# Patient Record
Sex: Male | Born: 1955 | Race: Black or African American | Hispanic: No | Smoking: Former smoker
Health system: Southern US, Community
[De-identification: ages and names within clinical notes are randomized; demographics above are authoritative.]

## PROBLEM LIST (undated history)

## (undated) DIAGNOSIS — E785 Hyperlipidemia, unspecified: Secondary | ICD-10-CM

## (undated) DIAGNOSIS — K649 Unspecified hemorrhoids: Secondary | ICD-10-CM

## (undated) DIAGNOSIS — Z87891 Personal history of nicotine dependence: Secondary | ICD-10-CM

## (undated) DIAGNOSIS — I2129 ST elevation (STEMI) myocardial infarction involving other sites: Secondary | ICD-10-CM

## (undated) DIAGNOSIS — I059 Rheumatic mitral valve disease, unspecified: Secondary | ICD-10-CM

## (undated) DIAGNOSIS — I5020 Unspecified systolic (congestive) heart failure: Secondary | ICD-10-CM

## (undated) DIAGNOSIS — T148XXA Other injury of unspecified body region, initial encounter: Secondary | ICD-10-CM

## (undated) DIAGNOSIS — I351 Nonrheumatic aortic (valve) insufficiency: Secondary | ICD-10-CM

## (undated) DIAGNOSIS — I255 Ischemic cardiomyopathy: Secondary | ICD-10-CM

## (undated) DIAGNOSIS — I739 Peripheral vascular disease, unspecified: Secondary | ICD-10-CM

## (undated) DIAGNOSIS — I2510 Atherosclerotic heart disease of native coronary artery without angina pectoris: Secondary | ICD-10-CM

## (undated) DIAGNOSIS — F329 Major depressive disorder, single episode, unspecified: Secondary | ICD-10-CM

## (undated) DIAGNOSIS — Z86718 Personal history of other venous thrombosis and embolism: Secondary | ICD-10-CM

## (undated) DIAGNOSIS — M109 Gout, unspecified: Secondary | ICD-10-CM

## (undated) DIAGNOSIS — R0600 Dyspnea, unspecified: Secondary | ICD-10-CM

## (undated) DIAGNOSIS — N183 Chronic kidney disease, stage 3 unspecified: Secondary | ICD-10-CM

## (undated) DIAGNOSIS — F209 Schizophrenia, unspecified: Secondary | ICD-10-CM

## (undated) DIAGNOSIS — R011 Cardiac murmur, unspecified: Secondary | ICD-10-CM

## (undated) DIAGNOSIS — I959 Hypotension, unspecified: Secondary | ICD-10-CM

## (undated) DIAGNOSIS — I10 Essential (primary) hypertension: Secondary | ICD-10-CM

## (undated) DIAGNOSIS — F3289 Other specified depressive episodes: Secondary | ICD-10-CM

## (undated) DIAGNOSIS — K219 Gastro-esophageal reflux disease without esophagitis: Secondary | ICD-10-CM

## (undated) HISTORY — DX: Major depressive disorder, single episode, unspecified: F32.9

## (undated) HISTORY — DX: Hypotension, unspecified: I95.9

## (undated) HISTORY — DX: Peripheral vascular disease, unspecified: I73.9

## (undated) HISTORY — DX: Gastro-esophageal reflux disease without esophagitis: K21.9

## (undated) HISTORY — DX: Essential (primary) hypertension: I10

## (undated) HISTORY — DX: Chronic kidney disease, stage 3 unspecified: N18.30

## (undated) HISTORY — DX: Unspecified systolic (congestive) heart failure: I50.20

## (undated) HISTORY — DX: Personal history of other venous thrombosis and embolism: Z86.718

## (undated) HISTORY — DX: Chronic kidney disease, stage 3 (moderate): N18.3

## (undated) HISTORY — DX: Gout, unspecified: M10.9

## (undated) HISTORY — PX: EYE SURGERY: SHX253

## (undated) HISTORY — DX: Hyperlipidemia, unspecified: E78.5

## (undated) HISTORY — DX: Atherosclerotic heart disease of native coronary artery without angina pectoris: I25.10

## (undated) HISTORY — PX: OTHER SURGICAL HISTORY: SHX169

## (undated) HISTORY — DX: Nonrheumatic aortic (valve) insufficiency: I35.1

## (undated) HISTORY — DX: Unspecified hemorrhoids: K64.9

## (undated) HISTORY — DX: Schizophrenia, unspecified: F20.9

## (undated) HISTORY — DX: Personal history of nicotine dependence: Z87.891

## (undated) HISTORY — DX: Other specified depressive episodes: F32.89

## (undated) HISTORY — DX: Other injury of unspecified body region, initial encounter: T14.8XXA

## (undated) HISTORY — DX: ST elevation (STEMI) myocardial infarction involving other sites: I21.29

## (undated) HISTORY — DX: Rheumatic mitral valve disease, unspecified: I05.9

## (undated) HISTORY — PX: COLONOSCOPY: SHX174

---

## 2011-02-20 DIAGNOSIS — I219 Acute myocardial infarction, unspecified: Secondary | ICD-10-CM

## 2011-02-25 DIAGNOSIS — I2129 ST elevation (STEMI) myocardial infarction involving other sites: Secondary | ICD-10-CM

## 2011-02-25 HISTORY — DX: ST elevation (STEMI) myocardial infarction involving other sites: I21.29

## 2011-03-07 ENCOUNTER — Encounter: Payer: Self-pay | Admitting: Cardiovascular Disease

## 2011-03-10 ENCOUNTER — Ambulatory Visit (INDEPENDENT_AMBULATORY_CARE_PROVIDER_SITE_OTHER): Payer: Medicare Other | Admitting: Cardiovascular Disease

## 2011-03-10 ENCOUNTER — Encounter: Payer: Self-pay | Admitting: Cardiovascular Disease

## 2011-03-10 DIAGNOSIS — I2510 Atherosclerotic heart disease of native coronary artery without angina pectoris: Secondary | ICD-10-CM

## 2011-03-10 DIAGNOSIS — E785 Hyperlipidemia, unspecified: Secondary | ICD-10-CM

## 2011-03-10 DIAGNOSIS — I10 Essential (primary) hypertension: Secondary | ICD-10-CM

## 2011-03-10 MED ORDER — CLONIDINE HCL 0.1 MG/24HR TD PTWK: 1.0000 | MEDICATED_PATCH | TRANSDERMAL | Status: DC

## 2011-03-10 NOTE — Patient Instructions (Signed)
Follow up as scheduled. Decrease Clonidine patch to 0.1 mg--change patch once per week. Stop Zocor (simvastatin) Stop Levitra. Cardiac Rehab referral

## 2011-03-11 ENCOUNTER — Encounter: Payer: Self-pay | Admitting: Cardiovascular Disease

## 2011-03-11 DIAGNOSIS — I2510 Atherosclerotic heart disease of native coronary artery without angina pectoris: Secondary | ICD-10-CM | POA: Insufficient documentation

## 2011-03-11 DIAGNOSIS — I10 Essential (primary) hypertension: Secondary | ICD-10-CM | POA: Insufficient documentation

## 2011-03-11 DIAGNOSIS — I059 Rheumatic mitral valve disease, unspecified: Secondary | ICD-10-CM | POA: Insufficient documentation

## 2011-03-11 DIAGNOSIS — E785 Hyperlipidemia, unspecified: Secondary | ICD-10-CM | POA: Insufficient documentation

## 2011-03-11 NOTE — Assessment & Plan Note (Signed)
The patient had a recent myocardial infarction with late is an inpatient. I elected to treat him medically without proceeding with cardiac catheterization due to chronic kidney disease and late presentation. His ejection fraction was 40-45% with moderate mitral regurgitation. The plan is to try to maximize his heart failure medications. I would like to get him off clonidine if possible in order to allow up titration of his other medications. His clonidine patch will be decreased to 0.1 mg once a week. He would come back for followup in a month and we'll try to up titrate the dose of hydralazine and Imdur were. Continue metoprolol at the current dose. I instructed him not to use Levitra or any other similar medications as he is on long-acting nitroglycerin. His renal function actually improved before hospital discharge to a creatinine less than 2. Thus, I will likely evaluate him with a nuclear stress test in the near future to see if he has a high ischemic burden to justify proceeding with cardiac catheterization. In the meantime, I asked him to start cardiac rehabilitation.

## 2011-03-11 NOTE — Progress Notes (Signed)
HPI  This is a 56 year old male who is here today for a followup visit. He presented recently to [facility] with weakness and dyspnea. He had chest pain 3-4 days before presentation. He was found to have non-ST elevation myocardial infarction with a troponin around 10 with normal CK-MB indicating subacute myocardial injury. His creatinine was 2.5 which was slightly above his baseline. He is known to have chronic kidney disease. He had an echocardiogram done which showed an ejection fraction of 40-45% with inferior and inferolateral wall hypokinesis with moderate mitral regurgitation. Due to his late presentation and chronic kidney disease, I recommended medical therapy. He was on high-dose Aldactone on presentation which was decreased to 25 mg once daily due to worsening renal function. Overall, he has been doing reasonably well. He had few short episodes of chest tightness . He continues to complain of exertional dyspnea and fatigue.  Allergies  Allergen Reactions  . Amlodipine   . Benazepril   . Haloperidol And Related   . Omeprazole      Current Outpatient Prescriptions on File Prior to Visit  Medication Sig Dispense Refill  . ARIPiprazole (ABILIFY) 20 MG tablet Take 20 mg by mouth 2 (two) times daily.      [name] aspirin EC 81 MG tablet Take 81 mg by mouth 2 (two) times daily.       [name] buPROPion (WELLBUTRIN) 100 MG tablet Take 100 mg by mouth 2 (two) times daily.      [name] LORazepam (ATIVAN) 1 MG tablet Take 1 mg by mouth every morning. & 2 by mouth at night      . metoprolol (LOPRESSOR) 100 MG tablet Take 100 mg by mouth 2 (two) times daily.      [name] OLANZapine (ZYPREXA) 10 MG tablet Take 50 mg by mouth at bedtime.       . pantoprazole (PROTONIX) 40 MG tablet Take 40 mg by mouth 2 (two) times daily.      . pravastatin (PRAVACHOL) 40 MG tablet Take 40 mg by mouth at bedtime.      [name] spironolactone (ALDACTONE) 25 MG tablet Take 25 mg by mouth daily.         Past Medical History  Diagnosis  Date  . Acute myocardial infarction, subendocardial infarction, initial episode of care   . Other primary cardiomyopathies   . Acute posthemorrhagic anemia   . Esophageal reflux   . Depressive disorder, not elsewhere classified   . Gout, unspecified   . Unspecified schizophrenia, unspecified condition   . Encounter for long-term (current) use of other medications   . Personal history of tobacco use, presenting hazards to health   . Hypotension, unspecified   . Mitral valve disorders     moderate MR  . Diseases of tricuspid valve   . CKD (chronic kidney disease) stage 3, GFR 30-59 ml/min   . Coronary atherosclerosis of native coronary artery   . Hypertension   . Other and unspecified hyperlipidemia      History reviewed. No pertinent past surgical history.   History reviewed. No pertinent family history.   History   Social History  . Marital Status: Legally Separated    Spouse Name: N/A    Number of Children: N/A  . Years of Education: N/A   Occupational History  . Not on file.   Social History Main Topics  . Smoking status: Former Smoker -- 1.0 packs/day for 23 years    Types: Cigarettes    Quit date: 02/25/1996  .  Smokeless tobacco: Never Used   Comment: + 15 years of smoking  . Alcohol Use: No  . Drug Use: No  . Sexually Active: Not on file   Other Topics Concern  . Not on file   Social History Narrative  . No narrative on file     PHYSICAL EXAM   BP 112/72  Pulse 82  Ht 5\' 8"  (1.727 m)  Wt 191 lb (86.637 kg)  BMI 29.04 kg/m2  Constitutional: He is oriented to person, place, and time. He appears well-developed and well-nourished. No distress.  HENT: No nasal discharge.  Head: Normocephalic and atraumatic.  Eyes: Pupils are equal, round, and reactive to light. Right eye exhibits no discharge. Left eye exhibits no discharge.  Neck: Normal range of motion. Neck supple. No JVD present. No thyromegaly present.  Cardiovascular: Normal rate, regular  rhythm, normal heart sounds and intact distal pulses. Exam reveals no gallop and no friction rub.  No murmur heard.  Pulmonary/Chest: Effort normal and breath sounds normal. No stridor. No respiratory distress. He has no wheezes. He has no rales. He exhibits no tenderness.  Abdominal: Soft. Bowel sounds are normal. He exhibits no distension. There is no tenderness. There is no rebound and no guarding.  Musculoskeletal: Normal range of motion. He exhibits no edema and no tenderness.  Neurological: He is alert and oriented to person, place, and time. Coordination normal.  Skin: Skin is warm and dry. No rash noted. He is not diaphoretic. No erythema. No pallor.  Psychiatric: He has a normal mood and affect. His behavior is normal. Judgment and thought content normal.      ASSESSMENT AND PLAN

## 2011-03-11 NOTE — Assessment & Plan Note (Signed)
His blood pressure is somewhat on the low side. The dose of clonidine will be decreased as discussed above.

## 2011-03-11 NOTE — Assessment & Plan Note (Signed)
The patient is taking both simvastatin and pravastatin for unclear reasons. Today I will stop simvastatin and leave him on pravastatin. He will need a followup lipid profile to determine if more aggressive statin treatment is needed.

## 2011-03-13 ENCOUNTER — Encounter (HOSPITAL_COMMUNITY): Payer: Self-pay | Admitting: *Deleted

## 2011-03-13 ENCOUNTER — Other Ambulatory Visit: Payer: Self-pay

## 2011-03-13 ENCOUNTER — Emergency Department (HOSPITAL_COMMUNITY): Payer: Medicare Other

## 2011-03-13 ENCOUNTER — Emergency Department (HOSPITAL_COMMUNITY)
Admission: EM | Admit: 2011-03-13 | Discharge: 2011-03-13 | Disposition: A | Discharge status: Home / Self Care | Payer: Medicare Other | Attending: Emergency Medicine | Admitting: Emergency Medicine

## 2011-03-13 DIAGNOSIS — R5381 Other malaise: Secondary | ICD-10-CM | POA: Insufficient documentation

## 2011-03-13 DIAGNOSIS — N183 Chronic kidney disease, stage 3 unspecified: Secondary | ICD-10-CM | POA: Insufficient documentation

## 2011-03-13 DIAGNOSIS — K219 Gastro-esophageal reflux disease without esophagitis: Secondary | ICD-10-CM | POA: Insufficient documentation

## 2011-03-13 DIAGNOSIS — I129 Hypertensive chronic kidney disease with stage 1 through stage 4 chronic kidney disease, or unspecified chronic kidney disease: Secondary | ICD-10-CM | POA: Insufficient documentation

## 2011-03-13 DIAGNOSIS — I252 Old myocardial infarction: Secondary | ICD-10-CM | POA: Insufficient documentation

## 2011-03-13 DIAGNOSIS — R5383 Other fatigue: Secondary | ICD-10-CM | POA: Insufficient documentation

## 2011-03-13 DIAGNOSIS — I2510 Atherosclerotic heart disease of native coronary artery without angina pectoris: Secondary | ICD-10-CM | POA: Insufficient documentation

## 2011-03-13 DIAGNOSIS — I059 Rheumatic mitral valve disease, unspecified: Secondary | ICD-10-CM | POA: Insufficient documentation

## 2011-03-13 DIAGNOSIS — R079 Chest pain, unspecified: Secondary | ICD-10-CM | POA: Insufficient documentation

## 2011-03-13 LAB — CBC
HCT: 37.6 % — ABNORMAL LOW (ref 39.0–52.0)
MCHC: 32.2 g/dL (ref 30.0–36.0)
Platelets: 163 10*3/uL (ref 150–400)
RDW: 15.1 % (ref 11.5–15.5)
WBC: 9 10*3/uL (ref 4.0–10.5)

## 2011-03-13 LAB — POCT I-STAT TROPONIN I: Troponin i, poc: 0.06 ng/mL (ref 0.00–0.08)

## 2011-03-13 LAB — BASIC METABOLIC PANEL
Chloride: 103 mEq/L (ref 96–112)
GFR calc Af Amer: 43 mL/min — ABNORMAL LOW (ref >90)
GFR calc non Af Amer: 37 mL/min — ABNORMAL LOW (ref >90)
Potassium: 4 mEq/L (ref 3.5–5.1)
Sodium: 140 mEq/L (ref 135–145)

## 2011-03-13 NOTE — ED Provider Notes (Signed)
History     CSN: [id number]  Arrival date & time 03/13/11  1339   First MD Initiated Contact with Patient 03/13/11 1430      Chief Complaint  Patient presents with  . Chest Pain    (Consider location/radiation/quality/duration/timing/severity/associated sxs/prior treatment) Patient is a 56 y.o. male presenting with chest pain.  Chest Pain The chest pain began 5 - 7 days ago. Chest pain occurs intermittently. The chest pain is unchanged. At its most intense, the pain is at 2/10. The pain is currently at 0/10. The severity of the pain is moderate. The quality of the pain is described as dull. The pain does not radiate. Primary symptoms include fatigue. Pertinent negatives for primary symptoms include no syncope, no shortness of breath, no cough, no palpitations, no abdominal pain, no nausea, no vomiting and no dizziness.  Pertinent negatives for associated symptoms include no diaphoresis and no near-syncope. He tried nothing for the symptoms.    patient has had pain like this now for several days was actually admitted the end of the December with a non-STEMI MI at [facility] in [facility]. Patient was actually seen by his cardiologist on the 14th for similar complaint and was okayed to be dismissed home. Today's pain is any different patient is concerned because his intermittent chest pain continues to occur its substernal and nonradiating.  Past Medical History  Diagnosis Date  . Acute myocardial infarction, subendocardial infarction, initial episode of care   . Other primary cardiomyopathies   . Esophageal reflux   . Depressive disorder, not elsewhere classified   . Gout, unspecified   . Unspecified schizophrenia, unspecified condition   . Encounter for long-term (current) use of other medications   . Personal history of tobacco use, presenting hazards to health   . Hypotension, unspecified   . Mitral valve disorders     moderate MR  . CKD (chronic kidney disease)  stage 3, GFR 30-59 ml/min   . Coronary atherosclerosis of native coronary artery   . Hypertension   . Other and unspecified hyperlipidemia     History reviewed. No pertinent past surgical history.  No family history on file.  History  Substance Use Topics  . Smoking status: Former Smoker -- 1.0 packs/day for 23 years    Types: Cigarettes    Quit date: 02/25/1996  . Smokeless tobacco: Never Used   Comment: + 15 years of smoking  . Alcohol Use: No      Review of Systems  Constitutional: Positive for fatigue. Negative for diaphoresis.  HENT: Negative for congestion, neck pain and neck stiffness.   Respiratory: Negative for cough and shortness of breath.   Cardiovascular: Positive for chest pain. Negative for palpitations, syncope and near-syncope.  Gastrointestinal: Negative for nausea, vomiting and abdominal pain.  Genitourinary: Negative for dysuria.  Musculoskeletal: Negative for back pain.  Neurological: Negative for dizziness, syncope and headaches.  Hematological: Does not bruise/bleed easily.    Allergies  Amlodipine; Benazepril; Haloperidol and related; and Omeprazole  Home Medications   Current Outpatient Rx  Name Route Sig Dispense Refill  . AMLODIPINE BESYLATE 10 MG PO TABS Oral Take 10 mg by mouth daily.    . ARIPIPRAZOLE 20 MG PO TABS Oral Take 20 mg by mouth 2 (two) times daily.    . ASPIRIN EC 81 MG PO TBEC Oral Take 81 mg by mouth 2 (two) times daily.     . BUPROPION HCL 100 MG PO TABS Oral Take 100 mg by mouth  2 (two) times daily.    [name] CLONIDINE HCL 0.1 MG/24HR TD PTWK Transdermal Place 1 patch onto the skin every 7 (seven) days. Patient changes patch on Sunday    . HYDRALAZINE HCL 25 MG PO TABS Oral Take 25 mg by mouth 2 (two) times daily.    . ISOSORBIDE MONONITRATE ER 30 MG PO TB24 Oral Take 30 mg by mouth daily.    [name] LORAZEPAM 1 MG PO TABS Oral Take 1-2 mg by mouth 2 (two) times daily. Patient takes 1 tablet in the morning & 2 tablets  at night    .  METOPROLOL TARTRATE 100 MG PO TABS Oral Take 100 mg by mouth 2 (two) times daily.    [name] OLANZAPINE 10 MG PO TABS Oral Take 50 mg by mouth at bedtime.     [name] PANTOPRAZOLE SODIUM 40 MG PO TBEC Oral Take 40 mg by mouth 2 (two) times daily.    [name] PRAVASTATIN SODIUM 40 MG PO TABS Oral Take 40 mg by mouth at bedtime.    . SPIRONOLACTONE 25 MG PO TABS Oral Take 25 mg by mouth daily.      BP 122/85  Pulse 86  Temp(Src) 97.7 F (36.5 C) (Oral)  Resp 16  SpO2 100%  Physical Exam  Nursing note and vitals reviewed. Constitutional: He is oriented to person, place, and time. He appears well-developed and well-nourished. No distress.  HENT:  Head: Normocephalic and atraumatic.  Mouth/Throat: Oropharynx is clear and moist.  Eyes: Conjunctivae and EOM are normal. Pupils are equal, round, and reactive to light.  Neck: Normal range of motion. Neck supple.  Cardiovascular: Normal rate and regular rhythm.   Pulmonary/Chest: Effort normal and breath sounds normal. No respiratory distress.  Abdominal: Soft. Bowel sounds are normal. There is no tenderness.  Musculoskeletal: Normal range of motion. He exhibits no edema.  Neurological: He is alert and oriented to person, place, and time. No cranial nerve deficit. Coordination normal.  Skin: Skin is warm. No rash noted.    ED Course  Procedures (including critical care time)  Labs Reviewed  CBC - Abnormal; Notable for the following:    RBC 4.04 (*)    Hemoglobin 12.1 (*)    HCT 37.6 (*)    All other components within normal limits  BASIC METABOLIC PANEL - Abnormal; Notable for the following:    Glucose, Bld 121 (*)    Creatinine, Ser 1.95 (*)    GFR calc non Af Amer 37 (*)    GFR calc Af Amer 43 (*)    All other components within normal limits  POCT I-STAT TROPONIN I  I-STAT TROPONIN I   Dg Chest 2 View  03/13/2011  *RADIOLOGY REPORT*  Clinical Data: High blood pressure, heart attack, chest pain  CHEST - 2 VIEW  Comparison: None.  Findings: There  is mild cardiomegaly.  The mediastinum and pulmonary vasculature are within normal limits.  Both lungs are clear.  IMPRESSION: Mild cardiomegaly.  Original Report Authenticated By: Duayne Cal, M.D.    Date: 03/13/2011  Rate: 94  Rhythm: normal sinus rhythm  QRS Axis: normal  Intervals: normal  ST/T Wave abnormalities: nonspecific T wave changes  Conduction Disutrbances:none  Narrative Interpretation:   Old EKG Reviewed: none available    1. Chest pain       MDM   Patient discussed with [facility] cardiology. Patient was admitted the end of December with a non-STEMI MI at [facility]. He was just seen by his  cardiologist in this group on the 14th of this month. He was having chest pain mild like today at that time. Today's workup including chest x-ray cardiac marker an EKG without any acute changes. Today's chest pain is been less than 15 minutes intermittent and recurrent but no worse has been for the past few days. Cardiology will contact him for earlier followup than the currently scheduled they will see him in the next 2 days.        Mervin Kung, MD 03/13/11 [phone]

## 2011-03-13 NOTE — ED Notes (Signed)
Pt had light heart attack 4-5 weeks ago--no cath.  Pt sts that he has not been sleeping and not had some of his meds for 3 days.  Pt is here with left chest pain that is worse today.  No sob, nausea, and diaphoresis.

## 2011-03-13 NOTE — ED Notes (Signed)
EKG completed in triage.

## 2011-03-13 NOTE — ED Notes (Signed)
Patient states onset 2 days ago intermittent left sided chest pain continued today 2/10 achy pressure. Airway intact bilateral equal chest rise and fall. Resting comfortably on stretcher.  Patient states suppose to take medication for blood pressure however does not.  Ax4 Family members at bedside. Cardiac monitor applied upon arrival to Bed assignment,

## 2011-03-14 ENCOUNTER — Other Ambulatory Visit: Payer: Self-pay | Admitting: Cardiovascular Disease

## 2011-03-14 ENCOUNTER — Telehealth: Payer: Self-pay | Admitting: *Deleted

## 2011-03-14 DIAGNOSIS — R072 Precordial pain: Secondary | ICD-10-CM

## 2011-03-14 NOTE — Telephone Encounter (Signed)
[place] set for 03-17-2011 @ [facility] Checking percert

## 2011-03-14 NOTE — Telephone Encounter (Signed)
Pt has Medicare and T[employer]are, no precert required.

## 2011-03-17 DIAGNOSIS — R079 Chest pain, unspecified: Secondary | ICD-10-CM

## 2011-03-18 ENCOUNTER — Ambulatory Visit: Payer: Medicare Other | Admitting: Cardiovascular Disease

## 2011-03-21 ENCOUNTER — Encounter: Payer: Self-pay | Admitting: Cardiovascular Disease

## 2011-03-21 ENCOUNTER — Ambulatory Visit (INDEPENDENT_AMBULATORY_CARE_PROVIDER_SITE_OTHER): Payer: Medicare Other | Admitting: Cardiovascular Disease

## 2011-03-21 DIAGNOSIS — I10 Essential (primary) hypertension: Secondary | ICD-10-CM

## 2011-03-21 DIAGNOSIS — E785 Hyperlipidemia, unspecified: Secondary | ICD-10-CM

## 2011-03-21 DIAGNOSIS — I059 Rheumatic mitral valve disease, unspecified: Secondary | ICD-10-CM

## 2011-03-21 DIAGNOSIS — I2510 Atherosclerotic heart disease of native coronary artery without angina pectoris: Secondary | ICD-10-CM

## 2011-03-21 MED ORDER — NITROGLYCERIN 0.4 MG SL SUBL: 0.4000 mg | SUBLINGUAL_TABLET | SUBLINGUAL | Status: DC | PRN

## 2011-03-21 MED ORDER — ISOSORBIDE MONONITRATE ER 60 MG PO TB24: 60.0000 mg | ORAL_TABLET | ORAL | Status: DC

## 2011-03-21 NOTE — Assessment & Plan Note (Signed)
The patient had a recent myocardial infarction with late presentation. I elected to treat him medically without proceeding with cardiac catheterization due to chronic kidney disease and late presentation. His ejection fraction was 40-45% with moderate mitral regurgitation. The plan is to try to maximize his heart failure medications. The patient had recent emergency room visit for brief chest pain. I scheduled him for a pharmacologic nuclear stress test which was done and showed evidence of a large infarct in the left circumflex distribution with only minimal peri-infarct ischemia. There was also a small area of reversibility in the anterior wall. His ejection fraction was 45%. I think the risk of proceeding with cardiac catheterization outweighs the benefit at this time. I recommend continuing medical therapy. Today, I will stop clonidine and increase the dose of Imdur to 60 mg once daily. I would also provide him with sublingual nitroglycerin to use as needed. I again encouraged him to start cardiac rehabilitation.

## 2011-03-21 NOTE — Assessment & Plan Note (Signed)
Continue Pravastatin. He will need lipid and liver profile during next visit.

## 2011-03-21 NOTE — Patient Instructions (Signed)
Follow up as scheduled. Stop Clonidine. Increase Imdur (isosorbide) to 60 mg daily. You can take 2 of your 30 mg tablets until gone and then get new prescription filled for 60 mg tablets and take 1 tablet daily. Nitroglycerin 0.4 mg. Place one tablet under tongue every 5 minutes up to 3 doses as needed for chest pain. No more than 3 doses over a 15 minute period.

## 2011-03-21 NOTE — Assessment & Plan Note (Signed)
His blood pressure is well-controlled 

## 2011-03-21 NOTE — Progress Notes (Addendum)
HPI  This is a 56 year old man who is here today for a followup visit. The patient had a recent large myocardial infarction. Unfortunately, he was a late Programmer, applications. Due to that, I did not pursue cardiac catheterization especially in the setting of chronic kidney disease. He was treated medically. During his last visit, I decreased the dose of clonidine patch with the plan to slowly get him off this medication. He had one episode of fluttering in his heart with mild tightness. He was very anxious and went to the emergency room at [facility]. His cardiac enzymes were negative. He has not had any further chest pain. Overall he feels reasonably well. He seems to be suffering from depression. He has significant psychiatric history with schizophrenia and multiple previous admissions for this. I decided to pursue a nuclear stress testing to see if there is significant amount of ischemia to warrant proceeding with cardiac catheterization.  Allergies  Allergen Reactions  . Amlodipine   . Benazepril   . Haloperidol And Related   . Omeprazole      Current Outpatient Prescriptions on File Prior to Visit  Medication Sig Dispense Refill  . amLODipine (NORVASC) 10 MG tablet Take 10 mg by mouth daily.      . ARIPiprazole (ABILIFY) 20 MG tablet Take 20 mg by mouth 2 (two) times daily.      [name] aspirin EC 81 MG tablet Take 81 mg by mouth 2 (two) times daily.       [name] buPROPion (WELLBUTRIN) 100 MG tablet Take 200 mg by mouth 2 (two) times daily.       [name] LORazepam (ATIVAN) 1 MG tablet Patient takes 1 tablet in the morning & 2 tablets  at night      . metoprolol (LOPRESSOR) 100 MG tablet Take 100 mg by mouth 2 (two) times daily.      [name] OLANZapine (ZYPREXA) 10 MG tablet Take 10 mg by mouth every morning. And 50 mg at bedtime      . pantoprazole (PROTONIX) 40 MG tablet Take 40 mg by mouth 2 (two) times daily.      [name] spironolactone (ALDACTONE) 25 MG tablet Take 25 mg by mouth daily.      . pravastatin  (PRAVACHOL) 40 MG tablet Take 40 mg by mouth at bedtime.         Past Medical History  Diagnosis Date  . Acute myocardial infarction, subendocardial infarction, initial episode of care   . Other primary cardiomyopathies   . Esophageal reflux   . Depressive disorder, not elsewhere classified   . Gout, unspecified   . Unspecified schizophrenia, unspecified condition   . Encounter for long-term (current) use of other medications   . Personal history of tobacco use, presenting hazards to health   . Hypotension, unspecified   . Mitral valve disorders     moderate MR  . CKD (chronic kidney disease) stage 3, GFR 30-59 ml/min   . Coronary atherosclerosis of native coronary artery   . Hypertension   . Other and unspecified hyperlipidemia      History reviewed. No pertinent past surgical history.   History reviewed. No pertinent family history.   History   Social History  . Marital Status: Legally Separated    Spouse Name: N/A    Number of Children: N/A  . Years of Education: N/A   Occupational History  . Not on file.   Social History Main Topics  . Smoking status: Former Smoker -- 1.0 packs/day for  23 years    Types: Cigarettes    Quit date: 02/25/1996  . Smokeless tobacco: Never Used   Comment: + 15 years of smoking  . Alcohol Use: No  . Drug Use: No  . Sexually Active: Not on file   Other Topics Concern  . Not on file   Social History Narrative  . No narrative on file     PHYSICAL EXAM   BP 117/70  Pulse 87  Ht 5\' 8"  (1.727 m)  Wt 191 lb (86.637 kg)  BMI 29.04 kg/m2  Constitutional: He is oriented to person, place, and time. He appears well-developed and well-nourished. No distress.  HENT: No nasal discharge.  Head: Normocephalic and atraumatic.  Eyes: Pupils are equal, round, and reactive to light. Right eye exhibits no discharge. Left eye exhibits no discharge.  Neck: Normal range of motion. Neck supple. No JVD present. No thyromegaly present.    Cardiovascular: Normal rate, regular rhythm, normal heart sounds and intact distal pulses. Exam reveals no gallop and no friction rub.  No murmur heard.  Pulmonary/Chest: Effort normal and breath sounds normal. No stridor. No respiratory distress. He has no wheezes. He has no rales. He exhibits no tenderness.  Abdominal: Soft. Bowel sounds are normal. He exhibits no distension. There is no tenderness. There is no rebound and no guarding.  Musculoskeletal: Normal range of motion. He exhibits no edema and no tenderness.  Neurological: He is alert and oriented to person, place, and time. Coordination normal.  Skin: Skin is warm and dry. No rash noted. He is not diaphoretic. No erythema. No pallor.  Psychiatric: His behavior is normal. Judgment and thought content normal.        ASSESSMENT AND PLAN

## 2011-03-21 NOTE — Assessment & Plan Note (Signed)
Moderate ischemic MR. Continue medical therapy.

## 2011-04-10 ENCOUNTER — Ambulatory Visit: Payer: Medicare Other | Admitting: Cardiovascular Disease

## 2011-04-22 ENCOUNTER — Ambulatory Visit (INDEPENDENT_AMBULATORY_CARE_PROVIDER_SITE_OTHER): Payer: Medicare Other | Admitting: Cardiovascular Disease

## 2011-04-22 ENCOUNTER — Encounter: Payer: Self-pay | Admitting: Cardiovascular Disease

## 2011-04-22 DIAGNOSIS — I2510 Atherosclerotic heart disease of native coronary artery without angina pectoris: Secondary | ICD-10-CM

## 2011-04-22 DIAGNOSIS — I059 Rheumatic mitral valve disease, unspecified: Secondary | ICD-10-CM

## 2011-04-22 DIAGNOSIS — I10 Essential (primary) hypertension: Secondary | ICD-10-CM

## 2011-04-22 DIAGNOSIS — E785 Hyperlipidemia, unspecified: Secondary | ICD-10-CM

## 2011-04-22 NOTE — Assessment & Plan Note (Signed)
Continue Pravastatin. He gets his labs done at the [place]. recommend a target LDL of less than 70. I provided him with a copy of his most recent office note to take with him to his primary care physician at the [place].

## 2011-04-22 NOTE — Progress Notes (Signed)
HPI  This is a 56 year old man who is here today for followup visit. He has known history of coronary artery disease status post myocardial infarction. He is being treated medically and did not have cardiac catheterization done due to chronic kidney disease (creatinine around 2.5). He had a nuclear stress test done which showed mostly a fixed defect consistent with an infarct without major ischemia. He is now attending cardiac rehabilitation 3 times a week and reports improved symptoms of dyspnea. His chest pain also is rare now. During his last visit, I increased the dose of Imdur to 60 mg once daily.  Allergies  Allergen Reactions  . Amlodipine   . Benazepril   . Haloperidol And Related   . Omeprazole      Current Outpatient Prescriptions on File Prior to Visit  Medication Sig Dispense Refill  . amLODipine (NORVASC) 10 MG tablet Take 10 mg by mouth daily.      . ARIPiprazole (ABILIFY) 20 MG tablet Take 20 mg by mouth 2 (two) times daily.      [name] aspirin EC 81 MG tablet Take 81 mg by mouth 2 (two) times daily.       [name] buPROPion (WELLBUTRIN) 100 MG tablet Take 200 mg by mouth 2 (two) times daily.       . hydrALAZINE (APRESOLINE) 100 MG tablet Take 100 mg by mouth 2 (two) times daily.      [name] HYDROcodone-acetaminophen (VICODIN) 5-500 MG per tablet Take 1 tablet by mouth 2 (two) times daily as needed.      . isosorbide mononitrate (IMDUR) 60 MG 24 hr tablet Take 1 tablet (60 mg total) by mouth every morning.  30 tablet  11  . LORazepam (ATIVAN) 1 MG tablet Patient takes 1 tablet in the morning & 2 tablets  at night      . metoprolol (LOPRESSOR) 100 MG tablet Take 100 mg by mouth 2 (two) times daily.      . nitroGLYCERIN (NITROSTAT) 0.4 MG SL tablet Place 1 tablet (0.4 mg total) under the tongue every 5 (five) minutes as needed for chest pain.  25 tablet  3  . OLANZapine (ZYPREXA) 10 MG tablet Take 10 mg by mouth every morning. And 50 mg at bedtime      . pantoprazole (PROTONIX) 40 MG tablet Take  40 mg by mouth 2 (two) times daily.      . ranitidine (ZANTAC) 150 MG tablet Take 150 mg by mouth 2 (two) times daily as needed.      [name] spironolactone (ALDACTONE) 25 MG tablet Take 25 mg by mouth daily.      . pravastatin (PRAVACHOL) 40 MG tablet Take 40 mg by mouth at bedtime.         Past Medical History  Diagnosis Date  . Acute myocardial infarction, subendocardial infarction, initial episode of care   . Other primary cardiomyopathies   . Esophageal reflux   . Depressive disorder, not elsewhere classified   . Gout, unspecified   . Unspecified schizophrenia, unspecified condition   . Encounter for long-term (current) use of other medications   . Personal history of tobacco use, presenting hazards to health   . Hypotension, unspecified   . Mitral valve disorders     moderate MR  . CKD (chronic kidney disease) stage 3, GFR 30-59 ml/min   . Coronary atherosclerosis of native coronary artery   . Hypertension   . Other and unspecified hyperlipidemia      History reviewed. No pertinent past  surgical history.   History reviewed. No pertinent family history.   History   Social History  . Marital Status: Legally Separated    Spouse Name: N/A    Number of Children: N/A  . Years of Education: N/A   Occupational History  . Not on file.   Social History Main Topics  . Smoking status: Former Smoker -- 1.0 packs/day for 23 years    Types: Cigarettes    Quit date: 02/25/1996  . Smokeless tobacco: Never Used   Comment: + 15 years of smoking  . Alcohol Use: No  . Drug Use: No  . Sexually Active: Not on file   Other Topics Concern  . Not on file   Social History Narrative  . No narrative on file     PHYSICAL EXAM   BP 138/82  Pulse 72  Resp 18  Ht 5\' 8"  (1.727 m)  Wt 200 lb 6.4 oz (90.901 kg)  BMI 30.47 kg/m2  Constitutional: He is oriented to person, place, and time. He appears well-developed and well-nourished. No distress.  HENT: No nasal discharge.  Head:  Normocephalic and atraumatic.  Eyes: Pupils are equal and round. Right eye exhibits no discharge. Left eye exhibits no discharge.  Neck: Normal range of motion. Neck supple. No JVD present. No thyromegaly present.  Cardiovascular: Normal rate, regular rhythm, normal heart sounds and. Exam reveals no gallop and no friction rub. No murmur heard.  Pulmonary/Chest: Effort normal and breath sounds normal. No stridor. No respiratory distress. He has no wheezes. He has no rales. He exhibits no tenderness.  Abdominal: Soft. Bowel sounds are normal. He exhibits no distension. There is no tenderness. There is no rebound and no guarding.  Musculoskeletal: Normal range of motion. He exhibits no edema and no tenderness.  Neurological: He is alert and oriented to person, place, and time. Coordination normal.  Skin: Skin is warm and dry. No rash noted. He is not diaphoretic. No erythema. No pallor.  Psychiatric: He has a normal mood and affect. His behavior is normal. Judgment and thought content normal.        ASSESSMENT AND PLAN

## 2011-04-22 NOTE — Assessment & Plan Note (Signed)
The patient had a myocardial infarction with late presentation. I elected to treat him medically without proceeding with cardiac catheterization due to chronic kidney disease and late presentation. His ejection fraction was 40-45% with moderate mitral regurgitation. The plan is to try to maximize his medications. Continue current medical therapy. Continue cardiac rehabilitation. Coronary angiography will be kept as a last resort for refractory angina or recurrent ischemic events.

## 2011-04-22 NOTE — Assessment & Plan Note (Signed)
His blood pressure is now well controlled. Continue current medications.

## 2011-04-22 NOTE — Assessment & Plan Note (Signed)
Moderate ischemic MR. Continue medical therapy.

## 2011-04-22 NOTE — Patient Instructions (Signed)
Your physician recommends that you schedule a follow-up appointment in: 6 months. You will receive a reminder letter in the mail about 1-2  Months in advance reminding you to call and schedule your appointment. If you don't receive this letter, please contact our office.  Your physician recommends that you continue on your current medications as directed. Please refer to the Current Medication list given to you today.

## 2011-09-30 ENCOUNTER — Ambulatory Visit (INDEPENDENT_AMBULATORY_CARE_PROVIDER_SITE_OTHER): Payer: Medicare Other | Admitting: Cardiology

## 2011-09-30 ENCOUNTER — Encounter: Payer: Self-pay | Admitting: Cardiology

## 2011-09-30 VITALS — BP 114/70 | HR 72 | Ht 68.0 in | Wt 208.4 lb

## 2011-09-30 DIAGNOSIS — I2510 Atherosclerotic heart disease of native coronary artery without angina pectoris: Secondary | ICD-10-CM

## 2011-09-30 DIAGNOSIS — I10 Essential (primary) hypertension: Secondary | ICD-10-CM

## 2011-09-30 NOTE — Progress Notes (Signed)
HPI The patient presents for followup of her cardiomyopathy with presumed coronary artery disease. He had a late presentation for myocardial infarction in the past. However, he did not have a cardiac catheterization as he had renal insufficiency. He does have a mildly reduced ejection fraction of 45% which may be related to his long-standing hypertension. His last creatinine was 1.9 and he is followed by a nephrologist at the [facility]. He was being followed by Dr. Fletcher Anon.  This is his first visit with me.  Since his last visit he has done well. He denies any chest pressure, neck or arm discomfort. He has had no shortness of breath, PND or orthopnea. He has no weight gain or edema. He's not doing as much exercise as he should but he does push a lawnmower. With this he might get some calf discomfort but he denies any other acute cardiovascular symptoms.   Allergies  Allergen Reactions  . Amlodipine   . Benazepril   . Haloperidol And Related   . Omeprazole     Current Outpatient Prescriptions  Medication Sig Dispense Refill  . amLODipine (NORVASC) 10 MG tablet Take 10 mg by mouth daily.      . ARIPiprazole (ABILIFY) 20 MG tablet Take 20 mg by mouth 2 (two) times daily.      [name] aspirin EC 81 MG tablet Take 81 mg by mouth 2 (two) times daily.       [name] buPROPion (WELLBUTRIN) 100 MG tablet Take 200 mg by mouth 2 (two) times daily.       . hydrALAZINE (APRESOLINE) 100 MG tablet Take 100 mg by mouth 2 (two) times daily.      [name] HYDROcodone-acetaminophen (VICODIN) 5-500 MG per tablet Take 1 tablet by mouth 2 (two) times daily as needed.      . isosorbide mononitrate (IMDUR) 60 MG 24 hr tablet Take 1 tablet (60 mg total) by mouth every morning.  30 tablet  11  . LORazepam (ATIVAN) 1 MG tablet Patient takes 1 tablet in the morning & 2 tablets  at night      . metoprolol (LOPRESSOR) 100 MG tablet Take 100 mg by mouth 2 (two) times daily.      . nitroGLYCERIN (NITROSTAT) 0.4 MG SL tablet Place 1 tablet (0.4 mg  total) under the tongue every 5 (five) minutes as needed for chest pain.  25 tablet  3  . OLANZapine (ZYPREXA) 10 MG tablet Take 50 mg by mouth at bedtime. And 50 mg at bedtime      . pantoprazole (PROTONIX) 40 MG tablet Take 40 mg by mouth 2 (two) times daily.      . pravastatin (PRAVACHOL) 40 MG tablet Take 40 mg by mouth at bedtime.      . ranitidine (ZANTAC) 150 MG tablet Take 150 mg by mouth 2 (two) times daily as needed.      . sertraline (ZOLOFT) 100 MG tablet Take 100 mg by mouth daily.      . simvastatin (ZOCOR) 80 MG tablet Take 80 mg by mouth at bedtime.      [name] spironolactone (ALDACTONE) 25 MG tablet Take 25 mg by mouth daily.      . vardenafil (LEVITRA) 20 MG tablet Take 20 mg by mouth daily as needed.        Past Medical History  Diagnosis Date  . Acute myocardial infarction, subendocardial infarction, initial episode of care   . Other primary cardiomyopathies   . Esophageal reflux   . Depressive  disorder, not elsewhere classified   . Gout, unspecified   . Unspecified schizophrenia, unspecified condition   . Encounter for long-term (current) use of other medications   . Personal history of tobacco use, presenting hazards to health   . Hypotension, unspecified   . Mitral valve disorders     moderate MR  . CKD (chronic kidney disease) stage 3, GFR 30-59 ml/min   . Coronary atherosclerosis of native coronary artery   . Hypertension   . Other and unspecified hyperlipidemia     Past Surgical History  Procedure Date  . None     ROS:  As stated in the HPI and negative for all other systems.  PHYSICAL EXAM BP 114/70  Pulse 72  Ht 5\' 8"  (1.727 m)  Wt 208 lb 6.4 oz (94.53 kg)  BMI 31.69 kg/m2 GENERAL:  Well appearing HEENT:  Pupils equal round and reactive, fundi not visualized, oral mucosa unremarkable NECK:  No jugular venous distention, waveform within normal limits, carotid upstroke brisk and symmetric, no bruits, no thyromegaly LUNGS:  Clear to auscultation  bilaterally BACK:  No CVA tenderness CHEST:  Unremarkable HEART:  PMI not displaced or sustained,S1 and S2 within normal limits, no S3, no S4, no clicks, no rubs, no murmurs ABD:  Flat, positive bowel sounds normal in frequency in pitch, no bruits, no rebound, no guarding, no midline pulsatile mass, no hepatomegaly, no splenomegaly EXT:  2 plus pulses throughout, no edema, no cyanosis no clubbing   ASSESSMENT AND PLAN  Coronary atherosclerosis of native coronary artery -  The patient had a myocardial infarction with late presentation. He was managed medically without proceeding with cardiac catheterization due to chronic kidney disease and late presentation. His ejection fraction was 40-45% with moderate mitral regurgitation. At this point no change in therapy is indicated and no further imaging is indicated.  I reviewed using a heart model his ejection fraction and mitral regurgitation.   Hypertension -  His blood pressure is now well controlled. Continue current medications.  Mitral valve disorders -  Moderate ischemic MR. Continue medical therapy. I will image this probably again in one year.   Other and unspecified hyperlipidemia -  Continue Pravastatin. this is followed at the [place] with a goal LDL of 70.

## 2011-09-30 NOTE — Patient Instructions (Signed)
   Lab:  BMET today  Office will contact with results Your physician wants you to follow up in: 6 months.  You will receive a reminder letter in the mail one-two months in advance.  If you don't receive a letter, please call our office to schedule the follow up appointment

## 2011-10-07 ENCOUNTER — Telehealth: Payer: Self-pay | Admitting: *Deleted

## 2011-10-07 DIAGNOSIS — I10 Essential (primary) hypertension: Secondary | ICD-10-CM

## 2011-10-07 NOTE — Telephone Encounter (Signed)
Message copied by Merlene Laughter on Tue Oct 07, 2011 10:43 AM ------      Message from: Laurine Blazer      Created: Fri Oct 03, 2011  9:38 AM                   ----- Message -----         From: Ellwood Dense, RN         Sent: 10/02/2011   4:39 PM           To: Laurine Blazer, LPN                        ----- Message -----         From: Minus Breeding, MD         Sent: 10/02/2011  11:39 AM           To: Ellwood Dense, RN            Stop spironolactone.  Repeat BMET next week.

## 2011-10-07 NOTE — Telephone Encounter (Signed)
Patient informed and lab order faxed to [facility] lab.

## 2011-10-22 ENCOUNTER — Telehealth: Payer: Self-pay | Admitting: *Deleted

## 2011-10-22 DIAGNOSIS — I10 Essential (primary) hypertension: Secondary | ICD-10-CM

## 2011-10-22 NOTE — Telephone Encounter (Signed)
Patient informed. Lab order faxed to [facility].

## 2011-10-22 NOTE — Telephone Encounter (Signed)
Message copied by Merlene Laughter on Wed Oct 22, 2011  1:38 PM ------      Message from: Minus Breeding      Created: Mon Oct 20, 2011  1:31 PM       Repeat in one week.

## 2011-10-22 NOTE — Addendum Note (Signed)
Addended by: Merlene Laughter on: 10/22/2011 01:40 PM   Modules accepted: Orders

## 2011-10-30 ENCOUNTER — Telehealth: Payer: Self-pay | Admitting: *Deleted

## 2011-10-30 DIAGNOSIS — I10 Essential (primary) hypertension: Secondary | ICD-10-CM

## 2011-10-30 NOTE — Telephone Encounter (Signed)
Message copied by Merlene Laughter on Thu Oct 30, 2011  1:18 PM ------      Message from: Minus Breeding      Created: Tue Oct 28, 2011 11:34 AM       Creat is better than previous since spironolactone has been stopped.   Repeat in 6 weeks.  Call Mr. Laduca with the results and send results to VYAS,DHRUV B., MD

## 2011-10-30 NOTE — Telephone Encounter (Signed)
Patient informed. Copy sent to Dr. Woody Seller office. Orderd faxed to [facility] lab.

## 2011-11-13 ENCOUNTER — Telehealth: Payer: Self-pay | Admitting: Cardiovascular Disease

## 2011-12-30 ENCOUNTER — Telehealth: Payer: Self-pay | Admitting: *Deleted

## 2011-12-30 NOTE — Telephone Encounter (Signed)
Message copied by Merlene Laughter on Tue Dec 30, 2011 11:15 AM ------      Message from: Minus Breeding      Created: Mon Dec 29, 2011 11:35 AM       Creat is slightly better than previous.  Call Mr. Sieloff with the results and send results to VYAS,DHRUV B., MD

## 2011-12-30 NOTE — Telephone Encounter (Signed)
Patient informed. 

## 2012-04-13 ENCOUNTER — Other Ambulatory Visit: Payer: Self-pay | Admitting: *Deleted

## 2012-04-13 MED ORDER — ISOSORBIDE MONONITRATE ER 60 MG PO TB24: 60.0000 mg | ORAL_TABLET | ORAL | Status: DC

## 2012-04-23 ENCOUNTER — Ambulatory Visit (INDEPENDENT_AMBULATORY_CARE_PROVIDER_SITE_OTHER): Payer: Medicare Other | Admitting: Cardiology

## 2012-04-23 ENCOUNTER — Encounter: Payer: Self-pay | Admitting: Cardiology

## 2012-04-23 VITALS — BP 129/68 | HR 87 | Ht 68.0 in | Wt 220.0 lb

## 2012-04-23 DIAGNOSIS — I2510 Atherosclerotic heart disease of native coronary artery without angina pectoris: Secondary | ICD-10-CM

## 2012-04-23 DIAGNOSIS — E785 Hyperlipidemia, unspecified: Secondary | ICD-10-CM

## 2012-04-23 DIAGNOSIS — I25810 Atherosclerosis of coronary artery bypass graft(s) without angina pectoris: Secondary | ICD-10-CM

## 2012-04-23 DIAGNOSIS — I059 Rheumatic mitral valve disease, unspecified: Secondary | ICD-10-CM

## 2012-04-23 DIAGNOSIS — I10 Essential (primary) hypertension: Secondary | ICD-10-CM

## 2012-04-23 NOTE — Progress Notes (Signed)
HPI The patient presents for followup of her cardiomyopathy with presumed coronary artery disease. He had a late presentation for myocardial infarction in the past. However, he did not have a cardiac catheterization as he had renal insufficiency. He does have a mildly reduced ejection fraction of 45% which may be related to his long-standing hypertension.   Since his last visit he has done well. He denies any new chest pressure, neck or arm discomfort. He has had no shortness of breath, PND or orthopnea. He has no edema. He's not doing as much exercise. He has gained weight which he reports is related to his medications.  He is eating more as he is alone and separated from his wife.  He has had no new symptoms since his Myoview last year.   Allergies  Allergen Reactions  . Amlodipine   . Benazepril   . Haloperidol And Related   . Omeprazole     Current Outpatient Prescriptions  Medication Sig Dispense Refill  . amLODipine (NORVASC) 10 MG tablet Take 10 mg by mouth daily.      . ARIPiprazole (ABILIFY) 20 MG tablet Take 20 mg by mouth 2 (two) times daily.      [name] aspirin EC 81 MG tablet Take 81 mg by mouth 2 (two) times daily.       [name] buPROPion (WELLBUTRIN) 100 MG tablet Take 200 mg by mouth 2 (two) times daily.       . hydrALAZINE (APRESOLINE) 100 MG tablet Take 100 mg by mouth 2 (two) times daily.      [name] HYDROcodone-acetaminophen (VICODIN) 5-500 MG per tablet Take 1 tablet by mouth 2 (two) times daily as needed.      . isosorbide mononitrate (IMDUR) 60 MG 24 hr tablet Take 1 tablet (60 mg total) by mouth every morning.  30 tablet  6  . LORazepam (ATIVAN) 1 MG tablet Patient takes 1 tablet in the morning & 2 tablets  at night      . metoprolol (LOPRESSOR) 100 MG tablet Take 100 mg by mouth 2 (two) times daily.      . nitroGLYCERIN (NITROSTAT) 0.4 MG SL tablet Place 1 tablet (0.4 mg total) under the tongue every 5 (five) minutes as needed for chest pain.  25 tablet  3  . OLANZapine  (ZYPREXA) 10 MG tablet Take 50 mg by mouth at bedtime. And 50 mg at bedtime      . pantoprazole (PROTONIX) 40 MG tablet Take 40 mg by mouth 2 (two) times daily.      . pravastatin (PRAVACHOL) 40 MG tablet Take 20 mg by mouth at bedtime.       . ranitidine (ZANTAC) 150 MG tablet Take 150 mg by mouth 2 (two) times daily as needed.      . sertraline (ZOLOFT) 100 MG tablet Take 100 mg by mouth daily.      . simvastatin (ZOCOR) 80 MG tablet Take 80 mg by mouth at bedtime.      . vardenafil (LEVITRA) 20 MG tablet Take 20 mg by mouth daily as needed.       No current facility-administered medications for this visit.    Past Medical History  Diagnosis Date  . Acute myocardial infarction, subendocardial infarction, initial episode of care   . Other primary cardiomyopathies   . Esophageal reflux   . Depressive disorder, not elsewhere classified   . Gout, unspecified   . Unspecified schizophrenia, unspecified condition   . Encounter for long-term (current) use of  other medications   . Personal history of tobacco use, presenting hazards to health   . Hypotension, unspecified   . Mitral valve disorders     moderate MR  . CKD (chronic kidney disease) stage 3, GFR 30-59 ml/min   . Coronary atherosclerosis of native coronary artery   . Hypertension   . Other and unspecified hyperlipidemia     Past Surgical History  Procedure Laterality Date  . None      ROS:  As stated in the HPI and negative for all other systems.  PHYSICAL EXAM BP 129/68  Pulse 87  Ht 5\' 8"  (1.727 m)  Wt 220 lb (99.791 kg)  BMI 33.46 kg/m2 GENERAL:  Well appearing HEENT:  Pupils equal round and reactive, fundi not visualized, oral mucosa unremarkable NECK:  No jugular venous distention, waveform within normal limits, carotid upstroke brisk and symmetric, no bruits, no thyromegaly LUNGS:  Clear to auscultation bilaterally BACK:  No CVA tenderness CHEST:  Unremarkable HEART:  PMI not displaced or sustained,S1 and S2  within normal limits, no S3, no S4, no clicks, no rubs, no murmurs ABD:  Flat, positive bowel sounds normal in frequency in pitch, no bruits, no rebound, no guarding, no midline pulsatile mass, no hepatomegaly, no splenomegaly EXT:  2 plus pulses throughout, no edema, no cyanosis no clubbing  EKG:  NSR, rate 87 axis WNL, early transition, no acute ST T wave changes. 04/23/2012  ASSESSMENT AND PLAN  Coronary atherosclerosis of native coronary artery -  The patient had a myocardial infarction with late presentation. He was managed medically without proceeding with cardiac catheterization due to chronic kidney disease and late presentation. His ejection fraction was 40-45% with moderate mitral regurgitation by echo but higher by nuclear. He has had no new chest pain.  At this point no change in therapy is indicated and no further imaging is indicated.  I reviewed again using a heart model his ejection fraction and mitral regurgitation. He does not recall the previous conversation.  Hypertension -  His blood pressure is now well controlled. Continue current medications.  Mitral valve disorders -  Moderate ischemic MR. I will schedule a follow up echocardiogram.  Other and unspecified hyperlipidemia -  Continue Pravastatin. this is followed at the [place].  Obesity - He has gain 29 lbs in one year.  The patient understands the need to lose weight with diet and exercise. We have discussed specific strategies for this.

## 2012-04-23 NOTE — Patient Instructions (Addendum)

## 2012-05-13 ENCOUNTER — Other Ambulatory Visit (INDEPENDENT_AMBULATORY_CARE_PROVIDER_SITE_OTHER): Payer: Medicare Other

## 2012-05-13 ENCOUNTER — Other Ambulatory Visit: Payer: Self-pay

## 2012-05-13 DIAGNOSIS — I059 Rheumatic mitral valve disease, unspecified: Secondary | ICD-10-CM

## 2012-05-13 DIAGNOSIS — I2510 Atherosclerotic heart disease of native coronary artery without angina pectoris: Secondary | ICD-10-CM

## 2012-05-28 ENCOUNTER — Telehealth: Payer: Self-pay | Admitting: *Deleted

## 2012-05-28 NOTE — Telephone Encounter (Signed)
Patient informed. 

## 2012-05-28 NOTE — Telephone Encounter (Signed)
Message copied by Merlene Laughter on Fri May 28, 2012  4:14 PM ------      Message from: FLEMING, PAMELA J      Created: Wed May 26, 2012  3:51 PM                   ----- Message -----         From: Minus Breeding, MD         Sent: 05/23/2012   4:08 PM           To: Ellwood Dense, RN            EF is mildly reduced consistent with previous studies and previous history.  No decrease in function compared with previous.  MR is only mild.  No further work up at this point. Continue with secondary risk reduction. ------

## 2012-11-09 ENCOUNTER — Ambulatory Visit (INDEPENDENT_AMBULATORY_CARE_PROVIDER_SITE_OTHER): Payer: Medicare Other | Admitting: Cardiovascular Disease

## 2012-11-09 ENCOUNTER — Encounter: Payer: Self-pay | Admitting: Cardiovascular Disease

## 2012-11-09 VITALS — BP 127/77 | HR 78 | Ht 68.0 in | Wt 217.0 lb

## 2012-11-09 DIAGNOSIS — I10 Essential (primary) hypertension: Secondary | ICD-10-CM

## 2012-11-09 DIAGNOSIS — E785 Hyperlipidemia, unspecified: Secondary | ICD-10-CM

## 2012-11-09 DIAGNOSIS — I2510 Atherosclerotic heart disease of native coronary artery without angina pectoris: Secondary | ICD-10-CM

## 2012-11-09 DIAGNOSIS — I059 Rheumatic mitral valve disease, unspecified: Secondary | ICD-10-CM

## 2012-11-09 MED ORDER — ISOSORBIDE MONONITRATE ER 60 MG PO TB24: 60.0000 mg | ORAL_TABLET | ORAL | Status: DC

## 2012-11-09 NOTE — Assessment & Plan Note (Signed)
The patient had previous myocardial infarction with late presentation likely in the left circumflex distribution. He has been treated medically since then. Continue current medications. I explained to him that interaction between nitroglycerin and phosphodiesterase inhibitors. He had significant improvement in his symptoms after the addition of isosorbide and thus I'm hesitant to stop this medication.

## 2012-11-09 NOTE — Assessment & Plan Note (Signed)
I reviewed his most recent lipid profile which showed a total cholesterol of 213, triglyceride of 220 , HDL of 43 and an LDL of 128. I discussed with him the importance of improving his diet and exercise. Consider switching from simvastatin to atorvastatin to obtain an LDL level of less than 100.

## 2012-11-09 NOTE — Patient Instructions (Addendum)
Continue same medications.  Follow up in 6 months.  

## 2012-11-09 NOTE — Progress Notes (Signed)
HPI  This is a 57 year old man who is here today for followup visit. He has known history of coronary artery disease status post myocardial infarction with late presentation. He is being treated medically and did not have cardiac catheterization done due to chronic kidney disease (creatinine around 2.5) and late presentation. He had a nuclear stress test in January of 2013  showed mostly a large transmural infarct in the left circumflex distribution without significant ischemia. EF was 40-45% with moderate mitral regurgitation initially. Most recent echocardiogram in March 2014 showed an ejection fraction of 40% with mild mitral regurgitation. He has been stable on medical therapy. He denies any chest pain. He has stable exertional dyspnea. Weight gain continues to be an issue for him. Most recent creatinine was 2.6.  Allergies  Allergen Reactions  . Amlodipine   . Benazepril   . Haloperidol And Related   . Omeprazole      Current Outpatient Prescriptions on File Prior to Visit  Medication Sig Dispense Refill  . amLODipine (NORVASC) 10 MG tablet Take 10 mg by mouth daily.      . ARIPiprazole (ABILIFY) 20 MG tablet Take 20 mg by mouth 2 (two) times daily.      [name] aspirin EC 81 MG tablet Take 81 mg by mouth 2 (two) times daily.       [name] buPROPion (WELLBUTRIN) 100 MG tablet Take 200 mg by mouth 2 (two) times daily.       . hydrALAZINE (APRESOLINE) 100 MG tablet Take 100 mg by mouth 2 (two) times daily.      [name] HYDROcodone-acetaminophen (VICODIN) 5-500 MG per tablet Take 1 tablet by mouth 2 (two) times daily as needed.      . isosorbide mononitrate (IMDUR) 60 MG 24 hr tablet Take 1 tablet (60 mg total) by mouth every morning.  30 tablet  6  . LORazepam (ATIVAN) 1 MG tablet Patient takes 1 tablet in the morning & 2 tablets  at night      . metoprolol (LOPRESSOR) 100 MG tablet Take 100 mg by mouth 2 (two) times daily.      . nitroGLYCERIN (NITROSTAT) 0.4 MG SL tablet Place 1 tablet (0.4 mg total)  under the tongue every 5 (five) minutes as needed for chest pain.  25 tablet  3  . OLANZapine (ZYPREXA) 10 MG tablet Take 50 mg by mouth at bedtime. And 50 mg at bedtime      . pantoprazole (PROTONIX) 40 MG tablet Take 40 mg by mouth 2 (two) times daily.      . pravastatin (PRAVACHOL) 40 MG tablet Take 20 mg by mouth at bedtime.       . ranitidine (ZANTAC) 150 MG tablet Take 150 mg by mouth 2 (two) times daily as needed.      . sertraline (ZOLOFT) 100 MG tablet Take 100 mg by mouth daily.      . simvastatin (ZOCOR) 80 MG tablet Take 80 mg by mouth at bedtime.      . vardenafil (LEVITRA) 20 MG tablet Take 10 mg by mouth daily as needed.        No current facility-administered medications on file prior to visit.     Past Medical History  Diagnosis Date  . Acute myocardial infarction, subendocardial infarction, initial episode of care   . Other primary cardiomyopathies   . Esophageal reflux   . Depressive disorder, not elsewhere classified   . Gout, unspecified   . Unspecified schizophrenia, unspecified condition   .  Encounter for long-term (current) use of other medications   . Personal history of tobacco use, presenting hazards to health   . Hypotension, unspecified   . Mitral valve disorders     moderate MR  . CKD (chronic kidney disease) stage 3, GFR 30-59 ml/min   . Coronary atherosclerosis of native coronary artery   . Hypertension   . Other and unspecified hyperlipidemia      Past Surgical History  Procedure Laterality Date  . None    . Colonoscopy       Family History  Problem Relation Age of Onset  . Family history unknown: Yes     History   Social History  . Marital Status: Legally Separated    Spouse Name: N/A    Number of Children: N/A  . Years of Education: N/A   Occupational History  . Not on file.   Social History Main Topics  . Smoking status: Former Smoker -- 1.00 packs/day for 23 years    Types: Cigarettes    Quit date: 02/25/1996  .  Smokeless tobacco: Never Used     Comment: + 15 years of smoking  . Alcohol Use: No  . Drug Use: No  . Sexual Activity: Not on file   Other Topics Concern  . Not on file   Social History Narrative  . No narrative on file     PHYSICAL EXAM   BP 127/77  Pulse 78  Ht 5\' 8"  (1.727 m)  Wt 217 lb (98.431 kg)  BMI 33 kg/m2  Constitutional: He is oriented to person, place, and time. He appears well-developed and well-nourished. No distress.  HENT: No nasal discharge.  Head: Normocephalic and atraumatic.  Eyes: Pupils are equal and round. Right eye exhibits no discharge. Left eye exhibits no discharge.  Neck: Normal range of motion. Neck supple. No JVD present. No thyromegaly present.  Cardiovascular: Normal rate, regular rhythm, normal heart sounds and. Exam reveals no gallop and no friction rub. No murmur heard.  Pulmonary/Chest: Effort normal and breath sounds normal. No stridor. No respiratory distress. He has no wheezes. He has no rales. He exhibits no tenderness.  Abdominal: Soft. Bowel sounds are normal. He exhibits no distension. There is no tenderness. There is no rebound and no guarding.  Musculoskeletal: Normal range of motion. He exhibits no edema and no tenderness.  Neurological: He is alert and oriented to person, place, and time. Coordination normal.  Skin: Skin is warm and dry. No rash noted. He is not diaphoretic. No erythema. No pallor.  Psychiatric: He has a normal mood and affect. His behavior is normal. Judgment and thought content normal.     EKG: Normal sinus rhythm with LVH and nonspecific T wave changes.   ASSESSMENT AND PLAN

## 2012-11-09 NOTE — Assessment & Plan Note (Signed)
Most recent echo showed only mild mitral regurgitation.

## 2012-11-09 NOTE — Assessment & Plan Note (Signed)
Blood pressure is well controlled on current medications. 

## 2013-03-22 NOTE — Telephone Encounter (Signed)
No notes

## 2013-06-07 ENCOUNTER — Ambulatory Visit (INDEPENDENT_AMBULATORY_CARE_PROVIDER_SITE_OTHER): Payer: Medicare Other | Admitting: Cardiovascular Disease

## 2013-06-07 ENCOUNTER — Encounter: Payer: Self-pay | Admitting: Cardiovascular Disease

## 2013-06-07 ENCOUNTER — Encounter (INDEPENDENT_AMBULATORY_CARE_PROVIDER_SITE_OTHER): Payer: Self-pay

## 2013-06-07 VITALS — BP 134/82 | HR 80 | Ht 69.0 in | Wt 223.0 lb

## 2013-06-07 DIAGNOSIS — E785 Hyperlipidemia, unspecified: Secondary | ICD-10-CM

## 2013-06-07 DIAGNOSIS — I059 Rheumatic mitral valve disease, unspecified: Secondary | ICD-10-CM

## 2013-06-07 DIAGNOSIS — I2589 Other forms of chronic ischemic heart disease: Secondary | ICD-10-CM

## 2013-06-07 DIAGNOSIS — I10 Essential (primary) hypertension: Secondary | ICD-10-CM

## 2013-06-07 DIAGNOSIS — I255 Ischemic cardiomyopathy: Secondary | ICD-10-CM

## 2013-06-07 DIAGNOSIS — I2510 Atherosclerotic heart disease of native coronary artery without angina pectoris: Secondary | ICD-10-CM

## 2013-06-07 MED ORDER — ATORVASTATIN CALCIUM 40 MG PO TABS: 40.0000 mg | ORAL_TABLET | Freq: Every day | ORAL | Status: DC

## 2013-06-07 NOTE — Assessment & Plan Note (Signed)
Blood pressures and excellent control on current medications.

## 2013-06-07 NOTE — Assessment & Plan Note (Signed)
He is doing very well with no symptoms suggestive of angina. Continue medical therapy. 

## 2013-06-07 NOTE — Assessment & Plan Note (Signed)
Most recent echocardiogram showed mild mitral regurgitation.

## 2013-06-07 NOTE — Assessment & Plan Note (Signed)
Most recent lipid profile which showed a total cholesterol of 213, triglyceride of 220 , HDL of 43 and an LDL of 128.  I switched simvastatin to atorvastatin 40 mg once daily. Check fasting lipid and liver profile in 6 weeks.

## 2013-06-07 NOTE — Assessment & Plan Note (Signed)
Most recent ejection fraction was 40%. He has no symptoms of heart failure.

## 2013-06-07 NOTE — Progress Notes (Signed)
HPI  This is a 58 year old man who is here today for followup visit. He has known history of coronary artery disease status post myocardial infarction with late presentation. He is being treated medically and did not have cardiac catheterization done due to chronic kidney disease (creatinine around 2.5) and late presentation. He had a nuclear stress test in January of 2013 which showed mostly a large transmural infarct in the left circumflex distribution without significant ischemia. EF was 40-45% with moderate mitral regurgitation initially. Most recent echocardiogram in March 2014 showed an ejection fraction of 40% with mild mitral regurgitation. He has been stable on medical therapy. He denies any chest pain. He has stable exertional dyspnea.   Allergies  Allergen Reactions  . Amlodipine   . Benazepril   . Haloperidol And Related   . Omeprazole      Current Outpatient Prescriptions on File Prior to Visit  Medication Sig Dispense Refill  . amLODipine (NORVASC) 10 MG tablet Take 10 mg by mouth daily.      . ARIPiprazole (ABILIFY) 20 MG tablet Take 20 mg by mouth 2 (two) times daily.      [name] aspirin EC 81 MG tablet Take 81 mg by mouth 2 (two) times daily.       [name] buPROPion (WELLBUTRIN) 100 MG tablet Take 200 mg by mouth 2 (two) times daily.       . hydrALAZINE (APRESOLINE) 100 MG tablet Take 100 mg by mouth 2 (two) times daily.      [name] HYDROcodone-acetaminophen (VICODIN) 5-500 MG per tablet Take 1 tablet by mouth 2 (two) times daily as needed.      . isosorbide mononitrate (IMDUR) 60 MG 24 hr tablet Take 1 tablet (60 mg total) by mouth every morning.  30 tablet  6  . LORazepam (ATIVAN) 1 MG tablet Take 2 mg by mouth at bedtime.       . metoprolol (LOPRESSOR) 100 MG tablet Take 100 mg by mouth 2 (two) times daily.      . nitroGLYCERIN (NITROSTAT) 0.4 MG SL tablet Place 1 tablet (0.4 mg total) under the tongue every 5 (five) minutes as needed for chest pain.  25 tablet  3  . ranitidine  (ZANTAC) 150 MG tablet Take 150 mg by mouth 2 (two) times daily as needed.      . sertraline (ZOLOFT) 100 MG tablet Take 150 mg by mouth daily.        No current facility-administered medications on file prior to visit.     Past Medical History  Diagnosis Date  . Acute myocardial infarction, subendocardial infarction, initial episode of care   . Other primary cardiomyopathies   . Esophageal reflux   . Depressive disorder, not elsewhere classified   . Gout, unspecified   . Unspecified schizophrenia, unspecified condition   . Encounter for long-term (current) use of other medications   . Personal history of tobacco use, presenting hazards to health   . Hypotension, unspecified   . Mitral valve disorders     moderate MR  . CKD (chronic kidney disease) stage 3, GFR 30-59 ml/min   . Coronary atherosclerosis of native coronary artery   . Hypertension   . Other and unspecified hyperlipidemia   . Torn ligament      Past Surgical History  Procedure Laterality Date  . None    . Colonoscopy       Family History  Problem Relation Age of Onset  . Family history unknown: Yes  History   Social History  . Marital Status: Legally Separated    Spouse Name: N/A    Number of Children: N/A  . Years of Education: N/A   Occupational History  . Not on file.   Social History Main Topics  . Smoking status: Former Smoker -- 1.00 packs/day for 23 years    Types: Cigarettes    Quit date: 02/25/1996  . Smokeless tobacco: Never Used     Comment: + 15 years of smoking  . Alcohol Use: No  . Drug Use: No  . Sexual Activity: Not on file   Other Topics Concern  . Not on file   Social History Narrative  . No narrative on file     PHYSICAL EXAM   BP 134/82  Pulse 80  Ht 5\' 9"  (1.753 m)  Wt 223 lb (101.152 kg)  BMI 32.92 kg/m2  Constitutional: He is oriented to person, place, and time. He appears well-developed and well-nourished. No distress.  HENT: No nasal discharge.    Head: Normocephalic and atraumatic.  Eyes: Pupils are equal and round. Right eye exhibits no discharge. Left eye exhibits no discharge.  Neck: Normal range of motion. Neck supple. No JVD present. No thyromegaly present.  Cardiovascular: Normal rate, regular rhythm, normal heart sounds and. Exam reveals no gallop and no friction rub. No murmur heard.  Pulmonary/Chest: Effort normal and breath sounds normal. No stridor. No respiratory distress. He has no wheezes. He has no rales. He exhibits no tenderness.  Abdominal: Soft. Bowel sounds are normal. He exhibits no distension. There is no tenderness. There is no rebound and no guarding.  Musculoskeletal: Normal range of motion. He exhibits no edema and no tenderness.  Neurological: He is alert and oriented to person, place, and time. Coordination normal.  Skin: Skin is warm and dry. No rash noted. He is not diaphoretic. No erythema. No pallor.  Psychiatric: He has a normal mood and affect. His behavior is normal. Judgment and thought content normal.     EKG: Normal sinus rhythm with LVH and nonspecific T wave changes.   ASSESSMENT AND PLAN

## 2013-06-07 NOTE — Patient Instructions (Signed)
Stop taking Simvastatin.   Start Atorvastatin 40 mg once daily.   Check fasting lipid and liver profile in 6 weeks.   Your physician wants you to follow-up in: 6 months.  You will receive a reminder letter in the mail two months in advance. If you don't receive a letter, please call our office to schedule the follow-up appointment.

## 2013-07-04 ENCOUNTER — Telehealth: Payer: Self-pay

## 2013-07-04 ENCOUNTER — Other Ambulatory Visit: Payer: Self-pay | Admitting: Cardiovascular Disease

## 2013-07-04 MED ORDER — SIMVASTATIN 40 MG PO TABS: 40.0000 mg | ORAL_TABLET | Freq: Every day | ORAL | Status: DC

## 2013-07-04 NOTE — Telephone Encounter (Signed)
Informed patient of Dr. Jacklynn Ganong response Patient verbalized understanding

## 2013-07-04 NOTE — Telephone Encounter (Signed)
Pt called stating that his Lipitor is not agreeing with him. States that he is supposed to take it at 6pm and it makes him feel jittery.  Pt is unable to describe how it makes him feel, only "not good". Would like to know if there is another chol med he can take that won't make him feel so bad.  Please advise.  Thank you.

## 2013-07-04 NOTE — Telephone Encounter (Signed)
Switch him back to Simvastatin 40 mg once daily.

## 2013-07-12 ENCOUNTER — Telehealth: Payer: Self-pay | Admitting: *Deleted

## 2013-07-12 NOTE — Telephone Encounter (Signed)
[facility] to fax labs 5/19

## 2013-07-12 NOTE — Telephone Encounter (Signed)
Patient called and is wanting his lab results, please call.

## 2013-07-14 NOTE — Telephone Encounter (Signed)
Reviewed results with patient. 

## 2013-07-14 NOTE — Telephone Encounter (Signed)
Pt requests his results be called to this different contact #

## 2013-07-14 NOTE — Telephone Encounter (Signed)
Message copied by Tracie Harrier on Thu Jul 14, 2013  5:02 PM ------      Message from: Kathlyn Sacramento A      Created: Thu Jul 14, 2013  4:33 PM       Liver profile was normal.       Cholesterol improved on Atorvastatin. However, he said that it did not agree with him so he is back on Simvastatin .                   ----- Message -----         From: Tracie Harrier, RN         Sent: 07/14/2013  10:38 AM           To: Wellington Hampshire, MD            Patients labs were sent from [facility] scanned then and for some reason they did not come to triage        They are scanned in under labs in his chart        He wanted to know if you could take a look at them                Thanks!                      ------

## 2013-10-12 ENCOUNTER — Encounter (INDEPENDENT_AMBULATORY_CARE_PROVIDER_SITE_OTHER): Payer: Medicare Other | Admitting: Ophthalmology

## 2013-10-12 DIAGNOSIS — H43819 Vitreous degeneration, unspecified eye: Secondary | ICD-10-CM

## 2013-10-12 DIAGNOSIS — H35039 Hypertensive retinopathy, unspecified eye: Secondary | ICD-10-CM

## 2013-10-12 DIAGNOSIS — H33009 Unspecified retinal detachment with retinal break, unspecified eye: Secondary | ICD-10-CM

## 2013-10-12 DIAGNOSIS — H33309 Unspecified retinal break, unspecified eye: Secondary | ICD-10-CM

## 2013-10-12 DIAGNOSIS — I10 Essential (primary) hypertension: Secondary | ICD-10-CM

## 2013-12-08 ENCOUNTER — Ambulatory Visit (INDEPENDENT_AMBULATORY_CARE_PROVIDER_SITE_OTHER): Payer: Medicare Other | Admitting: Cardiovascular Disease

## 2013-12-08 ENCOUNTER — Encounter: Payer: Self-pay | Admitting: Cardiovascular Disease

## 2013-12-08 VITALS — BP 130/83 | HR 80 | Ht 69.0 in | Wt 224.0 lb

## 2013-12-08 DIAGNOSIS — E785 Hyperlipidemia, unspecified: Secondary | ICD-10-CM

## 2013-12-08 DIAGNOSIS — I5022 Chronic systolic (congestive) heart failure: Secondary | ICD-10-CM

## 2013-12-08 DIAGNOSIS — I059 Rheumatic mitral valve disease, unspecified: Secondary | ICD-10-CM

## 2013-12-08 DIAGNOSIS — I255 Ischemic cardiomyopathy: Secondary | ICD-10-CM

## 2013-12-08 DIAGNOSIS — I2510 Atherosclerotic heart disease of native coronary artery without angina pectoris: Secondary | ICD-10-CM

## 2013-12-08 DIAGNOSIS — I10 Essential (primary) hypertension: Secondary | ICD-10-CM

## 2013-12-08 NOTE — Progress Notes (Signed)
HPI  This is a 58 year old man who is here today for followup visit. He has known history of coronary artery disease status post myocardial infarction with late presentation. He is being treated medically and did not have cardiac catheterization done due to chronic kidney disease (creatinine around 2.5) and late presentation. He had a nuclear stress test in January of 2013 which showed mostly a large transmural infarct in the left circumflex distribution without significant ischemia. EF was 40-45% with moderate mitral regurgitation initially. Most recent echocardiogram in March 2014 showed an ejection fraction of 40% with mild mitral regurgitation. He has been stable on medical therapy. He denies any chest pain. He has stable exertional dyspnea. He has gained weight since last visit.  Allergies  Allergen Reactions  . Nifedipine Diarrhea  . Other     Other reaction(s): Other (See Comments) Hydal, stiffness in joints  . Amlodipine   . Benazepril   . Haloperidol And Related   . Omeprazole      Current Outpatient Prescriptions on File Prior to Visit  Medication Sig Dispense Refill  . amLODipine (NORVASC) 10 MG tablet Take 10 mg by mouth daily.      . ARIPiprazole (ABILIFY) 20 MG tablet Take 20 mg by mouth 2 (two) times daily.      [name] aspirin EC 81 MG tablet Take 81 mg by mouth 2 (two) times daily.       [name] buPROPion (WELLBUTRIN) 100 MG tablet Take 200 mg by mouth 2 (two) times daily.       . hydrALAZINE (APRESOLINE) 100 MG tablet Take 100 mg by mouth 2 (two) times daily.      . isosorbide mononitrate (IMDUR) 60 MG 24 hr tablet take 1 tablet by mouth every morning  30 tablet  6  . LORazepam (ATIVAN) 1 MG tablet Take by mouth. Takes 1 tablet am and 2 tablets pm daily.      . metoprolol (LOPRESSOR) 100 MG tablet Take 200 mg by mouth 2 (two) times daily.       . nitroGLYCERIN (NITROSTAT) 0.4 MG SL tablet Place 1 tablet (0.4 mg total) under the tongue every 5 (five) minutes as needed for chest  pain.  25 tablet  3  . ranitidine (ZANTAC) 150 MG tablet Take 150 mg by mouth 2 (two) times daily as needed.      . sertraline (ZOLOFT) 100 MG tablet Take 150 mg by mouth daily.       . simvastatin (ZOCOR) 40 MG tablet Take 1 tablet (40 mg total) by mouth at bedtime.  90 tablet  3  . Tadalafil (CIALIS PO) Take by mouth as needed.       No current facility-administered medications on file prior to visit.     Past Medical History  Diagnosis Date  . Acute myocardial infarction, subendocardial infarction, initial episode of care   . Other primary cardiomyopathies   . Esophageal reflux   . Depressive disorder, not elsewhere classified   . Gout, unspecified   . Unspecified schizophrenia, unspecified condition   . Encounter for long-term (current) use of other medications   . Personal history of tobacco use, presenting hazards to health   . Hypotension, unspecified   . Mitral valve disorders     moderate MR  . CKD (chronic kidney disease) stage 3, GFR 30-59 ml/min   . Coronary atherosclerosis of native coronary artery   . Hypertension   . Other and unspecified hyperlipidemia   . Torn ligament  Past Surgical History  Procedure Laterality Date  . None    . Colonoscopy    . Eye surgery       History reviewed. No pertinent family history.   History   Social History  . Marital Status: Legally Separated    Spouse Name: N/A    Number of Children: N/A  . Years of Education: N/A   Occupational History  . Not on file.   Social History Main Topics  . Smoking status: Former Smoker -- 1.00 packs/day for 23 years    Types: Cigarettes    Quit date: 02/25/1996  . Smokeless tobacco: Never Used     Comment: + 15 years of smoking  . Alcohol Use: No  . Drug Use: No  . Sexual Activity: Not on file   Other Topics Concern  . Not on file   Social History Narrative  . No narrative on file     PHYSICAL EXAM   BP 130/83  Pulse 80  Ht 5\' 9"  (1.753 m)  Wt 224 lb (101.606  kg)  BMI 33.06 kg/m2  Constitutional: He is oriented to person, place, and time. He appears well-developed and well-nourished. No distress.  HENT: No nasal discharge.  Head: Normocephalic and atraumatic.  Eyes: Pupils are equal and round. Right eye exhibits no discharge. Left eye exhibits no discharge.  Neck: Normal range of motion. Neck supple. No JVD present. No thyromegaly present.  Cardiovascular: Normal rate, regular rhythm, normal heart sounds and. Exam reveals no gallop and no friction rub. No murmur heard.  Pulmonary/Chest: Effort normal and breath sounds normal. No stridor. No respiratory distress. He has no wheezes. He has no rales. He exhibits no tenderness.  Abdominal: Soft. Bowel sounds are normal. He exhibits no distension. There is no tenderness. There is no rebound and no guarding.  Musculoskeletal: Normal range of motion. He exhibits no edema and no tenderness.  Neurological: He is alert and oriented to person, place, and time. Coordination normal.  Skin: Skin is warm and dry. No rash noted. He is not diaphoretic. No erythema. No pallor.  Psychiatric: He has a normal mood and affect. His behavior is normal. Judgment and thought content normal.     EKG: Sinus  Rhythm  -  Negative T-waves  -Possible  Anterior  ischemia.   ABNORMAL     ASSESSMENT AND PLAN

## 2013-12-08 NOTE — Patient Instructions (Signed)
Your physician wants you to follow-up in: 6 months with Dr. Fletcher Anon. You will receive a reminder letter in the mail two months in advance. If you don't receive a letter, please call our office to schedule the follow-up appointment.  Your next appointment will be scheduled in our new office located at :  [place]  [address], [place]  [place], [place] [address]

## 2013-12-09 DIAGNOSIS — I5022 Chronic systolic (congestive) heart failure: Secondary | ICD-10-CM | POA: Insufficient documentation

## 2013-12-09 NOTE — Assessment & Plan Note (Signed)
He has no symptoms of angina. Continue medical therapy. 

## 2013-12-09 NOTE — Assessment & Plan Note (Signed)
Continue treatment with simvastatin. He did not tolerate atorvastatin.

## 2013-12-09 NOTE — Assessment & Plan Note (Signed)
Blood pressure is well controlled on current medications. 

## 2013-12-09 NOTE — Assessment & Plan Note (Signed)
He appears to be euvolemic. Currently New [place] Heart Association class II. Blood pressure is reasonably controlled and he is on optimal medical therapy. Most recent ejection fraction was 40%. I did discuss with him the contraindication of nitroglycerin and phosphodiesterase inhibitors including Cialis. He knows not to take these together. When he uses Cialis he uses a very small dose.

## 2014-02-08 ENCOUNTER — Other Ambulatory Visit: Payer: Self-pay

## 2014-02-08 MED ORDER — ISOSORBIDE MONONITRATE ER 60 MG PO TB24: 60.0000 mg | ORAL_TABLET | Freq: Every morning | ORAL | Status: DC

## 2014-07-19 ENCOUNTER — Other Ambulatory Visit: Payer: Self-pay | Admitting: *Deleted

## 2014-07-19 MED ORDER — ISOSORBIDE MONONITRATE ER 60 MG PO TB24: 60.0000 mg | ORAL_TABLET | Freq: Every morning | ORAL | Status: DC

## 2014-07-28 ENCOUNTER — Encounter: Payer: Self-pay | Admitting: Cardiovascular Disease

## 2014-07-28 ENCOUNTER — Ambulatory Visit (INDEPENDENT_AMBULATORY_CARE_PROVIDER_SITE_OTHER): Payer: Medicare Other | Admitting: Cardiovascular Disease

## 2014-07-28 VITALS — BP 142/84 | HR 78 | Ht 69.0 in | Wt 226.5 lb

## 2014-07-28 DIAGNOSIS — E785 Hyperlipidemia, unspecified: Secondary | ICD-10-CM

## 2014-07-28 DIAGNOSIS — I255 Ischemic cardiomyopathy: Secondary | ICD-10-CM

## 2014-07-28 DIAGNOSIS — I10 Essential (primary) hypertension: Secondary | ICD-10-CM

## 2014-07-28 DIAGNOSIS — I5022 Chronic systolic (congestive) heart failure: Secondary | ICD-10-CM

## 2014-07-28 DIAGNOSIS — I2510 Atherosclerotic heart disease of native coronary artery without angina pectoris: Secondary | ICD-10-CM | POA: Diagnosis not present

## 2014-07-28 DIAGNOSIS — I059 Rheumatic mitral valve disease, unspecified: Secondary | ICD-10-CM | POA: Diagnosis not present

## 2014-07-28 NOTE — Assessment & Plan Note (Signed)
He appears to be euvolemic. Currently New York Heart Association class II. Blood pressure is reasonably controlled and he is on optimal medical therapy. Most recent ejection fraction was 40%.

## 2014-07-28 NOTE — Assessment & Plan Note (Signed)
He has no symptoms suggestive of angina. Continue medical therapy. 

## 2014-07-28 NOTE — Assessment & Plan Note (Signed)
Blood pressure is reasonably controlled on current medications. 

## 2014-07-28 NOTE — Patient Instructions (Addendum)
Medication Instructions:  Continue same medications.   Labwork: None.   Testing/Procedures: None  Follow-Up: In 6 months with Dr. Fletcher Anon  Any Other Special Instructions Will Be Listed Below (If Applicable).

## 2014-07-28 NOTE — Progress Notes (Signed)
HPI  This is a 59 year old man who is here today for followup visit. He has known history of coronary artery disease status post myocardial infarction with late presentation. He is being treated medically and did not have cardiac catheterization done due to chronic kidney disease (creatinine around 2.5) and late presentation. He had a nuclear stress test in January of 2013 which showed mostly a large transmural infarct in the left circumflex distribution without significant ischemia. EF was 40-45% with moderate mitral regurgitation initially. Most recent echocardiogram in March 2014 showed an ejection fraction of 40% with mild mitral regurgitation. He has been stable on medical therapy. He denies any chest pain. He has stable exertional dyspnea.  I reviewed his labs from October 2015 which showed a creatinine of 2.4 with an estimated GFR of 33, normal thyroid function, cholesterol of 184, triglyceride of 161, HDL of 49 and an LDL of 103. He did not tolerate atorvastatin in the past and currently is on simvastatin.   Allergies  Allergen Reactions  . Nifedipine Diarrhea  . Other     Other reaction(s): Other (See Comments) Hydal, stiffness in joints  . Amlodipine   . Benazepril   . Haloperidol And Related   . Omeprazole      Current Outpatient Prescriptions on File Prior to Visit  Medication Sig Dispense Refill  . amLODipine (NORVASC) 10 MG tablet Take 10 mg by mouth daily.    . ARIPiprazole (ABILIFY) 20 MG tablet Take 20 mg by mouth 2 (two) times daily.    [name] aspirin EC 81 MG tablet Take 81 mg by mouth 2 (two) times daily.     [name] buPROPion (WELLBUTRIN) 100 MG tablet Take 200 mg by mouth 2 (two) times daily.     . colchicine 0.6 MG tablet Take 0.6 mg by mouth as needed.    . hydrALAZINE (APRESOLINE) 100 MG tablet Take 100 mg by mouth 2 (two) times daily.    . isosorbide mononitrate (IMDUR) 60 MG 24 hr tablet Take 1 tablet (60 mg total) by mouth every morning. 30 tablet 3  . latanoprost  (XALATAN) 0.005 % ophthalmic solution 1 drop at bedtime.    [name] LORazepam (ATIVAN) 1 MG tablet Take by mouth. Takes 1 tablet am and 2 tablets pm daily.    . metoprolol (LOPRESSOR) 100 MG tablet Take 200 mg by mouth 2 (two) times daily.     . nitroGLYCERIN (NITROSTAT) 0.4 MG SL tablet Place 1 tablet (0.4 mg total) under the tongue every 5 (five) minutes as needed for chest pain. 25 tablet 3  . QUEtiapine (SEROQUEL) 300 MG tablet Take 300 mg by mouth at bedtime.    . ranitidine (ZANTAC) 150 MG tablet Take 150 mg by mouth 2 (two) times daily as needed.    . sertraline (ZOLOFT) 100 MG tablet Take 150 mg by mouth daily.     . simvastatin (ZOCOR) 40 MG tablet Take 1 tablet (40 mg total) by mouth at bedtime. 90 tablet 3  . spironolactone (ALDACTONE) 25 MG tablet Take 50 mg by mouth daily.    . Tadalafil (CIALIS PO) Take by mouth as needed.     No current facility-administered medications on file prior to visit.     Past Medical History  Diagnosis Date  . Acute myocardial infarction, subendocardial infarction, initial episode of care   . Other primary cardiomyopathies   . Esophageal reflux   . Depressive disorder, not elsewhere classified   . Gout, unspecified   .  Unspecified schizophrenia, unspecified condition   . Encounter for long-term (current) use of other medications   . Personal history of tobacco use, presenting hazards to health   . Hypotension, unspecified   . Mitral valve disorders     moderate MR  . CKD (chronic kidney disease) stage 3, GFR 30-59 ml/min   . Coronary atherosclerosis of native coronary artery   . Hypertension   . Other and unspecified hyperlipidemia   . Torn ligament      Past Surgical History  Procedure Laterality Date  . None    . Colonoscopy    . Eye surgery       Family History  Problem Relation Age of Onset  . Family history unknown: Yes     History   Social History  . Marital Status: Legally Separated    Spouse Name: N/A  . Number of  Children: N/A  . Years of Education: N/A   Occupational History  . Not on file.   Social History Main Topics  . Smoking status: Former Smoker -- 1.00 packs/day for 23 years    Types: Cigarettes    Quit date: 02/25/1996  . Smokeless tobacco: Never Used     Comment: + 15 years of smoking  . Alcohol Use: No  . Drug Use: No  . Sexual Activity: Not on file   Other Topics Concern  . Not on file   Social History Narrative     PHYSICAL EXAM   BP 142/84 mmHg  Pulse 78  Ht 5\' 9"  (1.753 m)  Wt 226 lb 8 oz (102.74 kg)  BMI 33.43 kg/m2  Constitutional: He is oriented to person, place, and time. He appears well-developed and well-nourished. No distress.  HENT: No nasal discharge.  Head: Normocephalic and atraumatic.  Eyes: Pupils are equal and round. Right eye exhibits no discharge. Left eye exhibits no discharge.  Neck: Normal range of motion. Neck supple. No JVD present. No thyromegaly present.  Cardiovascular: Normal rate, regular rhythm, normal heart sounds and. Exam reveals no gallop and no friction rub. No murmur heard.  Pulmonary/Chest: Effort normal and breath sounds normal. No stridor. No respiratory distress. He has no wheezes. He has no rales. He exhibits no tenderness.  Abdominal: Soft. Bowel sounds are normal. He exhibits no distension. There is no tenderness. There is no rebound and no guarding.  Musculoskeletal: Normal range of motion. He exhibits no edema and no tenderness.  Neurological: He is alert and oriented to person, place, and time. Coordination normal.  Skin: Skin is warm and dry. No rash noted. He is not diaphoretic. No erythema. No pallor.  Psychiatric: He has a normal mood and affect. His behavior is normal. Judgment and thought content normal.     EKG: Sinus  Rhythm  -  Nonspecific T-abnormality.   ABNORMAL    ASSESSMENT AND PLAN

## 2014-07-28 NOTE — Assessment & Plan Note (Signed)
His LDL was not at target. He did not tolerate atorvastatin in the past. I will plan on switching him to generic rosuvastatin once available.

## 2014-08-25 ENCOUNTER — Other Ambulatory Visit: Payer: Self-pay

## 2014-08-25 MED ORDER — ISOSORBIDE MONONITRATE ER 60 MG PO TB24: 60.0000 mg | ORAL_TABLET | Freq: Every morning | ORAL | Status: DC

## 2014-08-25 NOTE — Telephone Encounter (Signed)
Refill sent for isosorbide mono 60 mg

## 2014-10-17 ENCOUNTER — Emergency Department (HOSPITAL_COMMUNITY)
Admission: EM | Admit: 2014-10-17 | Discharge: 2014-10-18 | Disposition: A | Discharge status: Home / Self Care | Payer: Medicare Other | Attending: Internal Medicine | Admitting: Internal Medicine

## 2014-10-17 ENCOUNTER — Emergency Department (HOSPITAL_BASED_OUTPATIENT_CLINIC_OR_DEPARTMENT_OTHER): Admit: 2014-10-17 | Discharge: 2014-10-17 | Disposition: A | Discharge status: Home / Self Care | Payer: Medicare Other

## 2014-10-17 ENCOUNTER — Encounter (HOSPITAL_COMMUNITY): Payer: Self-pay | Admitting: Nurse Practitioner

## 2014-10-17 ENCOUNTER — Emergency Department (HOSPITAL_COMMUNITY): Payer: Medicare Other

## 2014-10-17 ENCOUNTER — Telehealth: Payer: Self-pay | Admitting: *Deleted

## 2014-10-17 DIAGNOSIS — I2510 Atherosclerotic heart disease of native coronary artery without angina pectoris: Secondary | ICD-10-CM | POA: Insufficient documentation

## 2014-10-17 DIAGNOSIS — F329 Major depressive disorder, single episode, unspecified: Secondary | ICD-10-CM | POA: Insufficient documentation

## 2014-10-17 DIAGNOSIS — Z7982 Long term (current) use of aspirin: Secondary | ICD-10-CM | POA: Insufficient documentation

## 2014-10-17 DIAGNOSIS — K219 Gastro-esophageal reflux disease without esophagitis: Secondary | ICD-10-CM | POA: Insufficient documentation

## 2014-10-17 DIAGNOSIS — I252 Old myocardial infarction: Secondary | ICD-10-CM | POA: Insufficient documentation

## 2014-10-17 DIAGNOSIS — N289 Disorder of kidney and ureter, unspecified: Secondary | ICD-10-CM | POA: Diagnosis not present

## 2014-10-17 DIAGNOSIS — I129 Hypertensive chronic kidney disease with stage 1 through stage 4 chronic kidney disease, or unspecified chronic kidney disease: Secondary | ICD-10-CM | POA: Insufficient documentation

## 2014-10-17 DIAGNOSIS — M79609 Pain in unspecified limb: Secondary | ICD-10-CM | POA: Diagnosis not present

## 2014-10-17 DIAGNOSIS — Z87891 Personal history of nicotine dependence: Secondary | ICD-10-CM | POA: Insufficient documentation

## 2014-10-17 DIAGNOSIS — R0602 Shortness of breath: Secondary | ICD-10-CM

## 2014-10-17 DIAGNOSIS — Z79899 Other long term (current) drug therapy: Secondary | ICD-10-CM | POA: Insufficient documentation

## 2014-10-17 DIAGNOSIS — M109 Gout, unspecified: Secondary | ICD-10-CM | POA: Diagnosis not present

## 2014-10-17 DIAGNOSIS — I82411 Acute embolism and thrombosis of right femoral vein: Secondary | ICD-10-CM | POA: Diagnosis present

## 2014-10-17 DIAGNOSIS — N183 Chronic kidney disease, stage 3 (moderate): Secondary | ICD-10-CM | POA: Diagnosis not present

## 2014-10-17 DIAGNOSIS — I509 Heart failure, unspecified: Secondary | ICD-10-CM | POA: Diagnosis not present

## 2014-10-17 DIAGNOSIS — I5022 Chronic systolic (congestive) heart failure: Secondary | ICD-10-CM | POA: Diagnosis not present

## 2014-10-17 DIAGNOSIS — I82401 Acute embolism and thrombosis of unspecified deep veins of right lower extremity: Secondary | ICD-10-CM | POA: Insufficient documentation

## 2014-10-17 HISTORY — DX: Ischemic cardiomyopathy: I25.5

## 2014-10-17 LAB — BASIC METABOLIC PANEL WITH GFR
Anion gap: 7 (ref 5–15)
BUN: 24 mg/dL — ABNORMAL HIGH (ref 6–20)
CO2: 23 mmol/L (ref 22–32)
Calcium: 8.9 mg/dL (ref 8.9–10.3)
Chloride: 109 mmol/L (ref 101–111)
Creatinine, Ser: 2.78 mg/dL — ABNORMAL HIGH (ref 0.61–1.24)
GFR calc Af Amer: 27 mL/min — ABNORMAL LOW (ref >60)
GFR calc non Af Amer: 24 mL/min — ABNORMAL LOW (ref >60)
Glucose, Bld: 93 mg/dL (ref 65–99)
Potassium: 4.6 mmol/L (ref 3.5–5.1)
Sodium: 139 mmol/L (ref 135–145)

## 2014-10-17 LAB — I-STAT TROPONIN, ED
Troponin i, poc: 0.01 ng/mL (ref 0.00–0.08)
Troponin i, poc: 0 ng/mL (ref 0.00–0.08)

## 2014-10-17 LAB — HEPATIC FUNCTION PANEL
ALT: 129 U/L — ABNORMAL HIGH (ref 17–63)
AST: 53 U/L — ABNORMAL HIGH (ref 15–41)
Albumin: 3.3 g/dL — ABNORMAL LOW (ref 3.5–5.0)
Alkaline Phosphatase: 102 U/L (ref 38–126)
Bilirubin, Direct: 0.2 mg/dL (ref 0.1–0.5)
Indirect Bilirubin: 0.5 mg/dL (ref 0.3–0.9)
Total Bilirubin: 0.7 mg/dL (ref 0.3–1.2)
Total Protein: 5.6 g/dL — ABNORMAL LOW (ref 6.5–8.1)

## 2014-10-17 LAB — CBC
HCT: 43.4 % (ref 39.0–52.0)
Hemoglobin: 13.9 g/dL (ref 13.0–17.0)
MCH: 31.2 pg (ref 26.0–34.0)
MCHC: 32 g/dL (ref 30.0–36.0)
MCV: 97.3 fL (ref 78.0–100.0)
Platelets: 104 10*3/uL — ABNORMAL LOW (ref 150–400)
RBC: 4.46 MIL/uL (ref 4.22–5.81)
RDW: 15 % (ref 11.5–15.5)
WBC: 8.2 10*3/uL (ref 4.0–10.5)

## 2014-10-17 LAB — D-DIMER, QUANTITATIVE: D-Dimer, Quant: 3.48 ug{FEU}/mL — ABNORMAL HIGH (ref 0.00–0.48)

## 2014-10-17 LAB — CBG MONITORING, ED: Glucose-Capillary: 82 mg/dL (ref 65–99)

## 2014-10-17 LAB — URINALYSIS, ROUTINE W REFLEX MICROSCOPIC
Bilirubin Urine: NEGATIVE
Glucose, UA: NEGATIVE mg/dL
Hgb urine dipstick: NEGATIVE
Ketones, ur: NEGATIVE mg/dL
Leukocytes, UA: NEGATIVE
Nitrite: NEGATIVE
Protein, ur: 30 mg/dL — AB
Specific Gravity, Urine: 1.017 (ref 1.005–1.030)
Urobilinogen, UA: 0.2 mg/dL (ref 0.0–1.0)
pH: 5.5 (ref 5.0–8.0)

## 2014-10-17 LAB — URINE MICROSCOPIC-ADD ON

## 2014-10-17 LAB — BRAIN NATRIURETIC PEPTIDE: B Natriuretic Peptide: 1052.4 pg/mL — ABNORMAL HIGH (ref 0.0–100.0)

## 2014-10-17 MED ORDER — RIVAROXABAN (XARELTO) EDUCATION KIT FOR DVT/PE PATIENTS
PACK | Freq: Once | Status: AC
  Administered 2014-10-18
  Filled 2014-10-17: qty 1

## 2014-10-17 MED ORDER — TECHNETIUM TO 99M ALBUMIN AGGREGATED
6.3000 | Freq: Once | INTRAVENOUS | Status: AC | PRN
  Administered 2014-10-17: 6 via INTRAVENOUS

## 2014-10-17 MED ORDER — RIVAROXABAN 15 MG PO TABS
15.0000 mg | ORAL_TABLET | Freq: Two times a day (BID) | ORAL | Status: DC
  Administered 2014-10-17: 15 mg via ORAL
  Filled 2014-10-17 (×2): qty 1

## 2014-10-17 MED ORDER — TECHNETIUM TC 99M DIETHYLENETRIAME-PENTAACETIC ACID: 41.6000 | Freq: Once | INTRAVENOUS | Status: DC | PRN

## 2014-10-17 MED ORDER — RIVAROXABAN 20 MG PO TABS: 20.0000 mg | ORAL_TABLET | Freq: Every day | ORAL | Status: DC

## 2014-10-17 NOTE — ED Notes (Signed)
Patient ambulated well down hallway at 98% RA.

## 2014-10-17 NOTE — ED Provider Notes (Signed)
CSN: [id number]     Arrival date & time 10/17/14  1508 History   First MD Initiated Contact with Patient 10/17/14 1838     Chief Complaint  Patient presents with  . Weakness  . Shortness of Breath      HPI Pt was seen at [facility]. Per pt, c/o gradual onset and worsening of persistent multiple symptoms for the past 4 days. Symptoms include: SOB (esp on exertion), generalized weakness/fatigue, bilat calves pain and peripheral edema. Pt states he has been evaluated by his PMD x2 for his symptoms, rx prednisone with "some" improvement. Pt states he is "being set up for a stress test and breathing test." Denies palpitations, no cough, no back pain, no abd pain, no N/V/D, no fevers, no rash.    Past Medical History  Diagnosis Date  . Acute myocardial infarction, subendocardial infarction, initial episode of care   . Other primary cardiomyopathies   . Esophageal reflux   . Depressive disorder, not elsewhere classified   . Gout, unspecified   . Unspecified schizophrenia, unspecified condition   . Encounter for long-term (current) use of other medications   . Personal history of tobacco use, presenting hazards to health   . Hypotension, unspecified   . Mitral valve disorders     moderate MR  . Coronary atherosclerosis of native coronary artery   . Hypertension   . Other and unspecified hyperlipidemia   . Torn ligament   . Ischemic cardiomyopathy   . Hx of echocardiogram 2014    EF 40%  . CKD (chronic kidney disease) stage 3, GFR 30-59 ml/min     baseline Cr around 2.5  . 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 CHF (congestive heart failure)    Past Surgical History  Procedure Laterality Date  . None    . Colonoscopy    . Eye surgery     Family History  Problem Relation Age of Onset  . Family history unknown: Yes   Social History  Substance Use Topics  . Smoking status: Former Smoker --  1.00 packs/day for 23 years    Types: Cigarettes    Quit date: 02/25/1996  . Smokeless tobacco: Never Used     Comment: + 15 years of smoking  . Alcohol Use: No    Review of Systems ROS: Statement: All systems negative except as marked or noted in the HPI; Constitutional: Negative for fever and chills. +fatigue/generalized weakness.; ; Eyes: Negative for eye pain, redness and discharge. ; ; ENMT: Negative for ear pain, hoarseness, nasal congestion, sinus pressure and sore throat. ; ; Cardiovascular: Negative for chest pain, palpitations, diaphoresis, and +peripheral edema. ; ; Respiratory: +SOB. Negative for cough, wheezing and stridor. ; ; Gastrointestinal: Negative for nausea, vomiting, diarrhea, abdominal pain, blood in stool, hematemesis, jaundice and rectal bleeding. . ; ; Genitourinary: Negative for dysuria, flank pain and hematuria. ; ; Musculoskeletal: +bilat calf pain. Negative for back pain and neck pain. Negative for swelling and trauma.; ; Skin: Negative for pruritus, rash, abrasions, blisters, bruising and skin lesion.; ; Neuro: Negative for headache, lightheadedness and neck stiffness. Negative for altered level of consciousness , altered mental status, extremity weakness, paresthesias, involuntary movement, seizure and syncope.      Allergies  Nifedipine; Other; Benazepril; Haloperidol and related; and Omeprazole  Home Medications   Prior to Admission medications   Medication Sig Start Date End Date Taking? Authorizing Provider  amLODipine (NORVASC) 10 MG tablet Take 10 mg by mouth daily.    Historical Provider, MD  ARIPiprazole (ABILIFY) 20 MG tablet Take 20 mg by mouth 2 (two) times daily.    Historical Provider, MD  aspirin EC 81 MG tablet Take 81 mg by mouth 2 (two) times daily.     Historical Provider, MD  buPROPion (WELLBUTRIN) 100 MG tablet Take 200 mg by mouth 2 (two) times daily.     Historical Provider, MD  colchicine 0.6 MG tablet Take 0.6 mg by mouth as needed.     Historical Provider, MD  hydrALAZINE (APRESOLINE) 100 MG tablet Take 100 mg by mouth 2 (two) times daily.    Historical Provider, MD  HYDROcodone-acetaminophen (NORCO/VICODIN) 5-325 MG per tablet as needed. 07/21/14   Historical Provider, MD  isosorbide mononitrate (IMDUR) 60 MG 24 hr tablet Take 1 tablet (60 mg total) by mouth every morning. 08/25/14   Wellington Hampshire, MD  latanoprost (XALATAN) 0.005 % ophthalmic solution 1 drop at bedtime.    Historical Provider, MD  LORazepam (ATIVAN) 1 MG tablet Take by mouth. Takes 1 tablet am and 2 tablets pm daily.    Historical Provider, MD  metoprolol (LOPRESSOR) 100 MG tablet Take 200 mg by mouth 2 (two) times daily.     Historical Provider, MD  nitroGLYCERIN (NITROSTAT) 0.4 MG SL tablet Place 1 tablet (0.4 mg total) under the tongue every 5 (five) minutes as needed for chest pain. 03/21/11 07/28/14  Wellington Hampshire, MD  QUEtiapine (SEROQUEL) 300 MG tablet Take 300 mg by mouth at bedtime.    Historical Provider, MD  ranitidine (ZANTAC) 150 MG tablet Take 150 mg by mouth 2 (two) times daily as needed.    Historical Provider, MD  sertraline (ZOLOFT) 100 MG tablet Take 150 mg by mouth daily.     Historical Provider, MD  simvastatin (ZOCOR) 40 MG tablet Take 1 tablet (40 mg total) by mouth at bedtime. 07/04/13   Wellington Hampshire, MD  spironolactone (ALDACTONE) 25 MG tablet Take 50 mg by mouth daily.    Historical Provider, MD  Tadalafil (CIALIS PO) Take by mouth as needed.    Historical Provider, MD   BP 116/87 mmHg  Pulse 80  Temp(Src) 97.6 F (36.4 C) (Oral)  Resp 16  Ht 5\' 8"  (1.727 m)  Wt 223 lb (101.152 kg)  BMI 33.91 kg/m2  SpO2 99%   Orthostatic Vital Signs Orthostatic Lying  - BP- Lying: 141/84 mmHg ; Pulse- Lying: 81  Orthostatic Sitting - BP- Sitting: 125/91 mmHg ; Pulse- Sitting: 86  Orthostatic Standing at 0 minutes - BP- Standing at 0 minutes: 148/88 mmHg ; Pulse- Standing at 0 minutes: 85      Physical Exam  1845: Physical examination:   Nursing notes reviewed; Vital signs and O2 SAT reviewed;  Constitutional: Well developed, Well nourished, Well hydrated, In no acute distress; Head:  Normocephalic, atraumatic; Eyes: EOMI, PERRL, No scleral icterus; ENMT: Mouth and pharynx normal, Mucous membranes moist; Neck: Supple, Full range of motion, No lymphadenopathy; Cardiovascular: Regular rate and rhythm, No gallop; Respiratory: Breath sounds clear & equal bilaterally, No wheezes.  Speaking full sentences with ease, Normal respiratory effort/excursion; Chest: Nontender, Movement normal; Abdomen: Soft, Nontender, Nondistended, Normal bowel sounds; Genitourinary: No CVA tenderness; Extremities: Pulses normal, No deformity. +mild TTP right>left calf. No edema, No calf edema or asymmetry.; Neuro: AA&Ox3, Major CN grossly intact.  Speech clear. No gross focal motor or sensory deficits in extremities.; Skin: Color normal, Warm,  Dry.   ED Course  Procedures (including critical care time)  Labs Review   Imaging Review  I have personally reviewed and evaluated these images and lab results as part of my medical decision-making.   EKG Interpretation   Date/Time:  Tuesday October 17 2014 15:13:18 EDT Ventricular Rate:  82 PR Interval:  132 QRS Duration: 92 QT Interval:  406 QTC Calculation: 474 R Axis:   19 Text Interpretation:  Normal sinus rhythm Normal ECG When compared with  ECG of 03/13/2011 Nonspecific T wave abnormality is no longer Present  Confirmed by [facility]  MD, Nunzio Cory [phone]) on 10/17/2014 7:13:44 PM      MDM  MDM Reviewed: previous chart, nursing note and vitals Reviewed previous: labs and ECG Interpretation: labs, ECG, x-ray and ultrasound      *Preliminary Results* Bilateral lower extremity venous duplex completed. The right lower extremity is positive for deep vein thrombosis involving the right mid and distal femoral vein, right popliteal vein, right posterior tibial veins, and right peroneal veins. There is  no evidence of left lower extremity deep vein thrombosis or bilateral Baker's cyst.  Preliminary results discussed with Dr. Thurnell Garbe.  10/17/2014  Maudry Mayhew, RVT, RDCS, RDMS     Results for orders placed or performed during the hospital encounter of [id number]  Basic metabolic panel  Result Value Ref Range   Sodium 139 135 - 145 mmol/L   Potassium 4.6 3.5 - 5.1 mmol/L   Chloride 109 101 - 111 mmol/L   CO2 23 22 - 32 mmol/L   Glucose, Bld 93 65 - 99 mg/dL   BUN 24 (H) 6 - 20 mg/dL   Creatinine, Ser 2.78 (H) 0.61 - 1.24 mg/dL   Calcium 8.9 8.9 - 10.3 mg/dL   GFR calc non Af Amer 24 (L) >60 mL/min   GFR calc Af Amer 27 (L) >60 mL/min   Anion gap 7 5 - 15  CBC  Result Value Ref Range   WBC 8.2 4.0 - 10.5 K/uL   RBC 4.46 4.22 - 5.81 MIL/uL   Hemoglobin 13.9 13.0 - 17.0 g/dL   HCT 43.4 39.0 - 52.0 %   MCV 97.3 78.0 - 100.0 fL   MCH 31.2 26.0 - 34.0 pg   MCHC 32.0 30.0 - 36.0 g/dL   RDW 15.0 11.5 - 15.5 %   Platelets 104 (L) 150 - 400 K/uL  Brain natriuretic peptide  Result Value Ref Range   B Natriuretic Peptide 1052.4 (H) 0.0 - 100.0 pg/mL  D-dimer, quantitative  Result Value Ref Range   D-Dimer, Quant 3.48 (H) 0.00 - 0.48 ug/mL-FEU  Hepatic function panel  Result Value Ref Range   Total Protein 5.6 (L) 6.5 - 8.1 g/dL   Albumin 3.3 (L) 3.5 - 5.0 g/dL   AST 53 (H) 15 - 41 U/L   ALT 129 (H) 17 - 63 U/L   Alkaline Phosphatase 102 38 - 126 U/L   Total Bilirubin 0.7 0.3 - 1.2 mg/dL   Bilirubin, Direct 0.2 0.1 - 0.5 mg/dL   Indirect Bilirubin 0.5 0.3 - 0.9 mg/dL  CBG monitoring, ED  Result Value Ref Range   Glucose-Capillary 82 65 - 99 mg/dL  I-stat troponin, ED  Result Value Ref Range   Troponin i, poc 0.01 0.00 - 0.08 ng/mL   Comment 3          I-stat troponin, ED  Result Value Ref Range   Troponin i, poc 0.00 0.00 - 0.08 ng/mL   Comment 3  Dg Chest 2 View 10/17/2014   CLINICAL DATA:  Shortness of breath.  Weakness  EXAM: CHEST  2 VIEW  COMPARISON:   None.  FINDINGS: The heart size is enlarged. Both lungs are clear. The visualized skeletal structures are unremarkable.  IMPRESSION: No active cardiopulmonary disease.   Electronically Signed   By: Kerby Moors M.D.   On: 10/17/2014 19:30   Nm Pulmonary Perf And Vent 10/17/2014   CLINICAL DATA:  Shortness of breath since 09/1914. DVT right lower extremity. Positive D-dimer.  EXAM: NUCLEAR MEDICINE VENTILATION - PERFUSION LUNG SCAN  TECHNIQUE: Ventilation images were obtained in multiple projections using inhaled aerosol Tc-88m DTPA. Perfusion images were obtained in multiple projections after intravenous injection of Tc-13m MAA.  RADIOPHARMACEUTICALS:  41.6 mCi Technetium-72m DTPA aerosol inhalation and 6.3 mCi Technetium-69m MAA IV  COMPARISON:  Chest 10/17/2014  FINDINGS: Ventilation: Focal areas of decreased ventilation demonstrated in the left upper lung and right lower lung.  Perfusion: Focal matched areas of decreased profusion demonstrated in the left upper lung and right lower lung. No wedge shaped peripheral perfusion defects to suggest acute pulmonary embolism.  IMPRESSION: Low probability of pulmonary embolus.   Electronically Signed   By: Lucienne Capers M.D.   On: 10/17/2014 22:18     2030:  Pt ambulated with O2 Sat 98% R/A. Denied complaints.   2235:  Will consult pharmacy regarding xarelto (pt does not want lovenox/coumadin tx).  Dx and testing d/w pt and family.  Questions answered.  Verb understanding, agreeable to admit.  T/C to [facility] Dr. Alcario Drought, case discussed, including:  HPI, pertinent PM/SHx, VS/PE, dx testing, ED course and treatment:  Agreeable to come to ED for evaluation.    Francine Graven, DO 10/20/14 1345

## 2014-10-17 NOTE — ED Notes (Addendum)
Pt c/o sob, generalized weakness, headaches, not feeling well since Friday after doing yard work.. Symptoms are worse on exertion. His PCP started him on prednisone Friday which he completed but symptoms persist. Grips = bilaterally, speech is clear, no arm drift or facial droop. Denies cough, fevers. Has noticed some wheezing intermittently. A&Ox4, speaking in full unlabored sentences.

## 2014-10-17 NOTE — Telephone Encounter (Signed)
Pt calling stating he went to PCP and they told him to call us For pt states when he walks he gets headaches, almost feels like he will fall out calf's hurt, breathing is hard. It is hard to catch his breath. bearly walk from car to house (thats about 30 feet)  PCP told pt he'd give him a "breath test" ending up with him doing more tests.  But when I asked him why he was calling us he stated his is real tired all the time.  He was hoping there was something we could do to help.  Please advise.

## 2014-10-17 NOTE — Progress Notes (Signed)
ANTICOAGULATION CONSULT NOTE - Initial Consult  Pharmacy Consult for Xarelto Indication: DVT  Allergies  Allergen Reactions  . Nifedipine Diarrhea  . Other     Other reaction(s): Other (See Comments) Hydal, stiffness in joints  . Benazepril   . Haloperidol And Related   . Omeprazole     Patient Measurements: Height: 5\' 8"  (172.7 cm) Weight: 223 lb (101.152 kg) IBW/kg (Calculated) : 68.4 Heparin Dosing Weight:   Vital Signs: Temp: 97.6 F (36.4 C) (08/23 1530) Temp Source: Oral (08/23 1530) BP: 133/83 mmHg (08/23 2221) Pulse Rate: 81 (08/23 2221)  Labs:  Recent Labs  10/17/14 1550  HGB 13.9  HCT 43.4  PLT 104*  CREATININE 2.78*    Estimated Creatinine Clearance: 33.4 mL/min (by C-G formula based on Cr of 2.78).   Medical History: Past Medical History  Diagnosis Date  . Acute myocardial infarction, subendocardial infarction, initial episode of care   . Other primary cardiomyopathies   . Esophageal reflux   . Depressive disorder, not elsewhere classified   . Gout, unspecified   . Unspecified schizophrenia, unspecified condition   . Encounter for long-term (current) use of other medications   . Personal history of tobacco use, presenting hazards to health   . Hypotension, unspecified   . Mitral valve disorders     moderate MR  . Coronary atherosclerosis of native coronary artery   . Hypertension   . Other and unspecified hyperlipidemia   . Torn ligament   . Ischemic cardiomyopathy   . Hx of echocardiogram 2014    EF 40%  . CKD (chronic kidney disease) stage 3, GFR 30-59 ml/min     baseline Cr around 2.5  . 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 CHF (congestive heart failure)     Medications:   (Not in a hospital admission) Scheduled:   Infusions:    Assessment: 59yo male with extensive cardiac history presents with weakness and SOB. Doppler is positive  for DVT in R mid and distal femoral vein, R popliteal vein, R posterior tibial veins and R peroneal veins. Pharmacy is consulted to dose xarelto for DVT. Hgb 13.9, Plt 104, D-dimer 3.48, BNP 1052.4, sCr 2.8.  Goal of Therapy:  Monitor platelets by anticoagulation protocol: Yes   Plan:  Xarelto 15mg  PO BID for 42 doses followed by 20mg  daily Monitor s/sx of bleeding Educate pt on Xarelto  Almarosa Bohac M. Diona Foley, PharmD Clinical Pharmacist Pager [phone] 10/17/2014,10:44 PM

## 2014-10-17 NOTE — Progress Notes (Signed)
*  Preliminary Results* Bilateral lower extremity venous duplex completed. The right lower extremity is positive for deep vein thrombosis involving the right mid and distal femoral vein, right popliteal vein, right posterior tibial veins, and right peroneal veins. There is no evidence of left lower extremity deep vein thrombosis or bilateral Baker's cyst.  Preliminary results discussed with Dr. Thurnell Garbe.  10/17/2014  Maudry Mayhew, RVT, RDCS, RDMS

## 2014-10-18 ENCOUNTER — Telehealth: Payer: Self-pay | Admitting: *Deleted

## 2014-10-18 DIAGNOSIS — I82411 Acute embolism and thrombosis of right femoral vein: Secondary | ICD-10-CM

## 2014-10-18 DIAGNOSIS — I5022 Chronic systolic (congestive) heart failure: Secondary | ICD-10-CM | POA: Diagnosis not present

## 2014-10-18 NOTE — Telephone Encounter (Signed)
Pt called stating he did not receive Rx for Xarelto. Pt asked for pharmacy number because pharmacy said they would give him Rx.

## 2014-10-18 NOTE — Discharge Instructions (Signed)
You were seen today for shortness of breath and leg swelling. You do have a DVT. Your shortness of breath is likely a combination of chronic heart failure. No evidence of pulmonary embolism on your scans. You need to follow-up with her primary doctor and cardiologist for an echocardiogram. Will be given a one-month supply of Xarelto.  Deep Vein Thrombosis A deep vein thrombosis (DVT) is a blood clot that develops in the deep, larger veins of the leg, arm, or pelvis. These are more dangerous than clots that might form in veins near the surface of the body. A DVT can lead to serious and even life-threatening complications if the clot breaks off and travels in the bloodstream to the lungs.  A DVT can damage the valves in your leg veins so that instead of flowing upward, the blood pools in the lower leg. This is called post-thrombotic syndrome, and it can result in pain, swelling, discoloration, and sores on the leg. CAUSES Usually, several things contribute to the formation of blood clots. Contributing factors include:  The flow of blood slows down.  The inside of the vein is damaged in some way.  You have a condition that makes blood clot more easily. RISK FACTORS Some people are more likely than others to develop blood clots. Risk factors include:   Smoking.  Being overweight (obese).  Sitting or lying still for a long time. This includes long-distance travel, paralysis, or recovery from an illness or surgery. Other factors that increase risk are:   Older age, especially over 51 years of age.  Having a family history of blood clots or if you have already had a blot clot.  Having major or lengthy surgery. This is especially true for surgery on the hip, knee, or belly (abdomen). Hip surgery is particularly high risk.  Having a long, thin tube (catheter) placed inside a vein during a medical procedure.  Breaking a hip or leg.  Having cancer or cancer treatment.  Pregnancy and  childbirth.  Hormone changes make the blood clot more easily during pregnancy.  The fetus puts pressure on the veins of the pelvis.  There is a risk of injury to veins during delivery or a caesarean delivery. The risk is highest just after childbirth.  Medicines containing the male hormone estrogen. This includes birth control pills and hormone replacement therapy.  Other circulation or heart problems.  SIGNS AND SYMPTOMS When a clot forms, it can either partially or totally block the blood flow in that vein. Symptoms of a DVT can include:  Swelling of the leg or arm, especially if one side is much worse.  Warmth and redness of the leg or arm, especially if one side is much worse.  Pain in an arm or leg. If the clot is in the leg, symptoms may be more noticeable or worse when standing or walking. The symptoms of a DVT that has traveled to the lungs (pulmonary embolism, PE) usually start suddenly and include:  Shortness of breath.  Coughing.  Coughing up blood or blood-tinged mucus.  Chest pain. The chest pain is often worse with deep breaths.  Rapid heartbeat. Anyone with these symptoms should get emergency medical treatment right away. Do not wait to see if the symptoms will go away. Call your local emergency services (911 in the [place] if you have these symptoms. Do not drive yourself to the hospital. DIAGNOSIS If a DVT is suspected, your health care provider will take a full medical history and perform a physical  exam. Tests that also may be required include:  Blood tests, including studies of the clotting properties of the blood.  Ultrasound to see if you have clots in your legs or lungs.  X-rays to show the flow of blood when dye is injected into the veins (venogram).  Studies of your lungs if you have any chest symptoms. PREVENTION  Exercise the legs regularly. Take a brisk 30-minute walk every day.  Maintain a weight that is appropriate for your height.  Avoid  sitting or lying in bed for long periods of time without moving your legs.  Women, particularly those over the age of 109 years, should consider the risks and benefits of taking estrogen medicines, including birth control pills.  Do not smoke, especially if you take estrogen medicines.  Long-distance travel can increase your risk of DVT. You should exercise your legs by walking or pumping the muscles every hour.  Many of the risk factors above relate to situations that exist with hospitalization, either for illness, injury, or elective surgery. Prevention may include medical and nonmedical measures.  Your health care provider will assess you for the need for venous thromboembolism prevention when you are admitted to the hospital. If you are having surgery, your surgeon will assess you the day of or day after surgery. TREATMENT Once identified, a DVT can be treated. It can also be prevented in some circumstances. Once you have had a DVT, you may be at increased risk for a DVT in the future. The most common treatment for DVT is blood-thinning (anticoagulant) medicine, which reduces the blood's tendency to clot. Anticoagulants can stop new blood clots from forming and stop old clots from growing. They cannot dissolve existing clots. Your body does this by itself over time. Anticoagulants can be given by mouth, through an IV tube, or by injection. Your health care provider will determine the best program for you. Other medicines or treatments that may be used are:  Heparin or related medicines (low molecular weight heparin) are often the first treatment for a blood clot. They act quickly. However, they cannot be taken orally and must be given either in shot form or by IV tube.  Heparin can cause a fall in a component of blood that stops bleeding and forms blood clots (platelets). You will be monitored with blood tests to be sure this does not occur.  Warfarin is an anticoagulant that can be swallowed.  It takes a few days to start working, so usually heparin or related medicines are used in combination. Once warfarin is working, heparin is usually stopped.  Factor Xa inhibitor medicines, such as rivaroxaban and apixaban, also reduce blood clotting. These medicines are taken orally and can often be used without heparin or related medicines.  Less commonly, clot dissolving drugs (thrombolytics) are used to dissolve a DVT. They carry a high risk of bleeding, so they are used mainly in severe cases where your life or a part of your body is threatened.  Very rarely, a blood clot in the leg needs to be removed surgically.  If you are unable to take anticoagulants, your health care provider may arrange for you to have a filter placed in a main vein in your abdomen. This filter prevents clots from traveling to your lungs. HOME CARE INSTRUCTIONS  Take all medicines as directed by your health care provider.  Learn as much as you can about DVT.  Wear a medical alert bracelet or carry a medical alert card.  Ask your health  care provider how soon you can go back to normal activities. It is important to stay active to prevent blood clots. If you are on anticoagulant medicine, avoid contact sports.  It is very important to exercise. This is especially important while traveling, sitting, or standing for long periods of time. Exercise your legs by walking or by tightening and relaxing your leg muscles regularly. Take frequent walks.  You may need to wear compression stockings. These are tight elastic stockings that apply pressure to the lower legs. This pressure can help keep the blood in the legs from clotting. Taking Warfarin Warfarin is a daily medicine that is taken by mouth. Your health care provider will advise you on the length of treatment (usually 3-6 months, sometimes lifelong). If you take warfarin:  Understand how to take warfarin and foods that can affect how warfarin works in Insurance claims handler.  Too much and too little warfarin are both dangerous. Too much warfarin increases the risk of bleeding. Too little warfarin continues to allow the risk for blood clots. Warfarin and Regular Blood Testing While taking warfarin, you will need to have regular blood tests to measure your blood clotting time. These blood tests usually include both the prothrombin time (PT) and international normalized ratio (INR) tests. The PT and INR results allow your health care provider to adjust your dose of warfarin. It is very important that you have your PT and INR tested as often as directed by your health care provider.  Warfarin and Your Diet Avoid major changes in your diet, or notify your health care provider before changing your diet. Arrange a visit with a registered dietitian to answer your questions. Many foods, especially foods high in vitamin K, can interfere with warfarin and affect the PT and INR results. You should eat a consistent amount of foods high in vitamin K. Foods high in vitamin K include:   Spinach, kale, broccoli, cabbage, collard and turnip greens, Brussels sprouts, peas, cauliflower, seaweed, and parsley.  Beef and pork liver.  Green tea.  Soybean oil. Warfarin with Other Medicines Many medicines can interfere with warfarin and affect the PT and INR results. You must:  Tell your health care provider about any and all medicines, vitamins, and supplements you take, including aspirin and other over-the-counter anti-inflammatory medicines. Be especially cautious with aspirin and anti-inflammatory medicines. Ask your health care provider before taking these.  Do not take or discontinue any prescribed or over-the-counter medicine except on the advice of your health care provider or pharmacist. Warfarin Side Effects Warfarin can have side effects, such as easy bruising and difficulty stopping bleeding. Ask your health care provider or pharmacist about other side effects of  warfarin. You will need to:  Hold pressure over cuts for longer than usual.  Notify your dentist and other health care providers that you are taking warfarin before you undergo any procedures where bleeding may occur. Warfarin with Alcohol and Tobacco   Drinking alcohol frequently can increase the effect of warfarin, leading to excess bleeding. It is best to avoid alcoholic drinks or to consume only very small amounts while taking warfarin. Notify your health care provider if you change your alcohol intake.   Do not use any tobacco products including cigarettes, chewing tobacco, or electronic cigarettes. If you smoke, quit. Ask your health care provider for help with quitting smoking. Alternative Medicines to Warfarin: Factor Xa Inhibitor Medicines  These blood-thinning medicines are taken by mouth, usually for several weeks or longer. It is important  to take the medicine every single day at the same time each day.  There are no regular blood tests required when using these medicines.  There are fewer food and drug interactions than with warfarin.  The side effects of this class of medicine are similar to those of warfarin, including excessive bruising or bleeding. Ask your health care provider or pharmacist about other potential side effects. SEEK MEDICAL CARE IF:  You notice a rapid heartbeat.  You feel weaker or more tired than usual.  You feel faint.  You notice increased bruising.  You feel your symptoms are not getting better in the time expected.  You believe you are having side effects of medicine. SEEK IMMEDIATE MEDICAL CARE IF:  You have chest pain.  You have trouble breathing.  You have new or increased swelling or pain in one leg.  You cough up blood.  You notice blood in vomit, in a bowel movement, or in urine. MAKE SURE YOU:  Understand these instructions.  Will watch your condition.  Will get help right away if you are not doing well or get  worse. Document Released: 02/10/2005 Document Revised: 06/27/2013 Document Reviewed: 10/18/2012 [facility] Patient Information 2015 [place], [place]. This information is not intended to replace advice given to you by your health care provider. Make sure you discuss any questions you have with your health care provider.

## 2014-10-18 NOTE — Consult Note (Addendum)
[facility] Medical Consultation  Glenn Hawkins [id number] DOB: 06-Jan-1956 DOA: 10/17/2014 PCP: Glenda Chroman., MD   Requesting physician: Dr. Thurnell Garbe Date of consultation: 10/17/14 Reason for consultation: SOB, DVT  Impression/Recommendations Principal Problem:   Acute deep vein thrombosis (DVT) of femoral vein of right lower extremity Active Problems:   Chronic systolic heart failure    1. Acute DVT of RLE - 1. Patients GFR is 41 per pharmacy, he therefore is a candidate for Xeralto 2. Will do Xeralto per pharm consult 3. Patient being discharged with starting month pack 4. Needs follow up with either PCP and/or cards in near future. 2. Shortness of breath -  1. Suspect patient may have PE with very small clot burden that is not showing up on VQ scan that is causing his SOB 2. Doubt that he has significant new R heart strain or central large PE given the low risk VQ scan 3. Patient is being treated for DVT with Xeralto in any case 4. Feel that patient is low risk to go home at this point (discussed extensively with patient and wife) 1. Ambulatory O2 sat is 98% on room air 2. No tachycardia 5. Likely does need cardiology follow up for outpatient 2d echo as his last one was 2 years ago anyhow. 3. Chronic CHF - 1. Does not appear to be in exacerbation at this point in time 2. CXR is negative for any pulmonary edema or pulmonary vascular congestion 3. Does not appear to be fluid overloaded at this time 4. Follow up with cards routinely   Chief Complaint: SOB  HPI:  59 yo F with SOB and generalized weakness since Friday after doing yard work.  Has had RLE pain for past 2 weeks.  SOB onset Friday.  No chest pain.  No cough.  No N/V/D.  Has R calf pain.  PCP started him on prednisone on Friday which he completed but symptoms persist.  Patient presents to the ED.  Review of Systems:  12 systems reviewed and otherwise negative  Past Medical History  Diagnosis Date   . Acute myocardial infarction, subendocardial infarction, initial episode of care   . Other primary cardiomyopathies   . Esophageal reflux   . Depressive disorder, not elsewhere classified   . Gout, unspecified   . Unspecified schizophrenia, unspecified condition   . Encounter for long-term (current) use of other medications   . Personal history of tobacco use, presenting hazards to health   . Hypotension, unspecified   . Mitral valve disorders     moderate MR  . Coronary atherosclerosis of native coronary artery   . Hypertension   . Other and unspecified hyperlipidemia   . Torn ligament   . Ischemic cardiomyopathy   . Hx of echocardiogram 2014    EF 40%  . CKD (chronic kidney disease) stage 3, GFR 30-59 ml/min     baseline Cr around 2.5  . 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 CHF (congestive heart failure)    Past Surgical History  Procedure Laterality Date  . None    . Colonoscopy    . Eye surgery     Social History:  reports that he quit smoking about 18 years ago. His smoking use included Cigarettes. He has a 23 pack-year smoking history. He has never used smokeless tobacco. He reports that he does not drink alcohol or use illicit drugs.  Allergies  Allergen  Reactions  . Nifedipine Diarrhea  . Other     Other reaction(s): Other (See Comments) Hydal, stiffness in joints  . Benazepril   . Haloperidol And Related   . Omeprazole    Family History  Problem Relation Age of Onset  . Family history unknown: Yes    Prior to Admission medications   Medication Sig Start Date End Date Taking? Authorizing Provider  amLODipine (NORVASC) 10 MG tablet Take 10 mg by mouth daily.    Historical Provider, MD  ARIPiprazole (ABILIFY) 20 MG tablet Take 20 mg by mouth 2 (two) times daily.    Historical Provider, MD  aspirin EC 81 MG tablet Take 81 mg by mouth 2 (two) times daily.      Historical Provider, MD  buPROPion (WELLBUTRIN) 100 MG tablet Take 200 mg by mouth 2 (two) times daily.     Historical Provider, MD  colchicine 0.6 MG tablet Take 0.6 mg by mouth as needed.    Historical Provider, MD  hydrALAZINE (APRESOLINE) 100 MG tablet Take 100 mg by mouth 2 (two) times daily.    Historical Provider, MD  HYDROcodone-acetaminophen (NORCO/VICODIN) 5-325 MG per tablet as needed. 07/21/14   Historical Provider, MD  isosorbide mononitrate (IMDUR) 60 MG 24 hr tablet Take 1 tablet (60 mg total) by mouth every morning. 08/25/14   Wellington Hampshire, MD  latanoprost (XALATAN) 0.005 % ophthalmic solution 1 drop at bedtime.    Historical Provider, MD  LORazepam (ATIVAN) 1 MG tablet Take by mouth. Takes 1 tablet am and 2 tablets pm daily.    Historical Provider, MD  metoprolol (LOPRESSOR) 100 MG tablet Take 200 mg by mouth 2 (two) times daily.     Historical Provider, MD  nitroGLYCERIN (NITROSTAT) 0.4 MG SL tablet Place 1 tablet (0.4 mg total) under the tongue every 5 (five) minutes as needed for chest pain. 03/21/11 07/28/14  Wellington Hampshire, MD  QUEtiapine (SEROQUEL) 300 MG tablet Take 300 mg by mouth at bedtime.    Historical Provider, MD  ranitidine (ZANTAC) 150 MG tablet Take 150 mg by mouth 2 (two) times daily as needed.    Historical Provider, MD  sertraline (ZOLOFT) 100 MG tablet Take 150 mg by mouth daily.     Historical Provider, MD  simvastatin (ZOCOR) 40 MG tablet Take 1 tablet (40 mg total) by mouth at bedtime. 07/04/13   Wellington Hampshire, MD  spironolactone (ALDACTONE) 25 MG tablet Take 50 mg by mouth daily.    Historical Provider, MD  Tadalafil (CIALIS PO) Take by mouth as needed.    Historical Provider, MD   Physical Exam: Blood pressure 123/88, pulse 85, temperature 97.6 F (36.4 C), temperature source Oral, resp. rate 17, height 5\' 8"  (1.727 m), weight 101.152 kg (223 lb), SpO2 95 %. Filed Vitals:   10/18/14 0000  BP: 123/88  Pulse: 85  Temp:   Resp: 17    General:   NAD, resting comfortably in bed Eyes: PEERLA EOMI ENT: mucous membranes moist Neck: supple w/o JVD Cardiovascular: RRR w/o MRG Respiratory: CTA B Abdomen: soft, nt, nd, bs+ Skin: no rash nor lesion Musculoskeletal: MAE, full ROM all 4 extremities Psychiatric: normal tone and affect Neurologic: AAOx3, grossly non-focal  Labs on Admission:  Basic Metabolic Panel:  Recent Labs Lab 10/17/14 1550  NA 139  K 4.6  CL 109  CO2 23  GLUCOSE 93  BUN 24*  CREATININE 2.78*  CALCIUM 8.9   Liver Function Tests:  Recent Labs Lab 10/17/14 1856  AST 53*  ALT 129*  ALKPHOS 102  BILITOT 0.7  PROT 5.6*  ALBUMIN 3.3*   No results for input(s): LIPASE, AMYLASE in the last 168 hours. No results for input(s): AMMONIA in the last 168 hours. CBC:  Recent Labs Lab 10/17/14 1550  WBC 8.2  HGB 13.9  HCT 43.4  MCV 97.3  PLT 104*   Cardiac Enzymes: No results for input(s): CKTOTAL, CKMB, CKMBINDEX, TROPONINI in the last 168 hours. BNP: Invalid input(s): POCBNP CBG:  Recent Labs Lab 10/17/14 1543  GLUCAP 82    Radiological Exams on Admission: Dg Chest 2 View  10/17/2014   CLINICAL DATA:  Shortness of breath.  Weakness  EXAM: CHEST  2 VIEW  COMPARISON:  None.  FINDINGS: The heart size is enlarged. Both lungs are clear. The visualized skeletal structures are unremarkable.  IMPRESSION: No active cardiopulmonary disease.   Electronically Signed   By: Kerby Moors M.D.   On: 10/17/2014 19:30   Nm Pulmonary Perf And Vent  10/17/2014   CLINICAL DATA:  Shortness of breath since 09/1914. DVT right lower extremity. Positive D-dimer.  EXAM: NUCLEAR MEDICINE VENTILATION - PERFUSION LUNG SCAN  TECHNIQUE: Ventilation images were obtained in multiple projections using inhaled aerosol Tc-78m DTPA. Perfusion images were obtained in multiple projections after intravenous injection of Tc-89m MAA.  RADIOPHARMACEUTICALS:  41.6 mCi Technetium-86m DTPA aerosol inhalation and 6.3 mCi Technetium-20m MAA  IV  COMPARISON:  Chest 10/17/2014  FINDINGS: Ventilation: Focal areas of decreased ventilation demonstrated in the left upper lung and right lower lung.  Perfusion: Focal matched areas of decreased profusion demonstrated in the left upper lung and right lower lung. No wedge shaped peripheral perfusion defects to suggest acute pulmonary embolism.  IMPRESSION: Low probability of pulmonary embolus.   Electronically Signed   By: Lucienne Capers M.D.   On: 10/17/2014 22:18    EKG: Independently reviewed.  Time spent: 80 min  Nil Bolser M. [facility] Pager [phone]  If 7PM-7AM, please contact night-coverage [email or web address] Password [facility] 10/18/2014, 12:27 AM

## 2014-10-18 NOTE — Telephone Encounter (Signed)
Left message on machine for patient to contact the office.   

## 2014-11-08 ENCOUNTER — Encounter (HOSPITAL_COMMUNITY): Payer: Self-pay | Admitting: Emergency Medicine

## 2014-11-08 ENCOUNTER — Observation Stay (HOSPITAL_COMMUNITY)
Admission: EM | Admit: 2014-11-08 | Discharge: 2014-11-10 | Disposition: A | Discharge status: Home / Self Care | Payer: Medicare Other | Attending: Internal Medicine | Admitting: Internal Medicine

## 2014-11-08 DIAGNOSIS — K648 Other hemorrhoids: Secondary | ICD-10-CM | POA: Diagnosis not present

## 2014-11-08 DIAGNOSIS — Z6832 Body mass index (BMI) 32.0-32.9, adult: Secondary | ICD-10-CM | POA: Diagnosis not present

## 2014-11-08 DIAGNOSIS — I2510 Atherosclerotic heart disease of native coronary artery without angina pectoris: Secondary | ICD-10-CM | POA: Insufficient documentation

## 2014-11-08 DIAGNOSIS — Z87891 Personal history of nicotine dependence: Secondary | ICD-10-CM | POA: Diagnosis not present

## 2014-11-08 DIAGNOSIS — K219 Gastro-esophageal reflux disease without esophagitis: Secondary | ICD-10-CM | POA: Diagnosis present

## 2014-11-08 DIAGNOSIS — D124 Benign neoplasm of descending colon: Secondary | ICD-10-CM | POA: Insufficient documentation

## 2014-11-08 DIAGNOSIS — E785 Hyperlipidemia, unspecified: Secondary | ICD-10-CM | POA: Insufficient documentation

## 2014-11-08 DIAGNOSIS — N183 Chronic kidney disease, stage 3 unspecified: Secondary | ICD-10-CM | POA: Diagnosis present

## 2014-11-08 DIAGNOSIS — I82411 Acute embolism and thrombosis of right femoral vein: Secondary | ICD-10-CM | POA: Diagnosis not present

## 2014-11-08 DIAGNOSIS — F329 Major depressive disorder, single episode, unspecified: Secondary | ICD-10-CM | POA: Diagnosis not present

## 2014-11-08 DIAGNOSIS — K625 Hemorrhage of anus and rectum: Secondary | ICD-10-CM | POA: Diagnosis not present

## 2014-11-08 DIAGNOSIS — I10 Essential (primary) hypertension: Secondary | ICD-10-CM | POA: Diagnosis present

## 2014-11-08 DIAGNOSIS — I252 Old myocardial infarction: Secondary | ICD-10-CM | POA: Diagnosis not present

## 2014-11-08 DIAGNOSIS — E669 Obesity, unspecified: Secondary | ICD-10-CM | POA: Insufficient documentation

## 2014-11-08 DIAGNOSIS — D123 Benign neoplasm of transverse colon: Secondary | ICD-10-CM | POA: Insufficient documentation

## 2014-11-08 DIAGNOSIS — I5022 Chronic systolic (congestive) heart failure: Secondary | ICD-10-CM | POA: Diagnosis not present

## 2014-11-08 DIAGNOSIS — Z7901 Long term (current) use of anticoagulants: Secondary | ICD-10-CM | POA: Diagnosis not present

## 2014-11-08 DIAGNOSIS — K5900 Constipation, unspecified: Secondary | ICD-10-CM | POA: Diagnosis not present

## 2014-11-08 DIAGNOSIS — F419 Anxiety disorder, unspecified: Secondary | ICD-10-CM | POA: Diagnosis not present

## 2014-11-08 DIAGNOSIS — I82511 Chronic embolism and thrombosis of right femoral vein: Secondary | ICD-10-CM | POA: Diagnosis not present

## 2014-11-08 DIAGNOSIS — R109 Unspecified abdominal pain: Secondary | ICD-10-CM

## 2014-11-08 DIAGNOSIS — R739 Hyperglycemia, unspecified: Secondary | ICD-10-CM | POA: Diagnosis present

## 2014-11-08 DIAGNOSIS — I129 Hypertensive chronic kidney disease with stage 1 through stage 4 chronic kidney disease, or unspecified chronic kidney disease: Secondary | ICD-10-CM | POA: Insufficient documentation

## 2014-11-08 LAB — HEPATIC FUNCTION PANEL
ALT: 18 U/L (ref 17–63)
AST: 22 U/L (ref 15–41)
Albumin: 3.8 g/dL (ref 3.5–5.0)
Alkaline Phosphatase: 81 U/L (ref 38–126)
TOTAL PROTEIN: 6.4 g/dL — AB (ref 6.5–8.1)
Total Bilirubin: 0.6 mg/dL (ref 0.3–1.2)

## 2014-11-08 LAB — CBC
HCT: 47.2 % (ref 39.0–52.0)
HCT: 45.3 % (ref 39.0–52.0)
HEMOGLOBIN: 15 g/dL (ref 13.0–17.0)
HEMOGLOBIN: 14.6 g/dL (ref 13.0–17.0)
MCH: 30.7 pg (ref 26.0–34.0)
MCH: 30.8 pg (ref 26.0–34.0)
MCHC: 31.8 g/dL (ref 30.0–36.0)
MCHC: 32.2 g/dL (ref 30.0–36.0)
MCV: 96.5 fL (ref 78.0–100.0)
MCV: 95.6 fL (ref 78.0–100.0)
PLATELETS: 160 10*3/uL (ref 150–400)
PLATELETS: 140 10*3/uL — AB (ref 150–400)
RBC: 4.89 MIL/uL (ref 4.22–5.81)
RBC: 4.74 MIL/uL (ref 4.22–5.81)
RDW: 14 % (ref 11.5–15.5)
RDW: 14 % (ref 11.5–15.5)
WBC: 6.5 10*3/uL (ref 4.0–10.5)
WBC: 6.7 10*3/uL (ref 4.0–10.5)

## 2014-11-08 LAB — COMPREHENSIVE METABOLIC PANEL
ALK PHOS: 86 U/L (ref 38–126)
ALT: 19 U/L (ref 17–63)
ANION GAP: 9 (ref 5–15)
AST: 22 U/L (ref 15–41)
Albumin: 4.1 g/dL (ref 3.5–5.0)
BILIRUBIN TOTAL: 0.6 mg/dL (ref 0.3–1.2)
BUN: 22 mg/dL — ABNORMAL HIGH (ref 6–20)
CALCIUM: 10.6 mg/dL — AB (ref 8.9–10.3)
CO2: 22 mmol/L (ref 22–32)
CREATININE: 2.14 mg/dL — AB (ref 0.61–1.24)
Chloride: 108 mmol/L (ref 101–111)
GFR calc non Af Amer: 32 mL/min — ABNORMAL LOW (ref >60)
GFR, EST AFRICAN AMERICAN: 37 mL/min — AB (ref >60)
GLUCOSE: 116 mg/dL — AB (ref 65–99)
Potassium: 4.1 mmol/L (ref 3.5–5.1)
SODIUM: 139 mmol/L (ref 135–145)
TOTAL PROTEIN: 6.7 g/dL (ref 6.5–8.1)

## 2014-11-08 LAB — TYPE AND SCREEN
ABO/RH(D): O POS
ANTIBODY SCREEN: NEGATIVE

## 2014-11-08 LAB — ABO/RH: ABO/RH(D): O POS

## 2014-11-08 LAB — PROTIME-INR
INR: 2.4 — ABNORMAL HIGH (ref 0.00–1.49)
Prothrombin Time: 25.8 seconds — ABNORMAL HIGH (ref 11.6–15.2)

## 2014-11-08 LAB — POC OCCULT BLOOD, ED: Fecal Occult Bld: POSITIVE — AB

## 2014-11-08 LAB — MAGNESIUM: MAGNESIUM: 1.6 mg/dL — AB (ref 1.7–2.4)

## 2014-11-08 MED ORDER — ACETAMINOPHEN 650 MG RE SUPP: 650.0000 mg | Freq: Four times a day (QID) | RECTAL | Status: DC | PRN

## 2014-11-08 MED ORDER — METOPROLOL TARTRATE 100 MG PO TABS
200.0000 mg | ORAL_TABLET | Freq: Two times a day (BID) | ORAL | Status: DC
  Administered 2014-11-08 – 2014-11-09 (×3): 200 mg via ORAL
  Filled 2014-11-08 (×3): qty 2

## 2014-11-08 MED ORDER — ISOSORBIDE MONONITRATE ER 60 MG PO TB24
60.0000 mg | ORAL_TABLET | Freq: Every morning | ORAL | Status: DC
  Administered 2014-11-09 – 2014-11-10 (×2): 60 mg via ORAL
  Filled 2014-11-08 (×2): qty 1

## 2014-11-08 MED ORDER — PANTOPRAZOLE SODIUM 40 MG PO TBEC
40.0000 mg | DELAYED_RELEASE_TABLET | Freq: Two times a day (BID) | ORAL | Status: DC
  Administered 2014-11-08 – 2014-11-09 (×3): 40 mg via ORAL
  Filled 2014-11-08 (×3): qty 1

## 2014-11-08 MED ORDER — BUPROPION HCL 100 MG PO TABS
200.0000 mg | ORAL_TABLET | Freq: Two times a day (BID) | ORAL | Status: DC
  Administered 2014-11-08 – 2014-11-09 (×3): 200 mg via ORAL
  Filled 2014-11-08 (×5): qty 2

## 2014-11-08 MED ORDER — HYDRALAZINE HCL 50 MG PO TABS
50.0000 mg | ORAL_TABLET | Freq: Three times a day (TID) | ORAL | Status: DC
Start: 2014-11-08 — End: 2014-11-10
  Administered 2014-11-08 – 2014-11-10 (×5): 50 mg via ORAL
  Filled 2014-11-08 (×12): qty 1

## 2014-11-08 MED ORDER — SODIUM CHLORIDE 0.9 % IV BOLUS (SEPSIS)
1000.0000 mL | Freq: Once | INTRAVENOUS | Status: AC
  Administered 2014-11-08: 1000 mL via INTRAVENOUS

## 2014-11-08 MED ORDER — ONDANSETRON HCL 4 MG/2ML IJ SOLN
4.0000 mg | Freq: Four times a day (QID) | INTRAMUSCULAR | Status: DC | PRN
  Administered 2014-11-10: 4 mg via INTRAVENOUS
  Filled 2014-11-08: qty 2

## 2014-11-08 MED ORDER — HYOSCYAMINE SULFATE 0.125 MG SL SUBL
0.2500 mg | SUBLINGUAL_TABLET | SUBLINGUAL | Status: DC | PRN
  Administered 2014-11-09 (×2): 0.25 mg via SUBLINGUAL
  Filled 2014-11-08 (×4): qty 2

## 2014-11-08 MED ORDER — SIMVASTATIN 20 MG PO TABS
20.0000 mg | ORAL_TABLET | Freq: Every day | ORAL | Status: DC
  Administered 2014-11-08 – 2014-11-09 (×2): 20 mg via ORAL
  Filled 2014-11-08: qty 1

## 2014-11-08 MED ORDER — QUETIAPINE FUMARATE 400 MG PO TABS
400.0000 mg | ORAL_TABLET | Freq: Every day | ORAL | Status: DC
  Administered 2014-11-08 – 2014-11-09 (×2): 400 mg via ORAL
  Filled 2014-11-08 (×3): qty 1

## 2014-11-08 MED ORDER — ARIPIPRAZOLE 10 MG PO TABS
20.0000 mg | ORAL_TABLET | Freq: Two times a day (BID) | ORAL | Status: DC
Start: 2014-11-08 — End: 2014-11-10
  Administered 2014-11-08 – 2014-11-10 (×3): 20 mg via ORAL
  Filled 2014-11-08 (×6): qty 2

## 2014-11-08 MED ORDER — SERTRALINE HCL 50 MG PO TABS
150.0000 mg | ORAL_TABLET | Freq: Every day | ORAL | Status: DC
  Administered 2014-11-09 – 2014-11-10 (×2): 150 mg via ORAL
  Filled 2014-11-08 (×4): qty 1

## 2014-11-08 MED ORDER — SODIUM CHLORIDE 0.9 % IV SOLN
INTRAVENOUS | Status: DC
  Administered 2014-11-08: 21:00:00 via INTRAVENOUS

## 2014-11-08 MED ORDER — LATANOPROST 0.005 % OP SOLN
1.0000 [drp] | Freq: Every day | OPHTHALMIC | Status: DC
  Administered 2014-11-08 – 2014-11-09 (×2): 1 [drp] via OPHTHALMIC
  Filled 2014-11-08: qty 2.5

## 2014-11-08 MED ORDER — ONDANSETRON HCL 4 MG PO TABS
4.0000 mg | ORAL_TABLET | Freq: Four times a day (QID) | ORAL | Status: DC | PRN
  Administered 2014-11-10: 4 mg via ORAL
  Filled 2014-11-08: qty 1

## 2014-11-08 MED ORDER — LORAZEPAM 1 MG PO TABS: 1.0000 mg | ORAL_TABLET | Freq: Two times a day (BID) | ORAL | Status: DC | PRN

## 2014-11-08 MED ORDER — SPIRONOLACTONE 25 MG PO TABS
50.0000 mg | ORAL_TABLET | Freq: Every day | ORAL | Status: DC
  Administered 2014-11-08 – 2014-11-09 (×2): 50 mg via ORAL
  Filled 2014-11-08 (×2): qty 2

## 2014-11-08 MED ORDER — ACETAMINOPHEN 325 MG PO TABS: 650.0000 mg | ORAL_TABLET | Freq: Four times a day (QID) | ORAL | Status: DC | PRN

## 2014-11-08 NOTE — ED Provider Notes (Signed)
CSN: [id number]     Arrival date & time 11/08/14  1419 History   First MD Initiated Contact with Patient 11/08/14 1615     Chief Complaint  Patient presents with  . Rectal Bleeding     (Consider location/radiation/quality/duration/timing/severity/associated sxs/prior Treatment) HPI.... Rectal bleeding last night and today described as red blood on his stool and in the toilet water.  Patient is presently on Xarelto secondary to a right lower extremity DVT diagnosed 2-3 weeks ago. His primary care doctor is in [place] and he also goes to the [place] system.  His other health problems including depression, schizophrenia, coronary artery disease, MI, chronic kidney disease, CHF.  He is able to ambulate. No chest pain, dyspnea, fever, sweats, chills.  Past Medical History  Diagnosis Date  . Acute myocardial infarction, subendocardial infarction, initial episode of care   . Other primary cardiomyopathies   . Esophageal reflux   . Depressive disorder, not elsewhere classified   . Gout, unspecified   . Unspecified schizophrenia, unspecified condition   . Encounter for long-term (current) use of other medications   . Personal history of tobacco use, presenting hazards to health   . Hypotension, unspecified   . Mitral valve disorders     moderate MR  . Coronary atherosclerosis of native coronary artery   . Hypertension   . Other and unspecified hyperlipidemia   . Torn ligament   . Ischemic cardiomyopathy   . Hx of echocardiogram 2014    EF 40%  . CKD (chronic kidney disease) stage 3, GFR 30-59 ml/min     baseline Cr around 2.5  . 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 CHF (congestive heart failure)    Past Surgical History  Procedure Laterality Date  . None    . Colonoscopy    . Eye surgery     Family History  Problem Relation Age of Onset  . Family history unknown: Yes   Social History   Substance Use Topics  . Smoking status: Former Smoker -- 1.00 packs/day for 23 years    Types: Cigarettes    Quit date: 02/25/1996  . Smokeless tobacco: Never Used     Comment: + 15 years of smoking  . Alcohol Use: No    Review of Systems  All other systems reviewed and are negative.     Allergies  Nifedipine; Benazepril; Haloperidol and related; and Omeprazole  Home Medications   Prior to Admission medications   Medication Sig Start Date End Date Taking? Authorizing Provider  amLODipine (NORVASC) 10 MG tablet Take 10 mg by mouth daily.   Yes Historical Provider, MD  ARIPiprazole (ABILIFY) 20 MG tablet Take 20 mg by mouth 2 (two) times daily.   Yes Historical Provider, MD  buPROPion (WELLBUTRIN) 100 MG tablet Take 200 mg by mouth 2 (two) times daily.    Yes Historical Provider, MD  Cholecalciferol 1000 UNITS capsule Take 2,000 Units by mouth daily.   Yes Historical Provider, MD  hydrALAZINE (APRESOLINE) 100 MG tablet Take 100 mg by mouth 2 (two) times daily.   Yes Historical Provider, MD  HYDROcodone-acetaminophen (NORCO/VICODIN) 5-325 MG per tablet Take 1 tablet by mouth every 6 (six) hours as needed for severe pain.  07/21/14  Yes Historical Provider, MD  isosorbide mononitrate (IMDUR) 60 MG 24 hr tablet Take 1 tablet (60 mg total) by mouth every morning. 08/25/14  Yes Wellington Hampshire, MD  latanoprost (  XALATAN) 0.005 % ophthalmic solution Place 1 drop into both eyes at bedtime.    Yes Historical Provider, MD  LORazepam (ATIVAN) 1 MG tablet Take 2 mg by mouth at bedtime.    Yes Historical Provider, MD  metoprolol (LOPRESSOR) 100 MG tablet Take 200 mg by mouth 2 (two) times daily.    Yes Historical Provider, MD  nitroGLYCERIN (NITROSTAT) 0.4 MG SL tablet Place 1 tablet (0.4 mg total) under the tongue every 5 (five) minutes as needed for chest pain. 03/21/11 11/08/14 Yes Wellington Hampshire, MD  pantoprazole (PROTONIX) 40 MG tablet Take 40 mg by mouth daily.   Yes Historical Provider, MD   QUEtiapine (SEROQUEL) 400 MG tablet Take 400 mg by mouth at bedtime.   Yes Historical Provider, MD  ranitidine (ZANTAC) 150 MG tablet Take 150 mg by mouth 2 (two) times daily as needed for heartburn.    Yes Historical Provider, MD  rivaroxaban (XARELTO) 20 MG TABS tablet Take 20 mg by mouth daily with supper.    Yes Historical Provider, MD  sertraline (ZOLOFT) 100 MG tablet Take 150 mg by mouth daily.    Yes Historical Provider, MD  sildenafil (VIAGRA) 50 MG tablet Take 50 mg by mouth daily as needed for erectile dysfunction.   Yes Historical Provider, MD  simvastatin (ZOCOR) 40 MG tablet Take 1 tablet (40 mg total) by mouth at bedtime. Patient taking differently: Take 20 mg by mouth at bedtime.  07/04/13  Yes Wellington Hampshire, MD  spironolactone (ALDACTONE) 25 MG tablet Take 50 mg by mouth at bedtime.    Yes Historical Provider, MD   BP 159/92 mmHg  Pulse 87  Temp(Src) 98.4 F (36.9 C) (Oral)  Resp 17  Ht 5\' 9"  (1.753 m)  Wt 220 lb (99.791 kg)  BMI 32.47 kg/m2  SpO2 98% Physical Exam  Constitutional: He is oriented to person, place, and time. He appears well-developed and well-nourished.  HENT:  Head: Normocephalic and atraumatic.  Eyes: Conjunctivae and EOM are normal. Pupils are equal, round, and reactive to light.  Neck: Normal range of motion. Neck supple.  Cardiovascular: Normal rate and regular rhythm.   Pulmonary/Chest: Effort normal and breath sounds normal.  Abdominal: Soft. Bowel sounds are normal.  Genitourinary:  No obvious masses. No red stool. Heme positive.  Musculoskeletal: Normal range of motion.  Neurological: He is alert and oriented to person, place, and time.  Skin: Skin is warm and dry.  Psychiatric: He has a normal mood and affect. His behavior is normal.  Nursing note and vitals reviewed.   ED Course  Procedures (including critical care time) Labs Review Labs Reviewed  COMPREHENSIVE METABOLIC PANEL - Abnormal; Notable for the following:    Glucose,  Bld 116 (*)    BUN 22 (*)    Creatinine, Ser 2.14 (*)    Calcium 10.6 (*)    GFR calc non Af Amer 32 (*)    GFR calc Af Amer 37 (*)    All other components within normal limits  PROTIME-INR - Abnormal; Notable for the following:    Prothrombin Time 25.8 (*)    INR 2.40 (*)    All other components within normal limits  POC OCCULT BLOOD, ED - Abnormal; Notable for the following:    Fecal Occult Bld POSITIVE (*)    All other components within normal limits  CBC  TYPE AND SCREEN  ABO/RH    Imaging Review No results found. I have personally reviewed and evaluated these images and  lab results as part of my medical decision-making.   EKG Interpretation None      MDM   Final diagnoses:  Rectal bleed    Patient is hemodynamically  stable. He has rectal bleeding and is also on Xarelto. Discussed with Dr. Dyann Kief [Hosp], Dr Barth Kirks [GI].  Admit.    Nat Christen, MD 11/08/14 [phone]

## 2014-11-08 NOTE — ED Notes (Signed)
Rectal bleeding started yesterday , 3-4 bowel movements with blood since yesterday. Had DVT approx 2 weeks ago-- is taking XERALTO.

## 2014-11-08 NOTE — H&P (Signed)
[facility] History and Physical  Glenn Hawkins [id number] DOB: 07-Feb-1956 DOA: 11/08/2014  Referring physician: Dr. Lacinda Axon PCP: Glenda Chroman., MD   Chief Complaint: rectal bleeding   HPI: Glenn Hawkins is a 59 y.o. male with PMH significant for HTN, HLD, systolic CHF (last EF 99991111, GERD, colonic polyps (status post resection approx 3 -4 years ago and recent visit to ED with RLE, where he was diagnosed with right acute DVT and started on xarelto; who presented to ED secondary to rectal bleeding. Patient reports BRBPR and blood with his stools for the last 36 hours. Had about 3 episode overnight and also 2 episodes on day of admission. No CP, no palpitations, no abd pain, no hematemesis, no melena and no dysuria or hematuria. Patient in ED was found to be FOBT positive and with Hgb of 15.0. TR[facility] called to admit due to rectal bleeding and need for anticoagulation.    Review of Systems:  Positive for intermittent episodes of constipation and diarrhea. Otherwise negative except as mentioned otherwise on HPI.  Past Medical History  Diagnosis Date  . Acute myocardial infarction, subendocardial infarction, initial episode of care   . Other primary cardiomyopathies   . Esophageal reflux   . Depressive disorder, not elsewhere classified   . Gout, unspecified   . Unspecified schizophrenia, unspecified condition   . Encounter for long-term (current) use of other medications   . Personal history of tobacco use, presenting hazards to health   . Hypotension, unspecified   . Mitral valve disorders     moderate MR  . Coronary atherosclerosis of native coronary artery   . Hypertension   . Other and unspecified hyperlipidemia   . Torn ligament   . Ischemic cardiomyopathy   . Hx of echocardiogram 2014    EF 40%  . CKD (chronic kidney disease) stage 3, GFR 30-59 ml/min     baseline Cr around 2.5  . History of nuclear stress test 2013    large transmural  infarct in the left circumflex distribution without significant ischemia  . CHF (congestive heart failure)    Past Surgical History  Procedure Laterality Date  . None    . Colonoscopy    . Eye surgery     Social History:  reports that he quit smoking about 18 years ago. His smoking use included Cigarettes. He has a 23 pack-year smoking history. He has never used smokeless tobacco. He reports that he does not drink alcohol or use illicit drugs.  Allergies  Allergen Reactions  . Nifedipine Diarrhea  . Benazepril Other (See Comments)  . Haloperidol And Related Other (See Comments)  . Omeprazole Other (See Comments)    Family History  Problem Relation Age of Onset  . Family history unknown: HTN and cholesterol; no hx of colon cancer    Prior to Admission medications   Medication Sig Start Date End Date Taking? Authorizing Provider  amLODipine (NORVASC) 10 MG tablet Take 10 mg by mouth daily.   Yes Historical Provider, MD  ARIPiprazole (ABILIFY) 20 MG tablet Take 20 mg by mouth 2 (two) times daily.   Yes Historical Provider, MD  buPROPion (WELLBUTRIN) 100 MG tablet Take 200 mg by mouth 2 (two) times daily.    Yes Historical Provider, MD  Cholecalciferol 1000 UNITS capsule Take 2,000 Units by mouth daily.   Yes Historical Provider, MD  hydrALAZINE (APRESOLINE) 100 MG tablet Take 100 mg by mouth 2 (two) times daily.   Yes Historical Provider, MD  HYDROcodone-acetaminophen (  NORCO/VICODIN) 5-325 MG per tablet Take 1 tablet by mouth every 6 (six) hours as needed for severe pain.  07/21/14  Yes Historical Provider, MD  isosorbide mononitrate (IMDUR) 60 MG 24 hr tablet Take 1 tablet (60 mg total) by mouth every morning. 08/25/14  Yes Wellington Hampshire, MD  latanoprost (XALATAN) 0.005 % ophthalmic solution Place 1 drop into both eyes at bedtime.    Yes Historical Provider, MD  LORazepam (ATIVAN) 1 MG tablet Take 2 mg by mouth at bedtime.    Yes Historical Provider, MD  metoprolol (LOPRESSOR) 100 MG  tablet Take 200 mg by mouth 2 (two) times daily.    Yes Historical Provider, MD  nitroGLYCERIN (NITROSTAT) 0.4 MG SL tablet Place 1 tablet (0.4 mg total) under the tongue every 5 (five) minutes as needed for chest pain. 03/21/11 11/08/14 Yes Wellington Hampshire, MD  pantoprazole (PROTONIX) 40 MG tablet Take 40 mg by mouth daily.   Yes Historical Provider, MD  QUEtiapine (SEROQUEL) 400 MG tablet Take 400 mg by mouth at bedtime.   Yes Historical Provider, MD  ranitidine (ZANTAC) 150 MG tablet Take 150 mg by mouth 2 (two) times daily as needed for heartburn.    Yes Historical Provider, MD  rivaroxaban (XARELTO) 20 MG TABS tablet Take 20 mg by mouth daily with supper.    Yes Historical Provider, MD  sertraline (ZOLOFT) 100 MG tablet Take 150 mg by mouth daily.    Yes Historical Provider, MD  sildenafil (VIAGRA) 50 MG tablet Take 50 mg by mouth daily as needed for erectile dysfunction.   Yes Historical Provider, MD  simvastatin (ZOCOR) 40 MG tablet Take 1 tablet (40 mg total) by mouth at bedtime. Patient taking differently: Take 20 mg by mouth at bedtime.  07/04/13  Yes Wellington Hampshire, MD  spironolactone (ALDACTONE) 25 MG tablet Take 50 mg by mouth at bedtime.    Yes Historical Provider, MD   Physical Exam: Filed Vitals:   11/08/14 1800 11/08/14 1815 11/08/14 1830 11/08/14 1845  BP: 157/88 167/90 148/80 158/87  Pulse: 87 89 88 86  Temp:      TempSrc:      Resp: 15 25 17 13   Height:      Weight:      SpO2: 97% 99% 97% 96%    Wt Readings from Last 3 Encounters:  11/08/14 99.791 kg (220 lb)  10/17/14 101.152 kg (223 lb)  07/28/14 102.74 kg (226 lb 8 oz)    General:  Appears calm and in no distress; VSS, no fever, no nausea, no vomiting Eyes: PERRL, normal lids, irises & conjunctiva, no icterus, no nystagmus ENT: grossly normal hearing, lips & tongue, no erythema or exudates; no drainage out of ears or nostrils  Neck: no LAD, masses or thyromegaly, no JVD Cardiovascular: RRR, no rubs, no  gallops, no murmurs. S1 and S2 Respiratory: CTA bilaterally, no w/r/r. Normal respiratory effort. Abdomen: soft, slightly distended, positive tenderness mid abd and LLQ; no guarding; positive BS Skin: no rash or induration seen on exam Musculoskeletal: grossly normal muscle tone, RLE with trace edema and pain to palpation post calf and popliteal area; good pulses bilaterally, no abnormalities on his LLE. Psychiatric: grossly normal mood and affect, speech fluent and appropriate Neurologic: grossly non-focal.          Labs on Admission:  Basic Metabolic Panel:  Recent Labs Lab 11/08/14 1446  NA 139  K 4.1  CL 108  CO2 22  GLUCOSE 116*  BUN 22*  CREATININE 2.14*  CALCIUM 10.6*   Liver Function Tests:  Recent Labs Lab 11/08/14 1446  AST 22  ALT 19  ALKPHOS 86  BILITOT 0.6  PROT 6.7  ALBUMIN 4.1   CBC:  Recent Labs Lab 11/08/14 1446  WBC 6.5  HGB 15.0  HCT 47.2  MCV 96.5  PLT 160   BNP (last 3 results)  Recent Labs  10/17/14 1856  BNP 1052.4*   CBG: No results for input(s): GLUCAP in the last 168 hours.  Radiological Exams on Admission: No results found.  EKG:  None   Assessment/Plan 1-Rectal bleed: most likely due to internal hemorrhoids, AVM's and/or diverticulosis -patient reports associated mid abd pain and LLQ -no fever, normal WBC's, reports some increase straining and some constipation with intermittent diarrhea. -will admit to med surg -GI has been consulted -will hold anticoagulation -check CT abd -follow Hgb trend -gentle IVF overnight (had hx of systolic CHF) -will continue protonix, but BID now -CLD  2- essential Hypertension: overall stable elevated -will continue current antihypertensive regimen; but will hold norvasc and decrease hydralazine -will monitor and adjust as needed  3-Hyperlipidemia: will continue statins and will check FLP  4-Chronic systolic heart failure: compensated -last EF 40-45% -will continue metoprolol  and spironolactone -daily weight -strict intake and output  5-Acute deep vein thrombosis (DVT) of femoral vein of right lower extremity: diagnosed 2 weeks ago -discharge on xarelto -swelling has improved -still with some pain -will resume anticoagulation once life threatening bleeding r/o  6-CKD (chronic kidney disease) stage 3, GFR 30-59 ml/min: -stable and at baseline -Will monitor trend  7-Hyperglycemia: w/o hx of diabetes -will check A1C -repeat fasting CBG's in am  8-GERD without esophagitis: will continue PPI, but changed to BID  9-Obesity: Body mass index is 32.47 kg/(m^2). -patient educated about importance of low calorie diet and exercise  10-depression/anxiety: stable -no SI or hallucinations -will continue home psych medications regimen   GI : Dr. Fuller Plan; gastroenterology service will see patient in am  Code Status: Full DVT Prophylaxis:patient was on xarelto; stopped due to acute bleeding; recent DVT and complaining of pain for use of SCD's Family Communication: sister and daughter at bedside Disposition Plan: will admit to med-surg; observation status, LOS <  2 midnights   Time spent: 24 minutes  [place], [facility] Pager [phone]

## 2014-11-09 ENCOUNTER — Encounter (HOSPITAL_COMMUNITY): Payer: Self-pay | Admitting: *Deleted

## 2014-11-09 ENCOUNTER — Inpatient Hospital Stay (HOSPITAL_COMMUNITY): Payer: Medicare Other

## 2014-11-09 DIAGNOSIS — K219 Gastro-esophageal reflux disease without esophagitis: Secondary | ICD-10-CM

## 2014-11-09 DIAGNOSIS — E785 Hyperlipidemia, unspecified: Secondary | ICD-10-CM

## 2014-11-09 DIAGNOSIS — I5022 Chronic systolic (congestive) heart failure: Secondary | ICD-10-CM | POA: Diagnosis not present

## 2014-11-09 DIAGNOSIS — I82411 Acute embolism and thrombosis of right femoral vein: Secondary | ICD-10-CM | POA: Diagnosis not present

## 2014-11-09 DIAGNOSIS — I10 Essential (primary) hypertension: Secondary | ICD-10-CM

## 2014-11-09 DIAGNOSIS — K625 Hemorrhage of anus and rectum: Principal | ICD-10-CM

## 2014-11-09 LAB — CBC
HCT: 41.3 % (ref 39.0–52.0)
HEMATOCRIT: 46.4 % (ref 39.0–52.0)
HEMATOCRIT: 41.6 % (ref 39.0–52.0)
HEMATOCRIT: 43 % (ref 39.0–52.0)
HEMOGLOBIN: 14.6 g/dL (ref 13.0–17.0)
HEMOGLOBIN: 13.1 g/dL (ref 13.0–17.0)
HEMOGLOBIN: 13.7 g/dL (ref 13.0–17.0)
Hemoglobin: 13.1 g/dL (ref 13.0–17.0)
MCH: 30.4 pg (ref 26.0–34.0)
MCH: 30.3 pg (ref 26.0–34.0)
MCH: 30.6 pg (ref 26.0–34.0)
MCH: 30.3 pg (ref 26.0–34.0)
MCHC: 31.5 g/dL (ref 30.0–36.0)
MCHC: 31.5 g/dL (ref 30.0–36.0)
MCHC: 31.9 g/dL (ref 30.0–36.0)
MCHC: 31.7 g/dL (ref 30.0–36.0)
MCV: 96.5 fL (ref 78.0–100.0)
MCV: 96.1 fL (ref 78.0–100.0)
MCV: 96.2 fL (ref 78.0–100.0)
MCV: 95.4 fL (ref 78.0–100.0)
PLATELETS: 148 10*3/uL — AB (ref 150–400)
PLATELETS: 143 10*3/uL — AB (ref 150–400)
Platelets: 148 10*3/uL — ABNORMAL LOW (ref 150–400)
Platelets: 148 10*3/uL — ABNORMAL LOW (ref 150–400)
RBC: 4.81 MIL/uL (ref 4.22–5.81)
RBC: 4.33 MIL/uL (ref 4.22–5.81)
RBC: 4.47 MIL/uL (ref 4.22–5.81)
RBC: 4.33 MIL/uL (ref 4.22–5.81)
RDW: 14.3 % (ref 11.5–15.5)
RDW: 14.1 % (ref 11.5–15.5)
RDW: 14.1 % (ref 11.5–15.5)
RDW: 14.1 % (ref 11.5–15.5)
WBC: 5.8 10*3/uL (ref 4.0–10.5)
WBC: 6.2 10*3/uL (ref 4.0–10.5)
WBC: 7.1 10*3/uL (ref 4.0–10.5)
WBC: 6.5 10*3/uL (ref 4.0–10.5)

## 2014-11-09 LAB — BASIC METABOLIC PANEL
Anion gap: 6 (ref 5–15)
BUN: 17 mg/dL (ref 6–20)
CHLORIDE: 109 mmol/L (ref 101–111)
CO2: 25 mmol/L (ref 22–32)
Calcium: 9.5 mg/dL (ref 8.9–10.3)
Creatinine, Ser: 2.1 mg/dL — ABNORMAL HIGH (ref 0.61–1.24)
GFR calc non Af Amer: 33 mL/min — ABNORMAL LOW (ref >60)
GFR, EST AFRICAN AMERICAN: 38 mL/min — AB (ref >60)
Glucose, Bld: 93 mg/dL (ref 65–99)
POTASSIUM: 5.1 mmol/L (ref 3.5–5.1)
SODIUM: 140 mmol/L (ref 135–145)

## 2014-11-09 LAB — LIPID PANEL
Cholesterol: 191 mg/dL (ref 0–200)
HDL: 45 mg/dL (ref >40)
LDL CALC: 123 mg/dL — AB (ref 0–99)
Total CHOL/HDL Ratio: 4.2 RATIO
Triglycerides: 117 mg/dL (ref <150)
VLDL: 23 mg/dL (ref 0–40)

## 2014-11-09 LAB — HEMOGLOBIN A1C
Hgb A1c MFr Bld: 6 % — ABNORMAL HIGH (ref 4.8–5.6)
MEAN PLASMA GLUCOSE: 126 mg/dL

## 2014-11-09 LAB — APTT: aPTT: 32 seconds (ref 24–37)

## 2014-11-09 LAB — HEPARIN LEVEL (UNFRACTIONATED): Heparin Unfractionated: 0.76 IU/mL — ABNORMAL HIGH (ref 0.30–0.70)

## 2014-11-09 MED ORDER — IOHEXOL 300 MG/ML  SOLN
25.0000 mL | INTRAMUSCULAR | Status: AC
  Administered 2014-11-09: 25 mL via ORAL

## 2014-11-09 MED ORDER — PEG-KCL-NACL-NASULF-NA ASC-C 100 G PO SOLR: 1.0000 | Freq: Once | ORAL | Status: DC

## 2014-11-09 MED ORDER — PEG-KCL-NACL-NASULF-NA ASC-C 100 G PO SOLR
0.5000 | Freq: Once | ORAL | Status: DC
  Filled 2014-11-09: qty 1

## 2014-11-09 MED ORDER — HEPARIN (PORCINE) IN NACL 100-0.45 UNIT/ML-% IJ SOLN
1400.0000 [IU]/h | INTRAMUSCULAR | Status: DC
Start: 2014-11-09 — End: 2014-11-09

## 2014-11-09 MED ORDER — HEPARIN (PORCINE) IN NACL 100-0.45 UNIT/ML-% IJ SOLN
1400.0000 [IU]/h | INTRAMUSCULAR | Status: AC
  Administered 2014-11-09: 1400 [IU]/h via INTRAVENOUS
  Filled 2014-11-09 (×2): qty 250

## 2014-11-09 MED ORDER — MAGNESIUM SULFATE 2 GM/50ML IV SOLN
2.0000 g | Freq: Once | INTRAVENOUS | Status: AC
  Administered 2014-11-09: 2 g via INTRAVENOUS
  Filled 2014-11-09: qty 50

## 2014-11-09 MED ORDER — PEG-KCL-NACL-NASULF-NA ASC-C 100 G PO SOLR
0.5000 | Freq: Once | ORAL | Status: AC
  Administered 2014-11-10: 100 g via ORAL
  Filled 2014-11-09: qty 1

## 2014-11-09 NOTE — Progress Notes (Signed)
ANTICOAGULATION CONSULT NOTE - Initial Consult  Pharmacy Consult for Heparin  Indication:  Recent RLE DVT (diagnosed 10/17/14)  Allergies  Allergen Reactions  . Nifedipine Diarrhea  . Benazepril Other (See Comments)  . Haloperidol And Related Other (See Comments)  . Omeprazole Other (See Comments)    Patient Measurements: Height: 5\' 8"  (172.7 cm) Weight: 214 lb 11.7 oz (97.4 kg) IBW/kg (Calculated) : 68.4 Heparin Dosing Weight: 89 kg  Vital Signs: Temp: 98.5 F (36.9 C) (09/15 1325) Temp Source: Oral (09/15 1325) BP: 117/78 mmHg (09/15 1325) Pulse Rate: 68 (09/15 1325)  Labs:  Recent Labs  11/08/14 1446 11/08/14 2047 11/09/14 0745 11/09/14 1040  HGB 15.0 14.6 14.6  --   HCT 47.2 45.3 46.4  --   PLT 160 140* 148*  --   LABPROT 25.8*  --   --   --   INR 2.40*  --   --   --   CREATININE 2.14*  --   --  2.10*    Estimated Creatinine Clearance: 43.4 mL/min (by C-G formula based on Cr of 2.1).   Medical History: Past Medical History  Diagnosis Date  . Acute myocardial infarction, subendocardial infarction, initial episode of care   . Other primary cardiomyopathies   . Esophageal reflux   . Depressive disorder, not elsewhere classified   . Gout, unspecified   . Unspecified schizophrenia, unspecified condition   . Encounter for long-term (current) use of other medications   . Personal history of tobacco use, presenting hazards to health   . Hypotension, unspecified   . Mitral valve disorders     moderate MR  . Coronary atherosclerosis of native coronary artery   . Hypertension   . Other and unspecified hyperlipidemia   . Torn ligament   . Ischemic cardiomyopathy   . Hx of echocardiogram 2014    EF 40%  . CKD (chronic kidney disease) stage 3, GFR 30-59 ml/min     baseline Cr around 2.5  . 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 CHF (congestive heart failure)      Medications:  Prescriptions prior to admission  Medication Sig Dispense Refill Last Dose  . amLODipine (NORVASC) 10 MG tablet Take 10 mg by mouth daily.   11/08/2014 at Unknown time  . ARIPiprazole (ABILIFY) 20 MG tablet Take 20 mg by mouth 2 (two) times daily.   11/08/2014 at Unknown time  . buPROPion (WELLBUTRIN) 100 MG tablet Take 200 mg by mouth 2 (two) times daily.    11/08/2014 at Unknown time  . Cholecalciferol 1000 UNITS capsule Take 2,000 Units by mouth daily.   11/08/2014 at Unknown time  . hydrALAZINE (APRESOLINE) 100 MG tablet Take 100 mg by mouth 2 (two) times daily.   11/08/2014 at Unknown time  . HYDROcodone-acetaminophen (NORCO/VICODIN) 5-325 MG per tablet Take 1 tablet by mouth every 6 (six) hours as needed for severe pain.    Past Month at Unknown time  . isosorbide mononitrate (IMDUR) 60 MG 24 hr tablet Take 1 tablet (60 mg total) by mouth every morning. 90 tablet 4 11/08/2014 at Unknown time  . latanoprost (XALATAN) 0.005 % ophthalmic solution Place 1 drop into both eyes at bedtime.    11/07/2014 at Unknown time  . LORazepam (ATIVAN) 1 MG tablet Take 2 mg by mouth at bedtime.    11/07/2014 at Unknown time  . metoprolol (LOPRESSOR) 100 MG tablet Take 200 mg  by mouth 2 (two) times daily.    11/08/2014 at 1000  . nitroGLYCERIN (NITROSTAT) 0.4 MG SL tablet Place 1 tablet (0.4 mg total) under the tongue every 5 (five) minutes as needed for chest pain. 25 tablet 3 not used  . pantoprazole (PROTONIX) 40 MG tablet Take 40 mg by mouth daily.   11/08/2014 at Unknown time  . QUEtiapine (SEROQUEL) 400 MG tablet Take 400 mg by mouth at bedtime.   11/07/2014 at Unknown time  . ranitidine (ZANTAC) 150 MG tablet Take 150 mg by mouth 2 (two) times daily as needed for heartburn.    Past Month at Unknown time  . rivaroxaban (XARELTO) 20 MG TABS tablet Take 20 mg by mouth daily with supper.    11/07/2014 at Unknown time  . sertraline (ZOLOFT) 100 MG tablet Take 150 mg by mouth daily.    11/08/2014 at  Unknown time  . sildenafil (VIAGRA) 50 MG tablet Take 50 mg by mouth daily as needed for erectile dysfunction.   Past Month at Unknown time  . simvastatin (ZOCOR) 40 MG tablet Take 1 tablet (40 mg total) by mouth at bedtime. (Patient taking differently: Take 20 mg by mouth at bedtime. ) 90 tablet 3 11/07/2014 at Unknown time  . spironolactone (ALDACTONE) 25 MG tablet Take 50 mg by mouth at bedtime.    11/07/2014 at Unknown time   Scheduled:  . ARIPiprazole  20 mg Oral BID  . buPROPion  200 mg Oral BID  . hydrALAZINE  50 mg Oral 3 times per day  . isosorbide mononitrate  60 mg Oral q morning - 10a  . latanoprost  1 drop Both Eyes QHS  . metoprolol  200 mg Oral BID  . pantoprazole  40 mg Oral BID  . QUEtiapine  400 mg Oral QHS  . sertraline  150 mg Oral Daily  . simvastatin  20 mg Oral QHS  . spironolactone  50 mg Oral QHS    Assessment: 59 y.o male admitted on 11/08/14 with rectal bleeding on Xarelto. Multiple medical problems as listed in [place] He is on Xarelto for recently diagnosed RLE DVT  (diagnosed 10/17/14). PTA he was taking Xarelto 20mg  daily with supper,  with last dose taken on 11/07/14.  Xarelto held on admit due to rectal bleeding.   Hgb on admit = 15, today Hgb =14.6, Hct 46.4, pltc 148K GI consulted, awaiting CTscan. Possible colonoscopy tomorrow.   It has been >24 hours since last Xarelto dose, thus okay to start IV heparin drip now.    Goal of Therapy:  PTT =66-102 seconds Heparin level 0.3-0.7 units/ml Monitor platelets by anticoagulation protocol: Yes   Plan:  No bolus. Start IV heparin drip 1400 units/hr 6 hour aPTT/HL Daily aPTT/HL Possible colonoscopy tomorrow.    Thank you for allowing pharmacy to be part of this patients care team. Arman Bogus 11/09/2014,2:13 PM

## 2014-11-09 NOTE — Progress Notes (Signed)
[facility] PROGRESS NOTE  Glenn Hawkins MLJ:[id number] DOB: [birth year] DOA: 11/08/2014 PCP: Glenda Chroman., MD  Assessment/Plan: 1-Rectal bleed:  -most likely secondary to internal hemorrhoids, AVM's and/or diverticulosis -patient had reported associated mid abd pain and LLQ -Pt had reported increase straining and some constipation with intermittent diarrhea. -GI following -check CT abd with findings of diverticulosis w/o diverticulitis. Also prominent hemorrhoidal artery noted -will continue protonix, but BID now -Hgb remains very stable  2- essential Hypertension: overall stable elevated -will continue current antihypertensive regimen; but will hold norvasc and decrease hydralazine -will monitor and adjust as needed  3-Hyperlipidemia: will continue statins and will check FLP  4-Chronic systolic heart failure: compensated -last EF 40-45% -continue metoprolol and spironolactone -daily weight -strict intake and output  5-Acute deep vein thrombosis (DVT) of femoral vein of right lower extremity: diagnosed 2 weeks prior to admission -on xarelto prior to admit, held when admitted -risks and benefits to anticoagulation discussed with patient at bedside. Specifically, concerns for extension of clot or even PE. Patient agreed with continuing anticoagulation. Have ordered heparin gtt and will follow serial h/h.   6-CKD (chronic kidney disease) stage 3, GFR 30-59 ml/min: -stable and at baseline -Will monitor trend  7-Hyperglycemia: w/o hx of diabetes -will check A1C -repeat fasting CBG's in am  8-GERD without esophagitis: will continue PPI, but changed to BID  9-Obesity: Body mass index is 32.47 kg/(m^2). -patient educated about importance of low calorie diet and exercise  10-depression/anxiety: stable -no SI or hallucinations -will continue home psych medications regimen  Code Status: Full Family Communication: Pt in room (indicate person spoken with, relationship,  and if by phone, the number) Disposition Plan: Pending   Consultants:  GI  Procedures:    Antibiotics:   (indicate start date, and stop date if known)  HPI/Subjective: Complains of mild abd soreness  Objective: Filed Vitals:   11/08/14 1845 11/08/14 1942 11/09/14 0555 11/09/14 1325  BP: 158/87 147/90 132/78 117/78  Pulse: 86 86 63 68  Temp:  98.4 F (36.9 C) 97.9 F (36.6 C) 98.5 F (36.9 C)  TempSrc:  Oral Oral Oral  Resp: _0 Height:  _1  (1.727 m)    Weight:  97.4 kg (214 lb 11.7 oz) 97.4 kg (214 lb 11.7 oz)   SpO2: 96% 99% 98% 98%    Intake/Output Summary (Last 24 hours) at 11/09/14 1737 Last data filed at 11/09/14 1325  Gross per 24 hour  Intake    720 ml  Output    702 ml  Net     18 ml   Filed Weights   11/08/14 1440 11/08/14 1942 11/09/14 0555  Weight: 99.791 kg (220 lb) 97.4 kg (214 lb 11.7 oz) 97.4 kg (214 lb 11.7 oz)    Exam:   General:  Awake, in nad  Cardiovascular: regular, s1, s2  Respiratory: normal resp effort, no wheezing  Abdomen: soft,nondistended  Musculoskeletal: perfused, no clubbing   Data Reviewed: Basic Metabolic Panel:  Recent Labs Lab 11/08/14 1446 11/08/14 2047 11/09/14 1040  NA 139  --  140  K 4.1  --  5.1  CL 108  --  109  CO2 22  --  25  GLUCOSE 116*  --  93  BUN 22*  --  17  CREATININE 2.14*  --  2.10*  CALCIUM 10.6*  --  9.5  MG  --  1.6*  --    Liver Function Tests:  Recent Labs Lab 11/08/14 1446 11/08/14 2047  AST 22 22  ALT 19 18  ALKPHOS 86 81  BILITOT 0.6 0.6  PROT 6.7 6.4*  ALBUMIN 4.1 3.8   No results for input(s): LIPASE, AMYLASE in the last 168 hours. No results for input(s): AMMONIA in the last 168 hours. CBC:  Recent Labs Lab 11/08/14 1446 11/08/14 2047 11/09/14 0745 11/09/14 1450  WBC 6.5 6.7 5.8 6.2  HGB 15.0 14.6 14.6 13.1  HCT 47.2 45.3 46.4 41.6  MCV 96.5 95.6 96.5 96.1  PLT 160 140* 148* 148*   Cardiac Enzymes: No results for input(s): CKTOTAL,  CKMB, CKMBINDEX, TROPONINI in the last 168 hours. BNP (last 3 results)  Recent Labs  10/17/14 1856  BNP 1052.4*    ProBNP (last 3 results) No results for input(s): PROBNP in the last 8760 hours.  CBG: No results for input(s): GLUCAP in the last 168 hours.  No results found for this or any previous visit (from the past 240 hour(s)).   Studies: Ct Abdomen Pelvis Wo Contrast  11/09/2014   CLINICAL DATA:  Pt c/o abd pain, distension, and positive BRBPR. Past medical Hx; CHF; HTN; Acute myocardial infarction, subendocardial infarction, initial episode of care; Other primary cardiomyopathies; Depressive disorder, not elsewhere classified; Unspecified schizophrenia; Encounter for long-term (current) use of other medications; Hypotension, unspecified; Mitral valve disorders; Coronary atherosclerosis of native coronary artery; Ischemic cardiomyopathy; CKD (chronic kidney disease) stage 3  EXAM: CT ABDOMEN AND PELVIS WITHOUT CONTRAST  TECHNIQUE: Multidetector CT imaging of the abdomen and pelvis was performed following the standard protocol without IV contrast.  COMPARISON:  06/30/2012 04/19/2010  FINDINGS: Lung bases: Minimal subsegmental atelectasis. Otherwise clear. Heart normal in size.  Liver, spleen, gallbladder, pancreas, adrenal glands:  Unremarkable.  Kidneys, ureters, bladder: Bilateral renal cortical thinning. Two low-density masses arise from the left kidney, largest at the midpole measuring 4.2 cm. These are stable. No other renal masses. No convincing intrarenal stones. No hydronephrosis. Ureters normal in course and in caliber. Bladder is unremarkable.  Lymph nodes:  No adenopathy.  Ascites:  None.  Vascular: Prominent superior hemorrhoidal artery, stable from the prior exam. Mild atherosclerotic calcifications along the aorta.  Gastrointestinal: Few scattered colonic diverticula. No diverticulitis. No bowel wall thickening. No inflammatory changes. Small bowel is unremarkable. Stomach is  unremarkable. Normal appendix visualized.  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 This is stable. Degenerative changes noted of the lumbar spine most prominently at L4-L5.  IMPRESSION: 1. No acute findings. 2. Several scattered colonic diverticula are noted without evidence of diverticulitis. 3. There is a prominent superior hemorrhoidal artery arising from a prominent inferior mesenteric artery. This was present on prior exams. The etiology and clinical significance is unclear.   Electronically Signed   By: Lajean Manes M.D.   On: 11/09/2014 13:23    Scheduled Meds: . ARIPiprazole  20 mg Oral BID  . buPROPion  200 mg Oral BID  . hydrALAZINE  50 mg Oral 3 times per day  . isosorbide mononitrate  60 mg Oral q morning - 10a  . latanoprost  1 drop Both Eyes QHS  . metoprolol  200 mg Oral BID  . pantoprazole  40 mg Oral BID  . peg 3350 powder  0.5 kit Oral Once   And  . [START ON 11/10/2014] peg 3350 powder  0.5 kit Oral Once  . QUEtiapine  400 mg Oral QHS  . sertraline  150 mg Oral Daily  . simvastatin  20 mg Oral QHS  .  spironolactone  50 mg Oral QHS   Continuous Infusions: . sodium chloride 50 mL/hr at 11/08/14 2032  . heparin      Principal Problem:   Rectal bleed Active Problems:   Hypertension   Hyperlipidemia   Chronic systolic heart failure   Acute deep vein thrombosis (DVT) of femoral vein of right lower extremity   CKD (chronic kidney disease) stage 3, GFR 30-59 ml/min   Hyperglycemia   GERD without esophagitis   Obesity   Rectal bleeding   [facility], [place] [facility] Pager [phone]. If 7PM-7AM, please contact night-coverage at [email or web address], password [facility] 11/09/2014, 5:37 PM  LOS: 1 day

## 2014-11-09 NOTE — Progress Notes (Signed)
Entered room with charge nurse,Glodean, RN, and explained that pt will need to receive heparin and will be getting his scheduled hydralazine.  Also, informed pt that bowel prep will be started at 6pm and that he is NPO.  Pt stated "I am not going to take anything until I talk to Dr. Wyline Copas."  Informed pt that Dr. Wyline Copas has been made aware and will speak to patient when time permits.  Dr. Wyline Copas made aware.  Will continue to monitor.

## 2014-11-09 NOTE — Care Management Note (Signed)
Case Management Note  Patient Details  Name: Glenn Hawkins MRN: [id number] Date of Birth: 1955/10/14  Subjective/Objective:  Patient lives with mom, pta indep. Patient still drives, has insurance and a pcp.  NCM will cont to follow for dc needs.                  Action/Plan:   Expected Discharge Date:                  Expected Discharge Plan:  Home/Self Care  In-House Referral:     Discharge planning Services  CM Consult  Post Acute Care Choice:    Choice offered to:     DME Arranged:    DME Agency:     HH Arranged:    HH Agency:     Status of Service:  In process, will continue to follow  Medicare Important Message Given:    Date Medicare IM Given:    Medicare IM give by:    Date Additional Medicare IM Given:    Additional Medicare Important Message give by:     If discussed at [place] of Stay Meetings, dates discussed:    Additional Comments:  Zenon Mayo, RN 11/09/2014, 5:01 PM

## 2014-11-09 NOTE — Progress Notes (Signed)
Called to patient's room.  Pt pointed to primary fluids and magnesium "Ever since you hung and gave me that, and that, my chest has felt cold."  Ausculted patient's lungs and abdomen.  Lung sounds clear bilaterally.  Bowel sounds present in all four quadrants.  Explained to pt that the magnesium was an electrolyte to supplement his low level this AM and that he had been receiving the fluids before being given the magnesium.  Pt stated again "I don't know why you don't believe what I am saying.  I told you, ever since you gave me that, my chest has felt cold."  Offered pt a hot pack or a warm blanket, but pt refused.  Asked pt if he felt nauseous, pt stated "I do feel a little nauseous."  Asked if he felt like he needed a ginger ale or wanted to try zofran.  Pt refused and asked the fluids be stopped.  Paused fluids for now and informed pt that a doctor's order would be needed to discontinue fluids completely.  Paged Dr. Wyline Copas.  Will continue to monitor.

## 2014-11-09 NOTE — Consult Note (Signed)
Consultation  Referring Provider:   [facility]  Primary Care Physician:  Glenda Chroman., MD Primary Gastroenterologist:  unassigned       Reason for Consultation: rectal bleeding.             HPI:   Glenn Hawkins is a 59 y.o. male with multiple medical problems as listed in [place]. He presented to ED yesterday with rectal bleeding. Patient on Xarelto for RLE DVT diagnosed 2 weeks ago. Hgb 14.6 in ED, remains same this am.    Patient describes diffuse abdominal discomfort associated with the bleeding. He also describes loose stools over the last few weeks but upon further questioning this doesn't sound unusual for him. No recent antibiotics. Patient has no rectal pain. He gives a history of hemorrhoids. No [place] of colon cancer. Patient states he has had 2-3 colonoscopies for "acid reflux. Polyp may have been removed at time of last colonoscopy but patient not sure.   GERD - takes Zantac and PPI on as needed basis.   Past Medical History  Diagnosis Date  . Acute myocardial infarction, subendocardial infarction, initial episode of care   . Other primary cardiomyopathies   . Esophageal reflux   . Depressive disorder, not elsewhere classified   . Gout, unspecified   . Unspecified schizophrenia, unspecified condition   . Encounter for long-term (current) use of other medications   . Personal history of tobacco use, presenting hazards to health   . Hypotension, unspecified   . Mitral valve disorders     moderate MR  . Coronary atherosclerosis of native coronary artery   . Hypertension   . Other and unspecified hyperlipidemia   . Torn ligament   . Ischemic cardiomyopathy   . Hx of echocardiogram 2014    EF 40%  . CKD (chronic kidney disease) stage 3, GFR 30-59 ml/min     baseline Cr around 2.5  . 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 CHF (congestive heart failure)     Past  Surgical History  Procedure Laterality Date  . None    . Colonoscopy    . Eye surgery       [place]: No colon cancer   Social History  Substance Use Topics  . Smoking status: Former Smoker -- 1.00 packs/day for 23 years    Types: Cigarettes    Quit date: 02/25/1996  . Smokeless tobacco: Never Used     Comment: + 15 years of smoking  . Alcohol Use: No    Prior to Admission medications   Medication Sig Start Date End Date Taking? Authorizing Provider  amLODipine (NORVASC) 10 MG tablet Take 10 mg by mouth daily.   Yes Historical Provider, MD  ARIPiprazole (ABILIFY) 20 MG tablet Take 20 mg by mouth 2 (two) times daily.   Yes Historical Provider, MD  buPROPion (WELLBUTRIN) 100 MG tablet Take 200 mg by mouth 2 (two) times daily.    Yes Historical Provider, MD  Cholecalciferol 1000 UNITS capsule Take 2,000 Units by mouth daily.   Yes Historical Provider, MD  hydrALAZINE (APRESOLINE) 100 MG tablet Take 100 mg by mouth 2 (two) times daily.   Yes Historical Provider, MD  HYDROcodone-acetaminophen (NORCO/VICODIN) 5-325 MG per tablet Take 1 tablet by mouth every 6 (six) hours as needed for severe pain.  07/21/14  Yes Historical Provider, MD  isosorbide mononitrate (IMDUR) 60 MG 24 hr tablet Take  1 tablet (60 mg total) by mouth every morning. 08/25/14  Yes Wellington Hampshire, MD  latanoprost (XALATAN) 0.005 % ophthalmic solution Place 1 drop into both eyes at bedtime.    Yes Historical Provider, MD  LORazepam (ATIVAN) 1 MG tablet Take 2 mg by mouth at bedtime.    Yes Historical Provider, MD  metoprolol (LOPRESSOR) 100 MG tablet Take 200 mg by mouth 2 (two) times daily.    Yes Historical Provider, MD  nitroGLYCERIN (NITROSTAT) 0.4 MG SL tablet Place 1 tablet (0.4 mg total) under the tongue every 5 (five) minutes as needed for chest pain. 03/21/11 11/08/14 Yes Wellington Hampshire, MD  pantoprazole (PROTONIX) 40 MG tablet Take 40 mg by mouth daily.   Yes Historical Provider, MD  QUEtiapine (SEROQUEL) 400 MG  tablet Take 400 mg by mouth at bedtime.   Yes Historical Provider, MD  ranitidine (ZANTAC) 150 MG tablet Take 150 mg by mouth 2 (two) times daily as needed for heartburn.    Yes Historical Provider, MD  rivaroxaban (XARELTO) 20 MG TABS tablet Take 20 mg by mouth daily with supper.    Yes Historical Provider, MD  sertraline (ZOLOFT) 100 MG tablet Take 150 mg by mouth daily.    Yes Historical Provider, MD  sildenafil (VIAGRA) 50 MG tablet Take 50 mg by mouth daily as needed for erectile dysfunction.   Yes Historical Provider, MD  simvastatin (ZOCOR) 40 MG tablet Take 1 tablet (40 mg total) by mouth at bedtime. Patient taking differently: Take 20 mg by mouth at bedtime.  07/04/13  Yes Wellington Hampshire, MD  spironolactone (ALDACTONE) 25 MG tablet Take 50 mg by mouth at bedtime.    Yes Historical Provider, MD    Current Facility-Administered Medications  Medication Dose Route Frequency Provider Last Rate Last Dose  . 0.9 %  sodium chloride infusion   Intravenous Continuous Barton Dubois, MD 50 mL/hr at 11/08/14 2032    . acetaminophen (TYLENOL) tablet 650 mg  650 mg Oral Q6H PRN Barton Dubois, MD       Or  . acetaminophen (TYLENOL) suppository 650 mg  650 mg Rectal Q6H PRN Barton Dubois, MD      . ARIPiprazole (ABILIFY) tablet 20 mg  20 mg Oral BID Barton Dubois, MD   20 mg at 11/08/14 2126  . buPROPion [facility]) tablet 200 mg  200 mg Oral BID Barton Dubois, MD   200 mg at 11/08/14 2125  . hydrALAZINE (APRESOLINE) tablet 50 mg  50 mg Oral 3 times per day Barton Dubois, MD   50 mg at 11/09/14 0558  . hyoscyamine (LEVSIN SL) SL tablet 0.25 mg  0.25 mg Sublingual Q4H PRN Rhetta Mura Schorr, NP   0.25 mg at 11/09/14 0030  . isosorbide mononitrate (IMDUR) 24 hr tablet 60 mg  60 mg Oral q morning - 10a Barton Dubois, MD      . latanoprost (XALATAN) 0.005 % ophthalmic solution 1 drop  1 drop Both Eyes QHS Barton Dubois, MD   1 drop at 11/08/14 2126  . LORazepam (ATIVAN) tablet 1 mg  1 mg Oral Q12H PRN  Barton Dubois, MD      . magnesium sulfate IVPB 2 g 50 mL  2 g Intravenous Once Donne Hazel, MD      . metoprolol (LOPRESSOR) tablet 200 mg  200 mg Oral BID Barton Dubois, MD   200 mg at 11/08/14 2126  . ondansetron (ZOFRAN) tablet 4 mg  4 mg Oral Q6H PRN Clifton James  Dyann Kief, MD       Or  . ondansetron [facility]) injection 4 mg  4 mg Intravenous Q6H PRN Barton Dubois, MD      . pantoprazole (PROTONIX) EC tablet 40 mg  40 mg Oral BID Barton Dubois, MD   40 mg at 11/08/14 2126  . QUEtiapine (SEROQUEL) tablet 400 mg  400 mg Oral QHS Barton Dubois, MD   400 mg at 11/08/14 2125  . sertraline (ZOLOFT) tablet 150 mg  150 mg Oral Daily Barton Dubois, MD      . simvastatin (ZOCOR) tablet 20 mg  20 mg Oral QHS Barton Dubois, MD   20 mg at 11/08/14 2127  . spironolactone (ALDACTONE) tablet 50 mg  50 mg Oral QHS Barton Dubois, MD   50 mg at 11/08/14 2125    Allergies as of 11/08/2014 - Review Complete 11/08/2014  Allergen Reaction Noted  . Nifedipine Diarrhea 12/08/2013  . Benazepril Other (See Comments) 03/07/2011  . Haloperidol and related Other (See Comments) 03/07/2011  . Omeprazole Other (See Comments) 03/07/2011    Review of Systems:    As per HPI, otherwise negative   Physical Exam:  Vital signs in last 24 hours: Temp:  [97.9 F (36.6 C)-98.4 F (36.9 C)] 97.9 F (36.6 C) (09/15 0555) Pulse Rate:  [63-93] 63 (09/15 0555) Resp:  [13-25] 18 (09/15 0555) BP: (132-167)/(78-93) 132/78 mmHg (09/15 0555) SpO2:  [96 %-99 %] 98 % (09/15 0555) Weight:  [214 lb 11.7 oz (97.4 kg)-220 lb (99.791 kg)] 214 lb 11.7 oz (97.4 kg) (09/15 0555) Last BM Date: 11/08/14 General:   Pleasant black male, well developed Head:  Normocephalic and atraumatic. Eyes:   No icterus.   Conjunctiva pink. Ears:  Normal auditory acuity. Neck:  Supple Lungs:  Respirations even and unlabored. Lungs clear to auscultation bilaterally.   No wheezes, crackles, or rhonchi.  Heart:  Regular rate and rhythm Abdomen:  Soft,  nondistended, nontender. Normal bowel sounds. No appreciable masses or hepatomegaly.  Rectal: No external lesions. No stool or blood in vault. Blood on a piece of tissue patient had tucked between buttocks.  Rectal:  Not performed.  Msk:  Symmetrical without gross deformities.  Extremities:  Without edema. Neurologic:  Alert and  oriented x4;  grossly normal neurologically. Skin:  Intact without significant lesions or rashes. Psych:  Alert and cooperative. Normal affect.  LAB RESULTS:  Recent Labs  11/08/14 1446 11/08/14 2047 11/09/14 0745  WBC 6.5 6.7 5.8  HGB 15.0 14.6 14.6  HCT 47.2 45.3 46.4  PLT 160 140* 148*   BMET  Recent Labs  11/08/14 1446  NA 139  K 4.1  CL 108  CO2 22  GLUCOSE 116*  BUN 22*  CREATININE 2.14*  CALCIUM 10.6*   LFT  Recent Labs  11/08/14 2047  PROT 6.4*  ALBUMIN 3.8  AST 22  ALT 18  ALKPHOS 81  BILITOT 0.6  BILIDIR <0.1*  IBILI NOT CALCULATED   PT/INR  Recent Labs  11/08/14 1446  LABPROT 25.8*  INR 2.40*    PREVIOUS ENDOSCOPIES:            Colonoscopy approximately 3 years ago in [place] per patient   Impression / Plan:   32. 59 year old male with rectal bleeding on Xarelto.  No rectal pain but diffuse abdominal discomfort. Difficult to sort out, patient doesn't elaborate. Some loose stools but patient describes as more of a chronic issue. Abdominal exam not concerning. Bleeding may be hemorrhoidal in origin, especially  since hgb hasn't dropped. No obvious fissures. Await CTscan, if negative colonoscopy may be warranted, especially given need for anti-platelet therapy. Patient is agreeable to a colonoscopy. Will place on clear liquids now in preparation for colonoscopy tomorrow.   2. DVT of RLE diagnosed late August. On Xarelto  3. Thrombocytopenia. Count stable at 148.   4.  Chronic systolic heart failure  5. CKD, creatinine 2.1 - about baseline.   Thanks   LOS: 1 day   Tye Savoy  11/09/2014, 9:37  AM    Attending Addendum: I have taken an interval history, reviewed the chart, and examined the patient. I agree with the Advanced Practitioner's note and impression. In short, 59 y/o male with multiple medical problems, recently started Xarelto for DVT and has experienced rectal bleeding. No significant drop in Hgb. Suspect this is hemorrhoidal bleeding, as he has had hemorrhoidal bleeding like this previously, however offered him colonoscopy to clarify the etiology given his need for anticoagulation moving forward. The indications, risks, and benefits of colonoscopy were explained to the patient in detail. Risks include but not limited to bleeding, perforation, adverse reaction to medications, cardiopulmonary compromise. Further recommendations pending results of the exam.   Mingo Cellar, MD [facility] Pager [phone]

## 2014-11-10 ENCOUNTER — Telehealth: Payer: Self-pay | Admitting: *Deleted

## 2014-11-10 ENCOUNTER — Encounter (HOSPITAL_COMMUNITY): Payer: Self-pay | Admitting: *Deleted

## 2014-11-10 ENCOUNTER — Encounter (HOSPITAL_COMMUNITY)
Admission: EM | Disposition: A | Discharge status: Home / Self Care | Payer: Self-pay | Source: Home / Self Care | Attending: Internal Medicine

## 2014-11-10 ENCOUNTER — Encounter: Payer: Self-pay | Admitting: *Deleted

## 2014-11-10 ENCOUNTER — Observation Stay (HOSPITAL_COMMUNITY): Payer: Medicare Other | Admitting: Anesthesiology

## 2014-11-10 DIAGNOSIS — K648 Other hemorrhoids: Secondary | ICD-10-CM | POA: Diagnosis not present

## 2014-11-10 DIAGNOSIS — N183 Chronic kidney disease, stage 3 (moderate): Secondary | ICD-10-CM

## 2014-11-10 DIAGNOSIS — Z7901 Long term (current) use of anticoagulants: Secondary | ICD-10-CM | POA: Diagnosis not present

## 2014-11-10 DIAGNOSIS — D123 Benign neoplasm of transverse colon: Secondary | ICD-10-CM | POA: Diagnosis not present

## 2014-11-10 DIAGNOSIS — K921 Melena: Secondary | ICD-10-CM | POA: Diagnosis not present

## 2014-11-10 DIAGNOSIS — K625 Hemorrhage of anus and rectum: Secondary | ICD-10-CM | POA: Diagnosis not present

## 2014-11-10 DIAGNOSIS — I82411 Acute embolism and thrombosis of right femoral vein: Secondary | ICD-10-CM | POA: Diagnosis not present

## 2014-11-10 DIAGNOSIS — K219 Gastro-esophageal reflux disease without esophagitis: Secondary | ICD-10-CM | POA: Diagnosis not present

## 2014-11-10 HISTORY — PX: COLONOSCOPY: SHX5424

## 2014-11-10 LAB — HEPARIN LEVEL (UNFRACTIONATED): HEPARIN UNFRACTIONATED: 0.81 [IU]/mL — AB (ref 0.30–0.70)

## 2014-11-10 LAB — APTT
aPTT: 92 seconds — ABNORMAL HIGH (ref 24–37)
aPTT: 37 seconds (ref 24–37)

## 2014-11-10 SURGERY — COLONOSCOPY
Anesthesia: Monitor Anesthesia Care

## 2014-11-10 MED ORDER — DOCUSATE SODIUM 100 MG PO CAPS: 100.0000 mg | ORAL_CAPSULE | Freq: Two times a day (BID) | ORAL | Status: DC

## 2014-11-10 MED ORDER — FENTANYL CITRATE (PF) 100 MCG/2ML IJ SOLN
INTRAMUSCULAR | Status: DC | PRN
  Administered 2014-11-10 (×2): 50 ug via INTRAVENOUS

## 2014-11-10 MED ORDER — MIDAZOLAM HCL 5 MG/5ML IJ SOLN
INTRAMUSCULAR | Status: DC | PRN
  Administered 2014-11-10: 2 mg via INTRAVENOUS

## 2014-11-10 MED ORDER — POLYETHYLENE GLYCOL 3350 17 G PO PACK: 17.0000 g | PACK | Freq: Every day | ORAL | Status: DC

## 2014-11-10 MED ORDER — LIDOCAINE HCL (CARDIAC) 20 MG/ML IV SOLN
INTRAVENOUS | Status: DC | PRN
  Administered 2014-11-10 (×2): 50 mg via INTRAVENOUS

## 2014-11-10 MED ORDER — PROPOFOL 10 MG/ML IV BOLUS
INTRAVENOUS | Status: DC | PRN
  Administered 2014-11-10: 10 mg via INTRAVENOUS
  Administered 2014-11-10 (×2): 20 mg via INTRAVENOUS
  Administered 2014-11-10: 10 mg via INTRAVENOUS
  Administered 2014-11-10 (×2): 20 mg via INTRAVENOUS

## 2014-11-10 MED ORDER — PROMETHAZINE HCL 25 MG/ML IJ SOLN
6.2500 mg | INTRAMUSCULAR | Status: DC | PRN
Start: 2014-11-10 — End: 2014-11-10

## 2014-11-10 MED ORDER — LACTATED RINGERS IV SOLN
INTRAVENOUS | Status: DC | PRN
  Administered 2014-11-10: 11:00:00 via INTRAVENOUS

## 2014-11-10 MED ORDER — RIVAROXABAN 20 MG PO TABS
20.0000 mg | ORAL_TABLET | Freq: Every day | ORAL | Status: DC
  Administered 2014-11-10: 20 mg via ORAL
  Filled 2014-11-10: qty 1

## 2014-11-10 MED ORDER — PROPOFOL INFUSION 10 MG/ML OPTIME
INTRAVENOUS | Status: DC | PRN
  Administered 2014-11-10: 50 ug/kg/min via INTRAVENOUS

## 2014-11-10 NOTE — Care Management Note (Signed)
Case Management Note  Patient Details  Name: Glenn Hawkins MRN: [id number] Date of Birth: [birth year]  Subjective/Objective: Patient for dc home, no needs.                   Action/Plan:   Expected Discharge Date:                  Expected Discharge Plan:  Home/Self Care  In-House Referral:     Discharge planning Services  CM Consult  Post Acute Care Choice:    Choice offered to:     DME Arranged:    DME Agency:     HH Arranged:    [place] Agency:     Status of Service:  Completed, signed off  Medicare Important Message Given:    Date Medicare IM Given:    Medicare IM give by:    Date Additional Medicare IM Given:    Additional Medicare Important Message give by:     If discussed at [place] of Stay Meetings, dates discussed:    Additional Comments:  Zenon Mayo, RN 11/10/2014, 4:21 PM

## 2014-11-10 NOTE — Progress Notes (Signed)
ANTICOAGULATION CONSULT NOTE - Initial Consult  Pharmacy Consult for Heparin  Indication:  Recent RLE DVT (diagnosed 10/17/14)  Allergies  Allergen Reactions  . Nifedipine Diarrhea  . Benazepril Other (See Comments)  . Haloperidol And Related Other (See Comments)  . Omeprazole Other (See Comments)    Patient Measurements: Height: 5' 8"  (172.7 cm) Weight: 214 lb 11.7 oz (97.4 kg) IBW/kg (Calculated) : 68.4 Heparin Dosing Weight: 89 kg  Vital Signs: Temp: 98.3 F (36.8 C) (09/15 2151) Temp Source: Oral (09/15 2151) BP: 137/81 mmHg (09/15 2151) Pulse Rate: 63 (09/15 2151)  Labs:  Recent Labs  11/08/14 1446  11/09/14 1040 11/09/14 1450 11/09/14 1810 11/09/14 2116 11/10/14 0123  HGB 15.0  < >  --  13.1 13.7 13.1  --   HCT 47.2  < >  --  41.6 43.0 41.3  --   PLT 160  < >  --  148* 148* 143*  --   APTT  --   --   --  32  --   --  92*  LABPROT 25.8*  --   --   --   --   --   --   INR 2.40*  --   --   --   --   --   --   HEPARINUNFRC  --   --   --  0.76*  --   --  0.81*  CREATININE 2.14*  --  2.10*  --   --   --   --   < > = values in this interval not displayed.  Estimated Creatinine Clearance: 43.4 mL/min (by C-G formula based on Cr of 2.1).   Medical History: Past Medical History  Diagnosis Date  . Acute myocardial infarction, subendocardial infarction, initial episode of care   . Other primary cardiomyopathies   . Esophageal reflux   . Depressive disorder, not elsewhere classified   . Gout, unspecified   . Unspecified schizophrenia, unspecified condition   . Encounter for long-term (current) use of other medications   . Personal history of tobacco use, presenting hazards to health   . Hypotension, unspecified   . Mitral valve disorders     moderate MR  . Coronary atherosclerosis of native coronary artery   . Hypertension   . Other and unspecified hyperlipidemia   . Torn ligament   . Ischemic cardiomyopathy   . Hx of echocardiogram 2014    EF 40%  . CKD  (chronic kidney disease) stage 3, GFR 30-59 ml/min     baseline Cr around 2.5  . 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 CHF (congestive heart failure)     Medications:  Prescriptions prior to admission  Medication Sig Dispense Refill Last Dose  . amLODipine (NORVASC) 10 MG tablet Take 10 mg by mouth daily.   11/08/2014 at Unknown time  . ARIPiprazole (ABILIFY) 20 MG tablet Take 20 mg by mouth 2 (two) times daily.   11/08/2014 at Unknown time  . buPROPion (WELLBUTRIN) 100 MG tablet Take 200 mg by mouth 2 (two) times daily.    11/08/2014 at Unknown time  . Cholecalciferol 1000 UNITS capsule Take 2,000 Units by mouth daily.   11/08/2014 at Unknown time  . hydrALAZINE (APRESOLINE) 100 MG tablet Take 100 mg by mouth 2 (two) times daily.   11/08/2014 at Unknown time  . HYDROcodone-acetaminophen (NORCO/VICODIN) 5-325 MG per tablet Take 1 tablet by  mouth every 6 (six) hours as needed for severe pain.    Past Month at Unknown time  . isosorbide mononitrate (IMDUR) 60 MG 24 hr tablet Take 1 tablet (60 mg total) by mouth every morning. 90 tablet 4 11/08/2014 at Unknown time  . latanoprost (XALATAN) 0.005 % ophthalmic solution Place 1 drop into both eyes at bedtime.    11/07/2014 at Unknown time  . LORazepam (ATIVAN) 1 MG tablet Take 2 mg by mouth at bedtime.    11/07/2014 at Unknown time  . metoprolol (LOPRESSOR) 100 MG tablet Take 200 mg by mouth 2 (two) times daily.    11/08/2014 at 1000  . nitroGLYCERIN (NITROSTAT) 0.4 MG SL tablet Place 1 tablet (0.4 mg total) under the tongue every 5 (five) minutes as needed for chest pain. 25 tablet 3 not used  . pantoprazole (PROTONIX) 40 MG tablet Take 40 mg by mouth daily.   11/08/2014 at Unknown time  . QUEtiapine (SEROQUEL) 400 MG tablet Take 400 mg by mouth at bedtime.   11/07/2014 at Unknown time  . ranitidine (ZANTAC) 150 MG tablet Take 150 mg by mouth 2 (two) times daily as  needed for heartburn.    Past Month at Unknown time  . rivaroxaban (XARELTO) 20 MG TABS tablet Take 20 mg by mouth daily with supper.    11/07/2014 at Unknown time  . sertraline (ZOLOFT) 100 MG tablet Take 150 mg by mouth daily.    11/08/2014 at Unknown time  . sildenafil (VIAGRA) 50 MG tablet Take 50 mg by mouth daily as needed for erectile dysfunction.   Past Month at Unknown time  . simvastatin (ZOCOR) 40 MG tablet Take 1 tablet (40 mg total) by mouth at bedtime. (Patient taking differently: Take 20 mg by mouth at bedtime. ) 90 tablet 3 11/07/2014 at Unknown time  . spironolactone (ALDACTONE) 25 MG tablet Take 50 mg by mouth at bedtime.    11/07/2014 at Unknown time   Scheduled:  . ARIPiprazole  20 mg Oral BID  . buPROPion  200 mg Oral BID  . hydrALAZINE  50 mg Oral 3 times per day  . isosorbide mononitrate  60 mg Oral q morning - 10a  . latanoprost  1 drop Both Eyes QHS  . metoprolol  200 mg Oral BID  . pantoprazole  40 mg Oral BID  . peg 3350 powder  0.5 kit Oral Once  . QUEtiapine  400 mg Oral QHS  . sertraline  150 mg Oral Daily  . simvastatin  20 mg Oral QHS  . spironolactone  50 mg Oral QHS    Assessment: 59 y.o male admitted on 11/08/14 with rectal bleeding on Xarelto. Multiple medical problems as listed in [place] He is on Xarelto for recently diagnosed RLE DVT  (diagnosed 10/17/14). PTA he was taking Xarelto 48m daily with supper,  with last dose taken on 11/07/14.  Xarelto held on admit due to rectal bleeding.   Hgb on admit = 15, today Hgb =14.6, Hct 46.4, pltc 148K GI consulted, awaiting CTscan. Possible colonoscopy tomorrow.   It has been >24 hours since last Xarelto dose, thus okay to start IV heparin drip now.   Initial aPTT is therapeutic at 92 seconds on heparin 1400 units/hr. No issues with infusion or bleeding.   Goal of Therapy:  aPTT 66-102 seconds Heparin level 0.3-0.7 units/ml Monitor platelets by anticoagulation protocol: Yes   Plan:  Continue heparin drip 1400  units/hr 6 hour confirmatory aPTT/HL Daily aPTT/HL   CAndrey Cota  Diona Foley, PharmD Clinical Pharmacist Pager [phone] 11/10/2014,2:55 AM

## 2014-11-10 NOTE — Transfer of Care (Signed)
Immediate Anesthesia Transfer of Care Note  Patient: Glenn Hawkins   Procedure(s) Performed: Procedure(s): COLONOSCOPY (N/A)  Patient Location: PACU and Endoscopy Unit  Anesthesia Type:MAC  Level of Consciousness: awake, alert , oriented and sedated  Airway & Oxygen Therapy: Patient Spontanous Breathing and Patient connected to nasal cannula oxygen  Post-op Assessment: Report given to RN, Post -op Vital signs reviewed and stable and Patient moving all extremities  Post vital signs: Reviewed and stable  Last Vitals:  Filed Vitals:   11/10/14 1140  BP: 109/64  Pulse: 66  Temp:   Resp: 18    Complications: No apparent anesthesia complications

## 2014-11-10 NOTE — Telephone Encounter (Signed)
-----   Message from Manus Gunning, MD sent at 11/10/2014 11:39 AM EDT ----- Carola Frost,  This patient will need follow up in 2 months or so to discuss repeat colonoscopy. He just started Xarelto and had some rectal bleeding but polyps noted on colonoscopy that I didn't remove. He will need another exam but should see him in clinic first. Thanks

## 2014-11-10 NOTE — Interval H&P Note (Signed)
History and Physical Interval Note:  11/10/2014 10:11 AM  Glenn Hawkins  has presented today for surgery, with the diagnosis of rectal bleeding  The various methods of treatment have been discussed with the patient and family. After consideration of risks, benefits and other options for treatment, the patient has consented to  Procedure(s): COLONOSCOPY (N/A) as a surgical intervention .  The patient's history has been reviewed, patient examined, no change in status, stable for surgery.  I have reviewed the patient's chart and labs.  Questions were answered to the patient's satisfaction.     Glenn Hawkins

## 2014-11-10 NOTE — Progress Notes (Signed)
Pt is slowly drinking the bowel prep, as he stated that he wanted to wait until this time to start it. He c/o burning in his esophagus region and after much deliberation decided to take the p.o Zofran, as he refused the IV Zofran, because he was used to the pills. Will continue to monitor.

## 2014-11-10 NOTE — Telephone Encounter (Signed)
Scheduled on 01/15/15 at 11:30 AM. Letter mailed to patient.

## 2014-11-10 NOTE — Progress Notes (Signed)
Pt finishing up the last of the ordered bowel prep. He stated he had one loose bowel movement but didn't save. I instructed him to save in order to see if his stool is clear.He verbalized understanding of instructions.

## 2014-11-10 NOTE — Progress Notes (Signed)
ANTICOAGULATION CONSULT NOTE - Initial Consult  Pharmacy Consult : resume Xarelto Indication:  Recent RLE DVT (diagnosed 10/17/14)  Allergies  Allergen Reactions  . Nifedipine Diarrhea  . Benazepril Other (See Comments)  . Haloperidol And Related Other (See Comments)  . Omeprazole Other (See Comments)    Patient Measurements: Height: 5' 8"  (172.7 cm) Weight: 214 lb 1.1 oz (97.1 kg) IBW/kg (Calculated) : 68.4 Heparin Dosing Weight: 89 kg  Vital Signs: Temp: 98.2 F (36.8 C) (09/16 1134) Temp Source: Oral (09/16 1134) BP: 109/64 mmHg (09/16 1140) Pulse Rate: 66 (09/16 1140)  Labs:  Recent Labs  11/08/14 1446  11/09/14 1040 11/09/14 1450 11/09/14 1810 11/09/14 2116 11/10/14 0123 11/10/14 0815  HGB 15.0  < >  --  13.1 13.7 13.1  --   --   HCT 47.2  < >  --  41.6 43.0 41.3  --   --   PLT 160  < >  --  148* 148* 143*  --   --   APTT  --   --   --  32  --   --  92* 37  LABPROT 25.8*  --   --   --   --   --   --   --   INR 2.40*  --   --   --   --   --   --   --   HEPARINUNFRC  --   --   --  0.76*  --   --  0.81*  --   CREATININE 2.14*  --  2.10*  --   --   --   --   --   < > = values in this interval not displayed.  Estimated Creatinine Clearance: 43.3 mL/min (by C-G formula based on Cr of 2.1).   Medical History: Past Medical History  Diagnosis Date  . Acute myocardial infarction, subendocardial infarction, initial episode of care   . Other primary cardiomyopathies   . Esophageal reflux   . Depressive disorder, not elsewhere classified   . Gout, unspecified   . Unspecified schizophrenia, unspecified condition   . Encounter for long-term (current) use of other medications   . Personal history of tobacco use, presenting hazards to health   . Hypotension, unspecified   . Mitral valve disorders     moderate MR  . Coronary atherosclerosis of native coronary artery   . Hypertension   . Other and unspecified hyperlipidemia   . Torn ligament   . Ischemic  cardiomyopathy   . Hx of echocardiogram 2014    EF 40%  . CKD (chronic kidney disease) stage 3, GFR 30-59 ml/min     baseline Cr around 2.5  . 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 CHF (congestive heart failure)     Medications:  Prescriptions prior to admission  Medication Sig Dispense Refill Last Dose  . amLODipine (NORVASC) 10 MG tablet Take 10 mg by mouth daily.   11/08/2014 at Unknown time  . ARIPiprazole (ABILIFY) 20 MG tablet Take 20 mg by mouth 2 (two) times daily.   11/08/2014 at Unknown time  . buPROPion (WELLBUTRIN) 100 MG tablet Take 200 mg by mouth 2 (two) times daily.    11/08/2014 at Unknown time  . Cholecalciferol 1000 UNITS capsule Take 2,000 Units by mouth daily.   11/08/2014 at Unknown time  . hydrALAZINE (APRESOLINE) 100 MG tablet Take 100 mg  by mouth 2 (two) times daily.   11/08/2014 at Unknown time  . HYDROcodone-acetaminophen (NORCO/VICODIN) 5-325 MG per tablet Take 1 tablet by mouth every 6 (six) hours as needed for severe pain.    Past Month at Unknown time  . isosorbide mononitrate (IMDUR) 60 MG 24 hr tablet Take 1 tablet (60 mg total) by mouth every morning. 90 tablet 4 11/08/2014 at Unknown time  . latanoprost (XALATAN) 0.005 % ophthalmic solution Place 1 drop into both eyes at bedtime.    11/07/2014 at Unknown time  . LORazepam (ATIVAN) 1 MG tablet Take 2 mg by mouth at bedtime.    11/07/2014 at Unknown time  . metoprolol (LOPRESSOR) 100 MG tablet Take 200 mg by mouth 2 (two) times daily.    11/08/2014 at 1000  . nitroGLYCERIN (NITROSTAT) 0.4 MG SL tablet Place 1 tablet (0.4 mg total) under the tongue every 5 (five) minutes as needed for chest pain. 25 tablet 3 not used  . pantoprazole (PROTONIX) 40 MG tablet Take 40 mg by mouth daily.   11/08/2014 at Unknown time  . QUEtiapine (SEROQUEL) 400 MG tablet Take 400 mg by mouth at bedtime.   11/07/2014 at Unknown time  . ranitidine  (ZANTAC) 150 MG tablet Take 150 mg by mouth 2 (two) times daily as needed for heartburn.    Past Month at Unknown time  . rivaroxaban (XARELTO) 20 MG TABS tablet Take 20 mg by mouth daily with supper.    11/07/2014 at Unknown time  . sertraline (ZOLOFT) 100 MG tablet Take 150 mg by mouth daily.    11/08/2014 at Unknown time  . sildenafil (VIAGRA) 50 MG tablet Take 50 mg by mouth daily as needed for erectile dysfunction.   Past Month at Unknown time  . simvastatin (ZOCOR) 40 MG tablet Take 1 tablet (40 mg total) by mouth at bedtime. (Patient taking differently: Take 20 mg by mouth at bedtime. ) 90 tablet 3 11/07/2014 at Unknown time  . spironolactone (ALDACTONE) 25 MG tablet Take 50 mg by mouth at bedtime.    11/07/2014 at Unknown time   Scheduled:  . ARIPiprazole  20 mg Oral BID  . buPROPion  200 mg Oral BID  . hydrALAZINE  50 mg Oral 3 times per day  . isosorbide mononitrate  60 mg Oral q morning - 10a  . latanoprost  1 drop Both Eyes QHS  . metoprolol  200 mg Oral BID  . pantoprazole  40 mg Oral BID  . peg 3350 powder  0.5 kit Oral Once  . QUEtiapine  400 mg Oral QHS  . sertraline  150 mg Oral Daily  . simvastatin  20 mg Oral QHS  . spironolactone  50 mg Oral QHS    Assessment: 59 y.o male admitted on 11/08/14 with rectal bleeding on Xarelto. Multiple medical problems as listed in [place] He is on Xarelto for recently diagnosed RLE DVT  (diagnosed 10/17/14). PTA he was taking Xarelto 23m daily with supper,  with last dose taken on 11/07/14.  Xarelto held on admit due to rectal bleeding.  Bridged with IV heparin infusion started last night and discontinues this AM prior to scheduled colonoscopy.  S/p colonoscopy, GI MD recommended to continue Xarelto and noted no evidence of active bleeding, no signigicant decrease in Hgb. I discussed this with Dr. CWyline Copaswho gave order to restart Xarelto today.   Hgb =13.1, Hct 41.3, pltc 143K  Goal of Therapy:  Monitor platelets by anticoagulation protocol:  Yes   Plan:  Restart Xarelto 44m daily with supper   Thank you for allowing pharmacy to be part of this patients care team. CArman Bogus9/16/2016,1:37 PM

## 2014-11-10 NOTE — Progress Notes (Signed)
Pt refusing bowel prep. He states also he wants the heparin gtt, to be turned off. His abd. Is distended and he is passing gas, but thinks it is the heparin gtt.,That is causing it. [facility] aware and in to see patient.

## 2014-11-10 NOTE — Progress Notes (Signed)
Reviewed discharge paperwork with pt.  PIV removed.  Pt escorted to discharge location with RN.

## 2014-11-10 NOTE — H&P (View-Only) (Signed)
Consultation  Referring Provider:   [facility]  Primary Care Physician:  Glenda Chroman., MD Primary Gastroenterologist:  unassigned       Reason for Consultation: rectal bleeding.             HPI:   Glenn Hawkins is a 59 y.o. male with multiple medical problems as listed in [place]. He presented to ED yesterday with rectal bleeding. Patient on Xarelto for RLE DVT diagnosed 2 weeks ago. Hgb 14.6 in ED, remains same this am.    Patient describes diffuse abdominal discomfort associated with the bleeding. He also describes loose stools over the last few weeks but upon further questioning this doesn't sound unusual for him. No recent antibiotics. Patient has no rectal pain. He gives a history of hemorrhoids. No [place] of colon cancer. Patient states he has had 2-3 colonoscopies for "acid reflux. Polyp may have been removed at time of last colonoscopy but patient not sure.   GERD - takes Zantac and PPI on as needed basis.   Past Medical History  Diagnosis Date  . Acute myocardial infarction, subendocardial infarction, initial episode of care   . Other primary cardiomyopathies   . Esophageal reflux   . Depressive disorder, not elsewhere classified   . Gout, unspecified   . Unspecified schizophrenia, unspecified condition   . Encounter for long-term (current) use of other medications   . Personal history of tobacco use, presenting hazards to health   . Hypotension, unspecified   . Mitral valve disorders     moderate MR  . Coronary atherosclerosis of native coronary artery   . Hypertension   . Other and unspecified hyperlipidemia   . Torn ligament   . Ischemic cardiomyopathy   . Hx of echocardiogram 2014    EF 40%  . CKD (chronic kidney disease) stage 3, GFR 30-59 ml/min     baseline Cr around 2.5  . 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 CHF (congestive heart failure)     Past  Surgical History  Procedure Laterality Date  . None    . Colonoscopy    . Eye surgery       [place]: No colon cancer   Social History  Substance Use Topics  . Smoking status: Former Smoker -- 1.00 packs/day for 23 years    Types: Cigarettes    Quit date: 02/25/1996  . Smokeless tobacco: Never Used     Comment: + 15 years of smoking  . Alcohol Use: No    Prior to Admission medications   Medication Sig Start Date End Date Taking? Authorizing Provider  amLODipine (NORVASC) 10 MG tablet Take 10 mg by mouth daily.   Yes Historical Provider, MD  ARIPiprazole (ABILIFY) 20 MG tablet Take 20 mg by mouth 2 (two) times daily.   Yes Historical Provider, MD  buPROPion (WELLBUTRIN) 100 MG tablet Take 200 mg by mouth 2 (two) times daily.    Yes Historical Provider, MD  Cholecalciferol 1000 UNITS capsule Take 2,000 Units by mouth daily.   Yes Historical Provider, MD  hydrALAZINE (APRESOLINE) 100 MG tablet Take 100 mg by mouth 2 (two) times daily.   Yes Historical Provider, MD  HYDROcodone-acetaminophen (NORCO/VICODIN) 5-325 MG per tablet Take 1 tablet by mouth every 6 (six) hours as needed for severe pain.  07/21/14  Yes Historical Provider, MD  isosorbide mononitrate (IMDUR) 60 MG 24 hr tablet Take  1 tablet (60 mg total) by mouth every morning. 08/25/14  Yes Wellington Hampshire, MD  latanoprost (XALATAN) 0.005 % ophthalmic solution Place 1 drop into both eyes at bedtime.    Yes Historical Provider, MD  LORazepam (ATIVAN) 1 MG tablet Take 2 mg by mouth at bedtime.    Yes Historical Provider, MD  metoprolol (LOPRESSOR) 100 MG tablet Take 200 mg by mouth 2 (two) times daily.    Yes Historical Provider, MD  nitroGLYCERIN (NITROSTAT) 0.4 MG SL tablet Place 1 tablet (0.4 mg total) under the tongue every 5 (five) minutes as needed for chest pain. 03/21/11 11/08/14 Yes Wellington Hampshire, MD  pantoprazole (PROTONIX) 40 MG tablet Take 40 mg by mouth daily.   Yes Historical Provider, MD  QUEtiapine (SEROQUEL) 400 MG  tablet Take 400 mg by mouth at bedtime.   Yes Historical Provider, MD  ranitidine (ZANTAC) 150 MG tablet Take 150 mg by mouth 2 (two) times daily as needed for heartburn.    Yes Historical Provider, MD  rivaroxaban (XARELTO) 20 MG TABS tablet Take 20 mg by mouth daily with supper.    Yes Historical Provider, MD  sertraline (ZOLOFT) 100 MG tablet Take 150 mg by mouth daily.    Yes Historical Provider, MD  sildenafil (VIAGRA) 50 MG tablet Take 50 mg by mouth daily as needed for erectile dysfunction.   Yes Historical Provider, MD  simvastatin (ZOCOR) 40 MG tablet Take 1 tablet (40 mg total) by mouth at bedtime. Patient taking differently: Take 20 mg by mouth at bedtime.  07/04/13  Yes Wellington Hampshire, MD  spironolactone (ALDACTONE) 25 MG tablet Take 50 mg by mouth at bedtime.    Yes Historical Provider, MD    Current Facility-Administered Medications  Medication Dose Route Frequency Provider Last Rate Last Dose  . 0.9 %  sodium chloride infusion   Intravenous Continuous Barton Dubois, MD 50 mL/hr at 11/08/14 2032    . acetaminophen (TYLENOL) tablet 650 mg  650 mg Oral Q6H PRN Barton Dubois, MD       Or  . acetaminophen (TYLENOL) suppository 650 mg  650 mg Rectal Q6H PRN Barton Dubois, MD      . ARIPiprazole (ABILIFY) tablet 20 mg  20 mg Oral BID Barton Dubois, MD   20 mg at 11/08/14 2126  . buPROPion [facility]) tablet 200 mg  200 mg Oral BID Barton Dubois, MD   200 mg at 11/08/14 2125  . hydrALAZINE (APRESOLINE) tablet 50 mg  50 mg Oral 3 times per day Barton Dubois, MD   50 mg at 11/09/14 0558  . hyoscyamine (LEVSIN SL) SL tablet 0.25 mg  0.25 mg Sublingual Q4H PRN Rhetta Mura Schorr, NP   0.25 mg at 11/09/14 0030  . isosorbide mononitrate (IMDUR) 24 hr tablet 60 mg  60 mg Oral q morning - 10a Barton Dubois, MD      . latanoprost (XALATAN) 0.005 % ophthalmic solution 1 drop  1 drop Both Eyes QHS Barton Dubois, MD   1 drop at 11/08/14 2126  . LORazepam (ATIVAN) tablet 1 mg  1 mg Oral Q12H PRN  Barton Dubois, MD      . magnesium sulfate IVPB 2 g 50 mL  2 g Intravenous Once Donne Hazel, MD      . metoprolol (LOPRESSOR) tablet 200 mg  200 mg Oral BID Barton Dubois, MD   200 mg at 11/08/14 2126  . ondansetron (ZOFRAN) tablet 4 mg  4 mg Oral Q6H PRN Clifton James  Dyann Kief, MD       Or  . ondansetron [facility]) injection 4 mg  4 mg Intravenous Q6H PRN Barton Dubois, MD      . pantoprazole (PROTONIX) EC tablet 40 mg  40 mg Oral BID Barton Dubois, MD   40 mg at 11/08/14 2126  . QUEtiapine (SEROQUEL) tablet 400 mg  400 mg Oral QHS Barton Dubois, MD   400 mg at 11/08/14 2125  . sertraline (ZOLOFT) tablet 150 mg  150 mg Oral Daily Barton Dubois, MD      . simvastatin (ZOCOR) tablet 20 mg  20 mg Oral QHS Barton Dubois, MD   20 mg at 11/08/14 2127  . spironolactone (ALDACTONE) tablet 50 mg  50 mg Oral QHS Barton Dubois, MD   50 mg at 11/08/14 2125    Allergies as of 11/08/2014 - Review Complete 11/08/2014  Allergen Reaction Noted  . Nifedipine Diarrhea 12/08/2013  . Benazepril Other (See Comments) 03/07/2011  . Haloperidol and related Other (See Comments) 03/07/2011  . Omeprazole Other (See Comments) 03/07/2011    Review of Systems:    As per HPI, otherwise negative   Physical Exam:  Vital signs in last 24 hours: Temp:  [97.9 F (36.6 C)-98.4 F (36.9 C)] 97.9 F (36.6 C) (09/15 0555) Pulse Rate:  [63-93] 63 (09/15 0555) Resp:  [13-25] 18 (09/15 0555) BP: (132-167)/(78-93) 132/78 mmHg (09/15 0555) SpO2:  [96 %-99 %] 98 % (09/15 0555) Weight:  [214 lb 11.7 oz (97.4 kg)-220 lb (99.791 kg)] 214 lb 11.7 oz (97.4 kg) (09/15 0555) Last BM Date: 11/08/14 General:   Pleasant black male, well developed Head:  Normocephalic and atraumatic. Eyes:   No icterus.   Conjunctiva pink. Ears:  Normal auditory acuity. Neck:  Supple Lungs:  Respirations even and unlabored. Lungs clear to auscultation bilaterally.   No wheezes, crackles, or rhonchi.  Heart:  Regular rate and rhythm Abdomen:  Soft,  nondistended, nontender. Normal bowel sounds. No appreciable masses or hepatomegaly.  Rectal: No external lesions. No stool or blood in vault. Blood on a piece of tissue patient had tucked between buttocks.  Rectal:  Not performed.  Msk:  Symmetrical without gross deformities.  Extremities:  Without edema. Neurologic:  Alert and  oriented x4;  grossly normal neurologically. Skin:  Intact without significant lesions or rashes. Psych:  Alert and cooperative. Normal affect.  LAB RESULTS:  Recent Labs  11/08/14 1446 11/08/14 2047 11/09/14 0745  WBC 6.5 6.7 5.8  HGB 15.0 14.6 14.6  HCT 47.2 45.3 46.4  PLT 160 140* 148*   BMET  Recent Labs  11/08/14 1446  NA 139  K 4.1  CL 108  CO2 22  GLUCOSE 116*  BUN 22*  CREATININE 2.14*  CALCIUM 10.6*   LFT  Recent Labs  11/08/14 2047  PROT 6.4*  ALBUMIN 3.8  AST 22  ALT 18  ALKPHOS 81  BILITOT 0.6  BILIDIR <0.1*  IBILI NOT CALCULATED   PT/INR  Recent Labs  11/08/14 1446  LABPROT 25.8*  INR 2.40*    PREVIOUS ENDOSCOPIES:            Colonoscopy approximately 3 years ago in [place] per patient   Impression / Plan:   62. 59 year old male with rectal bleeding on Xarelto.  No rectal pain but diffuse abdominal discomfort. Difficult to sort out, patient doesn't elaborate. Some loose stools but patient describes as more of a chronic issue. Abdominal exam not concerning. Bleeding may be hemorrhoidal in origin, especially  since hgb hasn't dropped. No obvious fissures. Await CTscan, if negative colonoscopy may be warranted, especially given need for anti-platelet therapy. Patient is agreeable to a colonoscopy. Will place on clear liquids now in preparation for colonoscopy tomorrow.   2. DVT of RLE diagnosed late August. On Xarelto  3. Thrombocytopenia. Count stable at 148.   4.  Chronic systolic heart failure  5. CKD, creatinine 2.1 - about baseline.   Thanks   LOS: 1 day   Tye Savoy  11/09/2014, 9:37  AM    Attending Addendum: I have taken an interval history, reviewed the chart, and examined the patient. I agree with the Advanced Practitioner's note and impression. In short, 59 y/o male with multiple medical problems, recently started Xarelto for DVT and has experienced rectal bleeding. No significant drop in Hgb. Suspect this is hemorrhoidal bleeding, as he has had hemorrhoidal bleeding like this previously, however offered him colonoscopy to clarify the etiology given his need for anticoagulation moving forward. The indications, risks, and benefits of colonoscopy were explained to the patient in detail. Risks include but not limited to bleeding, perforation, adverse reaction to medications, cardiopulmonary compromise. Further recommendations pending results of the exam.   [place] Cellar, MD [facility] Pager [phone]

## 2014-11-10 NOTE — Op Note (Signed)
[facility] [place], [id number]   COLONOSCOPY PROCEDURE REPORT  PATIENT: Glenn Hawkins, Glenn Hawkins  MR#: [id number] BIRTHDATE: [birth year] , 37  yrs. old GENDER: male ENDOSCOPIST: [place] Cellar, MD REFERRED BY: PROCEDURE DATE:  11/10/2014 PROCEDURE:   Colonoscopy, diagnostic  ASA CLASS:   Class II INDICATIONS:59 y/o male with hematochezia following initiation of Xarelto for DVT. MEDICATIONS: Per Anesthesia  DESCRIPTION OF PROCEDURE:   After the risks benefits and alternatives of the procedure were thoroughly explained, informed consent was obtained.  The digital rectal exam revealed external hemorrhoids.   The Pentax Adult Colon [id number]  endoscope was introduced through the anus and advanced to the ileum. No adverse events experienced.   The quality of the prep was fair.  The instrument was then slowly withdrawn as the colon was fully examined. Estimated blood loss is zero unless otherwise noted in this procedure report.      COLON FINDINGS: A roughly 76mm polyp was noted in the transverse colon.  It was not removed given the patient's current Xarelto use. A small 53mm polyp was noted in the descending colon and likewise not removed given he will warrant a future colonoscopy for polypectomy in the upcoming months once able to hold Xarelto.  The remainder of the colon was normal however the prep was only fair in the right colon.  The ileum was intubated and normal.  Large internal hemorrhoids were noted, one of which was ulcerated and is the likely cause of the patient's rectal bleeding.  Retroflexed views revealed internal hemorrhoids. The time to cecum = 2.9 Withdrawal time = 14.3   The scope was withdrawn and the procedure completed. COMPLICATIONS: There were no immediate complications.    ENDOSCOPIC IMPRESSION: A roughly 77mm polyp was noted in the transverse colon.  It was not removed given the patient's current Xarelto use. A  small 60mm polyp was noted in the descending colon and likewise not removed given he will warrant a future colonoscopy for polypectomy in the upcoming months once able to hold Xarelto. The remainder of the colon was normal however the prep was only fair in the right colon. The ileum was intubated and normal. Large internal hemorrhoids were noted, one of which was ulcerated and is the likely cause of the patient's rectal bleeding  RECOMMENDATIONS: Daily fiber supplement for hemorrhoids, miralax as needed to prevent constipation Continue Xarelto, no evidence of active bleeding, no significant decrease in Hgb Monitor for recurrent bleedingon Xarelto. Consider banding or surgical therapy for hemorrhoids should hemorrhoidal bleeding persist given the patient's anticoagulation needs. Follow up with our clinic as an outpatient for repeat colonoscopy with polypectomy. If there is a definitive end date for his anticoagulation in the next 6-12 months, would prefer to perform his exam when off therapy. If he warrants indefinite anticoagulation, he will see [place] in the office in the next few months to determine when we can safely hold it for polypectomy (of large polyp).   eSigned:  [place] Cellar, MD 11/10/2014 11:37 AM   cc:   PATIENT NAME:  Glenn Hawkins, Glenn Hawkins MR#: [id number]

## 2014-11-10 NOTE — Anesthesia Postprocedure Evaluation (Signed)
  Anesthesia Post-op Note  Patient: Glenn Hawkins  Procedure(s) Performed: Procedure(s) (LRB): COLONOSCOPY (N/A)  Patient Location: PACU  Anesthesia Type: MAC  Level of Consciousness: awake and alert   Airway and Oxygen Therapy: Patient Spontanous Breathing  Post-op Pain: mild  Post-op Assessment: Post-op Vital signs reviewed, Patient's Cardiovascular Status Stable, Respiratory Function Stable, Patent Airway and No signs of Nausea or vomiting  Last Vitals:  Filed Vitals:   11/10/14 1140  BP: 109/64  Pulse: 66  Temp:   Resp: 18    Post-op Vital Signs: stable   Complications: No apparent anesthesia complications

## 2014-11-10 NOTE — Anesthesia Preprocedure Evaluation (Addendum)
Anesthesia Evaluation  Patient identified by MRN, date of birth, ID band Patient awake    Reviewed: Allergy & Precautions, NPO status , Patient's Chart, lab work & pertinent test results  Airway Mallampati: II  TM Distance: >3 FB Neck ROM: Full    Dental no notable dental hx.    Pulmonary neg pulmonary ROS, former smoker,    Pulmonary exam normal breath sounds clear to auscultation       Cardiovascular hypertension, + CAD, + Past MI and +CHF  + Valvular Problems/Murmurs MR  Rhythm:Regular Rate:Normal + Systolic murmurs  Hx of echocardiogram 2014 EF 40%     Neuro/Psych Schizophrenia negative neurological ROS     GI/Hepatic negative GI ROS, Neg liver ROS,   Endo/Other  negative endocrine ROS  Renal/GU Renal InsufficiencyRenal disease  negative genitourinary   Musculoskeletal negative musculoskeletal ROS (+)   Abdominal   Peds negative pediatric ROS (+)  Hematology negative hematology ROS (+)   Anesthesia Other Findings   Reproductive/Obstetrics negative OB ROS                            Anesthesia Physical Anesthesia Plan  ASA: III  Anesthesia Plan: MAC   Post-op Pain Management:    Induction: Intravenous  Airway Management Planned: Nasal Cannula  Additional Equipment:   Intra-op Plan:   Post-operative Plan:   Informed Consent: I have reviewed the patients History and Physical, chart, labs and discussed the procedure including the risks, benefits and alternatives for the proposed anesthesia with the patient or authorized representative who has indicated his/her understanding and acceptance.   Dental advisory given  Plan Discussed with: CRNA and Surgeon  Anesthesia Plan Comments:         Anesthesia Quick Evaluation

## 2014-11-10 NOTE — Discharge Summary (Signed)
Physician Discharge Summary  Glenn Hawkins [id number] DOB: 05-11-1955 DOA: 11/08/2014  PCP: Glenn Chroman., MD  Admit date: 11/08/2014 Discharge date: 11/10/2014  Time spent: 20 minutes  Recommendations for Outpatient Follow-up:  1. Follow up with PCP in 1-2 weeks  Discharge Diagnoses:  Principal Problem:   Rectal bleed Active Problems:   Hypertension   Hyperlipidemia   Chronic systolic heart failure   Acute deep vein thrombosis (DVT) of femoral vein of right lower extremity   CKD (chronic kidney disease) stage 3, GFR 30-59 ml/min   Hyperglycemia   GERD without esophagitis   Obesity   Rectal bleeding   Discharge Condition: stable  Diet recommendation: high fiber  Filed Weights   11/08/14 1942 11/09/14 0555 11/10/14 0700  Weight: 97.4 kg (214 lb 11.7 oz) 97.4 kg (214 lb 11.7 oz) 97.1 kg (214 lb 1.1 oz)    History of present illness:  Please see admit h and p from 9/14 for details. Briefly, pt presented with rectal bleeding in the setting of anticoagulation for recent DVT. The patient was admitted for further work up.  Hospital Course:  1-Rectal bleed secondary to hemorrhoids -patient had reported associated mid abd pain and LLQ -Pt had reported increase straining and some constipation with intermittent diarrhea. -GI was consulted -CT abd with findings of diverticulosis w/o diverticulitis. Also prominent hemorrhoidal artery noted - Colonoscopy with findings of 2 polyps and large internal hemorrhoids with ulceration which are likely the source of bleeding. No active bleed at time of endoscopy - GI recs for follow up colonoscopy for polyp removal in several months. Anticipate patient would need 3-6 months of coumadin -Hgb remained very stable  2- essential Hypertension -remained stable -will monitor and adjust as needed  3-Hyperlipidemia: will continue statins and will check FLP  4-Chronic systolic heart failure: compensated -last EF 40-45% -continued  metoprolol and spironolactone -daily weight -strict intake and output  5-Acute deep vein thrombosis (DVT) of femoral vein of right lower extremity: diagnosed 2 weeks prior to admission -on xarelto prior to admit and held when admitted -Recommended heparin gtt during hospital stay. Risks and benefits to anticoagulation discussed with patient at bedside. Specifically, concerns for extension of clot or even PE. Patient agreed with continuing anticoagulation. Have ordered heparin gtt during hospital stay - Following endoscopy, pt cleared to resume xarelto on discharge   6-CKD (chronic kidney disease) stage 3, GFR 30-59 ml/min: -stable and at baseline -Will monitor trend  7-Hyperglycemia: w/o hx of diabetes - A1C 6.0  8-GERD without esophagitis: will continue PPI, but changed to BID  9-Obesity: Body mass index is 32.47 kg/(m^2). -patient educated about importance of low calorie diet and exercise  10-depression/anxiety: stable -no SI or hallucinations -Continued home psych medications regimen  Procedures:  Colonoscopy 9/16  Consultations:  GI  Discharge Exam: Filed Vitals:   11/10/14 1001 11/10/14 1134 11/10/14 1140 11/10/14 1417  BP: 145/81 101/67 109/64 133/80  Pulse: 70 68 66 75  Temp: 98.5 F (36.9 C) 98.2 F (36.8 C)  98.3 F (36.8 C)  TempSrc: Oral Oral  Oral  Resp: 16 16 18 16   Height:      Weight:      SpO2: 97% 95% 97% 98%    General: awake,in nad Cardiovascular: regular, s1, s2 Respiratory: normal resp effort,no wheezing  Discharge Instructions     Medication List    STOP taking these medications        nitroGLYCERIN 0.4 MG SL tablet  Commonly known as:  NITROSTAT  TAKE these medications        amLODipine 10 MG tablet  Commonly known as:  NORVASC  Take 10 mg by mouth daily.     ARIPiprazole 20 MG tablet  Commonly known as:  ABILIFY  Take 20 mg by mouth 2 (two) times daily.     buPROPion 100 MG tablet  Commonly known as:  WELLBUTRIN   Take 200 mg by mouth 2 (two) times daily.     Cholecalciferol 1000 UNITS capsule  Take 2,000 Units by mouth daily.     docusate sodium 100 MG capsule  Commonly known as:  COLACE  Take 1 capsule (100 mg total) by mouth 2 (two) times daily.     hydrALAZINE 100 MG tablet  Commonly known as:  APRESOLINE  Take 100 mg by mouth 2 (two) times daily.     HYDROcodone-acetaminophen 5-325 MG per tablet  Commonly known as:  NORCO/VICODIN  Take 1 tablet by mouth every 6 (six) hours as needed for severe pain.     isosorbide mononitrate 60 MG 24 hr tablet  Commonly known as:  IMDUR  Take 1 tablet (60 mg total) by mouth every morning.     latanoprost 0.005 % ophthalmic solution  Commonly known as:  XALATAN  Place 1 drop into both eyes at bedtime.     LORazepam 1 MG tablet  Commonly known as:  ATIVAN  Take 2 mg by mouth at bedtime.     metoprolol 100 MG tablet  Commonly known as:  LOPRESSOR  Take 200 mg by mouth 2 (two) times daily.     pantoprazole 40 MG tablet  Commonly known as:  PROTONIX  Take 40 mg by mouth daily.     polyethylene glycol packet  Commonly known as:  MIRALAX  Take 17 g by mouth daily.     QUEtiapine 400 MG tablet  Commonly known as:  SEROQUEL  Take 400 mg by mouth at bedtime.     ranitidine 150 MG tablet  Commonly known as:  ZANTAC  Take 150 mg by mouth 2 (two) times daily as needed for heartburn.     rivaroxaban 20 MG Tabs tablet  Commonly known as:  XARELTO  Take 20 mg by mouth daily with supper.     sertraline 100 MG tablet  Commonly known as:  ZOLOFT  Take 150 mg by mouth daily.     sildenafil 50 MG tablet  Commonly known as:  VIAGRA  Take 50 mg by mouth daily as needed for erectile dysfunction.     simvastatin 40 MG tablet  Commonly known as:  ZOCOR  Take 1 tablet (40 mg total) by mouth at bedtime.     spironolactone 25 MG tablet  Commonly known as:  ALDACTONE  Take 50 mg by mouth at bedtime.       Allergies  Allergen Reactions  .  Nifedipine Diarrhea  . Benazepril Other (See Comments)  . Haloperidol And Related Other (See Comments)  . Omeprazole Other (See Comments)   Follow-up Information    Follow up with Hawkins,Glenn B., MD. Schedule an appointment as soon as possible for a visit in 1 week.   Specialty:  Internal Medicine   Why:  Hospital follow up   Contact information:   [place] [address] [phone]       Follow up with Manus Gunning, MD. Schedule an appointment as soon as possible for a visit in 3 months.   Specialty:  Gastroenterology  Contact information:   [place] [address] [phone]        The results of significant diagnostics from this hospitalization (including imaging, microbiology, ancillary and laboratory) are listed below for reference.    Significant Diagnostic Studies: Ct Abdomen Pelvis Wo Contrast  11/09/2014   CLINICAL DATA:  Pt c/o abd pain, distension, and positive BRBPR. Past medical Hx; CHF; HTN; Acute myocardial infarction, subendocardial infarction, initial episode of care; Other primary cardiomyopathies; Depressive disorder, not elsewhere classified; Unspecified schizophrenia; Encounter for long-term (current) use of other medications; Hypotension, unspecified; Mitral valve disorders; Coronary atherosclerosis of native coronary artery; Ischemic cardiomyopathy; CKD (chronic kidney disease) stage 3  EXAM: CT ABDOMEN AND PELVIS WITHOUT CONTRAST  TECHNIQUE: Multidetector CT imaging of the abdomen and pelvis was performed following the standard protocol without IV contrast.  COMPARISON:  06/30/2012 04/19/2010  FINDINGS: Lung bases: Minimal subsegmental atelectasis. Otherwise clear. Heart normal in size.  Liver, spleen, gallbladder, pancreas, adrenal glands:  Unremarkable.  Kidneys, ureters, bladder: Bilateral renal cortical thinning. Two low-density masses arise from the left kidney, largest at the midpole measuring 4.2 cm. These are  stable. No other renal masses. No convincing intrarenal stones. No hydronephrosis. Ureters normal in course and in caliber. Bladder is unremarkable.  Lymph nodes:  No adenopathy.  Ascites:  None.  Vascular: Prominent superior hemorrhoidal artery, stable from the prior exam. Mild atherosclerotic calcifications along the aorta.  Gastrointestinal: Few scattered colonic diverticula. No diverticulitis. No bowel wall thickening. No inflammatory changes. Small bowel is unremarkable. Stomach is unremarkable. Normal appendix visualized.  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 This is stable. Degenerative changes noted of the lumbar spine most prominently at L4-L5.  IMPRESSION: 1. No acute findings. 2. Several scattered colonic diverticula are noted without evidence of diverticulitis. 3. There is a prominent superior hemorrhoidal artery arising from a prominent inferior mesenteric artery. This was present on prior exams. The etiology and clinical significance is unclear.   Electronically Signed   By: Lajean Manes M.D.   On: 11/09/2014 13:23   Dg Chest 2 View  10/17/2014   CLINICAL DATA:  Shortness of breath.  Weakness  EXAM: CHEST  2 VIEW  COMPARISON:  None.  FINDINGS: The heart size is enlarged. Both lungs are clear. The visualized skeletal structures are unremarkable.  IMPRESSION: No active cardiopulmonary disease.   Electronically Signed   By: Kerby Moors M.D.   On: 10/17/2014 19:30   Nm Pulmonary Perf And Vent  10/17/2014   CLINICAL DATA:  Shortness of breath since 09/1914. DVT right lower extremity. Positive D-dimer.  EXAM: NUCLEAR MEDICINE VENTILATION - PERFUSION LUNG SCAN  TECHNIQUE: Ventilation images were obtained in multiple projections using inhaled aerosol Tc-26m DTPA. Perfusion images were obtained in multiple projections after intravenous injection of Tc-71m MAA.  RADIOPHARMACEUTICALS:  41.6 mCi Technetium-69m DTPA aerosol  inhalation and 6.3 mCi Technetium-71m MAA IV  COMPARISON:  Chest 10/17/2014  FINDINGS: Ventilation: Focal areas of decreased ventilation demonstrated in the left upper lung and right lower lung.  Perfusion: Focal matched areas of decreased profusion demonstrated in the left upper lung and right lower lung. No wedge shaped peripheral perfusion defects to suggest acute pulmonary embolism.  IMPRESSION: Low probability of pulmonary embolus.   Electronically Signed   By: Lucienne Capers M.D.   On: 10/17/2014 22:18    Microbiology: No results found for this or any previous visit (from the past 240 hour(s)).  Labs: Basic Metabolic Panel:  Recent Labs Lab 11/08/14 1446 11/08/14 2047 11/09/14 1040  NA 139  --  140  K 4.1  --  5.1  CL 108  --  109  CO2 22  --  25  GLUCOSE 116*  --  93  BUN 22*  --  17  CREATININE 2.14*  --  2.10*  CALCIUM 10.6*  --  9.5  MG  --  1.6*  --    Liver Function Tests:  Recent Labs Lab 11/08/14 1446 11/08/14 2047  AST 22 22  ALT 19 18  ALKPHOS 86 81  BILITOT 0.6 0.6  PROT 6.7 6.4*  ALBUMIN 4.1 3.8   No results for input(s): LIPASE, AMYLASE in the last 168 hours. No results for input(s): AMMONIA in the last 168 hours. CBC:  Recent Labs Lab 11/08/14 2047 11/09/14 0745 11/09/14 1450 11/09/14 1810 11/09/14 2116  WBC 6.7 5.8 6.2 7.1 6.5  HGB 14.6 14.6 13.1 13.7 13.1  HCT 45.3 46.4 41.6 43.0 41.3  MCV 95.6 96.5 96.1 96.2 95.4  PLT 140* 148* 148* 148* 143*   Cardiac Enzymes: No results for input(s): CKTOTAL, CKMB, CKMBINDEX, TROPONINI in the last 168 hours. BNP: BNP (last 3 results)  Recent Labs  10/17/14 1856  BNP 1052.4*    ProBNP (last 3 results) No results for input(s): PROBNP in the last 8760 hours.  CBG: No results for input(s): GLUCAP in the last 168 hours.   Signed:  CHIU, Orpah Melter  [facility] 11/10/2014, 6:33 PM

## 2014-11-13 ENCOUNTER — Encounter (HOSPITAL_COMMUNITY): Payer: Self-pay | Admitting: Gastroenterology

## 2014-11-16 ENCOUNTER — Telehealth: Payer: Self-pay | Admitting: Gastroenterology

## 2014-11-16 MED ORDER — HYDROCORTISONE ACETATE 25 MG RE SUPP: RECTAL | Status: DC

## 2014-11-16 NOTE — Telephone Encounter (Signed)
Glenn Hawkins can you ask him if he is taking a daily fiber supplement. Otherwise would recommend a trial of Anusol suppository daily to see if this helps, if you could help order it. He is on Xarelto for a recent DVT and to perform banding safely, the Xarelto would need to be held, as following banding there is risk for bleeding. Given his DVT is recent we don't want to hold Xarelto if at all possible. He should follow up with his PCM or whomever is prescribing his anticoagulation to see if there is another regimen he may want to try. Otherwise recommend we maximize his medical therapy prior to banding / surgical intervention to avoid stopping his anticoagulation. Can you let him know?

## 2014-11-16 NOTE — Telephone Encounter (Signed)
Patient given recommendations. Rx sent for Anusol suppositories.

## 2014-11-16 NOTE — Telephone Encounter (Signed)
Spoke with patient and he states he is back on Xarelto. He is having bright, red blood in stool again. He would like to have hemorrhoid banding or surgery. Please, advise.

## 2014-11-24 ENCOUNTER — Telehealth: Payer: Self-pay | Admitting: Gastroenterology

## 2014-11-24 NOTE — Telephone Encounter (Signed)
Spoke with patient and he states he is still having problems with hemorrhoid. Asked patient if he is using his suppository. He has not been using this. Discussed using this at night for the next week to see if it helps. He states he will do this.

## 2014-11-24 NOTE — Telephone Encounter (Signed)
Agreed. He should use the suppository as he is not a candidate for banding without being able to hold anticoagulation. thanks

## 2014-11-27 ENCOUNTER — Telehealth: Payer: Self-pay | Admitting: Gastroenterology

## 2014-11-27 ENCOUNTER — Other Ambulatory Visit: Payer: Self-pay | Admitting: Cardiovascular Disease

## 2014-11-27 NOTE — Telephone Encounter (Signed)
Spoke with patient again. He states he had a rough weekend. States he is having bleeding with every bowel movement. He is asking to have the hemorrhoids "tied up". He is using the suppositories. Tried to explain the need to be off blood thinner but patient insists they can be "tied up." Please, advise.

## 2014-11-27 NOTE — Telephone Encounter (Signed)
Called patient. He is having some intermittent red blood in the stools, variable frequency. He has some discomfort in his anal area. These symptoms are almost certainly related to hemorrhoids noted on colonoscopy, that were large and one was ulcerated, in the setting of anticoagulation. I have recommended daily fiber supplement and anusol suppositories which he is not taking regularly. I asked him again to do this. Given his anticoagulation needs (new clot and on Xarelto for about a month), I don't think he is a good candidate for banding or surgery right now. Recommend he have a CBC drawn tomorrow to ensure stable. If he is becoming anemic from this, will discuss with his primary a change in anticoagulation or to see when he is eligible for banding or surgery. The patient verbalized understanding. He otherwise asked for a note to excuse him from a class he was attending for this past Saturday. Rollene Fare can you help with the letter and order a CBC for him? thanks

## 2014-11-28 ENCOUNTER — Other Ambulatory Visit: Payer: Self-pay | Admitting: *Deleted

## 2014-11-28 ENCOUNTER — Other Ambulatory Visit (INDEPENDENT_AMBULATORY_CARE_PROVIDER_SITE_OTHER): Payer: Medicare Other

## 2014-11-28 ENCOUNTER — Encounter: Payer: Self-pay | Admitting: *Deleted

## 2014-11-28 ENCOUNTER — Telehealth: Payer: Self-pay | Admitting: Gastroenterology

## 2014-11-28 DIAGNOSIS — K625 Hemorrhage of anus and rectum: Secondary | ICD-10-CM

## 2014-11-28 LAB — CBC WITH DIFFERENTIAL/PLATELET
Basophils Absolute: 0.1 K/uL (ref 0.0–0.1)
Basophils Relative: 0.8 % (ref 0.0–3.0)
Eosinophils Absolute: 0.2 K/uL (ref 0.0–0.7)
Eosinophils Relative: 2.5 % (ref 0.0–5.0)
HCT: 35.3 % — ABNORMAL LOW (ref 39.0–52.0)
Hemoglobin: 11.2 g/dL — ABNORMAL LOW (ref 13.0–17.0)
Lymphocytes Relative: 23.1 % (ref 12.0–46.0)
Lymphs Abs: 1.5 K/uL (ref 0.7–4.0)
MCHC: 31.7 g/dL (ref 30.0–36.0)
MCV: 93.9 fl (ref 78.0–100.0)
Monocytes Absolute: 0.5 K/uL (ref 0.1–1.0)
Monocytes Relative: 8.2 % (ref 3.0–12.0)
Neutro Abs: 4.4 K/uL (ref 1.4–7.7)
Neutrophils Relative %: 65.4 % (ref 43.0–77.0)
Platelets: 166 K/uL (ref 150.0–400.0)
RBC: 3.75 Mil/uL — ABNORMAL LOW (ref 4.22–5.81)
RDW: 14.3 % (ref 11.5–15.5)
WBC: 6.7 K/uL (ref 4.0–10.5)

## 2014-11-28 NOTE — Telephone Encounter (Signed)
Letter faxed and mailed to patient. Patient aware.

## 2014-11-28 NOTE — Telephone Encounter (Signed)
Lab in [facility]. 

## 2014-12-11 ENCOUNTER — Telehealth: Payer: Self-pay | Admitting: Gastroenterology

## 2014-12-11 NOTE — Telephone Encounter (Signed)
Patient missed school again Saturday  12/09/14 because of hemorrhoids. Would like letter faxed to [phone]. Is it ok to send a letter?

## 2014-12-11 NOTE — Telephone Encounter (Signed)
That's okay. Has he had his surgical evaluation yet for this issue? Thanks for any update.

## 2014-12-12 NOTE — Telephone Encounter (Signed)
Letter faxed for pt.

## 2015-01-02 ENCOUNTER — Ambulatory Visit (INDEPENDENT_AMBULATORY_CARE_PROVIDER_SITE_OTHER): Payer: Medicare Other | Admitting: Cardiovascular Disease

## 2015-01-02 ENCOUNTER — Encounter: Payer: Self-pay | Admitting: Cardiovascular Disease

## 2015-01-02 VITALS — BP 130/82 | HR 74 | Ht 68.0 in | Wt 224.2 lb

## 2015-01-02 DIAGNOSIS — I82411 Acute embolism and thrombosis of right femoral vein: Secondary | ICD-10-CM

## 2015-01-02 DIAGNOSIS — I255 Ischemic cardiomyopathy: Secondary | ICD-10-CM | POA: Diagnosis not present

## 2015-01-02 DIAGNOSIS — I5022 Chronic systolic (congestive) heart failure: Secondary | ICD-10-CM

## 2015-01-02 DIAGNOSIS — I10 Essential (primary) hypertension: Secondary | ICD-10-CM

## 2015-01-02 DIAGNOSIS — I2510 Atherosclerotic heart disease of native coronary artery without angina pectoris: Secondary | ICD-10-CM | POA: Diagnosis not present

## 2015-01-02 NOTE — Assessment & Plan Note (Signed)
He has no symptoms of angina. Continue medical therapy. 

## 2015-01-02 NOTE — Patient Instructions (Signed)
Medication Instructions: Continue same medications.   Labwork: None.   Procedures/Testing: None.   Follow-Up: 6 months with Dr. Arida.   Any Additional Special Instructions Will Be Listed Below (If Applicable).   

## 2015-01-02 NOTE — Assessment & Plan Note (Signed)
He appears to be euvolemic with no significant symptoms related to this. He is on optimal medical therapy. Most recent creatinine was 2.1.

## 2015-01-02 NOTE — Progress Notes (Signed)
HPI  This is a 59 year old man who is here today for followup visit. He has known history of coronary artery disease status post myocardial infarction with late presentation. He is being treated medically and did not have cardiac catheterization done due to chronic kidney disease (creatinine around 2.5) and late presentation. He had a nuclear stress test in January of 2013 which showed mostly a large transmural infarct in the left circumflex distribution without significant ischemia. EF was 40-45% with moderate mitral regurgitation initially. Most recent echocardiogram in March 2014 showed an ejection fraction of 40% with mild mitral regurgitation. He had worsening right leg edema in August and went to the emergency room where he was diagnosed with right lower extremity DVT involving the femoral, popliteal and posterior tibial and peroneal veins. VQ scan was low probability for pulmonary embolism. He was anticoagulated with Xarelto but returned few weeks later with lower GI bleed due to hemorrhoids. He hasn't had anymore bleeding in the last few weeks. He has been feeling better. He denies any chest pain or shortness of breath. He does complain of mild bilateral calf pain after walking about 20 minutes on the treadmill.   Allergies  Allergen Reactions  . Nifedipine Diarrhea  . Benazepril Other (See Comments)  . Haloperidol And Related Other (See Comments)  . Omeprazole Other (See Comments)     Current Outpatient Prescriptions on File Prior to Visit  Medication Sig Dispense Refill  . amLODipine (NORVASC) 10 MG tablet Take 10 mg by mouth daily.    . ARIPiprazole (ABILIFY) 20 MG tablet Take 20 mg by mouth 2 (two) times daily.    [name] buPROPion (WELLBUTRIN) 100 MG tablet Take 200 mg by mouth 2 (two) times daily.     . Cholecalciferol 1000 UNITS capsule Take 2,000 Units by mouth daily.    [name] docusate sodium (COLACE) 100 MG capsule Take 1 capsule (100 mg total) by mouth 2 (two) times daily. 10  capsule 0  . hydrALAZINE (APRESOLINE) 100 MG tablet Take 100 mg by mouth 2 (two) times daily.    [name] HYDROcodone-acetaminophen (NORCO/VICODIN) 5-325 MG per tablet Take 1 tablet by mouth every 6 (six) hours as needed for severe pain.     . hydrocortisone (ANUSOL-HC) 25 MG suppository Insert one rectally daily 12 suppository 0  . isosorbide mononitrate (IMDUR) 60 MG 24 hr tablet Take 1 tablet (60 mg total) by mouth every morning. 90 tablet 4  . latanoprost (XALATAN) 0.005 % ophthalmic solution Place 1 drop into both eyes at bedtime.     [name] LORazepam (ATIVAN) 1 MG tablet Take 2 mg by mouth at bedtime.     . metoprolol (LOPRESSOR) 100 MG tablet Take 200 mg by mouth 2 (two) times daily.     . pantoprazole (PROTONIX) 40 MG tablet Take 40 mg by mouth daily.    . polyethylene glycol (MIRALAX) packet Take 17 g by mouth daily. 14 each 0  . QUEtiapine (SEROQUEL) 400 MG tablet Take 400 mg by mouth at bedtime.    . ranitidine (ZANTAC) 150 MG tablet Take 150 mg by mouth 2 (two) times daily as needed for heartburn.     . rivaroxaban (XARELTO) 20 MG TABS tablet Take 20 mg by mouth daily with supper.     . sertraline (ZOLOFT) 100 MG tablet Take 150 mg by mouth daily.     . sildenafil (VIAGRA) 50 MG tablet Take 50 mg by mouth daily as needed for erectile dysfunction.    . simvastatin (  ZOCOR) 40 MG tablet take 1 tablet by mouth at bedtime 90 tablet 3  . spironolactone (ALDACTONE) 25 MG tablet Take 50 mg by mouth at bedtime.      No current facility-administered medications on file prior to visit.     Past Medical History  Diagnosis Date  . Acute myocardial infarction, subendocardial infarction, initial episode of care ([place])   . Other primary cardiomyopathies   . Esophageal reflux   . Depressive disorder, not elsewhere classified   . Gout, unspecified   . Unspecified schizophrenia, unspecified condition   . Encounter for long-term (current) use of other medications   . Personal history of tobacco use,  presenting hazards to health   . Hypotension, unspecified   . Mitral valve disorders     moderate MR  . Coronary atherosclerosis of native coronary artery   . Hypertension   . Other and unspecified hyperlipidemia   . Torn ligament   . Ischemic cardiomyopathy   . Hx of echocardiogram 2014    EF 40%  . CKD (chronic kidney disease) stage 3, GFR 30-59 ml/min     baseline Cr around 2.5  . 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 CHF (congestive heart failure) ([place])   . History of DVT (deep vein thrombosis)      Past Surgical History  Procedure Laterality Date  . None    . Colonoscopy    . Eye surgery    . Colonoscopy N/A 11/10/2014    Procedure: COLONOSCOPY;  Surgeon: Manus Gunning, MD;  Location: [facility];  Service: Gastroenterology;  Laterality: N/A;     Family History  Problem Relation Age of Onset  . Family history unknown: Yes     Social History   Social History  . Marital Status: Divorced    Spouse Name: N/A  . Number of Children: N/A  . Years of Education: N/A   Occupational History  . Not on file.   Social History Main Topics  . Smoking status: Former Smoker -- 1.00 packs/day for 23 years    Types: Cigarettes    Quit date: 02/25/1996  . Smokeless tobacco: Never Used     Comment: + 15 years of smoking  . Alcohol Use: No  . Drug Use: No  . Sexual Activity: Not on file   Other Topics Concern  . Not on file   Social History Narrative     PHYSICAL EXAM   BP 130/82 mmHg  Pulse 74  Ht 5\' 8"  (1.727 m)  Wt 224 lb 4 oz (101.719 kg)  BMI 34.10 kg/m2  Constitutional: He is oriented to person, place, and time. He appears well-developed and well-nourished. No distress.  HENT: No nasal discharge.  Head: Normocephalic and atraumatic.  Eyes: Pupils are equal and round. Right eye exhibits no discharge. Left eye exhibits no discharge.  Neck: Normal range  of motion. Neck supple. No JVD present. No thyromegaly present.  Cardiovascular: Normal rate, regular rhythm, normal heart sounds and. Exam reveals no gallop and no friction rub. No murmur heard.  Pulmonary/Chest: Effort normal and breath sounds normal. No stridor. No respiratory distress. He has no wheezes. He has no rales. He exhibits no tenderness.  Abdominal: Soft. Bowel sounds are normal. He exhibits no distension. There is no tenderness. There is no rebound and no guarding.  Musculoskeletal: Normal range of motion. He exhibits no edema and no tenderness.  Neurological: He  is alert and oriented to person, place, and time. Coordination normal.  Skin: Skin is warm and dry. No rash noted. He is not diaphoretic. No erythema. No pallor.  Psychiatric: He has a normal mood and affect. His behavior is normal. Judgment and thought content normal.     EKG: Normal sinus rhythm with nonspecific T wave changes.  ASSESSMENT AND PLAN

## 2015-01-02 NOTE — Assessment & Plan Note (Signed)
It appears that he had extensive DVT of the right lower extremity which was unprovoked. Lifelong anticoagulation should be considered. However, he had complications with lower GI bleed. He might require definitive treatment for his hemorrhoids if he has recurrent bleeding episodes. The other consideration should be to switch him from Xarelto to Eliquis given his chronic kidney disease.

## 2015-01-02 NOTE — Assessment & Plan Note (Signed)
Blood pressure is controlled on current medications. 

## 2015-01-15 ENCOUNTER — Ambulatory Visit: Payer: Medicare Other | Admitting: Gastroenterology

## 2015-04-12 ENCOUNTER — Other Ambulatory Visit: Payer: Self-pay | Admitting: *Deleted

## 2015-04-12 DIAGNOSIS — F200 Paranoid schizophrenia: Secondary | ICD-10-CM | POA: Diagnosis not present

## 2015-04-12 DIAGNOSIS — I5022 Chronic systolic (congestive) heart failure: Secondary | ICD-10-CM | POA: Diagnosis not present

## 2015-04-12 DIAGNOSIS — Z6830 Body mass index (BMI) 30.0-30.9, adult: Secondary | ICD-10-CM | POA: Diagnosis not present

## 2015-04-12 DIAGNOSIS — K921 Melena: Secondary | ICD-10-CM | POA: Diagnosis not present

## 2015-04-12 DIAGNOSIS — K644 Residual hemorrhoidal skin tags: Secondary | ICD-10-CM | POA: Diagnosis not present

## 2015-04-12 MED ORDER — SIMVASTATIN 40 MG PO TABS: 40.0000 mg | ORAL_TABLET | Freq: Every day | ORAL | Status: DC

## 2015-04-16 DIAGNOSIS — K921 Melena: Secondary | ICD-10-CM | POA: Diagnosis not present

## 2015-04-16 DIAGNOSIS — Z418 Encounter for other procedures for purposes other than remedying health state: Secondary | ICD-10-CM | POA: Diagnosis not present

## 2015-04-16 DIAGNOSIS — I10 Essential (primary) hypertension: Secondary | ICD-10-CM | POA: Diagnosis not present

## 2015-04-16 DIAGNOSIS — F200 Paranoid schizophrenia: Secondary | ICD-10-CM | POA: Diagnosis not present

## 2015-04-16 DIAGNOSIS — K644 Residual hemorrhoidal skin tags: Secondary | ICD-10-CM | POA: Diagnosis not present

## 2015-04-17 DIAGNOSIS — Z79899 Other long term (current) drug therapy: Secondary | ICD-10-CM | POA: Diagnosis not present

## 2015-04-17 DIAGNOSIS — R109 Unspecified abdominal pain: Secondary | ICD-10-CM | POA: Diagnosis not present

## 2015-04-17 DIAGNOSIS — Z7982 Long term (current) use of aspirin: Secondary | ICD-10-CM | POA: Diagnosis not present

## 2015-04-17 DIAGNOSIS — K649 Unspecified hemorrhoids: Secondary | ICD-10-CM | POA: Diagnosis not present

## 2015-04-17 DIAGNOSIS — K219 Gastro-esophageal reflux disease without esophagitis: Secondary | ICD-10-CM | POA: Diagnosis not present

## 2015-04-17 DIAGNOSIS — I10 Essential (primary) hypertension: Secondary | ICD-10-CM | POA: Diagnosis not present

## 2015-04-17 DIAGNOSIS — Z87891 Personal history of nicotine dependence: Secondary | ICD-10-CM | POA: Diagnosis not present

## 2015-04-17 DIAGNOSIS — F329 Major depressive disorder, single episode, unspecified: Secondary | ICD-10-CM | POA: Diagnosis not present

## 2015-04-17 DIAGNOSIS — T447X1A Poisoning by beta-adrenoreceptor antagonists, accidental (unintentional), initial encounter: Secondary | ICD-10-CM | POA: Diagnosis not present

## 2015-04-17 DIAGNOSIS — T50901A Poisoning by unspecified drugs, medicaments and biological substances, accidental (unintentional), initial encounter: Secondary | ICD-10-CM | POA: Diagnosis not present

## 2015-04-17 DIAGNOSIS — F209 Schizophrenia, unspecified: Secondary | ICD-10-CM | POA: Diagnosis not present

## 2015-04-17 DIAGNOSIS — F419 Anxiety disorder, unspecified: Secondary | ICD-10-CM | POA: Diagnosis not present

## 2015-04-17 DIAGNOSIS — M109 Gout, unspecified: Secondary | ICD-10-CM | POA: Diagnosis not present

## 2015-04-18 ENCOUNTER — Telehealth: Payer: Self-pay | Admitting: Nurse Practitioner

## 2015-04-18 ENCOUNTER — Other Ambulatory Visit: Payer: Self-pay

## 2015-04-18 ENCOUNTER — Encounter: Payer: Self-pay | Admitting: Nurse Practitioner

## 2015-04-18 ENCOUNTER — Ambulatory Visit (INDEPENDENT_AMBULATORY_CARE_PROVIDER_SITE_OTHER): Payer: Medicare Other | Admitting: Nurse Practitioner

## 2015-04-18 VITALS — BP 152/84 | HR 85 | Temp 97.6°F | Ht 68.0 in | Wt 221.2 lb

## 2015-04-18 DIAGNOSIS — K625 Hemorrhage of anus and rectum: Secondary | ICD-10-CM | POA: Diagnosis not present

## 2015-04-18 DIAGNOSIS — K635 Polyp of colon: Secondary | ICD-10-CM | POA: Diagnosis not present

## 2015-04-18 DIAGNOSIS — K649 Unspecified hemorrhoids: Secondary | ICD-10-CM

## 2015-04-18 DIAGNOSIS — D500 Iron deficiency anemia secondary to blood loss (chronic): Secondary | ICD-10-CM | POA: Diagnosis not present

## 2015-04-18 DIAGNOSIS — D649 Anemia, unspecified: Secondary | ICD-10-CM

## 2015-04-18 MED ORDER — PEG 3350-KCL-NA BICARB-NACL 420 G PO SOLR: 4000.0000 mL | Freq: Once | ORAL | Status: DC

## 2015-04-18 NOTE — Assessment & Plan Note (Signed)
Patient with a recent colonoscopy in September 2016 for hematochezia. Noted 8 mm and 4 mm polyps which were not removed because he was on Xarelto at that time. Needs a repeat colonoscopy for polypectomy and is also requesting consideration of hemorrhoid banding at that time. We'll proceed with colonoscopy at this time.  Proceed with colonoscopy +/- hemorrhoid banding with Dr. Oneida Alar in the near future. The risks, benefits, and alternatives have been discussed in detail with the patient. They state understanding and desire to proceed.   The patient has been off Xarelto for 1 week. He is on Zoloft, Seroquel, Ativan. We'll proceed with the procedure and the OR on propofol/MAC to promote adequate sedation given polypharmacy.

## 2015-04-18 NOTE — Telephone Encounter (Signed)
Noted. See phone note for orders and follow-up.

## 2015-04-18 NOTE — Patient Instructions (Signed)
1. We will request your recent labs from your primary care doctor. 2. We will schedule your procedure for you. 3. Further recommendations to be based on results of your procedure. 4. Return for follow-up as needed for worsening symptoms or based on postprocedure recommendations.

## 2015-04-18 NOTE — Progress Notes (Signed)
cc'ed to pcp °

## 2015-04-18 NOTE — Telephone Encounter (Signed)
Please notify patient I'd like a repeat CBC on Friday. Also, see office note addendum for ER precautions and please add +/- EGD to his TCS to give the option if no definitive source found, given significant anemia.

## 2015-04-18 NOTE — Assessment & Plan Note (Signed)
Rectal bleeding deemed likely to large internal hemorrhoids. He is requesting band ligation if possible. No further bleeding since discontinuing Xarelto by PCP. Continue to monitor.

## 2015-04-18 NOTE — Telephone Encounter (Signed)
EG you are seeing this patient this morning and the PCP called to say that his RBC was 3.46 and Hemoglobin was 8.8.  They will be faxing all his labs here shortly and I will bring to you office.

## 2015-04-18 NOTE — Progress Notes (Signed)
Labs received from PCP dated 04/16/15. H/H 8.8/29.0. Patient states his bleeding has stopped about 3-4 days ago. Will recheck CBC in a couple days. Will notify the patient if he sees any more bleeding or becomes dizzy, lightheaded, chest pain, shortness of breath, weakness, or other abnormal symptoms to call his PCP or [place] ASAP or go to the ER if we're not open.  Has a history of GERD although currnetly asymptomatic on PPI. Will add +/- EGD to his colonoscopy to allow further evaluation if no definitive source found on colonoscopy.

## 2015-04-18 NOTE — Progress Notes (Signed)
Primary Care Physician:  Glenda Chroman., MD Primary Gastroenterologist:  Dr. Oneida Alar  Chief Complaint  Patient presents with  . Blood In Stools  . Hemorrhoids    HPI:   Glenn Hawkins is a 60 y.o. male who presents for rectal bleeding. PCP notes reviewed, last saw primary care on 04/16/2015 for recheck of bloody stools which were sudden and intermittent for days, noted to be recurrent, blood mixed in stools. Denies abdominal pain or dizziness. Previously saw [facility] in [place]. Colonoscopy completed 11/10/2014 for hematochezia after starting Xarelto for DVT. Noted 8 mm polyp in the transverse colon and 4 mm polyp in the descending colon, neither which were removed due to anticoagulated state for recently newly diagnosed DVT. Noted prep only fair in the right colon. Noted large internal hemorrhoids one of which was ulcerated and the likely cause of bleeding. Recommend continue Xarelto for DVT, monitor for recurrent bleeding, consider banding or surgical therapy if bleeding persists. Recommend repeat colonoscopy went off anticoagulation. Based on telephone note she did become mildly anemic from his persistent rectal bleeding with a hemoglobin of 11.2 which point he was referred to surgery for evaluation of banding versus surgical options given anticoagulated state. No notes in the system to indicate whether that surgical evaluation was completed or what the determination was. Per primary care notes his Xarelto was discontinued on 04/16/2015 due to persistent bleeding.  Today he states his Xarelto was actually discontinued 1 week ago. His bleeding has stopped 3 days ago since stopping Xarelto. States he had to have a transfusion in November or December 2016. Had CBC drawn last week. Has been having abdominal pain in generalized abdomen, occurring for a week, no better or worse, intermittent, dull pain. Denies N/V, melena, unintentional weight loss, fever, chills. Bowel movements started  changing between diarrhea and formed stool within the past week. Denies chest pain, dyspnea, dizziness, lightheadedness, syncope, near syncope. Denies any other upper or lower GI symptoms.  Past Medical History  Diagnosis Date  . Acute myocardial infarction, subendocardial infarction, initial episode of care ([place])   . Other primary cardiomyopathies   . Esophageal reflux   . Depressive disorder, not elsewhere classified   . Gout, unspecified   . Unspecified schizophrenia, unspecified condition   . Encounter for long-term (current) use of other medications   . Personal history of tobacco use, presenting hazards to health   . Hypotension, unspecified   . Mitral valve disorders     moderate MR  . Coronary atherosclerosis of native coronary artery   . Hypertension   . Other and unspecified hyperlipidemia   . Torn ligament   . Ischemic cardiomyopathy   . Hx of echocardiogram 2014    EF 40%  . CKD (chronic kidney disease) stage 3, GFR 30-59 ml/min     baseline Cr around 2.5  . 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 CHF (congestive heart failure) ([place])   . History of DVT (deep vein thrombosis)     Past Surgical History  Procedure Laterality Date  . None    . Colonoscopy    . Eye surgery    . Colonoscopy N/A 11/10/2014    Procedure: COLONOSCOPY;  Surgeon: Manus Gunning, MD;  Location: [facility];  Service: Gastroenterology;  Laterality: N/A;    Current Outpatient Prescriptions  Medication Sig Dispense Refill  . amLODipine (NORVASC) 10 MG tablet Take 10 mg  by mouth daily.    . ARIPiprazole (ABILIFY) 20 MG tablet Take 20 mg by mouth 2 (two) times daily.    [name] buPROPion (WELLBUTRIN) 100 MG tablet Take 200 mg by mouth 2 (two) times daily.     . Cholecalciferol 1000 UNITS capsule Take 2,000 Units by mouth daily.    . hydrALAZINE (APRESOLINE) 100 MG tablet Take 100 mg by mouth 2 (two)  times daily.    . hydrocortisone (ANUSOL-HC) 25 MG suppository Insert one rectally daily 12 suppository 0  . isosorbide mononitrate (IMDUR) 60 MG 24 hr tablet Take 1 tablet (60 mg total) by mouth every morning. 90 tablet 4  . latanoprost (XALATAN) 0.005 % ophthalmic solution Place 1 drop into both eyes at bedtime.     [name] LORazepam (ATIVAN) 1 MG tablet Take 2 mg by mouth at bedtime.     . metoprolol (LOPRESSOR) 100 MG tablet Take 200 mg by mouth 2 (two) times daily.     . pantoprazole (PROTONIX) 40 MG tablet Take 40 mg by mouth daily.    . QUEtiapine (SEROQUEL) 400 MG tablet Take 400 mg by mouth at bedtime.    . sertraline (ZOLOFT) 100 MG tablet Take 150 mg by mouth daily.     . sildenafil (VIAGRA) 50 MG tablet Take 50 mg by mouth daily as needed for erectile dysfunction.    . simvastatin (ZOCOR) 40 MG tablet Take 1 tablet (40 mg total) by mouth at bedtime. 90 tablet 3  . spironolactone (ALDACTONE) 25 MG tablet Take 50 mg by mouth at bedtime.     . docusate sodium (COLACE) 100 MG capsule Take 1 capsule (100 mg total) by mouth 2 (two) times daily. (Patient not taking: Reported on 04/18/2015) 10 capsule 0  . HYDROcodone-acetaminophen (NORCO/VICODIN) 5-325 MG per tablet Take 1 tablet by mouth every 6 (six) hours as needed for severe pain. Reported on 04/18/2015    . polyethylene glycol (MIRALAX) packet Take 17 g by mouth daily. (Patient not taking: Reported on 04/18/2015) 14 each 0  . ranitidine (ZANTAC) 150 MG tablet Take 150 mg by mouth 2 (two) times daily as needed for heartburn. Reported on 04/18/2015    . rivaroxaban (XARELTO) 20 MG TABS tablet Take 20 mg by mouth daily with supper. Reported on 04/18/2015     No current facility-administered medications for this visit.    Allergies as of 04/18/2015 - Review Complete 04/18/2015  Allergen Reaction Noted  . Nifedipine Diarrhea 12/08/2013  . Benazepril Other (See Comments) 03/07/2011  . Haloperidol and related Other (See Comments) 03/07/2011  .  Omeprazole Other (See Comments) 03/07/2011    Family History  Problem Relation Age of Onset  . Colon cancer Neg Hx     Social History   Social History  . Marital Status: Divorced    Spouse Name: N/A  . Number of Children: N/A  . Years of Education: N/A   Occupational History  . Not on file.   Social History Main Topics  . Smoking status: Former Smoker -- 1.00 packs/day for 23 years    Types: Cigarettes    Quit date: 02/25/1996  . Smokeless tobacco: Never Used     Comment: + 15 years of smoking  . Alcohol Use: No  . Drug Use: No  . Sexual Activity: Not on file   Other Topics Concern  . Not on file   Social History Narrative    Review of Systems: 10-point ROS negative except as per HPI.    Physical  Exam: BP 152/84 mmHg  Pulse 85  Temp(Src) 97.6 F (36.4 C) (Oral)  Ht 5\' 8"  (1.727 m)  Wt 221 lb 3.2 oz (100.336 kg)  BMI 33.64 kg/m2 General:   Alert and oriented. Pleasant and cooperative. Well-nourished and well-developed.  Head:  Normocephalic and atraumatic. Eyes:  Without icterus, sclera clear and conjunctiva pink.  Ears:  Normal auditory acuity. Cardiovascular:  S1, S2 present without murmurs appreciated. Extremities without clubbing or edema. Respiratory:  Clear to auscultation bilaterally. No wheezes, rales, or rhonchi. No distress.  Gastrointestinal:  +BS, soft, and non-distended. Mild generalized TTP. No HSM noted. No guarding or rebound. No masses appreciated.  Rectal:  Deferred  Musculoskalatal:  Symmetrical without gross deformities. Neurologic:  Alert and oriented x4;  grossly normal neurologically. Psych:  Alert and cooperative. Normal mood and affect. Heme/Lymph/Immune: No excessive bruising noted.    04/18/2015 9:47 AM

## 2015-04-18 NOTE — Telephone Encounter (Signed)
Orders entered for EGD

## 2015-04-18 NOTE — Assessment & Plan Note (Signed)
Patient with large hemorrhoids noted on last colonoscopy dated September 2016. He is requesting band ligation if possible. We'll proceed with colonoscopy to further evaluate, removed polyps as noted below, and possible banding.  Proceed with colonoscopy +/- hemorrhoid banding with Dr. Oneida Alar in the near future. The risks, benefits, and alternatives have been discussed in detail with the patient. They state understanding and desire to proceed.   The patient has been off Xarelto for 1 week. He is on Zoloft, Seroquel, Ativan. We'll proceed with the procedure and the OR on propofol/MAC to promote adequate sedation given polypharmacy.

## 2015-04-18 NOTE — Telephone Encounter (Signed)
Called pt and LMOM to call office back  

## 2015-04-20 ENCOUNTER — Other Ambulatory Visit: Payer: Self-pay

## 2015-04-20 ENCOUNTER — Telehealth: Payer: Self-pay | Admitting: Nurse Practitioner

## 2015-04-20 DIAGNOSIS — D649 Anemia, unspecified: Secondary | ICD-10-CM

## 2015-04-20 NOTE — Telephone Encounter (Signed)
Called pt and spoke with mother. Told her to have pt get labs done today. States pt was out

## 2015-04-20 NOTE — Telephone Encounter (Signed)
Spoke with pt mother. She will relay message to pt about labs that need to be completed today Labs sent to [facility]

## 2015-04-20 NOTE — Telephone Encounter (Signed)
Please call patient and remind him to have CBC drawn today. Also, ask if he's seen any further bleeding.. Also ask about any anemia symptoms (chest pain, shortness of breath, worsening fatigue/weakness, dizziness, etc).

## 2015-04-23 DIAGNOSIS — R5383 Other fatigue: Secondary | ICD-10-CM | POA: Diagnosis not present

## 2015-04-24 ENCOUNTER — Other Ambulatory Visit (HOSPITAL_COMMUNITY): Payer: Medicare Other

## 2015-04-24 DIAGNOSIS — D649 Anemia, unspecified: Secondary | ICD-10-CM | POA: Diagnosis not present

## 2015-04-24 LAB — CBC
HCT: 28.1 % — ABNORMAL LOW (ref 39.0–52.0)
HEMOGLOBIN: 8.3 g/dL — AB (ref 13.0–17.0)
MCH: 24.6 pg — ABNORMAL LOW (ref 26.0–34.0)
MCHC: 29.5 g/dL — ABNORMAL LOW (ref 30.0–36.0)
MCV: 83.1 fL (ref 78.0–100.0)
MPV: 10.8 fL (ref 8.6–12.4)
Platelets: 210 10*3/uL (ref 150–400)
RBC: 3.38 MIL/uL — ABNORMAL LOW (ref 4.22–5.81)
RDW: 18.5 % — ABNORMAL HIGH (ref 11.5–15.5)
WBC: 6.1 10*3/uL (ref 4.0–10.5)

## 2015-04-25 ENCOUNTER — Other Ambulatory Visit: Payer: Self-pay | Admitting: Nurse Practitioner

## 2015-04-25 DIAGNOSIS — D62 Acute posthemorrhagic anemia: Secondary | ICD-10-CM

## 2015-04-25 DIAGNOSIS — D500 Iron deficiency anemia secondary to blood loss (chronic): Secondary | ICD-10-CM | POA: Diagnosis not present

## 2015-04-26 DIAGNOSIS — D500 Iron deficiency anemia secondary to blood loss (chronic): Secondary | ICD-10-CM | POA: Diagnosis not present

## 2015-05-02 DIAGNOSIS — F200 Paranoid schizophrenia: Secondary | ICD-10-CM | POA: Diagnosis not present

## 2015-05-02 DIAGNOSIS — I5022 Chronic systolic (congestive) heart failure: Secondary | ICD-10-CM | POA: Diagnosis not present

## 2015-05-02 DIAGNOSIS — N189 Chronic kidney disease, unspecified: Secondary | ICD-10-CM | POA: Diagnosis not present

## 2015-05-02 DIAGNOSIS — J209 Acute bronchitis, unspecified: Secondary | ICD-10-CM | POA: Diagnosis not present

## 2015-05-02 DIAGNOSIS — I10 Essential (primary) hypertension: Secondary | ICD-10-CM | POA: Diagnosis not present

## 2015-05-02 NOTE — Patient Instructions (Addendum)
31    Your procedure is scheduled on: 05/08/2015  Report to [facility] at     10:00 AM.  Call this number if you have problems the morning of surgery: [phone]   Remember:   Do not drink or eat food:After Midnight.   .  Take these medicines the morning of surgery with A SIP OF WATER: Amlodipine, Wellbutrin,  Isosorbide, Metoprolol and protonix   Do not wear jewelry, make-up or nail polish.  Do not wear lotions, powders, or perfumes. You may wear deodorant.  Do not shave 48 hours prior to surgery. Men may shave face and neck.  Do not bring valuables to the hospital.  Contacts, dentures or bridgework may not be worn into surgery.  Leave suitcase in the car. After surgery it may be brought to your room.  For patients admitted to the hospital, checkout time is 11:00 AM the day of discharge.   Patients discharged the day of surgery will not be allowed to drive home.  Name and phone number of your driver:    Please read over the following fact sheets that you were given: Pain Booklet, Lab Information and Anesthesia Post-op Instructions   Endoscopy Care After Please read the instructions outlined below and refer to this sheet in the next few weeks. These discharge instructions provide you with general information on caring for yourself after you leave the hospital. Your doctor may also give you specific instructions. While your treatment has been planned according to the most current medical practices available, unavoidable complications occasionally occur. If you have any problems or questions after discharge, please call your doctor. HOME CARE INSTRUCTIONS Activity  You may resume your regular activity but move at a slower pace for the next 24 hours.   Take frequent rest periods for the next 24 hours.   Walking will help expel (get rid of) the air and reduce the bloated feeling in your abdomen.   No driving for 24 hours (because of the anesthesia (medicine) used during the test).    You may shower.   Do not sign any important legal documents or operate any machinery for 24 hours (because of the anesthesia used during the test).  Nutrition  Drink plenty of fluids.   You may resume your normal diet.   Begin with a light meal and progress to your normal diet.   Avoid alcoholic beverages for 24 hours or as instructed by your caregiver.  Medications You may resume your normal medications unless your caregiver tells you otherwise. What you can expect today  You may experience abdominal discomfort such as a feeling of fullness or "gas" pains.   You may experience a sore throat for 2 to 3 days. This is normal. Gargling with salt water may help this.  Follow-up Your doctor will discuss the results of your test with you. SEEK IMMEDIATE MEDICAL CARE IF:  You have excessive nausea (feeling sick to your stomach) and/or vomiting.   You have severe abdominal pain and distention (swelling).   You have trouble swallowing.   You have a temperature over 100 F (37.8 C).   You have rectal bleeding or vomiting of blood.  Document Released: 09/25/2003 Document Revised: 01/30/2011 Document Reviewed: 04/07/2007 Colonoscopy, Care After Refer to this sheet in the next few weeks. These instructions provide you with information on caring for yourself after your procedure. Your health care provider may also give you more specific instructions. Your treatment has been planned according to current medical practices, but  problems sometimes occur. Call your health care provider if you have any problems or questions after your procedure. WHAT TO EXPECT AFTER THE PROCEDURE  After your procedure, it is typical to have the following:  A small amount of blood in your stool.  Moderate amounts of gas and mild abdominal cramping or bloating. HOME CARE INSTRUCTIONS  Do not drive, operate machinery, or sign important documents for 24 hours.  You may shower and resume your regular  physical activities, but move at a slower pace for the first 24 hours.  Take frequent rest periods for the first 24 hours.  Walk around or put a warm pack on your abdomen to help reduce abdominal cramping and bloating.  Drink enough fluids to keep your urine clear or pale yellow.  You may resume your normal diet as instructed by your health care provider. Avoid heavy or fried foods that are hard to digest.  Avoid drinking alcohol for 24 hours or as instructed by your health care provider.  Only take over-the-counter or prescription medicines as directed by your health care provider.  If a tissue sample (biopsy) was taken during your procedure:  Do not take aspirin or blood thinners for 7 days, or as instructed by your health care provider.  Do not drink alcohol for 7 days, or as instructed by your health care provider.  Eat soft foods for the first 24 hours. SEEK MEDICAL CARE IF: You have persistent spotting of blood in your stool 2-3 days after the procedure. SEEK IMMEDIATE MEDICAL CARE IF:  You have more than a small spotting of blood in your stool.  You pass large blood clots in your stool.  Your abdomen is swollen (distended).  You have nausea or vomiting.  You have a fever.  You have increasing abdominal pain that is not relieved with medicine.   This information is not intended to replace advice given to you by your health care provider. Make sure you discuss any questions you have with your health care provider.   Document Released: 09/25/2003 Document Revised: 12/01/2012 Document Reviewed: 10/18/2012 Elsevier Interactive Patient Education [employer].

## 2015-05-03 ENCOUNTER — Encounter (HOSPITAL_COMMUNITY)
Admission: RE | Admit: 2015-05-03 | Discharge: 2015-05-03 | Disposition: A | Discharge status: Home / Self Care | Payer: Medicare Other | Source: Ambulatory Visit | Attending: Gastroenterology | Admitting: Gastroenterology

## 2015-05-03 ENCOUNTER — Other Ambulatory Visit (HOSPITAL_COMMUNITY): Payer: Medicare Other

## 2015-05-03 ENCOUNTER — Encounter (HOSPITAL_COMMUNITY): Payer: Self-pay

## 2015-05-03 DIAGNOSIS — K625 Hemorrhage of anus and rectum: Secondary | ICD-10-CM | POA: Insufficient documentation

## 2015-05-03 DIAGNOSIS — Z01812 Encounter for preprocedural laboratory examination: Secondary | ICD-10-CM | POA: Diagnosis not present

## 2015-05-03 DIAGNOSIS — K649 Unspecified hemorrhoids: Secondary | ICD-10-CM | POA: Insufficient documentation

## 2015-05-03 LAB — CBC
HEMATOCRIT: 30.4 % — AB (ref 39.0–52.0)
HEMOGLOBIN: 9.6 g/dL — AB (ref 13.0–17.0)
MCH: 26.5 pg (ref 26.0–34.0)
MCHC: 31.6 g/dL (ref 30.0–36.0)
MCV: 84 fL (ref 78.0–100.0)
Platelets: 188 10*3/uL (ref 150–400)
RBC: 3.62 MIL/uL — AB (ref 4.22–5.81)
RDW: 16.8 % — AB (ref 11.5–15.5)
WBC: 9 10*3/uL (ref 4.0–10.5)

## 2015-05-03 LAB — BASIC METABOLIC PANEL
ANION GAP: 6 (ref 5–15)
BUN: 24 mg/dL — ABNORMAL HIGH (ref 6–20)
CALCIUM: 8.9 mg/dL (ref 8.9–10.3)
CO2: 23 mmol/L (ref 22–32)
Chloride: 112 mmol/L — ABNORMAL HIGH (ref 101–111)
Creatinine, Ser: 2.63 mg/dL — ABNORMAL HIGH (ref 0.61–1.24)
GFR calc Af Amer: 29 mL/min — ABNORMAL LOW (ref >60)
GFR calc non Af Amer: 25 mL/min — ABNORMAL LOW (ref >60)
Glucose, Bld: 127 mg/dL — ABNORMAL HIGH (ref 65–99)
Potassium: 4.4 mmol/L (ref 3.5–5.1)
Sodium: 141 mmol/L (ref 135–145)

## 2015-05-08 ENCOUNTER — Ambulatory Visit (HOSPITAL_COMMUNITY)
Admission: RE | Admit: 2015-05-08 | Discharge: 2015-05-08 | Disposition: A | Discharge status: Home / Self Care | Payer: Medicare Other | Source: Ambulatory Visit | Attending: Gastroenterology | Admitting: Gastroenterology

## 2015-05-08 ENCOUNTER — Encounter (HOSPITAL_COMMUNITY): Payer: Self-pay | Admitting: *Deleted

## 2015-05-08 ENCOUNTER — Ambulatory Visit (HOSPITAL_COMMUNITY): Admit: 2015-05-08 | Payer: Self-pay | Admitting: Gastroenterology

## 2015-05-08 ENCOUNTER — Encounter (HOSPITAL_COMMUNITY)
Admission: RE | Disposition: A | Discharge status: Home / Self Care | Payer: Self-pay | Source: Ambulatory Visit | Attending: Gastroenterology

## 2015-05-08 ENCOUNTER — Ambulatory Visit (HOSPITAL_COMMUNITY): Payer: Medicare Other | Admitting: Anesthesiology

## 2015-05-08 ENCOUNTER — Encounter (HOSPITAL_COMMUNITY): Payer: Self-pay

## 2015-05-08 DIAGNOSIS — I509 Heart failure, unspecified: Secondary | ICD-10-CM | POA: Diagnosis not present

## 2015-05-08 DIAGNOSIS — F329 Major depressive disorder, single episode, unspecified: Secondary | ICD-10-CM | POA: Diagnosis not present

## 2015-05-08 DIAGNOSIS — Z7901 Long term (current) use of anticoagulants: Secondary | ICD-10-CM | POA: Insufficient documentation

## 2015-05-08 DIAGNOSIS — K644 Residual hemorrhoidal skin tags: Secondary | ICD-10-CM | POA: Diagnosis not present

## 2015-05-08 DIAGNOSIS — N183 Chronic kidney disease, stage 3 (moderate): Secondary | ICD-10-CM | POA: Diagnosis not present

## 2015-05-08 DIAGNOSIS — K635 Polyp of colon: Secondary | ICD-10-CM | POA: Diagnosis not present

## 2015-05-08 DIAGNOSIS — Z86718 Personal history of other venous thrombosis and embolism: Secondary | ICD-10-CM | POA: Insufficient documentation

## 2015-05-08 DIAGNOSIS — D500 Iron deficiency anemia secondary to blood loss (chronic): Secondary | ICD-10-CM | POA: Insufficient documentation

## 2015-05-08 DIAGNOSIS — Z87891 Personal history of nicotine dependence: Secondary | ICD-10-CM | POA: Diagnosis not present

## 2015-05-08 DIAGNOSIS — F209 Schizophrenia, unspecified: Secondary | ICD-10-CM | POA: Insufficient documentation

## 2015-05-08 DIAGNOSIS — K648 Other hemorrhoids: Secondary | ICD-10-CM | POA: Insufficient documentation

## 2015-05-08 DIAGNOSIS — K649 Unspecified hemorrhoids: Secondary | ICD-10-CM | POA: Diagnosis not present

## 2015-05-08 DIAGNOSIS — I2510 Atherosclerotic heart disease of native coronary artery without angina pectoris: Secondary | ICD-10-CM | POA: Diagnosis not present

## 2015-05-08 DIAGNOSIS — K210 Gastro-esophageal reflux disease with esophagitis: Secondary | ICD-10-CM | POA: Diagnosis not present

## 2015-05-08 DIAGNOSIS — K219 Gastro-esophageal reflux disease without esophagitis: Secondary | ICD-10-CM | POA: Diagnosis not present

## 2015-05-08 DIAGNOSIS — I130 Hypertensive heart and chronic kidney disease with heart failure and stage 1 through stage 4 chronic kidney disease, or unspecified chronic kidney disease: Secondary | ICD-10-CM | POA: Insufficient documentation

## 2015-05-08 DIAGNOSIS — D123 Benign neoplasm of transverse colon: Secondary | ICD-10-CM | POA: Diagnosis not present

## 2015-05-08 DIAGNOSIS — K921 Melena: Secondary | ICD-10-CM | POA: Diagnosis not present

## 2015-05-08 DIAGNOSIS — D124 Benign neoplasm of descending colon: Secondary | ICD-10-CM | POA: Diagnosis not present

## 2015-05-08 DIAGNOSIS — Z79899 Other long term (current) drug therapy: Secondary | ICD-10-CM | POA: Diagnosis not present

## 2015-05-08 DIAGNOSIS — K625 Hemorrhage of anus and rectum: Secondary | ICD-10-CM | POA: Diagnosis not present

## 2015-05-08 HISTORY — PX: ESOPHAGOGASTRODUODENOSCOPY (EGD) WITH PROPOFOL: SHX5813

## 2015-05-08 HISTORY — PX: POLYPECTOMY: SHX5525

## 2015-05-08 HISTORY — PX: COLONOSCOPY WITH PROPOFOL: SHX5780

## 2015-05-08 SURGERY — COLONOSCOPY WITH PROPOFOL
Anesthesia: Monitor Anesthesia Care

## 2015-05-08 MED ORDER — FENTANYL CITRATE (PF) 100 MCG/2ML IJ SOLN
INTRAMUSCULAR | Status: AC
  Filled 2015-05-08: qty 2

## 2015-05-08 MED ORDER — MIDAZOLAM HCL 5 MG/5ML IJ SOLN
INTRAMUSCULAR | Status: DC | PRN
  Administered 2015-05-08: 2 mg via INTRAVENOUS

## 2015-05-08 MED ORDER — LINACLOTIDE 145 MCG PO CAPS
ORAL_CAPSULE | ORAL | Status: DC
Start: 2015-05-08 — End: 2016-02-20

## 2015-05-08 MED ORDER — LIDOCAINE VISCOUS 2 % MT SOLN
OROMUCOSAL | Status: AC
  Filled 2015-05-08: qty 15

## 2015-05-08 MED ORDER — MIDAZOLAM HCL 2 MG/2ML IJ SOLN
1.0000 mg | INTRAMUSCULAR | Status: DC | PRN
  Administered 2015-05-08: 2 mg via INTRAVENOUS

## 2015-05-08 MED ORDER — PROPOFOL 10 MG/ML IV BOLUS
INTRAVENOUS | Status: AC
  Filled 2015-05-08: qty 20

## 2015-05-08 MED ORDER — PROPOFOL 10 MG/ML IV BOLUS
INTRAVENOUS | Status: AC
  Filled 2015-05-08: qty 40

## 2015-05-08 MED ORDER — FENTANYL CITRATE (PF) 100 MCG/2ML IJ SOLN: 25.0000 ug | INTRAMUSCULAR | Status: DC | PRN

## 2015-05-08 MED ORDER — LACTATED RINGERS IV SOLN
INTRAVENOUS | Status: DC
  Administered 2015-05-08: 11:00:00 via INTRAVENOUS

## 2015-05-08 MED ORDER — LIDOCAINE VISCOUS 2 % MT SOLN
15.0000 mL | Freq: Once | OROMUCOSAL | Status: AC
  Administered 2015-05-08: 6 mL via OROMUCOSAL

## 2015-05-08 MED ORDER — PROPOFOL 500 MG/50ML IV EMUL
INTRAVENOUS | Status: DC | PRN
  Administered 2015-05-08: 125 ug/kg/min via INTRAVENOUS

## 2015-05-08 MED ORDER — STERILE WATER FOR IRRIGATION IR SOLN
Status: DC | PRN
  Administered 2015-05-08: 11:00:00

## 2015-05-08 MED ORDER — FENTANYL CITRATE (PF) 100 MCG/2ML IJ SOLN
25.0000 ug | INTRAMUSCULAR | Status: AC
  Administered 2015-05-08 (×2): 25 ug via INTRAVENOUS

## 2015-05-08 MED ORDER — MIDAZOLAM HCL 2 MG/2ML IJ SOLN
INTRAMUSCULAR | Status: AC
  Filled 2015-05-08: qty 2

## 2015-05-08 MED ORDER — ONDANSETRON HCL 4 MG/2ML IJ SOLN: 4.0000 mg | Freq: Once | INTRAMUSCULAR | Status: DC | PRN

## 2015-05-08 NOTE — Discharge Instructions (Signed)
You had 2 polyps removed. YOU HAVE INTERNAL and external hemorrhoids.    YOU SHOULD SEE A SURGEON TO HAVE YOUR HEMORRHOIDS FIXED.  DRINK WATER TO KEEP YOUR URINE LIGHT YELLOW.  FOLLOW A HIGH FIBER/low fat DIET. AVOID ITEMS THAT CAUSE BLOATING. SEE INFO BELOW.  TO TREAT CONSTIPATION, ADD LINZESS 30 MINS PRIOR TO BREAKFAST. IT MAY CAUSE EXPLOSIVE DIARRHEA.  YOUR BIOPSY RESULTS WILL BE AVAILABLE IN MY CHART MAR 17 AND MY OFFICE WILL CONTACT YOU IN 10-14 DAYS WITH YOUR RESULTS.   Follow up in 4 mos.  Next colonoscopy in 5-10 years.    ENDOSCOPY Care After Read the instructions outlined below and refer to this sheet in the next week. These discharge instructions provide you with general information on caring for yourself after you leave the hospital. While your treatment has been planned according to the most current medical practices available, unavoidable complications occasionally occur. If you have any problems or questions after discharge, call DR. Katena Petitjean, [phone].  ACTIVITY  You may resume your regular activity, but move at a slower pace for the next 24 hours.   Take frequent rest periods for the next 24 hours.   Walking will help get rid of the air and reduce the bloated feeling in your belly (abdomen).   No driving for 24 hours (because of the medicine (anesthesia) used during the test).   You may shower.   Do not sign any important legal documents or operate any machinery for 24 hours (because of the anesthesia used during the test).    NUTRITION  Drink plenty of fluids.   You may resume your normal diet as instructed by your doctor.   Begin with a light meal and progress to your normal diet. Heavy or fried foods are harder to digest and may make you feel sick to your stomach (nauseated).   Avoid alcoholic beverages for 24 hours or as instructed.    MEDICATIONS  You may resume your normal medications.   WHAT YOU CAN EXPECT TODAY  Some feelings of  bloating in the abdomen.   Passage of more gas than usual.   Spotting of blood in your stool or on the toilet paper  .  IF YOU HAD POLYPS REMOVED DURING THE ENDOSCOPY:  Eat a soft diet IF YOU HAVE NAUSEA, BLOATING, ABDOMINAL PAIN, OR VOMITING.    FINDING OUT THE RESULTS OF YOUR TEST Not all test results are available during your visit. DR. Oneida Alar WILL CALL YOU WITHIN 14 DAYS OF YOUR PROCEDUE WITH YOUR RESULTS. Do not assume everything is normal if you have not heard from DR. Phiona Ramnauth, CALL HER OFFICE AT [phone].  SEEK IMMEDIATE MEDICAL ATTENTION AND CALL THE OFFICE: [phone] IF:  You have more than a spotting of blood in your stool.   Your belly is swollen (abdominal distention).   You are nauseated or vomiting.   You have a temperature over 101F.   You have abdominal pain or discomfort that is severe or gets worse throughout the day.   Hemorrhoids Hemorrhoids are dilated (enlarged) veins around the rectum. Sometimes clots will form in the veins. This makes them swollen and painful. These are called thrombosed hemorrhoids. Causes of hemorrhoids include:  Constipation.   Straining to have a bowel movement.   HEAVY LIFTING  HOME CARE INSTRUCTIONS  Eat a well balanced diet and drink 6 to 8 glasses of water every day to avoid constipation. You may also use a bulk laxative.   Avoid straining to have bowel  movements.   Keep anal area dry and clean.   Do not use a donut shaped pillow or sit on the toilet for long periods. This increases blood pooling and pain.   Move your bowels when your body has the urge; this will require less straining and will decrease pain and pressure.   Low-Fat Diet BREADS, CEREALS, PASTA, RICE, DRIED PEAS, AND BEANS These products are high in carbohydrates and most are low in fat. Therefore, they can be increased in the diet as substitutes for fatty foods. They too, however, contain calories and should not be eaten in excess. Cereals can be  eaten for snacks as well as for breakfast.   FRUITS AND VEGETABLES It is good to eat fruits and vegetables. Besides being sources of fiber, both are rich in vitamins and some minerals. They help you get the daily allowances of these nutrients. Fruits and vegetables can be used for snacks and desserts.  MEATS Limit lean meat, chicken, [place], and fish to no more than 6 ounces per day. Beef, Pork, and Lamb Use lean cuts of beef, pork, and lamb. Lean cuts include:  Extra-lean ground beef.  Arm roast.  Sirloin tip.  Center-cut ham.  Round steak.  Loin chops.  Rump roast.  Tenderloin.  Trim all fat off the outside of meats before cooking. It is not necessary to severely decrease the intake of red meat, but lean choices should be made. Lean meat is rich in protein and contains a highly absorbable form of iron. Premenopausal women, in particular, should avoid reducing lean red meat because this could increase the risk for low red blood cells (iron-deficiency anemia).  Chicken and [place] These are good sources of protein. The fat of poultry can be reduced by removing the skin and underlying fat layers before cooking. Chicken and [place] can be substituted for lean red meat in the diet. Poultry should not be fried or covered with high-fat sauces. Fish and Shellfish Fish is a good source of protein. Shellfish contain cholesterol, but they usually are low in saturated fatty acids. The preparation of fish is important. Like chicken and [place], they should not be fried or covered with high-fat sauces. EGGS Egg whites contain no fat or cholesterol. They can be eaten often. Try 1 to 2 egg whites instead of whole eggs in recipes or use egg substitutes that do not contain yolk. MILK AND DAIRY PRODUCTS Use skim or 1% milk instead of 2% or whole milk. Decrease whole milk, natural, and processed cheeses. Use nonfat or low-fat (2%) cottage cheese or low-fat cheeses made from vegetable oils. Choose nonfat or  low-fat (1 to 2%) yogurt. Experiment with evaporated skim milk in recipes that call for heavy cream. Substitute low-fat yogurt or low-fat cottage cheese for sour cream in dips and salad dressings. Have at least 2 servings of low-fat dairy products, such as 2 glasses of skim (or 1%) milk each day to help get your daily calcium intake. FATS AND OILS Reduce the total intake of fats, especially saturated fat. Butterfat, lard, and beef fats are high in saturated fat and cholesterol. These should be avoided as much as possible. Vegetable fats do not contain cholesterol, but certain vegetable fats, such as coconut oil, palm oil, and palm kernel oil are very high in saturated fats. These should be limited. These fats are often used in bakery goods, processed foods, popcorn, oils, and nondairy creamers. Vegetable shortenings and some peanut butters contain hydrogenated oils, which are also saturated fats. Read  the labels on these foods and check for saturated vegetable oils. Unsaturated vegetable oils and fats do not raise blood cholesterol. However, they should be limited because they are fats and are high in calories. Total fat should still be limited to 30% of your daily caloric intake. Desirable liquid vegetable oils are corn oil, cottonseed oil, olive oil, canola oil, safflower oil, soybean oil, and sunflower oil. Peanut oil is not as good, but small amounts are acceptable. Buy a heart-healthy tub margarine that has no partially hydrogenated oils in the ingredients. Mayonnaise and salad dressings often are made from unsaturated fats, but they should also be limited because of their high calorie and fat content. Seeds, nuts, peanut butter, olives, and avocados are high in fat, but the fat is mainly the unsaturated type. These foods should be limited mainly to avoid excess calories and fat. OTHER EATING TIPS Snacks  Most sweets should be limited as snacks. They tend to be rich in calories and fats, and their  caloric content outweighs their nutritional value. Some good choices in snacks are graham crackers, melba toast, soda crackers, bagels (no egg), English muffins, fruits, and vegetables. These snacks are preferable to snack crackers, [place] fries, TORTILLA CHIPS, and POTATO chips. Popcorn should be air-popped or cooked in small amounts of liquid vegetable oil. Desserts Eat fruit, low-fat yogurt, and fruit ices instead of pastries, cake, and cookies. Sherbet, angel food cake, gelatin dessert, frozen low-fat yogurt, or other frozen products that do not contain saturated fat (pure fruit juice bars, frozen ice pops) are also acceptable.  COOKING METHODS Choose those methods that use little or no fat. They include: Poaching.  Braising.  Steaming.  Grilling.  Baking.  Stir-frying.  Broiling.  Microwaving.  Foods can be cooked in a nonstick pan without added fat, or use a nonfat cooking spray in regular cookware. Limit fried foods and avoid frying in saturated fat. Add moisture to lean meats by using water, broth, cooking wines, and other nonfat or low-fat sauces along with the cooking methods mentioned above. Soups and stews should be chilled after cooking. The fat that forms on top after a few hours in the refrigerator should be skimmed off. When preparing meals, avoid using excess salt. Salt can contribute to raising blood pressure in some people.  EATING AWAY FROM HOME Order entres, potatoes, and vegetables without sauces or butter. When meat exceeds the size of a deck of cards (3 to 4 ounces), the rest can be taken home for another meal. Choose vegetable or fruit salads and ask for low-calorie salad dressings to be served on the side. Use dressings sparingly. Limit high-fat toppings, such as bacon, crumbled eggs, cheese, sunflower seeds, and olives. Ask for heart-healthy tub margarine instead of butter.  High-Fiber Diet A high-fiber diet changes your normal diet to include more whole grains,  legumes, fruits, and vegetables. Changes in the diet involve replacing refined carbohydrates with unrefined foods. The calorie level of the diet is essentially unchanged. The Dietary Reference Intake (recommended amount) for adult males is 38 grams per day. For adult females, it is 25 grams per day. Pregnant and lactating women should consume 28 grams of fiber per day. Fiber is the intact part of a plant that is not broken down during digestion. Functional fiber is fiber that has been isolated from the plant to provide a beneficial effect in the body. PURPOSE  Increase stool bulk.   Ease and regulate bowel movements.   Lower cholesterol.  REDUCE RISK OF  COLON CANCER  INDICATIONS THAT YOU NEED MORE FIBER  Constipation and hemorrhoids.   Uncomplicated diverticulosis (intestine condition) and irritable bowel syndrome.   Weight management.   As a protective measure against hardening of the arteries (atherosclerosis), diabetes, and cancer.   GUIDELINES FOR INCREASING FIBER IN THE DIET  Start adding fiber to the diet slowly. A gradual increase of about 5 more grams (2 slices of whole-wheat bread, 2 servings of most fruits or vegetables, or 1 bowl of high-fiber cereal) per day is best. Too rapid an increase in fiber may result in constipation, flatulence, and bloating.   Drink enough water and fluids to keep your urine clear or pale yellow. Water, juice, or caffeine-free drinks are recommended. Not drinking enough fluid may cause constipation.   Eat a variety of high-fiber foods rather than one type of fiber.   Try to increase your intake of fiber through using high-fiber foods rather than fiber pills or supplements that contain small amounts of fiber.   The goal is to change the types of food eaten. Do not supplement your present diet with high-fiber foods, but replace foods in your present diet.   INCLUDE A VARIETY OF FIBER SOURCES  Replace refined and processed grains with whole  grains, canned fruits with fresh fruits, and incorporate other fiber sources. White rice, white breads, and most bakery goods contain little or no fiber.   Brown whole-grain rice, buckwheat oats, and many fruits and vegetables are all good sources of fiber. These include: broccoli, Brussels sprouts, cabbage, cauliflower, beets, sweet potatoes, white potatoes (skin on), carrots, tomatoes, eggplant, squash, berries, fresh fruits, and dried fruits.   Cereals appear to be the richest source of fiber. Cereal fiber is found in whole grains and bran. Bran is the fiber-rich outer coat of cereal grain, which is largely removed in refining. In whole-grain cereals, the bran remains. In breakfast cereals, the largest amount of fiber is found in those with "bran" in their names. The fiber content is sometimes indicated on the label.   You may need to include additional fruits and vegetables each day.   In baking, for 1 cup white flour, you may use the following substitutions:   1 cup whole-wheat flour minus 2 tablespoons.   1/2 cup white flour plus 1/2 cup whole-wheat flour.    Polyps, Colon  A polyp is extra tissue that grows inside your body. Colon polyps grow in the large intestine. The large intestine, also called the colon, is part of your digestive system. It is a long, hollow tube at the end of your digestive tract where your body makes and stores stool. Most polyps are not dangerous. They are benign. This means they are not cancerous. But over time, some types of polyps can turn into cancer. Polyps that are smaller than a pea are usually not harmful. But larger polyps could someday become or may already be cancerous. To be safe, doctors remove all polyps and test them.   PREVENTION There is not one sure way to prevent polyps. You might be able to lower your risk of getting them if you:  Eat more fruits and vegetables and less fatty food.   Do not smoke.   Avoid alcohol.   Exercise every day.     Lose weight if you are overweight.   Eating more calcium and folate can also lower your risk of getting polyps. Some foods that are rich in calcium are milk, cheese, and broccoli. Some foods that are rich  in folate are chickpeas, kidney beans, and spinach.

## 2015-05-08 NOTE — Progress Notes (Signed)
REVIEWED-NO ADDITIONAL RECOMMENDATIONS. 

## 2015-05-08 NOTE — Anesthesia Preprocedure Evaluation (Signed)
Anesthesia Evaluation  Patient identified by MRN, date of birth, ID band Patient awake    Reviewed: Allergy & Precautions, NPO status , Patient's Chart, lab work & pertinent test results  Airway Mallampati: II  TM Distance: >3 FB Neck ROM: Full    Dental no notable dental hx.    Pulmonary neg pulmonary ROS, former smoker,    Pulmonary exam normal breath sounds clear to auscultation       Cardiovascular hypertension, + CAD, + Past MI, + Peripheral Vascular Disease, +CHF and + DVT  + Valvular Problems/Murmurs MR  Rhythm:Regular Rate:Normal + Systolic murmurs  Hx of echocardiogram 2014 EF 40%     Neuro/Psych PSYCHIATRIC DISORDERS Depression Schizophrenia negative neurological ROS     GI/Hepatic negative GI ROS, Neg liver ROS, GERD  ,  Endo/Other  negative endocrine ROS  Renal/GU Renal InsufficiencyRenal disease  negative genitourinary   Musculoskeletal negative musculoskeletal ROS (+)   Abdominal   Peds negative pediatric ROS (+)  Hematology negative hematology ROS (+)   Anesthesia Other Findings   Reproductive/Obstetrics negative OB ROS                             Anesthesia Physical Anesthesia Plan  ASA: III  Anesthesia Plan: MAC   Post-op Pain Management:    Induction: Intravenous  Airway Management Planned: Simple Face Mask  Additional Equipment:   Intra-op Plan:   Post-operative Plan:   Informed Consent: I have reviewed the patients History and Physical, chart, labs and discussed the procedure including the risks, benefits and alternatives for the proposed anesthesia with the patient or authorized representative who has indicated his/her understanding and acceptance.   Dental advisory given  Plan Discussed with: CRNA and Surgeon  Anesthesia Plan Comments:         Anesthesia Quick Evaluation

## 2015-05-08 NOTE — Op Note (Signed)
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 TCS SEP 2016-TWO                            POLYPS IN COLON NOT REMOVED. LARGE INTERNAL                            HEMORRHOIDS. Providers:                Barney Drain, MD, Rosina Lowenstein, RN, Georgeann Oppenheim,                            Technician Referring MD:             Glenda Chroman (Referring MD) Medicines:                Propofol per Anesthesia Complications:            No immediate complications. Estimated Blood Loss:     Estimated blood loss: none. Procedure:                Pre-Anesthesia Assessment:                           - Prior to the procedure, a History and Physical                            was performed, and patient medications and                            allergies were reviewed. The patient's tolerance of                            previous anesthesia was also reviewed. The risks                            and benefits of the procedure and the sedation                            options and risks were discussed with the patient.                            All questions were answered, and informed consent                            was obtained. Anticoagulants: The patient has taken  aspirin. It was decided not to withhold this                            medication prior to the procedure. ASA Grade                            Assessment: II - A patient with mild systemic                            disease. After reviewing the risks and benefits,                            the patient was deemed in satisfactory condition to                            undergo the  procedure.                           After obtaining informed consent, the endoscope was                            passed under direct vision. Throughout the                            procedure, the patient's blood pressure, pulse, and                            oxygen saturations were monitored continuously. The                            [id number]) scope was introduced through the                            mouth, and advanced to the second part of duodenum.                            The upper GI endoscopy was accomplished without                            difficulty. The patient tolerated the procedure                            well. Scope In: 12:27:05 PM Scope Out: 12:32:26 PM Total Procedure Duration: 0 hours 5 minutes 21 seconds  Findings:      LA Grade A (one or more mucosal breaks less than 5 mm, not extending       between tops of 2 mucosal folds) esophagitis was found at the       gastroesophageal junction.      The entire examined stomach was normal.      The duodenal bulb and second portion of the duodenum were normal. Impression:               - LA Grade A reflux esophagitis.                           -  Normal stomach.                           - Normal duodenal bulb and second portion of the                            duodenum.                           - No specimens collected. Moderate Sedation:      Moderate (conscious) sedation was personally administered by an       anesthesia professional. The following parameters were monitored: oxygen       saturation, heart rate, blood pressure, and response to care. Recommendation:           - Patient has a contact number available for                            emergencies. The signs and symptoms of potential                            delayed complications were discussed with the                            patient. Return to normal activities tomorrow.                            Written discharge instructions  were provided to the                            patient.                           - High fiber/LOW FAT diet.                           - Medication reconciliation was performed, and a                            list of the patient's discharge medications was                            provided to the patient.                           - Await pathology results.                           SEE SURGERY TO DISCUSS BENFITS V. RISKS OF                            HEMORRHOIDECTOMY.                           DRINK WATER TO KEEP URINE LIGHT YELLOW.  TO TREAT CONSTIPATION, ADD LINZESS 30 MINS PRIOR TO                            BREAKFAST. IT MAY CAUSE EXPLOSIVE DIARRHEA.                           Follow up in 4 mos.                           Next colonoscopy in 5-10 years. Procedure Code(s):        --- Professional ---                           [phone], Esophagogastroduodenoscopy, flexible,                            transoral; diagnostic, including collection of                            specimen(s) by brushing or washing, when performed                            (separate procedure) Diagnosis Code(s):        --- Professional ---                           K21.0, Gastro-esophageal reflux disease with                            esophagitis                           D50.0, Iron deficiency anemia secondary to blood                            loss (chronic) CPT copyright 2016 [employer]. All rights reserved. The codes documented in this report are preliminary and upon coder review may  be revised to meet current compliance requirements. Barney Drain, MD Barney Drain, MD 05/08/2015 2:44:29 PM This report has been signed electronically. Number of Addenda: 0

## 2015-05-08 NOTE — H&P (Signed)
Primary Care Physician:  Glenda Chroman., MD Primary Gastroenterologist:  Dr. Oneida Alar  Pre-Procedure History & Physical: HPI:  Glenn Hawkins is a 60 y.o. male here for  RECTAL BLEEDING.  Past Medical History  Diagnosis Date  . Acute myocardial infarction, subendocardial infarction, initial episode of care ([name])   . Other primary cardiomyopathies   . Esophageal reflux   . Depressive disorder, not elsewhere classified   . Gout, unspecified   . Unspecified schizophrenia, unspecified condition   . Encounter for long-term (current) use of other medications   . Personal history of tobacco use, presenting hazards to health   . Hypotension, unspecified   . Mitral valve disorders     moderate MR  . Coronary atherosclerosis of native coronary artery   . Hypertension   . Other and unspecified hyperlipidemia   . Torn ligament   . Ischemic cardiomyopathy   . Hx of echocardiogram 2014    EF 40%  . CKD (chronic kidney disease) stage 3, GFR 30-59 ml/min     baseline Cr around 2.5  . 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 CHF (congestive heart failure) ([place])   . History of DVT (deep vein thrombosis)     Past Surgical History  Procedure Laterality Date  . None    . Colonoscopy    . Eye surgery    . Colonoscopy N/A 11/10/2014    Procedure: COLONOSCOPY;  Surgeon: Manus Gunning, MD;  Location: [facility];  Service: Gastroenterology;  Laterality: N/A;    Prior to Admission medications   Medication Sig Start Date End Date Taking? Authorizing Provider  amLODipine (NORVASC) 10 MG tablet Take 10 mg by mouth daily.   Yes Historical Provider, MD  ARIPiprazole (ABILIFY) 20 MG tablet Take 20 mg by mouth 2 (two) times daily.   Yes Historical Provider, MD  buPROPion (WELLBUTRIN) 100 MG tablet Take 200 mg by mouth 2 (two) times daily.    Yes Historical Provider, MD  Cholecalciferol 1000 UNITS capsule  Take 2,000 Units by mouth daily.   Yes Historical Provider, MD  docusate sodium (COLACE) 100 MG capsule Take 1 capsule (100 mg total) by mouth 2 (two) times daily. 11/10/14  Yes Donne Hazel, MD  hydrALAZINE (APRESOLINE) 100 MG tablet Take 100 mg by mouth 2 (two) times daily.   Yes Historical Provider, MD  HYDROcodone-acetaminophen (NORCO/VICODIN) 5-325 MG per tablet Take 1 tablet by mouth every 6 (six) hours as needed for severe pain. Reported on 04/18/2015 07/21/14  Yes Historical Provider, MD  hydrocortisone (ANUSOL-HC) 25 MG suppository Insert one rectally daily 11/16/14  Yes Manus Gunning, MD  isosorbide mononitrate (IMDUR) 60 MG 24 hr tablet Take 1 tablet (60 mg total) by mouth every morning. 08/25/14  Yes Wellington Hampshire, MD  latanoprost (XALATAN) 0.005 % ophthalmic solution Place 1 drop into both eyes at bedtime.    Yes Historical Provider, MD  LORazepam (ATIVAN) 1 MG tablet Take 2 mg by mouth at bedtime.    Yes Historical Provider, MD  metoprolol (LOPRESSOR) 100 MG tablet Take 200 mg by mouth 2 (two) times daily.    Yes Historical Provider, MD  pantoprazole (PROTONIX) 40 MG tablet Take 40 mg by mouth daily.   Yes Historical Provider, MD  polyethylene glycol (MIRALAX) packet Take 17 g by mouth daily. 11/10/14  Yes Donne Hazel, MD  polyethylene glycol-electrolytes (NULYTELY/GOLYTELY) 420 g solution Take 4,000 mLs  by mouth once. 04/18/15  Yes Carlis Stable, NP  QUEtiapine (SEROQUEL) 400 MG tablet Take 400 mg by mouth at bedtime.   Yes Historical Provider, MD  ranitidine (ZANTAC) 150 MG tablet Take 150 mg by mouth 2 (two) times daily as needed for heartburn. Reported on 04/18/2015   Yes Historical Provider, MD  rivaroxaban (XARELTO) 20 MG TABS tablet Take 20 mg by mouth daily with supper. Reported on 04/18/2015   Yes Historical Provider, MD  sertraline (ZOLOFT) 100 MG tablet Take 150 mg by mouth daily.    Yes Historical Provider, MD  sildenafil (VIAGRA) 50 MG tablet Take 50 mg by mouth daily  as needed for erectile dysfunction.   Yes Historical Provider, MD  simvastatin (ZOCOR) 40 MG tablet Take 1 tablet (40 mg total) by mouth at bedtime. 04/12/15  Yes Wellington Hampshire, MD  spironolactone (ALDACTONE) 25 MG tablet Take 50 mg by mouth at bedtime.    Yes Historical Provider, MD    Allergies as of 04/18/2015 - Review Complete 04/18/2015  Allergen Reaction Noted  . Nifedipine Diarrhea 12/08/2013  . Benazepril Other (See Comments) 03/07/2011  . Haloperidol and related Other (See Comments) 03/07/2011  . Omeprazole Other (See Comments) 03/07/2011    Family History  Problem Relation Age of Onset  . Colon cancer Neg Hx     Social History   Social History  . Marital Status: Divorced    Spouse Name: N/A  . Number of Children: N/A  . Years of Education: N/A   Occupational History  . Not on file.   Social History Main Topics  . Smoking status: Former Smoker -- 1.00 packs/day for 23 years    Types: Cigarettes    Quit date: 02/25/1996  . Smokeless tobacco: Never Used     Comment: + 15 years of smoking  . Alcohol Use: No  . Drug Use: No  . Sexual Activity: Not on file   Other Topics Concern  . Not on file   Social History Narrative    Review of Systems: See HPI, otherwise negative ROS   Physical Exam: Temp(Src) 98.4 F (36.9 C) (Oral) General:   Alert,  pleasant and cooperative in NAD Head:  Normocephalic and atraumatic. Neck:  Supple; Lungs:  Clear throughout to auscultation.    Heart:  Regular rate and rhythm. Abdomen:  Soft, nontender and nondistended. Normal bowel sounds, without guarding, and without rebound.   Neurologic:  Alert and  oriented x4;  grossly normal neurologically.  Impression/Plan:    RECTAL BLEEDING  PLAN:  1. TCS/? Hemorrhoid banding TODAY

## 2015-05-08 NOTE — Op Note (Signed)
[facility] Patient Name: Glenn Hawkins Procedure Date: 05/08/2015 11:45 AM MRN: [id number] Date of Birth: [birth year] Attending MD: Barney Drain , MD CSN: [id number] Age: 60 Admit Type: Outpatient Procedure:                Colonoscopy Indications:              Hematochezia on XARELTO. HELD FOR 1 WEEK DUE TO                            TRANSFUSION DEPENDENT ANEMIA/RECTAL BLEEDING. Providers:                Barney Drain, MD, Rosina Lowenstein, RN, Georgeann Oppenheim,                            Technician Referring MD:             Glenda Chroman (Referring MD) Medicines:                Propofol per Anesthesia Complications:            No immediate complications. Estimated Blood Loss:     Estimated blood loss: none. Procedure:                Pre-Anesthesia Assessment:                           - Prior to the procedure, a History and Physical                            was performed, and patient medications and                            allergies were reviewed. The patient's tolerance of                            previous anesthesia was also reviewed. The risks                            and benefits of the procedure and the sedation                            options and risks were discussed with the patient.                            All questions were answered, and informed consent                            was obtained. Anticoagulants: The patient has taken                            aspirin. It was decided not to withhold this                            medication prior to the procedure. ASA Grade  Assessment: II - A patient with mild systemic                            disease. After reviewing the risks and benefits,                            the patient was deemed in satisfactory condition to                            undergo the procedure. After obtaining informed                            consent, the colonoscope was passed under direct          vision. Throughout the procedure, the patient's                            blood pressure, pulse, and oxygen saturations were                            monitored continuously. The [id number]                            scope was introduced through the anus and advanced                            to the the cecum, identified by appendiceal orifice                            and ileocecal valve. The colonoscopy was performed                            without difficulty. The patient tolerated the                            procedure well. The quality of the bowel                            preparation was good. The ileocecal valve,                            appendiceal orifice, and rectum were photographed. Scope In: 11:59:14 AM Scope Out: 12:20:13 PM Scope Withdrawal Time: 0 hours 16 minutes 17 seconds  Total Procedure Duration: 0 hours 20 minutes 59 seconds  Findings:      The digital rectal exam findings include non-thrombosed external       hemorrhoids. Pertinent negatives include normal sphincter tone.      A 8 mm polyp was found in the proximal transverse colon. The polyp was       sessile. The polyp was removed with a hot snare. Resection and retrieval       were complete.      A 4 mm polyp was found in the proximal descending colon. The polyp was       sessile. The polyp was removed with a cold snare. Resection and  retrieval were complete.      External and internal hemorrhoids were found during retroflexion. The       hemorrhoids were large. Impression:               - Non-thrombosed external hemorrhoids found on                            digital rectal exam.                           - One 8 mm polyp in the proximal transverse colon,                            removed with a hot snare. Resected and retrieved.                           - One 4 mm polyp in the proximal descending colon,                            removed with a cold snare. Resected and  retrieved.                           - External and internal hemorrhoids. Moderate Sedation:      Moderate (conscious) sedation was personally administered by an       anesthesia professional. The following parameters were monitored: oxygen       saturation, heart rate, blood pressure, and response to care. Recommendation:           - Patient has a contact number available for                            emergencies. The signs and symptoms of potential                            delayed complications were discussed with the                            patient. Return to normal activities tomorrow.                            Written discharge instructions were provided to the                            patient.                           - High fiber diet.                           - Medication reconciliation was performed, and a                            list of the patient's discharge medications was  provided to the patient.                           - Await pathology results.                           - Repeat colonoscopy in 5-10 years for surveillance                            based on pathology results.                           SEE A SURGEON to discuss benefits v. risks of                            hemorrhoid surgery.                           DRINK WATER TO KEEP YOUR URINE LIGHT YELLOW.                           TO TREAT CONSTIPATION, ADD LINZESS 30 MINS PRIOR TO                            BREAKFAST. IT MAY CAUSE EXPLOSIVE DIARRHEA.                           Follow up in 4 mos. Procedure Code(s):        --- Professional ---                           [phone], Colonoscopy, flexible; with removal of                            tumor(s), polyp(s), or other lesion(s) by snare                            technique Diagnosis Code(s):        --- Professional ---                           D12.3, Benign neoplasm of transverse colon (hepatic                             flexure or splenic flexure)                           D12.4, Benign neoplasm of descending colon                           K64.4, Residual hemorrhoidal skin tags                           K64.8, Other hemorrhoids                           K92.1, Melena (includes  Hematochezia) CPT copyright 2016 [employer]. All rights reserved. The codes documented in this report are preliminary and upon coder review may  be revised to meet current compliance requirements. Barney Drain, MD Barney Drain, MD 05/08/2015 2:55:18 PM This report has been signed electronically. Number of Addenda: 0

## 2015-05-08 NOTE — Transfer of Care (Signed)
Immediate Anesthesia Transfer of Care Note  Patient: Glenn Hawkins  Procedure(s) Performed: Procedure(s) with comments: COLONOSCOPY WITH PROPOFOL (N/A) - 1215 - office states unable to move patient ESOPHAGOGASTRODUODENOSCOPY (EGD) WITH PROPOFOL (N/A) POLYPECTOMY - transverse colon polyp  Patient Location: PACU  Anesthesia Type:MAC  Level of Consciousness: awake  Airway & Oxygen Therapy: Patient Spontanous Breathing  Post-op Assessment: Report given to RN  Post vital signs: Reviewed and stable  Last Vitals:  Filed Vitals:   05/08/15 1130 05/08/15 1135  BP: 141/90 147/87  Temp:    Resp: 16 29    Complications: No apparent anesthesia complications

## 2015-05-09 NOTE — Anesthesia Postprocedure Evaluation (Signed)
Anesthesia Post Note  Patient: Glenn Hawkins  Procedure(s) Performed: Procedure(s) (LRB): COLONOSCOPY WITH PROPOFOL (N/A) ESOPHAGOGASTRODUODENOSCOPY (EGD) WITH PROPOFOL (N/A) POLYPECTOMY  Patient location during evaluation: PACU Anesthesia Type: MAC Level of consciousness: awake and alert, oriented and lethargic Pain management: pain level controlled Vital Signs Assessment: post-procedure vital signs reviewed and stable Respiratory status: spontaneous breathing and respiratory function stable Cardiovascular status: blood pressure returned to baseline Postop Assessment: no signs of nausea or vomiting Anesthetic complications: no    Last Vitals:  Filed Vitals:   05/08/15 1315 05/08/15 1321  BP: 141/91 130/76  Pulse: 68 70  Temp:  36.4 C  Resp: 20 18    Last Pain: There were no vitals filed for this visit.               Nivedita Mirabella J

## 2015-05-11 DIAGNOSIS — E7800 Pure hypercholesterolemia, unspecified: Secondary | ICD-10-CM | POA: Diagnosis not present

## 2015-05-11 DIAGNOSIS — I10 Essential (primary) hypertension: Secondary | ICD-10-CM | POA: Diagnosis not present

## 2015-05-14 ENCOUNTER — Encounter (HOSPITAL_COMMUNITY): Payer: Self-pay | Admitting: Gastroenterology

## 2015-05-15 ENCOUNTER — Other Ambulatory Visit: Payer: Self-pay

## 2015-05-15 ENCOUNTER — Telehealth: Payer: Self-pay | Admitting: Gastroenterology

## 2015-05-15 DIAGNOSIS — K649 Unspecified hemorrhoids: Secondary | ICD-10-CM

## 2015-05-15 NOTE — Telephone Encounter (Signed)
LMOM for a return call.  

## 2015-05-15 NOTE — Telephone Encounter (Signed)
Please call pt. 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    HE SEE A DR, [facility] AVAILABLE APPT TO HAVE  HIS HEMORRHOIDS FIXED.  DRINK WATER TO KEEP YOUR URINE LIGHT YELLOW.  FOLLOW A HIGH FIBER/low fat DIET. AVOID ITEMS THAT CAUSE BLOATING.  TO TREAT CONSTIPATION, ADD LINZESS 30 MINS PRIOR TO BREAKFAST. IT MAY CAUSE EXPLOSIVE DIARRHEA.  Follow up in 4 mos E30 HEMORRHOIDS/CONSTIPATION.  NEXT TCS IN 5-10 YEARS.

## 2015-05-15 NOTE — Telephone Encounter (Signed)
Pt's sister returned call and I asked her to have pt call and she said he will call right back.

## 2015-05-15 NOTE — Telephone Encounter (Signed)
Pt called back and is aware the referral will be made to Dr. Arnoldo Morale.

## 2015-05-15 NOTE — Telephone Encounter (Signed)
Referral faxed along with clinicals

## 2015-05-15 NOTE — Telephone Encounter (Signed)
Pt called today saying on his discharge papers he is to follow up with [facility] in 2 weeks and he can not wait that long and wants to know when he can schedule his procedure. All I can see is he needs a follow up in 4 months and next colonoscopy in 5-10 years.  Can you clarify when patient needs to come back and what procedure he is talking about. [phone]

## 2015-05-16 NOTE — Telephone Encounter (Signed)
Reminder in epic and OV made °

## 2015-05-21 ENCOUNTER — Telehealth: Payer: Self-pay | Admitting: Gastroenterology

## 2015-05-29 DIAGNOSIS — N189 Chronic kidney disease, unspecified: Secondary | ICD-10-CM | POA: Diagnosis not present

## 2015-05-29 DIAGNOSIS — I5022 Chronic systolic (congestive) heart failure: Secondary | ICD-10-CM | POA: Diagnosis not present

## 2015-05-29 DIAGNOSIS — M109 Gout, unspecified: Secondary | ICD-10-CM | POA: Diagnosis not present

## 2015-05-31 DIAGNOSIS — K648 Other hemorrhoids: Secondary | ICD-10-CM | POA: Diagnosis not present

## 2015-06-15 DIAGNOSIS — I10 Essential (primary) hypertension: Secondary | ICD-10-CM | POA: Diagnosis not present

## 2015-06-15 DIAGNOSIS — E7800 Pure hypercholesterolemia, unspecified: Secondary | ICD-10-CM | POA: Diagnosis not present

## 2015-06-26 DIAGNOSIS — K648 Other hemorrhoids: Secondary | ICD-10-CM | POA: Diagnosis not present

## 2015-06-29 DIAGNOSIS — I10 Essential (primary) hypertension: Secondary | ICD-10-CM | POA: Diagnosis not present

## 2015-06-29 DIAGNOSIS — E7800 Pure hypercholesterolemia, unspecified: Secondary | ICD-10-CM | POA: Diagnosis not present

## 2015-07-02 DIAGNOSIS — M79605 Pain in left leg: Secondary | ICD-10-CM | POA: Diagnosis not present

## 2015-07-02 DIAGNOSIS — I5022 Chronic systolic (congestive) heart failure: Secondary | ICD-10-CM | POA: Diagnosis not present

## 2015-07-02 DIAGNOSIS — I739 Peripheral vascular disease, unspecified: Secondary | ICD-10-CM | POA: Diagnosis not present

## 2015-07-02 DIAGNOSIS — M79604 Pain in right leg: Secondary | ICD-10-CM | POA: Diagnosis not present

## 2015-07-09 ENCOUNTER — Ambulatory Visit: Payer: Medicare Other | Admitting: Cardiovascular Disease

## 2015-07-09 DIAGNOSIS — I739 Peripheral vascular disease, unspecified: Secondary | ICD-10-CM | POA: Diagnosis not present

## 2015-07-09 DIAGNOSIS — I70211 Atherosclerosis of native arteries of extremities with intermittent claudication, right leg: Secondary | ICD-10-CM | POA: Diagnosis not present

## 2015-07-17 DIAGNOSIS — I739 Peripheral vascular disease, unspecified: Secondary | ICD-10-CM | POA: Diagnosis not present

## 2015-08-02 ENCOUNTER — Encounter: Payer: Self-pay | Admitting: Vascular Surgery

## 2015-08-02 DIAGNOSIS — I10 Essential (primary) hypertension: Secondary | ICD-10-CM | POA: Diagnosis not present

## 2015-08-02 DIAGNOSIS — E7800 Pure hypercholesterolemia, unspecified: Secondary | ICD-10-CM | POA: Diagnosis not present

## 2015-08-04 DIAGNOSIS — Z79899 Other long term (current) drug therapy: Secondary | ICD-10-CM | POA: Diagnosis not present

## 2015-08-04 DIAGNOSIS — J069 Acute upper respiratory infection, unspecified: Secondary | ICD-10-CM | POA: Diagnosis not present

## 2015-08-04 DIAGNOSIS — R05 Cough: Secondary | ICD-10-CM | POA: Diagnosis not present

## 2015-08-04 DIAGNOSIS — K219 Gastro-esophageal reflux disease without esophagitis: Secondary | ICD-10-CM | POA: Diagnosis not present

## 2015-08-04 DIAGNOSIS — I10 Essential (primary) hypertension: Secondary | ICD-10-CM | POA: Diagnosis not present

## 2015-08-06 ENCOUNTER — Ambulatory Visit: Payer: Medicare Other | Admitting: Cardiovascular Disease

## 2015-08-08 ENCOUNTER — Ambulatory Visit (INDEPENDENT_AMBULATORY_CARE_PROVIDER_SITE_OTHER): Payer: Medicare Other | Admitting: Vascular Surgery

## 2015-08-08 ENCOUNTER — Encounter: Payer: Self-pay | Admitting: Vascular Surgery

## 2015-08-08 VITALS — BP 122/70 | HR 78 | Temp 97.4°F | Resp 14 | Ht 68.0 in | Wt 218.0 lb

## 2015-08-08 DIAGNOSIS — I739 Peripheral vascular disease, unspecified: Secondary | ICD-10-CM | POA: Diagnosis not present

## 2015-08-08 NOTE — Progress Notes (Signed)
Referring Physician: Glenda Chroman, MD  Patient name: Glenn Hawkins MRN: [id number] DOB: 1955/03/10 Sex: male  REASON FOR CONSULT: Bilateral leg pain  HPI: Glenn Hawkins is a 60 y.o. male,  who complains of pain in both legs with walking. This has been going on for about 4-5 years. He states he gets pain from the thigh all the way down into the calf and both legs after walking about one block. This resolves after resting for a few minutes. It has been slowly progressive over the last few years. He denies rest pain or nonhealing ulcers. Other medical problems include history of DVT (on Xarelto), coronary artery disease, CK D3 which have all been stable. He is also on multiple psychiatric meds. He is a former smoker but quit many years ago.  Past Medical History  Diagnosis Date  . Acute myocardial infarction, subendocardial infarction, initial episode of care ([place])   . Other primary cardiomyopathies   . Esophageal reflux   . Depressive disorder, not elsewhere classified   . Gout, unspecified   . Unspecified schizophrenia, unspecified condition   . Encounter for long-term (current) use of other medications   . Personal history of tobacco use, presenting hazards to health   . Hypotension, unspecified   . Mitral valve disorders     moderate MR  . Coronary atherosclerosis of native coronary artery   . Hypertension   . Other and unspecified hyperlipidemia   . Torn ligament   . Ischemic cardiomyopathy   . Hx of echocardiogram 2014    EF 40%  . CKD (chronic kidney disease) stage 3, GFR 30-59 ml/min     baseline Cr around 2.5  . 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 CHF (congestive heart failure) ([place])   . History of DVT (deep vein thrombosis)    Past Surgical History  Procedure Laterality Date  . None    . Colonoscopy    . Eye surgery    . Colonoscopy N/A 11/10/2014    Procedure:  COLONOSCOPY;  Surgeon: Manus Gunning, MD;  Location: [facility];  Service: Gastroenterology;  Laterality: N/A;  . Colonoscopy with propofol N/A 05/08/2015    Procedure: COLONOSCOPY WITH PROPOFOL;  Surgeon: [name], MD;  [facility];  Service: Endoscopy;  Laterality: N/A;  1215 - office states unable to move patient  . Esophagogastroduodenoscopy (egd) with propofol N/A 05/08/2015    Procedure: ESOPHAGOGASTRODUODENOSCOPY (EGD) WITH PROPOFOL;  Surgeon: [name], MD;  [facility];  Service: Endoscopy;  Laterality: N/A;  . Polypectomy  05/08/2015    Procedure: POLYPECTOMY;  Surgeon: [name], MD;  [facility];  Service: Endoscopy;;  transverse colon polyp    Family History  Problem Relation Age of Onset  . Colon cancer Neg Hx     SOCIAL HISTORY: Social History   Social History  . Marital Status: Divorced    Spouse Name: N/A  . Number of Children: N/A  . Years of Education: N/A   Occupational History  . Not on file.   Social History Main Topics  . Smoking status: Former Smoker -- 1.00 packs/day for 23 years    Types: Cigarettes    Quit date: 02/25/1996  . Smokeless tobacco: Never Used     Comment: + 15 years of smoking  . Alcohol Use: No  . Drug Use: No  .  Sexual Activity: Not on file   Other Topics Concern  . Not on file   Social History Narrative    Allergies  Allergen Reactions  . Nifedipine Diarrhea  . Benazepril Other (See Comments)  . Haloperidol And Related Other (See Comments)  . Omeprazole Other (See Comments)    Current Outpatient Prescriptions  Medication Sig Dispense Refill  . amLODipine (NORVASC) 10 MG tablet Take 10 mg by mouth daily.    . ARIPiprazole (ABILIFY) 20 MG tablet Take 20 mg by mouth 2 (two) times daily.    [name] buPROPion (WELLBUTRIN) 100 MG tablet Take 200 mg by mouth 2 (two) times daily.     . Cholecalciferol 1000 UNITS capsule Take 2,000 Units by mouth daily.    [name] COLCRYS 0.6 MG  tablet     . docusate sodium (COLACE) 100 MG capsule Take 1 capsule (100 mg total) by mouth 2 (two) times daily. 10 capsule 0  . hydrALAZINE (APRESOLINE) 100 MG tablet Take 100 mg by mouth 2 (two) times daily.    [name] HYDROcodone-acetaminophen (NORCO/VICODIN) 5-325 MG per tablet Take 1 tablet by mouth every 6 (six) hours as needed for severe pain. Reported on 04/18/2015    . hydrocortisone (ANUSOL-HC) 25 MG suppository Insert one rectally daily 12 suppository 0  . isosorbide mononitrate (IMDUR) 60 MG 24 hr tablet Take 1 tablet (60 mg total) by mouth every morning. 90 tablet 4  . latanoprost (XALATAN) 0.005 % ophthalmic solution Place 1 drop into both eyes at bedtime.     . Linaclotide (LINZESS) 145 MCG CAPS capsule 1 PO 30 mins prior to your first meal 30 capsule 11  . LORazepam (ATIVAN) 1 MG tablet Take 2 mg by mouth at bedtime.     . metoprolol (LOPRESSOR) 100 MG tablet Take 100 mg by mouth 2 (two) times daily.     . pantoprazole (PROTONIX) 40 MG tablet Take 40 mg by mouth daily.    . polyethylene glycol (MIRALAX) packet Take 17 g by mouth daily. 14 each 0  . predniSONE (DELTASONE) 20 MG tablet take 2 tablets by mouth once daily with food  0  . QUEtiapine (SEROQUEL) 400 MG tablet Take 400 mg by mouth at bedtime.    . ranitidine (ZANTAC) 150 MG tablet Take 150 mg by mouth 2 (two) times daily as needed for heartburn. Reported on 04/18/2015    . sertraline (ZOLOFT) 100 MG tablet Take 150 mg by mouth daily.     . sildenafil (VIAGRA) 50 MG tablet Take 50 mg by mouth daily as needed for erectile dysfunction.    . simvastatin (ZOCOR) 40 MG tablet Take 1 tablet (40 mg total) by mouth at bedtime. 90 tablet 3  . spironolactone (ALDACTONE) 25 MG tablet Take 50 mg by mouth at bedtime.     Alveda Reasons 20 MG TABS tablet      No current facility-administered medications for this visit.    ROS:   General:  No weight loss, Fever, chills  HEENT: No recent headaches, no nasal bleeding, no visual changes, no sore  throat  Neurologic: No dizziness, blackouts, seizures. No recent symptoms of stroke or mini- stroke. No recent episodes of slurred speech, or temporary blindness.  Cardiac: No recent episodes of chest pain/pressure, no shortness of breath at rest.  + shortness of breath with exertion.  Denies history of atrial fibrillation or irregular heartbeat  Vascular: No history of rest pain in feet.  + history of claudication.  No history of non-healing ulcer, +  history of DVT   Pulmonary: No home oxygen, no productive cough, no hemoptysis,  + wheezing  Musculoskeletal:  [ ]  Arthritis, [ ]  Low back pain,  [x ] Joint pain  Hematologic:No history of hypercoagulable state.  No history of easy bleeding.  No history of anemia  Gastrointestinal: No hematochezia or melena,  No gastroesophageal reflux, no trouble swallowing  Urinary: [x ] chronic Kidney disease, [ ]  on HD - [ ]  MWF or [ ]  TTHS, [ ]  Burning with urination, [ ]  Frequent urination, [ ]  Difficulty urinating;   Skin: No rashes  Psychological: No history of anxiety,  + history of depression   Physical Examination  Filed Vitals:   08/08/15 0917  BP: 122/70  Pulse: 78  Temp: 97.4 F (36.3 C)  TempSrc: Oral  Resp: 14  Height: 5\' 8"  (1.727 m)  Weight: 218 lb (98.884 kg)  SpO2: 97%    Body mass index is 33.15 kg/(m^2).  General:  Alert and oriented, no acute distress HEENT: Normal Neck: No bruit or JVD Pulmonary: Clear to auscultation bilaterally Cardiac: Regular Rate and Rhythm without murmur Abdomen: Soft, non-tender, non-distended, no mass Skin: No rash Extremity Pulses:  2+ radial, brachial,1+ left absent right femoral, 1+ bilateral dorsalis pedis, absent posterior tibial pulses bilaterally Musculoskeletal: No deformity or edema  Neurologic: Upper and lower extremity motor 5/5 and symmetric  DATA:  Recent ABIs reviewed which were 0.9 bilaterally  Patient's baseline serum creatinine is 2.6 last measured on  05/03/2015  ASSESSMENT:  Patient with some element of peripheral arterial disease. However his ABIs are only just below normal. He does have symptoms certainly sound like claudication. His pulse exam is not completely normal. However, due to his renal dysfunction and intervention may be complicated and risk injuring his kidneys. Believe the best option initially would be a walking program of 30 minutes daily. If he has improvement with this alone then that would be sufficient. If he still feels completely disabled despite a walking program we could consider further intervention and most of this could be done with noninvasive noncontrast procedures. However ultimately and intervention would require contrast and possibly compromise to his kidney. All this was discussed with the patient today. Also discussed with him that he is nowhere near any risk of limb loss at his current level of perfusion to his feet and by the fact that he does not have diabetes or smoke his risk of life, loss of limb is less than 5%. Also discussed with him that 75% of patients will improve a walking program alone.   PLAN:  Patient will try to walk for 30 minutes daily for 6 weeks. He'll return for follow-up for repeat duplex and ABIs that point. If he is still not showing any improvement we will consider intervention at that point.   Ruta Hinds, MD [facility] Office: [phone] Pager: [phone]

## 2015-08-09 NOTE — Addendum Note (Signed)
Addended by: Mena Goes on: 08/09/2015 04:29 PM   Modules accepted: Orders

## 2015-08-10 ENCOUNTER — Ambulatory Visit: Payer: Medicare Other | Admitting: Cardiovascular Disease

## 2015-08-10 ENCOUNTER — Encounter: Payer: Self-pay | Admitting: *Deleted

## 2015-08-10 DIAGNOSIS — I10 Essential (primary) hypertension: Secondary | ICD-10-CM | POA: Diagnosis not present

## 2015-08-10 DIAGNOSIS — N189 Chronic kidney disease, unspecified: Secondary | ICD-10-CM | POA: Diagnosis not present

## 2015-08-10 DIAGNOSIS — Z87891 Personal history of nicotine dependence: Secondary | ICD-10-CM | POA: Diagnosis not present

## 2015-08-10 DIAGNOSIS — I5022 Chronic systolic (congestive) heart failure: Secondary | ICD-10-CM | POA: Diagnosis not present

## 2015-08-10 DIAGNOSIS — J069 Acute upper respiratory infection, unspecified: Secondary | ICD-10-CM | POA: Diagnosis not present

## 2015-08-14 ENCOUNTER — Encounter: Payer: Self-pay | Admitting: Internal Medicine

## 2015-09-03 ENCOUNTER — Encounter: Payer: Self-pay | Admitting: Vascular Surgery

## 2015-09-04 ENCOUNTER — Ambulatory Visit (HOSPITAL_COMMUNITY)
Admission: RE | Admit: 2015-09-04 | Discharge: 2015-09-04 | Disposition: A | Discharge status: Home / Self Care | Payer: Medicare Other | Source: Ambulatory Visit | Attending: Vascular Surgery | Admitting: Vascular Surgery

## 2015-09-04 DIAGNOSIS — Z87891 Personal history of nicotine dependence: Secondary | ICD-10-CM | POA: Insufficient documentation

## 2015-09-04 DIAGNOSIS — I739 Peripheral vascular disease, unspecified: Secondary | ICD-10-CM

## 2015-09-04 DIAGNOSIS — I10 Essential (primary) hypertension: Secondary | ICD-10-CM | POA: Diagnosis not present

## 2015-09-04 LAB — VAS US LOWER EXTREMITY ARTERIAL DUPLEX
LPERODISTSYS: -20 cm/s
LPTIBDISTSYS: -13 cm/s
LSFDPSV: -32 cm/s
LSFMPSV: -55 cm/s
Left ant tibial distal sys: 22 cm/s
Left popliteal dist sys PSV: 34 cm/s
Left popliteal prox sys PSV: 32 cm/s
Left super femoral prox sys PSV: -61 cm/s
RATIBDISTSYS: -23 cm/s
RIGHT POST TIB DIST SYS: -27 cm/s
RPOPPPSV: 34 cm/s
Right peroneal sys PSV: -15 cm/s
Right popliteal dist sys PSV: -27 cm/s
Right super femoral dist sys PSV: -34 cm/s
Right super femoral mid sys PSV: -49 cm/s
Right super femoral prox sys PSV: -57 cm/s

## 2015-09-06 ENCOUNTER — Encounter: Payer: Self-pay | Admitting: Vascular Surgery

## 2015-09-06 ENCOUNTER — Ambulatory Visit (INDEPENDENT_AMBULATORY_CARE_PROVIDER_SITE_OTHER): Payer: Medicare Other | Admitting: Vascular Surgery

## 2015-09-06 VITALS — BP 114/70 | HR 77 | Temp 98.5°F | Resp 14 | Ht 68.0 in | Wt 217.0 lb

## 2015-09-06 DIAGNOSIS — I739 Peripheral vascular disease, unspecified: Secondary | ICD-10-CM

## 2015-09-06 NOTE — Progress Notes (Signed)
[facility]  Patient name: CAMRON ORSAK MRN: [id number] DOB: 11/08/1955 Sex: male  REASON FOR VISIT: Follow-up claudication  HPI: GOPAL MELODY is a 60 y.o. male who presents for evaluation of his bilateral leg pain with walking. This has been present for 4-5 years. He was recently seen on 08/08/2015 for the same complaint. His ABIs were 0.9 bilaterally with palpable dorsalis pedis pulses bilaterally. He has chronic kidney disease with baseline serum creatinine of 2.6 from March 2017. He was advised to start a walking program with follow up in 6 months.   He is back today because his symptoms are continuing to bother him. He is a former smoker, quitting many years ago. He has history of DVT on Xarelto and CAD. He is not diabetic.  He has a history of sciatica. He occasionally gets back pain with mowing the lawn and using the weed eater. However, on a day-to-day basis he denies back pain.  Past Medical History  Diagnosis Date  . Acute myocardial infarction, subendocardial infarction, initial episode of care ([place])   . Other primary cardiomyopathies   . Esophageal reflux   . Depressive disorder, not elsewhere classified   . Gout, unspecified   . Unspecified schizophrenia, unspecified condition   . Encounter for long-term (current) use of other medications   . Personal history of tobacco use, presenting hazards to health   . Hypotension, unspecified   . Mitral valve disorders     moderate MR  . Coronary atherosclerosis of native coronary artery   . Hypertension   . Other and unspecified hyperlipidemia   . Torn ligament   . Ischemic cardiomyopathy   . Hx of echocardiogram 2014    EF 40%  . CKD (chronic kidney disease) stage 3, GFR 30-59 ml/min     baseline Cr around 2.5  . 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 CHF (congestive heart failure) ([place])   .  History of DVT (deep vein thrombosis)     Family History  Problem Relation Age of Onset  . Colon cancer Neg Hx     SOCIAL HISTORY: Social History  Substance Use Topics  . Smoking status: Former Smoker -- 1.00 packs/day for 23 years    Types: Cigarettes    Quit date: 02/25/1996  . Smokeless tobacco: Never Used     Comment: + 15 years of smoking  . Alcohol Use: No    Allergies  Allergen Reactions  . Nifedipine Diarrhea  . Benazepril Other (See Comments)  . Haloperidol And Related Other (See Comments)  . Omeprazole Other (See Comments)    Current Outpatient Prescriptions  Medication Sig Dispense Refill  . amLODipine (NORVASC) 10 MG tablet Take 10 mg by mouth daily.    . ARIPiprazole (ABILIFY) 20 MG tablet Take 20 mg by mouth 2 (two) times daily.    [name] buPROPion (WELLBUTRIN) 100 MG tablet Take 200 mg by mouth 2 (two) times daily.     . Cholecalciferol 1000 UNITS capsule Take 2,000 Units by mouth daily.    [name] COLCRYS 0.6 MG tablet     . docusate sodium (COLACE) 100 MG capsule Take 1 capsule (100 mg total) by mouth 2 (two) times daily. 10 capsule 0  . hydrALAZINE (APRESOLINE) 100 MG tablet Take 100 mg by mouth 2 (two) times daily.    [name] HYDROcodone-acetaminophen (NORCO/VICODIN) 5-325 MG per tablet Take 1 tablet  by mouth every 6 (six) hours as needed for severe pain. Reported on 04/18/2015    . hydrocortisone (ANUSOL-HC) 25 MG suppository Insert one rectally daily 12 suppository 0  . isosorbide mononitrate (IMDUR) 60 MG 24 hr tablet Take 1 tablet (60 mg total) by mouth every morning. 90 tablet 4  . latanoprost (XALATAN) 0.005 % ophthalmic solution Place 1 drop into both eyes at bedtime.     . Linaclotide (LINZESS) 145 MCG CAPS capsule 1 PO 30 mins prior to your first meal 30 capsule 11  . LORazepam (ATIVAN) 1 MG tablet Take 2 mg by mouth at bedtime.     . metoprolol (LOPRESSOR) 100 MG tablet Take 100 mg by mouth 2 (two) times daily.     . pantoprazole (PROTONIX) 40 MG tablet Take 40  mg by mouth daily.    . polyethylene glycol (MIRALAX) packet Take 17 g by mouth daily. 14 each 0  . predniSONE (DELTASONE) 20 MG tablet take 2 tablets by mouth once daily with food  0  . QUEtiapine (SEROQUEL) 400 MG tablet Take 400 mg by mouth at bedtime.    . ranitidine (ZANTAC) 150 MG tablet Take 150 mg by mouth 2 (two) times daily as needed for heartburn. Reported on 04/18/2015    . sertraline (ZOLOFT) 100 MG tablet Take 150 mg by mouth daily.     . sildenafil (VIAGRA) 50 MG tablet Take 50 mg by mouth daily as needed for erectile dysfunction.    . simvastatin (ZOCOR) 40 MG tablet Take 1 tablet (40 mg total) by mouth at bedtime. 90 tablet 3  . spironolactone (ALDACTONE) 25 MG tablet Take 50 mg by mouth at bedtime.     Alveda Reasons 20 MG TABS tablet      No current facility-administered medications for this visit.    REVIEW OF SYSTEMS:  [X]  denotes positive finding, [ ]  denotes negative finding Cardiac  Comments:  Chest pain or chest pressure: x   Shortness of breath upon exertion:    Short of breath when lying flat:    Irregular heart rhythm:        Vascular    Pain in calf, thigh, or hip brought on by ambulation:    Pain in feet at night that wakes you up from your sleep:     Blood clot in your veins: x   Leg swelling:         Pulmonary    Oxygen at home:    Productive cough:     Wheezing:         Neurologic    Sudden weakness in arms or legs:     Sudden numbness in arms or legs:     Sudden onset of difficulty speaking or slurred speech:    Temporary loss of vision in one eye:     Problems with dizziness:         Gastrointestinal    Blood in stool:     Vomited blood:         Genitourinary    Burning when urinating:     Blood in urine:        Psychiatric    Major depression:  x       Hematologic    Bleeding problems:    Problems with blood clotting too easily:        Skin    Rashes or ulcers:        Constitutional    Fever or chills:  PHYSICAL  EXAM: Filed Vitals:   09/06/15 1332  BP: 114/70  Pulse: 77  Temp: 98.5 F (36.9 C)  TempSrc: Oral  Resp: 14  Height: 5\' 8"  (1.727 m)  Weight: 217 lb (98.431 kg)  SpO2: 97%    GENERAL: The patient is a well-nourished male, in no acute distress. The vital signs are documented above. VASCULAR: 1-2+ femoral pulses bilaterally. 2+ dorsalis pedis pulses bilaterally. PULMONARY: Nonlabored respiratory effort. MUSCULOSKELETAL: There are no major deformities or cyanosis. NEUROLOGIC: No focal weakness or paresthesias are detected. SKIN: There are no ulcers or rashes noted. PSYCHIATRIC: The patient has a normal affect.  DATA:  Lower extremity arterial duplex 09/04/2015. Patent lower extremity arteries bilaterally. Biphasic waveforms bilaterally.  ABIs 09/04/2015 Right 0.86 with biphasic posterior tibial waveforms and triphasic dorsalis pedis waveforms. TBI 0.95 Left 0.96 with biphasic posterior tibial waveforms and triphasic dorsalis pedis waveforms. TBI 1.11  MEDICAL ISSUES: Bilateral leg pain  The patient's ABIs are around 0.9 bilaterally. He does have 2+ dorsalis pedis pulses bilaterally. He complains that his leg pain is difficult to tolerate. He does not have a significant history of back pain, therefore his symptoms are not likely neurologic. Discussed that the next step is an arteriogram for further evaluation. The patient does not want to proceed given his chronic kidney disease and risk of contrast nephropathy. Discussed that the patient has less then 5% lifetime risk of limb loss. He will continue a walking program. He will follow-up in 6 months with repeat ABIs. If his symptoms do not improve, he will need to consider intervention.  Virgina Jock, PA-C [facility]   History and exam findings as above. Patient does have lower extremity pain with walking in both legs. He does have essentially normal ABIs with palpable pulses in his feet. He probably  does have some element of peripheral arterial disease but may also have some element of spinal stenosis as well. He does not want to proceed with arteriogram because he does not want to risk injury to his kidneys which I can certainly appreciate. He certainly is not at risk of limb loss at this point. He will continue trying to walk 30 minutes daily. He will follow-up with me in 6 months time with repeat ABIs.  Ruta Hinds, MD [facility] Office: [phone] Pager: [phone]

## 2015-09-10 ENCOUNTER — Ambulatory Visit: Payer: Medicare Other | Admitting: Nurse Practitioner

## 2015-09-10 ENCOUNTER — Telehealth: Payer: Self-pay | Admitting: Nurse Practitioner

## 2015-09-10 ENCOUNTER — Encounter: Payer: Self-pay | Admitting: Nurse Practitioner

## 2015-09-10 NOTE — Telephone Encounter (Signed)
Noted  

## 2015-09-10 NOTE — Telephone Encounter (Signed)
PT WAS A NO SHOW AND LETTER SENT  °

## 2015-09-14 DIAGNOSIS — E7800 Pure hypercholesterolemia, unspecified: Secondary | ICD-10-CM | POA: Diagnosis not present

## 2015-09-14 DIAGNOSIS — I10 Essential (primary) hypertension: Secondary | ICD-10-CM | POA: Diagnosis not present

## 2015-09-19 DIAGNOSIS — N189 Chronic kidney disease, unspecified: Secondary | ICD-10-CM | POA: Diagnosis not present

## 2015-09-19 DIAGNOSIS — M109 Gout, unspecified: Secondary | ICD-10-CM | POA: Diagnosis not present

## 2015-09-19 DIAGNOSIS — I5022 Chronic systolic (congestive) heart failure: Secondary | ICD-10-CM | POA: Diagnosis not present

## 2015-09-19 DIAGNOSIS — Z299 Encounter for prophylactic measures, unspecified: Secondary | ICD-10-CM | POA: Diagnosis not present

## 2015-09-20 ENCOUNTER — Ambulatory Visit: Payer: Medicare Other | Admitting: Vascular Surgery

## 2015-09-20 ENCOUNTER — Encounter (HOSPITAL_COMMUNITY): Payer: Medicare Other

## 2015-09-21 ENCOUNTER — Encounter: Payer: Self-pay | Admitting: Cardiovascular Disease

## 2015-09-21 ENCOUNTER — Ambulatory Visit (INDEPENDENT_AMBULATORY_CARE_PROVIDER_SITE_OTHER): Payer: Medicare Other | Admitting: Cardiovascular Disease

## 2015-09-21 VITALS — BP 120/72 | HR 81 | Ht 68.0 in | Wt 215.5 lb

## 2015-09-21 DIAGNOSIS — I059 Rheumatic mitral valve disease, unspecified: Secondary | ICD-10-CM | POA: Diagnosis not present

## 2015-09-21 DIAGNOSIS — I10 Essential (primary) hypertension: Secondary | ICD-10-CM

## 2015-09-21 DIAGNOSIS — I255 Ischemic cardiomyopathy: Secondary | ICD-10-CM | POA: Diagnosis not present

## 2015-09-21 DIAGNOSIS — R0602 Shortness of breath: Secondary | ICD-10-CM

## 2015-09-21 DIAGNOSIS — I2510 Atherosclerotic heart disease of native coronary artery without angina pectoris: Secondary | ICD-10-CM

## 2015-09-21 DIAGNOSIS — E785 Hyperlipidemia, unspecified: Secondary | ICD-10-CM

## 2015-09-21 MED ORDER — APIXABAN 5 MG PO TABS: 5.0000 mg | ORAL_TABLET | Freq: Two times a day (BID) | ORAL | 5 refills | Status: DC

## 2015-09-21 MED ORDER — APIXABAN 5 MG PO TABS: 5.0000 mg | ORAL_TABLET | Freq: Two times a day (BID) | ORAL | 0 refills | Status: DC

## 2015-09-21 NOTE — Patient Instructions (Addendum)
Medication Instructions:  Your physician has recommended you make the following change in your medication:  STOP taking xarelto START taking eliquis 5mg  twice daily   Labwork: none  Testing/Procedures: none  Follow-Up: Your physician wants you to follow-up in: six months with Dr. Fletcher Anon. You will receive a reminder letter in the mail two months in advance. If you don't receive a letter, please call our office to schedule the follow-up appointment.   Any Other Special Instructions Will Be Listed Below (If Applicable).     If you need a refill on your cardiac medications before your next appointment, please call your pharmacy.  Apixaban oral tablets What is this medicine? APIXABAN (a PIX a ban) is an anticoagulant (blood thinner). It is used to lower the chance of stroke in people with a medical condition called atrial fibrillation. It is also used to treat or prevent blood clots in the lungs or in the veins. This medicine may be used for other purposes; ask your health care provider or pharmacist if you have questions. What should I tell my health care provider before I take this medicine? They need to know if you have any of these conditions: -bleeding disorders -bleeding in the brain -blood in your stools (black or tarry stools) or if you have blood in your vomit -history of stomach bleeding -kidney disease -liver disease -mechanical heart valve -an unusual or allergic reaction to apixaban, other medicines, foods, dyes, or preservatives -pregnant or trying to get pregnant -breast-feeding How should I use this medicine? Take this medicine by mouth with a glass of water. Follow the directions on the prescription label. You can take it with or without food. If it upsets your stomach, take it with food. Take your medicine at regular intervals. Do not take it more often than directed. Do not stop taking except on your doctor's advice. Stopping this medicine may increase your risk  of a blot clot. Be sure to refill your prescription before you run out of medicine. Talk to your pediatrician regarding the use of this medicine in children. Special care may be needed. Overdosage: If you think you have taken too much of this medicine contact a poison control center or emergency room at once. NOTE: This medicine is only for you. Do not share this medicine with others. What if I miss a dose? If you miss a dose, take it as soon as you can. If it is almost time for your next dose, take only that dose. Do not take double or extra doses. What may interact with this medicine? This medicine may interact with the following: -aspirin and aspirin-like medicines -certain medicines for fungal infections like ketoconazole and itraconazole -certain medicines for seizures like carbamazepine and phenytoin -certain medicines that treat or prevent blood clots like warfarin, enoxaparin, and dalteparin -clarithromycin -NSAIDs, medicines for pain and inflammation, like ibuprofen or naproxen -rifampin -ritonavir -St. John's wort This list may not describe all possible interactions. Give your health care provider a list of all the medicines, herbs, non-prescription drugs, or dietary supplements you use. Also tell them if you smoke, drink alcohol, or use illegal drugs. Some items may interact with your medicine. What should I watch for while using this medicine? Notify your doctor or health care professional and seek emergency treatment if you develop breathing problems; changes in vision; chest pain; severe, sudden headache; pain, swelling, warmth in the leg; trouble speaking; sudden numbness or weakness of the face, arm, or leg. These can be signs that your  condition has gotten worse. If you are going to have surgery, tell your doctor or health care professional that you are taking this medicine. Tell your health care professional that you use this medicine before you have a spinal or epidural  procedure. Sometimes people who take this medicine have bleeding problems around the spine when they have a spinal or epidural procedure. This bleeding is very rare. If you have a spinal or epidural procedure while on this medicine, call your health care professional immediately if you have back pain, numbness or tingling (especially in your legs and feet), muscle weakness, paralysis, or loss of bladder or bowel control. Avoid sports and activities that might cause injury while you are using this medicine. Severe falls or injuries can cause unseen bleeding. Be careful when using sharp tools or knives. Consider using an Copy. Take special care brushing or flossing your teeth. Report any injuries, bruising, or red spots on the skin to your doctor or health care professional. What side effects may I notice from receiving this medicine? Side effects that you should report to your doctor or health care professional as soon as possible: -allergic reactions like skin rash, itching or hives, swelling of the face, lips, or tongue -signs and symptoms of bleeding such as bloody or black, tarry stools; red or dark-brown urine; spitting up blood or brown material that looks like coffee grounds; red spots on the skin; unusual bruising or bleeding from the eye, gums, or nose This list may not describe all possible side effects. Call your doctor for medical advice about side effects. You may report side effects to FDA at 1-800-FDA-1088. Where should I keep my medicine? Keep out of the reach of children. Store at room temperature between 20 and 25 degrees C (68 and 77 degrees F). Throw away any unused medicine after the expiration date. NOTE: This sheet is a summary. It may not cover all possible information. If you have questions about this medicine, talk to your doctor, pharmacist, or health care provider.    2016, [employer]vier/Gold Standard. (2012-10-15 11:59:24)

## 2015-09-21 NOTE — Progress Notes (Signed)
Cardiology Office Note   Date:  09/21/2015   ID:  Glenn Hawkins, DOB [birth year], MRN [id number]  PCP:  Glenda Chroman, MD  Cardiologist:   Kathlyn Sacramento, MD   Chief Complaint  Patient presents with  . Other    6 month follow up. Meds reviewed by the patient verbally. "doing well."       History of Present Illness: Glenn Hawkins is a 60 y.o. male who presents for a followup visit. He has known history of coronary artery disease status post myocardial infarction with late presentation. He is being treated medically and did not have cardiac catheterization done due to chronic kidney disease (creatinine around 2.5) and late presentation. He had a nuclear stress test in January of 2013 which showed mostly a large transmural infarct in the left circumflex distribution without significant ischemia. EF was 40-45% with moderate mitral regurgitation initially. Most recent echocardiogram in March 2014 showed an ejection fraction of 40% with mild mitral regurgitation. He had extensive right leg DVT in August 2016 and has been on anticoagulation with Xarelto. He also has mild bilateral calf claudication due to peripheral arterial disease and was seen by Dr. Oneida Alar for this. He has been doing very well and denies any chest pain or shortness of breath.  Past Medical History:  Diagnosis Date  . Acute myocardial infarction, subendocardial infarction, initial episode of care ([place])   . CHF (congestive heart failure) ([place])   . CKD (chronic kidney disease) stage 3, GFR 30-59 ml/min    baseline Cr around 2.5  . Coronary atherosclerosis of native coronary artery   . Depressive disorder, not elsewhere classified   . Encounter for long-term (current) use of other medications   . Esophageal reflux   . Gout, unspecified   . History of DVT (deep vein thrombosis)   . History of nuclear stress test 2013   large transmural infarct in the left circumflex distribution without significant ischemia  . Hx of  echocardiogram 2014   EF 40%  . Hypertension   . Hypotension, unspecified   . Ischemic cardiomyopathy   . Mitral valve disorders    moderate MR  . Other and unspecified hyperlipidemia   . Other primary cardiomyopathies   . Personal history of tobacco use, presenting hazards to health   . Torn ligament   . Unspecified schizophrenia, unspecified condition     Past Surgical History:  Procedure Laterality Date  . COLONOSCOPY    . COLONOSCOPY N/A 11/10/2014   Procedure: COLONOSCOPY;  Surgeon: Manus Gunning, MD;  Location: [facility] ENDOSCOPY;  Service: Gastroenterology;  Laterality: N/A;  . COLONOSCOPY WITH PROPOFOL N/A 05/08/2015   Procedure: COLONOSCOPY WITH PROPOFOL;  Surgeon: [name], MD;  Location: [facility] ENDO SUITE;  Service: Endoscopy;  Laterality: N/A;  1215 - office states unable to move patient  . ESOPHAGOGASTRODUODENOSCOPY (EGD) WITH PROPOFOL N/A 05/08/2015   Procedure: ESOPHAGOGASTRODUODENOSCOPY (EGD) WITH PROPOFOL;  Surgeon: [name], MD;  Location: [facility] ENDO SUITE;  Service: Endoscopy;  Laterality: N/A;  . EYE SURGERY    . None    . POLYPECTOMY  05/08/2015   Procedure: POLYPECTOMY;  Surgeon: [name], MD;  Location: [facility] ENDO SUITE;  Service: Endoscopy;;  transverse colon polyp     Current Outpatient Prescriptions  Medication Sig Dispense Refill  . amLODipine (NORVASC) 10 MG tablet Take 10 mg by mouth daily.    . ARIPiprazole (ABILIFY) 20 MG tablet Take 20 mg by mouth 2 (two) times  daily.    . buPROPion (WELLBUTRIN) 100 MG tablet Take 200 mg by mouth 2 (two) times daily.     . Cholecalciferol 1000 UNITS capsule Take 2,000 Units by mouth daily.    [name] COLCRYS 0.6 MG tablet     . docusate sodium (COLACE) 100 MG capsule Take 1 capsule (100 mg total) by mouth 2 (two) times daily. 10 capsule 0  . hydrALAZINE (APRESOLINE) 100 MG tablet Take 100 mg by mouth 2 (two) times daily.    [name] HYDROcodone-acetaminophen (NORCO/VICODIN) 5-325 MG per tablet Take 1 tablet by mouth  every 6 (six) hours as needed for severe pain. Reported on 04/18/2015    . hydrocortisone (ANUSOL-HC) 25 MG suppository Insert one rectally daily 12 suppository 0  . isosorbide mononitrate (IMDUR) 60 MG 24 hr tablet Take 1 tablet (60 mg total) by mouth every morning. 90 tablet 4  . latanoprost (XALATAN) 0.005 % ophthalmic solution Place 1 drop into both eyes at bedtime.     . Linaclotide (LINZESS) 145 MCG CAPS capsule 1 PO 30 mins prior to your first meal 30 capsule 11  . LORazepam (ATIVAN) 1 MG tablet Take 2 mg by mouth at bedtime.     . metoprolol (LOPRESSOR) 100 MG tablet Take 100 mg by mouth 2 (two) times daily.     . pantoprazole (PROTONIX) 40 MG tablet Take 40 mg by mouth daily.    . polyethylene glycol (MIRALAX) packet Take 17 g by mouth daily. 14 each 0  . predniSONE (DELTASONE) 20 MG tablet take 2 tablets by mouth once daily with food  0  . QUEtiapine (SEROQUEL) 400 MG tablet Take 400 mg by mouth at bedtime.    . ranitidine (ZANTAC) 150 MG tablet Take 150 mg by mouth 2 (two) times daily as needed for heartburn. Reported on 04/18/2015    . sertraline (ZOLOFT) 100 MG tablet Take 150 mg by mouth daily.     . sildenafil (VIAGRA) 50 MG tablet Take 50 mg by mouth daily as needed for erectile dysfunction.    . simvastatin (ZOCOR) 40 MG tablet Take 1 tablet (40 mg total) by mouth at bedtime. 90 tablet 3  . spironolactone (ALDACTONE) 25 MG tablet Take 50 mg by mouth at bedtime.     Alveda Reasons 20 MG TABS tablet      No current facility-administered medications for this visit.     Allergies:   Nifedipine; Benazepril; Haloperidol and related; and Omeprazole    Social History:  The patient  reports that he quit smoking about 19 years ago. His smoking use included Cigarettes. He has a 23.00 pack-year smoking history. He has never used smokeless tobacco. He reports that he does not drink alcohol or use drugs.   Family History:  The patient's family history is not on file.    ROS:  Please see the  history of present illness.   Otherwise, review of systems are positive for none.   All other systems are reviewed and negative.    PHYSICAL EXAM: VS:  BP 120/72 (BP Location: Left Arm, Patient Position: Sitting, Cuff Size: Normal)   Pulse 81   Ht 5\' 8"  (1.727 m)   Wt 215 lb 8 oz (97.8 kg)   BMI 32.77 kg/m  , BMI Body mass index is 32.77 kg/m. GEN: Well nourished, well developed, in no acute distress  HEENT: normal  Neck: no JVD, carotid bruits, or masses Cardiac: RRR; no murmurs, rubs, or gallops,no edema  Respiratory:  clear to auscultation  bilaterally, normal work of breathing GI: soft, nontender, nondistended, + BS MS: no deformity or atrophy  Skin: warm and dry, no rash Neuro:  Strength and sensation are intact Psych: euthymic mood, full affect   EKG:  EKG is ordered today. The ekg ordered today demonstrates normal sinus rhythm with anterolateral T wave changes suggestive of ischemia.   Recent Labs: 10/17/2014: B Natriuretic Peptide 1,052.4 11/08/2014: ALT 18; Magnesium 1.6 05/03/2015: BUN 24; Creatinine, Ser 2.63; Hemoglobin 9.6; Platelets 188; Potassium 4.4; Sodium 141    Lipid Panel    Component Value Date/Time   CHOL 191 11/09/2014 0525   TRIG 117 11/09/2014 0525   HDL 45 11/09/2014 0525   CHOLHDL 4.2 11/09/2014 0525   VLDL 23 11/09/2014 0525   LDLCALC 123 (H) 11/09/2014 0525      Wt Readings from Last 3 Encounters:  09/21/15 215 lb 8 oz (97.8 kg)  09/06/15 217 lb (98.4 kg)  08/08/15 218 lb (98.9 kg)       ASSESSMENT AND PLAN:  1.  Coronary artery disease involving native coronary arteries without angina: He continues to be clinically stable with no new symptoms. I recommend continuing medical therapy.  2. Chronic systolic heart failure: Most recent echocardiogram in 2014 showed an ejection fraction of 40% with inferolateral akinesis. Continue treatment with metoprolol and a combination of hydralazine/Imdur. He is not on ACE inhibitor or ARB due to  chronic kidney disease. We have to monitor his kidney function closely especially that he is on spironolactone. He should get basic metabolic profile every 6 months. Most recent labs in April showed a creatinine of 2.6 with a potassium of 4.4  3. Essential hypertension: Blood pressure is well controlled on current medications.  4. History of right leg DVT: Given his chronic kidney disease with creatinine clearance around 30, I switched him from Xarelto to Eliquis 5 mg twice daily.  5. Hyperlipidemia: Continue treatment with simvastatin with a target LDL of less than 70.   Disposition:   FU with me in 6 months  Signed,  Kathlyn Sacramento, MD  09/21/2015 10:43 AM    [name]

## 2015-10-01 ENCOUNTER — Other Ambulatory Visit: Payer: Self-pay | Admitting: Cardiovascular Disease

## 2015-10-01 ENCOUNTER — Telehealth: Payer: Self-pay

## 2015-10-01 NOTE — Telephone Encounter (Signed)
-----   Message from Elam Dutch, MD sent at 09/28/2015  4:50 PM EDT ----- Regarding: RE: DMV paperwork 5 years ----- Message ----- From: Denman George, RN Sent: 09/28/2015  10:42 AM To: Elam Dutch, MD Subject: DMV paperwork                                  This pt. brought in [facility] form yesterday; I didn't see it, so I'm assuming it is for handicapped parking.  He called stating the length of time the form was to be in effect, was not indicated; he said the options are 6 mos., 1 yr., or 5 yrs.  Do you feel this should be in effect for 1 yr.?  Please advise.  Thanks.

## 2015-10-01 NOTE — Telephone Encounter (Signed)
Pt. notified per phone that Dr. Oneida Alar has approved the [facility] paperwork for 5 years.  The pt. Verb. Understanding.

## 2015-10-08 DIAGNOSIS — E7800 Pure hypercholesterolemia, unspecified: Secondary | ICD-10-CM | POA: Diagnosis not present

## 2015-10-08 DIAGNOSIS — I10 Essential (primary) hypertension: Secondary | ICD-10-CM | POA: Diagnosis not present

## 2015-11-01 DIAGNOSIS — R350 Frequency of micturition: Secondary | ICD-10-CM | POA: Diagnosis not present

## 2015-11-01 DIAGNOSIS — Z299 Encounter for prophylactic measures, unspecified: Secondary | ICD-10-CM | POA: Diagnosis not present

## 2015-11-01 DIAGNOSIS — F200 Paranoid schizophrenia: Secondary | ICD-10-CM | POA: Diagnosis not present

## 2015-11-01 DIAGNOSIS — N400 Enlarged prostate without lower urinary tract symptoms: Secondary | ICD-10-CM | POA: Diagnosis not present

## 2015-11-01 DIAGNOSIS — N184 Chronic kidney disease, stage 4 (severe): Secondary | ICD-10-CM | POA: Diagnosis not present

## 2015-11-01 DIAGNOSIS — D649 Anemia, unspecified: Secondary | ICD-10-CM | POA: Diagnosis not present

## 2015-11-29 ENCOUNTER — Telehealth: Payer: Self-pay

## 2015-11-29 ENCOUNTER — Encounter: Payer: Self-pay | Admitting: Cardiovascular Disease

## 2015-11-29 NOTE — Telephone Encounter (Signed)
Pt states he is taking a lot of medication, and not sure which medicine is his heart medication. Please call.

## 2015-11-29 NOTE — Telephone Encounter (Signed)
LMOM to call the office back regarding heart medications.

## 2015-11-29 NOTE — Telephone Encounter (Signed)
This encounter was created in error - please disregard.

## 2015-12-03 NOTE — Telephone Encounter (Signed)
Spoke with patient regarding his cardiac medications. The patient has figured out medications for his heart and doesn't need any further assistance at this time.

## 2015-12-06 DIAGNOSIS — K648 Other hemorrhoids: Secondary | ICD-10-CM | POA: Diagnosis not present

## 2016-01-02 DIAGNOSIS — Z7189 Other specified counseling: Secondary | ICD-10-CM | POA: Diagnosis not present

## 2016-01-02 DIAGNOSIS — Z1211 Encounter for screening for malignant neoplasm of colon: Secondary | ICD-10-CM | POA: Diagnosis not present

## 2016-01-02 DIAGNOSIS — Z1389 Encounter for screening for other disorder: Secondary | ICD-10-CM | POA: Diagnosis not present

## 2016-01-02 DIAGNOSIS — Z0000 Encounter for general adult medical examination without abnormal findings: Secondary | ICD-10-CM | POA: Diagnosis not present

## 2016-01-02 DIAGNOSIS — Z299 Encounter for prophylactic measures, unspecified: Secondary | ICD-10-CM | POA: Diagnosis not present

## 2016-01-03 DIAGNOSIS — E7800 Pure hypercholesterolemia, unspecified: Secondary | ICD-10-CM | POA: Diagnosis not present

## 2016-01-03 DIAGNOSIS — Z125 Encounter for screening for malignant neoplasm of prostate: Secondary | ICD-10-CM | POA: Diagnosis not present

## 2016-01-03 DIAGNOSIS — R5383 Other fatigue: Secondary | ICD-10-CM | POA: Diagnosis not present

## 2016-01-03 DIAGNOSIS — Z79899 Other long term (current) drug therapy: Secondary | ICD-10-CM | POA: Diagnosis not present

## 2016-01-04 NOTE — Addendum Note (Signed)
Addended by: Mena Goes on: 01/04/2016 12:26 PM   Modules accepted: Orders

## 2016-01-30 DIAGNOSIS — I10 Essential (primary) hypertension: Secondary | ICD-10-CM | POA: Diagnosis not present

## 2016-01-30 DIAGNOSIS — E7800 Pure hypercholesterolemia, unspecified: Secondary | ICD-10-CM | POA: Diagnosis not present

## 2016-02-05 DIAGNOSIS — K648 Other hemorrhoids: Secondary | ICD-10-CM | POA: Diagnosis not present

## 2016-02-06 NOTE — H&P (Signed)
02/05/16  Patient Name: Glenn Hawkins Date of Birth: Jul 07, 1955  Vital Signs:  Vitals as of: 84/78/4128: Systolic 208: Diastolic 70: Heart Rate 82: Temp 36.67C (Temporal): Height 173CM: Weight 96.62KG: BMI 32.39    BMI: 32.39 kg/m2  Subjective: This 60 year old male presents for followup.  Would like to schedule surgery soon.  Still is having difficulties with prolapsing hemorrhoids. Patient has  significant complaints. Past medical history includes hypertension, schizophrenia, history of DVT, chronic kidney disease, history of cardiomyopathy Current medications include Norvasc, Abilify, alpresline, Imdur, Eliquis, Lopressor, Protonix, Seroquel  Social History: Reviewed  Social History  Preferred Language: English Race:  Black or African American Ethnicity: Not Hispanic / Latino Age: 3 year Marital Status:  S Alcohol: no       Allergies:  Allergies: No allergies and no drug allergies found.     Objective: General: Well appearing, well nourished in no distress. Lungs:  CTA Cor:  RRR without s3, s4, murmurs. Rectal:  Prolpasing internal hemorrhoid noted.  No active bleeding noted.   Assessment: Prolapsing hemorrhoids  Diagnoses: 455.8  K64.8 Hemorrhoids (Other hemorrhoids)  Procedures: 99213 - OFFICE OUTPATIENT VISIT 15 MINUTES     Plan: Patient will call to schedule surgery in December.  The risks and benefits of the procedure were reviewed with the patient, who gives informed consent.  Will need to hold eliquis three days before the procedure.   Follow-up:Pending Surgery

## 2016-02-12 DIAGNOSIS — M5416 Radiculopathy, lumbar region: Secondary | ICD-10-CM | POA: Diagnosis not present

## 2016-02-12 DIAGNOSIS — N189 Chronic kidney disease, unspecified: Secondary | ICD-10-CM | POA: Diagnosis not present

## 2016-02-12 DIAGNOSIS — I10 Essential (primary) hypertension: Secondary | ICD-10-CM | POA: Diagnosis not present

## 2016-02-12 DIAGNOSIS — Z299 Encounter for prophylactic measures, unspecified: Secondary | ICD-10-CM | POA: Diagnosis not present

## 2016-02-12 DIAGNOSIS — I5022 Chronic systolic (congestive) heart failure: Secondary | ICD-10-CM | POA: Diagnosis not present

## 2016-02-12 NOTE — Patient Instructions (Signed)
JACSON RAPAPORT  02/12/2016     @PREFPERIOPPHARMACY @   Your procedure is scheduled on  02/20/2016   Report to [facility] at  700  A.M.  Call this number if you have problems the morning of surgery:  [phone]   Remember:  Do not eat food or drink liquids after midnight.  Take these medicines the morning of surgery with A SIP OF WATER  Amlodipine, abilify, wellbutrin, imdur, ativan, metoprolol, seroquel, zoloft.   Do not wear jewelry, make-up or nail polish.  Do not wear lotions, powders, or perfumes, or deoderant.  Do not shave 48 hours prior to surgery.  Men may shave face and neck.  Do not bring valuables to the hospital.  [facility] is not responsible for any belongings or valuables.  Contacts, dentures or bridgework may not be worn into surgery.  Leave your suitcase in the car.  After surgery it may be brought to your room.  For patients admitted to the hospital, discharge time will be determined by your treatment team.  Patients discharged the day of surgery will not be allowed to drive home.   Name and phone number of your driver:   family Special instructions:  What did Dr Arnoldo Morale tell you to do about your eliquis?  Please read over the following fact sheets that you were given. Anesthesia Post-op Instructions and Care and Recovery After Surgery      Surgical Procedures for Hemorrhoids, Care After Introduction Refer to this sheet in the next few weeks. These instructions provide you with information about caring for yourself after your procedure. Your health care provider may also give you more specific instructions. Your treatment has been planned according to current medical practices, but problems sometimes occur. Call your health care provider if you have any problems or questions after your procedure. What can I expect after the procedure? After the procedure, it is common to have:  Rectal pain.  Pain when you are having a bowel  movement.  Slight rectal bleeding. Follow these instructions at home: Medicines  Take over-the-counter and prescription medicines only as told by your health care provider.  Do not drive or operate heavy machinery while taking prescription pain medicine.  Use a stool softener or a bulk laxative as told by your health care provider. Activity  Rest at home. Return to your normal activities as told by your health care provider.  Do not lift anything that is heavier than 10 lb (4.5 kg).  Do not sit for long periods of time. Take a walk every day or as told by your health care provider.  Do not strain to have a bowel movement. Do not spend a long time sitting on the toilet. Eating and drinking  Eat foods that contain fiber, such as whole grains, beans, nuts, fruits, and vegetables.  Drink enough fluid to keep your urine clear or pale yellow. General instructions  Sit in a warm bath 2-3 times per day to relieve soreness or itching.  Keep all follow-up visits as told by your health care provider. This is important. Contact a health care provider if:  Your pain medicine is not helping.  You have a fever or chills.  You become constipated.  You have trouble passing urine. Get help right away if:  You have very bad rectal pain.  You have heavy bleeding from your rectum. This information is not intended to replace advice given to you by your health care provider. Make  sure you discuss any questions you have with your health care provider. Document Released: 05/03/2003 Document Revised: 07/19/2015 Document Reviewed: 05/08/2014  2017 Elsevier PATIENT INSTRUCTIONS POST-ANESTHESIA  IMMEDIATELY FOLLOWING SURGERY:  Do not drive or operate machinery for the first twenty four hours after surgery.  Do not make any important decisions for twenty four hours after surgery or while taking narcotic pain medications or sedatives.  If you develop intractable nausea and vomiting or a severe  headache please notify your doctor immediately.  FOLLOW-UP:  Please make an appointment with your surgeon as instructed. You do not need to follow up with anesthesia unless specifically instructed to do so.  WOUND CARE INSTRUCTIONS (if applicable):  Keep a dry clean dressing on the anesthesia/puncture wound site if there is drainage.  Once the wound has quit draining you may leave it open to air.  Generally you should leave the bandage intact for twenty four hours unless there is drainage.  If the epidural site drains for more than 36-48 hours please call the anesthesia department.  QUESTIONS?:  Please feel free to call your physician or the hospital operator if you have any questions, and they will be happy to assist you.

## 2016-02-14 ENCOUNTER — Encounter (HOSPITAL_COMMUNITY)
Admission: RE | Admit: 2016-02-14 | Discharge: 2016-02-14 | Disposition: A | Discharge status: Home / Self Care | Payer: Medicare Other | Source: Ambulatory Visit | Attending: General Surgery | Admitting: General Surgery

## 2016-02-14 ENCOUNTER — Encounter (HOSPITAL_COMMUNITY): Payer: Self-pay

## 2016-02-14 DIAGNOSIS — Z01810 Encounter for preprocedural cardiovascular examination: Secondary | ICD-10-CM | POA: Diagnosis not present

## 2016-02-14 DIAGNOSIS — Z01812 Encounter for preprocedural laboratory examination: Secondary | ICD-10-CM | POA: Diagnosis not present

## 2016-02-14 LAB — CBC WITH DIFFERENTIAL/PLATELET
BASOS ABS: 0 10*3/uL (ref 0.0–0.1)
Basophils Relative: 0 %
EOS PCT: 0 %
Eosinophils Absolute: 0 10*3/uL (ref 0.0–0.7)
HEMATOCRIT: 32.4 % — AB (ref 39.0–52.0)
HEMOGLOBIN: 9.7 g/dL — AB (ref 13.0–17.0)
LYMPHS ABS: 0.9 10*3/uL (ref 0.7–4.0)
LYMPHS PCT: 8 %
MCH: 26 pg (ref 26.0–34.0)
MCHC: 29.9 g/dL — ABNORMAL LOW (ref 30.0–36.0)
MCV: 86.9 fL (ref 78.0–100.0)
Monocytes Absolute: 0.7 10*3/uL (ref 0.1–1.0)
Monocytes Relative: 6 %
NEUTROS ABS: 10.1 10*3/uL — AB (ref 1.7–7.7)
Neutrophils Relative %: 86 %
PLATELETS: 151 10*3/uL (ref 150–400)
RBC: 3.73 MIL/uL — AB (ref 4.22–5.81)
RDW: 17.2 % — ABNORMAL HIGH (ref 11.5–15.5)
WBC: 11.7 10*3/uL — AB (ref 4.0–10.5)

## 2016-02-14 LAB — BASIC METABOLIC PANEL
ANION GAP: 7 (ref 5–15)
BUN: 28 mg/dL — AB (ref 6–20)
CHLORIDE: 109 mmol/L (ref 101–111)
CO2: 25 mmol/L (ref 22–32)
Calcium: 8.8 mg/dL — ABNORMAL LOW (ref 8.9–10.3)
Creatinine, Ser: 2.21 mg/dL — ABNORMAL HIGH (ref 0.61–1.24)
GFR calc Af Amer: 36 mL/min — ABNORMAL LOW (ref >60)
GFR calc non Af Amer: 31 mL/min — ABNORMAL LOW (ref >60)
Glucose, Bld: 110 mg/dL — ABNORMAL HIGH (ref 65–99)
POTASSIUM: 4 mmol/L (ref 3.5–5.1)
SODIUM: 141 mmol/L (ref 135–145)

## 2016-02-20 ENCOUNTER — Ambulatory Visit (HOSPITAL_COMMUNITY): Payer: Medicare Other | Admitting: Anesthesiology

## 2016-02-20 ENCOUNTER — Encounter (HOSPITAL_COMMUNITY): Payer: Self-pay | Admitting: *Deleted

## 2016-02-20 ENCOUNTER — Ambulatory Visit (HOSPITAL_COMMUNITY)
Admission: RE | Admit: 2016-02-20 | Discharge: 2016-02-20 | Disposition: A | Discharge status: Home / Self Care | Payer: Medicare Other | Source: Ambulatory Visit | Attending: General Surgery | Admitting: General Surgery

## 2016-02-20 ENCOUNTER — Encounter (HOSPITAL_COMMUNITY)
Admission: RE | Disposition: A | Discharge status: Home / Self Care | Payer: Self-pay | Source: Ambulatory Visit | Attending: General Surgery

## 2016-02-20 DIAGNOSIS — I130 Hypertensive heart and chronic kidney disease with heart failure and stage 1 through stage 4 chronic kidney disease, or unspecified chronic kidney disease: Secondary | ICD-10-CM | POA: Insufficient documentation

## 2016-02-20 DIAGNOSIS — I2510 Atherosclerotic heart disease of native coronary artery without angina pectoris: Secondary | ICD-10-CM | POA: Diagnosis not present

## 2016-02-20 DIAGNOSIS — K648 Other hemorrhoids: Secondary | ICD-10-CM | POA: Insufficient documentation

## 2016-02-20 DIAGNOSIS — N189 Chronic kidney disease, unspecified: Secondary | ICD-10-CM | POA: Insufficient documentation

## 2016-02-20 DIAGNOSIS — K219 Gastro-esophageal reflux disease without esophagitis: Secondary | ICD-10-CM | POA: Insufficient documentation

## 2016-02-20 DIAGNOSIS — Z87891 Personal history of nicotine dependence: Secondary | ICD-10-CM | POA: Insufficient documentation

## 2016-02-20 DIAGNOSIS — I429 Cardiomyopathy, unspecified: Secondary | ICD-10-CM | POA: Insufficient documentation

## 2016-02-20 DIAGNOSIS — F209 Schizophrenia, unspecified: Secondary | ICD-10-CM | POA: Insufficient documentation

## 2016-02-20 DIAGNOSIS — I252 Old myocardial infarction: Secondary | ICD-10-CM | POA: Insufficient documentation

## 2016-02-20 DIAGNOSIS — K644 Residual hemorrhoidal skin tags: Secondary | ICD-10-CM | POA: Insufficient documentation

## 2016-02-20 DIAGNOSIS — Z86718 Personal history of other venous thrombosis and embolism: Secondary | ICD-10-CM | POA: Diagnosis not present

## 2016-02-20 DIAGNOSIS — I509 Heart failure, unspecified: Secondary | ICD-10-CM | POA: Insufficient documentation

## 2016-02-20 HISTORY — PX: HEMORRHOID SURGERY: SHX153

## 2016-02-20 SURGERY — HEMORRHOIDECTOMY
Anesthesia: General | Site: Rectum

## 2016-02-20 MED ORDER — SODIUM CHLORIDE 0.9 % IJ SOLN
INTRAMUSCULAR | Status: AC
  Filled 2016-02-20: qty 10

## 2016-02-20 MED ORDER — LIDOCAINE VISCOUS 2 % MT SOLN
OROMUCOSAL | Status: DC | PRN
Start: 2016-02-20 — End: 2016-02-20
  Administered 2016-02-20: 20 mL via OROMUCOSAL

## 2016-02-20 MED ORDER — BUPIVACAINE HCL (PF) 0.5 % IJ SOLN
INTRAMUSCULAR | Status: DC | PRN
  Administered 2016-02-20: 10 mL

## 2016-02-20 MED ORDER — LIDOCAINE HCL (CARDIAC) 20 MG/ML IV SOLN
INTRAVENOUS | Status: DC | PRN
  Administered 2016-02-20: 25 mg via INTRAVENOUS

## 2016-02-20 MED ORDER — SODIUM CHLORIDE 0.9 % IR SOLN
Status: DC | PRN
  Administered 2016-02-20: 1000 mL

## 2016-02-20 MED ORDER — EPHEDRINE SULFATE 50 MG/ML IJ SOLN
INTRAMUSCULAR | Status: DC | PRN
  Administered 2016-02-20: 5 mg via INTRAVENOUS

## 2016-02-20 MED ORDER — HYDROMORPHONE HCL 1 MG/ML IJ SOLN: 0.2500 mg | INTRAMUSCULAR | Status: DC | PRN

## 2016-02-20 MED ORDER — PHENYLEPHRINE 40 MCG/ML (10ML) SYRINGE FOR IV PUSH (FOR BLOOD PRESSURE SUPPORT)
PREFILLED_SYRINGE | INTRAVENOUS | Status: AC
  Filled 2016-02-20: qty 10

## 2016-02-20 MED ORDER — LACTATED RINGERS IV SOLN
INTRAVENOUS | Status: DC
  Administered 2016-02-20: 08:00:00 via INTRAVENOUS

## 2016-02-20 MED ORDER — SODIUM CHLORIDE 0.9 % IV SOLN
1.0000 g | INTRAVENOUS | Status: AC
  Administered 2016-02-20: 1 g via INTRAVENOUS
  Filled 2016-02-20: qty 1

## 2016-02-20 MED ORDER — PROPOFOL 10 MG/ML IV BOLUS
INTRAVENOUS | Status: AC
  Filled 2016-02-20: qty 40

## 2016-02-20 MED ORDER — SUCCINYLCHOLINE CHLORIDE 20 MG/ML IJ SOLN
INTRAMUSCULAR | Status: AC
  Filled 2016-02-20: qty 1

## 2016-02-20 MED ORDER — KETOROLAC TROMETHAMINE 30 MG/ML IJ SOLN
INTRAMUSCULAR | Status: AC
  Filled 2016-02-20: qty 1

## 2016-02-20 MED ORDER — KETOROLAC TROMETHAMINE 30 MG/ML IJ SOLN
30.0000 mg | Freq: Once | INTRAMUSCULAR | Status: AC
  Administered 2016-02-20: 30 mg via INTRAVENOUS

## 2016-02-20 MED ORDER — BUPIVACAINE HCL (PF) 0.5 % IJ SOLN
INTRAMUSCULAR | Status: AC
  Filled 2016-02-20: qty 30

## 2016-02-20 MED ORDER — CHLORHEXIDINE GLUCONATE CLOTH 2 % EX PADS: 6.0000 | MEDICATED_PAD | Freq: Once | CUTANEOUS | Status: DC

## 2016-02-20 MED ORDER — FENTANYL CITRATE (PF) 100 MCG/2ML IJ SOLN
25.0000 ug | INTRAMUSCULAR | Status: AC | PRN
  Administered 2016-02-20 (×2): 25 ug via INTRAVENOUS

## 2016-02-20 MED ORDER — ONDANSETRON HCL 4 MG/2ML IJ SOLN
INTRAMUSCULAR | Status: AC
  Filled 2016-02-20: qty 2

## 2016-02-20 MED ORDER — EPHEDRINE SULFATE 50 MG/ML IJ SOLN
INTRAMUSCULAR | Status: AC
  Filled 2016-02-20: qty 1

## 2016-02-20 MED ORDER — FENTANYL CITRATE (PF) 100 MCG/2ML IJ SOLN
INTRAMUSCULAR | Status: DC | PRN
  Administered 2016-02-20 (×2): 25 ug via INTRAVENOUS

## 2016-02-20 MED ORDER — HEMOSTATIC AGENTS (NO CHARGE) OPTIME
TOPICAL | Status: DC | PRN
  Administered 2016-02-20: 1 via TOPICAL

## 2016-02-20 MED ORDER — FENTANYL CITRATE (PF) 100 MCG/2ML IJ SOLN
INTRAMUSCULAR | Status: AC
  Filled 2016-02-20: qty 2

## 2016-02-20 MED ORDER — MIDAZOLAM HCL 2 MG/2ML IJ SOLN
INTRAMUSCULAR | Status: AC
  Filled 2016-02-20: qty 2

## 2016-02-20 MED ORDER — PROPOFOL 10 MG/ML IV BOLUS
INTRAVENOUS | Status: DC | PRN
  Administered 2016-02-20: 40 mg via INTRAVENOUS
  Administered 2016-02-20: 80 mg via INTRAVENOUS

## 2016-02-20 MED ORDER — LIDOCAINE HCL (PF) 1 % IJ SOLN
INTRAMUSCULAR | Status: AC
  Filled 2016-02-20: qty 5

## 2016-02-20 MED ORDER — OXYCODONE-ACETAMINOPHEN 7.5-325 MG PO TABS: 1.0000 | ORAL_TABLET | ORAL | 0 refills | Status: DC | PRN

## 2016-02-20 MED ORDER — MIDAZOLAM HCL 2 MG/2ML IJ SOLN
1.0000 mg | INTRAMUSCULAR | Status: DC | PRN
  Administered 2016-02-20: 2 mg via INTRAVENOUS

## 2016-02-20 MED ORDER — LIDOCAINE VISCOUS 2 % MT SOLN
OROMUCOSAL | Status: AC
  Filled 2016-02-20: qty 15

## 2016-02-20 MED ORDER — ONDANSETRON HCL 4 MG/2ML IJ SOLN
4.0000 mg | Freq: Once | INTRAMUSCULAR | Status: AC
  Administered 2016-02-20: 4 mg via INTRAVENOUS

## 2016-02-20 MED ORDER — PHENYLEPHRINE HCL 10 MG/ML IJ SOLN
INTRAMUSCULAR | Status: DC | PRN
  Administered 2016-02-20: 40 ug via INTRAVENOUS

## 2016-02-20 SURGICAL SUPPLY — 33 items
BAG HAMPER (MISCELLANEOUS) ×3 IMPLANT
CLOTH BEACON ORANGE TIMEOUT ST (SAFETY) ×3 IMPLANT
COVER LIGHT HANDLE STERIS (MISCELLANEOUS) ×6 IMPLANT
DECANTER SPIKE VIAL GLASS SM (MISCELLANEOUS) ×3 IMPLANT
DRAPE PROXIMA HALF (DRAPES) ×3 IMPLANT
ELECT REM PT RETURN 9FT ADLT (ELECTROSURGICAL) ×3
ELECTRODE REM PT RTRN 9FT ADLT (ELECTROSURGICAL) ×1 IMPLANT
FORMALIN 10 PREFIL 120ML (MISCELLANEOUS) ×3 IMPLANT
GAUZE SPONGE 4X4 12PLY STRL (GAUZE/BANDAGES/DRESSINGS) ×3 IMPLANT
GLOVE BIOGEL PI IND STRL 6.5 (GLOVE) ×1 IMPLANT
GLOVE BIOGEL PI IND STRL 7.0 (GLOVE) ×1 IMPLANT
GLOVE BIOGEL PI INDICATOR 6.5 (GLOVE) ×2
GLOVE BIOGEL PI INDICATOR 7.0 (GLOVE) ×2
GLOVE EXAM NITRILE MD LF STRL (GLOVE) ×3 IMPLANT
GLOVE SURG SS PI 6.5 STRL IVOR (GLOVE) ×3 IMPLANT
GLOVE SURG SS PI 7.5 STRL IVOR (GLOVE) ×6 IMPLANT
GOWN STRL REUS W/ TWL XL LVL3 (GOWN DISPOSABLE) ×1 IMPLANT
GOWN STRL REUS W/TWL LRG LVL3 (GOWN DISPOSABLE) ×3 IMPLANT
GOWN STRL REUS W/TWL XL LVL3 (GOWN DISPOSABLE) ×2
HEMOSTAT SURGICEL 4X8 (HEMOSTASIS) ×3 IMPLANT
KIT ROOM TURNOVER AP CYSTO (KITS) ×3 IMPLANT
LIGASURE IMPACT 36 18CM CVD LR (INSTRUMENTS) ×3 IMPLANT
MANIFOLD NEPTUNE II (INSTRUMENTS) ×3 IMPLANT
NEEDLE HYPO 25X1 1.5 SAFETY (NEEDLE) ×3 IMPLANT
NS IRRIG 1000ML POUR BTL (IV SOLUTION) ×3 IMPLANT
PACK PERI GYN (CUSTOM PROCEDURE TRAY) ×3 IMPLANT
PAD ARMBOARD 7.5X6 YLW CONV (MISCELLANEOUS) ×3 IMPLANT
SEALER TISSUE X1 CVD JAW (INSTRUMENTS) IMPLANT
SET BASIN LINEN APH (SET/KITS/TRAYS/PACK) ×3 IMPLANT
SURGILUBE 3G PEEL PACK STRL (MISCELLANEOUS) ×3 IMPLANT
SUT SILK 0 FSL (SUTURE) ×3 IMPLANT
SUT VIC AB 2-0 CT2 27 (SUTURE) IMPLANT
SYR CONTROL 10ML LL (SYRINGE) ×3 IMPLANT

## 2016-02-20 NOTE — Interval H&P Note (Signed)
History and Physical Interval Note:  02/20/2016 8:14 AM  Glenn Hawkins  has presented today for surgery, with the diagnosis of prolapsing hemorrhoids  The various methods of treatment have been discussed with the patient and family. After consideration of risks, benefits and other options for treatment, the patient has consented to  Procedure(s): EXTENSIVE HEMORRHOIDECTOMY (N/A) as a surgical intervention .  The patient's history has been reviewed, patient examined, no change in status, stable for surgery.  I have reviewed the patient's chart and labs.  Questions were answered to the patient's satisfaction.     [name] A

## 2016-02-20 NOTE — Anesthesia Procedure Notes (Signed)
Procedure Name: LMA Insertion Date/Time: 02/20/2016 8:40 AM Performed by: Andree Elk, Citlalli Weikel A Pre-anesthesia Checklist: Patient identified, Timeout performed, Emergency Drugs available, Suction available and Patient being monitored Patient Re-evaluated:Patient Re-evaluated prior to inductionOxygen Delivery Method: Circle system utilized Preoxygenation: Pre-oxygenation with 100% oxygen Intubation Type: IV induction Ventilation: Mask ventilation without difficulty LMA: LMA inserted LMA Size: 5.0 Number of attempts: 1 Placement Confirmation: positive ETCO2 and breath sounds checked- equal and bilateral Tube secured with: Tape Dental Injury: Teeth and Oropharynx as per pre-operative assessment

## 2016-02-20 NOTE — Discharge Instructions (Signed)
Surgical Procedures for Hemorrhoids, Care After Introduction Refer to this sheet in the next few weeks. These instructions provide you with information about caring for yourself after your procedure. Your health care provider may also give you more specific instructions. Your treatment has been planned according to current medical practices, but problems sometimes occur. Call your health care provider if you have any problems or questions after your procedure. What can I expect after the procedure? After the procedure, it is common to have:  Rectal pain.  Pain when you are having a bowel movement.  Slight rectal bleeding. Follow these instructions at home: Medicines  Take over-the-counter and prescription medicines only as told by your health care provider.  Do not drive or operate heavy machinery while taking prescription pain medicine.  Use a stool softener or a bulk laxative as told by your health care provider. Activity  Rest at home. Return to your normal activities as told by your health care provider.  Do not lift anything that is heavier than 10 lb (4.5 kg).  Do not sit for long periods of time. Take a walk every day or as told by your health care provider.  Do not strain to have a bowel movement. Do not spend a long time sitting on the toilet. Eating and drinking  Eat foods that contain fiber, such as whole grains, beans, nuts, fruits, and vegetables.  Drink enough fluid to keep your urine clear or pale yellow. General instructions  Sit in a warm bath 2-3 times per day to relieve soreness or itching.  Keep all follow-up visits as told by your health care provider. This is important. Contact a health care provider if:  Your pain medicine is not helping.  You have a fever or chills.  You become constipated.  You have trouble passing urine. Get help right away if:  You have very bad rectal pain.  You have heavy bleeding from your rectum. This information is  not intended to replace advice given to you by your health care provider. Make sure you discuss any questions you have with your health care provider. Document Released: 05/03/2003 Document Revised: 07/19/2015 Document Reviewed: 05/08/2014  2017 Elsevier

## 2016-02-20 NOTE — Transfer of Care (Signed)
Immediate Anesthesia Transfer of Care Note  Patient: Glenn Hawkins  Procedure(s) Performed: Procedure(s): EXTENSIVE HEMORRHOIDECTOMY (N/A)  Patient Location: PACU  Anesthesia Type:General  Level of Consciousness: awake, oriented and patient cooperative  Airway & Oxygen Therapy: Patient Spontanous Breathing and Patient connected to face mask oxygen  Post-op Assessment: Report given to RN and Post -op Vital signs reviewed and stable  Post vital signs: Reviewed and stable  Last Vitals:  Vitals:   02/20/16 0810 02/20/16 0815  BP: (!) 144/91 (!) 142/88  Resp: (!) 29 15  Temp:      Last Pain: There were no vitals filed for this visit.    Patients Stated Pain Goal: 6 (48/40/39 7953)  Complications: No apparent anesthesia complications

## 2016-02-20 NOTE — Anesthesia Postprocedure Evaluation (Signed)
Anesthesia Post Note  Patient: Glenn Hawkins  Procedure(s) Performed: Procedure(s) (LRB): EXTENSIVE HEMORRHOIDECTOMY (N/A)  Patient location during evaluation: PACU Anesthesia Type: General Level of consciousness: awake and alert and oriented Pain management: pain level controlled Vital Signs Assessment: post-procedure vital signs reviewed and stable Respiratory status: spontaneous breathing and respiratory function stable Cardiovascular status: stable Postop Assessment: no signs of nausea or vomiting Anesthetic complications: no     Last Vitals:  Vitals:   02/20/16 0815 02/20/16 0926  BP: (!) 142/88   Resp: 15   Temp:  (P) 36.8 C    Last Pain:  Vitals:   02/20/16 0930  PainSc: (P) 0-No pain                 ADAMS, AMY A

## 2016-02-20 NOTE — Op Note (Signed)
Patient:  OCTAVIUS SHIN  DOB:  11-04-1955  MRN:  [id number]   Preop Diagnosis:  Internal and external hemorrhoidal disease  Postop Diagnosis:  Same, partial mucosal prolapse  Procedure:  Extensive hemorrhoidectomy  Surgeon:  [name], M.D.  Anes:  Gen.  Indications:  Patient is a 60 year old black male who has had intermittent episodes of blood per rectum secondary to hemorrhoidal disease. The risks and benefits of procedure including bleeding, infection, and recurrence of hemorrhoidal disease were fully explained to the patient, who gave informed consent.  Procedure note:  The patient was placed in the lithotomy position after general anesthesia was administered. The perineum was prepped and draped using the usual sterile technique with Betadine. Surgical site confirmation was performed.  On anoscopy, he had extensive internal and external hemorrhoids at the 11:00, 1:00, 4:00, and 7:00 positions. Partial prolapse of the mucosal was noted at the 11:00 and 4:00 positions. All hemorrhoids except for the ones at the 7:00 position were excised in a collar-like fashion using the LigaSure. No bleeding was noted at the end of the procedure. Care was taken to avoid the external sphincter mechanism. 0.5% Sensorcaine was instilled into the surrounding perineum. Surgicel and Viscous Xylocaine rectal packing was placed.  All tape and needle counts were correct at the end of the procedure. Patient was awakened and transferred to PACU in stable condition.  Complications:  None  EBL:  Minimal  Specimen:  Hemorrhoids

## 2016-02-20 NOTE — Anesthesia Preprocedure Evaluation (Signed)
Anesthesia Evaluation  Patient identified by MRN, date of birth, ID band Patient awake    Reviewed: Allergy & Precautions, NPO status , Patient's Chart, lab work & pertinent test results  Airway Mallampati: II  TM Distance: >3 FB Neck ROM: Full    Dental no notable dental hx.    Pulmonary neg pulmonary ROS, former smoker,    Pulmonary exam normal breath sounds clear to auscultation       Cardiovascular hypertension, + CAD, + Past MI, + Peripheral Vascular Disease, +CHF and + DVT  + Valvular Problems/Murmurs MR  Rhythm:Regular Rate:Normal - Systolic murmurs  Hx of echocardiogram 2014 EF 40%     Neuro/Psych PSYCHIATRIC DISORDERS Depression Schizophrenia negative neurological ROS     GI/Hepatic negative GI ROS, Neg liver ROS, GERD  ,  Endo/Other  negative endocrine ROS  Renal/GU Renal InsufficiencyRenal disease  negative genitourinary   Musculoskeletal negative musculoskeletal ROS (+)   Abdominal   Peds negative pediatric ROS (+)  Hematology negative hematology ROS (+)   Anesthesia Other Findings   Reproductive/Obstetrics negative OB ROS                             Anesthesia Physical Anesthesia Plan  ASA: III  Anesthesia Plan: General   Post-op Pain Management:    Induction: Intravenous  Airway Management Planned: LMA  Additional Equipment:   Intra-op Plan:   Post-operative Plan: Extubation in OR  Informed Consent: I have reviewed the patients History and Physical, chart, labs and discussed the procedure including the risks, benefits and alternatives for the proposed anesthesia with the patient or authorized representative who has indicated his/her understanding and acceptance.   Dental advisory given  Plan Discussed with: CRNA and Surgeon  Anesthesia Plan Comments:         Anesthesia Quick Evaluation

## 2016-02-22 ENCOUNTER — Encounter (HOSPITAL_COMMUNITY): Payer: Self-pay | Admitting: General Surgery

## 2016-02-27 DIAGNOSIS — Z87891 Personal history of nicotine dependence: Secondary | ICD-10-CM | POA: Diagnosis not present

## 2016-02-27 DIAGNOSIS — M109 Gout, unspecified: Secondary | ICD-10-CM | POA: Diagnosis not present

## 2016-02-27 DIAGNOSIS — I5022 Chronic systolic (congestive) heart failure: Secondary | ICD-10-CM | POA: Diagnosis not present

## 2016-02-27 DIAGNOSIS — N184 Chronic kidney disease, stage 4 (severe): Secondary | ICD-10-CM | POA: Diagnosis not present

## 2016-02-27 DIAGNOSIS — Z299 Encounter for prophylactic measures, unspecified: Secondary | ICD-10-CM | POA: Diagnosis not present

## 2016-02-27 DIAGNOSIS — I10 Essential (primary) hypertension: Secondary | ICD-10-CM | POA: Diagnosis not present

## 2016-03-11 ENCOUNTER — Encounter: Payer: Self-pay | Admitting: Vascular Surgery

## 2016-03-13 ENCOUNTER — Ambulatory Visit: Payer: Medicare Other | Admitting: Vascular Surgery

## 2016-03-13 ENCOUNTER — Encounter (HOSPITAL_COMMUNITY): Payer: Medicare Other

## 2016-03-20 ENCOUNTER — Telehealth: Payer: Self-pay

## 2016-03-20 NOTE — Telephone Encounter (Signed)
Phone call rec'd from pt.  Reported right knee swelling and sore x 2 days.  Stated he has not noticed if it has been red/warm.  Denied fever/ chills.  Reported "it hurts to walk the first thing in AM, but after moving around a little, the knee feels better."  Also c/o that he has discomfort in right thigh from groin to knee.  Denied swelling in the right thigh, and lower leg, with exception of "some swelling in right ankle that has been present, since an ankle injury a long time ago."  Stated "the discomfort in the right thigh has been going on for awhile."   Questioned about claudication symptoms; reported he has intermittent discomfort in calves with walking, that eases with rest.  Denied any open sores of lower extremities.  Discussed pt's symptoms with Dr. Oneida Alar.  Recommended to go to PCP re: right thigh pain and right knee swelling.  Notified patient of Dr. Oneida Alar recommendations.  Encouraged to keep his f/u appt. in March for PVD surveillance.  Verb. Understanding.  Agreed to call PCP for current problem.

## 2016-03-21 ENCOUNTER — Ambulatory Visit (INDEPENDENT_AMBULATORY_CARE_PROVIDER_SITE_OTHER): Payer: Medicare Other | Admitting: Nurse Practitioner

## 2016-03-21 ENCOUNTER — Encounter: Payer: Self-pay | Admitting: Cardiovascular Disease

## 2016-03-21 VITALS — BP 150/82 | HR 72 | Ht 68.0 in | Wt 217.5 lb

## 2016-03-21 DIAGNOSIS — I110 Hypertensive heart disease with heart failure: Secondary | ICD-10-CM

## 2016-03-21 DIAGNOSIS — I2510 Atherosclerotic heart disease of native coronary artery without angina pectoris: Secondary | ICD-10-CM | POA: Diagnosis not present

## 2016-03-21 DIAGNOSIS — N189 Chronic kidney disease, unspecified: Secondary | ICD-10-CM | POA: Diagnosis not present

## 2016-03-21 DIAGNOSIS — I255 Ischemic cardiomyopathy: Secondary | ICD-10-CM | POA: Diagnosis not present

## 2016-03-21 DIAGNOSIS — Z6830 Body mass index (BMI) 30.0-30.9, adult: Secondary | ICD-10-CM | POA: Diagnosis not present

## 2016-03-21 DIAGNOSIS — E782 Mixed hyperlipidemia: Secondary | ICD-10-CM | POA: Diagnosis not present

## 2016-03-21 DIAGNOSIS — I5022 Chronic systolic (congestive) heart failure: Secondary | ICD-10-CM

## 2016-03-21 DIAGNOSIS — M25561 Pain in right knee: Secondary | ICD-10-CM | POA: Diagnosis not present

## 2016-03-21 DIAGNOSIS — Z299 Encounter for prophylactic measures, unspecified: Secondary | ICD-10-CM | POA: Diagnosis not present

## 2016-03-21 DIAGNOSIS — I10 Essential (primary) hypertension: Secondary | ICD-10-CM | POA: Diagnosis not present

## 2016-03-21 MED ORDER — NITROGLYCERIN 0.4 MG SL SUBL: 0.4000 mg | SUBLINGUAL_TABLET | SUBLINGUAL | 3 refills | Status: DC | PRN

## 2016-03-21 NOTE — Progress Notes (Signed)
Office Visit    Patient Name: Glenn Hawkins Date of Encounter: 03/21/2016  Primary Care Provider:  Glenda Chroman, MD Primary Cardiologist:  Jerilynn Mages. Fletcher Anon, MD   Chief Complaint    61 y/o ? with a h/o OOH MI in 2013 - conservatively managed 2/2 CKD III, HTN, HL, claudication, and ICM, who presents for f/u.  Past Medical History    Past Medical History:  Diagnosis Date  . CKD (chronic kidney disease) stage 3, GFR 30-59 ml/min    baseline Cr around 2.5  . Claudication ([place])    a. 08/2015 ABI/duplex: R: 0.86, L 0.96. Duplex w/ bilateral heterogeneous plaque in mid fem arteries, no significant stenoses.  . Coronary atherosclerosis of native coronary artery    a. 02/2011 Late presentation MI-->Myoview w/ large area of transmural infarct in LCX distribution, no ischemia.  . Depressive disorder, not elsewhere classified   . Esophageal reflux   . Gout, unspecified   . Hemorrhoids    a. 01/2016 s/p hemorrhoidectomy.  [name] History of DVT (deep vein thrombosis)    a. Chronic Eliquis.  . Hypertension   . Hypotension, unspecified   . Ischemic cardiomyopathy    a. 04/2012 Echo: EF 40%, inflat AK, Gr1 DD, mild AI/MR, triv TR.  . 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 04/2012 Echo: EF 40%, mild MR.  . Myocardial infarction (lateral wall) ([place]) 2013   a. 02/2011 - late presentation. Managed conservatively 2/2 CKD;  b. 02/2011 Myoview:  Large transmural infarct in the LCX distribution, no ischemia.  . Other and unspecified hyperlipidemia   . Personal history of tobacco use, presenting hazards to health   . Torn ligament   . Unspecified schizophrenia, unspecified condition    Past Surgical History:  Procedure Laterality Date  . COLONOSCOPY    . COLONOSCOPY N/A 11/10/2014   Procedure: COLONOSCOPY;  Surgeon: Manus Gunning, MD;  Location: [facility] ENDOSCOPY;  Service: Gastroenterology;  Laterality: N/A;  . COLONOSCOPY WITH PROPOFOL N/A 05/08/2015   Procedure: COLONOSCOPY WITH PROPOFOL;  Surgeon: [name], MD;  Location: [facility] ENDO SUITE;  Service: Endoscopy;  Laterality: N/A;  1215 - office states unable to move patient  . ESOPHAGOGASTRODUODENOSCOPY (EGD) WITH PROPOFOL N/A 05/08/2015   Procedure: ESOPHAGOGASTRODUODENOSCOPY (EGD) WITH PROPOFOL;  Surgeon: [name], MD;  Location: [facility] ENDO SUITE;  Service: Endoscopy;  Laterality: N/A;  . EYE SURGERY Right    removal of foreign body  . HEMORRHOID SURGERY N/A 02/20/2016   Procedure: EXTENSIVE HEMORRHOIDECTOMY;  Surgeon: [name], MD;  Location: [facility] ORS;  Service: General;  Laterality: N/A;  . None    . POLYPECTOMY  05/08/2015   Procedure: POLYPECTOMY;  Surgeon: [name], MD;  Location: [facility] ENDO SUITE;  Service: Endoscopy;;  transverse colon polyp    Allergies  Allergies  Allergen Reactions  . Nifedipine Diarrhea  . Rabeprazole Other (See Comments)    unknown  . Benazepril Other (See Comments)  . Haloperidol And Related Other (See Comments)    anxiety  . Omeprazole Other (See Comments)    unknown    History of Present Illness    61 y/o ? with the above complex PMH.  He is s/p OOH MI in 2013.  This was conservatively managed 2/2 CKD III and late presentation.  Myoview @ the time did show a large area of infarct in the LCX distribution w/o ischemia. EF 40% by echo in 2014.  Other hx includes HTN, HL, obesity, gout, depression, ED,  and lower ext claudication with mildly abnl ABI on R.    From a cardiac standpoint, he has been doing well since his last visit in July 2017.  He denies chest pain, palpitations, dyspnea, pnd, orthopnea, n, v, dizziness, syncope, edema, weight gain, or early satiety. He was recently treated for a gout flare involving his left knee  improved with prednisone taper.  Over the past three days he has had mild right knee swelling and pain/tenderness.  He plans to f/u with his pcp.  Home Medications    Prior to Admission medications   Medication Sig Start Date End Date Taking? Authorizing Provider    amLODipine (NORVASC) 10 MG tablet Take 10 mg by mouth daily.   Yes Historical Provider, MD  apixaban (ELIQUIS) 5 MG TABS tablet Take 1 tablet (5 mg total) by mouth 2 (two) times daily. 09/21/15  Yes Wellington Hampshire, MD  ARIPiprazole (ABILIFY) 20 MG tablet Take 20 mg by mouth 2 (two) times daily.   Yes Historical Provider, MD  buPROPion (WELLBUTRIN) 100 MG tablet Take 200 mg by mouth 2 (two) times daily. Takes 2 tablets at bedtime   Yes Historical Provider, MD  COLCRYS 0.6 MG tablet Take 0.6 mg by mouth every evening.  05/22/15  Yes Historical Provider, MD  docusate sodium (COLACE) 100 MG capsule Take 1 capsule (100 mg total) by mouth 2 (two) times daily. Patient taking differently: Take 200 mg by mouth daily as needed for mild constipation.  11/10/14  Yes Donne Hazel, MD  hydrALAZINE (APRESOLINE) 100 MG tablet Take 100 mg by mouth 3 (three) times daily.    Yes Historical Provider, MD  isosorbide mononitrate (IMDUR) 60 MG 24 hr tablet TAKE 1 TABLET EVERY MORNING 10/01/15  Yes Wellington Hampshire, MD  LORazepam (ATIVAN) 1 MG tablet Take 2 mg by mouth at bedtime.    Yes Historical Provider, MD  metoprolol (LOPRESSOR) 100 MG tablet Take 100 mg by mouth 2 (two) times daily.    Yes Historical Provider, MD  oxyCODONE-acetaminophen (PERCOCET) 7.5-325 MG tablet Take 1 tablet by mouth every 4 (four) hours as needed. 02/20/16  Yes [name], MD  QUEtiapine (SEROQUEL) 400 MG tablet Take 400 mg by mouth at bedtime.   Yes Historical Provider, MD  sertraline (ZOLOFT) 100 MG tablet Take 200 mg by mouth daily. Takes 2 tablets daily   Yes Historical Provider, MD  sildenafil (VIAGRA) 50 MG tablet Take 12.5 mg by mouth daily as needed for erectile dysfunction. Takes 1/4 of a tablet when needed   Yes Historical Provider, MD  simvastatin (ZOCOR) 40 MG tablet Take 1 tablet (40 mg total) by mouth at bedtime. 04/12/15  Yes Wellington Hampshire, MD  spironolactone (ALDACTONE) 25 MG tablet Take 50 mg by mouth at bedtime. Takes 2  tablets at bedtime   Yes Historical Provider, MD  nitroGLYCERIN (NITROSTAT) 0.4 MG SL tablet Place 1 tablet (0.4 mg total) under the tongue every 5 (five) minutes as needed for chest pain. 03/21/16   Wellington Hampshire, MD    Review of Systems    Right knee pain/tenderness over the past 3 days.  He denies chest pain, palpitations, dyspnea, pnd, orthopnea, n, v, dizziness, syncope, edema, weight gain, or early satiety.  All other systems reviewed and are otherwise negative except as noted above.  Physical Exam    VS:  BP (!) 150/82 (BP Location: Left Arm, Patient Position: Sitting, Cuff Size: Normal)   Pulse 72   Ht 5\' 8"  (  1.727 m)   Wt 217 lb 8 oz (98.7 kg)   BMI 33.07 kg/m  , BMI Body mass index is 33.07 kg/m. GEN: Well nourished, well developed, in no acute distress.  HEENT: normal.  Neck: Supple, no JVD, carotid bruits, or masses. Cardiac: RRR, no murmurs, rubs, or gallops. No clubbing, cyanosis, edema.  Radials/DP/PT 2+ and equal bilaterally.  Respiratory:  Respirations regular and unlabored, clear to auscultation bilaterally. GI: Soft, nontender, nondistended, BS + x 4. MS: no deformity or atrophy. Skin: warm and dry, no rash. Neuro:  Strength and sensation are intact. Psych: Normal affect.  Accessory Clinical Findings    ECG - RSR, 72, LVH, lateral TWI - no acute changes.  Assessment & Plan    1.  CAD:  S/p out of hospital MI in 2013.  MV @ that time with lateral infarct, no ischemia.  He has been conservatively managed since.  He has done well without c/p or dyspnea over the past 6 mos.  He remains on  blocker, nitrate, and statin therapy.  No ASA in the setting of eliquis therapy.  2.  Chronic systolic CHF/ICM:  EF 13% by echo in 2014.  Euvolemic on exam today.  Doing well @ home w/o significant dyspnea.  Wts stable.  Cont  blocker, hydral/nitrate, spiro.  No acei/arb/entresto in the setting of CKD III.  3.  Hypertensive Heart Dzs:  bp elevated today.  He did not take his  bp meds yet this am.  He will go home and take them.  4.  HL:  On simva.  This is followed by primary care.  5.  Claudication:  He notes bilat leg pain after walking about 1/4 mile.  Describes it as more sharp and shooting.  ABI's last summer - 0.86 R, 0.96 L.  Bilateral Fem arterial dzs w/o significant stenosis.  Followed by vascular surgery.    6.  H/O DVT:  On eliquis.  7.  Dispo:  F/u in 6 mos.   Murray Hodgkins, NP 03/21/2016, 11:29 AM

## 2016-03-21 NOTE — Patient Instructions (Signed)
Medication Instructions:  Your physician recommends that you continue on your current medications as directed. Please refer to the Current Medication list given to you today. Nitro refill has been submitted to your pharmacy.  Labwork: none  Testing/Procedures: none  Follow-Up: Your physician wants you to follow-up in: six months with Dr. Fletcher Anon.  You will receive a reminder letter in the mail two months in advance. If you don't receive a letter, please call our office to schedule the follow-up appointment.   Any Other Special Instructions Will Be Listed Below (If Applicable).     If you need a refill on your cardiac medications before your next appointment, please call your pharmacy.

## 2016-03-24 DIAGNOSIS — I10 Essential (primary) hypertension: Secondary | ICD-10-CM | POA: Diagnosis not present

## 2016-03-24 DIAGNOSIS — E7800 Pure hypercholesterolemia, unspecified: Secondary | ICD-10-CM | POA: Diagnosis not present

## 2016-03-26 DIAGNOSIS — Z6830 Body mass index (BMI) 30.0-30.9, adult: Secondary | ICD-10-CM | POA: Diagnosis not present

## 2016-03-26 DIAGNOSIS — M5136 Other intervertebral disc degeneration, lumbar region: Secondary | ICD-10-CM | POA: Diagnosis not present

## 2016-03-26 DIAGNOSIS — M48061 Spinal stenosis, lumbar region without neurogenic claudication: Secondary | ICD-10-CM | POA: Diagnosis not present

## 2016-03-26 DIAGNOSIS — K219 Gastro-esophageal reflux disease without esophagitis: Secondary | ICD-10-CM | POA: Diagnosis not present

## 2016-03-26 DIAGNOSIS — Z299 Encounter for prophylactic measures, unspecified: Secondary | ICD-10-CM | POA: Diagnosis not present

## 2016-03-26 DIAGNOSIS — R937 Abnormal findings on diagnostic imaging of other parts of musculoskeletal system: Secondary | ICD-10-CM | POA: Diagnosis not present

## 2016-03-26 DIAGNOSIS — Z713 Dietary counseling and surveillance: Secondary | ICD-10-CM | POA: Diagnosis not present

## 2016-03-26 DIAGNOSIS — M545 Low back pain: Secondary | ICD-10-CM | POA: Diagnosis not present

## 2016-03-26 DIAGNOSIS — M25561 Pain in right knee: Secondary | ICD-10-CM | POA: Diagnosis not present

## 2016-03-29 ENCOUNTER — Encounter (HOSPITAL_COMMUNITY): Payer: Self-pay | Admitting: Emergency Medicine

## 2016-03-29 ENCOUNTER — Emergency Department (HOSPITAL_BASED_OUTPATIENT_CLINIC_OR_DEPARTMENT_OTHER): Payer: Medicare Other

## 2016-03-29 ENCOUNTER — Emergency Department (HOSPITAL_COMMUNITY)
Admission: EM | Admit: 2016-03-29 | Discharge: 2016-03-29 | Disposition: A | Discharge status: Home / Self Care | Payer: Medicare Other | Attending: Emergency Medicine | Admitting: Emergency Medicine

## 2016-03-29 DIAGNOSIS — M25551 Pain in right hip: Secondary | ICD-10-CM | POA: Insufficient documentation

## 2016-03-29 DIAGNOSIS — I129 Hypertensive chronic kidney disease with stage 1 through stage 4 chronic kidney disease, or unspecified chronic kidney disease: Secondary | ICD-10-CM | POA: Insufficient documentation

## 2016-03-29 DIAGNOSIS — N183 Chronic kidney disease, stage 3 (moderate): Secondary | ICD-10-CM | POA: Diagnosis not present

## 2016-03-29 DIAGNOSIS — Z87891 Personal history of nicotine dependence: Secondary | ICD-10-CM | POA: Diagnosis not present

## 2016-03-29 DIAGNOSIS — I252 Old myocardial infarction: Secondary | ICD-10-CM | POA: Diagnosis not present

## 2016-03-29 DIAGNOSIS — I2510 Atherosclerotic heart disease of native coronary artery without angina pectoris: Secondary | ICD-10-CM | POA: Insufficient documentation

## 2016-03-29 DIAGNOSIS — Z7901 Long term (current) use of anticoagulants: Secondary | ICD-10-CM | POA: Diagnosis not present

## 2016-03-29 DIAGNOSIS — M79609 Pain in unspecified limb: Secondary | ICD-10-CM | POA: Diagnosis not present

## 2016-03-29 DIAGNOSIS — Z79899 Other long term (current) drug therapy: Secondary | ICD-10-CM | POA: Insufficient documentation

## 2016-03-29 LAB — URINALYSIS, ROUTINE W REFLEX MICROSCOPIC
Bacteria, UA: NONE SEEN
Bilirubin Urine: NEGATIVE
GLUCOSE, UA: NEGATIVE mg/dL
Hgb urine dipstick: NEGATIVE
KETONES UR: NEGATIVE mg/dL
Leukocytes, UA: NEGATIVE
Nitrite: NEGATIVE
Protein, ur: 30 mg/dL — AB
Specific Gravity, Urine: 1.018 (ref 1.005–1.030)
Squamous Epithelial / HPF: NONE SEEN
pH: 5 (ref 5.0–8.0)

## 2016-03-29 MED ORDER — OXYCODONE-ACETAMINOPHEN 5-325 MG PO TABS
1.0000 | ORAL_TABLET | Freq: Once | ORAL | Status: AC
  Administered 2016-03-29: 1 via ORAL
  Filled 2016-03-29: qty 1

## 2016-03-29 NOTE — ED Notes (Signed)
Pt returned from Vascular. 

## 2016-03-29 NOTE — ED Notes (Signed)
Pt comfortable with d/c and f/u instructions. Rx x0 

## 2016-03-29 NOTE — ED Notes (Signed)
Pt in bathroom

## 2016-03-29 NOTE — ED Notes (Signed)
Patient transported to US 

## 2016-03-29 NOTE — ED Provider Notes (Signed)
[facility] DEPT Provider Note   CSN: [id number] Arrival date & time: 03/29/16  0228     History   Chief Complaint Chief Complaint  Patient presents with  . Hip Pain  . Leg Pain  . Back Pain    HPI Glenn Hawkins is a 61 y.o. male.  Patient with a history of CKD (stage 3, baseline Cr 2.1), HTN, HLD, claudication, DVT (right, on Eliquis), CAD presents with pain in the right flank area, right hip/groin and right knee. His pain is chronic but he reports it is significantly worse tonight and uncontrolled with home medications. No urinary or bowel incontinence, complaint of abdominal pain, urinary frequency or dysuria. No fever or extremity edema.    The history is provided by the patient. No language interpreter was used.  Hip Pain  Pertinent negatives include no chest pain, no abdominal pain and no shortness of breath.  Leg Pain   Pertinent negatives include no numbness.  Back Pain   Associated symptoms include leg pain. Pertinent negatives include no chest pain, no fever, no numbness, no abdominal pain, no dysuria and no weakness.    Past Medical History:  Diagnosis Date  . CKD (chronic kidney disease) stage 3, GFR 30-59 ml/min    baseline Cr around 2.5  . Claudication ([place])    a. 08/2015 ABI/duplex: R: 0.86, L 0.96. Duplex w/ bilateral heterogeneous plaque in mid fem arteries, no significant stenoses.  . Coronary atherosclerosis of native coronary artery    a. 02/2011 Late presentation MI-->Myoview w/ large area of transmural infarct in LCX distribution, no ischemia.  . Depressive disorder, not elsewhere classified   . Esophageal reflux   . Gout, unspecified   . Hemorrhoids    a. 01/2016 s/p hemorrhoidectomy.  [name] History of DVT (deep vein thrombosis)    a. Chronic Eliquis.  . Hypertension   . Hypotension, unspecified   . Ischemic cardiomyopathy    a. 04/2012 Echo: EF 40%, inflat AK, Gr1 DD, mild AI/MR, triv TR.  . 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 04/2012 Echo: EF  40%, mild MR.  . Myocardial infarction (lateral wall) ([place]) 2013   a. 02/2011 - late presentation. Managed conservatively 2/2 CKD;  b. 02/2011 Myoview:  Large transmural infarct in the LCX distribution, no ischemia.  . Other and unspecified hyperlipidemia   . Personal history of tobacco use, presenting hazards to health   . Torn ligament   . Unspecified schizophrenia, unspecified condition     Patient Active Problem List   Diagnosis Date Noted  . Hemorrhoids 04/18/2015  . Colon polyps 04/18/2015  . Rectal bleed 11/08/2014  . CKD (chronic kidney disease) stage 3, GFR 30-59 ml/min 11/08/2014  . Hyperglycemia 11/08/2014  . GERD without esophagitis 11/08/2014  . Obesity 11/08/2014  . Rectal bleeding 11/08/2014  . Acute deep vein thrombosis (DVT) of femoral vein of right lower extremity ([place]) 10/18/2014  . Chronic systolic heart failure ([place]) 12/09/2013  . Cardiomyopathy, ischemic 06/07/2013  . Mitral valve disorder   . Coronary atherosclerosis of native coronary artery   . Hypertension   . Hyperlipidemia     Past Surgical History:  Procedure Laterality Date  . COLONOSCOPY    . COLONOSCOPY N/A 11/10/2014   Procedure: COLONOSCOPY;  Surgeon: Manus Gunning, MD;  Location: [facility] ENDOSCOPY;  Service: Gastroenterology;  Laterality: N/A;  . COLONOSCOPY WITH PROPOFOL N/A 05/08/2015   Procedure: COLONOSCOPY WITH PROPOFOL;  Surgeon: [name], MD;  Location: [facility] ENDO SUITE;  Service: Endoscopy;  Laterality: N/A;  1215 - office states unable to move patient  . ESOPHAGOGASTRODUODENOSCOPY (EGD) WITH PROPOFOL N/A 05/08/2015   Procedure: ESOPHAGOGASTRODUODENOSCOPY (EGD) WITH PROPOFOL;  Surgeon: [name], MD;  Location: [facility] ENDO SUITE;  Service: Endoscopy;  Laterality: N/A;  . EYE SURGERY Right    removal of foreign body  . HEMORRHOID SURGERY N/A 02/20/2016   Procedure: EXTENSIVE HEMORRHOIDECTOMY;  Surgeon: [name], MD;  Location: [facility] ORS;  Service: General;  Laterality: N/A;  .  None    . POLYPECTOMY  05/08/2015   Procedure: POLYPECTOMY;  Surgeon: [name], MD;  Location: [facility] ENDO SUITE;  Service: Endoscopy;;  transverse colon polyp       Home Medications    Prior to Admission medications   Medication Sig Start Date End Date Taking? Authorizing Provider  amLODipine (NORVASC) 10 MG tablet Take 10 mg by mouth daily.    Historical Provider, MD  apixaban (ELIQUIS) 5 MG TABS tablet Take 1 tablet (5 mg total) by mouth 2 (two) times daily. 09/21/15   Wellington Hampshire, MD  ARIPiprazole (ABILIFY) 20 MG tablet Take 20 mg by mouth 2 (two) times daily.    Historical Provider, MD  buPROPion (WELLBUTRIN) 100 MG tablet Take 200 mg by mouth 2 (two) times daily. Takes 2 tablets at bedtime    Historical Provider, MD  COLCRYS 0.6 MG tablet Take 0.6 mg by mouth every evening.  05/22/15   Historical Provider, MD  docusate sodium (COLACE) 100 MG capsule Take 1 capsule (100 mg total) by mouth 2 (two) times daily. Patient taking differently: Take 200 mg by mouth daily as needed for mild constipation.  11/10/14   Donne Hazel, MD  hydrALAZINE (APRESOLINE) 100 MG tablet Take 100 mg by mouth 3 (three) times daily.     Historical Provider, MD  isosorbide mononitrate (IMDUR) 60 MG 24 hr tablet TAKE 1 TABLET EVERY MORNING 10/01/15   Wellington Hampshire, MD  LORazepam (ATIVAN) 1 MG tablet Take 2 mg by mouth at bedtime.     Historical Provider, MD  metoprolol (LOPRESSOR) 100 MG tablet Take 100 mg by mouth 2 (two) times daily.     Historical Provider, MD  nitroGLYCERIN (NITROSTAT) 0.4 MG SL tablet Place 1 tablet (0.4 mg total) under the tongue every 5 (five) minutes as needed for chest pain. 03/21/16   Wellington Hampshire, MD  oxyCODONE-acetaminophen (PERCOCET) 7.5-325 MG tablet Take 1 tablet by mouth every 4 (four) hours as needed. 02/20/16   [name], MD  QUEtiapine (SEROQUEL) 400 MG tablet Take 400 mg by mouth at bedtime.    Historical Provider, MD  sertraline (ZOLOFT) 100 MG tablet Take 200 mg  by mouth daily. Takes 2 tablets daily    Historical Provider, MD  sildenafil (VIAGRA) 50 MG tablet Take 12.5 mg by mouth daily as needed for erectile dysfunction. Takes 1/4 of a tablet when needed    Historical Provider, MD  simvastatin (ZOCOR) 40 MG tablet Take 1 tablet (40 mg total) by mouth at bedtime. 04/12/15   Wellington Hampshire, MD  spironolactone (ALDACTONE) 25 MG tablet Take 50 mg by mouth at bedtime. Takes 2 tablets at bedtime    Historical Provider, MD    Family History Family History  Problem Relation Age of Onset  . Colon cancer Neg Hx     Social History Social History  Substance Use Topics  . Smoking status: Former Smoker    Packs/day: 1.00    Years: 23.00  Types: Cigarettes    Quit date: 02/25/1996  . Smokeless tobacco: Never Used     Comment: + 15 years of smoking  . Alcohol use No     Allergies   Nifedipine; Rabeprazole; Benazepril; Haloperidol and related; and Omeprazole   Review of Systems Review of Systems  Constitutional: Negative for fever.  Respiratory: Negative for shortness of breath.   Cardiovascular: Negative for chest pain.  Gastrointestinal: Negative for abdominal pain.  Genitourinary: Negative.  Negative for dysuria, enuresis and frequency.  Musculoskeletal: Positive for back pain.       Hip and knee pain, right.  Skin: Negative for color change.  Neurological: Negative for weakness and numbness.     Physical Exam Updated Vital Signs BP 167/87 (BP Location: Right Arm)   Pulse 81   Temp 98.1 F (36.7 C) (Oral)   Resp 18   Ht 5\' 8"  (1.727 m)   Wt 99.8 kg   SpO2 100%   BMI 33.45 kg/m   Physical Exam  Constitutional: He is oriented to person, place, and time. He appears well-developed and well-nourished.  Neck: Normal range of motion.  Cardiovascular: Normal rate and intact distal pulses.   Pulmonary/Chest: Effort normal.  Abdominal: There is no tenderness.  Musculoskeletal: Normal range of motion. He exhibits no edema.  Right hip  non-tender. Mild inguinal tenderness without palpable mass or swelling.  Neurological: He is alert and oriented to person, place, and time.  Skin: Skin is warm and dry.  Psychiatric: He has a normal mood and affect.     ED Treatments / Results  Labs (all labs ordered are listed, but only abnormal results are displayed) Labs Reviewed - No data to display  EKG  EKG Interpretation None       Radiology No results found.  Procedures Procedures (including critical care time)  Medications Ordered in ED Medications  oxyCODONE-acetaminophen (PERCOCET/ROXICET) 5-325 MG per tablet 1 tablet (not administered)     Initial Impression / Assessment and Plan / ED Course  I have reviewed the triage vital signs and the nursing notes.  Pertinent labs & imaging results that were available during my care of the patient were reviewed by me and considered in my medical decision making (see chart for details).     Patient with right hip and knee pain. Suspect musculoskeletal, however, patient has history of DVT in right leg. Pain is improved with Percocet currently. Distal pulses intact. Will obtain venous doppler to evaluate clotting.   Patient care signed out to oncoming provider pending doppler study results for appropriate disposition.   Final Clinical Impressions(s) / ED Diagnoses   Final diagnoses:  None   1. Right hip pain  New Prescriptions New Prescriptions   No medications on file     Charlann Lange, Hershal Coria 04/09/16 0014    Fatima Blank, MD 04/09/16 [phone]

## 2016-03-29 NOTE — Discharge Instructions (Signed)
Please follow up with Dr. Oneida Alar with Vascular for follow up Consider making appointment with Dr. Alvan Dame who is a orthopedics doctor to evaluate hip pain as well

## 2016-03-29 NOTE — ED Provider Notes (Signed)
Patient signed out to me by S Upstill PA-C. Patient is a 61 year old male with R hip and groin pain. Pain better with Percocet in ED. He has a known DVT in R leg. Followed by Dr. Oneida Alar with Vascular - last seen in July. No significant stenosis with prior duplex doppler. Pan: Awaiting DVT study. If negative - d/c with vascular f/u. If DVT is worse --->admit  8:27 AM There is no DVT noted in RLE and there is adequate blood flow in the right common femoral artery. On review of CT of abdomen and pelvis in 2016 there is mention of chronic avascular necrosis of the right femoral head and degenerative changes at L4-L5. This is possibly the origin of his pain. Discussed results with patient. Will refer him back to Dr. Oneida Alar and also will give ortho referral. Patient has Percocet prescribed to him already. Will d/c.   Recardo Evangelist, PA-C 03/29/16 [place], PA-C 03/29/16 [id number]    Fatima Blank, MD 03/30/16 [phone]

## 2016-03-29 NOTE — ED Notes (Signed)
Pt presents with R sided pain in back, hip, and leg.  Pt sts the pain got worse today. Went to [facility] Thursday and received hydrocodone, after receiving cortisone shot in knee.  Some relief with meds.Took hydrocodone at approximately 11 pm last night (Friday).

## 2016-03-29 NOTE — ED Triage Notes (Signed)
C/o pain to R lower back and R hip that radiates down R leg since yesterday.  States he was told he had a blockage in his leg/groin that is causing the pain but it has been worse the past 2 days.  Pt ambulatory with cane.

## 2016-03-29 NOTE — Progress Notes (Signed)
VASCULAR LAB PRELIMINARY  PRELIMINARY  PRELIMINARY  PRELIMINARY  Right lower extremity venous duplex completed.    Preliminary report:  There is no DVT or SVT noted in the right lower extremity.  Incidentally, there is adequate arterial flow noted in the right common femoral artery.  Called report to Dr. Loney Laurence, [facility], RVT 03/29/2016, 8:16 AM

## 2016-04-07 DIAGNOSIS — M5416 Radiculopathy, lumbar region: Secondary | ICD-10-CM | POA: Diagnosis not present

## 2016-04-07 DIAGNOSIS — M545 Low back pain: Secondary | ICD-10-CM | POA: Diagnosis not present

## 2016-04-07 DIAGNOSIS — Z299 Encounter for prophylactic measures, unspecified: Secondary | ICD-10-CM | POA: Diagnosis not present

## 2016-04-07 DIAGNOSIS — N189 Chronic kidney disease, unspecified: Secondary | ICD-10-CM | POA: Diagnosis not present

## 2016-04-07 DIAGNOSIS — J069 Acute upper respiratory infection, unspecified: Secondary | ICD-10-CM | POA: Diagnosis not present

## 2016-04-07 DIAGNOSIS — Z6829 Body mass index (BMI) 29.0-29.9, adult: Secondary | ICD-10-CM | POA: Diagnosis not present

## 2016-04-09 DIAGNOSIS — M545 Low back pain: Secondary | ICD-10-CM | POA: Diagnosis not present

## 2016-04-09 DIAGNOSIS — R938 Abnormal findings on diagnostic imaging of other specified body structures: Secondary | ICD-10-CM | POA: Diagnosis not present

## 2016-04-09 DIAGNOSIS — M9983 Other biomechanical lesions of lumbar region: Secondary | ICD-10-CM | POA: Diagnosis not present

## 2016-04-09 DIAGNOSIS — M47816 Spondylosis without myelopathy or radiculopathy, lumbar region: Secondary | ICD-10-CM | POA: Diagnosis not present

## 2016-04-09 DIAGNOSIS — M48061 Spinal stenosis, lumbar region without neurogenic claudication: Secondary | ICD-10-CM | POA: Diagnosis not present

## 2016-04-15 DIAGNOSIS — I129 Hypertensive chronic kidney disease with stage 1 through stage 4 chronic kidney disease, or unspecified chronic kidney disease: Secondary | ICD-10-CM | POA: Diagnosis not present

## 2016-04-15 DIAGNOSIS — M48061 Spinal stenosis, lumbar region without neurogenic claudication: Secondary | ICD-10-CM | POA: Diagnosis not present

## 2016-04-15 DIAGNOSIS — F200 Paranoid schizophrenia: Secondary | ICD-10-CM | POA: Diagnosis not present

## 2016-04-15 DIAGNOSIS — J45909 Unspecified asthma, uncomplicated: Secondary | ICD-10-CM | POA: Diagnosis not present

## 2016-04-15 DIAGNOSIS — I5022 Chronic systolic (congestive) heart failure: Secondary | ICD-10-CM | POA: Diagnosis not present

## 2016-04-15 DIAGNOSIS — N184 Chronic kidney disease, stage 4 (severe): Secondary | ICD-10-CM | POA: Diagnosis not present

## 2016-04-15 DIAGNOSIS — I429 Cardiomyopathy, unspecified: Secondary | ICD-10-CM | POA: Diagnosis not present

## 2016-04-15 DIAGNOSIS — R0602 Shortness of breath: Secondary | ICD-10-CM | POA: Diagnosis not present

## 2016-04-15 DIAGNOSIS — Z299 Encounter for prophylactic measures, unspecified: Secondary | ICD-10-CM | POA: Diagnosis not present

## 2016-04-16 DIAGNOSIS — I10 Essential (primary) hypertension: Secondary | ICD-10-CM | POA: Diagnosis not present

## 2016-04-16 DIAGNOSIS — E7800 Pure hypercholesterolemia, unspecified: Secondary | ICD-10-CM | POA: Diagnosis not present

## 2016-04-18 DIAGNOSIS — M25561 Pain in right knee: Secondary | ICD-10-CM | POA: Diagnosis not present

## 2016-04-18 DIAGNOSIS — I10 Essential (primary) hypertension: Secondary | ICD-10-CM | POA: Diagnosis not present

## 2016-04-18 DIAGNOSIS — M25551 Pain in right hip: Secondary | ICD-10-CM | POA: Diagnosis not present

## 2016-04-25 ENCOUNTER — Encounter: Payer: Self-pay | Admitting: Vascular Surgery

## 2016-04-25 DIAGNOSIS — I7409 Other arterial embolism and thrombosis of abdominal aorta: Secondary | ICD-10-CM | POA: Diagnosis not present

## 2016-04-25 DIAGNOSIS — M898X5 Other specified disorders of bone, thigh: Secondary | ICD-10-CM | POA: Diagnosis not present

## 2016-04-25 DIAGNOSIS — R0989 Other specified symptoms and signs involving the circulatory and respiratory systems: Secondary | ICD-10-CM | POA: Diagnosis not present

## 2016-04-25 DIAGNOSIS — M87051 Idiopathic aseptic necrosis of right femur: Secondary | ICD-10-CM | POA: Diagnosis not present

## 2016-04-25 DIAGNOSIS — M25461 Effusion, right knee: Secondary | ICD-10-CM | POA: Diagnosis not present

## 2016-04-25 DIAGNOSIS — M87052 Idiopathic aseptic necrosis of left femur: Secondary | ICD-10-CM | POA: Diagnosis not present

## 2016-04-29 ENCOUNTER — Other Ambulatory Visit: Payer: Self-pay | Admitting: Cardiovascular Disease

## 2016-04-30 ENCOUNTER — Other Ambulatory Visit: Payer: Self-pay | Admitting: *Deleted

## 2016-04-30 MED ORDER — APIXABAN 5 MG PO TABS: 5.0000 mg | ORAL_TABLET | Freq: Two times a day (BID) | ORAL | 3 refills | Status: DC

## 2016-05-08 ENCOUNTER — Encounter: Payer: Self-pay | Admitting: Vascular Surgery

## 2016-05-08 ENCOUNTER — Ambulatory Visit (HOSPITAL_COMMUNITY)
Admission: RE | Admit: 2016-05-08 | Discharge: 2016-05-08 | Disposition: A | Discharge status: Home / Self Care | Payer: Medicare Other | Source: Ambulatory Visit | Attending: Vascular Surgery | Admitting: Vascular Surgery

## 2016-05-08 ENCOUNTER — Ambulatory Visit (INDEPENDENT_AMBULATORY_CARE_PROVIDER_SITE_OTHER): Payer: Medicare Other | Admitting: Vascular Surgery

## 2016-05-08 VITALS — BP 140/90 | HR 83 | Temp 98.1°F | Resp 21 | Ht 68.0 in | Wt 214.6 lb

## 2016-05-08 DIAGNOSIS — I739 Peripheral vascular disease, unspecified: Secondary | ICD-10-CM | POA: Diagnosis not present

## 2016-05-08 DIAGNOSIS — I255 Ischemic cardiomyopathy: Secondary | ICD-10-CM | POA: Diagnosis not present

## 2016-05-08 NOTE — Progress Notes (Signed)
[facility] [place] [facility]   Patient name: Glenn Hawkins          MRN: [id number]        DOB: August 10, 1955            Sex: male   REASON FOR VISIT: Follow-up claudication   HPI: Glenn Hawkins is a 61 y.o. male who presents for evaluation of his leg pain with walking. This has been present for 4-5 years. He was seen about 6 months ago for the same complaint. His ABIs were 0.9 bilaterally with palpable dorsalis pedis pulses bilaterally. He has chronic kidney disease with baseline serum creatinine of 2.6 from March 2017. He was advised to start a walking program with follow up in 6 months.    He is back today because his symptoms are continuing to bother him but mainly the right leg.  He has pain in the entire right leg with the first step out of bed or with sitting.  He is in the middle of a back pain workup. He is a former smoker, quitting many years ago. He has history of DVT on Eliquis and CAD. He is not diabetic.   He has a history of sciatica. He occasionally gets back pain with mowing the lawn and using the weed eater. However, on a day-to-day basis he denies back pain.        Past Medical History  Diagnosis Date  . Acute myocardial infarction, subendocardial infarction, initial episode of care ([place])    . Other primary cardiomyopathies    . Esophageal reflux    . Depressive disorder, not elsewhere classified    . Gout, unspecified    . Unspecified schizophrenia, unspecified condition    . Encounter for long-term (current) use of other medications    . Personal history of tobacco use, presenting hazards to health    . Hypotension, unspecified    . Mitral valve disorders        moderate MR  . Coronary atherosclerosis of native coronary artery    . Hypertension    . Other and unspecified hyperlipidemia    . Torn ligament    . Ischemic cardiomyopathy    . Hx of echocardiogram 2014      EF 40%  . CKD (chronic kidney disease) stage 3, GFR 30-59 ml/min     baseline Cr around 2.5  . History of nuclear stress test 2013      large transmural infarct in the left circumflex distribution without significant ischemia  . CHF (congestive heart failure) ([place])    . History of DVT (deep vein thrombosis)             Family History  Problem Relation Age of Onset  . Colon cancer Neg Hx        SOCIAL HISTORY:       Social History  Substance Use Topics  . Smoking status: Former Smoker -- 1.00 packs/day for 23 years      Types: Cigarettes      Quit date: 02/25/1996  . Smokeless tobacco: Never Used        Comment: + 15 years of smoking  . Alcohol Use: No          Allergies  Allergen Reactions  . Nifedipine Diarrhea  . Benazepril Other (See Comments)  . Haloperidol And Related Other (See Comments)  . Omeprazole Other (See Comments)      Current Outpatient Prescriptions on File Prior to Visit  Medication Sig Dispense Refill  . amLODipine (NORVASC) 10 MG tablet Take 10 mg by mouth daily.    [name] apixaban (ELIQUIS) 5 MG TABS tablet Take 1 tablet (5 mg total) by mouth 2 (two) times daily. 180 tablet 3  . ARIPiprazole (ABILIFY) 20 MG tablet Take 20 mg by mouth 2 (two) times daily.    [name] buPROPion (WELLBUTRIN) 100 MG tablet Take 200 mg by mouth 2 (two) times daily. Takes 2 tablets at bedtime    . COLCRYS 0.6 MG tablet Take 0.6 mg by mouth every evening.     . docusate sodium (COLACE) 100 MG capsule Take 1 capsule (100 mg total) by mouth 2 (two) times daily. (Patient taking differently: Take 200 mg by mouth daily as needed for mild constipation. ) 10 capsule 0  . esomeprazole (NEXIUM) 20 MG capsule Take 20 mg by mouth daily as needed (gurd).     . hydrALAZINE (APRESOLINE) 100 MG tablet Take 100 mg by mouth 3 (three) times daily.     . isosorbide mononitrate (IMDUR) 60 MG 24 hr tablet TAKE 1 TABLET EVERY MORNING 90 tablet 3  . LORazepam (ATIVAN) 1 MG tablet Take 2 mg by mouth at bedtime.     . metoprolol (LOPRESSOR) 100 MG tablet Take 100 mg by mouth 2  (two) times daily.     . nitroGLYCERIN (NITROSTAT) 0.4 MG SL tablet Place 1 tablet (0.4 mg total) under the tongue every 5 (five) minutes as needed for chest pain. 25 tablet 3  . oxyCODONE-acetaminophen (PERCOCET) 7.5-325 MG tablet Take 1 tablet by mouth every 4 (four) hours as needed. (Patient taking differently: Take 1 tablet by mouth every 4 (four) hours as needed for moderate pain. ) 50 tablet 0  . QUEtiapine (SEROQUEL) 400 MG tablet Take 400 mg by mouth at bedtime.    . sertraline (ZOLOFT) 100 MG tablet Take 200 mg by mouth daily. Takes 2 tablets daily    . sildenafil (VIAGRA) 50 MG tablet Take 12.5 mg by mouth daily as needed for erectile dysfunction. Takes 1/4 of a tablet when needed    . simvastatin (ZOCOR) 40 MG tablet Take 1 tablet (40 mg total) by mouth at bedtime. 90 tablet 3  . spironolactone (ALDACTONE) 25 MG tablet Take 50 mg by mouth at bedtime. Takes 2 tablets at bedtime     No current facility-administered medications on file prior to visit.      REVIEW OF SYSTEMS:  [X]  denotes positive finding, [ ]  denotes negative finding Cardiac   Comments:  Chest pain or chest pressure: x    Shortness of breath upon exertion:      Short of breath when lying flat:      Irregular heart rhythm:             Vascular      Pain in calf, thigh, or hip brought on by ambulation:      Pain in feet at night that wakes you up from your sleep:       Blood clot in your veins: x    Leg swelling:              Pulmonary      Oxygen at home:      Productive cough:       Wheezing:              Neurologic      Sudden weakness in arms or legs:       Sudden numbness in  arms or legs:       Sudden onset of difficulty speaking or slurred speech:      Temporary loss of vision in one eye:       Problems with dizziness:              Gastrointestinal      Blood in stool:       Vomited blood:              Genitourinary      Burning when urinating:       Blood in urine:             Psychiatric       Major depression:  x           Hematologic      Bleeding problems:      Problems with blood clotting too easily:             Skin      Rashes or ulcers:             Constitutional      Fever or chills:          PHYSICAL EXAM:  Vitals:   05/08/16 1119  BP: 140/90  Pulse: 83  Resp: (!) 21  Temp: 98.1 F (36.7 C)  TempSrc: Oral  SpO2: 98%  Weight: 214 lb 9.6 oz (97.3 kg)  Height: 5\' 8"  (1.727 m)     GENERAL: The patient is a well-nourished male, in no acute distress. VASCULAR: 1-2+ femoral pulses bilaterally. 2+ dorsalis pedis pulses bilaterally. Absent right PT 2+ left PT  PULMONARY: Nonlabored respiratory effort. MUSCULOSKELETAL: There are no major deformities or cyanosis. NEUROLOGIC: No focal weakness or paresthesias are detected. SKIN: There are no ulcers or rashes noted. PSYCHIATRIC: The patient has a normal affect.   DATA:  Lower extremity arterial duplex 09/04/2015. Patent lower extremity arteries bilaterally. Biphasic waveforms bilaterally.   ABIs 09/04/2015 Right 0.86 with biphasic posterior tibial waveforms and triphasic dorsalis pedis waveforms. TBI 0.95 Left 0.96 with biphasic posterior tibial waveforms and triphasic dorsalis pedis waveforms. TBI 1.11  ABIs today 0.8 bilaterally   MEDICAL ISSUES: Right leg pain   Patient does have lower extremity pain with walking in both legs R > L. He does have essentially normal ABIs with palpable pulses in his feet. He probably does have some element of peripheral arterial disease but may also have some element of spinal stenosis as well. He does not want to proceed with arteriogram because he does not want to risk injury to his kidneys which I can certainly appreciate. He certainly is not at risk of limb loss at this point. He will continue trying to walk 30 minutes daily. He will follow-up with me as needed if he develops classic claudcation symptoms with calf pain or non healing wounds on the feet.  Otherwise no  further workup for his very mild PAD.   Ruta Hinds, MD [facility] [place] Office: [phone] Pager: [phone]

## 2016-05-13 ENCOUNTER — Telehealth: Payer: Self-pay | Admitting: Cardiovascular Disease

## 2016-05-13 DIAGNOSIS — M545 Low back pain: Secondary | ICD-10-CM | POA: Diagnosis not present

## 2016-05-13 DIAGNOSIS — M48061 Spinal stenosis, lumbar region without neurogenic claudication: Secondary | ICD-10-CM | POA: Diagnosis not present

## 2016-05-13 DIAGNOSIS — M5416 Radiculopathy, lumbar region: Secondary | ICD-10-CM | POA: Diagnosis not present

## 2016-05-13 NOTE — Telephone Encounter (Signed)
Pt states he is having back pain, and is currently seeing a pain doctor, Dr. Skip Mayer and will eventually be starting on steroids. Pt states he needs to be off his blood thinners before his pain doctor can proceed with treatment . Please call.

## 2016-05-13 NOTE — Telephone Encounter (Signed)
Pt seen today @ [facility]. They will fax cardiac clearance request to our office.

## 2016-05-13 NOTE — Telephone Encounter (Signed)
Notified pt that we are waiting for cardiac clearance and will call with MD recommendations. He verbalized understanding and is appreciative of the call.

## 2016-05-19 NOTE — Telephone Encounter (Signed)
Left detailed message on pt's cell VM. Pt has been cleared for lumbar eipidural steroid injection. Per MD, he may stop eliquis 3 days prior to injection. Faxed to [place], attn: xray, [phone]

## 2016-05-28 DIAGNOSIS — M48061 Spinal stenosis, lumbar region without neurogenic claudication: Secondary | ICD-10-CM | POA: Diagnosis not present

## 2016-05-28 DIAGNOSIS — M5416 Radiculopathy, lumbar region: Secondary | ICD-10-CM | POA: Diagnosis not present

## 2016-05-28 DIAGNOSIS — M545 Low back pain: Secondary | ICD-10-CM | POA: Diagnosis not present

## 2016-06-11 DIAGNOSIS — M545 Low back pain: Secondary | ICD-10-CM | POA: Diagnosis not present

## 2016-06-11 DIAGNOSIS — M5416 Radiculopathy, lumbar region: Secondary | ICD-10-CM | POA: Diagnosis not present

## 2016-06-11 DIAGNOSIS — M48061 Spinal stenosis, lumbar region without neurogenic claudication: Secondary | ICD-10-CM | POA: Diagnosis not present

## 2016-06-12 ENCOUNTER — Telehealth: Payer: Self-pay | Admitting: Cardiovascular Disease

## 2016-06-12 ENCOUNTER — Encounter: Payer: Self-pay | Admitting: Cardiovascular Disease

## 2016-06-12 NOTE — Telephone Encounter (Signed)
Returned call to patient. His doctor at [facility], Dr Ron Agee, will be giving him a "pain shot" for his sciatica and he will need to be off his Eliquis for 3 days.  Called Dr Carolin Coy office to request clearance. They will fax it over to [phone].

## 2016-06-12 NOTE — Telephone Encounter (Signed)
Pt states his pain Dr. [place] needs him to stop his blood thinner for 3 days before he gets another shot. Please call.

## 2016-06-12 NOTE — Telephone Encounter (Signed)
This encounter was created in error - please disregard.

## 2016-06-12 NOTE — Telephone Encounter (Signed)
Received clearance from [facility].  Requesting surgical clearance:   1. Type of surgery: Lumbar epidural steroid injection, right L4-5 and L5-S1  2. Surgeon: Dr Ron Agee  3. Surgical date: TBA  4. Medications that need to be held: Eliquis for 3 days     5. I will defer to: Dr Fletcher Anon  Fax to [place] Phone [phone]  [phone]

## 2016-06-13 NOTE — Telephone Encounter (Signed)
Ok to hold Eliquis 3 days before the procedure. 

## 2016-06-16 NOTE — Telephone Encounter (Signed)
Returned call to patient.  He verbalized understanding and will call [facility] to find out about scheduling for his injection.

## 2016-06-16 NOTE — Telephone Encounter (Signed)
Clearance routed to number provided.  Left detailed message with instructions on patient's voicemail, ok per DPR, and to call back if he has any questions.

## 2016-06-16 NOTE — Telephone Encounter (Signed)
Pt calling back  Please call

## 2016-06-18 ENCOUNTER — Other Ambulatory Visit: Payer: Self-pay | Admitting: Cardiovascular Disease

## 2016-06-19 DIAGNOSIS — I739 Peripheral vascular disease, unspecified: Secondary | ICD-10-CM | POA: Diagnosis not present

## 2016-06-19 DIAGNOSIS — E663 Overweight: Secondary | ICD-10-CM | POA: Diagnosis not present

## 2016-06-19 DIAGNOSIS — I5022 Chronic systolic (congestive) heart failure: Secondary | ICD-10-CM | POA: Diagnosis not present

## 2016-06-19 DIAGNOSIS — Z713 Dietary counseling and surveillance: Secondary | ICD-10-CM | POA: Diagnosis not present

## 2016-06-19 DIAGNOSIS — Z87891 Personal history of nicotine dependence: Secondary | ICD-10-CM | POA: Diagnosis not present

## 2016-06-19 DIAGNOSIS — I429 Cardiomyopathy, unspecified: Secondary | ICD-10-CM | POA: Diagnosis not present

## 2016-06-19 DIAGNOSIS — N184 Chronic kidney disease, stage 4 (severe): Secondary | ICD-10-CM | POA: Diagnosis not present

## 2016-06-19 DIAGNOSIS — Z299 Encounter for prophylactic measures, unspecified: Secondary | ICD-10-CM | POA: Diagnosis not present

## 2016-06-19 DIAGNOSIS — R5383 Other fatigue: Secondary | ICD-10-CM | POA: Diagnosis not present

## 2016-06-20 DIAGNOSIS — E7800 Pure hypercholesterolemia, unspecified: Secondary | ICD-10-CM | POA: Diagnosis not present

## 2016-06-20 DIAGNOSIS — I10 Essential (primary) hypertension: Secondary | ICD-10-CM | POA: Diagnosis not present

## 2016-07-02 DIAGNOSIS — M545 Low back pain: Secondary | ICD-10-CM | POA: Diagnosis not present

## 2016-07-02 DIAGNOSIS — M5416 Radiculopathy, lumbar region: Secondary | ICD-10-CM | POA: Diagnosis not present

## 2016-07-02 DIAGNOSIS — M48061 Spinal stenosis, lumbar region without neurogenic claudication: Secondary | ICD-10-CM | POA: Diagnosis not present

## 2016-08-11 DIAGNOSIS — E7800 Pure hypercholesterolemia, unspecified: Secondary | ICD-10-CM | POA: Diagnosis not present

## 2016-08-11 DIAGNOSIS — I10 Essential (primary) hypertension: Secondary | ICD-10-CM | POA: Diagnosis not present

## 2016-08-28 DIAGNOSIS — I129 Hypertensive chronic kidney disease with stage 1 through stage 4 chronic kidney disease, or unspecified chronic kidney disease: Secondary | ICD-10-CM | POA: Diagnosis not present

## 2016-08-28 DIAGNOSIS — M7989 Other specified soft tissue disorders: Secondary | ICD-10-CM | POA: Diagnosis not present

## 2016-08-28 DIAGNOSIS — N184 Chronic kidney disease, stage 4 (severe): Secondary | ICD-10-CM | POA: Diagnosis not present

## 2016-08-28 DIAGNOSIS — M109 Gout, unspecified: Secondary | ICD-10-CM | POA: Diagnosis not present

## 2016-08-28 DIAGNOSIS — I2510 Atherosclerotic heart disease of native coronary artery without angina pectoris: Secondary | ICD-10-CM | POA: Diagnosis not present

## 2016-08-28 DIAGNOSIS — I5022 Chronic systolic (congestive) heart failure: Secondary | ICD-10-CM | POA: Diagnosis not present

## 2016-08-28 DIAGNOSIS — N400 Enlarged prostate without lower urinary tract symptoms: Secondary | ICD-10-CM | POA: Diagnosis not present

## 2016-08-28 DIAGNOSIS — M79671 Pain in right foot: Secondary | ICD-10-CM | POA: Diagnosis not present

## 2016-08-28 DIAGNOSIS — F200 Paranoid schizophrenia: Secondary | ICD-10-CM | POA: Diagnosis not present

## 2016-08-28 DIAGNOSIS — I429 Cardiomyopathy, unspecified: Secondary | ICD-10-CM | POA: Diagnosis not present

## 2016-08-28 DIAGNOSIS — M7731 Calcaneal spur, right foot: Secondary | ICD-10-CM | POA: Diagnosis not present

## 2016-08-28 DIAGNOSIS — Z6829 Body mass index (BMI) 29.0-29.9, adult: Secondary | ICD-10-CM | POA: Diagnosis not present

## 2016-08-28 DIAGNOSIS — Z299 Encounter for prophylactic measures, unspecified: Secondary | ICD-10-CM | POA: Diagnosis not present

## 2016-09-05 DIAGNOSIS — E7800 Pure hypercholesterolemia, unspecified: Secondary | ICD-10-CM | POA: Diagnosis not present

## 2016-09-05 DIAGNOSIS — I10 Essential (primary) hypertension: Secondary | ICD-10-CM | POA: Diagnosis not present

## 2016-10-03 ENCOUNTER — Telehealth: Payer: Self-pay | Admitting: Cardiovascular Disease

## 2016-10-03 NOTE — Telephone Encounter (Signed)
Clearance to stop eliquis for lumbar epidural steroid injection in MD basket

## 2016-10-03 NOTE — Telephone Encounter (Signed)
Needs clearance for epidural injection for back . Fax [phone]

## 2016-10-03 NOTE — Telephone Encounter (Signed)
Male who answered home phone states pt is not there. Left message on pt's cell VM to contact the office.

## 2016-10-06 NOTE — Telephone Encounter (Signed)
Faxed clearance to [facility], [phone]

## 2016-10-15 DIAGNOSIS — E7800 Pure hypercholesterolemia, unspecified: Secondary | ICD-10-CM | POA: Diagnosis not present

## 2016-10-15 DIAGNOSIS — I10 Essential (primary) hypertension: Secondary | ICD-10-CM | POA: Diagnosis not present

## 2016-10-31 DIAGNOSIS — M48061 Spinal stenosis, lumbar region without neurogenic claudication: Secondary | ICD-10-CM | POA: Diagnosis not present

## 2016-10-31 DIAGNOSIS — M5416 Radiculopathy, lumbar region: Secondary | ICD-10-CM | POA: Diagnosis not present

## 2016-10-31 DIAGNOSIS — M545 Low back pain: Secondary | ICD-10-CM | POA: Diagnosis not present

## 2016-11-05 DIAGNOSIS — I10 Essential (primary) hypertension: Secondary | ICD-10-CM | POA: Diagnosis not present

## 2016-11-05 DIAGNOSIS — E7800 Pure hypercholesterolemia, unspecified: Secondary | ICD-10-CM | POA: Diagnosis not present

## 2016-11-07 ENCOUNTER — Ambulatory Visit: Payer: Medicare Other | Admitting: Cardiovascular Disease

## 2016-11-11 DIAGNOSIS — I2510 Atherosclerotic heart disease of native coronary artery without angina pectoris: Secondary | ICD-10-CM | POA: Diagnosis not present

## 2016-11-11 DIAGNOSIS — K649 Unspecified hemorrhoids: Secondary | ICD-10-CM | POA: Diagnosis not present

## 2016-11-11 DIAGNOSIS — M109 Gout, unspecified: Secondary | ICD-10-CM | POA: Diagnosis not present

## 2016-11-11 DIAGNOSIS — K5900 Constipation, unspecified: Secondary | ICD-10-CM | POA: Diagnosis not present

## 2016-11-11 DIAGNOSIS — N184 Chronic kidney disease, stage 4 (severe): Secondary | ICD-10-CM | POA: Diagnosis not present

## 2016-11-11 DIAGNOSIS — E7800 Pure hypercholesterolemia, unspecified: Secondary | ICD-10-CM | POA: Diagnosis not present

## 2016-11-11 DIAGNOSIS — I429 Cardiomyopathy, unspecified: Secondary | ICD-10-CM | POA: Diagnosis not present

## 2016-11-11 DIAGNOSIS — Z299 Encounter for prophylactic measures, unspecified: Secondary | ICD-10-CM | POA: Diagnosis not present

## 2016-11-11 DIAGNOSIS — I10 Essential (primary) hypertension: Secondary | ICD-10-CM | POA: Diagnosis not present

## 2016-11-11 DIAGNOSIS — F200 Paranoid schizophrenia: Secondary | ICD-10-CM | POA: Diagnosis not present

## 2016-11-11 DIAGNOSIS — I059 Rheumatic mitral valve disease, unspecified: Secondary | ICD-10-CM | POA: Diagnosis not present

## 2016-11-11 DIAGNOSIS — Z6830 Body mass index (BMI) 30.0-30.9, adult: Secondary | ICD-10-CM | POA: Diagnosis not present

## 2016-12-01 DIAGNOSIS — M545 Low back pain: Secondary | ICD-10-CM | POA: Diagnosis not present

## 2016-12-01 DIAGNOSIS — M5416 Radiculopathy, lumbar region: Secondary | ICD-10-CM | POA: Diagnosis not present

## 2016-12-01 DIAGNOSIS — M48061 Spinal stenosis, lumbar region without neurogenic claudication: Secondary | ICD-10-CM | POA: Diagnosis not present

## 2016-12-09 DIAGNOSIS — M4150 Other secondary scoliosis, site unspecified: Secondary | ICD-10-CM | POA: Diagnosis not present

## 2016-12-09 DIAGNOSIS — M4726 Other spondylosis with radiculopathy, lumbar region: Secondary | ICD-10-CM | POA: Diagnosis not present

## 2016-12-10 ENCOUNTER — Other Ambulatory Visit (HOSPITAL_COMMUNITY): Payer: Self-pay | Admitting: Neurosurgery

## 2016-12-10 DIAGNOSIS — M4150 Other secondary scoliosis, site unspecified: Principal | ICD-10-CM

## 2016-12-16 ENCOUNTER — Ambulatory Visit (HOSPITAL_COMMUNITY): Payer: Medicare Other

## 2016-12-17 ENCOUNTER — Ambulatory Visit (HOSPITAL_COMMUNITY): Payer: Medicare Other

## 2016-12-18 DIAGNOSIS — I10 Essential (primary) hypertension: Secondary | ICD-10-CM | POA: Diagnosis not present

## 2016-12-18 DIAGNOSIS — E7800 Pure hypercholesterolemia, unspecified: Secondary | ICD-10-CM | POA: Diagnosis not present

## 2016-12-24 ENCOUNTER — Ambulatory Visit (HOSPITAL_COMMUNITY)
Admission: RE | Admit: 2016-12-24 | Discharge: 2016-12-24 | Disposition: A | Discharge status: Home / Self Care | Payer: Medicare Other | Source: Ambulatory Visit | Attending: Neurosurgery | Admitting: Neurosurgery

## 2016-12-24 DIAGNOSIS — M4316 Spondylolisthesis, lumbar region: Secondary | ICD-10-CM | POA: Insufficient documentation

## 2016-12-24 DIAGNOSIS — M4150 Other secondary scoliosis, site unspecified: Secondary | ICD-10-CM | POA: Diagnosis not present

## 2016-12-24 DIAGNOSIS — M48061 Spinal stenosis, lumbar region without neurogenic claudication: Secondary | ICD-10-CM | POA: Insufficient documentation

## 2016-12-24 DIAGNOSIS — M1288 Other specific arthropathies, not elsewhere classified, other specified site: Secondary | ICD-10-CM | POA: Diagnosis not present

## 2016-12-24 DIAGNOSIS — M5136 Other intervertebral disc degeneration, lumbar region: Secondary | ICD-10-CM | POA: Insufficient documentation

## 2016-12-24 DIAGNOSIS — M5126 Other intervertebral disc displacement, lumbar region: Secondary | ICD-10-CM | POA: Insufficient documentation

## 2016-12-24 DIAGNOSIS — M47816 Spondylosis without myelopathy or radiculopathy, lumbar region: Secondary | ICD-10-CM | POA: Insufficient documentation

## 2016-12-24 DIAGNOSIS — M545 Low back pain: Secondary | ICD-10-CM | POA: Diagnosis not present

## 2016-12-30 DIAGNOSIS — M4726 Other spondylosis with radiculopathy, lumbar region: Secondary | ICD-10-CM | POA: Diagnosis not present

## 2016-12-30 DIAGNOSIS — M4150 Other secondary scoliosis, site unspecified: Secondary | ICD-10-CM | POA: Diagnosis not present

## 2016-12-30 DIAGNOSIS — Z6834 Body mass index (BMI) 34.0-34.9, adult: Secondary | ICD-10-CM | POA: Diagnosis not present

## 2017-01-02 DIAGNOSIS — I10 Essential (primary) hypertension: Secondary | ICD-10-CM | POA: Diagnosis not present

## 2017-01-02 DIAGNOSIS — E7800 Pure hypercholesterolemia, unspecified: Secondary | ICD-10-CM | POA: Diagnosis not present

## 2017-01-06 DIAGNOSIS — F200 Paranoid schizophrenia: Secondary | ICD-10-CM | POA: Diagnosis not present

## 2017-01-06 DIAGNOSIS — Z0000 Encounter for general adult medical examination without abnormal findings: Secondary | ICD-10-CM | POA: Diagnosis not present

## 2017-01-06 DIAGNOSIS — E7800 Pure hypercholesterolemia, unspecified: Secondary | ICD-10-CM | POA: Diagnosis not present

## 2017-01-06 DIAGNOSIS — Z6831 Body mass index (BMI) 31.0-31.9, adult: Secondary | ICD-10-CM | POA: Diagnosis not present

## 2017-01-06 DIAGNOSIS — I10 Essential (primary) hypertension: Secondary | ICD-10-CM | POA: Diagnosis not present

## 2017-01-06 DIAGNOSIS — R5383 Other fatigue: Secondary | ICD-10-CM | POA: Diagnosis not present

## 2017-01-06 DIAGNOSIS — Z125 Encounter for screening for malignant neoplasm of prostate: Secondary | ICD-10-CM | POA: Diagnosis not present

## 2017-01-06 DIAGNOSIS — Z1211 Encounter for screening for malignant neoplasm of colon: Secondary | ICD-10-CM | POA: Diagnosis not present

## 2017-01-06 DIAGNOSIS — Z7189 Other specified counseling: Secondary | ICD-10-CM | POA: Diagnosis not present

## 2017-01-06 DIAGNOSIS — Z299 Encounter for prophylactic measures, unspecified: Secondary | ICD-10-CM | POA: Diagnosis not present

## 2017-01-06 DIAGNOSIS — Z1331 Encounter for screening for depression: Secondary | ICD-10-CM | POA: Diagnosis not present

## 2017-01-06 DIAGNOSIS — Z1339 Encounter for screening examination for other mental health and behavioral disorders: Secondary | ICD-10-CM | POA: Diagnosis not present

## 2017-01-06 DIAGNOSIS — Z79899 Other long term (current) drug therapy: Secondary | ICD-10-CM | POA: Diagnosis not present

## 2017-01-12 ENCOUNTER — Ambulatory Visit (INDEPENDENT_AMBULATORY_CARE_PROVIDER_SITE_OTHER): Payer: Medicare Other | Admitting: Cardiovascular Disease

## 2017-01-12 ENCOUNTER — Encounter: Payer: Self-pay | Admitting: Cardiovascular Disease

## 2017-01-12 VITALS — BP 140/76 | HR 83 | Ht 68.0 in | Wt 223.0 lb

## 2017-01-12 DIAGNOSIS — I255 Ischemic cardiomyopathy: Secondary | ICD-10-CM | POA: Diagnosis not present

## 2017-01-12 DIAGNOSIS — E785 Hyperlipidemia, unspecified: Secondary | ICD-10-CM

## 2017-01-12 DIAGNOSIS — I10 Essential (primary) hypertension: Secondary | ICD-10-CM

## 2017-01-12 DIAGNOSIS — I2510 Atherosclerotic heart disease of native coronary artery without angina pectoris: Secondary | ICD-10-CM

## 2017-01-12 DIAGNOSIS — I5022 Chronic systolic (congestive) heart failure: Secondary | ICD-10-CM | POA: Diagnosis not present

## 2017-01-12 NOTE — Progress Notes (Signed)
Cardiology Office Note   Date:  01/12/2017   ID:  Glenn Hawkins, DOB 02-13-1956, MRN [id number]  PCP:  Glenda Chroman, MD  Cardiologist:   Kathlyn Sacramento, MD   Chief Complaint  Patient presents with  . other    6 month f/u pt mentioned he injuryed his back and when he steps down he has pain radiates down hip to his right leg. Meds reviewed verbally with pt.      History of Present Illness: Glenn Hawkins is a 61 y.o. male who presents for a followup visit. He has known history of coronary artery disease status post myocardial infarction with late presentation. He is being treated medically and did not have cardiac catheterization done due to chronic kidney disease (creatinine around 2.5) and late presentation. He had a nuclear stress test in January of 2013 which showed mostly a large transmural infarct in the left circumflex distribution without significant ischemia. EF was 40-45% with moderate mitral regurgitation initially. Most recent echocardiogram in March 2014 showed an ejection fraction of 40% with mild mitral regurgitation. He had extensive right leg DVT in August 2016 and has been on anticoagulation since then. He also has mild bilateral calf claudication due to peripheral arterial disease and was seen by Dr. Oneida Alar for this.  He continues to struggle with low back pain radiating to his right leg after a fall and injury to his back.  He is thinking about having surgery done in January.  Otherwise he denies any chest pain or worsening dyspnea.  No leg edema.  Renal function  has been stable with a creatinine around 2.  Past Medical History:  Diagnosis Date  . CKD (chronic kidney disease) stage 3, GFR 30-59 ml/min (H[facility])    baseline Cr around 2.5  . Claudication ([place])    a. 08/2015 ABI/duplex: R: 0.86, L 0.96. Duplex w/ bilateral heterogeneous plaque in mid fem arteries, no significant stenoses.  . Coronary atherosclerosis of native coronary artery    a. 02/2011 Late  presentation MI-->Myoview w/ large area of transmural infarct in LCX distribution, no ischemia.  . Depressive disorder, not elsewhere classified   . Esophageal reflux   . Gout, unspecified   . Hemorrhoids    a. 01/2016 s/p hemorrhoidectomy.  [name] History of DVT (deep vein thrombosis)    a. Chronic Eliquis.  . Hypertension   . Hypotension, unspecified   . Ischemic cardiomyopathy    a. 04/2012 Echo: EF 40%, inflat AK, Gr1 DD, mild AI/MR, triv TR.  . 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 04/2012 Echo: EF 40%, mild MR.  . Myocardial infarction (lateral wall) ([place]) 2013   a. 02/2011 - late presentation. Managed conservatively 2/2 CKD;  b. 02/2011 Myoview:  Large transmural infarct in the LCX distribution, no ischemia.  . Other and unspecified hyperlipidemia   . Personal history of tobacco use, presenting hazards to health   . Torn ligament   . Unspecified schizophrenia, unspecified condition     Past Surgical History:  Procedure Laterality Date  . COLONOSCOPY    . COLONOSCOPY N/A 11/10/2014   Performed by Manus Gunning, MD at [place]  . COLONOSCOPY WITH PROPOFOL N/A 05/08/2015   Performed by [name], MD at [place]  . ESOPHAGOGASTRODUODENOSCOPY (EGD) WITH PROPOFOL N/A 05/08/2015   Performed by [name], MD at [place]  . EXTENSIVE HEMORRHOIDECTOMY N/A 02/20/2016   Performed by [name], MD at [facility]  .  EYE SURGERY Right    removal of foreign body  . None    . POLYPECTOMY  05/08/2015   Performed by [name], MD at [facility]     Current Outpatient Medications  Medication Sig Dispense Refill  . amLODipine (NORVASC) 10 MG tablet Take 10 mg by mouth daily.    [name] apixaban (ELIQUIS) 5 MG TABS tablet Take 1 tablet (5 mg total) by mouth 2 (two) times daily. 180 tablet 3  . ARIPiprazole (ABILIFY) 20 MG tablet Take 20 mg by mouth 2 (two) times daily.    [name] buPROPion (WELLBUTRIN) 100 MG tablet Take 200 mg by mouth 2 (two) times daily. Takes 2  tablets at bedtime    . COLCRYS 0.6 MG tablet Take 0.6 mg by mouth every evening.     . docusate sodium (COLACE) 100 MG capsule Take 1 capsule (100 mg total) by mouth 2 (two) times daily. (Patient taking differently: Take 200 mg by mouth daily as needed for mild constipation. ) 10 capsule 0  . esomeprazole (NEXIUM) 20 MG capsule Take 20 mg by mouth daily as needed (gurd).     . hydrALAZINE (APRESOLINE) 100 MG tablet Take 100 mg by mouth 3 (three) times daily.     . isosorbide mononitrate (IMDUR) 60 MG 24 hr tablet TAKE 1 TABLET EVERY MORNING 90 tablet 3  . LORazepam (ATIVAN) 1 MG tablet Take 2 mg by mouth at bedtime.     . metoprolol (LOPRESSOR) 100 MG tablet Take 100 mg by mouth 2 (two) times daily.     . nitroGLYCERIN (NITROSTAT) 0.4 MG SL tablet Place 1 tablet (0.4 mg total) under the tongue every 5 (five) minutes as needed for chest pain. 25 tablet 3  . oxyCODONE-acetaminophen (PERCOCET) 7.5-325 MG tablet Take 1 tablet by mouth every 4 (four) hours as needed. (Patient taking differently: Take 1 tablet by mouth every 4 (four) hours as needed for moderate pain. ) 50 tablet 0  . QUEtiapine (SEROQUEL) 400 MG tablet Take 400 mg by mouth at bedtime.    . sertraline (ZOLOFT) 100 MG tablet Take 200 mg by mouth daily. Takes 2 tablets daily    . sildenafil (VIAGRA) 50 MG tablet Take 12.5 mg by mouth daily as needed for erectile dysfunction. Takes 1/4 of a tablet when needed    . simvastatin (ZOCOR) 40 MG tablet TAKE 1 TABLET AT BEDTIME 90 tablet 3  . spironolactone (ALDACTONE) 25 MG tablet Take 50 mg by mouth at bedtime. Takes 2 tablets at bedtime     No current facility-administered medications for this visit.     Allergies:   Nifedipine; Rabeprazole; Benazepril; Haloperidol and related; and Omeprazole    Social History:  The patient  reports that he quit smoking about 20 years ago. His smoking use included cigarettes. He has a 23.00 pack-year smoking history. he has never used smokeless tobacco. He  reports that he does not drink alcohol or use drugs.   Family History:  The patient's family history is not on file.    ROS:  Please see the history of present illness.   Otherwise, review of systems are positive for none.   All other systems are reviewed and negative.    PHYSICAL EXAM: VS:  BP 140/76 (BP Location: Left Arm, Patient Position: Sitting, Cuff Size: Normal)   Pulse 83   Ht 5\' 8"  (1.727 m)   Wt 223 lb (101.2 kg)   BMI 33.91 kg/m  , BMI Body mass index  is 33.91 kg/m. GEN: Well nourished, well developed, in no acute distress  HEENT: normal  Neck: no JVD, carotid bruits, or masses Cardiac: RRR; no murmurs, rubs, or gallops,no edema  Respiratory:  clear to auscultation bilaterally, normal work of breathing GI: soft, nontender, nondistended, + BS MS: no deformity or atrophy  Skin: warm and dry, no rash Neuro:  Strength and sensation are intact Psych: euthymic mood, full affect   EKG:  EKG is ordered today. The ekg ordered today demonstrates normal sinus rhythm with nonspecific T wave changes  Recent Labs: 02/14/2016: BUN 28; Creatinine, Ser 2.21; Hemoglobin 9.7; Platelets 151; Potassium 4.0; Sodium 141    Lipid Panel    Component Value Date/Time   CHOL 191 11/09/2014 0525   TRIG 117 11/09/2014 0525   HDL 45 11/09/2014 0525   CHOLHDL 4.2 11/09/2014 0525   VLDL 23 11/09/2014 0525   LDLCALC 123 (H) 11/09/2014 0525      Wt Readings from Last 3 Encounters:  01/12/17 223 lb (101.2 kg)  05/08/16 214 lb 9.6 oz (97.3 kg)  03/29/16 220 lb (99.8 kg)       ASSESSMENT AND PLAN:  1.  Coronary artery disease involving native coronary arteries without angina: He continues to be clinically stable with no new symptoms.  Continue medical therapy. Back surgery as needed, he is considered at moderate risk.  No indication for repeat stress testing given that his symptoms have been stable.  2. Chronic systolic heart failure: Most recent echocardiogram in 2014 showed an  ejection fraction of 40% with inferolateral akinesis. Continue treatment with metoprolol and a combination of hydralazine/Imdur. He is not on ACE inhibitor or ARB due to chronic kidney disease.  Renal function has been stable with no evidence of hyperkalemia. I will consider repeat echocardiogram next year.  3. Essential hypertension: Blood pressure is well controlled on current medications.  4. History of right leg DVT: Currently on anticoagulation with Eliquis.  5. Hyperlipidemia: Continue treatment with simvastatin with a target LDL of less than 70.  If LDL is not at target, consider switching simvastatin to atorvastatin or rosuvastatin.   Disposition:   FU with me in 6 months  Signed,  Kathlyn Sacramento, MD  01/12/2017 1:57 PM    [place]

## 2017-01-12 NOTE — Patient Instructions (Signed)
Medication Instructions: Continue same medications.   Labwork: None.   Procedures/Testing: None.   Follow-Up: 6 months with Dr. Mahlet Jergens.   Any Additional Special Instructions Will Be Listed Below (If Applicable).     If you need a refill on your cardiac medications before your next appointment, please call your pharmacy.   

## 2017-01-26 DIAGNOSIS — I5022 Chronic systolic (congestive) heart failure: Secondary | ICD-10-CM | POA: Diagnosis not present

## 2017-01-26 DIAGNOSIS — Z6830 Body mass index (BMI) 30.0-30.9, adult: Secondary | ICD-10-CM | POA: Diagnosis not present

## 2017-01-26 DIAGNOSIS — N184 Chronic kidney disease, stage 4 (severe): Secondary | ICD-10-CM | POA: Diagnosis not present

## 2017-01-26 DIAGNOSIS — Z299 Encounter for prophylactic measures, unspecified: Secondary | ICD-10-CM | POA: Diagnosis not present

## 2017-01-26 DIAGNOSIS — I429 Cardiomyopathy, unspecified: Secondary | ICD-10-CM | POA: Diagnosis not present

## 2017-01-26 DIAGNOSIS — I82409 Acute embolism and thrombosis of unspecified deep veins of unspecified lower extremity: Secondary | ICD-10-CM | POA: Diagnosis not present

## 2017-02-04 ENCOUNTER — Other Ambulatory Visit: Payer: Self-pay | Admitting: Cardiovascular Disease

## 2017-02-04 DIAGNOSIS — Z6831 Body mass index (BMI) 31.0-31.9, adult: Secondary | ICD-10-CM | POA: Diagnosis not present

## 2017-02-04 DIAGNOSIS — F200 Paranoid schizophrenia: Secondary | ICD-10-CM | POA: Diagnosis not present

## 2017-02-04 DIAGNOSIS — I739 Peripheral vascular disease, unspecified: Secondary | ICD-10-CM | POA: Diagnosis not present

## 2017-02-04 DIAGNOSIS — N184 Chronic kidney disease, stage 4 (severe): Secondary | ICD-10-CM | POA: Diagnosis not present

## 2017-02-04 DIAGNOSIS — I5022 Chronic systolic (congestive) heart failure: Secondary | ICD-10-CM | POA: Diagnosis not present

## 2017-02-04 DIAGNOSIS — Z299 Encounter for prophylactic measures, unspecified: Secondary | ICD-10-CM | POA: Diagnosis not present

## 2017-02-04 DIAGNOSIS — M109 Gout, unspecified: Secondary | ICD-10-CM | POA: Diagnosis not present

## 2017-02-04 DIAGNOSIS — Z713 Dietary counseling and surveillance: Secondary | ICD-10-CM | POA: Diagnosis not present

## 2017-02-04 DIAGNOSIS — I429 Cardiomyopathy, unspecified: Secondary | ICD-10-CM | POA: Diagnosis not present

## 2017-02-11 DIAGNOSIS — Z6831 Body mass index (BMI) 31.0-31.9, adult: Secondary | ICD-10-CM | POA: Diagnosis not present

## 2017-02-11 DIAGNOSIS — M25572 Pain in left ankle and joints of left foot: Secondary | ICD-10-CM | POA: Diagnosis not present

## 2017-02-11 DIAGNOSIS — Z299 Encounter for prophylactic measures, unspecified: Secondary | ICD-10-CM | POA: Diagnosis not present

## 2017-02-11 DIAGNOSIS — Z713 Dietary counseling and surveillance: Secondary | ICD-10-CM | POA: Diagnosis not present

## 2017-02-25 DIAGNOSIS — E7800 Pure hypercholesterolemia, unspecified: Secondary | ICD-10-CM | POA: Diagnosis not present

## 2017-02-25 DIAGNOSIS — I10 Essential (primary) hypertension: Secondary | ICD-10-CM | POA: Diagnosis not present

## 2017-03-26 DIAGNOSIS — Z299 Encounter for prophylactic measures, unspecified: Secondary | ICD-10-CM | POA: Diagnosis not present

## 2017-03-26 DIAGNOSIS — I10 Essential (primary) hypertension: Secondary | ICD-10-CM | POA: Diagnosis not present

## 2017-03-26 DIAGNOSIS — F200 Paranoid schizophrenia: Secondary | ICD-10-CM | POA: Diagnosis not present

## 2017-03-26 DIAGNOSIS — M109 Gout, unspecified: Secondary | ICD-10-CM | POA: Diagnosis not present

## 2017-03-26 DIAGNOSIS — Z6830 Body mass index (BMI) 30.0-30.9, adult: Secondary | ICD-10-CM | POA: Diagnosis not present

## 2017-03-26 DIAGNOSIS — I5022 Chronic systolic (congestive) heart failure: Secondary | ICD-10-CM | POA: Diagnosis not present

## 2017-03-26 DIAGNOSIS — N184 Chronic kidney disease, stage 4 (severe): Secondary | ICD-10-CM | POA: Diagnosis not present

## 2017-03-26 DIAGNOSIS — Z87891 Personal history of nicotine dependence: Secondary | ICD-10-CM | POA: Diagnosis not present

## 2017-03-26 DIAGNOSIS — I429 Cardiomyopathy, unspecified: Secondary | ICD-10-CM | POA: Diagnosis not present

## 2017-03-30 DIAGNOSIS — I10 Essential (primary) hypertension: Secondary | ICD-10-CM | POA: Diagnosis not present

## 2017-03-30 DIAGNOSIS — E7800 Pure hypercholesterolemia, unspecified: Secondary | ICD-10-CM | POA: Diagnosis not present

## 2017-04-08 DIAGNOSIS — I5022 Chronic systolic (congestive) heart failure: Secondary | ICD-10-CM | POA: Diagnosis not present

## 2017-04-08 DIAGNOSIS — I10 Essential (primary) hypertension: Secondary | ICD-10-CM | POA: Diagnosis not present

## 2017-04-08 DIAGNOSIS — Z6830 Body mass index (BMI) 30.0-30.9, adult: Secondary | ICD-10-CM | POA: Diagnosis not present

## 2017-04-08 DIAGNOSIS — N184 Chronic kidney disease, stage 4 (severe): Secondary | ICD-10-CM | POA: Diagnosis not present

## 2017-04-08 DIAGNOSIS — Z299 Encounter for prophylactic measures, unspecified: Secondary | ICD-10-CM | POA: Diagnosis not present

## 2017-04-08 DIAGNOSIS — M109 Gout, unspecified: Secondary | ICD-10-CM | POA: Diagnosis not present

## 2017-04-08 DIAGNOSIS — I739 Peripheral vascular disease, unspecified: Secondary | ICD-10-CM | POA: Diagnosis not present

## 2017-04-08 DIAGNOSIS — I429 Cardiomyopathy, unspecified: Secondary | ICD-10-CM | POA: Diagnosis not present

## 2017-04-08 DIAGNOSIS — F200 Paranoid schizophrenia: Secondary | ICD-10-CM | POA: Diagnosis not present

## 2017-05-04 DIAGNOSIS — I10 Essential (primary) hypertension: Secondary | ICD-10-CM | POA: Diagnosis not present

## 2017-05-04 DIAGNOSIS — E7800 Pure hypercholesterolemia, unspecified: Secondary | ICD-10-CM | POA: Diagnosis not present

## 2017-05-20 DIAGNOSIS — Z6831 Body mass index (BMI) 31.0-31.9, adult: Secondary | ICD-10-CM | POA: Diagnosis not present

## 2017-05-20 DIAGNOSIS — N184 Chronic kidney disease, stage 4 (severe): Secondary | ICD-10-CM | POA: Diagnosis not present

## 2017-05-20 DIAGNOSIS — Z299 Encounter for prophylactic measures, unspecified: Secondary | ICD-10-CM | POA: Diagnosis not present

## 2017-05-20 DIAGNOSIS — I5022 Chronic systolic (congestive) heart failure: Secondary | ICD-10-CM | POA: Diagnosis not present

## 2017-05-20 DIAGNOSIS — I429 Cardiomyopathy, unspecified: Secondary | ICD-10-CM | POA: Diagnosis not present

## 2017-05-20 DIAGNOSIS — M109 Gout, unspecified: Secondary | ICD-10-CM | POA: Diagnosis not present

## 2017-06-01 DIAGNOSIS — T560X1A Toxic effect of lead and its compounds, accidental (unintentional), initial encounter: Secondary | ICD-10-CM | POA: Diagnosis not present

## 2017-06-01 DIAGNOSIS — Z87898 Personal history of other specified conditions: Secondary | ICD-10-CM | POA: Diagnosis not present

## 2017-06-01 DIAGNOSIS — M7022 Olecranon bursitis, left elbow: Secondary | ICD-10-CM | POA: Diagnosis not present

## 2017-06-01 DIAGNOSIS — M7989 Other specified soft tissue disorders: Secondary | ICD-10-CM | POA: Diagnosis not present

## 2017-06-01 DIAGNOSIS — L539 Erythematous condition, unspecified: Secondary | ICD-10-CM | POA: Diagnosis not present

## 2017-06-08 DIAGNOSIS — M7022 Olecranon bursitis, left elbow: Secondary | ICD-10-CM | POA: Diagnosis not present

## 2017-06-08 DIAGNOSIS — T560X1A Toxic effect of lead and its compounds, accidental (unintentional), initial encounter: Secondary | ICD-10-CM | POA: Diagnosis not present

## 2017-06-10 DIAGNOSIS — I10 Essential (primary) hypertension: Secondary | ICD-10-CM | POA: Diagnosis not present

## 2017-06-10 DIAGNOSIS — E7800 Pure hypercholesterolemia, unspecified: Secondary | ICD-10-CM | POA: Diagnosis not present

## 2017-07-08 DIAGNOSIS — I10 Essential (primary) hypertension: Secondary | ICD-10-CM | POA: Diagnosis not present

## 2017-07-08 DIAGNOSIS — E7800 Pure hypercholesterolemia, unspecified: Secondary | ICD-10-CM | POA: Diagnosis not present

## 2017-08-19 ENCOUNTER — Other Ambulatory Visit: Payer: Self-pay

## 2017-08-19 MED ORDER — SIMVASTATIN 40 MG PO TABS: 40.0000 mg | ORAL_TABLET | Freq: Every day | ORAL | 0 refills | Status: DC

## 2017-08-20 ENCOUNTER — Other Ambulatory Visit: Payer: Self-pay | Admitting: *Deleted

## 2017-08-20 DIAGNOSIS — E7800 Pure hypercholesterolemia, unspecified: Secondary | ICD-10-CM | POA: Diagnosis not present

## 2017-08-20 DIAGNOSIS — I10 Essential (primary) hypertension: Secondary | ICD-10-CM | POA: Diagnosis not present

## 2017-08-20 MED ORDER — SIMVASTATIN 40 MG PO TABS: 40.0000 mg | ORAL_TABLET | Freq: Every day | ORAL | 0 refills | Status: DC

## 2017-09-07 DIAGNOSIS — T560X1A Toxic effect of lead and its compounds, accidental (unintentional), initial encounter: Secondary | ICD-10-CM | POA: Diagnosis not present

## 2017-09-07 DIAGNOSIS — M7022 Olecranon bursitis, left elbow: Secondary | ICD-10-CM | POA: Diagnosis not present

## 2017-10-16 DIAGNOSIS — H2513 Age-related nuclear cataract, bilateral: Secondary | ICD-10-CM | POA: Diagnosis not present

## 2017-10-16 DIAGNOSIS — H33022 Retinal detachment with multiple breaks, left eye: Secondary | ICD-10-CM | POA: Diagnosis not present

## 2017-10-16 DIAGNOSIS — H442C1 Degenerative myopia with retinal detachment, right eye: Secondary | ICD-10-CM | POA: Diagnosis not present

## 2017-10-21 DIAGNOSIS — H33022 Retinal detachment with multiple breaks, left eye: Secondary | ICD-10-CM | POA: Diagnosis not present

## 2017-10-22 DIAGNOSIS — H2513 Age-related nuclear cataract, bilateral: Secondary | ICD-10-CM | POA: Diagnosis not present

## 2017-10-22 DIAGNOSIS — H33022 Retinal detachment with multiple breaks, left eye: Secondary | ICD-10-CM | POA: Diagnosis not present

## 2017-10-22 DIAGNOSIS — H442C1 Degenerative myopia with retinal detachment, right eye: Secondary | ICD-10-CM | POA: Diagnosis not present

## 2017-11-05 ENCOUNTER — Encounter: Payer: Self-pay | Admitting: Cardiovascular Disease

## 2017-11-05 ENCOUNTER — Ambulatory Visit (INDEPENDENT_AMBULATORY_CARE_PROVIDER_SITE_OTHER): Payer: Medicare Other | Admitting: Cardiovascular Disease

## 2017-11-05 VITALS — BP 136/82 | HR 76 | Ht 68.0 in | Wt 213.2 lb

## 2017-11-05 DIAGNOSIS — I2510 Atherosclerotic heart disease of native coronary artery without angina pectoris: Secondary | ICD-10-CM | POA: Diagnosis not present

## 2017-11-05 DIAGNOSIS — I10 Essential (primary) hypertension: Secondary | ICD-10-CM

## 2017-11-05 DIAGNOSIS — E785 Hyperlipidemia, unspecified: Secondary | ICD-10-CM

## 2017-11-05 DIAGNOSIS — I5022 Chronic systolic (congestive) heart failure: Secondary | ICD-10-CM | POA: Diagnosis not present

## 2017-11-05 NOTE — Patient Instructions (Signed)
Medication Instructions:  Your physician recommends that you continue on your current medications as directed. Please refer to the Current Medication list given to you today.   Labwork: none  Testing/Procedures: Your physician has requested that you have an echocardiogram. Echocardiography is a painless test that uses sound waves to create images of your heart. It provides your doctor with information about the size and shape of your heart and how well your heart's chambers and valves are working. This procedure takes approximately one hour. There are no restrictions for this procedure. You may get an IV, if needed, to receive an ultrasound enhancing agent through to better visualize your heart.     Follow-Up: Your physician recommends that you schedule a follow-up appointment in: 6 MONTHS WITH DR ARIDA.   If you need a refill on your cardiac medications before your next appointment, please call your pharmacy.   Echocardiogram An echocardiogram, or echocardiography, uses sound waves (ultrasound) to produce an image of your heart. The echocardiogram is simple, painless, obtained within a short period of time, and offers valuable information to your health care provider. The images from an echocardiogram can provide information such as:  Evidence of coronary artery disease (CAD).  Heart size.  Heart muscle function.  Heart valve function.  Aneurysm detection.  Evidence of a past heart attack.  Fluid buildup around the heart.  Heart muscle thickening.  Assess heart valve function.  Tell a health care provider about:  Any allergies you have.  All medicines you are taking, including vitamins, herbs, eye drops, creams, and over-the-counter medicines.  Any problems you or family members have had with anesthetic medicines.  Any blood disorders you have.  Any surgeries you have had.  Any medical conditions you have.  Whether you are pregnant or may be pregnant. What  happens before the procedure? No special preparation is needed. Eat and drink normally. What happens during the procedure?  In order to produce an image of your heart, gel will be applied to your chest and a wand-like tool (transducer) will be moved over your chest. The gel will help transmit the sound waves from the transducer. The sound waves will harmlessly bounce off your heart to allow the heart images to be captured in real-time motion. These images will then be recorded.  You may need an IV to receive a medicine that improves the quality of the pictures. What happens after the procedure? You may return to your normal schedule including diet, activities, and medicines, unless your health care provider tells you otherwise. This information is not intended to replace advice given to you by your health care provider. Make sure you discuss any questions you have with your health care provider. Document Released: 02/08/2000 Document Revised: 09/29/2015 Document Reviewed: 10/18/2012 Elsevier Interactive Patient Education  2017 [employer]  

## 2017-11-05 NOTE — Progress Notes (Signed)
Cardiology Office Note   Date:  11/05/2017   ID:  Glenn Hawkins, DOB 1956/01/28, MRN [id number]  PCP:  Glenda Chroman, MD  Cardiologist:   Kathlyn Sacramento, MD   Chief Complaint  Patient presents with  . other    6 month follow up. Meds reviewed by the pt. verbally. "doing well."       History of Present Illness: Glenn Hawkins is a 62 y.o. male who presents for a followup visit. He has known history of coronary artery disease status post myocardial infarction with late presentation. He is being treated medically and did not have cardiac catheterization done due to chronic kidney disease (creatinine around 2.5) and late presentation. He had a nuclear stress test in January of 2013 which showed mostly a large transmural infarct in the left circumflex distribution without significant ischemia. EF was 40-45% with moderate mitral regurgitation initially. Most recent echocardiogram in March 2014 showed an ejection fraction of 40% with mild mitral regurgitation. He had extensive right leg DVT in August 2016 and has been on anticoagulation since then. He also has mild bilateral calf claudication due to peripheral arterial disease and was seen by Dr. Oneida Alar for this.  Had significant back pain last year after a fall but symptoms improved without surgical intervention.  He had recent eye surgery. He reports stable cardiac symptoms with no chest pain, stable exertional dyspnea no significant leg edema.  He takes his medications regularly.  Most recent creatinine was stable at 2.3.  Past Medical History:  Diagnosis Date  . CKD (chronic kidney disease) stage 3, GFR 30-59 ml/min (HCC)    baseline Cr around 2.5  . Claudication ([place])    a. 08/2015 ABI/duplex: R: 0.86, L 0.96. Duplex w/ bilateral heterogeneous plaque in mid fem arteries, no significant stenoses.  . Coronary atherosclerosis of native coronary artery    a. 02/2011 Late presentation MI-->Myoview w/ large area of transmural infarct in LCX  distribution, no ischemia.  . Depressive disorder, not elsewhere classified   . Esophageal reflux   . Gout, unspecified   . Hemorrhoids    a. 01/2016 s/p hemorrhoidectomy.  [name] History of DVT (deep vein thrombosis)    a. Chronic Eliquis.  . Hypertension   . Hypotension, unspecified   . Ischemic cardiomyopathy    a. 04/2012 Echo: EF 40%, inflat AK, Gr1 DD, mild AI/MR, triv TR.  . 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 04/2012 Echo: EF 40%, mild MR.  . Myocardial infarction (lateral wall) ([place]) 2013   a. 02/2011 - late presentation. Managed conservatively 2/2 CKD;  b. 02/2011 Myoview:  Large transmural infarct in the LCX distribution, no ischemia.  . Other and unspecified hyperlipidemia   . Personal history of tobacco use, presenting hazards to health   . Torn ligament   . Unspecified schizophrenia, unspecified condition     Past Surgical History:  Procedure Laterality Date  . COLONOSCOPY    . COLONOSCOPY N/A 11/10/2014   Procedure: COLONOSCOPY;  Surgeon: Manus Gunning, MD;  Location: [facility] ENDOSCOPY;  Service: Gastroenterology;  Laterality: N/A;  . COLONOSCOPY WITH PROPOFOL N/A 05/08/2015   Procedure: COLONOSCOPY WITH PROPOFOL;  Surgeon: [name], MD;  Location: [facility] ENDO SUITE;  Service: Endoscopy;  Laterality: N/A;  1215 - office states unable to move patient  . ESOPHAGOGASTRODUODENOSCOPY (EGD) WITH PROPOFOL N/A 05/08/2015   Procedure: ESOPHAGOGASTRODUODENOSCOPY (EGD) WITH PROPOFOL;  Surgeon: [name], MD;  Location: [facility] ENDO SUITE;  Service: Endoscopy;  Laterality:  N/A;  . EYE SURGERY Right    removal of foreign body  . HEMORRHOID SURGERY N/A 02/20/2016   Procedure: EXTENSIVE HEMORRHOIDECTOMY;  Surgeon: [name], MD;  Location: [facility] ORS;  Service: General;  Laterality: N/A;  . None    . POLYPECTOMY  05/08/2015   Procedure: POLYPECTOMY;  Surgeon: [name], MD;  Location: [facility] ENDO SUITE;  Service: Endoscopy;;  transverse colon polyp     Current Outpatient Medications   Medication Sig Dispense Refill  . amLODipine (NORVASC) 10 MG tablet Take 10 mg by mouth daily.    [name] apixaban (ELIQUIS) 5 MG TABS tablet Take 1 tablet (5 mg total) by mouth 2 (two) times daily. 180 tablet 3  . ARIPiprazole (ABILIFY) 20 MG tablet Take 20 mg by mouth 2 (two) times daily.    [name] buPROPion (WELLBUTRIN) 100 MG tablet Take 200 mg by mouth 2 (two) times daily. Takes 2 tablets at bedtime    . COLCRYS 0.6 MG tablet Take 0.6 mg by mouth every evening.     [name] esomeprazole (NEXIUM) 20 MG capsule Take 20 mg by mouth daily as needed (gurd).     . hydrALAZINE (APRESOLINE) 100 MG tablet Take 100 mg by mouth 3 (three) times daily.     . isosorbide mononitrate (IMDUR) 60 MG 24 hr tablet TAKE 1 TABLET EVERY MORNING 90 tablet 3  . LORazepam (ATIVAN) 1 MG tablet Take 2 mg by mouth at bedtime.     . metoprolol (LOPRESSOR) 100 MG tablet Take 100 mg by mouth 2 (two) times daily.     . nitroGLYCERIN (NITROSTAT) 0.4 MG SL tablet Place 1 tablet (0.4 mg total) under the tongue every 5 (five) minutes as needed for chest pain. 25 tablet 3  . QUEtiapine (SEROQUEL) 300 MG tablet Take 300 mg by mouth at bedtime.     . sertraline (ZOLOFT) 100 MG tablet Take 200 mg by mouth daily. Takes 2 tablets daily    . simvastatin (ZOCOR) 40 MG tablet Take 1 tablet (40 mg total) by mouth at bedtime. 30 tablet 0  . spironolactone (ALDACTONE) 25 MG tablet Take 50 mg by mouth at bedtime. Takes 2 tablets at bedtime     No current facility-administered medications for this visit.     Allergies:   Nifedipine; Rabeprazole; Benazepril; Haloperidol; Haloperidol and related; and Omeprazole    Social History:  The patient  reports that he quit smoking about 21 years ago. His smoking use included cigarettes. He has a 23.00 pack-year smoking history. He has never used smokeless tobacco. He reports that he does not drink alcohol or use drugs.   Family History:  The patient's family history is not on file.    ROS:  Please see the  history of present illness.   Otherwise, review of systems are positive for none.   All other systems are reviewed and negative.    PHYSICAL EXAM: VS:  BP 136/82 (BP Location: Left Arm, Patient Position: Sitting, Cuff Size: Normal)   Pulse 76   Ht 5\' 8"  (1.727 m)   Wt 213 lb 4 oz (96.7 kg)   BMI 32.42 kg/m  , BMI Body mass index is 32.42 kg/m. GEN: Well nourished, well developed, in no acute distress  HEENT: normal  Neck: no JVD, carotid bruits, or masses Cardiac: RRR; no murmurs, rubs, or gallops,no edema  Respiratory:  clear to auscultation bilaterally, normal work of breathing GI: soft, nontender, nondistended, + BS MS: no deformity or atrophy  Skin: warm  and dry, no rash Neuro:  Strength and sensation are intact Psych: euthymic mood, full affect   EKG:  EKG is ordered today. The ekg ordered today demonstrates normal sinus rhythm with minimal LVH.  T wave inversions anterior lateral leads.  Recent Labs: No results found for requested labs within last 8760 hours.    Lipid Panel    Component Value Date/Time   CHOL 191 11/09/2014 0525   TRIG 117 11/09/2014 0525   HDL 45 11/09/2014 0525   CHOLHDL 4.2 11/09/2014 0525   VLDL 23 11/09/2014 0525   LDLCALC 123 (H) 11/09/2014 0525      Wt Readings from Last 3 Encounters:  11/05/17 213 lb 4 oz (96.7 kg)  01/12/17 223 lb (101.2 kg)  05/08/16 214 lb 9.6 oz (97.3 kg)       ASSESSMENT AND PLAN:  1.  Coronary artery disease involving native coronary arteries without angina: He continues to be clinically stable with no new symptoms.  Continue medical therapy.  2. Chronic systolic heart failure: Most recent echocardiogram in 2014 showed an ejection fraction of 40% with inferolateral akinesis. Continue treatment with metoprolol and a combination of hydralazine/Imdur. He is not on ACE inhibitor or ARB due to chronic kidney disease.  He is on spironolactone and renal function has been stable on this.  I am going to repeat  echocardiogram to evaluate his ejection fraction.  3. Essential hypertension: Blood pressure is well controlled on current medications.  4. History of right leg DVT: Currently on anticoagulation with Eliquis.  5. Hyperlipidemia: Currently on simvastatin daily.  Most recent lipid profile in November 2018 showed a total cholesterol of 169, triglyceride 145, HDL of 46 and an LDL of 94.  We should consider switching him to atorvastatin or rosuvastatin to achieve an LDL below 70.   Disposition:   FU with me in 6 months  Signed,  Kathlyn Sacramento, MD  11/05/2017 3:24 PM    [facility]

## 2017-11-06 ENCOUNTER — Other Ambulatory Visit: Payer: Self-pay | Admitting: Cardiovascular Disease

## 2017-11-06 DIAGNOSIS — I5022 Chronic systolic (congestive) heart failure: Secondary | ICD-10-CM

## 2017-11-16 ENCOUNTER — Other Ambulatory Visit: Payer: Medicare Other

## 2017-11-18 ENCOUNTER — Other Ambulatory Visit: Payer: Self-pay | Admitting: Cardiovascular Disease

## 2017-12-03 ENCOUNTER — Other Ambulatory Visit: Payer: Medicare Other

## 2017-12-11 ENCOUNTER — Other Ambulatory Visit: Payer: Medicare Other

## 2017-12-14 ENCOUNTER — Telehealth: Payer: Self-pay | Admitting: Cardiovascular Disease

## 2017-12-14 NOTE — Telephone Encounter (Signed)
Returned the call to the patient. He stated that he would rather not have the IV with definity when he has his echo completed due to kidney problems. It was explained that this was a very small amount and that it may not have to be used. He stated that he felt uncomfortable with any IV dye being used and therefore would rather not have it used during the echo.

## 2017-12-14 NOTE — Telephone Encounter (Signed)
Patient calling Has an ECHO scheduled for 10/29 Has questions in regards to test Please call to discuss

## 2017-12-15 ENCOUNTER — Other Ambulatory Visit: Payer: Medicare Other

## 2017-12-15 NOTE — Telephone Encounter (Signed)
Echo contrast which is called Definity is different from IV dye which is used for angiograms and CT scans.  Echo contrast does not affect the kidney function.  If he is still concerned, he can have the echo done without contrast which is what we do most of the time anyway.

## 2017-12-22 ENCOUNTER — Other Ambulatory Visit: Payer: Medicare Other

## 2017-12-24 ENCOUNTER — Ambulatory Visit (INDEPENDENT_AMBULATORY_CARE_PROVIDER_SITE_OTHER): Payer: Medicare Other

## 2017-12-24 ENCOUNTER — Other Ambulatory Visit: Payer: Self-pay

## 2017-12-24 DIAGNOSIS — I5022 Chronic systolic (congestive) heart failure: Secondary | ICD-10-CM

## 2018-01-01 ENCOUNTER — Telehealth: Payer: Self-pay | Admitting: *Deleted

## 2018-01-01 MED ORDER — CARVEDILOL 25 MG PO TABS: 25.0000 mg | ORAL_TABLET | Freq: Two times a day (BID) | ORAL | 1 refills | Status: DC

## 2018-01-01 NOTE — Telephone Encounter (Signed)
-----   Message from Wellington Hampshire, MD sent at 12/30/2017  9:29 AM EST ----- Inform patient that echo showed an EF of 35 to 40% which is slightly less than before.  I recommend switching him from metoprolol to carvedilol 25 mg twice daily which should work better for his low EF.  If possible, he should monitor his blood pressure and heart rate for 3 to 4 days after the switch.

## 2018-01-01 NOTE — Telephone Encounter (Signed)
Patient made aware of results and verbalized understanding.  Carvedilol 25 mg bid will be sent to [facility]. The patient will take the Metoprolol until the Carvedilol arrives. He will then check his blood pressure for the next 3-4 days and call back with any concerns.

## 2018-01-13 NOTE — Telephone Encounter (Signed)
Returned the call to the patient. He stated that he only wanted to speak with Dr. Fletcher Anon and would not disclose anymore information other than it was about his Echo results.

## 2018-01-13 NOTE — Telephone Encounter (Signed)
Patient calling to discuss recent echo testing results WITH MD Unable to obtain details patient declined to disclose Please call

## 2018-01-14 NOTE — Telephone Encounter (Signed)
I called his cell phone twice and he did not answer. I called his home phone and he was not present there.

## 2018-01-14 NOTE — Telephone Encounter (Signed)
Patient returning call  States that he was at a doctors appointment when he missed the calls Spoke with Lattie Haw, Dr. Fletcher Anon has left for the day and will possibly try again tomorrow Patient made aware

## 2018-01-18 DIAGNOSIS — I5022 Chronic systolic (congestive) heart failure: Secondary | ICD-10-CM | POA: Diagnosis not present

## 2018-01-18 DIAGNOSIS — Z6831 Body mass index (BMI) 31.0-31.9, adult: Secondary | ICD-10-CM | POA: Diagnosis not present

## 2018-01-18 DIAGNOSIS — I10 Essential (primary) hypertension: Secondary | ICD-10-CM | POA: Diagnosis not present

## 2018-01-18 DIAGNOSIS — Z299 Encounter for prophylactic measures, unspecified: Secondary | ICD-10-CM | POA: Diagnosis not present

## 2018-01-18 DIAGNOSIS — E7800 Pure hypercholesterolemia, unspecified: Secondary | ICD-10-CM | POA: Diagnosis not present

## 2018-01-18 DIAGNOSIS — Z79899 Other long term (current) drug therapy: Secondary | ICD-10-CM | POA: Diagnosis not present

## 2018-01-18 DIAGNOSIS — R5383 Other fatigue: Secondary | ICD-10-CM | POA: Diagnosis not present

## 2018-01-18 DIAGNOSIS — Z7189 Other specified counseling: Secondary | ICD-10-CM | POA: Diagnosis not present

## 2018-01-18 DIAGNOSIS — Z1339 Encounter for screening examination for other mental health and behavioral disorders: Secondary | ICD-10-CM | POA: Diagnosis not present

## 2018-01-18 DIAGNOSIS — Z1331 Encounter for screening for depression: Secondary | ICD-10-CM | POA: Diagnosis not present

## 2018-01-18 DIAGNOSIS — Z1211 Encounter for screening for malignant neoplasm of colon: Secondary | ICD-10-CM | POA: Diagnosis not present

## 2018-01-18 DIAGNOSIS — Z0000 Encounter for general adult medical examination without abnormal findings: Secondary | ICD-10-CM | POA: Diagnosis not present

## 2018-01-18 DIAGNOSIS — Z125 Encounter for screening for malignant neoplasm of prostate: Secondary | ICD-10-CM | POA: Diagnosis not present

## 2018-01-30 ENCOUNTER — Other Ambulatory Visit: Payer: Self-pay | Admitting: Cardiovascular Disease

## 2018-02-02 ENCOUNTER — Ambulatory Visit (INDEPENDENT_AMBULATORY_CARE_PROVIDER_SITE_OTHER): Payer: Medicare Other | Admitting: Cardiovascular Disease

## 2018-02-02 VITALS — BP 146/88 | HR 76 | Ht 68.0 in | Wt 210.5 lb

## 2018-02-02 DIAGNOSIS — I10 Essential (primary) hypertension: Secondary | ICD-10-CM | POA: Diagnosis not present

## 2018-02-02 DIAGNOSIS — E785 Hyperlipidemia, unspecified: Secondary | ICD-10-CM | POA: Diagnosis not present

## 2018-02-02 DIAGNOSIS — I2510 Atherosclerotic heart disease of native coronary artery without angina pectoris: Secondary | ICD-10-CM | POA: Diagnosis not present

## 2018-02-02 DIAGNOSIS — I5022 Chronic systolic (congestive) heart failure: Secondary | ICD-10-CM | POA: Diagnosis not present

## 2018-02-02 NOTE — Progress Notes (Signed)
Cardiology Office Note   Date:  02/02/2018   ID:  BARNES FLOREK, DOB 07/02/1955, MRN [id number]  PCP:  Glenda Chroman, MD  Cardiologist:   Kathlyn Sacramento, MD   Chief Complaint  Patient presents with  . other    F/u echo results no complaints today. Meds reviewed verbally with pt.      History of Present Illness: Glenn Hawkins is a 62 y.o. male who presents for a followup visit. He has known history of coronary artery disease status post myocardial infarction with late presentation. He is being treated medically and did not have cardiac catheterization done due to chronic kidney disease (creatinine around 2.5) and late presentation. He had a nuclear stress test in January of 2013 which showed mostly a large transmural infarct in the left circumflex distribution without significant ischemia. EF was 40-45% with moderate mitral regurgitation.  He had extensive right leg DVT in August 2016 and has been on anticoagulation since then. He also has mild bilateral calf claudication due to peripheral arterial disease and was seen by Dr. Oneida Alar for this.  He had a repeat echocardiogram in October which was personally reviewed by me and showed an EF of 35 to 40%.  There was mild aortic and mitral regurgitation.  I elected to switch him from metoprolol to carvedilol.  He reports no new symptoms.  No chest pain, shortness of breath or leg edema.  Past Medical History:  Diagnosis Date  . CKD (chronic kidney disease) stage 3, GFR 30-59 ml/min (HCC)    baseline Cr around 2.5  . Claudication ([place])    a. 08/2015 ABI/duplex: R: 0.86, L 0.96. Duplex w/ bilateral heterogeneous plaque in mid fem arteries, no significant stenoses.  . Coronary atherosclerosis of native coronary artery    a. 02/2011 Late presentation MI-->Myoview w/ large area of transmural infarct in LCX distribution, no ischemia.  . Depressive disorder, not elsewhere classified   . Esophageal reflux   . Gout, unspecified   . Hemorrhoids     a. 01/2016 s/p hemorrhoidectomy.  [name] History of DVT (deep vein thrombosis)    a. Chronic Eliquis.  . Hypertension   . Hypotension, unspecified   . Ischemic cardiomyopathy    a. 04/2012 Echo: EF 40%, inflat AK, Gr1 DD, mild AI/MR, triv TR.  . 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 04/2012 Echo: EF 40%, mild MR.  . Myocardial infarction (lateral wall) ([place]) 2013   a. 02/2011 - late presentation. Managed conservatively 2/2 CKD;  b. 02/2011 Myoview:  Large transmural infarct in the LCX distribution, no ischemia.  . Other and unspecified hyperlipidemia   . Personal history of tobacco use, presenting hazards to health   . Torn ligament   . Unspecified schizophrenia, unspecified condition     Past Surgical History:  Procedure Laterality Date  . COLONOSCOPY    . COLONOSCOPY N/A 11/10/2014   Procedure: COLONOSCOPY;  Surgeon: Manus Gunning, MD;  Location: [facility] ENDOSCOPY;  Service: Gastroenterology;  Laterality: N/A;  . COLONOSCOPY WITH PROPOFOL N/A 05/08/2015   Procedure: COLONOSCOPY WITH PROPOFOL;  Surgeon: [name], MD;  Location: [facility] ENDO SUITE;  Service: Endoscopy;  Laterality: N/A;  1215 - office states unable to move patient  . ESOPHAGOGASTRODUODENOSCOPY (EGD) WITH PROPOFOL N/A 05/08/2015   Procedure: ESOPHAGOGASTRODUODENOSCOPY (EGD) WITH PROPOFOL;  Surgeon: [name], MD;  Location: [facility] ENDO SU[facility];  Service: Endoscopy;  Laterality: N/A;  . EYE SURGERY Right    removal of foreign body  .  HEMORRHOID SURGERY N/A 02/20/2016   Procedure: EXTENSIVE HEMORRHOIDECTOMY;  Surgeon: [name], MD;  Location: [facility] ORS;  Service: General;  Laterality: N/A;  . None    . POLYPECTOMY  05/08/2015   Procedure: POLYPECTOMY;  Surgeon: [name], MD;  Location: [facility] ENDO SUITE;  Service: Endoscopy;;  transverse colon polyp     Current Outpatient Medications  Medication Sig Dispense Refill  . amLODipine (NORVASC) 10 MG tablet Take 10 mg by mouth daily.    [name] apixaban (ELIQUIS) 5 MG TABS tablet  Take 1 tablet (5 mg total) by mouth 2 (two) times daily. 180 tablet 3  . ARIPiprazole (ABILIFY) 20 MG tablet Take 20 mg by mouth 2 (two) times daily.    [name] buPROPion (WELLBUTRIN) 100 MG tablet Take 200 mg by mouth 2 (two) times daily. Takes 2 tablets at bedtime    . carvedilol (COREG) 25 MG tablet Take 1 tablet (25 mg total) by mouth 2 (two) times daily. 180 tablet 1  . COLCRYS 0.6 MG tablet Take 0.6 mg by mouth every evening.     [name] esomeprazole (NEXIUM) 20 MG capsule Take 20 mg by mouth daily as needed (gurd).     . hydrALAZINE (APRESOLINE) 100 MG tablet Take 100 mg by mouth 3 (three) times daily.     . isosorbide mononitrate (IMDUR) 60 MG 24 hr tablet TAKE 1 TABLET EVERY MORNING 90 tablet 0  . LORazepam (ATIVAN) 1 MG tablet Take 2 mg by mouth at bedtime.     . nitroGLYCERIN (NITROSTAT) 0.4 MG SL tablet Place 1 tablet (0.4 mg total) under the tongue every 5 (five) minutes as needed for chest pain. 25 tablet 3  . QUEtiapine (SEROQUEL) 300 MG tablet Take 300 mg by mouth at bedtime.     . sertraline (ZOLOFT) 100 MG tablet Take 200 mg by mouth daily.     . simvastatin (ZOCOR) 40 MG tablet TAKE 1 TABLET AT BEDTIME (NEEDS AN APPOINTMENT FOR FURTHER REFILLS, [phone]) 30 tablet 3  . spironolactone (ALDACTONE) 25 MG tablet Take 50 mg by mouth daily. Takes 2 tablets at bedtime     No current facility-administered medications for this visit.     Allergies:   Nifedipine; Rabeprazole; Benazepril; Haloperidol; Haloperidol and related; and Omeprazole    Social History:  The patient  reports that he quit smoking about 21 years ago. His smoking use included cigarettes. He has a 23.00 pack-year smoking history. He has never used smokeless tobacco. He reports that he does not drink alcohol or use drugs.   Family History:  The patient's family history is not on file.    ROS:  Please see the history of present illness.   Otherwise, review of systems are positive for none.   All other systems are reviewed  and negative.    PHYSICAL EXAM: VS:  BP (!) 146/88 (BP Location: Left Arm, Patient Position: Sitting, Cuff Size: Large)   Pulse 76   Ht 5\' 8"  (1.727 m)   Wt 210 lb 8 oz (95.5 kg)   BMI 32.01 kg/m  , BMI Body mass index is 32.01 kg/m. GEN: Well nourished, well developed, in no acute distress  HEENT: normal  Neck: no JVD, carotid bruits, or masses Cardiac: RRR; no murmurs, rubs, or gallops,no edema  Respiratory:  clear to auscultation bilaterally, normal work of breathing GI: soft, nontender, nondistended, + BS MS: no deformity or atrophy  Skin: warm and dry, no rash Neuro:  Strength and sensation are intact Psych: euthymic  mood, full affect   EKG:  EKG is ordered today. The ekg ordered today demonstrates normal sinus rhythm with lateral T wave changes suggestive of ischemia.  Recent Labs: No results found for requested labs within last 8760 hours.    Lipid Panel    Component Value Date/Time   CHOL 191 11/09/2014 0525   TRIG 117 11/09/2014 0525   HDL 45 11/09/2014 0525   CHOLHDL 4.2 11/09/2014 0525   VLDL 23 11/09/2014 0525   LDLCALC 123 (H) 11/09/2014 0525      Wt Readings from Last 3 Encounters:  02/02/18 210 lb 8 oz (95.5 kg)  11/05/17 213 lb 4 oz (96.7 kg)  01/12/17 223 lb (101.2 kg)       ASSESSMENT AND PLAN:  1.  Coronary artery disease involving native coronary arteries without angina: He continues to be clinically stable with no new symptoms.  Continue medical therapy.  2. Chronic systolic heart failure: Most recent ejection fraction was 35 to 40%.  I recently switch him from metoprolol to carvedilol and he seems to be tolerating this well.  Continue a combination of hydralazine/Imdur. He is not on ACE inhibitor or ARB due to chronic kidney disease.  He is on spironolactone and renal function has been stable on this.   3. Essential hypertension: Blood pressure is reasonably controlled on current medications.  4. History of right leg DVT: Currently on  anticoagulation with Eliquis.  5. Hyperlipidemia: Currently on simvastatin daily.  Most recent lipid profile in November 2018 showed a total cholesterol of 169, triglyceride 145, HDL of 46 and an LDL of 94.  We should consider switching him to atorvastatin or rosuvastatin to achieve an LDL below 70.   Disposition:   FU with me in 6 months  Signed,  Kathlyn Sacramento, MD  02/02/2018 2:03 PM    [facility]

## 2018-02-02 NOTE — Patient Instructions (Signed)
Medication Instructions:  Your physician recommends that you continue on your current medications as directed. Please refer to the Current Medication list given to you today.  If you need a refill on your cardiac medications before your next appointment, please call your pharmacy.   Lab work: Your physician recommends that you return for lab work in: None ordered   If you have labs (blood work) drawn today and your tests are completely normal, you will receive your results only by: [name] MyChart Message (if you have MyChart) OR . A paper copy in the mail If you have any lab test that is abnormal or we need to change your treatment, we will call you to review the results.  Testing/Procedures: None ordered   Follow-Up: At [facility], you and your health needs are our priority.  As part of our continuing mission to provide you with exceptional heart care, we have created designated Provider Care Teams.  These Care Teams include your primary Cardiologist (physician) and Advanced Practice Providers (APPs -  Physician Assistants and Nurse Practitioners) who all work together to provide you with the care you need, when you need it. You will need a follow up appointment in 6 months.  Please call our office 2 months in advance to schedule this appointment.  You may see No primary care provider on file. or one of the following Advanced Practice Providers on your designated Care Team:   Murray Hodgkins, NP Christell Faith, PA-C . Marrianne Mood, PA-C

## 2018-02-02 NOTE — Telephone Encounter (Signed)
The patient came for an office visit and this was discussed.

## 2018-02-05 NOTE — Addendum Note (Signed)
Addended by: Britt Bottom on: 02/05/2018 04:26 PM   Modules accepted: Orders

## 2018-03-17 DIAGNOSIS — I10 Essential (primary) hypertension: Secondary | ICD-10-CM | POA: Diagnosis not present

## 2018-03-17 DIAGNOSIS — E7800 Pure hypercholesterolemia, unspecified: Secondary | ICD-10-CM | POA: Diagnosis not present

## 2018-04-16 DIAGNOSIS — I10 Essential (primary) hypertension: Secondary | ICD-10-CM | POA: Diagnosis not present

## 2018-04-16 DIAGNOSIS — E7800 Pure hypercholesterolemia, unspecified: Secondary | ICD-10-CM | POA: Diagnosis not present

## 2018-04-21 DIAGNOSIS — K219 Gastro-esophageal reflux disease without esophagitis: Secondary | ICD-10-CM | POA: Diagnosis not present

## 2018-04-21 DIAGNOSIS — N184 Chronic kidney disease, stage 4 (severe): Secondary | ICD-10-CM | POA: Diagnosis not present

## 2018-04-21 DIAGNOSIS — I10 Essential (primary) hypertension: Secondary | ICD-10-CM | POA: Diagnosis not present

## 2018-04-21 DIAGNOSIS — Z299 Encounter for prophylactic measures, unspecified: Secondary | ICD-10-CM | POA: Diagnosis not present

## 2018-04-21 DIAGNOSIS — I429 Cardiomyopathy, unspecified: Secondary | ICD-10-CM | POA: Diagnosis not present

## 2018-04-21 DIAGNOSIS — F200 Paranoid schizophrenia: Secondary | ICD-10-CM | POA: Diagnosis not present

## 2018-04-21 DIAGNOSIS — I5022 Chronic systolic (congestive) heart failure: Secondary | ICD-10-CM | POA: Diagnosis not present

## 2018-04-21 DIAGNOSIS — Z6829 Body mass index (BMI) 29.0-29.9, adult: Secondary | ICD-10-CM | POA: Diagnosis not present

## 2018-04-30 ENCOUNTER — Other Ambulatory Visit: Payer: Self-pay | Admitting: Cardiovascular Disease

## 2018-05-14 DIAGNOSIS — H2513 Age-related nuclear cataract, bilateral: Secondary | ICD-10-CM | POA: Diagnosis not present

## 2018-05-14 DIAGNOSIS — H442C1 Degenerative myopia with retinal detachment, right eye: Secondary | ICD-10-CM | POA: Diagnosis not present

## 2018-05-14 DIAGNOSIS — I10 Essential (primary) hypertension: Secondary | ICD-10-CM | POA: Diagnosis not present

## 2018-05-14 DIAGNOSIS — E7800 Pure hypercholesterolemia, unspecified: Secondary | ICD-10-CM | POA: Diagnosis not present

## 2018-05-14 DIAGNOSIS — H33022 Retinal detachment with multiple breaks, left eye: Secondary | ICD-10-CM | POA: Diagnosis not present

## 2018-07-02 DIAGNOSIS — I10 Essential (primary) hypertension: Secondary | ICD-10-CM | POA: Diagnosis not present

## 2018-07-02 DIAGNOSIS — E7800 Pure hypercholesterolemia, unspecified: Secondary | ICD-10-CM | POA: Diagnosis not present

## 2018-07-30 DIAGNOSIS — I10 Essential (primary) hypertension: Secondary | ICD-10-CM | POA: Diagnosis not present

## 2018-07-30 DIAGNOSIS — E7800 Pure hypercholesterolemia, unspecified: Secondary | ICD-10-CM | POA: Diagnosis not present

## 2018-09-16 DIAGNOSIS — Z299 Encounter for prophylactic measures, unspecified: Secondary | ICD-10-CM | POA: Diagnosis not present

## 2018-09-16 DIAGNOSIS — I5022 Chronic systolic (congestive) heart failure: Secondary | ICD-10-CM | POA: Diagnosis not present

## 2018-09-16 DIAGNOSIS — N184 Chronic kidney disease, stage 4 (severe): Secondary | ICD-10-CM | POA: Diagnosis not present

## 2018-09-16 DIAGNOSIS — I429 Cardiomyopathy, unspecified: Secondary | ICD-10-CM | POA: Diagnosis not present

## 2018-09-16 DIAGNOSIS — Z6830 Body mass index (BMI) 30.0-30.9, adult: Secondary | ICD-10-CM | POA: Diagnosis not present

## 2018-09-16 DIAGNOSIS — I10 Essential (primary) hypertension: Secondary | ICD-10-CM | POA: Diagnosis not present

## 2018-10-08 DIAGNOSIS — E7800 Pure hypercholesterolemia, unspecified: Secondary | ICD-10-CM | POA: Diagnosis not present

## 2018-10-08 DIAGNOSIS — I10 Essential (primary) hypertension: Secondary | ICD-10-CM | POA: Diagnosis not present

## 2018-11-02 DIAGNOSIS — Z299 Encounter for prophylactic measures, unspecified: Secondary | ICD-10-CM | POA: Diagnosis not present

## 2018-11-02 DIAGNOSIS — I5022 Chronic systolic (congestive) heart failure: Secondary | ICD-10-CM | POA: Diagnosis not present

## 2018-11-02 DIAGNOSIS — M109 Gout, unspecified: Secondary | ICD-10-CM | POA: Diagnosis not present

## 2018-11-02 DIAGNOSIS — Z6830 Body mass index (BMI) 30.0-30.9, adult: Secondary | ICD-10-CM | POA: Diagnosis not present

## 2018-11-02 DIAGNOSIS — N189 Chronic kidney disease, unspecified: Secondary | ICD-10-CM | POA: Diagnosis not present

## 2018-11-02 DIAGNOSIS — I10 Essential (primary) hypertension: Secondary | ICD-10-CM | POA: Diagnosis not present

## 2018-12-17 DIAGNOSIS — M7022 Olecranon bursitis, left elbow: Secondary | ICD-10-CM | POA: Diagnosis not present

## 2018-12-17 DIAGNOSIS — T560X1A Toxic effect of lead and its compounds, accidental (unintentional), initial encounter: Secondary | ICD-10-CM | POA: Diagnosis not present

## 2018-12-20 DIAGNOSIS — Z23 Encounter for immunization: Secondary | ICD-10-CM | POA: Diagnosis not present

## 2019-01-10 DIAGNOSIS — Z87891 Personal history of nicotine dependence: Secondary | ICD-10-CM | POA: Diagnosis not present

## 2019-01-10 DIAGNOSIS — I5022 Chronic systolic (congestive) heart failure: Secondary | ICD-10-CM | POA: Diagnosis not present

## 2019-01-10 DIAGNOSIS — Z6831 Body mass index (BMI) 31.0-31.9, adult: Secondary | ICD-10-CM | POA: Diagnosis not present

## 2019-01-10 DIAGNOSIS — I429 Cardiomyopathy, unspecified: Secondary | ICD-10-CM | POA: Diagnosis not present

## 2019-01-10 DIAGNOSIS — Z299 Encounter for prophylactic measures, unspecified: Secondary | ICD-10-CM | POA: Diagnosis not present

## 2019-01-10 DIAGNOSIS — I10 Essential (primary) hypertension: Secondary | ICD-10-CM | POA: Diagnosis not present

## 2019-01-10 DIAGNOSIS — M109 Gout, unspecified: Secondary | ICD-10-CM | POA: Diagnosis not present

## 2019-01-28 DIAGNOSIS — I10 Essential (primary) hypertension: Secondary | ICD-10-CM | POA: Diagnosis not present

## 2019-01-28 DIAGNOSIS — E7800 Pure hypercholesterolemia, unspecified: Secondary | ICD-10-CM | POA: Diagnosis not present

## 2019-02-22 DIAGNOSIS — I10 Essential (primary) hypertension: Secondary | ICD-10-CM | POA: Diagnosis not present

## 2019-02-22 DIAGNOSIS — R5383 Other fatigue: Secondary | ICD-10-CM | POA: Diagnosis not present

## 2019-02-22 DIAGNOSIS — Z1211 Encounter for screening for malignant neoplasm of colon: Secondary | ICD-10-CM | POA: Diagnosis not present

## 2019-02-22 DIAGNOSIS — I5022 Chronic systolic (congestive) heart failure: Secondary | ICD-10-CM | POA: Diagnosis not present

## 2019-02-22 DIAGNOSIS — Z1331 Encounter for screening for depression: Secondary | ICD-10-CM | POA: Diagnosis not present

## 2019-02-22 DIAGNOSIS — Z7189 Other specified counseling: Secondary | ICD-10-CM | POA: Diagnosis not present

## 2019-02-22 DIAGNOSIS — Z1339 Encounter for screening examination for other mental health and behavioral disorders: Secondary | ICD-10-CM | POA: Diagnosis not present

## 2019-02-22 DIAGNOSIS — Z299 Encounter for prophylactic measures, unspecified: Secondary | ICD-10-CM | POA: Diagnosis not present

## 2019-02-22 DIAGNOSIS — E7800 Pure hypercholesterolemia, unspecified: Secondary | ICD-10-CM | POA: Diagnosis not present

## 2019-02-22 DIAGNOSIS — Z0000 Encounter for general adult medical examination without abnormal findings: Secondary | ICD-10-CM | POA: Diagnosis not present

## 2019-02-22 DIAGNOSIS — Z6830 Body mass index (BMI) 30.0-30.9, adult: Secondary | ICD-10-CM | POA: Diagnosis not present

## 2019-03-01 DIAGNOSIS — I10 Essential (primary) hypertension: Secondary | ICD-10-CM | POA: Diagnosis not present

## 2019-03-01 DIAGNOSIS — E7800 Pure hypercholesterolemia, unspecified: Secondary | ICD-10-CM | POA: Diagnosis not present

## 2019-03-03 DIAGNOSIS — I10 Essential (primary) hypertension: Secondary | ICD-10-CM | POA: Diagnosis not present

## 2019-03-03 DIAGNOSIS — Z87891 Personal history of nicotine dependence: Secondary | ICD-10-CM | POA: Diagnosis not present

## 2019-03-03 DIAGNOSIS — Z6830 Body mass index (BMI) 30.0-30.9, adult: Secondary | ICD-10-CM | POA: Diagnosis not present

## 2019-03-03 DIAGNOSIS — B839 Helminthiasis, unspecified: Secondary | ICD-10-CM | POA: Diagnosis not present

## 2019-03-03 DIAGNOSIS — E7800 Pure hypercholesterolemia, unspecified: Secondary | ICD-10-CM | POA: Diagnosis not present

## 2019-03-03 DIAGNOSIS — Z299 Encounter for prophylactic measures, unspecified: Secondary | ICD-10-CM | POA: Diagnosis not present

## 2019-04-19 DIAGNOSIS — I10 Essential (primary) hypertension: Secondary | ICD-10-CM | POA: Diagnosis not present

## 2019-04-19 DIAGNOSIS — E7800 Pure hypercholesterolemia, unspecified: Secondary | ICD-10-CM | POA: Diagnosis not present

## 2019-04-25 DIAGNOSIS — Z299 Encounter for prophylactic measures, unspecified: Secondary | ICD-10-CM | POA: Diagnosis not present

## 2019-04-25 DIAGNOSIS — F200 Paranoid schizophrenia: Secondary | ICD-10-CM | POA: Diagnosis not present

## 2019-04-25 DIAGNOSIS — Z6830 Body mass index (BMI) 30.0-30.9, adult: Secondary | ICD-10-CM | POA: Diagnosis not present

## 2019-04-25 DIAGNOSIS — I82409 Acute embolism and thrombosis of unspecified deep veins of unspecified lower extremity: Secondary | ICD-10-CM | POA: Diagnosis not present

## 2019-04-25 DIAGNOSIS — D6869 Other thrombophilia: Secondary | ICD-10-CM | POA: Diagnosis not present

## 2019-04-25 DIAGNOSIS — I10 Essential (primary) hypertension: Secondary | ICD-10-CM | POA: Diagnosis not present

## 2019-05-11 DIAGNOSIS — Z20822 Contact with and (suspected) exposure to covid-19: Secondary | ICD-10-CM | POA: Diagnosis not present

## 2019-05-16 DIAGNOSIS — E7800 Pure hypercholesterolemia, unspecified: Secondary | ICD-10-CM | POA: Diagnosis not present

## 2019-05-16 DIAGNOSIS — I10 Essential (primary) hypertension: Secondary | ICD-10-CM | POA: Diagnosis not present

## 2019-06-13 DIAGNOSIS — I10 Essential (primary) hypertension: Secondary | ICD-10-CM | POA: Diagnosis not present

## 2019-06-13 DIAGNOSIS — E7800 Pure hypercholesterolemia, unspecified: Secondary | ICD-10-CM | POA: Diagnosis not present

## 2019-06-22 DIAGNOSIS — M109 Gout, unspecified: Secondary | ICD-10-CM | POA: Diagnosis not present

## 2019-06-22 DIAGNOSIS — Z299 Encounter for prophylactic measures, unspecified: Secondary | ICD-10-CM | POA: Diagnosis not present

## 2019-06-22 DIAGNOSIS — I10 Essential (primary) hypertension: Secondary | ICD-10-CM | POA: Diagnosis not present

## 2019-07-24 DIAGNOSIS — E7800 Pure hypercholesterolemia, unspecified: Secondary | ICD-10-CM | POA: Diagnosis not present

## 2019-07-24 DIAGNOSIS — I10 Essential (primary) hypertension: Secondary | ICD-10-CM | POA: Diagnosis not present

## 2019-08-02 ENCOUNTER — Other Ambulatory Visit: Payer: Self-pay

## 2019-08-02 ENCOUNTER — Encounter: Payer: Self-pay | Admitting: Family

## 2019-08-02 ENCOUNTER — Ambulatory Visit (INDEPENDENT_AMBULATORY_CARE_PROVIDER_SITE_OTHER): Payer: Medicare Other | Admitting: Family

## 2019-08-02 VITALS — BP 106/70 | HR 74 | Ht 68.0 in | Wt 217.0 lb

## 2019-08-02 DIAGNOSIS — I5020 Unspecified systolic (congestive) heart failure: Secondary | ICD-10-CM | POA: Diagnosis not present

## 2019-08-02 DIAGNOSIS — I739 Peripheral vascular disease, unspecified: Secondary | ICD-10-CM

## 2019-08-02 DIAGNOSIS — E785 Hyperlipidemia, unspecified: Secondary | ICD-10-CM

## 2019-08-02 DIAGNOSIS — I5022 Chronic systolic (congestive) heart failure: Secondary | ICD-10-CM | POA: Diagnosis not present

## 2019-08-02 NOTE — Patient Instructions (Addendum)
Medication Instructions:  No medication changes today.   *If you need a refill on your cardiac medications before your next appointment, please call your pharmacy*  Lab Work: Your physician recommends that you return for lab work today: CMET, CBC, lipid panel, direct LDL  If you have labs (blood work) drawn today and your tests are completely normal, you will receive your results only by: [name] MyChart Message (if you have MyChart) OR . A paper copy in the mail If you have any lab test that is abnormal or we need to change your treatment, we will call you to review the results.   Testing/Procedures:  Your physician has requested that you have an echocardiogram. Echocardiography is a painless test that uses sound waves to create images of your heart. It provides your doctor with information about the size and shape of your heart and how well your heart's chambers and valves are working. This procedure takes approximately one hour. There are no restrictions for this procedure.  Your physician has requested that you have an ankle brachial index (ABI). During this test an ultrasound and blood pressure cuff are used to evaluate the arteries that supply the arms and legs with blood. Allow thirty minutes for this exam. There are no restrictions or special instructions.  Your physician has requested that you have a lower extremity arterial duplex. During this test ultrasound are used to evaluate arterial blood flow in the legs. Allow one hour for this exam. There are no restrictions or special instructions.   Follow-Up: At [facility], you and your health needs are our priority.  As part of our continuing mission to provide you with exceptional heart care, we have created designated Provider Care Teams.  These Care Teams include your primary Cardiologist (physician) and Advanced Practice Providers (APPs -  Physician Assistants and Nurse Practitioners) who all work together to provide you with the care  you need, when you need it.  We recommend signing up for the patient portal called "MyChart".  Sign up information is provided on this After Visit Summary.  MyChart is used to connect with patients for Virtual Visits (Telemedicine).  Patients are able to view lab/test results, encounter notes, upcoming appointments, etc.  Non-urgent messages can be sent to your provider as well.   To learn more about what you can do with MyChart, go to [email or web address].    Your next appointment:   2 month(s)  The format for your next appointment:   In Person  Provider:    You may see Kathlyn Sacramento, MD or one of the following Advanced Practice Providers on your designated Care Team:    Murray Hodgkins, NP  Christell Faith, PA-C  Marrianne Mood, PA-C  Laurann Montana, NP   Other Instructions  Recommend a regular walking regimen.   Please bring your medications to your next office visit.

## 2019-08-02 NOTE — Progress Notes (Signed)
Office Visit    Patient Name: Glenn Hawkins Date of Encounter: 08/02/2019  Primary Care Provider:  Glenda Chroman, MD Primary Cardiologist:  No primary care provider on file. Electrophysiologist:  None   Chief Complaint    Glenn Hawkins is a 64 y.o. male with a hx of CAD, CKD, extensive DVT on chronic anticoagulation, PAD, HFrEF presents today for follow up of CAD   Past Medical History    Past Medical History:  Diagnosis Date  . CKD (chronic kidney disease) stage 3, GFR 30-59 ml/min    baseline Cr around 2.5  . Claudication ([place])    a. 08/2015 ABI/duplex: R: 0.86, L 0.96. Duplex w/ bilateral heterogeneous plaque in mid fem arteries, no significant stenoses.  . Coronary atherosclerosis of native coronary artery    a. 02/2011 Late presentation MI-->Myoview w/ large area of transmural infarct in LCX distribution, no ischemia.  . Depressive disorder, not elsewhere classified   . Esophageal reflux   . Gout, unspecified   . Hemorrhoids    a. 01/2016 s/p hemorrhoidectomy.  [name] History of DVT (deep vein thrombosis)    a. Chronic Eliquis.  . Hypertension   . Hypotension, unspecified   . Ischemic cardiomyopathy    a. 04/2012 Echo: EF 40%, inflat AK, Gr1 DD, mild AI/MR, triv TR.  . 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 04/2012 Echo: EF 40%, mild MR.  . Myocardial infarction (lateral wall) ([place]) 2013   a. 02/2011 - late presentation. Managed conservatively 2/2 CKD;  b. 02/2011 Myoview:  Large transmural infarct in the LCX distribution, no ischemia.  . Other and unspecified hyperlipidemia   . Personal history of tobacco use, presenting hazards to health   . Torn ligament   . Unspecified schizophrenia, unspecified condition    Past Surgical History:  Procedure Laterality Date  . COLONOSCOPY    . COLONOSCOPY N/A 11/10/2014   Procedure: COLONOSCOPY;  Surgeon: Manus Gunning, MD;  Location: [facility] ENDOSCOPY;  Service: Gastroenterology;  Laterality: N/A;  . COLONOSCOPY WITH PROPOFOL N/A  05/08/2015   Procedure: COLONOSCOPY WITH PROPOFOL;  Surgeon: [name], MD;  Location: [facility] ENDO SU[facility];  Service: Endoscopy;  Laterality: N/A;  1215 - office states unable to move patient  . ESOPHAGOGASTRODUODENOSCOPY (EGD) WITH PROPOFOL N/A 05/08/2015   Procedure: ESOPHAGOGASTRODUODENOSCOPY (EGD) WITH PROPOFOL;  Surgeon: [name], MD;  Location: [facility] END[facility];  Service: Endoscopy;  Laterality: N/A;  . EYE SURGERY Right    removal of foreign body  . HEMORRHOID SURGERY N/A 02/20/2016   Procedure: EXTENSIVE HEMORRHOIDECTOMY;  Surgeon: [name], MD;  Location: [facility] ORS;  Service: General;  Laterality: N/A;  . None    . POLYPECTOMY  05/08/2015   Procedure: POLYPECTOMY;  Surgeon: [name], MD;  Location: [facility] ENDO SUITE;  Service: Endoscopy;;  transverse colon polyp    Allergies  Allergies  Allergen Reactions  . Nifedipine Diarrhea  . Rabeprazole Other (See Comments)    unknown  . Benazepril Other (See Comments)    unknown  . Haloperidol Anxiety    Causes severe anxiety  . Haloperidol And Related Other (See Comments)    anxiety  . Omeprazole Other (See Comments)    unknown    History of Present Illness    Glenn Hawkins is a 64 y.o. male with a hx of CAD, CKD, extensive DVT on chronic anticoagulation, PAD, HFrEF. He was last seen 01/2018 by Dr. Fletcher Anon.  CAD is s/p known MI with late  presentation. He was treated medically and did not have catheterization due to CKD (creatinine around 2.5) and late presentation. Nuclear stress test January 2013 with large transmural infarct in LCx distribution without ischemia. Echo with  EF 40-45%, moderate MR. Extensive RLE DVT in 09/2014 and has been on anticoagulation since. Noted mild bilateral calf claudication due to PAD previously followed by Dr. Oneida Alar, last visit 2018. Echo 11/2017 with LVEF 35-40%, mild AI, MR. He was recommended to switch from Metoprolol to Coreg.   Endorsees eating low sodium diet. Reports no shortness of breath.  Will get dyspneic on exertion with stairs. Recovers after a few seconds. Per his report this is stable at his baseline. Reports no chest pain, pressure, or tightness. No edema, orthopnea, PND. Reports no palpitations. No lightheadedness, dizziness - does not monitor BP at home.   Follows with primary care as well as the [facility]. Difficulty reconciling medications today - CMA assisted to call 2 pharmacies but neither fill the majority of his medications. He then shared that he gets medications through [place] - their records unavailable during our visit, but will be requested. Reports no recent lab work or echocardiogram.   EKGs/Labs/Other Studies Reviewed:   The following studies were reviewed today: Echo 11/2017 Left ventricle: The cavity size was mildly dilated. Systolic    function was moderately to severely reduced. The estimated    ejection fraction was in the range of 30% to 35%. Hypokinesis of    the inferior myocardium. Hypokinesis of the inferolateral    myocardium.  - Aortic valve: There was mild regurgitation.  - Mitral valve: There was mild regurgitation.  - Left atrium: The atrium was mildly dilated.  - Right ventricle: Systolic function was normal.  - Pulmonary arteries: Systolic pressure was within the normal    range.   EKG:  EKG is ordered today.  The ekg ordered today demonstrates NSR 74 bpm with stable TWI in anterolateral leads.   Recent Labs: No results found for requested labs within last 8760 hours.  Recent Lipid Panel    Component Value Date/Time   CHOL 191 11/09/2014 0525   TRIG 117 11/09/2014 0525   HDL 45 11/09/2014 0525   CHOLHDL 4.2 11/09/2014 0525   VLDL 23 11/09/2014 0525   LDLCALC 123 (H) 11/09/2014 0525    Home Medications   Current Meds  Medication Sig  . amLODipine (NORVASC) 10 MG tablet Take 10 mg by mouth daily.  [name] apixaban (ELIQUIS) 5 MG TABS tablet Take 1 tablet (5 mg total) by mouth 2 (two) times daily.  . ARIPiprazole (ABILIFY) 20  MG tablet Take 20 mg by mouth 2 (two) times daily.  [name] buPROPion (WELLBUTRIN) 100 MG tablet Take 200 mg by mouth 2 (two) times daily. Takes 2 tablets at bedtime  . cholecalciferol (VITAMIN D) 25 MCG (1000 UNIT) tablet Take 2 tablets every other day  . COLCRYS 0.6 MG tablet Take 0.6 mg by mouth 2 (two) times daily.   [name] esomeprazole (NEXIUM) 20 MG capsule Take 20 mg by mouth daily as needed (gurd).   . hydrALAZINE (APRESOLINE) 100 MG tablet Take 100 mg by mouth 3 (three) times daily.   . isosorbide mononitrate (IMDUR) 60 MG 24 hr tablet TAKE 1 TABLET EVERY MORNING  . LORazepam (ATIVAN) 1 MG tablet Take 2 mg by mouth at bedtime.   . metoprolol tartrate (LOPRESSOR) 100 MG tablet Take 100 mg by mouth 2 (two) times daily.  . nitroGLYCERIN (NITROSTAT) 0.4 MG SL tablet Place  1 tablet (0.4 mg total) under the tongue every 5 (five) minutes as needed for chest pain.  [name] QUEtiapine (SEROQUEL) 400 MG tablet Take 400 mg by mouth at bedtime.   . sertraline (ZOLOFT) 100 MG tablet Take 200 mg by mouth daily.   . sildenafil (VIAGRA) 50 MG tablet Take 50 mg by mouth daily as needed for erectile dysfunction.  . simvastatin (ZOCOR) 40 MG tablet TAKE 1 TABLET AT BEDTIME (NEEDS AN APPOINTMENT FOR FURTHER REFILLS, [phone])  . spironolactone (ALDACTONE) 25 MG tablet Take 50 mg by mouth daily. Takes 2 tablets at bedtime      Review of Systems   Review of Systems  Constitution: Negative for chills, fever and malaise/fatigue.  Cardiovascular: Positive for claudication (RLE). Negative for chest pain, dyspnea on exertion, irregular heartbeat, leg swelling, near-syncope, orthopnea, palpitations and syncope.  Respiratory: Negative for cough, shortness of breath and wheezing.   Gastrointestinal: Negative for melena, nausea and vomiting.  Genitourinary: Negative for hematuria.  Neurological: Negative for dizziness, light-headedness and weakness.   All other systems reviewed and are otherwise negative except as noted  above.  Physical Exam    VS:  BP 106/70 (BP Location: Left Arm, Patient Position: Sitting, Cuff Size: Normal)   Pulse 74   Ht 5\' 8"  (1.727 m)   Wt 217 lb (98.4 kg)   SpO2 96%   BMI 32.99 kg/m  , BMI Body mass index is 32.99 kg/m. GEN: Well nourished, overweight, well developed, in no acute distress. HEENT: normal. Neck: Supple, no JVD, carotid bruits, or masses. Cardiac: RRR, no murmurs, rubs, or gallops. No clubbing, cyanosis, edema.  Radials/PT 2+ and equal bilaterally.  Respiratory:  Respirations regular and unlabored, clear to auscultation bilaterally. GI: Soft, nontender, nondistended, BS + x 4. MS: No deformity or atrophy. Skin: Warm and dry, no rash. Neuro:  Strength and sensation are intact. Psych: Normal affect.  Assessment & Plan    1. CAD- Stable with no anginal symptoms. No indication for ischemic evaluation at this time. Continue beta blocker, statin, imdur. No aspirin secondary to chronic anticoagulation.   2. HFrEF - NYHA II. Most recent EF 35-40% in 2019. Plan to update echocardiogram. GDMT difficult to ascertain as some confusion regarding his medications, requested records from [place]. Called 2 pharmacies who were both unable to verify his medications. Per his report previously intolerant of Coreg (unclear reaction) but tolerates Metoprolol. Continue Hydralazine, Imdur. No ACE/ARB/ARNI due to CKD. Endorses low sodium diet. BMET today.   3. HTN - BP low today. Reports no lightheadedness, dizziness, near syncope. Consider reduction of antihypertensive agents at follow up due to low BP. Unable to adjust today as medication list is unclear. He will bring pill bottles to next visit.   4. Hx of RLE DVT - Continue Eliquis 5mg  BID per primary care. Denies bleeding complications. CBC today.   5. HLD, LDL goal <70 - No recent lipid panel available for review. Lipid panel, direct LDL today. Continue Zocor. Anticipate will need to transition to Atorvastatin or Crestor to reach LDL  goal.   6. PAD - Reports worsening claudication symptoms in RLE. Previously evaluated by Dr. Oneida Alar, last seen 2018. Palpable bilateral DP. Plan for updated LEA/ABI.   Disposition: Echocardiogram/ABI/LEA. Follow up in 2 month(s) with Dr. Fletcher Anon or APP.    Loel Dubonnet, NP 08/02/2019, 2:21 PM

## 2019-08-03 ENCOUNTER — Telehealth: Payer: Self-pay | Admitting: *Deleted

## 2019-08-03 DIAGNOSIS — Z79899 Other long term (current) drug therapy: Secondary | ICD-10-CM

## 2019-08-03 DIAGNOSIS — E785 Hyperlipidemia, unspecified: Secondary | ICD-10-CM

## 2019-08-03 LAB — CBC WITH DIFFERENTIAL/PLATELET
Basophils Absolute: 0 10*3/uL (ref 0.0–0.2)
Basos: 0 %
EOS (ABSOLUTE): 0.1 10*3/uL (ref 0.0–0.4)
Eos: 2 %
Hematocrit: 41.8 % (ref 37.5–51.0)
Hemoglobin: 13.5 g/dL (ref 13.0–17.7)
Immature Grans (Abs): 0.1 10*3/uL (ref 0.0–0.1)
Immature Granulocytes: 1 %
Lymphocytes Absolute: 1.6 10*3/uL (ref 0.7–3.1)
Lymphs: 20 %
MCH: 30.1 pg (ref 26.6–33.0)
MCHC: 32.3 g/dL (ref 31.5–35.7)
MCV: 93 fL (ref 79–97)
Monocytes Absolute: 0.6 10*3/uL (ref 0.1–0.9)
Monocytes: 7 %
Neutrophils Absolute: 5.6 10*3/uL (ref 1.4–7.0)
Neutrophils: 70 %
Platelets: 145 10*3/uL — ABNORMAL LOW (ref 150–450)
RBC: 4.48 x10E6/uL (ref 4.14–5.80)
RDW: 13.6 % (ref 11.6–15.4)
WBC: 8 10*3/uL (ref 3.4–10.8)

## 2019-08-03 LAB — LIPID PANEL
Chol/HDL Ratio: 5.2 ratio — ABNORMAL HIGH (ref 0.0–5.0)
Cholesterol, Total: 191 mg/dL (ref 100–199)
HDL: 37 mg/dL — ABNORMAL LOW (ref >39)
LDL Chol Calc (NIH): 123 mg/dL — ABNORMAL HIGH (ref 0–99)
Triglycerides: 175 mg/dL — ABNORMAL HIGH (ref 0–149)
VLDL Cholesterol Cal: 31 mg/dL (ref 5–40)

## 2019-08-03 LAB — COMPREHENSIVE METABOLIC PANEL
ALT: 18 IU/L (ref 0–44)
AST: 21 IU/L (ref 0–40)
Albumin/Globulin Ratio: 1.8 (ref 1.2–2.2)
Albumin: 4 g/dL (ref 3.8–4.8)
Alkaline Phosphatase: 99 IU/L (ref 48–121)
BUN/Creatinine Ratio: 10 (ref 10–24)
BUN: 30 mg/dL — ABNORMAL HIGH (ref 8–27)
Bilirubin Total: 0.3 mg/dL (ref 0.0–1.2)
CO2: 17 mmol/L — ABNORMAL LOW (ref 20–29)
Calcium: 9.1 mg/dL (ref 8.6–10.2)
Chloride: 111 mmol/L — ABNORMAL HIGH (ref 96–106)
Creatinine, Ser: 2.93 mg/dL — ABNORMAL HIGH (ref 0.76–1.27)
GFR calc Af Amer: 25 mL/min/{1.73_m2} — ABNORMAL LOW (ref >59)
GFR calc non Af Amer: 22 mL/min/{1.73_m2} — ABNORMAL LOW (ref >59)
Globulin, Total: 2.2 g/dL (ref 1.5–4.5)
Glucose: 92 mg/dL (ref 65–99)
Potassium: 4.4 mmol/L (ref 3.5–5.2)
Sodium: 141 mmol/L (ref 134–144)
Total Protein: 6.2 g/dL (ref 6.0–8.5)

## 2019-08-03 LAB — LDL CHOLESTEROL, DIRECT: LDL Direct: 125 mg/dL — ABNORMAL HIGH (ref 0–99)

## 2019-08-03 MED ORDER — ROSUVASTATIN CALCIUM 20 MG PO TABS
20.0000 mg | ORAL_TABLET | Freq: Every day | ORAL | 2 refills | Status: DC
Start: 2019-08-03 — End: 2020-12-11

## 2019-08-03 NOTE — Telephone Encounter (Signed)
-----   Message from Loel Dubonnet, NP sent at 08/03/2019  8:00 AM EDT ----- CBC shows improvement in platelet count since 01/2019. Kidney function overall stable, continue to follow with nephrology. Potassium normal. Lipid panel shows LDL not at goal - please confirm what cholesterol medication he is taking - recommend transition to Crestor 20mg  daily and repeat liver/lipid panel in 8 weeks.  #Note, referenced labs from 01/2019 were obtained via [place].

## 2019-08-03 NOTE — Telephone Encounter (Signed)
Results called to pt. Pt verbalized understanding. Patient reports he has been taking his simvastatin. He is agreeable to stop simvastatin and switch to Crestor 20 mg daily. Rx sent to pharmacy. I had him write down and he verbalized understanding to get lab work in 8 weeks, around August 9th, 2021 at the [facility]. He is also aware to be fasting.

## 2019-08-24 DIAGNOSIS — I10 Essential (primary) hypertension: Secondary | ICD-10-CM | POA: Diagnosis not present

## 2019-08-24 DIAGNOSIS — E7800 Pure hypercholesterolemia, unspecified: Secondary | ICD-10-CM | POA: Diagnosis not present

## 2019-09-09 DIAGNOSIS — K5900 Constipation, unspecified: Secondary | ICD-10-CM | POA: Diagnosis not present

## 2019-09-09 DIAGNOSIS — D6869 Other thrombophilia: Secondary | ICD-10-CM | POA: Diagnosis not present

## 2019-09-09 DIAGNOSIS — Z299 Encounter for prophylactic measures, unspecified: Secondary | ICD-10-CM | POA: Diagnosis not present

## 2019-09-09 DIAGNOSIS — F200 Paranoid schizophrenia: Secondary | ICD-10-CM | POA: Diagnosis not present

## 2019-09-09 DIAGNOSIS — I10 Essential (primary) hypertension: Secondary | ICD-10-CM | POA: Diagnosis not present

## 2019-09-09 DIAGNOSIS — N184 Chronic kidney disease, stage 4 (severe): Secondary | ICD-10-CM | POA: Diagnosis not present

## 2019-09-09 DIAGNOSIS — Z6830 Body mass index (BMI) 30.0-30.9, adult: Secondary | ICD-10-CM | POA: Diagnosis not present

## 2019-09-14 NOTE — Addendum Note (Signed)
Addended by: Loel Dubonnet on: 09/14/2019 04:44 PM   Modules accepted: Orders

## 2019-09-15 ENCOUNTER — Other Ambulatory Visit: Payer: Self-pay | Admitting: Family

## 2019-09-15 ENCOUNTER — Ambulatory Visit (INDEPENDENT_AMBULATORY_CARE_PROVIDER_SITE_OTHER): Payer: Medicare Other

## 2019-09-15 ENCOUNTER — Other Ambulatory Visit: Payer: Self-pay

## 2019-09-15 DIAGNOSIS — I5020 Unspecified systolic (congestive) heart failure: Secondary | ICD-10-CM

## 2019-09-15 DIAGNOSIS — I739 Peripheral vascular disease, unspecified: Secondary | ICD-10-CM

## 2019-09-15 DIAGNOSIS — I429 Cardiomyopathy, unspecified: Secondary | ICD-10-CM | POA: Diagnosis not present

## 2019-09-15 DIAGNOSIS — M109 Gout, unspecified: Secondary | ICD-10-CM | POA: Diagnosis not present

## 2019-09-15 DIAGNOSIS — Z299 Encounter for prophylactic measures, unspecified: Secondary | ICD-10-CM | POA: Diagnosis not present

## 2019-09-15 DIAGNOSIS — N184 Chronic kidney disease, stage 4 (severe): Secondary | ICD-10-CM | POA: Diagnosis not present

## 2019-09-15 DIAGNOSIS — J45909 Unspecified asthma, uncomplicated: Secondary | ICD-10-CM | POA: Diagnosis not present

## 2019-09-15 DIAGNOSIS — I10 Essential (primary) hypertension: Secondary | ICD-10-CM | POA: Diagnosis not present

## 2019-09-15 LAB — ECHOCARDIOGRAM COMPLETE
AR max vel: 4.47 cm2
AV Area VTI: 4.46 cm2
AV Area mean vel: 4.25 cm2
AV Mean grad: 4 mmHg
AV Peak grad: 7.3 mmHg
Ao pk vel: 1.35 m/s
Area-P 1/2: 2.85 cm2
Calc EF: 32.6 %
S' Lateral: 4.2 cm
Single Plane A2C EF: 37.3 %
Single Plane A4C EF: 19.7 %

## 2019-09-19 ENCOUNTER — Telehealth: Payer: Self-pay | Admitting: Cardiovascular Disease

## 2019-09-19 NOTE — Telephone Encounter (Signed)
Spoke with the patient. Reviewed again with the patients his echo results and the patient recent LEA study results. Patients question answered. Advised the patient that the results will be reviewed again when he comes in to see Dr. Fletcher Anon on 10/04/19. Reminded the patient that he is to bring his medication with him to the appt. Patient verbalized understanding.

## 2019-09-19 NOTE — Telephone Encounter (Signed)
Patient calling  Patient does not remember much of the conversation that he had this morning with nurse  Would like to clarify the results of ECHO  Please call to discuss

## 2019-09-23 DIAGNOSIS — I10 Essential (primary) hypertension: Secondary | ICD-10-CM | POA: Diagnosis not present

## 2019-09-23 DIAGNOSIS — E7800 Pure hypercholesterolemia, unspecified: Secondary | ICD-10-CM | POA: Diagnosis not present

## 2019-09-27 ENCOUNTER — Other Ambulatory Visit: Payer: Self-pay | Admitting: Cardiovascular Disease

## 2019-09-27 ENCOUNTER — Other Ambulatory Visit: Payer: Self-pay

## 2019-09-27 ENCOUNTER — Ambulatory Visit (INDEPENDENT_AMBULATORY_CARE_PROVIDER_SITE_OTHER): Payer: Medicare Other

## 2019-09-27 DIAGNOSIS — I739 Peripheral vascular disease, unspecified: Secondary | ICD-10-CM

## 2019-09-28 ENCOUNTER — Other Ambulatory Visit: Payer: Self-pay

## 2019-09-28 DIAGNOSIS — I739 Peripheral vascular disease, unspecified: Secondary | ICD-10-CM

## 2019-10-04 ENCOUNTER — Encounter: Payer: Self-pay | Admitting: Cardiovascular Disease

## 2019-10-04 ENCOUNTER — Ambulatory Visit (INDEPENDENT_AMBULATORY_CARE_PROVIDER_SITE_OTHER): Payer: Medicare Other | Admitting: Cardiovascular Disease

## 2019-10-04 VITALS — BP 136/80 | HR 80 | Ht 68.0 in | Wt 217.0 lb

## 2019-10-04 DIAGNOSIS — I2510 Atherosclerotic heart disease of native coronary artery without angina pectoris: Secondary | ICD-10-CM

## 2019-10-04 DIAGNOSIS — I5022 Chronic systolic (congestive) heart failure: Secondary | ICD-10-CM | POA: Diagnosis not present

## 2019-10-04 DIAGNOSIS — I10 Essential (primary) hypertension: Secondary | ICD-10-CM | POA: Diagnosis not present

## 2019-10-04 DIAGNOSIS — E785 Hyperlipidemia, unspecified: Secondary | ICD-10-CM

## 2019-10-04 DIAGNOSIS — Z86718 Personal history of other venous thrombosis and embolism: Secondary | ICD-10-CM | POA: Diagnosis not present

## 2019-10-04 MED ORDER — METOPROLOL SUCCINATE ER 200 MG PO TB24
200.0000 mg | ORAL_TABLET | Freq: Every day | ORAL | 2 refills | Status: DC
Start: 2019-10-04 — End: 2020-02-29

## 2019-10-04 NOTE — Patient Instructions (Signed)
Medication Instructions:  Your physician has recommended you make the following change in your medication:  1) STOP Metoprolol Tartrate 2) START Metoprolol Succinate 200 mg daily. An Rx has been sent to [facility]  3) STOP Simvastatin 4) START Rosuvastatin 20 mg daily  *If you need a refill on your cardiac medications before your next appointment, please call your pharmacy*   Lab Work: None ordered If you have labs (blood work) drawn today and your tests are completely normal, you will receive your results only by:  [place] (if you have MyChart) OR  A paper copy in the mail If you have any lab test that is abnormal or we need to change your treatment, we will call you to review the results.   Testing/Procedures: None ordered   Follow-Up: At [facility], you and your health needs are our priority.  As part of our continuing mission to provide you with exceptional heart care, we have created designated Provider Care Teams.  These Care Teams include your primary Cardiologist (physician) and Advanced Practice Providers (APPs -  Physician Assistants and Nurse Practitioners) who all work together to provide you with the care you need, when you need it.  We recommend signing up for the patient portal called "MyChart".  Sign up information is provided on this After Visit Summary.  MyChart is used to connect with patients for Virtual Visits (Telemedicine).  Patients are able to view lab/test results, encounter notes, upcoming appointments, etc.  Non-urgent messages can be sent to your provider as well.   To learn more about what you can do with MyChart, go to [email or web address].    Your next appointment:   6 month(s)  The format for your next appointment:   In Person  Provider:    You may see Kathlyn Sacramento, MD or one of the following Advanced Practice Providers on your designated Care Team:    Murray Hodgkins, NP  Christell Faith, PA-C  Marrianne Mood,  PA-C    Other Instructions N/A

## 2019-10-04 NOTE — Progress Notes (Signed)
Cardiology Office Note   Date:  10/04/2019   ID:  [name], DOB November 29, 1955, MRN [id number]  PCP:  Glenda Chroman, MD  Cardiologist:   Kathlyn Sacramento, MD   Chief Complaint  Patient presents with  . OTher    2 month follow up. Meds reviewed verbally with patient.       History of Present Illness: Glenn Hawkins is a 64 y.o. male who presents for a followup visit. He has known history of coronary artery disease status post myocardial infarction with late presentation. He is being treated medically and did not have cardiac catheterization done due to chronic kidney disease (creatinine around 2.5) and late presentation. He had a nuclear stress test in January of 2013 which showed mostly a large transmural infarct in the left circumflex distribution without significant ischemia. EF was 40-45% with moderate mitral regurgitation.  He had extensive right leg DVT in August 2016 and has been on anticoagulation since then. He also has mild bilateral calf claudication due to peripheral arterial disease and was seen by Dr. Oneida Alar for this.  He had a follow-up echocardiogram last month which showed an EF of 30 to 35% with akinesis of the inferolateral wall, mild mitral regurgitation and mild to moderate aortic regurgitation. He complained of some worsening of bilateral leg pain. Noninvasive vascular studies were repeated and showed stable ABI in the 0.85 range. Duplex showed no significant infrainguinal disease.  Reports stable exertional dyspnea with no orthopnea PND or leg edema. He has mild bilateral calf claudication. No chest pain.  Past Medical History:  Diagnosis Date  . CKD (chronic kidney disease) stage 3, GFR 30-59 ml/min    baseline Cr around 2.5  . Claudication ([place])    a. 08/2015 ABI/duplex: R: 0.86, L 0.96. Duplex w/ bilateral heterogeneous plaque in mid fem arteries, no significant stenoses.  . Coronary atherosclerosis of native coronary artery    a. 02/2011 Late presentation  MI-->Myoview w/ large area of transmural infarct in LCX distribution, no ischemia.  . Depressive disorder, not elsewhere classified   . Esophageal reflux   . Gout, unspecified   . Hemorrhoids    a. 01/2016 s/p hemorrhoidectomy.  [name] History of DVT (deep vein thrombosis)    a. Chronic Eliquis.  . Hypertension   . Hypotension, unspecified   . Ischemic cardiomyopathy    a. 04/2012 Echo: EF 40%, inflat AK, Gr1 DD, mild AI/MR, triv TR.  . 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 04/2012 Echo: EF 40%, mild MR.  . Myocardial infarction (lateral wall) ([place]) 2013   a. 02/2011 - late presentation. Managed conservatively 2/2 CKD;  b. 02/2011 Myoview:  Large transmural infarct in the LCX distribution, no ischemia.  . Other and unspecified hyperlipidemia   . Personal history of tobacco use, presenting hazards to health   . Torn ligament   . Unspecified schizophrenia, unspecified condition     Past Surgical History:  Procedure Laterality Date  . COLONOSCOPY    . COLONOSCOPY N/A 11/10/2014   Procedure: COLONOSCOPY;  Surgeon: Manus Gunning, MD;  Location: [facility] ENDOSCOPY;  Service: Gastroenterology;  Laterality: N/A;  . COLONOSCOPY WITH PROPOFOL N/A 05/08/2015   Procedure: COLONOSCOPY WITH PROPOFOL;  Surgeon: [name], MD;  [facility];  Service: Endoscopy;  Laterality: N/A;  1215 - office states unable to move patient  . ESOPHAGOGASTRODUODENOSCOPY (EGD) WITH PROPOFOL N/A 05/08/2015   Procedure: ESOPHAGOGASTRODUODENOSCOPY (EGD) WITH PROPOFOL;  Surgeon: [name], MD;  [facility]  [facility];  Service: Endoscopy;  Laterality: N/A;  . EYE SURGERY Right    removal of foreign body  . HEMORRHOID SURGERY N/A 02/20/2016   Procedure: EXTENSIVE HEMORRHOIDECTOMY;  Surgeon: [name], MD;  Location: [facility] ORS;  Service: General;  Laterality: N/A;  . None    . POLYPECTOMY  05/08/2015   Procedure: POLYPECTOMY;  Surgeon: [name], MD;  Location: [facility];  Service: Endoscopy;;   transverse colon polyp     Current Outpatient Medications  Medication Sig Dispense Refill  . amLODipine (NORVASC) 10 MG tablet Take 10 mg by mouth daily.    [name] apixaban (ELIQUIS) 5 MG TABS tablet Take 1 tablet (5 mg total) by mouth 2 (two) times daily. 180 tablet 3  . ARIPiprazole (ABILIFY) 20 MG tablet Take 20 mg by mouth 2 (two) times daily.    [name] buPROPion (WELLBUTRIN) 100 MG tablet Take 200 mg by mouth 2 (two) times daily. Takes 2 tablets at bedtime    . cholecalciferol (VITAMIN D) 25 MCG (1000 UNIT) tablet Take 1 tablets every other day    . COLCRYS 0.6 MG tablet Take 0.6 mg by mouth 2 (two) times daily.     [name] esomeprazole (NEXIUM) 20 MG capsule Take 20 mg by mouth daily as needed (gurd).     . hydrALAZINE (APRESOLINE) 100 MG tablet Take 100 mg by mouth 3 (three) times daily.     . isosorbide mononitrate (IMDUR) 60 MG 24 hr tablet TAKE 1 TABLET EVERY MORNING 90 tablet 3  . LORazepam (ATIVAN) 2 MG tablet Take 2 mg by mouth at bedtime.     . metoprolol tartrate (LOPRESSOR) 100 MG tablet Take 100 mg by mouth 2 (two) times daily.    . nitroGLYCERIN (NITROSTAT) 0.4 MG SL tablet Place 1 tablet (0.4 mg total) under the tongue every 5 (five) minutes as needed for chest pain. 25 tablet 3  . QUEtiapine (SEROQUEL) 400 MG tablet Take 400 mg by mouth at bedtime.     . rosuvastatin (CRESTOR) 20 MG tablet Take 1 tablet (20 mg total) by mouth daily. 90 tablet 2  . sertraline (ZOLOFT) 100 MG tablet Take 200 mg by mouth daily.     . sildenafil (VIAGRA) 50 MG tablet Take 50 mg by mouth daily as needed for erectile dysfunction.    [name] spironolactone (ALDACTONE) 25 MG tablet Take 50 mg by mouth daily. Takes 2 tablets at bedtime     No current facility-administered medications for this visit.    Allergies:   Nifedipine, Rabeprazole, Benazepril, Haloperidol, Haloperidol and related, and Omeprazole    Social History:  The patient  reports that he quit smoking about 23 years ago. His smoking use included  cigarettes. He has a 23.00 pack-year smoking history. He has never used smokeless tobacco. He reports that he does not drink alcohol and does not use drugs.   Family History:  The patient's family history is not on file.    ROS:  Please see the history of present illness.   Otherwise, review of systems are positive for none.   All other systems are reviewed and negative.    PHYSICAL EXAM: VS:  BP 136/80 (BP Location: Left Arm, Patient Position: Sitting, Cuff Size: Normal)   Pulse 80   Ht 5\' 8"  (1.727 m)   Wt 217 lb (98.4 kg)   SpO2 97%   BMI 32.99 kg/m  , BMI Body mass index is 32.99 kg/m. GEN: Well nourished, well developed, in no  acute distress  HEENT: normal  Neck: no JVD, carotid bruits, or masses Cardiac: RRR; no murmurs, rubs, or gallops,no edema  Respiratory:  clear to auscultation bilaterally, normal work of breathing GI: soft, nontender, nondistended, + BS MS: no deformity or atrophy  Skin: warm and dry, no rash Neuro:  Strength and sensation are intact Psych: euthymic mood, full affect   EKG:  EKG is ordered today. The ekg ordered today demonstrates normal sinus rhythm with LVH and lateral T wave changes suggestive of ischemia.   Recent Labs: 08/02/2019: ALT 18; BUN 30; Creatinine, Ser 2.93; Hemoglobin 13.5; Platelets 145; Potassium 4.4; Sodium 141    Lipid Panel    Component Value Date/Time   CHOL 191 08/02/2019 1515   TRIG 175 (H) 08/02/2019 1515   HDL 37 (L) 08/02/2019 1515   CHOLHDL 5.2 (H) 08/02/2019 1515   CHOLHDL 4.2 11/09/2014 0525   VLDL 23 11/09/2014 0525   LDLCALC 123 (H) 08/02/2019 1515   LDLDIRECT 125 (H) 08/02/2019 1515      Wt Readings from Last 3 Encounters:  10/04/19 217 lb (98.4 kg)  08/02/19 217 lb (98.4 kg)  02/02/18 210 lb 8 oz (95.5 kg)       ASSESSMENT AND PLAN:  1.  Coronary artery disease involving native coronary arteries without angina: He continues to be clinically stable with no new symptoms.  Continue medical  therapy.  2. Chronic systolic heart failure: Recent echo showed slight decrease in ejection fraction to 30 to 35%. Previously was 35 to 40%. In spite of that, there is no significant change in his symptoms and functional capacity. I elected to switch metoprolol tartrate to metoprolol succinate 100 mg once daily given his cardiomyopathy. He is not on ACE inhibitor or ARB due to advanced chronic kidney disease.   Most recent GFR was 25. Even spironolactone might need to be stopped but given stability of renal function will continue for now.   3. Essential hypertension: Blood pressure is reasonably controlled on current medications.  4. History of right leg DVT: Currently on anticoagulation with Eliquis.  5. Hyperlipidemia: Recent labs showed an LDL of 123. I agree with switching simvastatin to rosuvastatin.  6. Peripheral arterial disease: The patient complains of mild bilateral leg claudication. Recent ABI was stable. He does not seem to have significant infrainguinal disease. Femoral pulses mildly reduced bilaterally suggestive of possible inflow disease. Given that his symptoms are not lifestyle limiting and he has advanced chronic kidney disease,, will continue with medical therapy for now.   Disposition:   FU with me in 6 months  Signed,  Kathlyn Sacramento, MD  10/04/2019 2:10 PM    [place]

## 2019-10-14 DIAGNOSIS — E7800 Pure hypercholesterolemia, unspecified: Secondary | ICD-10-CM | POA: Diagnosis not present

## 2019-10-14 DIAGNOSIS — I10 Essential (primary) hypertension: Secondary | ICD-10-CM | POA: Diagnosis not present

## 2019-10-28 ENCOUNTER — Other Ambulatory Visit (HOSPITAL_COMMUNITY): Payer: Self-pay | Admitting: Orthopedic Surgery

## 2019-10-28 ENCOUNTER — Other Ambulatory Visit: Payer: Self-pay | Admitting: Orthopedic Surgery

## 2019-10-28 DIAGNOSIS — M25561 Pain in right knee: Secondary | ICD-10-CM

## 2019-11-16 ENCOUNTER — Encounter (HOSPITAL_COMMUNITY): Payer: Self-pay

## 2019-11-16 ENCOUNTER — Ambulatory Visit (HOSPITAL_COMMUNITY): Payer: No Typology Code available for payment source

## 2019-11-24 DIAGNOSIS — E7800 Pure hypercholesterolemia, unspecified: Secondary | ICD-10-CM | POA: Diagnosis not present

## 2019-11-24 DIAGNOSIS — I10 Essential (primary) hypertension: Secondary | ICD-10-CM | POA: Diagnosis not present

## 2019-12-01 ENCOUNTER — Ambulatory Visit (HOSPITAL_COMMUNITY)
Admission: RE | Admit: 2019-12-01 | Discharge: 2019-12-01 | Disposition: A | Discharge status: Home / Self Care | Payer: No Typology Code available for payment source | Source: Ambulatory Visit | Attending: Orthopedic Surgery | Admitting: Orthopedic Surgery

## 2019-12-01 ENCOUNTER — Other Ambulatory Visit: Payer: Self-pay

## 2019-12-01 DIAGNOSIS — M25561 Pain in right knee: Secondary | ICD-10-CM | POA: Insufficient documentation

## 2019-12-23 DIAGNOSIS — E7800 Pure hypercholesterolemia, unspecified: Secondary | ICD-10-CM | POA: Diagnosis not present

## 2019-12-23 DIAGNOSIS — I10 Essential (primary) hypertension: Secondary | ICD-10-CM | POA: Diagnosis not present

## 2020-01-12 DIAGNOSIS — N189 Chronic kidney disease, unspecified: Secondary | ICD-10-CM | POA: Diagnosis not present

## 2020-01-12 DIAGNOSIS — F200 Paranoid schizophrenia: Secondary | ICD-10-CM | POA: Diagnosis not present

## 2020-01-12 DIAGNOSIS — Z1211 Encounter for screening for malignant neoplasm of colon: Secondary | ICD-10-CM | POA: Diagnosis not present

## 2020-01-12 DIAGNOSIS — Z299 Encounter for prophylactic measures, unspecified: Secondary | ICD-10-CM | POA: Diagnosis not present

## 2020-01-12 DIAGNOSIS — E7800 Pure hypercholesterolemia, unspecified: Secondary | ICD-10-CM | POA: Diagnosis not present

## 2020-01-12 DIAGNOSIS — R5383 Other fatigue: Secondary | ICD-10-CM | POA: Diagnosis not present

## 2020-01-12 DIAGNOSIS — E559 Vitamin D deficiency, unspecified: Secondary | ICD-10-CM | POA: Diagnosis not present

## 2020-01-12 DIAGNOSIS — I10 Essential (primary) hypertension: Secondary | ICD-10-CM | POA: Diagnosis not present

## 2020-01-18 ENCOUNTER — Other Ambulatory Visit (HOSPITAL_COMMUNITY): Payer: Self-pay | Admitting: Nephrology

## 2020-01-18 ENCOUNTER — Other Ambulatory Visit: Payer: Self-pay | Admitting: Nephrology

## 2020-01-18 DIAGNOSIS — I129 Hypertensive chronic kidney disease with stage 1 through stage 4 chronic kidney disease, or unspecified chronic kidney disease: Secondary | ICD-10-CM | POA: Diagnosis not present

## 2020-01-18 DIAGNOSIS — R809 Proteinuria, unspecified: Secondary | ICD-10-CM

## 2020-01-18 DIAGNOSIS — N170 Acute kidney failure with tubular necrosis: Secondary | ICD-10-CM

## 2020-01-18 DIAGNOSIS — N184 Chronic kidney disease, stage 4 (severe): Secondary | ICD-10-CM

## 2020-01-18 DIAGNOSIS — D696 Thrombocytopenia, unspecified: Secondary | ICD-10-CM | POA: Diagnosis not present

## 2020-01-18 DIAGNOSIS — D638 Anemia in other chronic diseases classified elsewhere: Secondary | ICD-10-CM | POA: Diagnosis not present

## 2020-01-18 DIAGNOSIS — Z79899 Other long term (current) drug therapy: Secondary | ICD-10-CM | POA: Diagnosis not present

## 2020-01-18 DIAGNOSIS — I10 Essential (primary) hypertension: Secondary | ICD-10-CM

## 2020-01-18 DIAGNOSIS — E872 Acidosis: Secondary | ICD-10-CM | POA: Diagnosis not present

## 2020-01-18 DIAGNOSIS — I5042 Chronic combined systolic (congestive) and diastolic (congestive) heart failure: Secondary | ICD-10-CM | POA: Diagnosis not present

## 2020-01-23 DIAGNOSIS — N170 Acute kidney failure with tubular necrosis: Secondary | ICD-10-CM | POA: Diagnosis not present

## 2020-01-23 DIAGNOSIS — I129 Hypertensive chronic kidney disease with stage 1 through stage 4 chronic kidney disease, or unspecified chronic kidney disease: Secondary | ICD-10-CM | POA: Diagnosis not present

## 2020-01-23 DIAGNOSIS — N184 Chronic kidney disease, stage 4 (severe): Secondary | ICD-10-CM | POA: Diagnosis not present

## 2020-01-23 DIAGNOSIS — R809 Proteinuria, unspecified: Secondary | ICD-10-CM | POA: Diagnosis not present

## 2020-01-23 DIAGNOSIS — Z79899 Other long term (current) drug therapy: Secondary | ICD-10-CM | POA: Diagnosis not present

## 2020-01-23 DIAGNOSIS — E872 Acidosis: Secondary | ICD-10-CM | POA: Diagnosis not present

## 2020-01-23 DIAGNOSIS — D638 Anemia in other chronic diseases classified elsewhere: Secondary | ICD-10-CM | POA: Diagnosis not present

## 2020-01-23 DIAGNOSIS — D696 Thrombocytopenia, unspecified: Secondary | ICD-10-CM | POA: Diagnosis not present

## 2020-01-23 DIAGNOSIS — I5042 Chronic combined systolic (congestive) and diastolic (congestive) heart failure: Secondary | ICD-10-CM | POA: Diagnosis not present

## 2020-01-24 DIAGNOSIS — I10 Essential (primary) hypertension: Secondary | ICD-10-CM | POA: Diagnosis not present

## 2020-01-24 DIAGNOSIS — E7800 Pure hypercholesterolemia, unspecified: Secondary | ICD-10-CM | POA: Diagnosis not present

## 2020-01-30 ENCOUNTER — Ambulatory Visit (HOSPITAL_COMMUNITY)
Admission: RE | Admit: 2020-01-30 | Discharge: 2020-01-30 | Disposition: A | Discharge status: Home / Self Care | Payer: Medicare Other | Source: Ambulatory Visit | Attending: Nephrology | Admitting: Nephrology

## 2020-01-30 ENCOUNTER — Other Ambulatory Visit: Payer: Self-pay

## 2020-01-30 DIAGNOSIS — I10 Essential (primary) hypertension: Secondary | ICD-10-CM | POA: Insufficient documentation

## 2020-01-30 DIAGNOSIS — N184 Chronic kidney disease, stage 4 (severe): Secondary | ICD-10-CM | POA: Diagnosis not present

## 2020-01-30 DIAGNOSIS — N170 Acute kidney failure with tubular necrosis: Secondary | ICD-10-CM | POA: Diagnosis not present

## 2020-01-30 DIAGNOSIS — N189 Chronic kidney disease, unspecified: Secondary | ICD-10-CM | POA: Diagnosis not present

## 2020-01-30 DIAGNOSIS — R809 Proteinuria, unspecified: Secondary | ICD-10-CM | POA: Diagnosis not present

## 2020-01-31 ENCOUNTER — Other Ambulatory Visit: Payer: Self-pay

## 2020-01-31 NOTE — Telephone Encounter (Signed)
*  STAT* If patient is at the pharmacy, call can be transferred to refill team.   1. Which medications need to be refilled? (please list name of each medication and dose if known) Isosorbide  2. Which pharmacy/location (including street and city if local pharmacy) is medication to be sent to? [place]  3. Do they need a 30 day or 90 day supply? [place]

## 2020-01-31 NOTE — Telephone Encounter (Signed)
Called patient to verify the need for refill on Isosorbide since it was just refilled in 09/2019 #90 and 3 RF. Per patient he found his medication at home and apologized for calling. Explained to patient, it was ok and call back if he needs anything from [place].

## 2020-02-01 DIAGNOSIS — I5042 Chronic combined systolic (congestive) and diastolic (congestive) heart failure: Secondary | ICD-10-CM | POA: Diagnosis not present

## 2020-02-01 DIAGNOSIS — E872 Acidosis: Secondary | ICD-10-CM | POA: Diagnosis not present

## 2020-02-01 DIAGNOSIS — D696 Thrombocytopenia, unspecified: Secondary | ICD-10-CM | POA: Diagnosis not present

## 2020-02-01 DIAGNOSIS — N184 Chronic kidney disease, stage 4 (severe): Secondary | ICD-10-CM | POA: Diagnosis not present

## 2020-02-01 DIAGNOSIS — I129 Hypertensive chronic kidney disease with stage 1 through stage 4 chronic kidney disease, or unspecified chronic kidney disease: Secondary | ICD-10-CM | POA: Diagnosis not present

## 2020-02-01 DIAGNOSIS — R809 Proteinuria, unspecified: Secondary | ICD-10-CM | POA: Diagnosis not present

## 2020-02-01 DIAGNOSIS — N281 Cyst of kidney, acquired: Secondary | ICD-10-CM | POA: Diagnosis not present

## 2020-02-02 ENCOUNTER — Ambulatory Visit (HOSPITAL_COMMUNITY): Payer: No Typology Code available for payment source

## 2020-02-02 DIAGNOSIS — I10 Essential (primary) hypertension: Secondary | ICD-10-CM | POA: Diagnosis not present

## 2020-02-02 DIAGNOSIS — I5022 Chronic systolic (congestive) heart failure: Secondary | ICD-10-CM | POA: Diagnosis not present

## 2020-02-02 DIAGNOSIS — Z1331 Encounter for screening for depression: Secondary | ICD-10-CM | POA: Diagnosis not present

## 2020-02-02 DIAGNOSIS — Z7189 Other specified counseling: Secondary | ICD-10-CM | POA: Diagnosis not present

## 2020-02-02 DIAGNOSIS — Z0000 Encounter for general adult medical examination without abnormal findings: Secondary | ICD-10-CM | POA: Diagnosis not present

## 2020-02-02 DIAGNOSIS — Z6830 Body mass index (BMI) 30.0-30.9, adult: Secondary | ICD-10-CM | POA: Diagnosis not present

## 2020-02-02 DIAGNOSIS — Z125 Encounter for screening for malignant neoplasm of prostate: Secondary | ICD-10-CM | POA: Diagnosis not present

## 2020-02-02 DIAGNOSIS — Z6829 Body mass index (BMI) 29.0-29.9, adult: Secondary | ICD-10-CM | POA: Diagnosis not present

## 2020-02-02 DIAGNOSIS — Z79899 Other long term (current) drug therapy: Secondary | ICD-10-CM | POA: Diagnosis not present

## 2020-02-02 DIAGNOSIS — R5383 Other fatigue: Secondary | ICD-10-CM | POA: Diagnosis not present

## 2020-02-02 DIAGNOSIS — E7800 Pure hypercholesterolemia, unspecified: Secondary | ICD-10-CM | POA: Diagnosis not present

## 2020-02-02 DIAGNOSIS — Z299 Encounter for prophylactic measures, unspecified: Secondary | ICD-10-CM | POA: Diagnosis not present

## 2020-02-02 DIAGNOSIS — Z1339 Encounter for screening examination for other mental health and behavioral disorders: Secondary | ICD-10-CM | POA: Diagnosis not present

## 2020-02-15 DIAGNOSIS — I129 Hypertensive chronic kidney disease with stage 1 through stage 4 chronic kidney disease, or unspecified chronic kidney disease: Secondary | ICD-10-CM | POA: Diagnosis not present

## 2020-02-15 DIAGNOSIS — R809 Proteinuria, unspecified: Secondary | ICD-10-CM | POA: Diagnosis not present

## 2020-02-15 DIAGNOSIS — N184 Chronic kidney disease, stage 4 (severe): Secondary | ICD-10-CM | POA: Diagnosis not present

## 2020-02-15 DIAGNOSIS — I5042 Chronic combined systolic (congestive) and diastolic (congestive) heart failure: Secondary | ICD-10-CM | POA: Diagnosis not present

## 2020-02-15 DIAGNOSIS — E872 Acidosis: Secondary | ICD-10-CM | POA: Diagnosis not present

## 2020-02-20 DIAGNOSIS — Z6829 Body mass index (BMI) 29.0-29.9, adult: Secondary | ICD-10-CM | POA: Diagnosis not present

## 2020-02-20 DIAGNOSIS — I10 Essential (primary) hypertension: Secondary | ICD-10-CM | POA: Diagnosis not present

## 2020-02-20 DIAGNOSIS — Z299 Encounter for prophylactic measures, unspecified: Secondary | ICD-10-CM | POA: Diagnosis not present

## 2020-02-20 DIAGNOSIS — Z713 Dietary counseling and surveillance: Secondary | ICD-10-CM | POA: Diagnosis not present

## 2020-02-20 DIAGNOSIS — H6123 Impacted cerumen, bilateral: Secondary | ICD-10-CM | POA: Diagnosis not present

## 2020-02-23 DIAGNOSIS — E7800 Pure hypercholesterolemia, unspecified: Secondary | ICD-10-CM | POA: Diagnosis not present

## 2020-02-23 DIAGNOSIS — I10 Essential (primary) hypertension: Secondary | ICD-10-CM | POA: Diagnosis not present

## 2020-02-27 ENCOUNTER — Ambulatory Visit: Payer: No Typology Code available for payment source | Admitting: Physician Assistant

## 2020-02-28 DIAGNOSIS — R809 Proteinuria, unspecified: Secondary | ICD-10-CM | POA: Diagnosis not present

## 2020-02-28 DIAGNOSIS — N184 Chronic kidney disease, stage 4 (severe): Secondary | ICD-10-CM | POA: Diagnosis not present

## 2020-02-28 DIAGNOSIS — I129 Hypertensive chronic kidney disease with stage 1 through stage 4 chronic kidney disease, or unspecified chronic kidney disease: Secondary | ICD-10-CM | POA: Diagnosis not present

## 2020-02-28 DIAGNOSIS — I5042 Chronic combined systolic (congestive) and diastolic (congestive) heart failure: Secondary | ICD-10-CM | POA: Diagnosis not present

## 2020-02-28 DIAGNOSIS — E872 Acidosis: Secondary | ICD-10-CM | POA: Diagnosis not present

## 2020-02-28 DIAGNOSIS — D696 Thrombocytopenia, unspecified: Secondary | ICD-10-CM | POA: Diagnosis not present

## 2020-02-29 ENCOUNTER — Other Ambulatory Visit: Payer: Self-pay

## 2020-02-29 ENCOUNTER — Ambulatory Visit (INDEPENDENT_AMBULATORY_CARE_PROVIDER_SITE_OTHER): Payer: Medicare Other | Admitting: Nurse Practitioner

## 2020-02-29 ENCOUNTER — Encounter: Payer: Self-pay | Admitting: Nurse Practitioner

## 2020-02-29 VITALS — BP 140/86 | HR 67 | Ht 68.0 in | Wt 201.0 lb

## 2020-02-29 DIAGNOSIS — I10 Essential (primary) hypertension: Secondary | ICD-10-CM

## 2020-02-29 DIAGNOSIS — I5020 Unspecified systolic (congestive) heart failure: Secondary | ICD-10-CM

## 2020-02-29 DIAGNOSIS — I2510 Atherosclerotic heart disease of native coronary artery without angina pectoris: Secondary | ICD-10-CM | POA: Diagnosis not present

## 2020-02-29 DIAGNOSIS — E785 Hyperlipidemia, unspecified: Secondary | ICD-10-CM | POA: Diagnosis not present

## 2020-02-29 DIAGNOSIS — N184 Chronic kidney disease, stage 4 (severe): Secondary | ICD-10-CM

## 2020-02-29 MED ORDER — METOPROLOL SUCCINATE ER 200 MG PO TB24: 200.0000 mg | ORAL_TABLET | Freq: Every day | ORAL | 3 refills | Status: DC

## 2020-02-29 NOTE — Progress Notes (Signed)
Office Visit    Patient Name: Glenn Hawkins Date of Encounter: 02/29/2020  Primary Care Provider:  Glenda Chroman, MD Primary Cardiologist:  Kathlyn Sacramento, MD  Chief Complaint    65 year old male with a history of CAD status post late presenting MI in 2013, ischemic cardiomyopathy, HFrEF (EF 30-35% July 2021), hypertension, hyperlipidemia, stage III-IV chronic kidney disease, history of DVT on chronic Eliquis, and peripheral arterial disease with claudication, who presents for follow-up related to CHF.  Past Medical History    Past Medical History:  Diagnosis Date  . Aortic insufficiency    a. 08/2019 Echo: mild to mod AI.  [name] CKD (chronic kidney disease) stage 3, GFR 30-59 ml/min (HCC)    baseline Cr around 2.5  . Coronary atherosclerosis of native coronary artery    a. 02/2011 Late presentation MI-->Myoview w/ large area of transmural infarct in LCX distribution, no ischemia.  . Depressive disorder, not elsewhere classified   . Esophageal reflux   . Gout, unspecified   . Hemorrhoids    a. 01/2016 s/p hemorrhoidectomy.  . HFrEF (heart failure with reduced ejection fraction) ([facility])    a. 04/2012 Echo: EF 40%, inflat AK, Gr1 DD, mild AI/MR, triv TR; b. 11/2017 EchoP EF 30-35%, inflat HK; c. 08/2019 Echo: EF 30-35%, gr1 DD, inflat AK.  [name] History of DVT (deep vein thrombosis)    a. Chronic Eliquis.  . Hyperlipidemia LDL goal <70   . Hypertension   . Hypotension, unspecified   . Ischemic cardiomyopathy    a. 04/2012 Echo: EF 40%, inflat AK, Gr1 DD, mild AI/MR, triv TR; b. 11/2017 EchoP EF 30-35%, inflat HK; c. 08/2019 Echo: EF 30-35%, gr1 DD, inflat AK. Nl RV size/fxn. Mild MR. Mild to mod AI.  [name] Mitral valve disorders(424.0)    a. 04/2012 Echo: EF 40%, mild MR.  . Myocardial infarction (lateral wall) ([place]) 2013   a. 02/2011 - late presentation. Managed conservatively 2/2 CKD;  b. 02/2011 Myoview: Large transmural infarct in the LCX distribution, no ischemia.  [name] PAD (peripheral artery  disease) ([place])    a. 08/2015 ABI/duplex: R: 0.86, L 0.96. Duplex w/ bilateral heterogeneous plaque in mid fem arteries, no significant stenoses; b. 08/2019 ABI/Duplex: prob bilat inflow dzs w/ stable ABIs (R 0.85, L 0.93).  . Personal history of tobacco use, presenting hazards to health   . Torn ligament   . Unspecified schizophrenia, unspecified condition    Past Surgical History:  Procedure Laterality Date  . COLONOSCOPY    . COLONOSCOPY N/A 11/10/2014   Procedure: COLONOSCOPY;  Surgeon: Manus Gunning, MD;  Location: [facility] ENDOSCOPY;  Service: Gastroenterology;  Laterality: N/A;  . COLONOSCOPY WITH PROPOFOL N/A 05/08/2015   Procedure: COLONOSCOPY WITH PROPOFOL;  Surgeon: [name], MD;  Location: [facility] ENDO SUITE;  Service: Endoscopy;  Laterality: N/A;  1215 - office states unable to move patient  . ESOPHAGOGASTRODUODENOSCOPY (EGD) WITH PROPOFOL N/A 05/08/2015   Procedure: ESOPHAGOGASTRODUODENOSCOPY (EGD) WITH PROPOFOL;  Surgeon: [name], MD;  Location: [facility] ENDO SUITE;  Service: Endoscopy;  Laterality: N/A;  . EYE SURGERY Right    removal of foreign body  . HEMORRHOID SURGERY N/A 02/20/2016   Procedure: EXTENSIVE HEMORRHOIDECTOMY;  Surgeon: [name], MD;  Location: [facility] ORS;  Service: General;  Laterality: N/A;  . None    . POLYPECTOMY  05/08/2015   Procedure: POLYPECTOMY;  Surgeon: [name], MD;  Location: [facility] ENDO SUITE;  Service: Endoscopy;;  transverse colon polyp  Allergies  Allergies  Allergen Reactions  . Nifedipine Diarrhea  . Rabeprazole Other (See Comments)    unknown  . Benazepril Other (See Comments)    unknown  . Haloperidol Anxiety    Causes severe anxiety  . Haloperidol And Related Other (See Comments)    anxiety  . Omeprazole Other (See Comments)    unknown    History of Present Illness    65 year old male with above past medical history including CAD status post late presenting MI in 2013, ischemic cardiomyopathy, HFrEF, hypertension,  hyperlipidemia, stage III chronic kidney disease, history of DVT on chronic Eliquis, and peripheral arterial disease with claudication and stable ABIs.  As noted, he suffered a late presenting myocardial infarction in early 2013.  Catheterization was not done secondary to chronic kidney disease with a baseline creatinine of approximately 2.5 at that time.  Stress testing in January 2013 showed a large transmural infarct in the left circumflex distribution without significant ischemia.  EF was 40 to 45% with moderate mitral regurgitation at the time.  Subsequent echocardiograms have continued to show LV dysfunction with inferolateral akinesis.  His most recent echo in July 2021 showed an EF of 30-35% with grade 1 diastolic dysfunction, mild MR, and mild to moderate AI.  At office visit in June 2021, he reported some worsening of bilateral leg pain and claudication.  ABIs were performed and were stable at 0.85 on the right and 0.93 on the left.  Lower extremity arterial duplex showed no significant infrainguinal disease.  At his last office follow-up in August 2021, he reported stable dyspnea on exertion and mild calf claudication.  Metoprolol therapy was consolidated to Toprol-XL in the setting of ongoing cardiomyopathy.  Despite this being documented in prescription being sent, patient says he has not received anything from [place] and has been having his short acting metoprolol filled through the [place].  He has continued to take 100 mg twice daily.  From a heart failure standpoint, he has been stable.  He notes chronic, stable dyspnea on exertion but denies any significant change in weight, PND, orthopnea, dizziness, syncope, edema, or early satiety.  He has not been having any chest pain.  Home Medications    Prior to Admission medications   Medication Sig Start Date End Date Taking? Authorizing Provider  amLODipine (NORVASC) 10 MG tablet Take 10 mg by mouth daily.    [provider]   apixaban (ELIQUIS) 5 MG TABS tablet Take 1 tablet (5 mg total) by mouth 2 (two) times daily. 04/30/16   Wellington Hampshire, MD  ARIPiprazole (ABILIFY) 20 MG tablet Take 20 mg by mouth 2 (two) times daily.    [provider]  buPROPion (WELLBUTRIN) 100 MG tablet Take 200 mg by mouth 2 (two) times daily. Takes 2 tablets at bedtime    [provider]  cholecalciferol (VITAMIN D) 25 MCG (1000 UNIT) tablet Take 1 tablets every other day    [provider]  COLCRYS 0.6 MG tablet Take 0.6 mg by mouth 2 (two) times daily.  05/22/15   [provider]  esomeprazole (NEXIUM) 20 MG capsule Take 20 mg by mouth daily as needed (gurd).     [provider]  isosorbide mononitrate (IMDUR) 60 MG 24 hr tablet TAKE 1 TABLET EVERY MORNING 09/27/19   Wellington Hampshire, MD  LORazepam (ATIVAN) 2 MG tablet Take 2 mg by mouth at bedtime.     [provider]  metoprolol (TOPROL XL) 200 MG 24  hr tablet Take 1 tablet (200 mg total) by mouth daily. 10/04/19   Wellington Hampshire, MD  nitroGLYCERIN (NITROSTAT) 0.4 MG SL tablet Place 1 tablet (0.4 mg total) under the tongue every 5 (five) minutes as needed for chest pain. 03/21/16   Wellington Hampshire, MD  QUEtiapine (SEROQUEL) 400 MG tablet Take 400 mg by mouth at bedtime.  10/06/17   [provider]  rosuvastatin (CRESTOR) 20 MG tablet Take 1 tablet (20 mg total) by mouth daily. 08/03/19 11/01/19  Loel Dubonnet, NP  sertraline (ZOLOFT) 100 MG tablet Take 200 mg by mouth daily.     [provider]  sildenafil (VIAGRA) 50 MG tablet Take 50 mg by mouth daily as needed for erectile dysfunction.    [provider]  spironolactone (ALDACTONE) 25 MG tablet Take 50 mg by mouth daily. Takes 2 tablets at bedtime    [provider]    Review of Systems    Chronic, stable dyspnea on exertion.  He denies chest pain, palpitations, pnd, orthopnea, n, v, dizziness, syncope, edema, weight gain, or early satiety.   All other systems reviewed and are otherwise negative except as noted above.  Physical Exam    VS:  BP 140/86 (BP Location: Left Arm, Patient Position: Sitting, Cuff Size: Normal)   Pulse 67   Ht 5\' 8"  (1.727 m)   Wt 201 lb (91.2 kg)   SpO2 97%   BMI 30.56 kg/m  , BMI Body mass index is 30.56 kg/m. GEN: Well nourished, well developed, in no acute distress. HEENT: normal. Neck: Supple, no JVD, carotid bruits, or masses. Cardiac: RRR, no murmurs, rubs, or gallops. No clubbing, cyanosis, edema.  Radials/PT 2+ and equal bilaterally.  Respiratory:  Respirations regular and unlabored, clear to auscultation bilaterally. GI: Soft, nontender, nondistended, BS + x 4. MS: no deformity or atrophy. Skin: warm and dry, no rash. Neuro:  Strength and sensation are intact. Psych: Flat affect.  Accessory Clinical Findings    ECG personally reviewed by me today -regular sinus rhythm, 67, leftward axis, lateral T wave inversion- no acute changes.  Lab Results  Component Value Date   WBC 8.0 08/02/2019   HGB 13.5 08/02/2019   HCT 41.8 08/02/2019   MCV 93 08/02/2019   PLT 145 (L) 08/02/2019   Labs from outside facility February 02, 2020: Total cholesterol 126, triglycerides 102, HDL 40, LDL 67 Sodium 142, potassium 4.5, chloride 113, CO2 15, BUN 41, creatinine 3.8, glucose 86 Calcium 8.9 Total protein 6.3, albumin 4.1, total bilirubin 0.3, alkaline phosphatase 103, AST 47, ALT 53  Lab Results  Component Value Date   HGBA1C 6.0 (H) 11/08/2014    Assessment & Plan    1.  Coronary artery disease: Status post late presenting myocardial infarction in 2013 with LV dysfunction ever since.  He has been stable without chest pain.  He remains on beta-blocker, nitrate, and statin therapy.  No aspirin in the setting of chronic Eliquis.  2.  Chronic heart failure with reduced ejection fraction: Echo in 2021 showed an EF of 30-35%.  Recommendation was made for switch from metoprolol tartrate to  succinate (200 mg daily) however, he remains on tartrate at 100 mg twice daily.  We discussed the rationale for the change in the setting of ongoing LV dysfunction and I printed out a prescription for him today to take to the [place], where he plans to go tomorrow for a routine appointment.  He is not on ACE/ARB/arni in the setting  of stage III-IV chronic kidney disease.  He is on spironolactone.  With a rise of creatinine to 3.8 on labs in December, unless his nephrologist feels otherwise, I would recommend discontinuation of spironolactone.  We briefly discussed the role of medical therapy and device therapy in patients with ongoing LV dysfunction.  He is not interested in device therapy at this time.  3.  Essential hypertension: Stable.  He informed today that he has not been taking hydralazine in some time and this has been removed from his list.  4.  Hyperlipidemia: LDL of 67 in December.  Remains on rosuvastatin therapy.  5.  Stage III-IV chronic kidney disease: Followed by nephrology at the [facility].  Labs in December showed elevation of creatinine to 3.8.  Potassium was stable at that time at 4.5.  6.  Peripheral arterial disease: No complaints of claudication today.  Stable ABIs last summer.  7.  Disposition: Follow-up in 4 months or sooner if necessary.   Murray Hodgkins, NP 02/29/2020, 5:16 PM

## 2020-02-29 NOTE — Patient Instructions (Signed)
Medication Instructions:   Your physician recommends that you continue on your current medications as directed. Please refer to the Current Medication list given to you today.  We have given you a written Rx as previously prescribed for Metoprolol Succinate 200mg  - take one tablet once daily  *If you need a refill on your cardiac medications before your next appointment, please call your pharmacy*   Lab Work: None ordered    Testing/Procedures: None ordered   Follow-Up: At [facility], you and your health needs are our priority.  As part of our continuing mission to provide you with exceptional heart care, we have created designated Provider Care Teams.  These Care Teams include your primary Cardiologist (physician) and Advanced Practice Providers (APPs -  Physician Assistants and Nurse Practitioners) who all work together to provide you with the care you need, when you need it.  We recommend signing up for the patient portal called "MyChart".  Sign up information is provided on this After Visit Summary.  MyChart is used to connect with patients for Virtual Visits (Telemedicine).  Patients are able to view lab/test results, encounter notes, upcoming appointments, etc.  Non-urgent messages can be sent to your provider as well.   To learn more about what you can do with MyChart, go to [email or web address].    Your next appointment:   4 month(s)  The format for your next appointment:   In Person  Provider:   You may see Kathlyn Sacramento, MD or one of the following Advanced Practice Providers on your designated Care Team:     Laurann Montana, NP

## 2020-03-14 DIAGNOSIS — I5042 Chronic combined systolic (congestive) and diastolic (congestive) heart failure: Secondary | ICD-10-CM | POA: Diagnosis not present

## 2020-03-14 DIAGNOSIS — N184 Chronic kidney disease, stage 4 (severe): Secondary | ICD-10-CM | POA: Diagnosis not present

## 2020-03-14 DIAGNOSIS — R809 Proteinuria, unspecified: Secondary | ICD-10-CM | POA: Diagnosis not present

## 2020-03-14 DIAGNOSIS — E872 Acidosis: Secondary | ICD-10-CM | POA: Diagnosis not present

## 2020-03-14 DIAGNOSIS — I129 Hypertensive chronic kidney disease with stage 1 through stage 4 chronic kidney disease, or unspecified chronic kidney disease: Secondary | ICD-10-CM | POA: Diagnosis not present

## 2020-03-14 DIAGNOSIS — D696 Thrombocytopenia, unspecified: Secondary | ICD-10-CM | POA: Diagnosis not present

## 2020-04-03 DIAGNOSIS — E872 Acidosis: Secondary | ICD-10-CM | POA: Diagnosis not present

## 2020-04-03 DIAGNOSIS — R809 Proteinuria, unspecified: Secondary | ICD-10-CM | POA: Diagnosis not present

## 2020-04-03 DIAGNOSIS — I129 Hypertensive chronic kidney disease with stage 1 through stage 4 chronic kidney disease, or unspecified chronic kidney disease: Secondary | ICD-10-CM | POA: Diagnosis not present

## 2020-04-03 DIAGNOSIS — N184 Chronic kidney disease, stage 4 (severe): Secondary | ICD-10-CM | POA: Diagnosis not present

## 2020-04-03 DIAGNOSIS — I5042 Chronic combined systolic (congestive) and diastolic (congestive) heart failure: Secondary | ICD-10-CM | POA: Diagnosis not present

## 2020-04-03 DIAGNOSIS — D696 Thrombocytopenia, unspecified: Secondary | ICD-10-CM | POA: Diagnosis not present

## 2020-04-16 ENCOUNTER — Encounter: Payer: Self-pay | Admitting: Internal Medicine

## 2020-04-17 ENCOUNTER — Ambulatory Visit: Payer: Medicare Other | Admitting: Cardiovascular Disease

## 2020-04-26 DIAGNOSIS — I129 Hypertensive chronic kidney disease with stage 1 through stage 4 chronic kidney disease, or unspecified chronic kidney disease: Secondary | ICD-10-CM | POA: Diagnosis not present

## 2020-04-26 DIAGNOSIS — D696 Thrombocytopenia, unspecified: Secondary | ICD-10-CM | POA: Diagnosis not present

## 2020-04-26 DIAGNOSIS — E872 Acidosis: Secondary | ICD-10-CM | POA: Diagnosis not present

## 2020-04-26 DIAGNOSIS — R809 Proteinuria, unspecified: Secondary | ICD-10-CM | POA: Diagnosis not present

## 2020-04-26 DIAGNOSIS — N189 Chronic kidney disease, unspecified: Secondary | ICD-10-CM | POA: Diagnosis not present

## 2020-04-26 DIAGNOSIS — I1310 Hypertensive heart and chronic kidney disease without heart failure, with stage 1 through stage 4 chronic kidney disease, or unspecified chronic kidney disease: Secondary | ICD-10-CM | POA: Diagnosis not present

## 2020-04-26 DIAGNOSIS — N184 Chronic kidney disease, stage 4 (severe): Secondary | ICD-10-CM | POA: Diagnosis not present

## 2020-04-26 DIAGNOSIS — I5042 Chronic combined systolic (congestive) and diastolic (congestive) heart failure: Secondary | ICD-10-CM | POA: Diagnosis not present

## 2020-05-21 DIAGNOSIS — I10 Essential (primary) hypertension: Secondary | ICD-10-CM | POA: Diagnosis not present

## 2020-05-21 DIAGNOSIS — R0602 Shortness of breath: Secondary | ICD-10-CM | POA: Diagnosis not present

## 2020-05-21 DIAGNOSIS — I429 Cardiomyopathy, unspecified: Secondary | ICD-10-CM | POA: Diagnosis not present

## 2020-05-21 DIAGNOSIS — R2689 Other abnormalities of gait and mobility: Secondary | ICD-10-CM | POA: Diagnosis not present

## 2020-05-21 DIAGNOSIS — Z299 Encounter for prophylactic measures, unspecified: Secondary | ICD-10-CM | POA: Diagnosis not present

## 2020-05-21 DIAGNOSIS — Z87891 Personal history of nicotine dependence: Secondary | ICD-10-CM | POA: Diagnosis not present

## 2020-05-21 DIAGNOSIS — R634 Abnormal weight loss: Secondary | ICD-10-CM | POA: Diagnosis not present

## 2020-05-21 DIAGNOSIS — R270 Ataxia, unspecified: Secondary | ICD-10-CM | POA: Diagnosis not present

## 2020-05-25 DIAGNOSIS — Z299 Encounter for prophylactic measures, unspecified: Secondary | ICD-10-CM | POA: Diagnosis not present

## 2020-05-25 DIAGNOSIS — R270 Ataxia, unspecified: Secondary | ICD-10-CM | POA: Diagnosis not present

## 2020-05-25 DIAGNOSIS — I10 Essential (primary) hypertension: Secondary | ICD-10-CM | POA: Diagnosis not present

## 2020-05-25 DIAGNOSIS — R2689 Other abnormalities of gait and mobility: Secondary | ICD-10-CM | POA: Diagnosis not present

## 2020-05-25 DIAGNOSIS — I739 Peripheral vascular disease, unspecified: Secondary | ICD-10-CM | POA: Diagnosis not present

## 2020-05-25 DIAGNOSIS — M25569 Pain in unspecified knee: Secondary | ICD-10-CM | POA: Diagnosis not present

## 2020-05-29 DIAGNOSIS — I10 Essential (primary) hypertension: Secondary | ICD-10-CM | POA: Diagnosis not present

## 2020-05-29 DIAGNOSIS — I429 Cardiomyopathy, unspecified: Secondary | ICD-10-CM | POA: Diagnosis not present

## 2020-05-29 DIAGNOSIS — I5022 Chronic systolic (congestive) heart failure: Secondary | ICD-10-CM | POA: Diagnosis not present

## 2020-05-29 DIAGNOSIS — Z299 Encounter for prophylactic measures, unspecified: Secondary | ICD-10-CM | POA: Diagnosis not present

## 2020-05-29 DIAGNOSIS — M109 Gout, unspecified: Secondary | ICD-10-CM | POA: Diagnosis not present

## 2020-06-01 DIAGNOSIS — I429 Cardiomyopathy, unspecified: Secondary | ICD-10-CM | POA: Diagnosis not present

## 2020-06-01 DIAGNOSIS — F200 Paranoid schizophrenia: Secondary | ICD-10-CM | POA: Diagnosis not present

## 2020-06-01 DIAGNOSIS — M25561 Pain in right knee: Secondary | ICD-10-CM | POA: Diagnosis not present

## 2020-06-01 DIAGNOSIS — I10 Essential (primary) hypertension: Secondary | ICD-10-CM | POA: Diagnosis not present

## 2020-06-01 DIAGNOSIS — Z299 Encounter for prophylactic measures, unspecified: Secondary | ICD-10-CM | POA: Diagnosis not present

## 2020-06-05 DIAGNOSIS — R270 Ataxia, unspecified: Secondary | ICD-10-CM | POA: Diagnosis not present

## 2020-06-05 DIAGNOSIS — Z299 Encounter for prophylactic measures, unspecified: Secondary | ICD-10-CM | POA: Diagnosis not present

## 2020-06-05 DIAGNOSIS — I10 Essential (primary) hypertension: Secondary | ICD-10-CM | POA: Diagnosis not present

## 2020-06-05 DIAGNOSIS — I429 Cardiomyopathy, unspecified: Secondary | ICD-10-CM | POA: Diagnosis not present

## 2020-06-05 DIAGNOSIS — F200 Paranoid schizophrenia: Secondary | ICD-10-CM | POA: Diagnosis not present

## 2020-06-05 DIAGNOSIS — I5022 Chronic systolic (congestive) heart failure: Secondary | ICD-10-CM | POA: Diagnosis not present

## 2020-06-06 DIAGNOSIS — R9089 Other abnormal findings on diagnostic imaging of central nervous system: Secondary | ICD-10-CM | POA: Diagnosis not present

## 2020-06-06 DIAGNOSIS — R42 Dizziness and giddiness: Secondary | ICD-10-CM | POA: Diagnosis not present

## 2020-06-21 DIAGNOSIS — I129 Hypertensive chronic kidney disease with stage 1 through stage 4 chronic kidney disease, or unspecified chronic kidney disease: Secondary | ICD-10-CM | POA: Diagnosis not present

## 2020-06-21 DIAGNOSIS — R809 Proteinuria, unspecified: Secondary | ICD-10-CM | POA: Diagnosis not present

## 2020-06-21 DIAGNOSIS — E872 Acidosis: Secondary | ICD-10-CM | POA: Diagnosis not present

## 2020-06-21 DIAGNOSIS — N184 Chronic kidney disease, stage 4 (severe): Secondary | ICD-10-CM | POA: Diagnosis not present

## 2020-06-21 DIAGNOSIS — N189 Chronic kidney disease, unspecified: Secondary | ICD-10-CM | POA: Diagnosis not present

## 2020-06-21 DIAGNOSIS — D696 Thrombocytopenia, unspecified: Secondary | ICD-10-CM | POA: Diagnosis not present

## 2020-06-21 DIAGNOSIS — I1310 Hypertensive heart and chronic kidney disease without heart failure, with stage 1 through stage 4 chronic kidney disease, or unspecified chronic kidney disease: Secondary | ICD-10-CM | POA: Diagnosis not present

## 2020-06-21 DIAGNOSIS — I5042 Chronic combined systolic (congestive) and diastolic (congestive) heart failure: Secondary | ICD-10-CM | POA: Diagnosis not present

## 2020-06-28 ENCOUNTER — Ambulatory Visit: Payer: Medicare Other | Admitting: Cardiovascular Disease

## 2020-07-03 DIAGNOSIS — I10 Essential (primary) hypertension: Secondary | ICD-10-CM | POA: Diagnosis not present

## 2020-07-03 DIAGNOSIS — I5022 Chronic systolic (congestive) heart failure: Secondary | ICD-10-CM | POA: Diagnosis not present

## 2020-07-03 DIAGNOSIS — Z299 Encounter for prophylactic measures, unspecified: Secondary | ICD-10-CM | POA: Diagnosis not present

## 2020-07-03 DIAGNOSIS — I739 Peripheral vascular disease, unspecified: Secondary | ICD-10-CM | POA: Diagnosis not present

## 2020-07-03 DIAGNOSIS — N184 Chronic kidney disease, stage 4 (severe): Secondary | ICD-10-CM | POA: Diagnosis not present

## 2020-07-03 DIAGNOSIS — I429 Cardiomyopathy, unspecified: Secondary | ICD-10-CM | POA: Diagnosis not present

## 2020-07-04 DIAGNOSIS — R809 Proteinuria, unspecified: Secondary | ICD-10-CM | POA: Diagnosis not present

## 2020-07-04 DIAGNOSIS — I129 Hypertensive chronic kidney disease with stage 1 through stage 4 chronic kidney disease, or unspecified chronic kidney disease: Secondary | ICD-10-CM | POA: Diagnosis not present

## 2020-07-04 DIAGNOSIS — N189 Chronic kidney disease, unspecified: Secondary | ICD-10-CM | POA: Diagnosis not present

## 2020-07-04 DIAGNOSIS — I5042 Chronic combined systolic (congestive) and diastolic (congestive) heart failure: Secondary | ICD-10-CM | POA: Diagnosis not present

## 2020-07-04 DIAGNOSIS — N184 Chronic kidney disease, stage 4 (severe): Secondary | ICD-10-CM | POA: Diagnosis not present

## 2020-07-04 DIAGNOSIS — D696 Thrombocytopenia, unspecified: Secondary | ICD-10-CM | POA: Diagnosis not present

## 2020-07-04 DIAGNOSIS — I1310 Hypertensive heart and chronic kidney disease without heart failure, with stage 1 through stage 4 chronic kidney disease, or unspecified chronic kidney disease: Secondary | ICD-10-CM | POA: Diagnosis not present

## 2020-07-12 DIAGNOSIS — R531 Weakness: Secondary | ICD-10-CM | POA: Diagnosis not present

## 2020-07-12 DIAGNOSIS — M79671 Pain in right foot: Secondary | ICD-10-CM | POA: Diagnosis not present

## 2020-07-12 DIAGNOSIS — Z9181 History of falling: Secondary | ICD-10-CM | POA: Diagnosis not present

## 2020-07-12 DIAGNOSIS — M25562 Pain in left knee: Secondary | ICD-10-CM | POA: Diagnosis not present

## 2020-07-12 DIAGNOSIS — R262 Difficulty in walking, not elsewhere classified: Secondary | ICD-10-CM | POA: Diagnosis not present

## 2020-07-16 DIAGNOSIS — R531 Weakness: Secondary | ICD-10-CM | POA: Diagnosis not present

## 2020-07-16 DIAGNOSIS — M25562 Pain in left knee: Secondary | ICD-10-CM | POA: Diagnosis not present

## 2020-07-16 DIAGNOSIS — Z9181 History of falling: Secondary | ICD-10-CM | POA: Diagnosis not present

## 2020-07-16 DIAGNOSIS — R262 Difficulty in walking, not elsewhere classified: Secondary | ICD-10-CM | POA: Diagnosis not present

## 2020-07-16 DIAGNOSIS — M79671 Pain in right foot: Secondary | ICD-10-CM | POA: Diagnosis not present

## 2020-07-17 DIAGNOSIS — M25562 Pain in left knee: Secondary | ICD-10-CM | POA: Diagnosis not present

## 2020-07-17 DIAGNOSIS — M79671 Pain in right foot: Secondary | ICD-10-CM | POA: Diagnosis not present

## 2020-07-17 DIAGNOSIS — R262 Difficulty in walking, not elsewhere classified: Secondary | ICD-10-CM | POA: Diagnosis not present

## 2020-07-17 DIAGNOSIS — R531 Weakness: Secondary | ICD-10-CM | POA: Diagnosis not present

## 2020-07-17 DIAGNOSIS — Z9181 History of falling: Secondary | ICD-10-CM | POA: Diagnosis not present

## 2020-07-18 DIAGNOSIS — R262 Difficulty in walking, not elsewhere classified: Secondary | ICD-10-CM | POA: Diagnosis not present

## 2020-07-18 DIAGNOSIS — Z9181 History of falling: Secondary | ICD-10-CM | POA: Diagnosis not present

## 2020-07-18 DIAGNOSIS — R531 Weakness: Secondary | ICD-10-CM | POA: Diagnosis not present

## 2020-07-18 DIAGNOSIS — M79671 Pain in right foot: Secondary | ICD-10-CM | POA: Diagnosis not present

## 2020-07-18 DIAGNOSIS — M25562 Pain in left knee: Secondary | ICD-10-CM | POA: Diagnosis not present

## 2020-07-20 DIAGNOSIS — Z87891 Personal history of nicotine dependence: Secondary | ICD-10-CM | POA: Diagnosis not present

## 2020-07-20 DIAGNOSIS — L03113 Cellulitis of right upper limb: Secondary | ICD-10-CM | POA: Diagnosis not present

## 2020-07-20 DIAGNOSIS — Z299 Encounter for prophylactic measures, unspecified: Secondary | ICD-10-CM | POA: Diagnosis not present

## 2020-07-20 DIAGNOSIS — I10 Essential (primary) hypertension: Secondary | ICD-10-CM | POA: Diagnosis not present

## 2020-07-20 DIAGNOSIS — N3281 Overactive bladder: Secondary | ICD-10-CM | POA: Diagnosis not present

## 2020-07-20 DIAGNOSIS — M109 Gout, unspecified: Secondary | ICD-10-CM | POA: Diagnosis not present

## 2020-07-20 DIAGNOSIS — R350 Frequency of micturition: Secondary | ICD-10-CM | POA: Diagnosis not present

## 2020-07-24 DIAGNOSIS — M79671 Pain in right foot: Secondary | ICD-10-CM | POA: Diagnosis not present

## 2020-07-24 DIAGNOSIS — R262 Difficulty in walking, not elsewhere classified: Secondary | ICD-10-CM | POA: Diagnosis not present

## 2020-07-24 DIAGNOSIS — R531 Weakness: Secondary | ICD-10-CM | POA: Diagnosis not present

## 2020-07-24 DIAGNOSIS — M25562 Pain in left knee: Secondary | ICD-10-CM | POA: Diagnosis not present

## 2020-07-24 DIAGNOSIS — Z9181 History of falling: Secondary | ICD-10-CM | POA: Diagnosis not present

## 2020-07-25 DIAGNOSIS — A0472 Enterocolitis due to Clostridium difficile, not specified as recurrent: Secondary | ICD-10-CM

## 2020-07-25 HISTORY — DX: Enterocolitis due to Clostridium difficile, not specified as recurrent: A04.72

## 2020-07-27 DIAGNOSIS — R262 Difficulty in walking, not elsewhere classified: Secondary | ICD-10-CM | POA: Diagnosis not present

## 2020-07-27 DIAGNOSIS — M79671 Pain in right foot: Secondary | ICD-10-CM | POA: Diagnosis not present

## 2020-07-27 DIAGNOSIS — Z9181 History of falling: Secondary | ICD-10-CM | POA: Diagnosis not present

## 2020-07-27 DIAGNOSIS — R531 Weakness: Secondary | ICD-10-CM | POA: Diagnosis not present

## 2020-07-27 DIAGNOSIS — M25562 Pain in left knee: Secondary | ICD-10-CM | POA: Diagnosis not present

## 2020-07-30 DIAGNOSIS — M79671 Pain in right foot: Secondary | ICD-10-CM | POA: Diagnosis not present

## 2020-07-30 DIAGNOSIS — R262 Difficulty in walking, not elsewhere classified: Secondary | ICD-10-CM | POA: Diagnosis not present

## 2020-07-30 DIAGNOSIS — Z9181 History of falling: Secondary | ICD-10-CM | POA: Diagnosis not present

## 2020-07-30 DIAGNOSIS — R531 Weakness: Secondary | ICD-10-CM | POA: Diagnosis not present

## 2020-07-30 DIAGNOSIS — M25562 Pain in left knee: Secondary | ICD-10-CM | POA: Diagnosis not present

## 2020-07-31 DIAGNOSIS — R531 Weakness: Secondary | ICD-10-CM | POA: Diagnosis not present

## 2020-07-31 DIAGNOSIS — M79671 Pain in right foot: Secondary | ICD-10-CM | POA: Diagnosis not present

## 2020-07-31 DIAGNOSIS — M25562 Pain in left knee: Secondary | ICD-10-CM | POA: Diagnosis not present

## 2020-07-31 DIAGNOSIS — Z9181 History of falling: Secondary | ICD-10-CM | POA: Diagnosis not present

## 2020-07-31 DIAGNOSIS — R262 Difficulty in walking, not elsewhere classified: Secondary | ICD-10-CM | POA: Diagnosis not present

## 2020-08-01 ENCOUNTER — Ambulatory Visit (INDEPENDENT_AMBULATORY_CARE_PROVIDER_SITE_OTHER): Payer: Medicare Other | Admitting: Neurology

## 2020-08-01 ENCOUNTER — Encounter: Payer: Self-pay | Admitting: Neurology

## 2020-08-01 VITALS — BP 120/67 | HR 65 | Ht 68.0 in | Wt 189.0 lb

## 2020-08-01 DIAGNOSIS — I2510 Atherosclerotic heart disease of native coronary artery without angina pectoris: Secondary | ICD-10-CM | POA: Diagnosis not present

## 2020-08-01 DIAGNOSIS — R269 Unspecified abnormalities of gait and mobility: Secondary | ICD-10-CM | POA: Diagnosis not present

## 2020-08-01 DIAGNOSIS — Z9181 History of falling: Secondary | ICD-10-CM

## 2020-08-01 DIAGNOSIS — Z79899 Other long term (current) drug therapy: Secondary | ICD-10-CM

## 2020-08-01 DIAGNOSIS — R2689 Other abnormalities of gait and mobility: Secondary | ICD-10-CM

## 2020-08-01 NOTE — Patient Instructions (Signed)
I believe you have a multifactorial gait disorder, meaning, that it is Glenn Hawkins is due to a combination of factors. These factors include: Prior falls, medication effect with multiple potentially sedating medications and medications that can cause dizziness and imbalance, not always hydrating optimally with water, normal aging, vision changes.  Please remember to stand up slowly and get your bearings first turn slowly, no bending down to pick anything, no heavy lifting, be extra careful at night and first thing in the morning. Also, be careful in the Bathroom and the kitchen.   I recommend that you continue with physical therapy.  Please asked them for advice regarding using a walker versus cane.  As far as your medications are concerned, I would like to suggest no new medications.   Please follow-up with your eye specialist.  You do have a cataract on the right side.  I am worried about your driving, please have your family monitor it and I would suggest that you talk to your primary care physician about your ability to continue to drive.    Please follow-up with your primary care physician.  We do not need to make a follow-up appointment here on a scheduled basis.
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
## 2020-08-06 DIAGNOSIS — Z9181 History of falling: Secondary | ICD-10-CM | POA: Diagnosis not present

## 2020-08-06 DIAGNOSIS — N184 Chronic kidney disease, stage 4 (severe): Secondary | ICD-10-CM | POA: Diagnosis not present

## 2020-08-06 DIAGNOSIS — M25562 Pain in left knee: Secondary | ICD-10-CM | POA: Diagnosis not present

## 2020-08-06 DIAGNOSIS — R262 Difficulty in walking, not elsewhere classified: Secondary | ICD-10-CM | POA: Diagnosis not present

## 2020-08-06 DIAGNOSIS — Z299 Encounter for prophylactic measures, unspecified: Secondary | ICD-10-CM | POA: Diagnosis not present

## 2020-08-06 DIAGNOSIS — M79671 Pain in right foot: Secondary | ICD-10-CM | POA: Diagnosis not present

## 2020-08-06 DIAGNOSIS — R531 Weakness: Secondary | ICD-10-CM | POA: Diagnosis not present

## 2020-08-06 DIAGNOSIS — I5022 Chronic systolic (congestive) heart failure: Secondary | ICD-10-CM | POA: Diagnosis not present

## 2020-08-06 DIAGNOSIS — L0390 Cellulitis, unspecified: Secondary | ICD-10-CM | POA: Diagnosis not present

## 2020-08-06 DIAGNOSIS — I10 Essential (primary) hypertension: Secondary | ICD-10-CM | POA: Diagnosis not present

## 2020-08-07 DIAGNOSIS — R262 Difficulty in walking, not elsewhere classified: Secondary | ICD-10-CM | POA: Diagnosis not present

## 2020-08-07 DIAGNOSIS — R531 Weakness: Secondary | ICD-10-CM | POA: Diagnosis not present

## 2020-08-07 DIAGNOSIS — M25562 Pain in left knee: Secondary | ICD-10-CM | POA: Diagnosis not present

## 2020-08-07 DIAGNOSIS — Z9181 History of falling: Secondary | ICD-10-CM | POA: Diagnosis not present

## 2020-08-07 DIAGNOSIS — M79671 Pain in right foot: Secondary | ICD-10-CM | POA: Diagnosis not present

## 2020-08-08 DIAGNOSIS — Z9181 History of falling: Secondary | ICD-10-CM | POA: Diagnosis not present

## 2020-08-08 DIAGNOSIS — M79671 Pain in right foot: Secondary | ICD-10-CM | POA: Diagnosis not present

## 2020-08-08 DIAGNOSIS — R262 Difficulty in walking, not elsewhere classified: Secondary | ICD-10-CM | POA: Diagnosis not present

## 2020-08-08 DIAGNOSIS — R531 Weakness: Secondary | ICD-10-CM | POA: Diagnosis not present

## 2020-08-08 DIAGNOSIS — M25562 Pain in left knee: Secondary | ICD-10-CM | POA: Diagnosis not present

## 2020-08-14 DIAGNOSIS — I255 Ischemic cardiomyopathy: Secondary | ICD-10-CM | POA: Diagnosis not present

## 2020-08-14 DIAGNOSIS — R42 Dizziness and giddiness: Secondary | ICD-10-CM | POA: Diagnosis not present

## 2020-08-14 DIAGNOSIS — R509 Fever, unspecified: Secondary | ICD-10-CM | POA: Diagnosis not present

## 2020-08-14 DIAGNOSIS — E860 Dehydration: Secondary | ICD-10-CM | POA: Diagnosis not present

## 2020-08-14 DIAGNOSIS — D72829 Elevated white blood cell count, unspecified: Secondary | ICD-10-CM | POA: Diagnosis not present

## 2020-08-14 DIAGNOSIS — Z20822 Contact with and (suspected) exposure to covid-19: Secondary | ICD-10-CM | POA: Diagnosis not present

## 2020-08-14 DIAGNOSIS — M25562 Pain in left knee: Secondary | ICD-10-CM | POA: Diagnosis not present

## 2020-08-14 DIAGNOSIS — Z9181 History of falling: Secondary | ICD-10-CM | POA: Diagnosis not present

## 2020-08-14 DIAGNOSIS — R531 Weakness: Secondary | ICD-10-CM | POA: Diagnosis not present

## 2020-08-14 DIAGNOSIS — N184 Chronic kidney disease, stage 4 (severe): Secondary | ICD-10-CM | POA: Diagnosis not present

## 2020-08-14 DIAGNOSIS — M79671 Pain in right foot: Secondary | ICD-10-CM | POA: Diagnosis not present

## 2020-08-14 DIAGNOSIS — R519 Headache, unspecified: Secondary | ICD-10-CM | POA: Diagnosis not present

## 2020-08-14 DIAGNOSIS — I2510 Atherosclerotic heart disease of native coronary artery without angina pectoris: Secondary | ICD-10-CM | POA: Diagnosis not present

## 2020-08-14 DIAGNOSIS — A0472 Enterocolitis due to Clostridium difficile, not specified as recurrent: Secondary | ICD-10-CM | POA: Diagnosis not present

## 2020-08-14 DIAGNOSIS — R9431 Abnormal electrocardiogram [ECG] [EKG]: Secondary | ICD-10-CM | POA: Diagnosis not present

## 2020-08-14 DIAGNOSIS — N179 Acute kidney failure, unspecified: Secondary | ICD-10-CM | POA: Diagnosis not present

## 2020-08-14 DIAGNOSIS — R262 Difficulty in walking, not elsewhere classified: Secondary | ICD-10-CM | POA: Diagnosis not present

## 2020-08-14 DIAGNOSIS — E876 Hypokalemia: Secondary | ICD-10-CM | POA: Diagnosis not present

## 2020-08-14 DIAGNOSIS — R2681 Unsteadiness on feet: Secondary | ICD-10-CM | POA: Diagnosis not present

## 2020-08-15 DIAGNOSIS — N179 Acute kidney failure, unspecified: Secondary | ICD-10-CM | POA: Diagnosis not present

## 2020-08-15 DIAGNOSIS — R531 Weakness: Secondary | ICD-10-CM | POA: Diagnosis not present

## 2020-08-15 DIAGNOSIS — R42 Dizziness and giddiness: Secondary | ICD-10-CM | POA: Diagnosis not present

## 2020-08-15 DIAGNOSIS — A0472 Enterocolitis due to Clostridium difficile, not specified as recurrent: Secondary | ICD-10-CM | POA: Diagnosis not present

## 2020-08-21 DIAGNOSIS — Z9181 History of falling: Secondary | ICD-10-CM | POA: Diagnosis not present

## 2020-08-21 DIAGNOSIS — M25562 Pain in left knee: Secondary | ICD-10-CM | POA: Diagnosis not present

## 2020-08-21 DIAGNOSIS — M79671 Pain in right foot: Secondary | ICD-10-CM | POA: Diagnosis not present

## 2020-08-21 DIAGNOSIS — R531 Weakness: Secondary | ICD-10-CM | POA: Diagnosis not present

## 2020-08-21 DIAGNOSIS — R262 Difficulty in walking, not elsewhere classified: Secondary | ICD-10-CM | POA: Diagnosis not present

## 2020-08-22 ENCOUNTER — Ambulatory Visit: Payer: Medicare Other | Admitting: Diagnostic Neuroimaging

## 2020-08-24 DIAGNOSIS — I429 Cardiomyopathy, unspecified: Secondary | ICD-10-CM | POA: Diagnosis not present

## 2020-08-24 DIAGNOSIS — A0472 Enterocolitis due to Clostridium difficile, not specified as recurrent: Secondary | ICD-10-CM | POA: Diagnosis not present

## 2020-08-24 DIAGNOSIS — Z299 Encounter for prophylactic measures, unspecified: Secondary | ICD-10-CM | POA: Diagnosis not present

## 2020-08-24 DIAGNOSIS — I5022 Chronic systolic (congestive) heart failure: Secondary | ICD-10-CM | POA: Diagnosis not present

## 2020-08-24 DIAGNOSIS — I10 Essential (primary) hypertension: Secondary | ICD-10-CM | POA: Diagnosis not present

## 2020-09-03 DIAGNOSIS — M25562 Pain in left knee: Secondary | ICD-10-CM | POA: Diagnosis not present

## 2020-09-03 DIAGNOSIS — R262 Difficulty in walking, not elsewhere classified: Secondary | ICD-10-CM | POA: Diagnosis not present

## 2020-09-03 DIAGNOSIS — Z9181 History of falling: Secondary | ICD-10-CM | POA: Diagnosis not present

## 2020-09-03 DIAGNOSIS — M79671 Pain in right foot: Secondary | ICD-10-CM | POA: Diagnosis not present

## 2020-09-03 DIAGNOSIS — R531 Weakness: Secondary | ICD-10-CM | POA: Diagnosis not present

## 2020-09-04 DIAGNOSIS — I5042 Chronic combined systolic (congestive) and diastolic (congestive) heart failure: Secondary | ICD-10-CM | POA: Diagnosis not present

## 2020-09-04 DIAGNOSIS — D696 Thrombocytopenia, unspecified: Secondary | ICD-10-CM | POA: Diagnosis not present

## 2020-09-04 DIAGNOSIS — N189 Chronic kidney disease, unspecified: Secondary | ICD-10-CM | POA: Diagnosis not present

## 2020-09-04 DIAGNOSIS — N184 Chronic kidney disease, stage 4 (severe): Secondary | ICD-10-CM | POA: Diagnosis not present

## 2020-09-04 DIAGNOSIS — R809 Proteinuria, unspecified: Secondary | ICD-10-CM | POA: Diagnosis not present

## 2020-09-04 DIAGNOSIS — I129 Hypertensive chronic kidney disease with stage 1 through stage 4 chronic kidney disease, or unspecified chronic kidney disease: Secondary | ICD-10-CM | POA: Diagnosis not present

## 2020-09-04 DIAGNOSIS — I1310 Hypertensive heart and chronic kidney disease without heart failure, with stage 1 through stage 4 chronic kidney disease, or unspecified chronic kidney disease: Secondary | ICD-10-CM | POA: Diagnosis not present

## 2020-09-05 DIAGNOSIS — I129 Hypertensive chronic kidney disease with stage 1 through stage 4 chronic kidney disease, or unspecified chronic kidney disease: Secondary | ICD-10-CM | POA: Diagnosis not present

## 2020-09-05 DIAGNOSIS — I1310 Hypertensive heart and chronic kidney disease without heart failure, with stage 1 through stage 4 chronic kidney disease, or unspecified chronic kidney disease: Secondary | ICD-10-CM | POA: Diagnosis not present

## 2020-09-05 DIAGNOSIS — N184 Chronic kidney disease, stage 4 (severe): Secondary | ICD-10-CM | POA: Diagnosis not present

## 2020-09-05 DIAGNOSIS — D696 Thrombocytopenia, unspecified: Secondary | ICD-10-CM | POA: Diagnosis not present

## 2020-09-05 DIAGNOSIS — I5042 Chronic combined systolic (congestive) and diastolic (congestive) heart failure: Secondary | ICD-10-CM | POA: Diagnosis not present

## 2020-09-05 DIAGNOSIS — R809 Proteinuria, unspecified: Secondary | ICD-10-CM | POA: Diagnosis not present

## 2020-09-05 DIAGNOSIS — N189 Chronic kidney disease, unspecified: Secondary | ICD-10-CM | POA: Diagnosis not present

## 2020-09-07 DIAGNOSIS — M79671 Pain in right foot: Secondary | ICD-10-CM | POA: Diagnosis not present

## 2020-09-07 DIAGNOSIS — M25562 Pain in left knee: Secondary | ICD-10-CM | POA: Diagnosis not present

## 2020-09-07 DIAGNOSIS — Z9181 History of falling: Secondary | ICD-10-CM | POA: Diagnosis not present

## 2020-09-07 DIAGNOSIS — R262 Difficulty in walking, not elsewhere classified: Secondary | ICD-10-CM | POA: Diagnosis not present

## 2020-09-07 DIAGNOSIS — R531 Weakness: Secondary | ICD-10-CM | POA: Diagnosis not present

## 2020-09-12 DIAGNOSIS — R42 Dizziness and giddiness: Secondary | ICD-10-CM | POA: Diagnosis not present

## 2020-09-12 DIAGNOSIS — E7220 Disorder of urea cycle metabolism, unspecified: Secondary | ICD-10-CM | POA: Diagnosis not present

## 2020-09-12 DIAGNOSIS — Z20822 Contact with and (suspected) exposure to covid-19: Secondary | ICD-10-CM | POA: Diagnosis not present

## 2020-09-12 DIAGNOSIS — I517 Cardiomegaly: Secondary | ICD-10-CM | POA: Diagnosis not present

## 2020-09-12 DIAGNOSIS — R269 Unspecified abnormalities of gait and mobility: Secondary | ICD-10-CM | POA: Diagnosis not present

## 2020-09-12 DIAGNOSIS — I6381 Other cerebral infarction due to occlusion or stenosis of small artery: Secondary | ICD-10-CM | POA: Diagnosis not present

## 2020-09-12 DIAGNOSIS — R9431 Abnormal electrocardiogram [ECG] [EKG]: Secondary | ICD-10-CM | POA: Diagnosis not present

## 2020-09-12 DIAGNOSIS — R4182 Altered mental status, unspecified: Secondary | ICD-10-CM | POA: Diagnosis not present

## 2020-09-12 DIAGNOSIS — I739 Peripheral vascular disease, unspecified: Secondary | ICD-10-CM | POA: Diagnosis not present

## 2020-09-14 DIAGNOSIS — I129 Hypertensive chronic kidney disease with stage 1 through stage 4 chronic kidney disease, or unspecified chronic kidney disease: Secondary | ICD-10-CM | POA: Diagnosis not present

## 2020-09-14 DIAGNOSIS — I5042 Chronic combined systolic (congestive) and diastolic (congestive) heart failure: Secondary | ICD-10-CM | POA: Diagnosis not present

## 2020-09-14 DIAGNOSIS — D696 Thrombocytopenia, unspecified: Secondary | ICD-10-CM | POA: Diagnosis not present

## 2020-09-14 DIAGNOSIS — D638 Anemia in other chronic diseases classified elsewhere: Secondary | ICD-10-CM | POA: Diagnosis not present

## 2020-09-14 DIAGNOSIS — E872 Acidosis: Secondary | ICD-10-CM | POA: Diagnosis not present

## 2020-09-14 DIAGNOSIS — N184 Chronic kidney disease, stage 4 (severe): Secondary | ICD-10-CM | POA: Diagnosis not present

## 2020-09-14 DIAGNOSIS — R809 Proteinuria, unspecified: Secondary | ICD-10-CM | POA: Diagnosis not present

## 2020-09-20 DIAGNOSIS — I5022 Chronic systolic (congestive) heart failure: Secondary | ICD-10-CM | POA: Diagnosis not present

## 2020-09-20 DIAGNOSIS — R634 Abnormal weight loss: Secondary | ICD-10-CM | POA: Diagnosis not present

## 2020-09-20 DIAGNOSIS — D6869 Other thrombophilia: Secondary | ICD-10-CM | POA: Diagnosis not present

## 2020-09-20 DIAGNOSIS — M79671 Pain in right foot: Secondary | ICD-10-CM | POA: Diagnosis not present

## 2020-09-20 DIAGNOSIS — Z299 Encounter for prophylactic measures, unspecified: Secondary | ICD-10-CM | POA: Diagnosis not present

## 2020-09-20 DIAGNOSIS — Z9181 History of falling: Secondary | ICD-10-CM | POA: Diagnosis not present

## 2020-09-20 DIAGNOSIS — R531 Weakness: Secondary | ICD-10-CM | POA: Diagnosis not present

## 2020-09-20 DIAGNOSIS — F200 Paranoid schizophrenia: Secondary | ICD-10-CM | POA: Diagnosis not present

## 2020-09-20 DIAGNOSIS — I10 Essential (primary) hypertension: Secondary | ICD-10-CM | POA: Diagnosis not present

## 2020-09-20 DIAGNOSIS — R262 Difficulty in walking, not elsewhere classified: Secondary | ICD-10-CM | POA: Diagnosis not present

## 2020-09-20 DIAGNOSIS — M25562 Pain in left knee: Secondary | ICD-10-CM | POA: Diagnosis not present

## 2020-09-25 DIAGNOSIS — R531 Weakness: Secondary | ICD-10-CM | POA: Diagnosis not present

## 2020-09-25 DIAGNOSIS — Z9181 History of falling: Secondary | ICD-10-CM | POA: Diagnosis not present

## 2020-09-25 DIAGNOSIS — R262 Difficulty in walking, not elsewhere classified: Secondary | ICD-10-CM | POA: Diagnosis not present

## 2020-09-25 DIAGNOSIS — M25562 Pain in left knee: Secondary | ICD-10-CM | POA: Diagnosis not present

## 2020-09-25 DIAGNOSIS — M79671 Pain in right foot: Secondary | ICD-10-CM | POA: Diagnosis not present

## 2020-09-28 DIAGNOSIS — R262 Difficulty in walking, not elsewhere classified: Secondary | ICD-10-CM | POA: Diagnosis not present

## 2020-09-28 DIAGNOSIS — M25562 Pain in left knee: Secondary | ICD-10-CM | POA: Diagnosis not present

## 2020-09-28 DIAGNOSIS — R531 Weakness: Secondary | ICD-10-CM | POA: Diagnosis not present

## 2020-09-28 DIAGNOSIS — Z9181 History of falling: Secondary | ICD-10-CM | POA: Diagnosis not present

## 2020-09-28 DIAGNOSIS — M79671 Pain in right foot: Secondary | ICD-10-CM | POA: Diagnosis not present

## 2020-10-09 ENCOUNTER — Ambulatory Visit: Payer: Medicare Other | Admitting: Cardiovascular Disease

## 2020-10-12 ENCOUNTER — Other Ambulatory Visit: Payer: Self-pay | Admitting: Cardiovascular Disease

## 2020-10-12 NOTE — Telephone Encounter (Signed)
No ans, no VM

## 2020-10-12 NOTE — Telephone Encounter (Signed)
Please reschedule F/U appointment. Thank you!

## 2020-10-17 DIAGNOSIS — Q453 Other congenital malformations of pancreas and pancreatic duct: Secondary | ICD-10-CM | POA: Diagnosis not present

## 2020-10-17 DIAGNOSIS — I5022 Chronic systolic (congestive) heart failure: Secondary | ICD-10-CM | POA: Diagnosis not present

## 2020-10-17 DIAGNOSIS — K551 Chronic vascular disorders of intestine: Secondary | ICD-10-CM | POA: Diagnosis not present

## 2020-10-17 DIAGNOSIS — N135 Crossing vessel and stricture of ureter without hydronephrosis: Secondary | ICD-10-CM | POA: Diagnosis not present

## 2020-10-17 DIAGNOSIS — Z299 Encounter for prophylactic measures, unspecified: Secondary | ICD-10-CM | POA: Diagnosis not present

## 2020-10-17 DIAGNOSIS — I10 Essential (primary) hypertension: Secondary | ICD-10-CM | POA: Diagnosis not present

## 2020-10-24 DIAGNOSIS — S0232XA Fracture of orbital floor, left side, initial encounter for closed fracture: Secondary | ICD-10-CM | POA: Diagnosis not present

## 2020-10-24 DIAGNOSIS — Z20822 Contact with and (suspected) exposure to covid-19: Secondary | ICD-10-CM | POA: Diagnosis not present

## 2020-10-24 DIAGNOSIS — R9431 Abnormal electrocardiogram [ECG] [EKG]: Secondary | ICD-10-CM | POA: Diagnosis not present

## 2020-10-24 DIAGNOSIS — R739 Hyperglycemia, unspecified: Secondary | ICD-10-CM | POA: Diagnosis not present

## 2020-10-24 DIAGNOSIS — R4182 Altered mental status, unspecified: Secondary | ICD-10-CM | POA: Diagnosis not present

## 2020-10-24 DIAGNOSIS — R531 Weakness: Secondary | ICD-10-CM | POA: Diagnosis not present

## 2020-10-24 DIAGNOSIS — K7290 Hepatic failure, unspecified without coma: Secondary | ICD-10-CM | POA: Diagnosis not present

## 2020-10-24 DIAGNOSIS — G9340 Encephalopathy, unspecified: Secondary | ICD-10-CM | POA: Diagnosis not present

## 2020-10-26 ENCOUNTER — Encounter: Payer: Self-pay | Admitting: Internal Medicine

## 2020-11-02 DIAGNOSIS — K551 Chronic vascular disorders of intestine: Secondary | ICD-10-CM | POA: Diagnosis not present

## 2020-11-02 DIAGNOSIS — F200 Paranoid schizophrenia: Secondary | ICD-10-CM | POA: Diagnosis not present

## 2020-11-02 DIAGNOSIS — Z299 Encounter for prophylactic measures, unspecified: Secondary | ICD-10-CM | POA: Diagnosis not present

## 2020-11-02 DIAGNOSIS — I10 Essential (primary) hypertension: Secondary | ICD-10-CM | POA: Diagnosis not present

## 2020-11-02 DIAGNOSIS — I5022 Chronic systolic (congestive) heart failure: Secondary | ICD-10-CM | POA: Diagnosis not present

## 2020-11-07 ENCOUNTER — Encounter: Payer: Self-pay | Admitting: Gastroenterology

## 2020-11-07 NOTE — Progress Notes (Signed)
Referring Provider: Glenda Chroman, MD Primary Care Physician:  Glenda Chroman, MD Primary Gastroenterologist:  Dr. Abbey Chatters  Chief Complaint  Patient presents with   pancreatic mass     HPI:   Glenn Hawkins is a 65 y.o. male presenting today at the request of  Vyas, Dhruv B, MD for pancreatic mass. Also discussed significant unintentional weight loss, indigestion, abdominal pain, diarrhea, elevated ammonia, mental status changes, vascular abnormalities on outside CT.   Per chart review: - Hospitalized June 2022 at [facility] with C. difficile colitis. - Presented to [facility] on 09/12/2020 with dizziness/gait instability and sister noticing some confusion.  Ammonia was slightly elevated at 38.  Creatinine elevated at 2.8.  He was discharged with 3 days of lactulose 45 mL daily and advised to follow-up with PCP. - Patient was seen again in the emergency room on 8/31 at [facility] with confusion/altered mental status.  He was again found to have mildly elevated ammonia at 44.  Creatinine elevated at 4.76, BUN 65 CT head negative.  He was given lactulose, IV fluids followed by 20 mg of Lasix and discharged with prescription of lactulose.  Recommended follow-up with PCP.  Reviewed information included in referral: - Office visit with PCP 10/17/2020.   Patient was presenting for hospital follow-up of paranoia.  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  Patient was doing MRI/MRA, but left hospital and wanted outpatient work-up.  Noted 48 pound weight loss in 1 year. -Impression included of CT A/P without contrast dated 10/05/2020:  No definite evidence of malignancy.  Indeterminate 1.1 cm left adrenal lesion.  Possible calcified hypoattenuating pancreatic  lesion within the pancreatic body.  Ill-defined stranding and soft tissue within the retroperitoneum with medial deviation of the proximal ureter suspicious for retroperitoneal fibrosis.  Distal infrarenal abdominal aorta and proximal bilateral common iliac arteries are markedly narrowed or occluded.  The presumed IMA is enlarged/engorged and is likely providing collateral flow to bilateral iliac system though evaluation of the vascular system is markedly limited without IV contrast.  Scattered sub-6 mm pulmonary nodules. -Reviewed labs included that were collected on 8/10, 8/12, 8/18 detailed below in lab results.  Abnormalities included AST 45-48 (H), albumin 3.4 (L), uric acid 9.0 (H), creatinine 2.6 (H), free T4 0.48 (L), hemoglobin 11.5 (L), platelets 88 (L), sed rate 40 (H).    Today:  Presents with sister today who helps provide history.  217 lbs August 2021. 166 lbs 11/08/20  Patient reports loss of appetite.  Has to make himself eat.  Patient sister states they lost their mom in December last year.  Patient lives with her, now he is home alone.  Sisters used to come over and prepare food for their mother and brother when mother was living, now this occurs less frequent.  Sister states there is food readily available for patient to eat, but he does not have the initiative to get up and get the food.  He rarely cooks.  States if someone present him with food, he is more likely to eat. States they sometimes bring food over and will come later in the week and see containers in the refrigerator still full of food.  Sister also states that he is often in the bed until noon so he eats breakfast around  lunchtime.  He may or may not eat dinner.  Patient reports he had an incident where he was seeing maggots/bugs in his food, snakes and mice under the sofa which also affected his eating.  He has not been saying this recently.  Occasional postprandial mid/upper abdominal pain. Few times a week.  Intermittent indigestion. Used to take Nexium which helped with this, but has not been taking Nexium lately.  States he has to buy it overt the counter as he stopped receiving the Rx, not sure why. Hasn't asked for refill.  Denies nausea or vomiting. No dysphagia.   4-5 Bms per day. More he eats, the more Bms he has. Occasional nocturnal stools. Can be watery or formed, but seems to have more trouble with diarrhea. Has had accidents. History of C diff a few months ago. No blood in the stool or black stools.   No alcohol or drug use currently. Used to drink alcohol daily 20+ years ago. 20+ years ago, he used intranasal cocaine, speed. No IV drug use.  No known history of liver disease. Not taking lactulose.   Reports colonoscopy and EGD years ago at the [place], prior to 2017.  Twin sister has crohn's disease.  No family history of colon cancer.  *It was difficult to get a clear history from patient today.  He seems somewhat lethargic with unsteady gait, droopy eyes.  Patient sister reports he is close to his baseline, but notes similar symptoms previously when his medications were "messed up".  Patient manages his own medications and lives alone.  Patient sister unable to confirm what medications he is taking today.  I have requested a call back to confirm medication list when they get home.    After Office Visit:  - I was able to locate CT A/P without contrast reports embedded within progress note dated 10/05/2020 in care everywhere. (See imaging below) - Spoke with patient and his sister around 4:30 PM.  Updated medication list. Also advised that I was able to locate CT report in Care Everywhere and update on plan detailed below. See Assessment and Plan.    Past Medical History:  Diagnosis Date   Aortic insufficiency    a. 08/2019 Echo: mild to mod AI.   C. difficile diarrhea 07/2020   CKD (chronic kidney disease) stage 3, GFR 30-59 ml/min (HCC)    baseline Cr around 2.5   Coronary  atherosclerosis of native coronary artery    a. 02/2011 Late presentation MI-->Myoview w/ large area of transmural infarct in LCX distribution, no ischemia.   Depressive disorder, not elsewhere classified    Esophageal reflux    Gout, unspecified    Hemorrhoids    a. 01/2016 s/p hemorrhoidectomy.   HFrEF (heart failure with reduced ejection fraction) ([place])    a. 04/2012 Echo: EF 40%, inflat AK, Gr1 DD, mild AI/MR, triv TR; b. 11/2017 EchoP EF 30-35%, inflat HK; c. 08/2019 Echo: EF 30-35%, gr1 DD, inflat AK.   History of DVT (deep vein thrombosis)    a. Chronic Eliquis.   Hyperlipidemia LDL goal <70    Hypertension    Hypotension, unspecified    Ischemic cardiomyopathy    a. 04/2012 Echo: EF 40%, inflat AK, Gr1 DD, mild AI/MR, triv TR; b. 11/2017 EchoP EF 30-35%, inflat HK; c. 08/2019 Echo: EF 30-35%, gr1 DD, inflat AK. Nl RV size/fxn. Mild MR. Mild to mod AI.   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 04/2012 Echo: EF 40%, mild MR.   Myocardial  infarction (lateral wall) ([place]) 2013   a. 02/2011 - late presentation. Managed conservatively 2/2 CKD;  b. 02/2011 Myoview: Large transmural infarct in the LCX distribution, no ischemia.   PAD (peripheral artery disease) ([place])    a. 08/2015 ABI/duplex: R: 0.86, L 0.96. Duplex w/ bilateral heterogeneous plaque in mid fem arteries, no significant stenoses; b. 08/2019 ABI/Duplex: prob bilat inflow dzs w/ stable ABIs (R 0.85, L 0.93).   Personal history of tobacco use, presenting hazards to health    Torn ligament    Unspecified schizophrenia, unspecified condition     Past Surgical History:  Procedure Laterality Date   COLONOSCOPY     COLONOSCOPY N/A 11/10/2014   Procedure: COLONOSCOPY;  Surgeon: Manus Gunning, MD;  Location: [place];  Service: Gastroenterology;  Laterality: N/A;   COLONOSCOPY WITH PROPOFOL N/A 05/08/2015   Surgeon: [name], MD; nonthrombosed external hemorrhoids, one 8 mm tubular adenoma, one 4 mm tubular adenoma.  Repeat  colonoscopy in 5-10 years.   ESOPHAGOGASTRODUODENOSCOPY (EGD) WITH PROPOFOL N/A 05/08/2015   Surgeon: [name], MD; LA grade a reflux esophagitis, normal stomach and duodenum.   EYE SURGERY Right    removal of foreign body   HEMORRHOID SURGERY N/A 02/20/2016   Procedure: EXTENSIVE HEMORRHOIDECTOMY;  Surgeon: [name], MD;  Location: [facility];  Service: General;  Laterality: N/A;   None     POLYPECTOMY  05/08/2015   Procedure: POLYPECTOMY;  Surgeon: [name], MD;  Location: [facility];  Service: Endoscopy;;  transverse colon polyp    Current Outpatient Medications  Medication Sig Dispense Refill   amLODipine (NORVASC) 10 MG tablet Take 10 mg by mouth daily.     apixaban (ELIQUIS) 5 MG TABS tablet Take 1 tablet (5 mg total) by mouth 2 (two) times daily. 180 tablet 3   ARIPiprazole (ABILIFY) 20 MG tablet Take 20 mg by mouth daily.     buPROPion (WELLBUTRIN) 100 MG tablet Take 100 mg by mouth 2 (two) times daily.     Cholecalciferol 50 MCG (2000 UT) TABS Take by mouth daily.     COLCRYS 0.6 MG tablet Take 0.6 mg by mouth 2 (two) times daily.     esomeprazole (NEXIUM) 20 MG capsule Take 1 capsule (20 mg total) by mouth daily before breakfast. 30 capsule 3   isosorbide mononitrate (IMDUR) 60 MG 24 hr tablet Take 1 tablet (60 mg total) by mouth every morning. PLEASE CALL OFFICE TO SCHEDULE APPOINTMENT FOR FURTHER REFILLS. THANK YOU! 30 tablet 0   LORazepam (ATIVAN) 2 MG tablet 2 mg. 54m at night     metoprolol (TOPROL XL) 200 MG 24 hr tablet Take 1 tablet (200 mg total) by mouth daily. (Patient taking differently: Take 100 mg by mouth 2 (two) times daily with a meal.) 90 tablet 3   QUEtiapine (SEROQUEL) 300 MG tablet Take 450 mg by mouth at bedtime.     rosuvastatin (CRESTOR) 20 MG tablet Take 1 tablet (20 mg total) by mouth daily. 90 tablet 2   sertraline (ZOLOFT) 100 MG tablet Take 200 mg by mouth daily.     sodium bicarbonate 650 MG tablet Take 650 mg by mouth 2 (two) times  daily.     nitroGLYCERIN (NITROSTAT) 0.4 MG SL tablet Place 1 tablet (0.4 mg total) under the tongue every 5 (five) minutes as needed for chest pain. 25 tablet 3   No current facility-administered medications for this visit.    Allergies as of 11/08/2020 - Review Complete  11/08/2020  Allergen Reaction Noted   Nifedipine Diarrhea 12/08/2013   Rabeprazole Other (See Comments) 02/11/2016   Benazepril Other (See Comments) 03/07/2011   Haloperidol Anxiety 10/21/2017   Haloperidol and related Other (See Comments) 03/07/2011   Omeprazole Other (See Comments) 03/07/2011    Family History  Problem Relation Age of Onset   Crohn's disease Sister    Colon cancer Neg Hx     Social History   Socioeconomic History   Marital status: Divorced    Spouse name: Not on file   Number of children: Not on file   Years of education: Not on file   Highest education level: Not on file  Occupational History   Not on file  Tobacco Use   Smoking status: Former    Packs/day: 1.00    Years: 23.00    Pack years: 23.00    Types: Cigarettes    Quit date: 02/25/1996    Years since quitting: 24.7   Smokeless tobacco: Never   Tobacco comments:    + 15 years of smoking  Vaping Use   Vaping Use: Never used  Substance and Sexual Activity   Alcohol use: No    Comment: Daily alcohol use 20+ years ago   Drug use: Not Currently    Types: Cocaine    Comment: History of intranasal cocaine and speed 20+ years ago.   Sexual activity: Not Currently    Birth control/protection: None  Other Topics Concern   Not on file  Social History Narrative   Not on file   Social Determinants of Health   Financial Resource Strain: Not on file  Food Insecurity: Not on file  Transportation Needs: Not on file  Physical Activity: Not on file  Stress: Not on file  Social Connections: Not on file  Intimate Partner Violence: Not on file    Review of Systems: Gen: Denies any fever, chills, cold or flulike symptoms,  presyncope, syncope. CV: Denies chest pain, heart palpitations. Resp: Denies shortness of breath or cough. GI: See HPI GU : Denies urinary burning, urinary frequency, urinary hesitancy MS: Admits to claudication and feeling unsteady. Derm: Denies rash Psych: Denies confusion/disorientation.  Heme: See HPI  Physical Exam: BP 113/71   Pulse 71   Temp (!) 97.1 F (36.2 C)   Ht 5' 8"  (1.727 m)   Wt 166 lb 12.8 oz (75.7 kg)   BMI 25.36 kg/m  General: Lethargic with unsteady gait, droopy eyes but alert and oriented x4. Well-nourished and well-developed. NAD.  Head:  Normocephalic and atraumatic. Eyes:  Without icterus, sclera clear and conjunctiva pink.  Ears:  Normal auditory acuity. Lungs:  Clear to auscultation bilaterally. No wheezes, rales, or rhonchi. No distress.  Heart:  S1, S2 present without murmurs appreciated.  Abdomen:  +BS, soft, non-tender and non-distended. No HSM noted. No guarding or rebound. No masses appreciated.  Rectal:  Deferred  Msk:  Symmetrical without gross deformities.  Extremities:  Without edema. Neurologic:  Alert and  oriented x4;  grossly normal neurologically. No asterixis.  Skin:  Intact without significant lesions or rashes.. Psych: Flat affect.    Labs:  10/03/2020: Hemoglobin A1c 5.2 Vitamin D 53.2 Creatinine 2.6 (H), uric acid 9 (H), calcium 8.7, glucose 93, urea nitrogen 23, chloride 113 (H), potassium 4.5, sodium 142, total bilirubin 0.4, albumin 3.4 (L), total protein 6.0 (L), ALT 44, AST 45 (H), alk phos 102. Total cholesterol 130, HDL 38, LDL 84, triglycerides 108  10/05/2020: Free T4 0.48 (L) TSH 0.76  PSA 3.32 Transferrin 220 Vitamin B12 491 Iron 67 Iron saturation 22.3% Folate 5.5  10/11/2020: Hemoglobin 11.5 (L), hematocrit 37.6, MCV 96.4, MCH 29.5, MCHC 30.6 (L), platelets 88 (L). AST 48 (H), ALT 49, alk phos 86, total bilirubin 0.3 (L), creatinine 2.6 (H), sodium 141, potassium 4.6, chloride 109, calcium 9.1 Urea nitrogen  45 (H) CRP 0.21 Sed rate 40 (H)  Imaging:  CT CHEST/ABD/PELVIS WO CONTRAST: Exm Date: Sep 28, 2020@16 :66 Req Phys: Newell Coral Loc: 9A/10-05-2020@17 :61 Img Loc: CT RADIOLOGY Service: PSYCHIATRY Reason for Study: r/o malignancy  Clinical History: 65 yo M with h/o schizophrenia and 50 lb unintentional weight loss in 6 months. Also CKD stage 4. Please don't give contrast.   Report: CT of the chest, abdomen, and pelvis without IV contrast   Comparison: comparison   Technique: Noncontrast enhanced CT imaging was performed of the  chest, abdomen, and pelvis were acquired in the axial dimension  with coronal and sagittal reformats. Total exam DLP 618.05  mGy*cm.   Findings: Chest:   Unremarkable thyroid. No axillary lymphadenopathy. Unremarkable  chest wall soft tissues.   Normal heart size. Moderate three-vessel coronary artery  calcifications. Minimal aortic valve calcifications. Normal  caliber thoracic aorta with mild calcifications. Normal caliber  main pulmonary artery. No pericardial effusion.   Normal thoracic esophagus. No mediastinal lymphadenopathy.   The central airways are patent. Bandlike predominately basilar  opacities likely represent scarring or atelectasis. There are  scattered small solid pulmonary nodules measuring less than 6 mm.  Example right upper lobe pulmonary nodule measures 4 mm (5:67).  Mild bibasilar bronchiectasis. No pleural effusion. No  pneumothorax.   Abdomen and pelvis:   Normal contours of the liver, spleen, and right adrenal gland.  1.1 cm indeterminate left adrenal nodule (2:61). Focal area of  calcification within the pancreatic body with possible underlying  hypoattenuating lesion (601:39).   Mild atrophy of the right greater than left kidneys. Multiple  renal cysts No hydronephrosis.   The distal abdominal aorta is markedly narrowed with severe  narrowing of the proximal bilateral common iliac arteries (2:82   through 83). The presumed IMA is hypertrophied/engorged which may  be providing collateral flow to the distal iliac vessels. There  is abnormal soft tissue/stranding associated with the  retroperitoneal space with suggestion of medial deviation of the  bilateral ureters.   No abnormally dilated or thickened loops of large or small bowel.  Normal appendix.   No aggressive appearing osseous lesions.   Impression: Limited noncontrasted study.   1. No definite evidence of malignancy. 2. Indeterminate 1.1 cm left adrenal lesion. Additional possible calcified hypoattenuating pancreatic lesion within the pancreatic body. If the patient's GFR is sufficient, these lesions could be further evaluated with contrast-enhanced abdominal MRI. Alternatively a contrast-enhanced CT adrenal protocol could be obtained for further evaluation. 3. Ill-defined stranding and soft tissue within the retroperitoneum with medial deviation of the proximal ureters suspicious for retroperitoneal fibrosis. 4. The distal infrarenal abdominal aorta and proximal bilateral common iliac arteries are markedly narrowed or occluded. The presumed IMA is enlarged/engorged and is likely providing collateral flow to bilateral iliac system though evaluation of the vascular system is markedly limited without IV contrast. 5. Scattered sub-6 mm pulmonary nodules. If the patient is high risk for lung cancer recommend 1 year follow-up chest CT. If the patient is not high risk for lung cancer a one-year follow-up chest CT is only optional.   I have reviewed the imaging examination and agree with the  findings and conclusions in this report.   Electronically Signed By: Jackqulyn Livings Electronically Signed On: 10/05/2020 5:56 PM     Assessment: 65 year old male with history of CKD, CAD, MI in 2013, ischemic cardiomyopathy, heart failure, PAD with claudication, history of DVT chronically on Eliquis, schizophrenia, reflux  esophagitis, adenomatous colon polyps presenting today at the request of PCP for pancreatic lesion.  Also discussed significant unintentional weight loss, indigestion, abdominal pain, diarrhea, elevated ammonia, mental status changes, vascular abnormalities on outside CT.   Pancreatic lesion: CT A/P without contrast 10/05/2020 at [facility] revealed focal area of calcification within the pancreatic body with possible underlying hypoattenuating lesion. He will need further characterization of this lesion, especially in light of documented 51 pound weight loss over the last year to rule out malignancy.  Reports intermittent postprandial abdominal pain/indigestion possibly related to history of reflux, off PPI, as well as diarrhea discussed below.  In light of CKD, we are unable to perform dedicated pancreatic imaging with CT or MRI.  I will reach out to [facility] for consideration of EUS. *This decision was made after patient left office upon further review of patient's chart and locating CT report in Care Everywhere. I personally updated patient and his sister.   Unintentional weight loss: Documented 51 pound weight loss over the last year.  Likely multifactorial. History of schizophrenia with paranoia, had been seeing maggots/bugs in his food, now resolved. Depression with loss of mother in December 2021 contributing to poor appetite/little interest in food, only eating 1-2 meals per day. Indigestion with intermittent postprandial upper/mid abdominal pain may be playing a role. History of reflux esophagitis on EGD in 2017, currently off daily PPI. Indeterminate pancreatic lesion on CT A/P w/o contrast discussed above with need for further characterization to rule out malignancy. Chest CT at the same time with small pulmonary nodules with plans to follow-up in 1 year.  Also with ongoing diarrhea (discussed below) and low normal TSH 0.76 but mildly low Free T4 at 0.48 on 8/12. He also has history of  adenomatous colon polyps and is due for surveillance colonoscopy.  We will plan to update labs, stool studies, recheck TSH. Will discuss possible EUS for evaluation of pancreatic lesion with [facility]. Patient would benefit from repeat EGD and updating colonoscopy. EGD can likely be completed at the same time as EUS. Pending findings, may need to consider evaluation of mesenteric arteries, though imaging modalities limited in setting of CKD.   Intermittent indigestion/postprandial abdominal pain: Chronic, previously improved with daily PPI, but patient states he has no longer receiving this.  He has history of reflux esophagitis.  We will resume Nexium 20 mg daily.   Diarrhea: 4-5 BMs daily, more frequently loose to watery, occasional nocturnal stools, and accidents at times.  Stool frequency increases the more patient eats. Denies BRBPR or melena.  Patient's twin sister has Crohn's disease, but colonoscopy on file from 2017 only with adenomatous colon polyps.  He does have history of C. difficile in June 2022, and we will need to rule out ongoing infectious diarrhea.  Unclear if recently identified pancreatic lesion (discussed above) may be playing a role- ?malignancy. Will also check for celiac disease, update thyroid function, and arrange surveillance colonoscopy in the near future.  History of adenomatous colon polyps: Due for surveillance colonoscopy.  Last colonoscopy 2017 with one 8 mm tubular adenoma and one 4 mm tubular adenoma.   Elevated ammonia/altered mental status: Mildly elevated ammonia of 38 (7/20), 44 (  8/31) at [facility] when patient presented for changes in mental status. He was given a short course of lactulose, not currently taking.  Patient nor sister report any improvement in mental status with lactulose.  Patient has no known history of liver disease.  CT A/P without contrast on 8/12 at [facility] with normal appearing liver. He does have thrombocytopenia though  spleen was also reported as normal on CT.  Today, patient is lethargic with unsteady gait.  Patient sister reports he is close to baseline, but had similar symptoms when his medications were "messed up".  He is alert and oriented x4 today and has no asterixis.  Query whether polypharmacy may be influencing mental status.  I was able to confirm his medications today once patient returned home. Notably, on 8/31, creatinine was also elevated at 4.76, up from 2.8 in July, with elevated BUN. Query whether this may also be contributing to mental status changes. Will recheck ammonia, kidney function, and electrolytes today.  Patient follows with Dr. Theador Hawthorne for CKD.   Abnormal abdominal aorta and iliac arteries on CT: CT A/P without contrast 10/05/2020 and [facility] noting distal abdominal aorta is markedly narrowed with severe narrowing of the proximal bilateral common iliac arteries.  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  Patient reports chronic claudication.  Unable to pursue CTA/MRA due to CKD.  May benefit from vascular referral, but advised to discuss with cardiology.  I will also send a staff message to cardiology as well.   Plan:  CBC, CMP, TSH, C. difficile GDH and toxin A/B, GI pathogen panel, ammonia, IgA, TTG IgA. Requested CT records/disc with images from [facility].  Resume Nexium 20 mg daily 30 minutes before breakfast.  Prescription sent to pharmacy. We will discussed pancreatic lesion/possible EUS with [facility].  Would benefit from EGD.  Will discuss with [facility] as this may be able to be completed at the same time as EUS. Otherwise, can arrange with Dr. Abbey Chatters.  Colonoscopy with propofol with Dr. Abbey Chatters in the near future. The risks, benefits, and alternatives have been discussed with the patient in detail. The patient states understanding and desires to proceed. ASA III Will need to get cardiac  clearance and okay to hold Eliquis x48 hours. UDS at preop Patient encouraged to reach out to cardiology to follow-up on aortic/iliac artery narrowing/?  occlusion.  I will also send message to cardiology.  Likely needs further imaging for possible vascular consult. Follow-up after procedures.     Aliene Altes, PA-C [facility] 11/08/2020

## 2020-11-08 ENCOUNTER — Encounter: Payer: Self-pay | Admitting: Gastroenterology

## 2020-11-08 ENCOUNTER — Other Ambulatory Visit: Payer: Self-pay

## 2020-11-08 ENCOUNTER — Ambulatory Visit (INDEPENDENT_AMBULATORY_CARE_PROVIDER_SITE_OTHER): Payer: Medicare Other | Admitting: Gastroenterology

## 2020-11-08 VITALS — BP 113/71 | HR 71 | Temp 97.1°F | Ht 68.0 in | Wt 166.8 lb

## 2020-11-08 DIAGNOSIS — R634 Abnormal weight loss: Secondary | ICD-10-CM | POA: Diagnosis not present

## 2020-11-08 DIAGNOSIS — K869 Disease of pancreas, unspecified: Secondary | ICD-10-CM | POA: Diagnosis not present

## 2020-11-08 DIAGNOSIS — R197 Diarrhea, unspecified: Secondary | ICD-10-CM | POA: Diagnosis not present

## 2020-11-08 DIAGNOSIS — K30 Functional dyspepsia: Secondary | ICD-10-CM | POA: Diagnosis not present

## 2020-11-08 DIAGNOSIS — R4182 Altered mental status, unspecified: Secondary | ICD-10-CM | POA: Diagnosis not present

## 2020-11-08 DIAGNOSIS — R7989 Other specified abnormal findings of blood chemistry: Secondary | ICD-10-CM

## 2020-11-08 DIAGNOSIS — Q254 Congenital malformation of aorta unspecified: Secondary | ICD-10-CM | POA: Diagnosis not present

## 2020-11-08 DIAGNOSIS — R109 Unspecified abdominal pain: Secondary | ICD-10-CM | POA: Insufficient documentation

## 2020-11-08 MED ORDER — ESOMEPRAZOLE MAGNESIUM 20 MG PO CPDR: 20.0000 mg | DELAYED_RELEASE_CAPSULE | Freq: Every day | ORAL | 3 refills | Status: AC

## 2020-11-08 NOTE — Patient Instructions (Addendum)
Please call our office when you get home to provide an updated medical list.   Complete blood work and stool studies at [facility]   We are requesting CT results from the [place].  We are also requesting colonoscopy and upper endoscopy reports from the [place].  Resume Nexium 20 mg daily 30 minutes before breakfast for indigestion and abdominal pain.  It is important that you eat 3 meals a day.  You should also drink 2 protein shakes such as boost or ensure daily to help maintain your weight.   We will have further recommendations for you once I have reviewed all of your records.   It was a pleasure meeting you today!   Aliene Altes, PA-C [facility]

## 2020-11-09 ENCOUNTER — Telehealth: Payer: Self-pay | Admitting: Gastroenterology

## 2020-11-09 ENCOUNTER — Other Ambulatory Visit: Payer: Self-pay | Admitting: Gastroenterology

## 2020-11-09 DIAGNOSIS — E876 Hypokalemia: Secondary | ICD-10-CM

## 2020-11-09 DIAGNOSIS — K869 Disease of pancreas, unspecified: Secondary | ICD-10-CM

## 2020-11-09 DIAGNOSIS — R7989 Other specified abnormal findings of blood chemistry: Secondary | ICD-10-CM

## 2020-11-09 LAB — TISSUE TRANSGLUTAMINASE, IGA: (tTG) Ab, IgA: <1 U/mL

## 2020-11-09 LAB — CBC WITH DIFFERENTIAL/PLATELET
Absolute Monocytes: 442 cells/uL (ref 200–950)
Basophils Absolute: 18 cells/uL (ref 0–200)
Basophils Relative: 0.4 %
Eosinophils Absolute: 129 cells/uL (ref 15–500)
Eosinophils Relative: 2.8 %
HCT: 39.2 % (ref 38.5–50.0)
Hemoglobin: 12.3 g/dL — ABNORMAL LOW (ref 13.2–17.1)
Lymphs Abs: 1403 cells/uL (ref 850–3900)
MCH: 29.8 pg (ref 27.0–33.0)
MCHC: 31.4 g/dL — ABNORMAL LOW (ref 32.0–36.0)
MCV: 94.9 fL (ref 80.0–100.0)
MPV: 15 fL — ABNORMAL HIGH (ref 7.5–12.5)
Monocytes Relative: 9.6 %
Neutro Abs: 2608 cells/uL (ref 1500–7800)
Neutrophils Relative %: 56.7 %
Platelets: 92 10*3/uL — ABNORMAL LOW (ref 140–400)
RBC: 4.13 10*6/uL — ABNORMAL LOW (ref 4.20–5.80)
RDW: 14.3 % (ref 11.0–15.0)
Total Lymphocyte: 30.5 %
WBC: 4.6 10*3/uL (ref 3.8–10.8)

## 2020-11-09 LAB — COMPLETE METABOLIC PANEL WITH GFR
AG Ratio: 2.2 (calc) (ref 1.0–2.5)
ALT: 40 U/L (ref 9–46)
AST: 32 U/L (ref 10–35)
Albumin: 4 g/dL (ref 3.6–5.1)
Alkaline phosphatase (APISO): 84 U/L (ref 35–144)
BUN/Creatinine Ratio: 11 (calc) (ref 6–22)
BUN: 42 mg/dL — ABNORMAL HIGH (ref 7–25)
CO2: 13 mmol/L — ABNORMAL LOW (ref 20–32)
Calcium: 8.5 mg/dL — ABNORMAL LOW (ref 8.6–10.3)
Chloride: 117 mmol/L — ABNORMAL HIGH (ref 98–110)
Creat: 3.96 mg/dL — ABNORMAL HIGH (ref 0.70–1.35)
Globulin: 1.8 g/dL (calc) — ABNORMAL LOW (ref 1.9–3.7)
Glucose, Bld: 61 mg/dL — ABNORMAL LOW (ref 65–99)
Potassium: 3.3 mmol/L — ABNORMAL LOW (ref 3.5–5.3)
Sodium: 142 mmol/L (ref 135–146)
Total Bilirubin: 0.4 mg/dL (ref 0.2–1.2)
Total Protein: 5.8 g/dL — ABNORMAL LOW (ref 6.1–8.1)
eGFR: 16 mL/min/{1.73_m2} — ABNORMAL LOW (ref >=60)

## 2020-11-09 LAB — IGA: Immunoglobulin A: 269 mg/dL (ref 70–320)

## 2020-11-09 LAB — TSH: TSH: 1.86 mIU/L (ref 0.40–4.50)

## 2020-11-09 LAB — AMMONIA: Ammonia: 78 umol/L — ABNORMAL HIGH (ref <=72)

## 2020-11-09 MED ORDER — LACTULOSE 10 GM/15ML PO SOLN: 10.0000 g | Freq: Two times a day (BID) | ORAL | 1 refills | Status: DC

## 2020-11-09 MED ORDER — POTASSIUM CHLORIDE CRYS ER 20 MEQ PO TBCR: 20.0000 meq | EXTENDED_RELEASE_TABLET | Freq: Every day | ORAL | 0 refills | Status: DC

## 2020-11-09 NOTE — Telephone Encounter (Signed)
Discussed patient's case with Dr. Rush Landmark who agreed that patient likely needs EUS to further evaluate pancreatic lesion, but stated considering other findings in adrenal region and potential retroperitoneal fibrosis on outside CT, further clarification is ideal.  Recommended MRI/MRI abdomen without contrast (with diffusion imaging-put in comments) to be performed within the [facility] system.  Once MRI is completed, he will review images to determine if EUS is appropriate.   [facility] Clinical Pool: Please arrange MRI abdomen without contrast ASAP at [facility]. Put in comments- With diffusion imaging. No contrast due to CKD.  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

## 2020-11-12 ENCOUNTER — Other Ambulatory Visit: Payer: Self-pay

## 2020-11-12 ENCOUNTER — Telehealth: Payer: Self-pay

## 2020-11-12 DIAGNOSIS — K219 Gastro-esophageal reflux disease without esophagitis: Secondary | ICD-10-CM

## 2020-11-12 DIAGNOSIS — R7989 Other specified abnormal findings of blood chemistry: Secondary | ICD-10-CM

## 2020-11-12 DIAGNOSIS — K869 Disease of pancreas, unspecified: Secondary | ICD-10-CM

## 2020-11-12 NOTE — Addendum Note (Signed)
Addended by: Hassan Rowan on: 11/12/2020 07:48 AM   Modules accepted: Orders

## 2020-11-12 NOTE — Telephone Encounter (Signed)
[facility] Pre-operative Risk Assessment     QUANTAE MARTEL November 17, 1955 [id number]  Procedure: EGD/EUS Anesthesia type:  MAC Procedure Date: Not yet scheduled.  Provider: Dr. Rush Landmark  Type of Clearance needed: Pharmacy  Medication(s) needing held: Xarelto   Length of time for medication to be held: 1-2 days  Please review request and advise by either responding to this message or by sending your response to the fax # provided below.  Thank you,  [place]  Phone: [phone] Fax: [phone] ATTENTION: Adiana Smelcer, LPN

## 2020-11-12 NOTE — Telephone Encounter (Signed)
-----   Message from Irving Copas., MD sent at 11/09/2020  4:29 AM EDT ----- Regarding: RE: Pancreatic Lesion Anson, Thanks for reaching out. I read your note, quite a complex individual. Sounds like he may need an EUS but it would be best to be able to visualize the imaging plus with the other findings in the adrenal region as well as the potential retroperitoneal fibrosis further clarification is probably ideal. I would recommend a more urgent MRI/MRI abdomen without contrast (with diffusion imaging-put in the comments) to be performed and this will not require any contrast. It will be a few weeks before DJ or I will have an EUS slot, but if we can have that imaging completed within the [facility] system, that will help [place] review things before EUS. We will start having our team work on finding an EGD/EUS slot, though it will be a few weeks out. We will reach out to Dr. Tyrell Antonio office to get approval for Xarelto hold. Please forward the MRI results to DJ and myself to review. If the MRI is more concerning or shows no need for EUS then we can just pursue EGD. Thanks.  Covering nurse for Patty, please begin looking for an EGD/EUS linear slot with Dr. Ardis Hughs or myself in the next few weeks (no sooner than 2 weeks so that the [facility] GI team can get the MRI completed).  GM ----- Message ----- From: Roselyn Reef Sent: 11/08/2020   7:31 PM EDT To: Irving Copas., MD, # Subject: Pancreatic Lesion                              Dr. Rush Landmark,   This patient was found to have a focal area of calcification within the pancreatic body with possible underlying hypoattenuating lesion on CT A/P without contrast 10/05/2020 at [facility].  He has had 51 pound weight loss over the last year.  Reports lack of appetite, likely multifactorial.  Also with intermittent postprandial abdominal pain/indigestion which is actually more of a chronic issue with history of reflux  esophagitis in 2017.   We are unable to pursue dedicated pancreatic imaging due to CKD.  Wanted to get your thoughts on pursuing EUS for further evaluation.  I also think patient would benefit from EGD to rule out esophagitis, gastritis, PUD, H. pylori, malignancy in light of reported upper GI symptoms. Was hoping may this could be completed at the same time as EUS.  Otherwise, we can arrange EGD locally as we are also arranging a colonoscopy in the near future.   Thanks,  Aliene Altes, [place] [place] [facility]

## 2020-11-12 NOTE — Telephone Encounter (Signed)
Xarelto hold has been sent via Epic to Dr. Fletcher Anon. Appears pt is scheduled for urgent MRI 11/20/20. Routing this message to Koren Shiver, RN for her to begin scheduling EGD/EUS upon her return to the office

## 2020-11-12 NOTE — Telephone Encounter (Signed)
MRI abdomen w/o contrast scheduled for 11/20/20 at 8:00am, arrive at 7:30am. Scheduler advised pt doesn't have to be NPO because he will be receiving contrast.  Called and informed pt of MRI appt. Letter mailed.

## 2020-11-12 NOTE — Telephone Encounter (Signed)
It seems that he is on Eliquis for DVT indication.  We did not prescribe the medication and we are not managing it.  Please check with primary care physician.

## 2020-11-13 ENCOUNTER — Telehealth: Payer: Self-pay | Admitting: Cardiovascular Disease

## 2020-11-13 NOTE — Telephone Encounter (Signed)
I do not manage his Eliquis.  Check with his primary care physician.

## 2020-11-13 NOTE — Telephone Encounter (Signed)
Chart Review Routing History  Recipients Sent On Sent By Routed Reports   Dr. Woody Seller   11/13/2020  8:59 AM Aleatha Borer, LPN Telephone on 06/03/7351 with Aleatha Borer, LPN      Cover Page Message : Below is the conversation between [facility] and Dr. Fletcher Anon re.Marland KitchenMarland Kitchen

## 2020-11-13 NOTE — Telephone Encounter (Signed)
Faxing this message to Dr. Woody Seller. Will await response.

## 2020-11-13 NOTE — Progress Notes (Signed)
Cc'ed to Dr Theador Hawthorne

## 2020-11-13 NOTE — Telephone Encounter (Signed)
-----   Message from Lamar Laundry, RN sent at 11/13/2020  8:39 AM EDT ----- Regarding: FW: Aortic and iliac abnormalities on CT, claudication Scheduling,  Per Dr. Fletcher Anon, please call this patient to scheduled an appt with Dr. Fletcher Anon as requested.  "Please schedule this patient for a follow-up visit specifically with me in the next few weeks to discuss management of his peripheral arterial disease."    ----- Message ----- From: Wellington Hampshire, MD Sent: 11/12/2020   6:22 PM EDT To: Lamar Laundry, RN, # Subject: RE: Aortic and iliac abnormalities on CT, cl#  I manage his peripheral vascular disease.  I will have him follow-up with me to discuss options.  His options are somewhat limited due to advanced chronic kidney disease.  Lattie Haw, Please schedule this patient for a follow-up visit specifically with me in the next few weeks to discuss management of his peripheral arterial disease.  Thanks.   ----- Message ----- From: Roselyn Reef Sent: 11/08/2020   7:36 PM EDT To: Wellington Hampshire, MD, Erenest Rasher, PA-C Subject: Aortic and iliac abnormalities on CT, claudi#  Dr. Rogue Jury,   This is a mutual patient whom I saw in the office today. Reviewed CT A/P without contrast completed 10/05/2020 at [facility] which noted distal abdominal aorta is markedly narrowed with severe narrowing of the proximal bilateral common iliac arteries.  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  Patient reports chronic claudication.  Unable to pursue CTA/MRA due to CKD. I suspect he may benefit from vascular referral, but wanted to defer to you all. I would be happy to place referral for him if needed.   Thanks,  Aliene Altes, [place] [facility]

## 2020-11-13 NOTE — Telephone Encounter (Signed)
Spoke to patient & is requesting to schedule appt with Arida at his 9/23 appt at checkout

## 2020-11-13 NOTE — Telephone Encounter (Signed)
My apologies. Pt is NOT taking Xarelto but rather Eliquis. Please advise if pt is permitted to hold Eliquis prior to procedure.

## 2020-11-14 ENCOUNTER — Other Ambulatory Visit: Payer: Self-pay

## 2020-11-14 DIAGNOSIS — R809 Proteinuria, unspecified: Secondary | ICD-10-CM | POA: Diagnosis not present

## 2020-11-14 DIAGNOSIS — E872 Acidosis: Secondary | ICD-10-CM | POA: Diagnosis not present

## 2020-11-14 DIAGNOSIS — I129 Hypertensive chronic kidney disease with stage 1 through stage 4 chronic kidney disease, or unspecified chronic kidney disease: Secondary | ICD-10-CM | POA: Diagnosis not present

## 2020-11-14 DIAGNOSIS — D638 Anemia in other chronic diseases classified elsewhere: Secondary | ICD-10-CM | POA: Diagnosis not present

## 2020-11-14 DIAGNOSIS — N184 Chronic kidney disease, stage 4 (severe): Secondary | ICD-10-CM | POA: Diagnosis not present

## 2020-11-14 DIAGNOSIS — K869 Disease of pancreas, unspecified: Secondary | ICD-10-CM

## 2020-11-14 DIAGNOSIS — D696 Thrombocytopenia, unspecified: Secondary | ICD-10-CM | POA: Diagnosis not present

## 2020-11-14 DIAGNOSIS — I5042 Chronic combined systolic (congestive) and diastolic (congestive) heart failure: Secondary | ICD-10-CM | POA: Diagnosis not present

## 2020-11-14 NOTE — Telephone Encounter (Signed)
I spoke with the pt and his wife.  They have been advised of the appt for EUS and well as the anti coag letter sent to Dr Woody Seller.  He will call if he has not heard from our office or the PCP in 1 week.  All information has been mailed to the pt home.

## 2020-11-14 NOTE — Telephone Encounter (Signed)
EGD EUS scheduled for 12/06/20 at 830 am [facility] with DJ Left message on machine to call back

## 2020-11-15 ENCOUNTER — Telehealth: Payer: Self-pay

## 2020-11-15 DIAGNOSIS — K869 Disease of pancreas, unspecified: Secondary | ICD-10-CM | POA: Diagnosis not present

## 2020-11-15 DIAGNOSIS — R197 Diarrhea, unspecified: Secondary | ICD-10-CM | POA: Diagnosis not present

## 2020-11-15 DIAGNOSIS — R7989 Other specified abnormal findings of blood chemistry: Secondary | ICD-10-CM | POA: Diagnosis not present

## 2020-11-15 DIAGNOSIS — R634 Abnormal weight loss: Secondary | ICD-10-CM | POA: Diagnosis not present

## 2020-11-15 DIAGNOSIS — K51419 Inflammatory polyps of colon with unspecified complications: Secondary | ICD-10-CM | POA: Diagnosis not present

## 2020-11-15 DIAGNOSIS — R4182 Altered mental status, unspecified: Secondary | ICD-10-CM | POA: Diagnosis not present

## 2020-11-15 DIAGNOSIS — R109 Unspecified abdominal pain: Secondary | ICD-10-CM | POA: Diagnosis not present

## 2020-11-15 NOTE — Telephone Encounter (Signed)
Message received from Glenn Hawkins, [place]  Lattie Haw, Dr. Fletcher Anon requested this patient be specifically scheduled with him to discuss PAD options.  Can you please have him moved to his schedule?  Spoke with the patient. Patients 11/16/20 appt with Thurmond Butts has been rescheduled with Dr. Fletcher Anon on 11/29/20 @ 10 am with Dr. Fletcher Anon. Patient is made aware of the new appt date and time.

## 2020-11-15 NOTE — Telephone Encounter (Signed)
error 

## 2020-11-15 NOTE — Telephone Encounter (Signed)
Pt's sister Jackelyn Poling called back and the pt had labs done yesterday and turned in stool specimen today.

## 2020-11-15 NOTE — Telephone Encounter (Signed)
Noted. Will wait for results

## 2020-11-16 ENCOUNTER — Ambulatory Visit: Payer: Medicare Other | Admitting: Physician Assistant

## 2020-11-16 LAB — GASTROINTESTINAL PATHOGEN PANEL PCR
C. difficile Tox A/B, PCR: NOT DETECTED
Campylobacter, PCR: NOT DETECTED
Cryptosporidium, PCR: NOT DETECTED
E coli (ETEC) LT/ST PCR: NOT DETECTED
E coli (STEC) stx1/stx2, PCR: NOT DETECTED
E coli 0157, PCR: NOT DETECTED
Giardia lamblia, PCR: NOT DETECTED
Norovirus, PCR: NOT DETECTED
Rotavirus A, PCR: NOT DETECTED
Salmonella, PCR: NOT DETECTED
Shigella, PCR: NOT DETECTED

## 2020-11-16 LAB — C. DIFFICILE GDH AND TOXIN A/B
GDH ANTIGEN: NOT DETECTED
MICRO NUMBER:: 12410873
SPECIMEN QUALITY:: ADEQUATE
TOXIN A AND B: NOT DETECTED

## 2020-11-20 ENCOUNTER — Ambulatory Visit (HOSPITAL_COMMUNITY)
Admission: RE | Admit: 2020-11-20 | Discharge: 2020-11-20 | Disposition: A | Discharge status: Home / Self Care | Payer: Medicare Other | Source: Ambulatory Visit | Attending: Gastroenterology | Admitting: Gastroenterology

## 2020-11-20 ENCOUNTER — Other Ambulatory Visit: Payer: Self-pay | Admitting: Gastroenterology

## 2020-11-20 ENCOUNTER — Other Ambulatory Visit: Payer: Self-pay

## 2020-11-20 DIAGNOSIS — K869 Disease of pancreas, unspecified: Secondary | ICD-10-CM | POA: Diagnosis not present

## 2020-11-20 DIAGNOSIS — N281 Cyst of kidney, acquired: Secondary | ICD-10-CM | POA: Diagnosis not present

## 2020-11-20 DIAGNOSIS — R590 Localized enlarged lymph nodes: Secondary | ICD-10-CM | POA: Diagnosis not present

## 2020-11-20 MED ORDER — GADOBUTROL 1 MMOL/ML IV SOLN
7.0000 mL | Freq: Once | INTRAVENOUS | Status: AC | PRN
  Administered 2020-11-20: 7 mL via INTRAVENOUS

## 2020-11-21 ENCOUNTER — Telehealth: Payer: Self-pay

## 2020-11-21 NOTE — Telephone Encounter (Signed)
-----   Message from Timothy Lasso, RN sent at 11/14/2020  4:04 PM EDT ----- Make sure we have Eliquis hold from Dr Woody Seller

## 2020-11-22 DIAGNOSIS — K869 Disease of pancreas, unspecified: Secondary | ICD-10-CM | POA: Diagnosis not present

## 2020-11-22 DIAGNOSIS — R262 Difficulty in walking, not elsewhere classified: Secondary | ICD-10-CM | POA: Diagnosis not present

## 2020-11-22 DIAGNOSIS — K219 Gastro-esophageal reflux disease without esophagitis: Secondary | ICD-10-CM | POA: Diagnosis not present

## 2020-11-22 DIAGNOSIS — Z9181 History of falling: Secondary | ICD-10-CM | POA: Diagnosis not present

## 2020-11-22 DIAGNOSIS — R531 Weakness: Secondary | ICD-10-CM | POA: Diagnosis not present

## 2020-11-22 DIAGNOSIS — R7989 Other specified abnormal findings of blood chemistry: Secondary | ICD-10-CM | POA: Diagnosis not present

## 2020-11-22 NOTE — Telephone Encounter (Signed)
Cyril Mourning, I tried to reach the pt by phone to make him aware of the cancellation.  No answer and voice mail is full.

## 2020-11-22 NOTE — Telephone Encounter (Signed)
The appt has been cancelled as requested.

## 2020-11-22 NOTE — Telephone Encounter (Signed)
noted 

## 2020-11-22 NOTE — Telephone Encounter (Signed)
Milus Banister, MD  Mansouraty, Telford Nab., MD; Erenest Rasher, PA-C; Timothy Lasso, RN Cyril Mourning,  Given the MRI shows a normal pancreas I am going to cancel the EUS that is scheduled for 2 weeks from now.  Those appt are in very high demand and since she does not need it her spot will be offered to another patient.  I trust the [facility] can take care of her needs.  Thanks   Lucrecia Mcphearson,  See above.  Please cancel the EUS and offer to whoever is on our list, either yet to be scheduled or offer this earlier appointment to patients later in the month.     Thanks all.   Radonna Ricker

## 2020-11-22 NOTE — Telephone Encounter (Signed)
Thanks for letting me know Patty.  We we will be reaching out to the patient soon to get him arranged for an EGD and colonoscopy with our office.   [facility] Clinical Pool:  Arrange TCS + EGD with propofol with Dr. Abbey Chatters. Dx: Unintentional weight loss, indigestion, abdominal pain, diarrhea, history of adenomatous colon polyps. ASA III/IV Will need okay from PCP to hold Eliquis x48 hours.   Dena: We need to reach out to patient's PCP to get the okay to hold Eliquis x48 hours for EGD and colonoscopy.

## 2020-11-22 NOTE — Telephone Encounter (Signed)
Pt sister called asking if pt was scheduled yet. Advised looks like we are needing okay to hold eliquis prior and then will get scheduled. She advised to call Jackelyn Poling (sister) at [phone] to schedule.

## 2020-11-22 NOTE — Telephone Encounter (Signed)
Glenn Hawkins the pt returned our call and he has been advised that the appt has been cancelled. He will be calling your office for next steps.

## 2020-11-23 ENCOUNTER — Other Ambulatory Visit: Payer: Self-pay

## 2020-11-23 DIAGNOSIS — I10 Essential (primary) hypertension: Secondary | ICD-10-CM

## 2020-11-23 DIAGNOSIS — I5022 Chronic systolic (congestive) heart failure: Secondary | ICD-10-CM

## 2020-11-23 LAB — BASIC METABOLIC PANEL WITH GFR
BUN/Creatinine Ratio: 8 (calc) (ref 6–22)
BUN: 37 mg/dL — ABNORMAL HIGH (ref 7–25)
CO2: 17 mmol/L — ABNORMAL LOW (ref 20–32)
Calcium: 8.1 mg/dL — ABNORMAL LOW (ref 8.6–10.3)
Chloride: 117 mmol/L — ABNORMAL HIGH (ref 98–110)
Creat: 4.47 mg/dL — ABNORMAL HIGH (ref 0.70–1.35)
Glucose, Bld: 72 mg/dL (ref 65–99)
Potassium: 3.3 mmol/L — ABNORMAL LOW (ref 3.5–5.3)
Sodium: 142 mmol/L (ref 135–146)
eGFR: 14 mL/min/{1.73_m2} — ABNORMAL LOW (ref >=60)

## 2020-11-23 LAB — AMMONIA: Ammonia: 63 umol/L (ref <=72)

## 2020-11-26 ENCOUNTER — Encounter: Payer: Self-pay | Admitting: *Deleted

## 2020-11-26 ENCOUNTER — Other Ambulatory Visit: Payer: Self-pay | Admitting: Gastroenterology

## 2020-11-26 ENCOUNTER — Other Ambulatory Visit: Payer: Self-pay | Admitting: *Deleted

## 2020-11-26 DIAGNOSIS — E876 Hypokalemia: Secondary | ICD-10-CM

## 2020-11-26 NOTE — Telephone Encounter (Signed)
Sent a letter to Dr Jerene Bears and pre-op on Friday. Waiting on a response.

## 2020-11-26 NOTE — Patient Outreach (Signed)
[place] [facility]) Care Management  11/26/2020  Glenn Hawkins 02-06-1956 [id number]  Referred from [facility] for nursing care management.  Talked with Glenn Hawkins today. He have permission for intake and consent to participate in [place] [facility] program.  Past Medical History:  Diagnosis Date   Aortic insufficiency    a. 08/2019 Echo: mild to mod AI.   C. difficile diarrhea 07/2020   CKD (chronic kidney disease) stage 3, GFR 30-59 ml/min (HCC)    baseline Cr around 2.5   Coronary atherosclerosis of native coronary artery    a. 02/2011 Late presentation MI-->Myoview w/ large area of transmural infarct in LCX distribution, no ischemia.   Depressive disorder, not elsewhere classified    Esophageal reflux    Gout, unspecified    Hemorrhoids    a. 01/2016 s/p hemorrhoidectomy.   HFrEF (heart failure with reduced ejection fraction) ([place])    a. 04/2012 Echo: EF 40%, inflat AK, Gr1 DD, mild AI/MR, triv TR; b. 11/2017 EchoP EF 30-35%, inflat HK; c. 08/2019 Echo: EF 30-35%, gr1 DD, inflat AK.   History of DVT (deep vein thrombosis)    a. Chronic Eliquis.   Hyperlipidemia LDL goal <70    Hypertension    Hypotension, unspecified    Ischemic cardiomyopathy    a. 04/2012 Echo: EF 40%, inflat AK, Gr1 DD, mild AI/MR, triv TR; b. 11/2017 EchoP EF 30-35%, inflat HK; c. 08/2019 Echo: EF 30-35%, gr1 DD, inflat AK. Nl RV size/fxn. Mild MR. Mild to mod AI.   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 04/2012 Echo: EF 40%, mild MR.   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 02/2011 - late presentation. Managed conservatively 2/2 CKD;  b. 02/2011 Myoview: Large transmural infarct in the LCX distribution, no ischemia.   PAD (peripheral artery disease) ([place])    a. 08/2015 ABI/duplex: R: 0.86, L 0.96. Duplex w/ bilateral heterogeneous plaque in mid fem arteries, no significant stenoses; b. 08/2019 ABI/Duplex: prob bilat inflow dzs w/ stable ABIs (R 0.85, L 0.93).   Personal history of tobacco use,  presenting hazards to health    Torn ligament    Unspecified schizophrenia, unspecified condition     Current Meds  Medication Sig   amLODipine (NORVASC) 10 MG tablet Take 10 mg by mouth daily.   apixaban (ELIQUIS) 5 MG TABS tablet Take 1 tablet (5 mg total) by mouth 2 (two) times daily.   ARIPiprazole (ABILIFY) 20 MG tablet Take 20 mg by mouth daily.   buPROPion (WELLBUTRIN) 100 MG tablet Take 100 mg by mouth 2 (two) times daily.   Cholecalciferol 50 MCG (2000 UT) TABS Take by mouth daily.   COLCRYS 0.6 MG tablet Take 0.6 mg by mouth 2 (two) times daily.   esomeprazole (NEXIUM) 20 MG capsule Take 1 capsule (20 mg total) by mouth daily before breakfast.   isosorbide mononitrate (IMDUR) 60 MG 24 hr tablet Take 1 tablet (60 mg total) by mouth every morning. PLEASE CALL OFFICE TO SCHEDULE APPOINTMENT FOR FURTHER REFILLS. THANK YOU!   lactulose (CHRONULAC) 10 GM/15ML solution Take 15 mLs (10 g total) by mouth 2 (two) times daily.   LORazepam (ATIVAN) 2 MG tablet 2 mg. 2mg  at night   metoprolol (TOPROL XL) 200 MG 24 hr tablet Take 1 tablet (200 mg total) by mouth daily. (Patient taking differently: Take 100 mg by mouth 2 (two) times daily with a meal.)   QUEtiapine (SEROQUEL) 300 MG tablet Take 450 mg by mouth at bedtime.  Pt does not have Sodium Bicarbonate. Called Dr. Theador Hawthorne, ordering MD to request new Rx and also to alert him about pt's dwindling kidney function, Creatinie 4.+ and GFR <14.[name]  Pt unable to complete assessment at this time.   We scheduled another call for next Monday.  Glenn Hawkins. Glenn Neither, MSN, [facility] Gerontological Nurse Practitioner [place] [facility] [phone]

## 2020-11-27 NOTE — Telephone Encounter (Signed)
error 

## 2020-11-27 NOTE — Telephone Encounter (Signed)
[facility] Clinical  Nothing on this pt yet. I seen where he had procedures set up with a Dr. Milus Banister and it has been cancelled. I will follow up Thursday if nothing has surfaced yet

## 2020-11-28 ENCOUNTER — Telehealth: Payer: Self-pay

## 2020-11-28 ENCOUNTER — Encounter: Payer: Self-pay | Admitting: *Deleted

## 2020-11-28 NOTE — Telephone Encounter (Signed)
Phoned to [facility] and spoke with Dr. Jerene Bears nurse (who advised they just got it yesterday but it came back with confirmation where it was faxed on 11/23/2020), Dr. Woody Seller has cleared for the pt to hold his Eliquis  x 48 hours prior to procedures.

## 2020-11-28 NOTE — Telephone Encounter (Signed)
Spoke with pt and sister Anne Ng. Procedure scheduled for 10/24 at 8:30am. Aware will need pre-op appt prior. She will come by office and pick up prep sample and instrucitons.

## 2020-11-28 NOTE — Telephone Encounter (Signed)
Sent medication clearance to [facility] Clinical to schedule for procedures.

## 2020-11-28 NOTE — Telephone Encounter (Signed)
Pt came by office and picked up prep. Pre-op appt 12/13/20 at 9:00am. Pre-op appt, instructions, and prep given to pt.

## 2020-11-29 ENCOUNTER — Other Ambulatory Visit: Payer: Self-pay

## 2020-11-29 ENCOUNTER — Ambulatory Visit (INDEPENDENT_AMBULATORY_CARE_PROVIDER_SITE_OTHER): Payer: Medicare Other | Admitting: Cardiovascular Disease

## 2020-11-29 VITALS — BP 110/60 | HR 62 | Ht 68.0 in | Wt 184.5 lb

## 2020-11-29 DIAGNOSIS — I5022 Chronic systolic (congestive) heart failure: Secondary | ICD-10-CM | POA: Diagnosis not present

## 2020-11-29 DIAGNOSIS — I2510 Atherosclerotic heart disease of native coronary artery without angina pectoris: Secondary | ICD-10-CM

## 2020-11-29 DIAGNOSIS — D696 Thrombocytopenia, unspecified: Secondary | ICD-10-CM

## 2020-11-29 DIAGNOSIS — I739 Peripheral vascular disease, unspecified: Secondary | ICD-10-CM | POA: Diagnosis not present

## 2020-11-29 DIAGNOSIS — E785 Hyperlipidemia, unspecified: Secondary | ICD-10-CM | POA: Diagnosis not present

## 2020-11-29 DIAGNOSIS — I10 Essential (primary) hypertension: Secondary | ICD-10-CM

## 2020-11-29 DIAGNOSIS — Z86718 Personal history of other venous thrombosis and embolism: Secondary | ICD-10-CM | POA: Diagnosis not present

## 2020-11-29 DIAGNOSIS — R7989 Other specified abnormal findings of blood chemistry: Secondary | ICD-10-CM

## 2020-11-29 NOTE — Patient Instructions (Signed)
Medication Instructions:  Your physician recommends that you continue on your current medications as directed. Please refer to the Current Medication list given to you today.  *If you need a refill on your cardiac medications before your next appointment, please call your pharmacy*   Lab Work: None ordered If you have labs (blood work) drawn today and your tests are completely normal, you will receive your results only by: MyChart Message (if you have MyChart) OR A paper copy in the mail If you have any lab test that is abnormal or we need to change your treatment, we will call you to review the results.   Testing/Procedures: None ordered   Follow-Up: At [facility], you and your health needs are our priority.  As part of our continuing mission to provide you with exceptional heart care, we have created designated Provider Care Teams.  These Care Teams include your primary Cardiologist (physician) and Advanced Practice Providers (APPs -  Physician Assistants and Nurse Practitioners) who all work together to provide you with the care you need, when you need it.  We recommend signing up for the patient portal called "MyChart".  Sign up information is provided on this After Visit Summary.  MyChart is used to connect with patients for Virtual Visits (Telemedicine).  Patients are able to view lab/test results, encounter notes, upcoming appointments, etc.  Non-urgent messages can be sent to your provider as well.   To learn more about what you can do with MyChart, go to https://www.mychart.com.    Your next appointment:   Your physician wants you to follow-up in: 6 months You will receive a reminder letter in the mail two months in advance. If you don't receive a letter, please call our office to schedule the follow-up appointment.   The format for your next appointment:   In Person  Provider:   You may see Muhammad Arida, MD or one of the following Advanced Practice Providers on your  designated Care Team:   Christopher Berge, NP Ryan Dunn, PA-C Jacquelyn Visser, PA-C Cadence Furth, PA-C   Other Instructions N/A  

## 2020-11-29 NOTE — Progress Notes (Signed)
Cardiology Office Note   Date:  11/29/2020   ID:  Glenn Hawkins, DOB 18-Mar-1955, MRN [id number]  PCP:  Glenda Chroman, MD  Cardiologist:   Kathlyn Sacramento, MD   Chief Complaint  Patient presents with   Other    PV f/u no complaints today. Meds reviewed verbally with pt.       History of Present Illness: Glenn Hawkins is a 65 y.o. male who presents for a followup visit. He has known history of coronary artery disease status post myocardial infarction with late presentation. He is being treated medically and did not have cardiac catheterization done due to chronic kidney disease (creatinine around 2.5) and late presentation. He had a nuclear stress test in January of 2013 which showed mostly a large transmural infarct in the left circumflex distribution without significant ischemia. EF was 40-45% with moderate mitral regurgitation.  He had extensive right leg DVT in August 2016 and has been on anticoagulation since then. He also has  bilateral calf claudication due to suspected inflow disease.  Most recent echocardiogram in July 2021 showed an EF of 30 to 35% with akinesis of the inferolateral wall, mild mitral regurgitation and mild to moderate aortic regurgitation. He complained of some worsening of bilateral leg pain. Noninvasive vascular studies were repeated and showed stable ABI in the 0.85 range. Duplex showed no significant infrainguinal disease.  He has been followed by GI due to abdominal pain.  He underwent MRI of the abdomen which showed no acute findings. He underwent [facility] at the [facility] which showed incidental finding of significant distal abdominal aortic narrowing extending into bilateral common iliac arteries.  The IMA was noted to be hypertrophied providing collateral.  The patient has progressive chronic kidney disease with most recent creatinine of 4.47.  Estimated GFR 14. He reports improved appetite overall and he gained some of the weight back.  He reports stable  bilateral calf claudication which does not seem to be lifestyle limiting at this point.  No chest pain or shortness of breath.  Past Medical History:  Diagnosis Date   Aortic insufficiency    a. 08/2019 Echo: mild to mod AI.   C. difficile diarrhea 07/2020   CKD (chronic kidney disease) stage 3, GFR 30-59 ml/min (HCC)    baseline Cr around 2.5   Coronary atherosclerosis of native coronary artery    a. 02/2011 Late presentation MI-->Myoview w/ large area of transmural infarct in LCX distribution, no ischemia.   Depressive disorder, not elsewhere classified    Esophageal reflux    Gout, unspecified    Hemorrhoids    a. 01/2016 s/p hemorrhoidectomy.   HFrEF (heart failure with reduced ejection fraction) ([place])    a. 04/2012 Echo: EF 40%, inflat AK, Gr1 DD, mild AI/MR, triv TR; b. 11/2017 EchoP EF 30-35%, inflat HK; c. 08/2019 Echo: EF 30-35%, gr1 DD, inflat AK.   History of DVT (deep vein thrombosis)    a. Chronic Eliquis.   Hyperlipidemia LDL goal <70    Hypertension    Hypotension, unspecified    Ischemic cardiomyopathy    a. 04/2012 Echo: EF 40%, inflat AK, Gr1 DD, mild AI/MR, triv TR; b. 11/2017 EchoP EF 30-35%, inflat HK; c. 08/2019 Echo: EF 30-35%, gr1 DD, inflat AK. Nl RV size/fxn. Mild MR. Mild to mod AI.   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 04/2012 Echo: EF 40%, mild MR.   Myocardial infarction (lateral wall) ([place]) 2013   a. 02/2011 - late  presentation. Managed conservatively 2/2 CKD;  b. 02/2011 Myoview: Large transmural infarct in the LCX distribution, no ischemia.   PAD (peripheral artery disease) ([place])    a. 08/2015 ABI/duplex: R: 0.86, L 0.96. Duplex w/ bilateral heterogeneous plaque in mid fem arteries, no significant stenoses; b. 08/2019 ABI/Duplex: prob bilat inflow dzs w/ stable ABIs (R 0.85, L 0.93).   Personal history of tobacco use, presenting hazards to health    Torn ligament    Unspecified schizophrenia, unspecified condition     Past Surgical History:  Procedure  Laterality Date   COLONOSCOPY     COLONOSCOPY N/A 11/10/2014   Procedure: COLONOSCOPY;  Surgeon: Manus Gunning, MD;  Location: [facility];  Service: Gastroenterology;  Laterality: N/A;   COLONOSCOPY WITH PROPOFOL N/A 05/08/2015   Surgeon: [name], MD; nonthrombosed external hemorrhoids, one 8 mm tubular adenoma, one 4 mm tubular adenoma.  Repeat colonoscopy in 5-10 years.   ESOPHAGOGASTRODUODENOSCOPY (EGD) WITH PROPOFOL N/A 05/08/2015   Surgeon: [name], MD; LA grade a reflux esophagitis, normal stomach and duodenum.   EYE SURGERY Right    removal of foreign body   HEMORRHOID SURGERY N/A 02/20/2016   Procedure: EXTENSIVE HEMORRHOIDECTOMY;  Surgeon: [name], MD;  Location: [facility];  Service: General;  Laterality: N/A;   None     POLYPECTOMY  05/08/2015   Procedure: POLYPECTOMY;  Surgeon: [name], MD;  Location: [facility];  Service: Endoscopy;;  transverse colon polyp     Current Outpatient Medications  Medication Sig Dispense Refill   amLODipine (NORVASC) 10 MG tablet Take 10 mg by mouth daily.     apixaban (ELIQUIS) 5 MG TABS tablet Take 1 tablet (5 mg total) by mouth 2 (two) times daily. 180 tablet 3   ARIPiprazole (ABILIFY) 20 MG tablet Take 20 mg by mouth daily.     buPROPion (WELLBUTRIN) 100 MG tablet Take 100 mg by mouth 2 (two) times daily.     Cholecalciferol 50 MCG (2000 UT) TABS Take by mouth daily.     COLCRYS 0.6 MG tablet Take 0.6 mg by mouth 2 (two) times daily.     esomeprazole (NEXIUM) 20 MG capsule Take 1 capsule (20 mg total) by mouth daily before breakfast. 30 capsule 3   isosorbide mononitrate (IMDUR) 60 MG 24 hr tablet Take 1 tablet (60 mg total) by mouth every morning. PLEASE CALL OFFICE TO SCHEDULE APPOINTMENT FOR FURTHER REFILLS. THANK YOU! 30 tablet 0   lactulose (CHRONULAC) 10 GM/15ML solution Take 15 mLs (10 g total) by mouth 2 (two) times daily. 946 mL 1   LORazepam (ATIVAN) 2 MG tablet 2 mg. 2mg  at night     metoprolol  (TOPROL XL) 200 MG 24 hr tablet Take 1 tablet (200 mg total) by mouth daily. (Patient taking differently: Take 100 mg by mouth 2 (two) times daily with a meal.) 90 tablet 3   nitroGLYCERIN (NITROSTAT) 0.4 MG SL tablet Place 1 tablet (0.4 mg total) under the tongue every 5 (five) minutes as needed for chest pain. 25 tablet 3   QUEtiapine (SEROQUEL) 300 MG tablet Take 450 mg by mouth daily.     sertraline (ZOLOFT) 100 MG tablet Take 200 mg by mouth daily.     sodium bicarbonate 650 MG tablet Take 650 mg by mouth 2 (two) times daily.     rosuvastatin (CRESTOR) 20 MG tablet Take 1 tablet (20 mg total) by mouth daily. 90 tablet 2   No current facility-administered medications for this  visit.    Allergies:   Nifedipine, Rabeprazole, Benazepril, Haloperidol, Haloperidol and related, and Omeprazole    Social History:  The patient  reports that he quit smoking about 24 years ago. His smoking use included cigarettes. He has a 23.00 pack-year smoking history. He has never used smokeless tobacco. He reports that he does not currently use drugs after having used the following drugs: Cocaine. He reports that he does not drink alcohol.   Family History:  The patient's family history includes Crohn's disease in his sister.    ROS:  Please see the history of present illness.   Otherwise, review of systems are positive for none.   All other systems are reviewed and negative.    PHYSICAL EXAM: VS:  BP 110/60 (BP Location: Left Arm, Patient Position: Sitting, Cuff Size: Normal)   Pulse 62   Ht 5\' 8"  (1.727 m)   Wt 184 lb 8 oz (83.7 kg)   SpO2 98%   BMI 28.05 kg/m  , BMI Body mass index is 28.05 kg/m. GEN: Well nourished, well developed, in no acute distress  HEENT: normal  Neck: no JVD, carotid bruits, or masses Cardiac: RRR; no murmurs, rubs, or gallops,no edema  Respiratory:  clear to auscultation bilaterally, normal work of breathing GI: soft, nontender, nondistended, + BS MS: no deformity or  atrophy  Skin: warm and dry, no rash Neuro:  Strength and sensation are intact Psych: euthymic mood, full affect Vascular: Femoral pulses barely palpable on the right and +1 on the left.  Distal pulses are diminished.   EKG:  EKG is ordered today. The ekg ordered today demonstrates normal sinus rhythm with nonspecific ST changes.   Recent Labs: 11/08/2020: ALT 40; Hemoglobin 12.3; Platelets 92; TSH 1.86 11/22/2020: BUN 37; Creat 4.47; Potassium 3.3; Sodium 142    Lipid Panel    Component Value Date/Time   CHOL 191 08/02/2019 1515   TRIG 175 (H) 08/02/2019 1515   HDL 37 (L) 08/02/2019 1515   CHOLHDL 5.2 (H) 08/02/2019 1515   CHOLHDL 4.2 11/09/2014 0525   VLDL 23 11/09/2014 0525   LDLCALC 123 (H) 08/02/2019 1515   LDLDIRECT 125 (H) 08/02/2019 1515      Wt Readings from Last 3 Encounters:  11/29/20 184 lb 8 oz (83.7 kg)  11/08/20 166 lb 12.8 oz (75.7 kg)  08/01/20 189 lb (85.7 kg)       ASSESSMENT AND PLAN:  1.  Coronary artery disease involving native coronary arteries without angina: He continues to be clinically stable with no new symptoms.  Continue medical therapy.  2. Chronic systolic heart failure: Most recent ejection fraction was 30 to 35%.  His symptoms are stable.  Continue Toprol.   He is not on ACE inhibitor or ARB due to advanced chronic kidney disease.   Most recent GFR was 15.  Spironolactone was discontinued for the same reason.  We could consider switching amlodipine to hydralazine to be used with Imdur.    3. Essential hypertension: Blood pressure is reasonably controlled on current medications.  4. History of right leg DVT: Currently on anticoagulation with Eliquis.  5. Hyperlipidemia: Recent labs showed an LDL of 123. I agree with switching simvastatin to rosuvastatin.  6. Peripheral arterial disease: The patient likely has severe aortoiliac disease but he reports stable mild bilateral calf claudication which is currently not lifestyle limiting.  I  discussed with him the option of endovascular intervention however, there is significant risk of contrast-induced nephropathy given GFR of 15.  Thus, given stability of symptoms, I recommend continuing medical therapy for now.  7.  Erectile dysfunction: The patient has if he can use a phosphodiesterase inhibitor like sildenafil.  I explained to him that we will have to stop Imdur in order to be able to do that.  In addition, I do not think he will benefit much from these medications given that his erectile dysfunction is likely due to significant peripheral arterial disease.  We elected to keep everything the same for now.     Disposition:   FU with me in 6 months  Signed,  Kathlyn Sacramento, MD  11/29/2020 10:23 AM    [place]

## 2020-11-30 ENCOUNTER — Ambulatory Visit: Payer: Medicare Other | Admitting: Gastroenterology

## 2020-11-30 ENCOUNTER — Other Ambulatory Visit: Payer: Self-pay

## 2020-11-30 DIAGNOSIS — R7989 Other specified abnormal findings of blood chemistry: Secondary | ICD-10-CM

## 2020-11-30 DIAGNOSIS — D696 Thrombocytopenia, unspecified: Secondary | ICD-10-CM

## 2020-12-03 ENCOUNTER — Other Ambulatory Visit: Payer: Self-pay | Admitting: *Deleted

## 2020-12-03 DIAGNOSIS — D696 Thrombocytopenia, unspecified: Secondary | ICD-10-CM | POA: Diagnosis not present

## 2020-12-03 DIAGNOSIS — N189 Chronic kidney disease, unspecified: Secondary | ICD-10-CM | POA: Diagnosis not present

## 2020-12-03 DIAGNOSIS — I739 Peripheral vascular disease, unspecified: Secondary | ICD-10-CM | POA: Diagnosis not present

## 2020-12-03 DIAGNOSIS — R809 Proteinuria, unspecified: Secondary | ICD-10-CM | POA: Diagnosis not present

## 2020-12-03 DIAGNOSIS — I5042 Chronic combined systolic (congestive) and diastolic (congestive) heart failure: Secondary | ICD-10-CM | POA: Diagnosis not present

## 2020-12-03 DIAGNOSIS — D638 Anemia in other chronic diseases classified elsewhere: Secondary | ICD-10-CM | POA: Diagnosis not present

## 2020-12-03 DIAGNOSIS — I1310 Hypertensive heart and chronic kidney disease without heart failure, with stage 1 through stage 4 chronic kidney disease, or unspecified chronic kidney disease: Secondary | ICD-10-CM | POA: Diagnosis not present

## 2020-12-03 DIAGNOSIS — E8722 Chronic metabolic acidosis: Secondary | ICD-10-CM | POA: Diagnosis not present

## 2020-12-03 DIAGNOSIS — E876 Hypokalemia: Secondary | ICD-10-CM | POA: Diagnosis not present

## 2020-12-03 DIAGNOSIS — N184 Chronic kidney disease, stage 4 (severe): Secondary | ICD-10-CM | POA: Diagnosis not present

## 2020-12-03 DIAGNOSIS — N170 Acute kidney failure with tubular necrosis: Secondary | ICD-10-CM | POA: Diagnosis not present

## 2020-12-03 DIAGNOSIS — I129 Hypertensive chronic kidney disease with stage 1 through stage 4 chronic kidney disease, or unspecified chronic kidney disease: Secondary | ICD-10-CM | POA: Diagnosis not present

## 2020-12-03 NOTE — Patient Outreach (Signed)
[facility]) Care Management  12/03/2020  Glenn Hawkins 09-17-1955 [id number]  Telephone outreach to complete initial assessment. Phone went unanswered, left a message to return my call.  Glenn Hawkins called back. He said he was not feeling good about agreeing to participate in care management.  Reviewed who [facility] is and advised that Dr. Woody Seller is apart of [facility]. Also advised that the program is voluntary. Will send a welcome letter with enclosures now. Pt to advise NP if he wants to participate. If NP does not hear from pt, NP will call him in 2 weeks.  [name]. [name] Neither, MSN, [facility] Gerontological Nurse Practitioner [facility] Care Management [phone]

## 2020-12-04 NOTE — Addendum Note (Signed)
Addended by: Britt Bottom on: 12/04/2020 04:14 PM   Modules accepted: Orders

## 2020-12-05 ENCOUNTER — Telehealth: Payer: Self-pay | Admitting: Internal Medicine

## 2020-12-05 DIAGNOSIS — R262 Difficulty in walking, not elsewhere classified: Secondary | ICD-10-CM | POA: Diagnosis not present

## 2020-12-05 DIAGNOSIS — Z9181 History of falling: Secondary | ICD-10-CM | POA: Diagnosis not present

## 2020-12-05 DIAGNOSIS — R531 Weakness: Secondary | ICD-10-CM | POA: Diagnosis not present

## 2020-12-05 NOTE — Telephone Encounter (Signed)
Please advise Cyril Mourning

## 2020-12-05 NOTE — Telephone Encounter (Signed)
I think they are getting confused. He has several things going on right now. He doesn't need an EUS as the MRI did not reveal any concerning pancreatic lesions. However, he does need the elastography to take a second look at his liver.

## 2020-12-05 NOTE — Telephone Encounter (Signed)
LMOVM to call back 

## 2020-12-05 NOTE — Telephone Encounter (Signed)
[phone] please call patient sister. They received a letter about his ultrasound and they were under the impression that kristen did not think he needed one done.

## 2020-12-06 ENCOUNTER — Ambulatory Visit (HOSPITAL_COMMUNITY): Admit: 2020-12-06 | Payer: Medicare Other | Admitting: Gastroenterology

## 2020-12-06 ENCOUNTER — Encounter (HOSPITAL_COMMUNITY): Payer: Self-pay

## 2020-12-06 DIAGNOSIS — R262 Difficulty in walking, not elsewhere classified: Secondary | ICD-10-CM | POA: Diagnosis not present

## 2020-12-06 DIAGNOSIS — R531 Weakness: Secondary | ICD-10-CM | POA: Diagnosis not present

## 2020-12-06 DIAGNOSIS — Z9181 History of falling: Secondary | ICD-10-CM | POA: Diagnosis not present

## 2020-12-06 SURGERY — ESOPHAGOGASTRODUODENOSCOPY (EGD) WITH PROPOFOL
Anesthesia: Monitor Anesthesia Care

## 2020-12-06 NOTE — Telephone Encounter (Signed)
Called pt sister and she still was not understanding. They are still very confused. Cyril Mourning can you call sister?  She can be reached at [phone] (annette) Thanks!

## 2020-12-06 NOTE — Telephone Encounter (Signed)
Spoke with patient's sister, Anne Ng. Explained reasoning for elastography. She has no further questions or concerns about. She is aware it is scheduled on 10/18 and has received the instructions in the mail.

## 2020-12-11 ENCOUNTER — Ambulatory Visit (HOSPITAL_COMMUNITY)
Admission: RE | Admit: 2020-12-11 | Discharge: 2020-12-11 | Disposition: A | Discharge status: Home / Self Care | Payer: Medicare Other | Source: Ambulatory Visit | Attending: Gastroenterology | Admitting: Gastroenterology

## 2020-12-11 ENCOUNTER — Other Ambulatory Visit: Payer: Self-pay

## 2020-12-11 DIAGNOSIS — D696 Thrombocytopenia, unspecified: Secondary | ICD-10-CM | POA: Diagnosis not present

## 2020-12-11 DIAGNOSIS — K760 Fatty (change of) liver, not elsewhere classified: Secondary | ICD-10-CM | POA: Diagnosis not present

## 2020-12-11 DIAGNOSIS — R7989 Other specified abnormal findings of blood chemistry: Secondary | ICD-10-CM | POA: Diagnosis not present

## 2020-12-12 ENCOUNTER — Other Ambulatory Visit: Payer: Self-pay | Admitting: *Deleted

## 2020-12-12 MED ORDER — ISOSORBIDE MONONITRATE ER 60 MG PO TB24: 60.0000 mg | ORAL_TABLET | Freq: Every morning | ORAL | 2 refills | Status: DC

## 2020-12-12 NOTE — Patient Instructions (Signed)
Your procedure is scheduled on: 12/17/2020  Report to [facility] at    6:45 AM.  Call this number if you have problems the morning of surgery: [phone]   Remember:              Follow Directions on the letter you received from Your Physician's office regarding the Bowel Prep              No Smoking the day of Procedure :   Take these medicines the morning of surgery with A SIP OF WATER: Amlodipine, Aripiprazole, Bupropion, Esomeprazole, isosorbide, metoprolol, and sertraline   Do not wear jewelry, make-up or nail polish.    Do not bring valuables to the hospital.  Contacts, dentures or bridgework may not be worn into surgery.  .   Patients discharged the day of surgery will not be allowed to drive home.     Colonoscopy, Adult, Care After This sheet gives you information about how to care for yourself after your procedure. Your health care provider may also give you more specific instructions. If you have problems or questions, contact your health care provider. What can I expect after the procedure? After the procedure, it is common to have: A small amount of blood in your stool for 24 hours after the procedure. Some gas. Mild abdominal cramping or bloating.  Follow these instructions at home: General instructions  For the first 24 hours after the procedure: Do not drive or use machinery. Do not sign important documents. Do not drink alcohol. Do your regular daily activities at a slower pace than normal. Eat soft, easy-to-digest foods. Rest often. Take over-the-counter or prescription medicines only as told by your health care provider. It is up to you to get the results of your procedure. Ask your health care provider, or the department performing the procedure, when your results will be ready. Relieving cramping and bloating Try walking around when you have cramps or feel bloated. Apply heat to your abdomen as told by your health care provider. Use a heat source  that your health care provider recommends, such as a moist heat pack or a heating pad. Place a towel between your skin and the heat source. Leave the heat on for 20-30 minutes. Remove the heat if your skin turns bright red. This is especially important if you are unable to feel pain, heat, or cold. You may have a greater risk of getting burned. Eating and drinking Drink enough fluid to keep your urine clear or pale yellow. Resume your normal diet as instructed by your health care provider. Avoid heavy or fried foods that are hard to digest. Avoid drinking alcohol for as long as instructed by your health care provider. Contact a health care provider if: You have blood in your stool 2-3 days after the procedure. Get help right away if: You have more than a small spotting of blood in your stool. You pass large blood clots in your stool. Your abdomen is swollen. You have nausea or vomiting. You have a fever. You have increasing abdominal pain that is not relieved with medicine. This information is not intended to replace advice given to you by your health care provider. Make sure you discuss any questions you have with your health care provider. Document Released: 09/25/2003 Document Revised: 11/05/2015 Document Reviewed: 04/24/2015 Elsevier Interactive Patient Education  2018 [place] End[facility], Adult, Care After This sheet gives you information about how to care for yourself after your procedure. Your  health care provider may also give you more specific instructions. If you have problems or questions, contact your health care provider. What can I expect after the procedure? After the procedure, it is common to have: A sore throat. Mild stomach pain or discomfort. Bloating. Nausea. Follow these instructions at home:  Follow instructions from your health care provider about what to eat or drink after your procedure. Return to your normal activities as told by your health care  provider. Ask your health care provider what activities are safe for you. Take over-the-counter and prescription medicines only as told by your health care provider. If you were given a sedative during the procedure, it can affect you for several hours. Do not drive or operate machinery until your health care provider says that it is safe. Keep all follow-up visits as told by your health care provider. This is important. Contact a health care provider if you have: A sore throat that lasts longer than one day. Trouble swallowing. Get help right away if: You vomit blood or your vomit looks like coffee grounds. You have: A fever. Bloody, black, or tarry stools. A severe sore throat or you cannot swallow. Difficulty breathing. Severe pain in your chest or abdomen. Summary After the procedure, it is common to have a sore throat, mild stomach discomfort, bloating, and nausea. If you were given a sedative during the procedure, it can affect you for several hours. Do not drive or operate machinery until your health care provider says that it is safe. Follow instructions from your health care provider about what to eat or drink after your procedure. Return to your normal activities as told by your health care provider. This information is not intended to replace advice given to you by your health care provider. Make sure you discuss any questions you have with your health care provider. Document Revised: 02/08/2019 Document Reviewed: 07/13/2017 Elsevier Patient Education  2022 [employer].

## 2020-12-13 ENCOUNTER — Other Ambulatory Visit: Payer: Self-pay

## 2020-12-13 ENCOUNTER — Encounter (HOSPITAL_COMMUNITY)
Admission: RE | Admit: 2020-12-13 | Discharge: 2020-12-13 | Disposition: A | Discharge status: Home / Self Care | Payer: Medicare Other | Source: Ambulatory Visit | Attending: Internal Medicine | Admitting: Internal Medicine

## 2020-12-13 VITALS — BP 129/71 | HR 69 | Temp 97.6°F | Resp 18 | Ht 68.0 in | Wt 185.0 lb

## 2020-12-13 DIAGNOSIS — F1920 Other psychoactive substance dependence, uncomplicated: Secondary | ICD-10-CM | POA: Diagnosis not present

## 2020-12-13 DIAGNOSIS — N289 Disorder of kidney and ureter, unspecified: Secondary | ICD-10-CM | POA: Diagnosis not present

## 2020-12-13 DIAGNOSIS — Z01812 Encounter for preprocedural laboratory examination: Secondary | ICD-10-CM | POA: Diagnosis present

## 2020-12-13 DIAGNOSIS — N1831 Chronic kidney disease, stage 3a: Secondary | ICD-10-CM | POA: Insufficient documentation

## 2020-12-13 LAB — BASIC METABOLIC PANEL
Anion gap: 6 (ref 5–15)
BUN: 43 mg/dL — ABNORMAL HIGH (ref 8–23)
CO2: 25 mmol/L (ref 22–32)
Calcium: 8.4 mg/dL — ABNORMAL LOW (ref 8.9–10.3)
Chloride: 109 mmol/L (ref 98–111)
Creatinine, Ser: 3.27 mg/dL — ABNORMAL HIGH (ref 0.61–1.24)
GFR, Estimated: 20 mL/min — ABNORMAL LOW (ref >60)
Glucose, Bld: 86 mg/dL (ref 70–99)
Potassium: 3.3 mmol/L — ABNORMAL LOW (ref 3.5–5.1)
Sodium: 140 mmol/L (ref 135–145)

## 2020-12-13 LAB — RAPID URINE DRUG SCREEN, HOSP PERFORMED
Amphetamines: NOT DETECTED
Barbiturates: NOT DETECTED
Benzodiazepines: POSITIVE — AB
Cocaine: NOT DETECTED
Opiates: NOT DETECTED
Tetrahydrocannabinol: NOT DETECTED

## 2020-12-14 DIAGNOSIS — Z9181 History of falling: Secondary | ICD-10-CM | POA: Diagnosis not present

## 2020-12-14 DIAGNOSIS — R531 Weakness: Secondary | ICD-10-CM | POA: Diagnosis not present

## 2020-12-14 DIAGNOSIS — R262 Difficulty in walking, not elsewhere classified: Secondary | ICD-10-CM | POA: Diagnosis not present

## 2020-12-17 ENCOUNTER — Ambulatory Visit (HOSPITAL_COMMUNITY): Payer: Medicare Other | Admitting: Anesthesiology

## 2020-12-17 ENCOUNTER — Other Ambulatory Visit: Payer: Self-pay | Admitting: *Deleted

## 2020-12-17 ENCOUNTER — Encounter (HOSPITAL_COMMUNITY)
Admission: RE | Disposition: A | Discharge status: Home / Self Care | Payer: Self-pay | Source: Ambulatory Visit | Attending: Internal Medicine

## 2020-12-17 ENCOUNTER — Encounter (HOSPITAL_COMMUNITY): Payer: Self-pay

## 2020-12-17 ENCOUNTER — Ambulatory Visit (HOSPITAL_COMMUNITY)
Admission: RE | Admit: 2020-12-17 | Discharge: 2020-12-17 | Disposition: A | Discharge status: Home / Self Care | Payer: Medicare Other | Source: Ambulatory Visit | Attending: Internal Medicine | Admitting: Internal Medicine

## 2020-12-17 DIAGNOSIS — R634 Abnormal weight loss: Secondary | ICD-10-CM | POA: Insufficient documentation

## 2020-12-17 DIAGNOSIS — Z79899 Other long term (current) drug therapy: Secondary | ICD-10-CM | POA: Diagnosis not present

## 2020-12-17 DIAGNOSIS — I2510 Atherosclerotic heart disease of native coronary artery without angina pectoris: Secondary | ICD-10-CM | POA: Diagnosis not present

## 2020-12-17 DIAGNOSIS — K2970 Gastritis, unspecified, without bleeding: Secondary | ICD-10-CM | POA: Diagnosis not present

## 2020-12-17 DIAGNOSIS — K2100 Gastro-esophageal reflux disease with esophagitis, without bleeding: Secondary | ICD-10-CM | POA: Diagnosis not present

## 2020-12-17 DIAGNOSIS — Z7901 Long term (current) use of anticoagulants: Secondary | ICD-10-CM | POA: Insufficient documentation

## 2020-12-17 DIAGNOSIS — Z888 Allergy status to other drugs, medicaments and biological substances status: Secondary | ICD-10-CM | POA: Diagnosis not present

## 2020-12-17 DIAGNOSIS — Z86718 Personal history of other venous thrombosis and embolism: Secondary | ICD-10-CM | POA: Diagnosis not present

## 2020-12-17 DIAGNOSIS — Z1211 Encounter for screening for malignant neoplasm of colon: Secondary | ICD-10-CM | POA: Insufficient documentation

## 2020-12-17 DIAGNOSIS — Z6828 Body mass index (BMI) 28.0-28.9, adult: Secondary | ICD-10-CM | POA: Insufficient documentation

## 2020-12-17 DIAGNOSIS — R1013 Epigastric pain: Secondary | ICD-10-CM | POA: Diagnosis not present

## 2020-12-17 DIAGNOSIS — Z86010 Personal history of colonic polyps: Secondary | ICD-10-CM

## 2020-12-17 DIAGNOSIS — K648 Other hemorrhoids: Secondary | ICD-10-CM | POA: Insufficient documentation

## 2020-12-17 DIAGNOSIS — K319 Disease of stomach and duodenum, unspecified: Secondary | ICD-10-CM | POA: Diagnosis not present

## 2020-12-17 DIAGNOSIS — Z87891 Personal history of nicotine dependence: Secondary | ICD-10-CM | POA: Insufficient documentation

## 2020-12-17 HISTORY — PX: ESOPHAGOGASTRODUODENOSCOPY (EGD) WITH PROPOFOL: SHX5813

## 2020-12-17 HISTORY — PX: COLONOSCOPY WITH PROPOFOL: SHX5780

## 2020-12-17 HISTORY — PX: BIOPSY: SHX5522

## 2020-12-17 SURGERY — COLONOSCOPY WITH PROPOFOL
Anesthesia: General

## 2020-12-17 MED ORDER — PHENYLEPHRINE HCL (PRESSORS) 10 MG/ML IV SOLN
INTRAVENOUS | Status: DC | PRN
  Administered 2020-12-17: 80 ug via INTRAVENOUS

## 2020-12-17 MED ORDER — PROPOFOL 10 MG/ML IV BOLUS
INTRAVENOUS | Status: DC | PRN
  Administered 2020-12-17: 80 mg via INTRAVENOUS

## 2020-12-17 MED ORDER — LIDOCAINE VISCOUS HCL 2 % MT SOLN
15.0000 mL | Freq: Once | OROMUCOSAL | Status: AC
  Administered 2020-12-17: 15 mL via OROMUCOSAL

## 2020-12-17 MED ORDER — PROPOFOL 500 MG/50ML IV EMUL
INTRAVENOUS | Status: DC | PRN
  Administered 2020-12-17: 125 ug/kg/min via INTRAVENOUS

## 2020-12-17 MED ORDER — LIDOCAINE VISCOUS HCL 2 % MT SOLN
OROMUCOSAL | Status: AC
  Filled 2020-12-17: qty 15

## 2020-12-17 MED ORDER — LIDOCAINE HCL 1 % IJ SOLN
INTRAMUSCULAR | Status: DC | PRN
  Administered 2020-12-17: 50 mg via INTRADERMAL

## 2020-12-17 MED ORDER — LACTATED RINGERS IV SOLN: INTRAVENOUS | Status: DC

## 2020-12-17 NOTE — Anesthesia Preprocedure Evaluation (Addendum)
Anesthesia Evaluation  Patient identified by MRN, date of birth, ID band Patient awake    Reviewed: Allergy & Precautions, NPO status , Patient's Chart, lab work & pertinent test results, reviewed documented beta blocker date and time   History of Anesthesia Complications Negative for: history of anesthetic complications  Airway Mallampati: III  TM Distance: >3 FB Neck ROM: Full    Dental  (+) Dental Advisory Given, Missing   Pulmonary former smoker,    Pulmonary exam normal breath sounds clear to auscultation       Cardiovascular hypertension, Pt. on home beta blockers and Pt. on medications + CAD, + Past MI and + Peripheral Vascular Disease  Normal cardiovascular exam Rhythm:Regular Rate:Normal  . Left ventricular ejection fraction, by estimation, is 30 to 35%. The left ventricle has moderate to severely decreased function. The left ventricle demonstrates regional wall motion abnormalities (see scoring  diagram/findings for description). Left ventricular diastolic parameters are consistent with Grade I diastolic  dysfunction (impaired relaxation). There is akinesis of the left ventricular, entire inferolateral wall.  2. Right ventricular systolic function is normal. The right ventricular size is normal.  3. The mitral valve is grossly normal. Mild mitral valve regurgitation.  4. The aortic valve is tricuspid. Aortic valve regurgitation is mild to moderate.  5. The inferior vena cava is normal in size with greater than 50% respiratory variability, suggesting right atrial pressure of 3 mmHg.   Neuro/Psych PSYCHIATRIC DISORDERS Depression    GI/Hepatic Neg liver ROS, GERD  Controlled and Medicated,  Endo/Other  negative endocrine ROS  Renal/GU Renal InsufficiencyRenal disease     Musculoskeletal negative musculoskeletal ROS (+)   Abdominal   Peds  Hematology negative hematology ROS (+)   Anesthesia Other  Findings   Reproductive/Obstetrics negative OB ROS                            Anesthesia Physical Anesthesia Plan  ASA: 4  Anesthesia Plan: General   Post-op Pain Management:    Induction: Intravenous  PONV Risk Score and Plan: TIVA  Airway Management Planned: Nasal Cannula and Natural Airway  Additional Equipment:   Intra-op Plan:   Post-operative Plan:   Informed Consent: I have reviewed the patients History and Physical, chart, labs and discussed the procedure including the risks, benefits and alternatives for the proposed anesthesia with the patient or authorized representative who has indicated his/her understanding and acceptance.     Dental advisory given  Plan Discussed with: CRNA and Surgeon  Anesthesia Plan Comments: (Risks of anesthesia including cardiac and renal functions getting worse after anesthesia with his comorbidities was explained, patient agreed to get anesthesia )      Anesthesia Quick Evaluation

## 2020-12-17 NOTE — Op Note (Signed)
[facility] Patient Name: Glenn Hawkins Procedure Date: 12/17/2020 8:29 AM MRN: [id number] Date of Birth: 06-10-1955 Attending MD: Elon Alas. Edgar Frisk CSN: [id number] Age: 65 Admit Type: Outpatient Procedure:                Upper GI endoscopy Indications:              Epigastric abdominal pain, Weight loss Providers:                Elon Alas. Abbey Chatters, DO, [place] Page, Janeece Riggers,                            RN, Nelma Rothman, Technician, Kristine L. Risa Grill,                            Technician Referring MD:              Medicines:                See the Anesthesia note for documentation of the                            administered medications Complications:            No immediate complications. Estimated Blood Loss:     Estimated blood loss was minimal. Procedure:                Pre-Anesthesia Assessment:                           - The anesthesia plan was to use monitored                            anesthesia care (MAC).                           After obtaining informed consent, the endoscope was                            passed under direct vision. Throughout the                            procedure, the patient's blood pressure, pulse, and                            oxygen saturations were monitored continuously. The                            [id number]-[id number] ([id number]) scope was introduced through the                            mouth, and advanced to the second part of duodenum.                            The upper GI endoscopy was accomplished without                            difficulty. The  patient tolerated the procedure                            well. Scope In: 8:39:54 AM Scope Out: 8:43:35 AM Total Procedure Duration: 0 hours 3 minutes 41 seconds  Findings:      LA Grade A (one or more mucosal breaks less than 5 mm, not extending       between tops of 2 mucosal folds) esophagitis with no bleeding was found       at the gastroesophageal junction.      Diffuse mild  inflammation characterized by erosions was found in the       gastric antrum. Biopsies were taken with a cold forceps for Helicobacter       pylori testing.      The duodenal bulb, first portion of the duodenum and second portion of       the duodenum were normal. Impression:               - LA Grade A reflux esophagitis with no bleeding.                           - Gastritis. Biopsied.                           - Normal duodenal bulb, first portion of the                            duodenum and second portion of the duodenum. Moderate Sedation:      Per Anesthesia Care Recommendation:           - Patient has a contact number available for                            emergencies. The signs and symptoms of potential                            delayed complications were discussed with the                            patient. Return to normal activities tomorrow.                            Written discharge instructions were provided to the                            patient.                           - Resume previous diet.                           - Continue present medications.                           - Await pathology results.                           - Use a proton pump inhibitor  PO daily.                           - Return to GI clinic in 4 months. Procedure Code(s):        --- Professional ---                           [phone], Esophagogastroduodenoscopy, flexible,                            transoral; with biopsy, single or multiple Diagnosis Code(s):        --- Professional ---                           K21.00, Gastro-esophageal reflux disease with                            esophagitis, without bleeding                           K29.70, Gastritis, unspecified, without bleeding                           R10.13, Epigastric pain                           R63.4, Abnormal weight loss CPT copyright 2019 American Medical Association. All rights reserved. The codes documented in this  report are preliminary and upon coder review may  be revised to meet current compliance requirements. Elon Alas. Abbey Chatters, DO [place] Abbey Chatters, DO 12/17/2020 8:46:32 AM This report has been signed electronically. Number of Addenda: 0

## 2020-12-17 NOTE — Op Note (Signed)
[facility] Patient Name: Glenn Hawkins Procedure Date: 12/17/2020 8:44 AM MRN: [id number] Date of Birth: May 31, 1955 Attending MD: Elon Alas. Abbey Chatters DO CSN: [id number] Age: 65 Admit Type: Outpatient Procedure:                Colonoscopy Indications:              Surveillance: Personal history of adenomatous                            polyps on last colonoscopy 5 years ago Providers:                Elon Alas. Abbey Chatters, DO, Janeece Riggers, RN, [place]                            Page, Nelma Rothman, Technician Referring MD:              Medicines:                See the Anesthesia note for documentation of the                            administered medications Complications:            No immediate complications. Estimated Blood Loss:     Estimated blood loss: none. Procedure:                Pre-Anesthesia Assessment:                           - The anesthesia plan was to use monitored                            anesthesia care (MAC).                           After obtaining informed consent, the colonoscope                            was passed under direct vision. Throughout the                            procedure, the patient's blood pressure, pulse, and                            oxygen saturations were monitored continuously. The                            [id number]-[id number] ([id number]) scope was introduced through                            the anus and advanced to the the cecum, identified                            by appendiceal orifice and ileocecal valve. The                            colonoscopy was performed without  difficulty. The                            patient tolerated the procedure well. The quality                            of the bowel preparation was evaluated using the                            BBPS [facility] Bowel Preparation Scale) with scores                            of: Right Colon = 3, Transverse Colon = 3 and Left                            Colon = 3 (entire  mucosa seen well with no residual                            staining, small fragments of stool or opaque                            liquid). The total BBPS score equals 9. Scope In: 8:47:00 AM Scope Out: 8:57:27 AM Scope Withdrawal Time: 0 hours 6 minutes 44 seconds  Total Procedure Duration: 0 hours 10 minutes 27 seconds  Findings:      The perianal and digital rectal examinations were normal.      Non-bleeding internal hemorrhoids were found during endoscopy.      The exam was otherwise without abnormality. Impression:               - Non-bleeding internal hemorrhoids.                           - The examination was otherwise normal.                           - No specimens collected. Moderate Sedation:      Per Anesthesia Care Recommendation:           - Patient has a contact number available for                            emergencies. The signs and symptoms of potential                            delayed complications were discussed with the                            patient. Return to normal activities tomorrow.                            Written discharge instructions were provided to the                            patient.                           -  Resume previous diet.                           - Continue present medications.                           - Repeat colonoscopy in 10 years for screening                            purposes.                           - Return to GI clinic in 4 years. Procedure Code(s):        --- Professional ---                           V7616, Colorectal cancer screening; colonoscopy on                            individual at high risk Diagnosis Code(s):        --- Professional ---                           Z86.010, Personal history of colonic polyps                           K64.8, Other hemorrhoids CPT copyright 2019 American Medical Association. All rights reserved. The codes documented in this report are preliminary and upon coder review  may  be revised to meet current compliance requirements. Elon Alas. Abbey Chatters, DO [place], DO 12/17/2020 9:02:08 AM This report has been signed electronically. Number of Addenda: 0

## 2020-12-17 NOTE — Discharge Instructions (Addendum)
EGD Discharge instructions Please read the instructions outlined below and refer to this sheet in the next few weeks. These discharge instructions provide you with general information on caring for yourself after you leave the hospital. Your doctor may also give you specific instructions. While your treatment has been planned according to the most current medical practices available, unavoidable complications occasionally occur. If you have any problems or questions after discharge, please call your doctor. ACTIVITY You may resume your regular activity but move at a slower pace for the next 24 hours.  Take frequent rest periods for the next 24 hours.  Walking will help expel (get rid of) the air and reduce the bloated feeling in your abdomen.  No driving for 24 hours (because of the anesthesia (medicine) used during the test).  You may shower.  Do not sign any important legal documents or operate any machinery for 24 hours (because of the anesthesia used during the test).  NUTRITION Drink plenty of fluids.  You may resume your normal diet.  Begin with a light meal and progress to your normal diet.  Avoid alcoholic beverages for 24 hours or as instructed by your caregiver.  MEDICATIONS You may resume your normal medications unless your caregiver tells you otherwise.  WHAT YOU CAN EXPECT TODAY You may experience abdominal discomfort such as a feeling of fullness or "gas" pains.  FOLLOW-UP Your doctor will discuss the results of your test with you.  SEEK IMMEDIATE MEDICAL ATTENTION IF ANY OF THE FOLLOWING OCCUR: Excessive nausea (feeling sick to your stomach) and/or vomiting.  Severe abdominal pain and distention (swelling).  Trouble swallowing.  Temperature over 101 F (37.8 C).  Rectal bleeding or vomiting of blood.     Colonoscopy Discharge Instructions  Read the instructions outlined below and refer to this sheet in the next few weeks. These discharge instructions provide you with  general information on caring for yourself after you leave the hospital. Your doctor may also give you specific instructions. While your treatment has been planned according to the most current medical practices available, unavoidable complications occasionally occur.   ACTIVITY You may resume your regular activity, but move at a slower pace for the next 24 hours.  Take frequent rest periods for the next 24 hours.  Walking will help get rid of the air and reduce the bloated feeling in your belly (abdomen).  No driving for 24 hours (because of the medicine (anesthesia) used during the test).   Do not sign any important legal documents or operate any machinery for 24 hours (because of the anesthesia used during the test).  NUTRITION Drink plenty of fluids.  You may resume your normal diet as instructed by your doctor.  Begin with a light meal and progress to your normal diet. Heavy or fried foods are harder to digest and may make you feel sick to your stomach (nauseated).  Avoid alcoholic beverages for 24 hours or as instructed.  MEDICATIONS You may resume your normal medications unless your doctor tells you otherwise.  WHAT YOU CAN EXPECT TODAY Some feelings of bloating in the abdomen.  Passage of more gas than usual.  Spotting of blood in your stool or on the toilet paper.  IF YOU HAD POLYPS REMOVED DURING THE COLONOSCOPY: No aspirin products for 7 days or as instructed.  No alcohol for 7 days or as instructed.  Eat a soft diet for the next 24 hours.  FINDING OUT THE RESULTS OF YOUR TEST Not all test results are  available during your visit. If your test results are not back during the visit, make an appointment with your caregiver to find out the results. Do not assume everything is normal if you have not heard from your caregiver or the medical facility. It is important for you to follow up on all of your test results.  SEEK IMMEDIATE MEDICAL ATTENTION IF: You have more than a spotting of  blood in your stool.  Your belly is swollen (abdominal distention).  You are nauseated or vomiting.  You have a temperature over 101.  You have abdominal pain or discomfort that is severe or gets worse throughout the day.   Your EGD revealed mild amount inflammation in your stomach.  I took biopsies of this to rule out infection with a bacteria called H. pylori.  Await pathology results, my office will contact you. Continue on esomeprazole daily.   Your colonoscopy was relatively unremarkable.  I did not find any polyps or evidence of colon cancer.  I recommend repeating colonoscopy in 10 years for colon cancer screening purposes.    Follow up with GI in 4 months.   I hope you have a great rest of your week!  Elon Alas. Abbey Chatters, D.O. Gastroenterology and Hepatology [facility]

## 2020-12-17 NOTE — Anesthesia Postprocedure Evaluation (Signed)
Anesthesia Post Note  Patient: Glenn Hawkins  Procedure(s) Performed: COLONOSCOPY WITH PROPOFOL ESOPHAGOGASTRODUODENOSCOPY (EGD) WITH PROPOFOL BIOPSY  Patient location during evaluation: Short Stay Anesthesia Type: General Level of consciousness: awake and alert Vital Signs Assessment: post-procedure vital signs reviewed and stable Respiratory status: spontaneous breathing Cardiovascular status: blood pressure returned to baseline and stable Postop Assessment: no apparent nausea or vomiting Anesthetic complications: no   No notable events documented.   Last Vitals:  Vitals:   12/17/20 0653  BP: (!) 141/85  Pulse: 90  Resp: 18  Temp: 36.8 C  SpO2: 98%    Last Pain:  Vitals:   12/17/20 0842  TempSrc:   PainSc: 0-No pain                 Trevia Nop

## 2020-12-17 NOTE — Patient Outreach (Signed)
[facility]) Care Management  12/17/2020  Glenn Hawkins 24-Sep-1955 [id number]  Unsuccessful outreach. This was follow up call after materials were sent to invite [name] to participate in [place] Management services.  [name]. [name] Neither, MSN, [facility] Gerontological Nurse Practitioner [facility] Care Management [phone]

## 2020-12-17 NOTE — H&P (Signed)
Primary Care Physician:  Glenda Chroman, MD Primary Gastroenterologist:  Dr. Abbey Chatters  Pre-Procedure History & Physical: HPI:  Glenn Hawkins is a 65 y.o. male is here for an EGD for postprandial abdominal pain, weight loss and colonoscopy to be performed for colon cancer surveillance purposes.   Past Medical History:  Diagnosis Date   Aortic insufficiency    a. 08/2019 Echo: mild to mod AI.   C. difficile diarrhea 07/2020   CKD (chronic kidney disease) stage 3, GFR 30-59 ml/min (HCC)    baseline Cr around 2.5   Coronary atherosclerosis of native coronary artery    a. 02/2011 Late presentation MI-->Myoview w/ large area of transmural infarct in LCX distribution, no ischemia.   Depressive disorder, not elsewhere classified    Esophageal reflux    Gout, unspecified    Hemorrhoids    a. 01/2016 s/p hemorrhoidectomy.   HFrEF (heart failure with reduced ejection fraction) ([place])    a. 04/2012 Echo: EF 40%, inflat AK, Gr1 DD, mild AI/MR, triv TR; b. 11/2017 EchoP EF 30-35%, inflat HK; c. 08/2019 Echo: EF 30-35%, gr1 DD, inflat AK.   History of DVT (deep vein thrombosis)    a. Chronic Eliquis.   Hyperlipidemia LDL goal <70    Hypertension    Hypotension, unspecified    Ischemic cardiomyopathy    a. 04/2012 Echo: EF 40%, inflat AK, Gr1 DD, mild AI/MR, triv TR; b. 11/2017 EchoP EF 30-35%, inflat HK; c. 08/2019 Echo: EF 30-35%, gr1 DD, inflat AK. Nl RV size/fxn. Mild MR. Mild to mod AI.   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 04/2012 Echo: EF 40%, mild MR.   Myocardial infarction (lateral wall) ([place]) 2013   a. 02/2011 - late presentation. Managed conservatively 2/2 CKD;  b. 02/2011 Myoview: Large transmural infarct in the LCX distribution, no ischemia.   PAD (peripheral artery disease) ([place])    a. 08/2015 ABI/duplex: R: 0.86, L 0.96. Duplex w/ bilateral heterogeneous plaque in mid fem arteries, no significant stenoses; b. 08/2019 ABI/Duplex: prob bilat inflow dzs w/ stable ABIs (R 0.85, L 0.93).   Personal  history of tobacco use, presenting hazards to health    Torn ligament    Unspecified schizophrenia, unspecified condition     Past Surgical History:  Procedure Laterality Date   COLONOSCOPY     COLONOSCOPY N/A 11/10/2014   Procedure: COLONOSCOPY;  Surgeon: Manus Gunning, MD;  Location: [address];  Service: Gastroenterology;  Laterality: N/A;   COLONOSCOPY WITH PROPOFOL N/A 05/08/2015   Surgeon: [name], MD; nonthrombosed external hemorrhoids, one 8 mm tubular adenoma, one 4 mm tubular adenoma.  Repeat colonoscopy in 5-10 years.   ESOPHAGOGASTRODUODENOSCOPY (EGD) WITH PROPOFOL N/A 05/08/2015   Surgeon: [name], MD; LA grade a reflux esophagitis, normal stomach and duodenum.   EYE SURGERY Right    removal of foreign body   HEMORRHOID SURGERY N/A 02/20/2016   Procedure: EXTENSIVE HEMORRHOIDECTOMY;  Surgeon: [name], MD;  Location: [facility];  Service: General;  Laterality: N/A;   None     POLYPECTOMY  05/08/2015   Procedure: POLYPECTOMY;  Surgeon: [name], MD;  Location: [facility];  Service: Endoscopy;;  transverse colon polyp    Prior to Admission medications   Medication Sig Start Date End Date Taking? Authorizing Provider  amLODipine (NORVASC) 10 MG tablet Take 10 mg by mouth daily.   Yes [provider]  apixaban (ELIQUIS) 2.5 MG TABS tablet Take 2.5 mg by mouth 2 (two)  times daily.   Yes [provider]  ARIPiprazole (ABILIFY) 20 MG tablet Take 20 mg by mouth daily.   Yes [provider]  buPROPion ER (WELLBUTRIN SR) 100 MG 12 hr tablet Take 100 mg by mouth 2 (two) times daily. 08/20/20  Yes [provider]  Cholecalciferol 50 MCG (2000 UT) TABS Take 2,000 Units by mouth daily.   Yes [provider]  COLCRYS 0.6 MG tablet Take 0.6 mg by mouth 2 (two) times daily. 05/22/15  Yes [provider]  esomeprazole (NEXIUM) 20 MG capsule Take 1 capsule (20 mg total) by mouth daily before  breakfast. Patient taking differently: Take 20 mg by mouth daily as needed (acid reflux). 11/08/20  Yes Erenest Rasher, PA-C  isosorbide mononitrate (IMDUR) 60 MG 24 hr tablet Take 1 tablet (60 mg total) by mouth every morning. 12/12/20  Yes Wellington Hampshire, MD  LORazepam (ATIVAN) 2 MG tablet Take 2 mg by mouth at bedtime. 2mg  at night   Yes [provider]  metoprolol tartrate (LOPRESSOR) 100 MG tablet Take 100 mg by mouth 2 (two) times daily. 10/11/20  Yes [provider]  QUEtiapine (SEROQUEL) 300 MG tablet Take 450 mg by mouth at bedtime. 10/06/17  Yes [provider]  rosuvastatin (CRESTOR) 40 MG tablet Take 40 mg by mouth daily.   Yes [provider]  sertraline (ZOLOFT) 100 MG tablet Take 200 mg by mouth daily.   Yes [provider]  sodium bicarbonate 650 MG tablet Take 650 mg by mouth 2 (two) times daily.   Yes [provider]  lactulose (CHRONULAC) 10 GM/15ML solution Take 15 mLs (10 g total) by mouth 2 (two) times daily. Patient not taking: No sig reported 11/09/20   Erenest Rasher, PA-C  latanoprost (XALATAN) 0.005 % ophthalmic solution Place 1 drop into both eyes at bedtime. 10/11/20   [provider]  nitroGLYCERIN (NITROSTAT) 0.4 MG SL tablet Place 1 tablet (0.4 mg total) under the tongue every 5 (five) minutes as needed for chest pain. Patient not taking: Reported on 12/11/2020 03/21/16   Wellington Hampshire, MD    Allergies as of 11/28/2020 - Review Complete 11/26/2020  Allergen Reaction Noted   Nifedipine Diarrhea 12/08/2013   Rabeprazole Other (See Comments) 02/11/2016   Benazepril Other (See Comments) 03/07/2011   Haloperidol Anxiety 10/21/2017   Haloperidol and related Other (See Comments) 03/07/2011   Omeprazole Other (See Comments) 03/07/2011    Family History  Problem Relation Age of Onset   Crohn's disease Sister    Colon cancer Neg Hx     Social History   Socioeconomic History   Marital status:  Divorced    Spouse name: Not on file   Number of children: Not on file   Years of education: Not on file   Highest education level: Not on file  Occupational History   Not on file  Tobacco Use   Smoking status: Former    Packs/day: 1.00    Years: 23.00    Pack years: 23.00    Types: Cigarettes    Quit date: 02/25/1996    Years since quitting: 24.8   Smokeless tobacco: Never   Tobacco comments:    + 15 years of smoking  Vaping Use   Vaping Use: Never used  Substance and Sexual Activity   Alcohol use: No    Comment: Daily alcohol use 20+ years ago   Drug use: Not Currently    Types: Cocaine  Comment: History of intranasal cocaine and speed 20+ years ago.   Sexual activity: Not Currently    Birth control/protection: None  Other Topics Concern   Not on file  Social History Narrative   Not on file   Social Determinants of Health   Financial Resource Strain: Not on file  Food Insecurity: No Food Insecurity   Worried About Running Out of Food in the Last Year: Never true   Ran Out of Food in the Last Year: Never true  Transportation Needs: Not on file  Physical Activity: Not on file  Stress: Not on file  Social Connections: Not on file  Intimate Partner Violence: Not on file    Review of Systems: See HPI, otherwise negative ROS  Physical Exam: Vital signs in last 24 hours: Temp:  [98.2 F (36.8 C)] 98.2 F (36.8 C) (10/24 0653) Pulse Rate:  [90] 90 (10/24 0653) Resp:  [18] 18 (10/24 0653) BP: (141)/(85) 141/85 (10/24 0653) SpO2:  [98 %] 98 % (10/24 0653)   General:   Alert,  Well-developed, well-nourished, pleasant and cooperative in NAD Head:  Normocephalic and atraumatic. Eyes:  Sclera clear, no icterus.   Conjunctiva pink. Ears:  Normal auditory acuity. Nose:  No deformity, discharge,  or lesions. Mouth:  No deformity or lesions, dentition normal. Neck:  Supple; no masses or thyromegaly. Lungs:  Clear throughout to auscultation.   No wheezes, crackles, or  rhonchi. No acute distress. Heart:  Regular rate and rhythm; no murmurs, clicks, rubs,  or gallops. Abdomen:  Soft, nontender and nondistended. No masses, hepatosplenomegaly or hernias noted. Normal bowel sounds, without guarding, and without rebound.   Msk:  Symmetrical without gross deformities. Normal posture. Extremities:  Without clubbing or edema. Neurologic:  Alert and  oriented x4;  grossly normal neurologically. Skin:  Intact without significant lesions or rashes. Cervical Nodes:  No significant cervical adenopathy. Psych:  Alert and cooperative. Normal mood and affect.  Impression/Plan: TREK KIMBALL is here for an EGD for postprandial abdominal pain, weight loss and colonoscopy to be performed for colon cancer surveillance purposes.   The risks of the procedure including infection, bleed, or perforation as well as benefits, limitations, alternatives and imponderables have been reviewed with the patient. Questions have been answered. All parties agreeable.

## 2020-12-17 NOTE — Transfer of Care (Signed)
Immediate Anesthesia Transfer of Care Note  Patient: Glenn Hawkins  Procedure(s) Performed: COLONOSCOPY WITH PROPOFOL ESOPHAGOGASTRODUODENOSCOPY (EGD) WITH PROPOFOL BIOPSY  Patient Location: Short Stay  Anesthesia Type:General  Level of Consciousness: awake  Airway & Oxygen Therapy: Patient Spontanous Breathing  Post-op Assessment: Report given to RN  Post vital signs: Reviewed  Last Vitals:  Vitals Value Taken Time  BP    Temp    Pulse    Resp    SpO2      Last Pain:  Vitals:   12/17/20 0842  TempSrc:   PainSc: 0-No pain      Patients Stated Pain Goal: 5 (20/03/79 4446)  Complications: No notable events documented.

## 2020-12-18 ENCOUNTER — Other Ambulatory Visit: Payer: Self-pay | Admitting: *Deleted

## 2020-12-18 ENCOUNTER — Encounter: Payer: Self-pay | Admitting: *Deleted

## 2020-12-18 LAB — SURGICAL PATHOLOGY

## 2020-12-18 NOTE — Patient Outreach (Signed)
[facility]) Care Management  12/18/2020  Glenn Hawkins 11-07-1955 [id number]  Telephone outreach to follow up with pt and verify if he choses to be involved or not with [facility] services. Pt hung up on called X 2. Caller interpreted this as pt wish to NOT participate. Will close this case.  [name]. [name] Neither, MSN, [facility] Gerontological Nurse Practitioner [facility] Care Management [phone]

## 2020-12-19 ENCOUNTER — Encounter (HOSPITAL_COMMUNITY): Payer: Self-pay | Admitting: Internal Medicine

## 2020-12-19 ENCOUNTER — Ambulatory Visit: Payer: Medicare Other | Admitting: Internal Medicine

## 2020-12-25 DIAGNOSIS — Z299 Encounter for prophylactic measures, unspecified: Secondary | ICD-10-CM | POA: Diagnosis not present

## 2020-12-25 DIAGNOSIS — E43 Unspecified severe protein-calorie malnutrition: Secondary | ICD-10-CM | POA: Diagnosis not present

## 2020-12-25 DIAGNOSIS — I5022 Chronic systolic (congestive) heart failure: Secondary | ICD-10-CM | POA: Diagnosis not present

## 2020-12-25 DIAGNOSIS — I10 Essential (primary) hypertension: Secondary | ICD-10-CM | POA: Diagnosis not present

## 2020-12-25 DIAGNOSIS — D6869 Other thrombophilia: Secondary | ICD-10-CM | POA: Diagnosis not present

## 2020-12-28 DIAGNOSIS — N189 Chronic kidney disease, unspecified: Secondary | ICD-10-CM | POA: Diagnosis not present

## 2020-12-28 DIAGNOSIS — E8722 Chronic metabolic acidosis: Secondary | ICD-10-CM | POA: Diagnosis not present

## 2020-12-28 DIAGNOSIS — I5042 Chronic combined systolic (congestive) and diastolic (congestive) heart failure: Secondary | ICD-10-CM | POA: Diagnosis not present

## 2020-12-28 DIAGNOSIS — I129 Hypertensive chronic kidney disease with stage 1 through stage 4 chronic kidney disease, or unspecified chronic kidney disease: Secondary | ICD-10-CM | POA: Diagnosis not present

## 2020-12-28 DIAGNOSIS — R809 Proteinuria, unspecified: Secondary | ICD-10-CM | POA: Diagnosis not present

## 2020-12-28 DIAGNOSIS — D638 Anemia in other chronic diseases classified elsewhere: Secondary | ICD-10-CM | POA: Diagnosis not present

## 2020-12-28 DIAGNOSIS — D696 Thrombocytopenia, unspecified: Secondary | ICD-10-CM | POA: Diagnosis not present

## 2020-12-28 DIAGNOSIS — N184 Chronic kidney disease, stage 4 (severe): Secondary | ICD-10-CM | POA: Diagnosis not present

## 2020-12-28 DIAGNOSIS — E876 Hypokalemia: Secondary | ICD-10-CM | POA: Diagnosis not present

## 2020-12-28 DIAGNOSIS — N170 Acute kidney failure with tubular necrosis: Secondary | ICD-10-CM | POA: Diagnosis not present

## 2021-01-04 DIAGNOSIS — D696 Thrombocytopenia, unspecified: Secondary | ICD-10-CM | POA: Diagnosis not present

## 2021-01-04 DIAGNOSIS — I5042 Chronic combined systolic (congestive) and diastolic (congestive) heart failure: Secondary | ICD-10-CM | POA: Diagnosis not present

## 2021-01-04 DIAGNOSIS — E876 Hypokalemia: Secondary | ICD-10-CM | POA: Diagnosis not present

## 2021-01-04 DIAGNOSIS — N170 Acute kidney failure with tubular necrosis: Secondary | ICD-10-CM | POA: Diagnosis not present

## 2021-01-04 DIAGNOSIS — I129 Hypertensive chronic kidney disease with stage 1 through stage 4 chronic kidney disease, or unspecified chronic kidney disease: Secondary | ICD-10-CM | POA: Diagnosis not present

## 2021-01-04 DIAGNOSIS — E8722 Chronic metabolic acidosis: Secondary | ICD-10-CM | POA: Diagnosis not present

## 2021-01-04 DIAGNOSIS — D638 Anemia in other chronic diseases classified elsewhere: Secondary | ICD-10-CM | POA: Diagnosis not present

## 2021-01-04 DIAGNOSIS — N184 Chronic kidney disease, stage 4 (severe): Secondary | ICD-10-CM | POA: Diagnosis not present

## 2021-02-08 DIAGNOSIS — I5022 Chronic systolic (congestive) heart failure: Secondary | ICD-10-CM | POA: Diagnosis not present

## 2021-02-08 DIAGNOSIS — R5383 Other fatigue: Secondary | ICD-10-CM | POA: Diagnosis not present

## 2021-02-08 DIAGNOSIS — Z299 Encounter for prophylactic measures, unspecified: Secondary | ICD-10-CM | POA: Diagnosis not present

## 2021-02-08 DIAGNOSIS — Z1331 Encounter for screening for depression: Secondary | ICD-10-CM | POA: Diagnosis not present

## 2021-02-08 DIAGNOSIS — Z79899 Other long term (current) drug therapy: Secondary | ICD-10-CM | POA: Diagnosis not present

## 2021-02-08 DIAGNOSIS — I10 Essential (primary) hypertension: Secondary | ICD-10-CM | POA: Diagnosis not present

## 2021-02-08 DIAGNOSIS — Z6827 Body mass index (BMI) 27.0-27.9, adult: Secondary | ICD-10-CM | POA: Diagnosis not present

## 2021-02-08 DIAGNOSIS — Z1339 Encounter for screening examination for other mental health and behavioral disorders: Secondary | ICD-10-CM | POA: Diagnosis not present

## 2021-02-08 DIAGNOSIS — M109 Gout, unspecified: Secondary | ICD-10-CM | POA: Diagnosis not present

## 2021-02-08 DIAGNOSIS — Z0000 Encounter for general adult medical examination without abnormal findings: Secondary | ICD-10-CM | POA: Diagnosis not present

## 2021-02-08 DIAGNOSIS — Z7189 Other specified counseling: Secondary | ICD-10-CM | POA: Diagnosis not present

## 2021-02-08 DIAGNOSIS — E7800 Pure hypercholesterolemia, unspecified: Secondary | ICD-10-CM | POA: Diagnosis not present

## 2021-02-14 DIAGNOSIS — Z299 Encounter for prophylactic measures, unspecified: Secondary | ICD-10-CM | POA: Diagnosis not present

## 2021-02-14 DIAGNOSIS — N189 Chronic kidney disease, unspecified: Secondary | ICD-10-CM | POA: Diagnosis not present

## 2021-02-14 DIAGNOSIS — M25571 Pain in right ankle and joints of right foot: Secondary | ICD-10-CM | POA: Diagnosis not present

## 2021-02-14 DIAGNOSIS — R2681 Unsteadiness on feet: Secondary | ICD-10-CM | POA: Diagnosis not present

## 2021-02-14 DIAGNOSIS — I5022 Chronic systolic (congestive) heart failure: Secondary | ICD-10-CM | POA: Diagnosis not present

## 2021-02-14 DIAGNOSIS — M25471 Effusion, right ankle: Secondary | ICD-10-CM | POA: Diagnosis not present

## 2021-02-14 DIAGNOSIS — M7989 Other specified soft tissue disorders: Secondary | ICD-10-CM | POA: Diagnosis not present

## 2021-02-14 DIAGNOSIS — F17210 Nicotine dependence, cigarettes, uncomplicated: Secondary | ICD-10-CM | POA: Diagnosis not present

## 2021-02-26 ENCOUNTER — Telehealth: Payer: Self-pay

## 2021-02-26 DIAGNOSIS — I429 Cardiomyopathy, unspecified: Secondary | ICD-10-CM | POA: Diagnosis not present

## 2021-02-26 DIAGNOSIS — I10 Essential (primary) hypertension: Secondary | ICD-10-CM | POA: Diagnosis not present

## 2021-02-26 DIAGNOSIS — N184 Chronic kidney disease, stage 4 (severe): Secondary | ICD-10-CM | POA: Diagnosis not present

## 2021-02-26 DIAGNOSIS — Z299 Encounter for prophylactic measures, unspecified: Secondary | ICD-10-CM | POA: Diagnosis not present

## 2021-02-26 DIAGNOSIS — I5022 Chronic systolic (congestive) heart failure: Secondary | ICD-10-CM | POA: Diagnosis not present

## 2021-02-26 DIAGNOSIS — R2681 Unsteadiness on feet: Secondary | ICD-10-CM | POA: Diagnosis not present

## 2021-02-26 NOTE — Telephone Encounter (Signed)
Received fax from [facility] for clarification: Levitra + organic nitrates-potential of hypotensive effects when Levitra is used with nitrates. The combination is contraindicated and an appropriate time interval between administration of Levitra and nitrates has not been established.   Vardenafil 20mg  is not reflected on his med list. Called patient to find out whether or not he is taking this this medication but he states he is no longer taking it. Pharmacy informed.

## 2021-02-28 DIAGNOSIS — D696 Thrombocytopenia, unspecified: Secondary | ICD-10-CM | POA: Diagnosis not present

## 2021-02-28 DIAGNOSIS — Z7901 Long term (current) use of anticoagulants: Secondary | ICD-10-CM | POA: Diagnosis not present

## 2021-02-28 DIAGNOSIS — I130 Hypertensive heart and chronic kidney disease with heart failure and stage 1 through stage 4 chronic kidney disease, or unspecified chronic kidney disease: Secondary | ICD-10-CM | POA: Diagnosis not present

## 2021-02-28 DIAGNOSIS — E43 Unspecified severe protein-calorie malnutrition: Secondary | ICD-10-CM | POA: Diagnosis not present

## 2021-02-28 DIAGNOSIS — E7800 Pure hypercholesterolemia, unspecified: Secondary | ICD-10-CM | POA: Diagnosis not present

## 2021-02-28 DIAGNOSIS — N184 Chronic kidney disease, stage 4 (severe): Secondary | ICD-10-CM | POA: Diagnosis not present

## 2021-02-28 DIAGNOSIS — R634 Abnormal weight loss: Secondary | ICD-10-CM | POA: Diagnosis not present

## 2021-02-28 DIAGNOSIS — K219 Gastro-esophageal reflux disease without esophagitis: Secondary | ICD-10-CM | POA: Diagnosis not present

## 2021-02-28 DIAGNOSIS — I739 Peripheral vascular disease, unspecified: Secondary | ICD-10-CM | POA: Diagnosis not present

## 2021-02-28 DIAGNOSIS — M10031 Idiopathic gout, right wrist: Secondary | ICD-10-CM | POA: Diagnosis not present

## 2021-02-28 DIAGNOSIS — I5022 Chronic systolic (congestive) heart failure: Secondary | ICD-10-CM | POA: Diagnosis not present

## 2021-02-28 DIAGNOSIS — M1990 Unspecified osteoarthritis, unspecified site: Secondary | ICD-10-CM | POA: Diagnosis not present

## 2021-02-28 DIAGNOSIS — I429 Cardiomyopathy, unspecified: Secondary | ICD-10-CM | POA: Diagnosis not present

## 2021-02-28 DIAGNOSIS — M25571 Pain in right ankle and joints of right foot: Secondary | ICD-10-CM | POA: Diagnosis not present

## 2021-02-28 DIAGNOSIS — I82401 Acute embolism and thrombosis of unspecified deep veins of right lower extremity: Secondary | ICD-10-CM | POA: Diagnosis not present

## 2021-02-28 DIAGNOSIS — N400 Enlarged prostate without lower urinary tract symptoms: Secondary | ICD-10-CM | POA: Diagnosis not present

## 2021-02-28 DIAGNOSIS — Z9181 History of falling: Secondary | ICD-10-CM | POA: Diagnosis not present

## 2021-02-28 DIAGNOSIS — E559 Vitamin D deficiency, unspecified: Secondary | ICD-10-CM | POA: Diagnosis not present

## 2021-02-28 DIAGNOSIS — J45909 Unspecified asthma, uncomplicated: Secondary | ICD-10-CM | POA: Diagnosis not present

## 2021-02-28 DIAGNOSIS — M48061 Spinal stenosis, lumbar region without neurogenic claudication: Secondary | ICD-10-CM | POA: Diagnosis not present

## 2021-02-28 DIAGNOSIS — I2510 Atherosclerotic heart disease of native coronary artery without angina pectoris: Secondary | ICD-10-CM | POA: Diagnosis not present

## 2021-02-28 DIAGNOSIS — F200 Paranoid schizophrenia: Secondary | ICD-10-CM | POA: Diagnosis not present

## 2021-02-28 DIAGNOSIS — R2681 Unsteadiness on feet: Secondary | ICD-10-CM | POA: Diagnosis not present

## 2021-02-28 DIAGNOSIS — F32A Depression, unspecified: Secondary | ICD-10-CM | POA: Diagnosis not present

## 2021-02-28 DIAGNOSIS — F17210 Nicotine dependence, cigarettes, uncomplicated: Secondary | ICD-10-CM | POA: Diagnosis not present

## 2021-03-04 DIAGNOSIS — E876 Hypokalemia: Secondary | ICD-10-CM | POA: Diagnosis not present

## 2021-03-04 DIAGNOSIS — D638 Anemia in other chronic diseases classified elsewhere: Secondary | ICD-10-CM | POA: Diagnosis not present

## 2021-03-04 DIAGNOSIS — I5042 Chronic combined systolic (congestive) and diastolic (congestive) heart failure: Secondary | ICD-10-CM | POA: Diagnosis not present

## 2021-03-04 DIAGNOSIS — E8722 Chronic metabolic acidosis: Secondary | ICD-10-CM | POA: Diagnosis not present

## 2021-03-04 DIAGNOSIS — D696 Thrombocytopenia, unspecified: Secondary | ICD-10-CM | POA: Diagnosis not present

## 2021-03-04 DIAGNOSIS — N184 Chronic kidney disease, stage 4 (severe): Secondary | ICD-10-CM | POA: Diagnosis not present

## 2021-03-04 DIAGNOSIS — I129 Hypertensive chronic kidney disease with stage 1 through stage 4 chronic kidney disease, or unspecified chronic kidney disease: Secondary | ICD-10-CM | POA: Diagnosis not present

## 2021-03-04 DIAGNOSIS — N170 Acute kidney failure with tubular necrosis: Secondary | ICD-10-CM | POA: Diagnosis not present

## 2021-03-06 DIAGNOSIS — N184 Chronic kidney disease, stage 4 (severe): Secondary | ICD-10-CM | POA: Diagnosis not present

## 2021-03-06 DIAGNOSIS — I429 Cardiomyopathy, unspecified: Secondary | ICD-10-CM | POA: Diagnosis not present

## 2021-03-06 DIAGNOSIS — I130 Hypertensive heart and chronic kidney disease with heart failure and stage 1 through stage 4 chronic kidney disease, or unspecified chronic kidney disease: Secondary | ICD-10-CM | POA: Diagnosis not present

## 2021-03-06 DIAGNOSIS — R2681 Unsteadiness on feet: Secondary | ICD-10-CM | POA: Diagnosis not present

## 2021-03-06 DIAGNOSIS — I5022 Chronic systolic (congestive) heart failure: Secondary | ICD-10-CM | POA: Diagnosis not present

## 2021-03-06 DIAGNOSIS — M25571 Pain in right ankle and joints of right foot: Secondary | ICD-10-CM | POA: Diagnosis not present

## 2021-03-08 DIAGNOSIS — I429 Cardiomyopathy, unspecified: Secondary | ICD-10-CM | POA: Diagnosis not present

## 2021-03-08 DIAGNOSIS — N184 Chronic kidney disease, stage 4 (severe): Secondary | ICD-10-CM | POA: Diagnosis not present

## 2021-03-08 DIAGNOSIS — I130 Hypertensive heart and chronic kidney disease with heart failure and stage 1 through stage 4 chronic kidney disease, or unspecified chronic kidney disease: Secondary | ICD-10-CM | POA: Diagnosis not present

## 2021-03-08 DIAGNOSIS — R2681 Unsteadiness on feet: Secondary | ICD-10-CM | POA: Diagnosis not present

## 2021-03-08 DIAGNOSIS — I5022 Chronic systolic (congestive) heart failure: Secondary | ICD-10-CM | POA: Diagnosis not present

## 2021-03-08 DIAGNOSIS — M25571 Pain in right ankle and joints of right foot: Secondary | ICD-10-CM | POA: Diagnosis not present

## 2021-03-11 DIAGNOSIS — I130 Hypertensive heart and chronic kidney disease with heart failure and stage 1 through stage 4 chronic kidney disease, or unspecified chronic kidney disease: Secondary | ICD-10-CM | POA: Diagnosis not present

## 2021-03-11 DIAGNOSIS — N184 Chronic kidney disease, stage 4 (severe): Secondary | ICD-10-CM | POA: Diagnosis not present

## 2021-03-11 DIAGNOSIS — R2681 Unsteadiness on feet: Secondary | ICD-10-CM | POA: Diagnosis not present

## 2021-03-11 DIAGNOSIS — I429 Cardiomyopathy, unspecified: Secondary | ICD-10-CM | POA: Diagnosis not present

## 2021-03-11 DIAGNOSIS — M25571 Pain in right ankle and joints of right foot: Secondary | ICD-10-CM | POA: Diagnosis not present

## 2021-03-11 DIAGNOSIS — I5022 Chronic systolic (congestive) heart failure: Secondary | ICD-10-CM | POA: Diagnosis not present

## 2021-03-13 DIAGNOSIS — I130 Hypertensive heart and chronic kidney disease with heart failure and stage 1 through stage 4 chronic kidney disease, or unspecified chronic kidney disease: Secondary | ICD-10-CM | POA: Diagnosis not present

## 2021-03-13 DIAGNOSIS — R2681 Unsteadiness on feet: Secondary | ICD-10-CM | POA: Diagnosis not present

## 2021-03-13 DIAGNOSIS — I429 Cardiomyopathy, unspecified: Secondary | ICD-10-CM | POA: Diagnosis not present

## 2021-03-13 DIAGNOSIS — N184 Chronic kidney disease, stage 4 (severe): Secondary | ICD-10-CM | POA: Diagnosis not present

## 2021-03-13 DIAGNOSIS — I5022 Chronic systolic (congestive) heart failure: Secondary | ICD-10-CM | POA: Diagnosis not present

## 2021-03-13 DIAGNOSIS — M25571 Pain in right ankle and joints of right foot: Secondary | ICD-10-CM | POA: Diagnosis not present

## 2021-03-15 ENCOUNTER — Other Ambulatory Visit (HOSPITAL_COMMUNITY): Payer: Self-pay | Admitting: Nephrology

## 2021-03-15 ENCOUNTER — Other Ambulatory Visit: Payer: Self-pay | Admitting: Nephrology

## 2021-03-15 DIAGNOSIS — R809 Proteinuria, unspecified: Secondary | ICD-10-CM

## 2021-03-15 DIAGNOSIS — N189 Chronic kidney disease, unspecified: Secondary | ICD-10-CM | POA: Diagnosis not present

## 2021-03-15 DIAGNOSIS — N184 Chronic kidney disease, stage 4 (severe): Secondary | ICD-10-CM

## 2021-03-15 DIAGNOSIS — I129 Hypertensive chronic kidney disease with stage 1 through stage 4 chronic kidney disease, or unspecified chronic kidney disease: Secondary | ICD-10-CM

## 2021-03-15 DIAGNOSIS — E8722 Chronic metabolic acidosis: Secondary | ICD-10-CM | POA: Diagnosis not present

## 2021-03-15 DIAGNOSIS — I5042 Chronic combined systolic (congestive) and diastolic (congestive) heart failure: Secondary | ICD-10-CM | POA: Diagnosis not present

## 2021-03-15 DIAGNOSIS — Z5181 Encounter for therapeutic drug level monitoring: Secondary | ICD-10-CM | POA: Diagnosis not present

## 2021-03-15 DIAGNOSIS — N170 Acute kidney failure with tubular necrosis: Secondary | ICD-10-CM

## 2021-03-15 DIAGNOSIS — I1310 Hypertensive heart and chronic kidney disease without heart failure, with stage 1 through stage 4 chronic kidney disease, or unspecified chronic kidney disease: Secondary | ICD-10-CM | POA: Diagnosis not present

## 2021-03-18 DIAGNOSIS — R2681 Unsteadiness on feet: Secondary | ICD-10-CM | POA: Diagnosis not present

## 2021-03-18 DIAGNOSIS — I130 Hypertensive heart and chronic kidney disease with heart failure and stage 1 through stage 4 chronic kidney disease, or unspecified chronic kidney disease: Secondary | ICD-10-CM | POA: Diagnosis not present

## 2021-03-18 DIAGNOSIS — N184 Chronic kidney disease, stage 4 (severe): Secondary | ICD-10-CM | POA: Diagnosis not present

## 2021-03-18 DIAGNOSIS — I429 Cardiomyopathy, unspecified: Secondary | ICD-10-CM | POA: Diagnosis not present

## 2021-03-18 DIAGNOSIS — I5022 Chronic systolic (congestive) heart failure: Secondary | ICD-10-CM | POA: Diagnosis not present

## 2021-03-18 DIAGNOSIS — M25571 Pain in right ankle and joints of right foot: Secondary | ICD-10-CM | POA: Diagnosis not present

## 2021-03-21 DIAGNOSIS — R2681 Unsteadiness on feet: Secondary | ICD-10-CM | POA: Diagnosis not present

## 2021-03-21 DIAGNOSIS — Z0471 Encounter for examination and observation following alleged adult physical abuse: Secondary | ICD-10-CM | POA: Diagnosis not present

## 2021-03-21 DIAGNOSIS — H1131 Conjunctival hemorrhage, right eye: Secondary | ICD-10-CM | POA: Diagnosis not present

## 2021-03-21 DIAGNOSIS — S0231XA Fracture of orbital floor, right side, initial encounter for closed fracture: Secondary | ICD-10-CM | POA: Diagnosis not present

## 2021-03-21 DIAGNOSIS — N184 Chronic kidney disease, stage 4 (severe): Secondary | ICD-10-CM | POA: Diagnosis not present

## 2021-03-21 DIAGNOSIS — S50311A Abrasion of right elbow, initial encounter: Secondary | ICD-10-CM | POA: Diagnosis not present

## 2021-03-21 DIAGNOSIS — S0240CA Maxillary fracture, right side, initial encounter for closed fracture: Secondary | ICD-10-CM | POA: Diagnosis not present

## 2021-03-21 DIAGNOSIS — R609 Edema, unspecified: Secondary | ICD-10-CM | POA: Diagnosis not present

## 2021-03-21 DIAGNOSIS — S0990XA Unspecified injury of head, initial encounter: Secondary | ICD-10-CM | POA: Diagnosis not present

## 2021-03-21 DIAGNOSIS — S0291XA Unspecified fracture of skull, initial encounter for closed fracture: Secondary | ICD-10-CM | POA: Diagnosis not present

## 2021-03-21 DIAGNOSIS — S50312A Abrasion of left elbow, initial encounter: Secondary | ICD-10-CM | POA: Diagnosis not present

## 2021-03-21 DIAGNOSIS — S80211A Abrasion, right knee, initial encounter: Secondary | ICD-10-CM | POA: Diagnosis not present

## 2021-03-21 DIAGNOSIS — I429 Cardiomyopathy, unspecified: Secondary | ICD-10-CM | POA: Diagnosis not present

## 2021-03-21 DIAGNOSIS — I130 Hypertensive heart and chronic kidney disease with heart failure and stage 1 through stage 4 chronic kidney disease, or unspecified chronic kidney disease: Secondary | ICD-10-CM | POA: Diagnosis not present

## 2021-03-21 DIAGNOSIS — I5022 Chronic systolic (congestive) heart failure: Secondary | ICD-10-CM | POA: Diagnosis not present

## 2021-03-21 DIAGNOSIS — I10 Essential (primary) hypertension: Secondary | ICD-10-CM | POA: Diagnosis not present

## 2021-03-21 DIAGNOSIS — S80212A Abrasion, left knee, initial encounter: Secondary | ICD-10-CM | POA: Diagnosis not present

## 2021-03-21 DIAGNOSIS — M25571 Pain in right ankle and joints of right foot: Secondary | ICD-10-CM | POA: Diagnosis not present

## 2021-03-26 DIAGNOSIS — Z9841 Cataract extraction status, right eye: Secondary | ICD-10-CM | POA: Diagnosis not present

## 2021-03-26 DIAGNOSIS — S0232XD Fracture of orbital floor, left side, subsequent encounter for fracture with routine healing: Secondary | ICD-10-CM | POA: Diagnosis not present

## 2021-03-26 DIAGNOSIS — Z8669 Personal history of other diseases of the nervous system and sense organs: Secondary | ICD-10-CM | POA: Diagnosis not present

## 2021-03-26 DIAGNOSIS — Z7952 Long term (current) use of systemic steroids: Secondary | ICD-10-CM | POA: Diagnosis not present

## 2021-03-26 DIAGNOSIS — H40003 Preglaucoma, unspecified, bilateral: Secondary | ICD-10-CM | POA: Diagnosis not present

## 2021-03-26 DIAGNOSIS — S0231XD Fracture of orbital floor, right side, subsequent encounter for fracture with routine healing: Secondary | ICD-10-CM | POA: Diagnosis not present

## 2021-03-26 DIAGNOSIS — Z79899 Other long term (current) drug therapy: Secondary | ICD-10-CM | POA: Diagnosis not present

## 2021-03-26 DIAGNOSIS — H02403 Unspecified ptosis of bilateral eyelids: Secondary | ICD-10-CM | POA: Diagnosis not present

## 2021-03-26 DIAGNOSIS — Z961 Presence of intraocular lens: Secondary | ICD-10-CM | POA: Diagnosis not present

## 2021-03-27 ENCOUNTER — Ambulatory Visit (HOSPITAL_COMMUNITY): Payer: Medicare Other

## 2021-03-27 DIAGNOSIS — I5042 Chronic combined systolic (congestive) and diastolic (congestive) heart failure: Secondary | ICD-10-CM | POA: Diagnosis not present

## 2021-03-27 DIAGNOSIS — I130 Hypertensive heart and chronic kidney disease with heart failure and stage 1 through stage 4 chronic kidney disease, or unspecified chronic kidney disease: Secondary | ICD-10-CM | POA: Diagnosis not present

## 2021-03-27 DIAGNOSIS — I129 Hypertensive chronic kidney disease with stage 1 through stage 4 chronic kidney disease, or unspecified chronic kidney disease: Secondary | ICD-10-CM | POA: Diagnosis not present

## 2021-03-27 DIAGNOSIS — N189 Chronic kidney disease, unspecified: Secondary | ICD-10-CM | POA: Diagnosis not present

## 2021-03-27 DIAGNOSIS — N184 Chronic kidney disease, stage 4 (severe): Secondary | ICD-10-CM | POA: Diagnosis not present

## 2021-03-27 DIAGNOSIS — R809 Proteinuria, unspecified: Secondary | ICD-10-CM | POA: Diagnosis not present

## 2021-03-27 DIAGNOSIS — E8722 Chronic metabolic acidosis: Secondary | ICD-10-CM | POA: Diagnosis not present

## 2021-03-27 DIAGNOSIS — N170 Acute kidney failure with tubular necrosis: Secondary | ICD-10-CM | POA: Diagnosis not present

## 2021-03-27 DIAGNOSIS — I1310 Hypertensive heart and chronic kidney disease without heart failure, with stage 1 through stage 4 chronic kidney disease, or unspecified chronic kidney disease: Secondary | ICD-10-CM | POA: Diagnosis not present

## 2021-03-28 DIAGNOSIS — I5022 Chronic systolic (congestive) heart failure: Secondary | ICD-10-CM | POA: Diagnosis not present

## 2021-03-28 DIAGNOSIS — N184 Chronic kidney disease, stage 4 (severe): Secondary | ICD-10-CM | POA: Diagnosis not present

## 2021-03-28 DIAGNOSIS — R2681 Unsteadiness on feet: Secondary | ICD-10-CM | POA: Diagnosis not present

## 2021-03-28 DIAGNOSIS — I130 Hypertensive heart and chronic kidney disease with heart failure and stage 1 through stage 4 chronic kidney disease, or unspecified chronic kidney disease: Secondary | ICD-10-CM | POA: Diagnosis not present

## 2021-03-28 DIAGNOSIS — M25571 Pain in right ankle and joints of right foot: Secondary | ICD-10-CM | POA: Diagnosis not present

## 2021-03-28 DIAGNOSIS — I429 Cardiomyopathy, unspecified: Secondary | ICD-10-CM | POA: Diagnosis not present

## 2021-03-30 DIAGNOSIS — E7800 Pure hypercholesterolemia, unspecified: Secondary | ICD-10-CM | POA: Diagnosis not present

## 2021-03-30 DIAGNOSIS — M1990 Unspecified osteoarthritis, unspecified site: Secondary | ICD-10-CM | POA: Diagnosis not present

## 2021-03-30 DIAGNOSIS — N184 Chronic kidney disease, stage 4 (severe): Secondary | ICD-10-CM | POA: Diagnosis not present

## 2021-03-30 DIAGNOSIS — I82401 Acute embolism and thrombosis of unspecified deep veins of right lower extremity: Secondary | ICD-10-CM | POA: Diagnosis not present

## 2021-03-30 DIAGNOSIS — E559 Vitamin D deficiency, unspecified: Secondary | ICD-10-CM | POA: Diagnosis not present

## 2021-03-30 DIAGNOSIS — I739 Peripheral vascular disease, unspecified: Secondary | ICD-10-CM | POA: Diagnosis not present

## 2021-03-30 DIAGNOSIS — Z7901 Long term (current) use of anticoagulants: Secondary | ICD-10-CM | POA: Diagnosis not present

## 2021-03-30 DIAGNOSIS — N400 Enlarged prostate without lower urinary tract symptoms: Secondary | ICD-10-CM | POA: Diagnosis not present

## 2021-03-30 DIAGNOSIS — I429 Cardiomyopathy, unspecified: Secondary | ICD-10-CM | POA: Diagnosis not present

## 2021-03-30 DIAGNOSIS — M25571 Pain in right ankle and joints of right foot: Secondary | ICD-10-CM | POA: Diagnosis not present

## 2021-03-30 DIAGNOSIS — F32A Depression, unspecified: Secondary | ICD-10-CM | POA: Diagnosis not present

## 2021-03-30 DIAGNOSIS — K219 Gastro-esophageal reflux disease without esophagitis: Secondary | ICD-10-CM | POA: Diagnosis not present

## 2021-03-30 DIAGNOSIS — D696 Thrombocytopenia, unspecified: Secondary | ICD-10-CM | POA: Diagnosis not present

## 2021-03-30 DIAGNOSIS — Z9181 History of falling: Secondary | ICD-10-CM | POA: Diagnosis not present

## 2021-03-30 DIAGNOSIS — I5022 Chronic systolic (congestive) heart failure: Secondary | ICD-10-CM | POA: Diagnosis not present

## 2021-03-30 DIAGNOSIS — I2510 Atherosclerotic heart disease of native coronary artery without angina pectoris: Secondary | ICD-10-CM | POA: Diagnosis not present

## 2021-03-30 DIAGNOSIS — F200 Paranoid schizophrenia: Secondary | ICD-10-CM | POA: Diagnosis not present

## 2021-03-30 DIAGNOSIS — J45909 Unspecified asthma, uncomplicated: Secondary | ICD-10-CM | POA: Diagnosis not present

## 2021-03-30 DIAGNOSIS — R2681 Unsteadiness on feet: Secondary | ICD-10-CM | POA: Diagnosis not present

## 2021-03-30 DIAGNOSIS — I130 Hypertensive heart and chronic kidney disease with heart failure and stage 1 through stage 4 chronic kidney disease, or unspecified chronic kidney disease: Secondary | ICD-10-CM | POA: Diagnosis not present

## 2021-03-30 DIAGNOSIS — M10031 Idiopathic gout, right wrist: Secondary | ICD-10-CM | POA: Diagnosis not present

## 2021-03-30 DIAGNOSIS — E43 Unspecified severe protein-calorie malnutrition: Secondary | ICD-10-CM | POA: Diagnosis not present

## 2021-03-30 DIAGNOSIS — R634 Abnormal weight loss: Secondary | ICD-10-CM | POA: Diagnosis not present

## 2021-03-30 DIAGNOSIS — F17210 Nicotine dependence, cigarettes, uncomplicated: Secondary | ICD-10-CM | POA: Diagnosis not present

## 2021-03-30 DIAGNOSIS — M48061 Spinal stenosis, lumbar region without neurogenic claudication: Secondary | ICD-10-CM | POA: Diagnosis not present

## 2021-04-01 ENCOUNTER — Other Ambulatory Visit: Payer: Self-pay

## 2021-04-01 ENCOUNTER — Ambulatory Visit (HOSPITAL_COMMUNITY)
Admission: RE | Admit: 2021-04-01 | Discharge: 2021-04-01 | Disposition: A | Discharge status: Home / Self Care | Payer: Medicare Other | Source: Ambulatory Visit | Attending: Nephrology | Admitting: Nephrology

## 2021-04-01 DIAGNOSIS — N170 Acute kidney failure with tubular necrosis: Secondary | ICD-10-CM | POA: Diagnosis not present

## 2021-04-01 DIAGNOSIS — N184 Chronic kidney disease, stage 4 (severe): Secondary | ICD-10-CM | POA: Diagnosis not present

## 2021-04-01 DIAGNOSIS — N281 Cyst of kidney, acquired: Secondary | ICD-10-CM | POA: Diagnosis not present

## 2021-04-01 DIAGNOSIS — N189 Chronic kidney disease, unspecified: Secondary | ICD-10-CM | POA: Diagnosis not present

## 2021-04-01 DIAGNOSIS — I129 Hypertensive chronic kidney disease with stage 1 through stage 4 chronic kidney disease, or unspecified chronic kidney disease: Secondary | ICD-10-CM | POA: Diagnosis not present

## 2021-04-01 DIAGNOSIS — R809 Proteinuria, unspecified: Secondary | ICD-10-CM | POA: Insufficient documentation

## 2021-04-01 DIAGNOSIS — N179 Acute kidney failure, unspecified: Secondary | ICD-10-CM | POA: Diagnosis not present

## 2021-04-02 DIAGNOSIS — I5042 Chronic combined systolic (congestive) and diastolic (congestive) heart failure: Secondary | ICD-10-CM | POA: Diagnosis not present

## 2021-04-02 DIAGNOSIS — I129 Hypertensive chronic kidney disease with stage 1 through stage 4 chronic kidney disease, or unspecified chronic kidney disease: Secondary | ICD-10-CM | POA: Diagnosis not present

## 2021-04-02 DIAGNOSIS — N170 Acute kidney failure with tubular necrosis: Secondary | ICD-10-CM | POA: Diagnosis not present

## 2021-04-02 DIAGNOSIS — E876 Hypokalemia: Secondary | ICD-10-CM | POA: Diagnosis not present

## 2021-04-02 DIAGNOSIS — N184 Chronic kidney disease, stage 4 (severe): Secondary | ICD-10-CM | POA: Diagnosis not present

## 2021-04-02 DIAGNOSIS — R809 Proteinuria, unspecified: Secondary | ICD-10-CM | POA: Diagnosis not present

## 2021-04-04 DIAGNOSIS — I5022 Chronic systolic (congestive) heart failure: Secondary | ICD-10-CM | POA: Diagnosis not present

## 2021-04-04 DIAGNOSIS — I429 Cardiomyopathy, unspecified: Secondary | ICD-10-CM | POA: Diagnosis not present

## 2021-04-04 DIAGNOSIS — R2681 Unsteadiness on feet: Secondary | ICD-10-CM | POA: Diagnosis not present

## 2021-04-04 DIAGNOSIS — M25571 Pain in right ankle and joints of right foot: Secondary | ICD-10-CM | POA: Diagnosis not present

## 2021-04-04 DIAGNOSIS — I130 Hypertensive heart and chronic kidney disease with heart failure and stage 1 through stage 4 chronic kidney disease, or unspecified chronic kidney disease: Secondary | ICD-10-CM | POA: Diagnosis not present

## 2021-04-04 DIAGNOSIS — N184 Chronic kidney disease, stage 4 (severe): Secondary | ICD-10-CM | POA: Diagnosis not present

## 2021-04-08 DIAGNOSIS — Z299 Encounter for prophylactic measures, unspecified: Secondary | ICD-10-CM | POA: Diagnosis not present

## 2021-04-08 DIAGNOSIS — R3915 Urgency of urination: Secondary | ICD-10-CM | POA: Diagnosis not present

## 2021-04-08 DIAGNOSIS — N400 Enlarged prostate without lower urinary tract symptoms: Secondary | ICD-10-CM | POA: Diagnosis not present

## 2021-04-08 DIAGNOSIS — Z6826 Body mass index (BMI) 26.0-26.9, adult: Secondary | ICD-10-CM | POA: Diagnosis not present

## 2021-04-08 DIAGNOSIS — I10 Essential (primary) hypertension: Secondary | ICD-10-CM | POA: Diagnosis not present

## 2021-04-08 DIAGNOSIS — N390 Urinary tract infection, site not specified: Secondary | ICD-10-CM | POA: Diagnosis not present

## 2021-04-08 DIAGNOSIS — Z87891 Personal history of nicotine dependence: Secondary | ICD-10-CM | POA: Diagnosis not present

## 2021-04-12 DIAGNOSIS — R2681 Unsteadiness on feet: Secondary | ICD-10-CM | POA: Diagnosis not present

## 2021-04-12 DIAGNOSIS — I429 Cardiomyopathy, unspecified: Secondary | ICD-10-CM | POA: Diagnosis not present

## 2021-04-12 DIAGNOSIS — I5022 Chronic systolic (congestive) heart failure: Secondary | ICD-10-CM | POA: Diagnosis not present

## 2021-04-12 DIAGNOSIS — N184 Chronic kidney disease, stage 4 (severe): Secondary | ICD-10-CM | POA: Diagnosis not present

## 2021-04-12 DIAGNOSIS — I130 Hypertensive heart and chronic kidney disease with heart failure and stage 1 through stage 4 chronic kidney disease, or unspecified chronic kidney disease: Secondary | ICD-10-CM | POA: Diagnosis not present

## 2021-04-12 DIAGNOSIS — M25571 Pain in right ankle and joints of right foot: Secondary | ICD-10-CM | POA: Diagnosis not present

## 2021-04-17 DIAGNOSIS — I5022 Chronic systolic (congestive) heart failure: Secondary | ICD-10-CM | POA: Diagnosis not present

## 2021-04-17 DIAGNOSIS — N170 Acute kidney failure with tubular necrosis: Secondary | ICD-10-CM | POA: Diagnosis not present

## 2021-04-17 DIAGNOSIS — I130 Hypertensive heart and chronic kidney disease with heart failure and stage 1 through stage 4 chronic kidney disease, or unspecified chronic kidney disease: Secondary | ICD-10-CM | POA: Diagnosis not present

## 2021-04-17 DIAGNOSIS — I429 Cardiomyopathy, unspecified: Secondary | ICD-10-CM | POA: Diagnosis not present

## 2021-04-17 DIAGNOSIS — R2681 Unsteadiness on feet: Secondary | ICD-10-CM | POA: Diagnosis not present

## 2021-04-17 DIAGNOSIS — N184 Chronic kidney disease, stage 4 (severe): Secondary | ICD-10-CM | POA: Diagnosis not present

## 2021-04-17 DIAGNOSIS — I5042 Chronic combined systolic (congestive) and diastolic (congestive) heart failure: Secondary | ICD-10-CM | POA: Diagnosis not present

## 2021-04-17 DIAGNOSIS — R809 Proteinuria, unspecified: Secondary | ICD-10-CM | POA: Diagnosis not present

## 2021-04-17 DIAGNOSIS — E876 Hypokalemia: Secondary | ICD-10-CM | POA: Diagnosis not present

## 2021-04-17 DIAGNOSIS — M25571 Pain in right ankle and joints of right foot: Secondary | ICD-10-CM | POA: Diagnosis not present

## 2021-04-17 DIAGNOSIS — I129 Hypertensive chronic kidney disease with stage 1 through stage 4 chronic kidney disease, or unspecified chronic kidney disease: Secondary | ICD-10-CM | POA: Diagnosis not present

## 2021-04-23 DIAGNOSIS — I429 Cardiomyopathy, unspecified: Secondary | ICD-10-CM | POA: Diagnosis not present

## 2021-04-23 DIAGNOSIS — R2681 Unsteadiness on feet: Secondary | ICD-10-CM | POA: Diagnosis not present

## 2021-04-23 DIAGNOSIS — I130 Hypertensive heart and chronic kidney disease with heart failure and stage 1 through stage 4 chronic kidney disease, or unspecified chronic kidney disease: Secondary | ICD-10-CM | POA: Diagnosis not present

## 2021-04-23 DIAGNOSIS — I5022 Chronic systolic (congestive) heart failure: Secondary | ICD-10-CM | POA: Diagnosis not present

## 2021-04-23 DIAGNOSIS — N184 Chronic kidney disease, stage 4 (severe): Secondary | ICD-10-CM | POA: Diagnosis not present

## 2021-04-23 DIAGNOSIS — M25571 Pain in right ankle and joints of right foot: Secondary | ICD-10-CM | POA: Diagnosis not present

## 2021-04-29 DIAGNOSIS — J069 Acute upper respiratory infection, unspecified: Secondary | ICD-10-CM | POA: Diagnosis not present

## 2021-04-29 DIAGNOSIS — Z87891 Personal history of nicotine dependence: Secondary | ICD-10-CM | POA: Diagnosis not present

## 2021-04-29 DIAGNOSIS — F200 Paranoid schizophrenia: Secondary | ICD-10-CM | POA: Diagnosis not present

## 2021-04-29 DIAGNOSIS — I5022 Chronic systolic (congestive) heart failure: Secondary | ICD-10-CM | POA: Diagnosis not present

## 2021-04-29 DIAGNOSIS — Z299 Encounter for prophylactic measures, unspecified: Secondary | ICD-10-CM | POA: Diagnosis not present

## 2021-04-29 DIAGNOSIS — I10 Essential (primary) hypertension: Secondary | ICD-10-CM | POA: Diagnosis not present

## 2021-05-07 NOTE — Progress Notes (Deleted)
? ? ?Referring Provider: Glenda Chroman, MD ?Primary Care Physician:  Glenda Chroman, MD ?Primary GI Physician: Dr. Abbey Chatters ? ?No chief complaint on file. ? ? ?HPI:   ?Glenn Hawkins is a 66 y.o. male with history of CKD, CAD, MI in 2013, ischemic cardiomyopathy, heart failure, PAD with claudication, history of DVT chronically on Eliquis, schizophrenia, reflux esophagitis, adenomatous colon polyps presenting today for follow-up.  ? ?Last seen in our office 11/08/2020 for unintentional weight loss, indigestion, abdominal pain, diarrhea, elevated ammonia, mental status changes, vascular abnormalities on outside CT, and pancreatic mass.  He had documented 51 pound weight loss over the last year.  Reported loss of appetite.  Mom passed in December 2021 and had previously been living with her.  He was living alone and rarely cooked.  Sister previously came to cook for them, but this was occurring much less frequently.  Stated he had no initiative to get up and get the food that was available however.  He had also had episodes of paranoia/hallucinations where he reported seeing maggots and bugs in his food, snakes and mice under the sofa which had affected his eating. No hallucinations recently.  Admitted to occasional postprandial mid/upper abdominal pain a few times a week, intermittent indigestion, previously improved with PPI, but had not been taking this.  4-5 bowel movements per day that could be watery to formed, but more trouble with diarrhea.  Occasional nocturnal stools.  Had history of C. difficile in June 2022.  No GI bleeding.  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  Notably, it was very difficult to get a clear history from the patient as he was somewhat lethargic, unsteady gait, and with droopy eyes.  Patient sister reported he was close to baseline, but noted similar symptoms previously when his medications were "messed up".  ? ?Reviewed  outside records and imaging. Mildly elevated ammonia, mildly elevated liver enzymes, mild anemia, thrombocytopenia, elevated creatinine, low normal TSH and low free T4.  Noncontrast CT chest abdomen and pelvis revealing, scattered small solid pulmonary nodules measuring less than 6 mm, normal liver and spleen, 1.1 cm indeterminate left adrenal nodule, focal area of  calcification within the pancreatic body with possible underlying  hypoattenuating lesion, severe narrowing of the proximal bilateral common iliac arteries Ill-defined stranding and soft tissue within the retroperitoneum with medial deviation of the proximal ureters suspicious for retroperitoneal fibrosis.   ? ?Due to CKD, unable to pursue dedicated pancreatic imaging.  Planned to reach out to [facility] to discuss possible EUS to evaluate pancreatic lesion.  He was started on Nexium 20 mg daily.  For diarrhea, planned to check celiac serologies, update thyroid function, check stool studies, and arrange for surveillance colonoscopy.  Suspected weight loss was multifactorial in the setting of depression, paranoia, abdominal pain, diarrhea, could not rule out malignancy in the setting of pancreatic and pulmonary lesion.  His previously elevated ammonia was of unknown etiology.  Queried whether elevated creatinine with elevated BUN along with polypharmacy to be contributing to mental status changes.  Planned to recheck basic labs and ammonia.  Needed follow-up with nephrology. ? ?Stool studies were negative.  Celiac screen negative, TSH within normal limits, hemoglobin 12.3, platelets 92, creatinine 3.96, potassium 3.3, sodium 142, LFTs within normal limits.  Ammonia elevated at 78.  Prescription was sent to correct his potassium.  He was started on lactulose 15 mL twice daily due to  elevated ammonia. ? ?Case was discussed with Dr. Rush Landmark who recommended MRI abdomen without contrast with diffusion imaging to further characterize pancreatic lesion as  well as other findings in his adrenal region and ?  Retroperitoneal fibrosis. ? ?MRI 11/20/2020 with no appreciable pancreatic lesion, normal-appearing liver, normal adrenal glands, bilateral benign-appearing renal cyst.  As there were no pancreatic abnormalities on MRI, Dr. Ardis Hughs recommended canceling EUS. ? ?I also reviewed prior outside CT with Dr. Thornton Papas who stated the calcifications at his pancreas that were previously seen may be secondary to vascular calcifications rather than within the pancreas. ? ?Follow-up ultrasound with elastography with fatty liver, K PA 4.9.  Again unclear why ammonia was elevated.  Query whether this was related to CKD or other unknown etiology.  Recommended follow-up with PCP and nephrology. ? ?EGD and colonoscopy 12/17/2020: ?EGD: Grade a reflux esophagitis, gastritis biopsied, normal examined duodenum.  Pathology with reactive gastropathy.  No H. pylori. ?Colonoscopy: Nonbleeding internal hemorrhoids, otherwise normal exam. ? ? ?Today: ? ? ?Anemia:  ?Hgb 11.01 Apr 2021 with iron panel within normal limits. Baseline 11-12 range.  ? ?Past Medical History:  ?Diagnosis Date  ? Aortic insufficiency   ? a. 08/2019 Echo: mild to mod AI.  ? C. difficile diarrhea 07/2020  ? CKD (chronic kidney disease) stage 3, GFR 30-59 ml/min (HCC)   ? baseline Cr around 2.5  ? Coronary atherosclerosis of native coronary artery   ? a. 02/2011 Late presentation MI-->Myoview w/ large area of transmural infarct in LCX distribution, no ischemia.  ? Depressive disorder, not elsewhere classified   ? Esophageal reflux   ? Gout, unspecified   ? Hemorrhoids   ? a. 01/2016 s/p hemorrhoidectomy.  ? HFrEF (heart failure with reduced ejection fraction) ([place])   ? a. 04/2012 Echo: EF 40%, inflat AK, Gr1 DD, mild AI/MR, triv TR; b. 11/2017 EchoP EF 30-35%, inflat HK; c. 08/2019 Echo: EF 30-35%, gr1 DD, inflat AK.  ? History of DVT (deep vein thrombosis)   ? a. Chronic Eliquis.  ? Hyperlipidemia LDL goal <70   ? Hypertension    ? Hypotension, unspecified   ? Ischemic cardiomyopathy   ? a. 04/2012 Echo: EF 40%, inflat AK, Gr1 DD, mild AI/MR, triv TR; b. 11/2017 EchoP EF 30-35%, inflat HK; c. 08/2019 Echo: EF 30-35%, gr1 DD, inflat AK. Nl RV size/fxn. Mild MR. Mild to mod AI.  ? Mitral valve disorders(424.0)   ? a. 04/2012 Echo: EF 40%, mild MR.  ? Myocardial infarction (lateral wall) ([place]) 2013  ? a. 02/2011 - late presentation. Managed conservatively 2/2 CKD;  b. 02/2011 Myoview: Large transmural infarct in the LCX distribution, no ischemia.  ? PAD (peripheral artery disease) ([place])   ? a. 08/2015 ABI/duplex: R: 0.86, L 0.96. Duplex w/ bilateral heterogeneous plaque in mid fem arteries, no significant stenoses; b. 08/2019 ABI/Duplex: prob bilat inflow dzs w/ stable ABIs (R 0.85, L 0.93).  ? Personal history of tobacco use, presenting hazards to health   ? Torn ligament   ? Unspecified schizophrenia, unspecified condition   ? ? ?Past Surgical History:  ?Procedure Laterality Date  ? BIOPSY  12/17/2020  ? Procedure: BIOPSY;  Surgeon: Eloise Harman, DO;  [facility];  Service: Endoscopy;;  ? COLONOSCOPY    ? COLONOSCOPY N/A 11/10/2014  ? Procedure: COLONOSCOPY;  Surgeon: Manus Gunning, MD;  Location: [facility] ENDOSCOPY;  Service: Gastroenterology;  Laterality: N/A;  ? COLONOSCOPY WITH PROPOFOL N/A 05/08/2015  ? Surgeon: [name], MD;  nonthrombosed external hemorrhoids, one 8 mm tubular adenoma, one 4 mm tubular adenoma.  Repeat colonoscopy in 5-10 years.  ? COLONOSCOPY WITH PROPOFOL N/A 12/17/2020  ? Procedure: COLONOSCOPY WITH PROPOFOL;  Surgeon: Eloise Harman, DO;  Location: [facility];  Service: Endoscopy;  Laterality: N/A;  8:30am  ? ESOPHAGOGASTRODUODENOSCOPY (EGD) WITH PROPOFOL N/A 05/08/2015  ? Surgeon: [name], MD; LA grade a reflux esophagitis, normal stomach and duodenum.  ? ESOPHAGOGASTRODUODENOSCOPY (EGD) WITH PROPOFOL N/A 12/17/2020  ? Procedure: ESOPHAGOGASTRODUODENOSCOPY (EGD) WITH PROPOFOL;   Surgeon: Eloise Harman, DO;  Location: [facility];  Service: Endoscopy;  Laterality: N/A;  ? EYE SURGERY Right   ? removal of foreign body  ? HEMORRHOID SURGERY N/A 02/20/2016  ? Procedure: EXTENSIVE HEMORR

## 2021-05-08 ENCOUNTER — Encounter: Payer: Self-pay | Admitting: Internal Medicine

## 2021-05-08 ENCOUNTER — Ambulatory Visit: Payer: Medicare Other | Admitting: Gastroenterology

## 2021-05-08 DIAGNOSIS — N170 Acute kidney failure with tubular necrosis: Secondary | ICD-10-CM | POA: Diagnosis not present

## 2021-05-08 DIAGNOSIS — E876 Hypokalemia: Secondary | ICD-10-CM | POA: Diagnosis not present

## 2021-05-08 DIAGNOSIS — N184 Chronic kidney disease, stage 4 (severe): Secondary | ICD-10-CM | POA: Diagnosis not present

## 2021-05-08 DIAGNOSIS — I129 Hypertensive chronic kidney disease with stage 1 through stage 4 chronic kidney disease, or unspecified chronic kidney disease: Secondary | ICD-10-CM | POA: Diagnosis not present

## 2021-05-08 DIAGNOSIS — I5042 Chronic combined systolic (congestive) and diastolic (congestive) heart failure: Secondary | ICD-10-CM | POA: Diagnosis not present

## 2021-05-08 DIAGNOSIS — R809 Proteinuria, unspecified: Secondary | ICD-10-CM | POA: Diagnosis not present

## 2021-05-16 DIAGNOSIS — R809 Proteinuria, unspecified: Secondary | ICD-10-CM | POA: Diagnosis not present

## 2021-05-16 DIAGNOSIS — N189 Chronic kidney disease, unspecified: Secondary | ICD-10-CM | POA: Diagnosis not present

## 2021-05-16 DIAGNOSIS — E211 Secondary hyperparathyroidism, not elsewhere classified: Secondary | ICD-10-CM | POA: Diagnosis not present

## 2021-05-16 DIAGNOSIS — N184 Chronic kidney disease, stage 4 (severe): Secondary | ICD-10-CM | POA: Diagnosis not present

## 2021-05-16 DIAGNOSIS — N170 Acute kidney failure with tubular necrosis: Secondary | ICD-10-CM | POA: Diagnosis not present

## 2021-05-16 DIAGNOSIS — I5042 Chronic combined systolic (congestive) and diastolic (congestive) heart failure: Secondary | ICD-10-CM | POA: Diagnosis not present

## 2021-05-16 DIAGNOSIS — I1310 Hypertensive heart and chronic kidney disease without heart failure, with stage 1 through stage 4 chronic kidney disease, or unspecified chronic kidney disease: Secondary | ICD-10-CM | POA: Diagnosis not present

## 2021-05-16 DIAGNOSIS — E876 Hypokalemia: Secondary | ICD-10-CM | POA: Diagnosis not present

## 2021-05-16 DIAGNOSIS — I129 Hypertensive chronic kidney disease with stage 1 through stage 4 chronic kidney disease, or unspecified chronic kidney disease: Secondary | ICD-10-CM | POA: Diagnosis not present

## 2021-05-23 DIAGNOSIS — E876 Hypokalemia: Secondary | ICD-10-CM | POA: Diagnosis not present

## 2021-05-23 DIAGNOSIS — E211 Secondary hyperparathyroidism, not elsewhere classified: Secondary | ICD-10-CM | POA: Diagnosis not present

## 2021-05-23 DIAGNOSIS — I1310 Hypertensive heart and chronic kidney disease without heart failure, with stage 1 through stage 4 chronic kidney disease, or unspecified chronic kidney disease: Secondary | ICD-10-CM | POA: Diagnosis not present

## 2021-05-23 DIAGNOSIS — R809 Proteinuria, unspecified: Secondary | ICD-10-CM | POA: Diagnosis not present

## 2021-05-23 DIAGNOSIS — N189 Chronic kidney disease, unspecified: Secondary | ICD-10-CM | POA: Diagnosis not present

## 2021-05-23 DIAGNOSIS — N184 Chronic kidney disease, stage 4 (severe): Secondary | ICD-10-CM | POA: Diagnosis not present

## 2021-05-23 DIAGNOSIS — N170 Acute kidney failure with tubular necrosis: Secondary | ICD-10-CM | POA: Diagnosis not present

## 2021-05-23 DIAGNOSIS — I129 Hypertensive chronic kidney disease with stage 1 through stage 4 chronic kidney disease, or unspecified chronic kidney disease: Secondary | ICD-10-CM | POA: Diagnosis not present

## 2021-05-23 DIAGNOSIS — I5042 Chronic combined systolic (congestive) and diastolic (congestive) heart failure: Secondary | ICD-10-CM | POA: Diagnosis not present

## 2021-06-11 DIAGNOSIS — I1310 Hypertensive heart and chronic kidney disease without heart failure, with stage 1 through stage 4 chronic kidney disease, or unspecified chronic kidney disease: Secondary | ICD-10-CM | POA: Diagnosis not present

## 2021-06-11 DIAGNOSIS — R809 Proteinuria, unspecified: Secondary | ICD-10-CM | POA: Diagnosis not present

## 2021-06-11 DIAGNOSIS — E8722 Chronic metabolic acidosis: Secondary | ICD-10-CM | POA: Diagnosis not present

## 2021-06-11 DIAGNOSIS — Z5181 Encounter for therapeutic drug level monitoring: Secondary | ICD-10-CM | POA: Diagnosis not present

## 2021-06-11 DIAGNOSIS — Z79899 Other long term (current) drug therapy: Secondary | ICD-10-CM | POA: Diagnosis not present

## 2021-06-11 DIAGNOSIS — N189 Chronic kidney disease, unspecified: Secondary | ICD-10-CM | POA: Diagnosis not present

## 2021-06-11 DIAGNOSIS — I129 Hypertensive chronic kidney disease with stage 1 through stage 4 chronic kidney disease, or unspecified chronic kidney disease: Secondary | ICD-10-CM | POA: Diagnosis not present

## 2021-06-11 DIAGNOSIS — N184 Chronic kidney disease, stage 4 (severe): Secondary | ICD-10-CM | POA: Diagnosis not present

## 2021-06-11 DIAGNOSIS — N170 Acute kidney failure with tubular necrosis: Secondary | ICD-10-CM | POA: Diagnosis not present

## 2021-06-20 DIAGNOSIS — N189 Chronic kidney disease, unspecified: Secondary | ICD-10-CM | POA: Diagnosis not present

## 2021-06-20 DIAGNOSIS — N184 Chronic kidney disease, stage 4 (severe): Secondary | ICD-10-CM | POA: Diagnosis not present

## 2021-06-20 DIAGNOSIS — R809 Proteinuria, unspecified: Secondary | ICD-10-CM | POA: Diagnosis not present

## 2021-06-20 DIAGNOSIS — E211 Secondary hyperparathyroidism, not elsewhere classified: Secondary | ICD-10-CM | POA: Diagnosis not present

## 2021-06-20 DIAGNOSIS — I5042 Chronic combined systolic (congestive) and diastolic (congestive) heart failure: Secondary | ICD-10-CM | POA: Diagnosis not present

## 2021-06-20 DIAGNOSIS — E790 Hyperuricemia without signs of inflammatory arthritis and tophaceous disease: Secondary | ICD-10-CM | POA: Diagnosis not present

## 2021-06-20 DIAGNOSIS — D638 Anemia in other chronic diseases classified elsewhere: Secondary | ICD-10-CM | POA: Diagnosis not present

## 2021-06-20 DIAGNOSIS — I1310 Hypertensive heart and chronic kidney disease without heart failure, with stage 1 through stage 4 chronic kidney disease, or unspecified chronic kidney disease: Secondary | ICD-10-CM | POA: Diagnosis not present

## 2021-06-20 DIAGNOSIS — E8722 Chronic metabolic acidosis: Secondary | ICD-10-CM | POA: Diagnosis not present

## 2021-06-20 DIAGNOSIS — N170 Acute kidney failure with tubular necrosis: Secondary | ICD-10-CM | POA: Diagnosis not present

## 2021-06-20 DIAGNOSIS — I129 Hypertensive chronic kidney disease with stage 1 through stage 4 chronic kidney disease, or unspecified chronic kidney disease: Secondary | ICD-10-CM | POA: Diagnosis not present

## 2021-06-20 DIAGNOSIS — D696 Thrombocytopenia, unspecified: Secondary | ICD-10-CM | POA: Diagnosis not present

## 2021-07-02 DIAGNOSIS — N184 Chronic kidney disease, stage 4 (severe): Secondary | ICD-10-CM | POA: Diagnosis not present

## 2021-07-02 DIAGNOSIS — I10 Essential (primary) hypertension: Secondary | ICD-10-CM | POA: Diagnosis not present

## 2021-07-20 DIAGNOSIS — R531 Weakness: Secondary | ICD-10-CM | POA: Diagnosis not present

## 2021-07-20 DIAGNOSIS — M791 Myalgia, unspecified site: Secondary | ICD-10-CM | POA: Diagnosis not present

## 2021-07-20 DIAGNOSIS — N189 Chronic kidney disease, unspecified: Secondary | ICD-10-CM | POA: Diagnosis not present

## 2021-07-20 DIAGNOSIS — Z20822 Contact with and (suspected) exposure to covid-19: Secondary | ICD-10-CM | POA: Diagnosis not present

## 2021-07-20 DIAGNOSIS — Z888 Allergy status to other drugs, medicaments and biological substances status: Secondary | ICD-10-CM | POA: Diagnosis not present

## 2021-07-20 DIAGNOSIS — I129 Hypertensive chronic kidney disease with stage 1 through stage 4 chronic kidney disease, or unspecified chronic kidney disease: Secondary | ICD-10-CM | POA: Diagnosis not present

## 2021-07-20 DIAGNOSIS — M7918 Myalgia, other site: Secondary | ICD-10-CM | POA: Diagnosis not present

## 2021-07-20 DIAGNOSIS — R42 Dizziness and giddiness: Secondary | ICD-10-CM | POA: Diagnosis not present

## 2021-07-20 DIAGNOSIS — R4182 Altered mental status, unspecified: Secondary | ICD-10-CM | POA: Diagnosis not present

## 2021-07-20 DIAGNOSIS — Z79899 Other long term (current) drug therapy: Secondary | ICD-10-CM | POA: Diagnosis not present

## 2021-08-20 DIAGNOSIS — N170 Acute kidney failure with tubular necrosis: Secondary | ICD-10-CM | POA: Diagnosis not present

## 2021-08-20 DIAGNOSIS — E8722 Chronic metabolic acidosis: Secondary | ICD-10-CM | POA: Diagnosis not present

## 2021-08-20 DIAGNOSIS — I1310 Hypertensive heart and chronic kidney disease without heart failure, with stage 1 through stage 4 chronic kidney disease, or unspecified chronic kidney disease: Secondary | ICD-10-CM | POA: Diagnosis not present

## 2021-08-20 DIAGNOSIS — I129 Hypertensive chronic kidney disease with stage 1 through stage 4 chronic kidney disease, or unspecified chronic kidney disease: Secondary | ICD-10-CM | POA: Diagnosis not present

## 2021-08-20 DIAGNOSIS — D638 Anemia in other chronic diseases classified elsewhere: Secondary | ICD-10-CM | POA: Diagnosis not present

## 2021-08-20 DIAGNOSIS — N184 Chronic kidney disease, stage 4 (severe): Secondary | ICD-10-CM | POA: Diagnosis not present

## 2021-08-20 DIAGNOSIS — N189 Chronic kidney disease, unspecified: Secondary | ICD-10-CM | POA: Diagnosis not present

## 2021-08-20 DIAGNOSIS — D696 Thrombocytopenia, unspecified: Secondary | ICD-10-CM | POA: Diagnosis not present

## 2021-08-20 DIAGNOSIS — I5042 Chronic combined systolic (congestive) and diastolic (congestive) heart failure: Secondary | ICD-10-CM | POA: Diagnosis not present

## 2021-08-20 DIAGNOSIS — R809 Proteinuria, unspecified: Secondary | ICD-10-CM | POA: Diagnosis not present

## 2021-08-29 DIAGNOSIS — I5042 Chronic combined systolic (congestive) and diastolic (congestive) heart failure: Secondary | ICD-10-CM | POA: Diagnosis not present

## 2021-08-29 DIAGNOSIS — I1310 Hypertensive heart and chronic kidney disease without heart failure, with stage 1 through stage 4 chronic kidney disease, or unspecified chronic kidney disease: Secondary | ICD-10-CM | POA: Diagnosis not present

## 2021-08-29 DIAGNOSIS — I129 Hypertensive chronic kidney disease with stage 1 through stage 4 chronic kidney disease, or unspecified chronic kidney disease: Secondary | ICD-10-CM | POA: Diagnosis not present

## 2021-08-29 DIAGNOSIS — R809 Proteinuria, unspecified: Secondary | ICD-10-CM | POA: Diagnosis not present

## 2021-08-29 DIAGNOSIS — D696 Thrombocytopenia, unspecified: Secondary | ICD-10-CM | POA: Diagnosis not present

## 2021-08-29 DIAGNOSIS — N184 Chronic kidney disease, stage 4 (severe): Secondary | ICD-10-CM | POA: Diagnosis not present

## 2021-08-29 DIAGNOSIS — N170 Acute kidney failure with tubular necrosis: Secondary | ICD-10-CM | POA: Diagnosis not present

## 2021-08-29 DIAGNOSIS — N189 Chronic kidney disease, unspecified: Secondary | ICD-10-CM | POA: Diagnosis not present

## 2021-08-29 DIAGNOSIS — E8722 Chronic metabolic acidosis: Secondary | ICD-10-CM | POA: Diagnosis not present

## 2021-08-29 DIAGNOSIS — D638 Anemia in other chronic diseases classified elsewhere: Secondary | ICD-10-CM | POA: Diagnosis not present

## 2021-09-03 ENCOUNTER — Other Ambulatory Visit: Payer: Self-pay | Admitting: Cardiovascular Disease

## 2021-10-11 DIAGNOSIS — R443 Hallucinations, unspecified: Secondary | ICD-10-CM | POA: Diagnosis not present

## 2021-10-11 DIAGNOSIS — F259 Schizoaffective disorder, unspecified: Secondary | ICD-10-CM | POA: Diagnosis not present

## 2021-10-11 DIAGNOSIS — F209 Schizophrenia, unspecified: Secondary | ICD-10-CM | POA: Diagnosis not present

## 2021-10-11 DIAGNOSIS — Z87891 Personal history of nicotine dependence: Secondary | ICD-10-CM | POA: Diagnosis not present

## 2021-10-11 DIAGNOSIS — N189 Chronic kidney disease, unspecified: Secondary | ICD-10-CM | POA: Diagnosis not present

## 2021-10-11 DIAGNOSIS — R4689 Other symptoms and signs involving appearance and behavior: Secondary | ICD-10-CM | POA: Diagnosis not present

## 2021-10-11 DIAGNOSIS — Z79899 Other long term (current) drug therapy: Secondary | ICD-10-CM | POA: Diagnosis not present

## 2021-10-11 DIAGNOSIS — R296 Repeated falls: Secondary | ICD-10-CM | POA: Diagnosis not present

## 2021-10-11 DIAGNOSIS — F250 Schizoaffective disorder, bipolar type: Secondary | ICD-10-CM | POA: Diagnosis not present

## 2021-10-11 DIAGNOSIS — R531 Weakness: Secondary | ICD-10-CM | POA: Diagnosis not present

## 2021-10-11 DIAGNOSIS — F22 Delusional disorders: Secondary | ICD-10-CM | POA: Diagnosis not present

## 2021-10-11 DIAGNOSIS — R442 Other hallucinations: Secondary | ICD-10-CM | POA: Diagnosis not present

## 2021-10-11 DIAGNOSIS — I129 Hypertensive chronic kidney disease with stage 1 through stage 4 chronic kidney disease, or unspecified chronic kidney disease: Secondary | ICD-10-CM | POA: Diagnosis not present

## 2021-10-11 DIAGNOSIS — H538 Other visual disturbances: Secondary | ICD-10-CM | POA: Diagnosis not present

## 2021-10-11 DIAGNOSIS — F29 Unspecified psychosis not due to a substance or known physiological condition: Secondary | ICD-10-CM | POA: Diagnosis not present

## 2021-10-11 DIAGNOSIS — F69 Unspecified disorder of adult personality and behavior: Secondary | ICD-10-CM | POA: Diagnosis not present

## 2021-10-11 DIAGNOSIS — Z7901 Long term (current) use of anticoagulants: Secondary | ICD-10-CM | POA: Diagnosis not present

## 2021-10-12 DIAGNOSIS — I10 Essential (primary) hypertension: Secondary | ICD-10-CM | POA: Diagnosis not present

## 2021-10-12 DIAGNOSIS — F250 Schizoaffective disorder, bipolar type: Secondary | ICD-10-CM | POA: Diagnosis not present

## 2021-10-13 DIAGNOSIS — F250 Schizoaffective disorder, bipolar type: Secondary | ICD-10-CM | POA: Diagnosis not present

## 2021-10-13 DIAGNOSIS — I10 Essential (primary) hypertension: Secondary | ICD-10-CM | POA: Diagnosis not present

## 2021-10-14 DIAGNOSIS — F259 Schizoaffective disorder, unspecified: Secondary | ICD-10-CM | POA: Diagnosis not present

## 2021-11-05 DIAGNOSIS — I129 Hypertensive chronic kidney disease with stage 1 through stage 4 chronic kidney disease, or unspecified chronic kidney disease: Secondary | ICD-10-CM | POA: Diagnosis not present

## 2021-11-05 DIAGNOSIS — N189 Chronic kidney disease, unspecified: Secondary | ICD-10-CM | POA: Diagnosis not present

## 2021-11-05 DIAGNOSIS — I1310 Hypertensive heart and chronic kidney disease without heart failure, with stage 1 through stage 4 chronic kidney disease, or unspecified chronic kidney disease: Secondary | ICD-10-CM | POA: Diagnosis not present

## 2021-11-05 DIAGNOSIS — E8722 Chronic metabolic acidosis: Secondary | ICD-10-CM | POA: Diagnosis not present

## 2021-11-05 DIAGNOSIS — R809 Proteinuria, unspecified: Secondary | ICD-10-CM | POA: Diagnosis not present

## 2021-11-05 DIAGNOSIS — N170 Acute kidney failure with tubular necrosis: Secondary | ICD-10-CM | POA: Diagnosis not present

## 2021-11-05 DIAGNOSIS — D638 Anemia in other chronic diseases classified elsewhere: Secondary | ICD-10-CM | POA: Diagnosis not present

## 2021-11-05 DIAGNOSIS — N184 Chronic kidney disease, stage 4 (severe): Secondary | ICD-10-CM | POA: Diagnosis not present

## 2021-11-05 DIAGNOSIS — D696 Thrombocytopenia, unspecified: Secondary | ICD-10-CM | POA: Diagnosis not present

## 2021-11-05 DIAGNOSIS — I5042 Chronic combined systolic (congestive) and diastolic (congestive) heart failure: Secondary | ICD-10-CM | POA: Diagnosis not present

## 2021-11-13 DIAGNOSIS — R5381 Other malaise: Secondary | ICD-10-CM | POA: Diagnosis not present

## 2021-11-13 DIAGNOSIS — R69 Illness, unspecified: Secondary | ICD-10-CM | POA: Diagnosis not present

## 2021-11-13 IMAGING — MR MR ABDOMEN W/O CM
12 of 14 series · 38 of 48 positions shown · non-contrast
Comparison: CT 11/09/2014

CLINICAL DATA: Abdominal pain. Indeterminate adrenal lesion and
pancreatic lesion.

EXAM:
MRI ABDOMEN WITHOUT CONTRAST
TECHNIQUE: Multiplanar multisequence MR imaging was performed without the
administration of intravenous contrast.

[Series 3: cor haste · coronal · 6.0mm · 1.19mm/px · 3 of 34 slices shown]
[im 1/34]
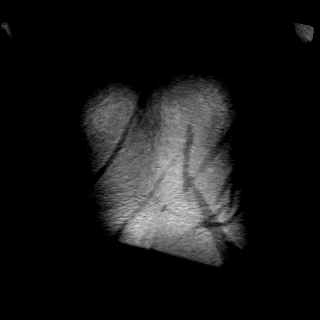
[im 17/34]
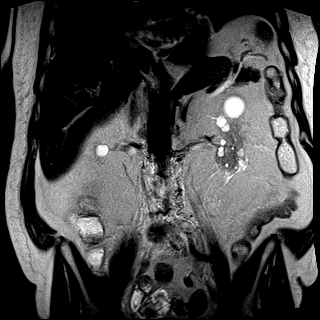
[im 34/34]
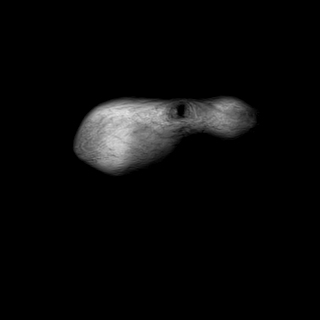

[Series 4: ax haste · axial · 6.0mm · 1.19mm/px · z∈[-100,+152]mm · 3 of 36 slices shown]
[im 1/36]
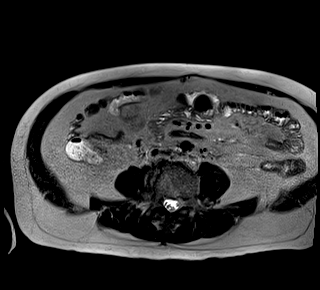
[im 18/36]
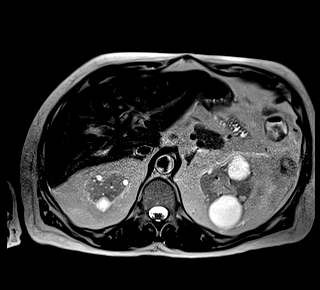
[im 36/36]
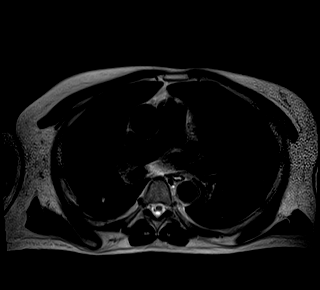

[Series 5: T2 fat-sat · axial · 6.0mm · 1.19mm/px · z∈[-126,+154]mm · 3 of 40 slices shown]
[im 1/40]
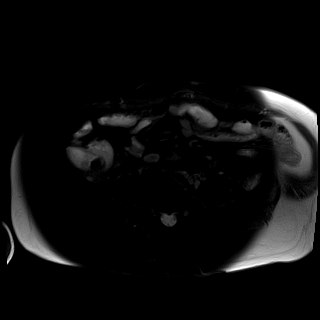
[im 20/40]
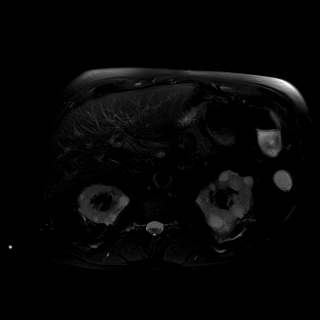
[im 40/40]
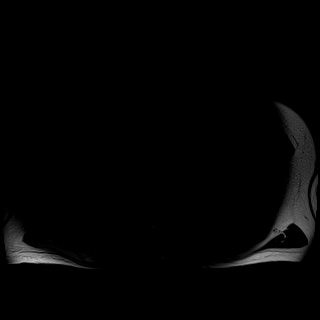

[Series 6: ax in and · axial · 3.5mm · 1.31mm/px · z∈[-120,+129]mm · 5 of 72 slices shown (1 of 2)]
[im 1/72]
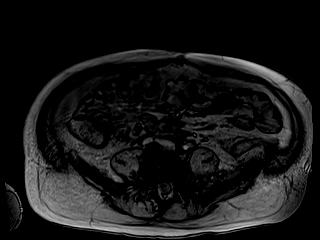
[im 18/72]
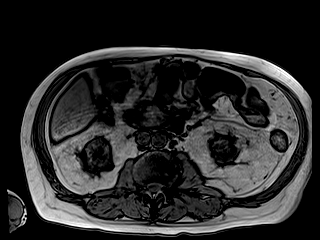
[im 36/72]
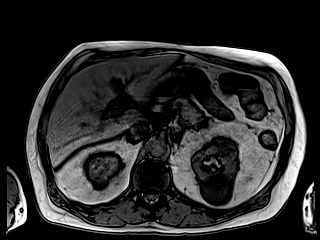
[im 54/72]
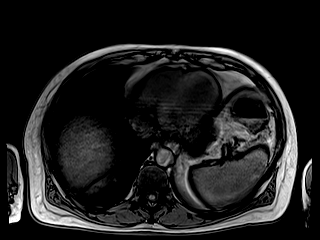
[im 72/72]
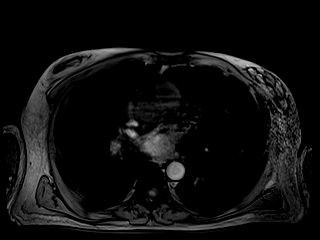

[Series 7: ax in and · axial · 3.5mm · 1.31mm/px · z∈[-120,+129]mm · 4 of 72 slices shown (2 of 2)]
[im 1/72]
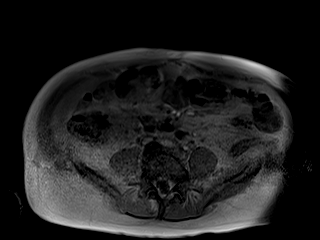
[im 24/72]
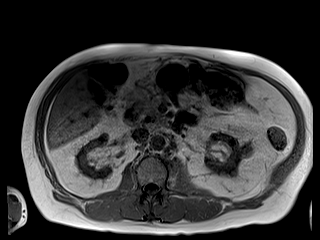
[im 48/72]
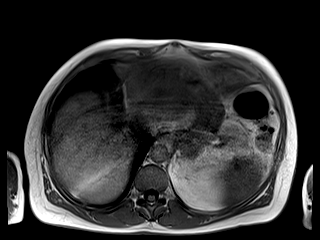
[im 72/72]
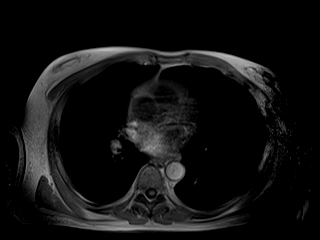

[Series 11: DWI · axial · 6.0mm · 1.42mm/px · z∈[-126,+154]mm · 2 of 40 slices shown (1 of 4)]
[im 1/40]
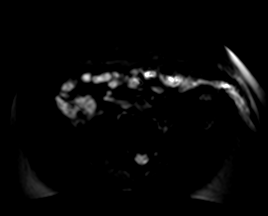
[im 40/40]
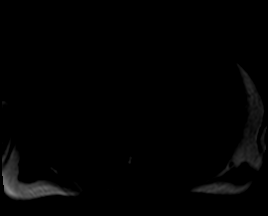

[Series 11: DWI · axial · 6.0mm · 1.42mm/px · z∈[-126,+154]mm · 2 of 40 slices shown (2 of 4)]
[im 1/40]
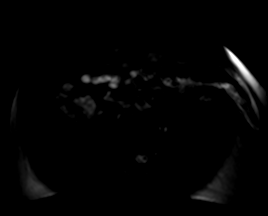
[im 40/40]
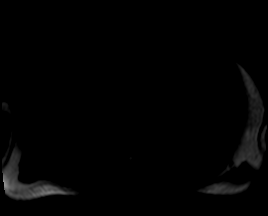

[Series 11: DWI · axial · 6.0mm · 1.42mm/px · z∈[-126,+154]mm · 2 of 40 slices shown (3 of 4)]
[im 1/40]
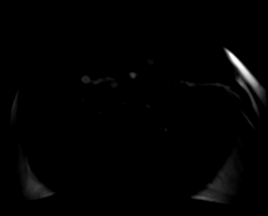
[im 40/40]
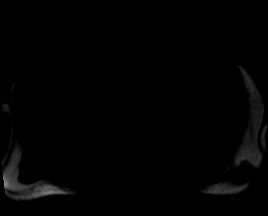

[Series 12: DWI · axial · 6.0mm · 1.42mm/px · z∈[-126,+154]mm · 2 of 40 slices shown (4 of 4)]
[im 1/40]
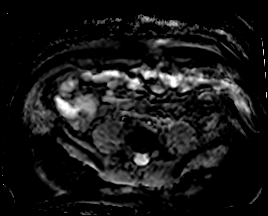
[im 40/40]
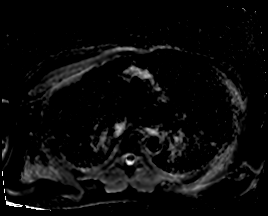

[Series 13: bSSFP · axial · 6.0mm · 0.74mm/px · z∈[-100,+152]mm · 2 of 36 slices shown]
[im 1/36]
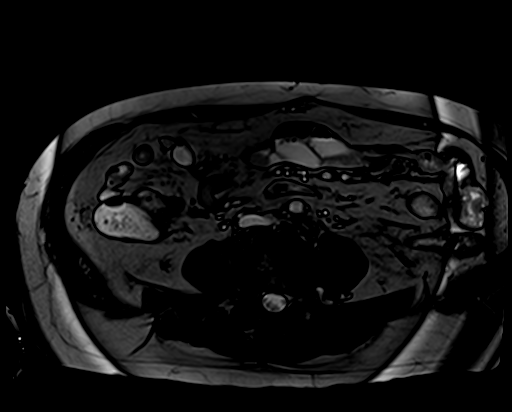
[im 36/36]
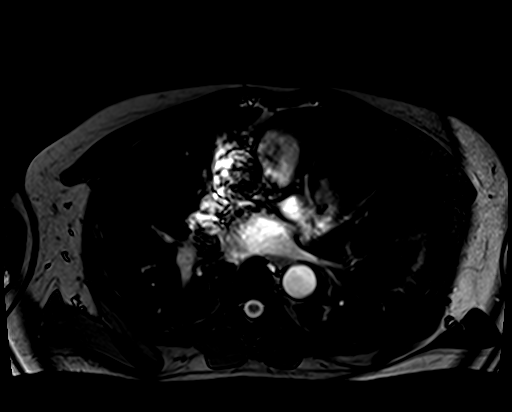

[Series 14: t1_[id number]_bh_pre · axial · 3.0mm · 1.34mm/px · z∈[-114,+123]mm · 5 of 80 slices shown]
[im 1/80]
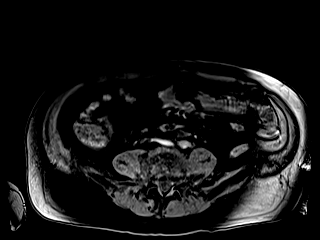
[im 20/80]
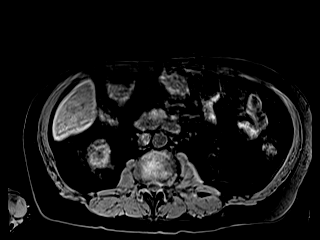
[im 40/80]
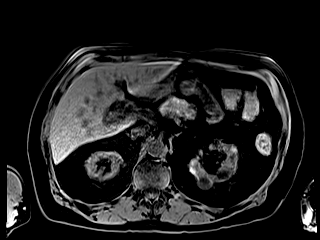
[im 60/80]
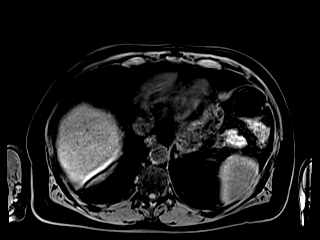
[im 80/80]
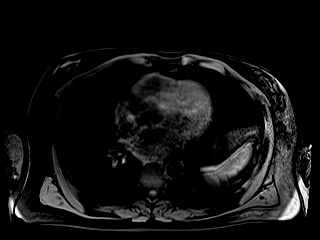

[Series 15: T1 dynamic · coronal · 3.0mm · 1.31mm/px · 5 of 80 slices shown]
[im 1/80]
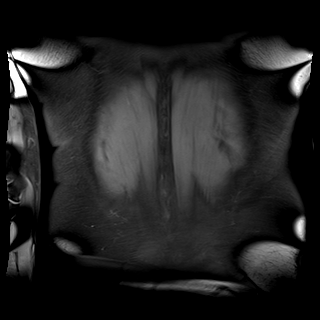
[im 20/80]
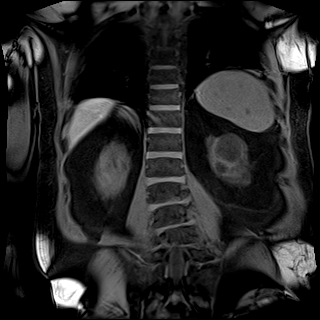
[im 40/80]
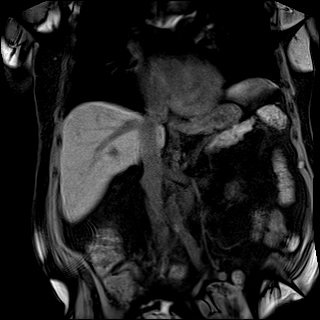
[im 60/80]
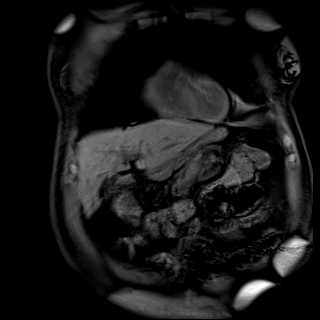
[im 80/80]
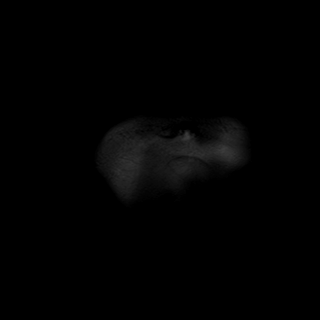

[38 of 48 positions shown; findings below may reference images not displayed]

FINDINGS: Lower chest: Lung bases are clear.

Hepatobiliary: No focal hepatic lesion. No hepatic steatosis
identified. Gallbladder normal. Common bile duct normal.

Pancreas: Pancreas is normal. No ductal dilatation. No pancreatic
inflammation. No variant pancreatic ductal anatomy.

Spleen: Normal spleen

Adrenals/urinary tract: Adrenal glands are normal.

There are multiple bilateral renal cysts which are high signal
intensity on T2 weighted imaging and low signal intensity on T1
weighted imaging suggesting simple cysts. No IV contrast
administered.

Stomach/Bowel: Stomach limited view of the bowel is unremarkable.

Vascular/Lymphatic: Abdominal aorta is normal caliber.  Adenopathy.

Other: No free fluid.

Musculoskeletal: No aggressive osseous lesion.
IMPRESSION: 1. No acute abdominal findings.
2. Normal liver, gallbladder, and biliary tree.
3. Normal pancreas.
4. Normal adrenal glands.
5. Bilateral benign-appearing renal cysts.  No IV contrast

## 2021-11-14 DIAGNOSIS — R809 Proteinuria, unspecified: Secondary | ICD-10-CM | POA: Diagnosis not present

## 2021-11-14 DIAGNOSIS — D638 Anemia in other chronic diseases classified elsewhere: Secondary | ICD-10-CM | POA: Diagnosis not present

## 2021-11-14 DIAGNOSIS — I9589 Other hypotension: Secondary | ICD-10-CM | POA: Diagnosis not present

## 2021-11-14 DIAGNOSIS — N170 Acute kidney failure with tubular necrosis: Secondary | ICD-10-CM | POA: Diagnosis not present

## 2021-11-14 DIAGNOSIS — I5042 Chronic combined systolic (congestive) and diastolic (congestive) heart failure: Secondary | ICD-10-CM | POA: Diagnosis not present

## 2021-11-14 DIAGNOSIS — D696 Thrombocytopenia, unspecified: Secondary | ICD-10-CM | POA: Diagnosis not present

## 2021-11-14 DIAGNOSIS — N184 Chronic kidney disease, stage 4 (severe): Secondary | ICD-10-CM | POA: Diagnosis not present

## 2021-11-14 DIAGNOSIS — Z8679 Personal history of other diseases of the circulatory system: Secondary | ICD-10-CM | POA: Diagnosis not present

## 2021-11-18 DIAGNOSIS — I10 Essential (primary) hypertension: Secondary | ICD-10-CM | POA: Diagnosis not present

## 2021-11-18 DIAGNOSIS — Z299 Encounter for prophylactic measures, unspecified: Secondary | ICD-10-CM | POA: Diagnosis not present

## 2021-11-18 DIAGNOSIS — S20469A Insect bite (nonvenomous) of unspecified back wall of thorax, initial encounter: Secondary | ICD-10-CM | POA: Diagnosis not present

## 2021-12-04 IMAGING — US US LIVER ELASTOGRAPHY
1 series · 12 of 25 positions shown · non-contrast
Comparison: None.

CLINICAL DATA: Increased ammonia levels.  Thrombocytopenia

EXAM:
US LIVER ELASTOGRAPHY
TECHNIQUE: Sonography of the liver was performed. In addition, ultrasound
elastography evaluation of the liver was performed. A region of
interest was placed within the right lobe of the liver. Following
application of a compressive sonographic pulse, tissue
compressibility was assessed. Multiple assessments were performed at
the selected site. Median tissue compressibility was determined.
Previously, hepatic stiffness was assessed by shear wave velocity.
Based on recently published Society of Radiologists in Ultrasound
consensus article, reporting is now recommended to be performed in
the SI units of pressure (kiloPascals) representing hepatic
stiffness/elasticity. The obtained result is compared to the
published reference standards. (cACLD = compensated Advanced Chronic
Liver Disease)

[Series 1: us elastography liver · 12 of 32 slices shown]
[im 2/32]
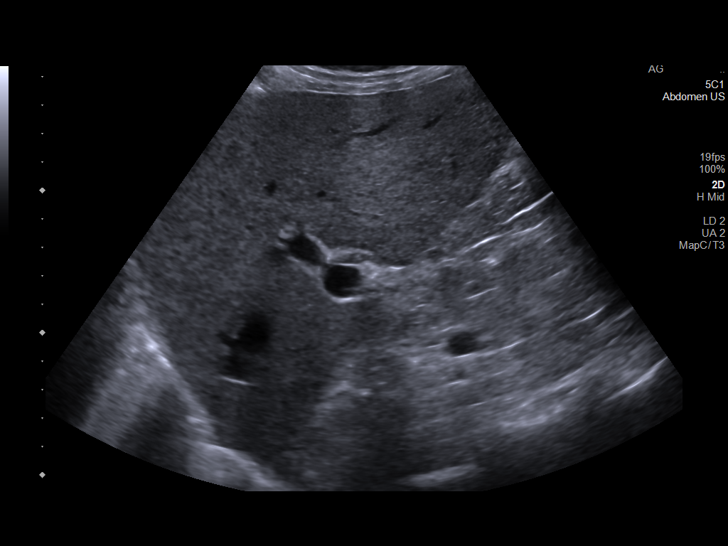
[im 4/32]
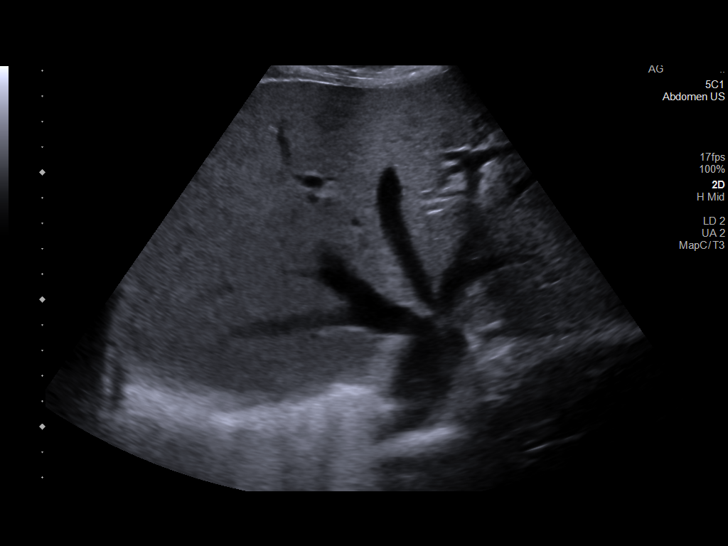
[im 7/32]
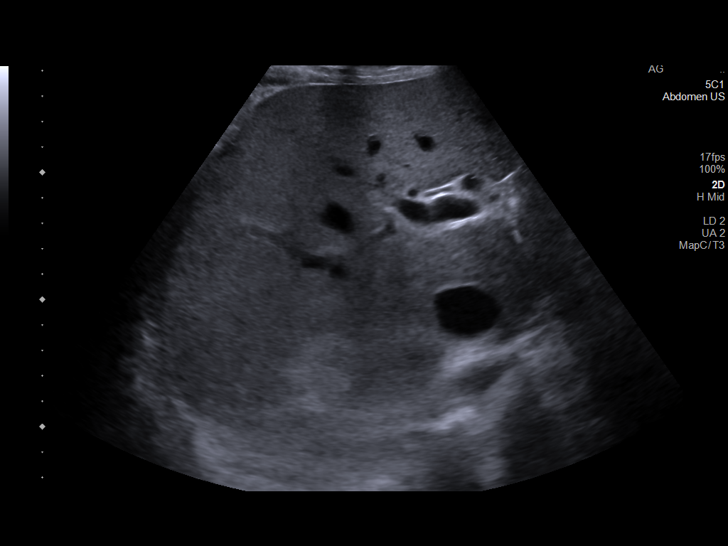
[im 10/32]
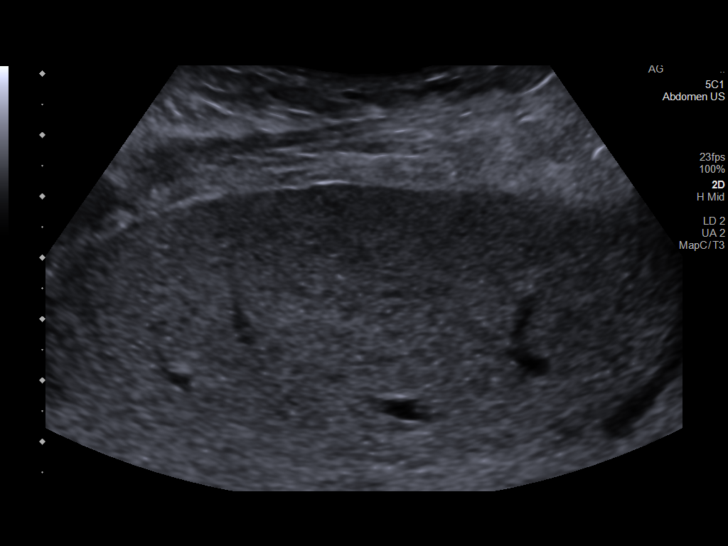
[im 12/32]
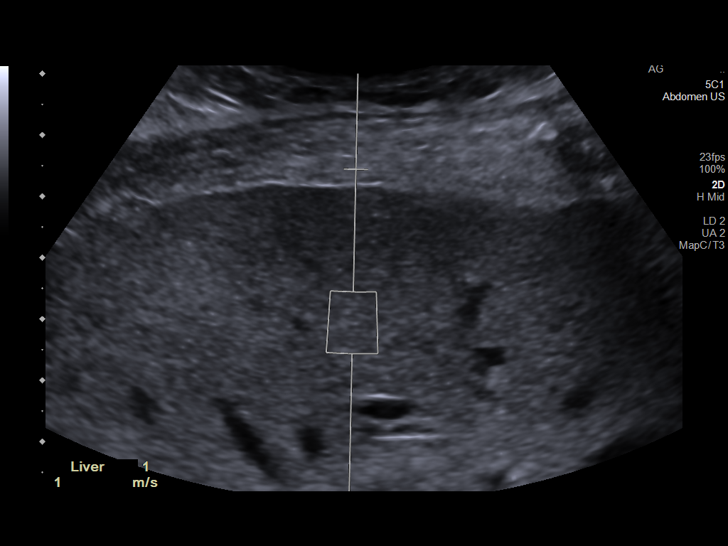
[im 15/32]
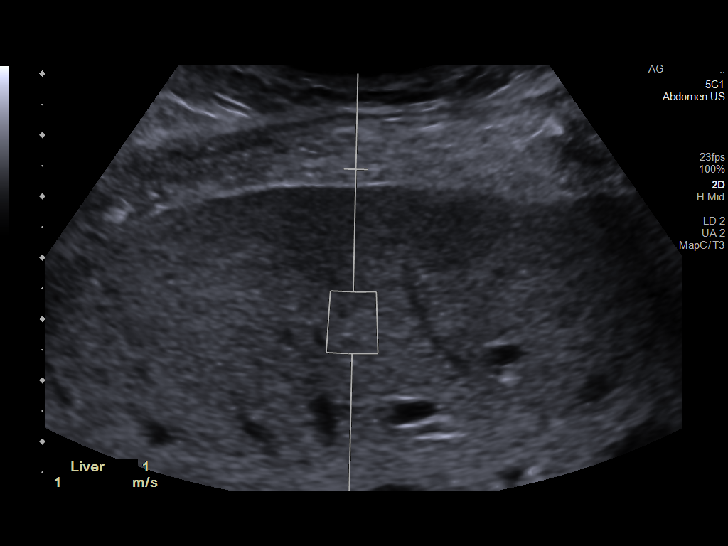
[im 17/32]
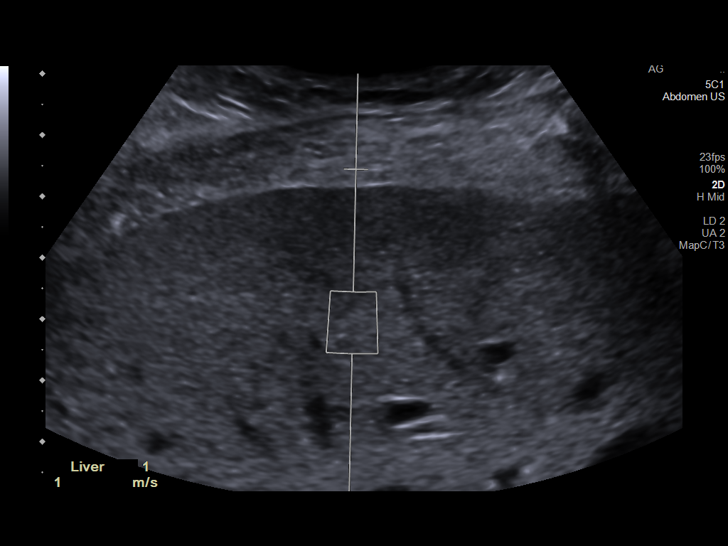
[im 20/32]
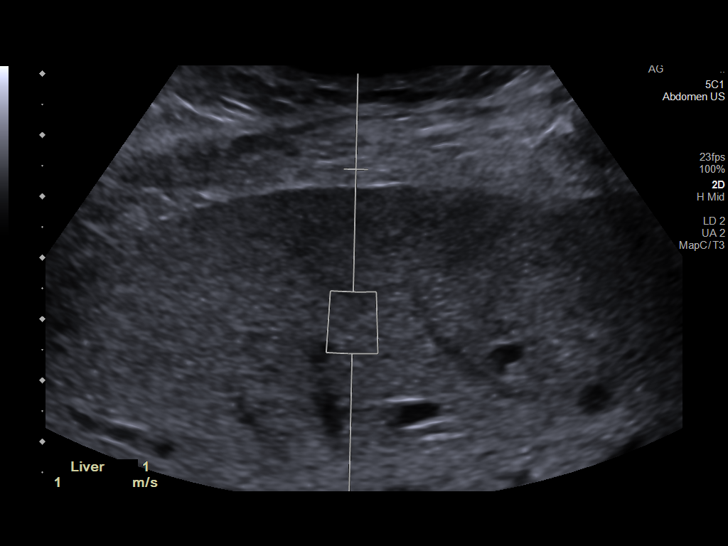
[im 22/32]
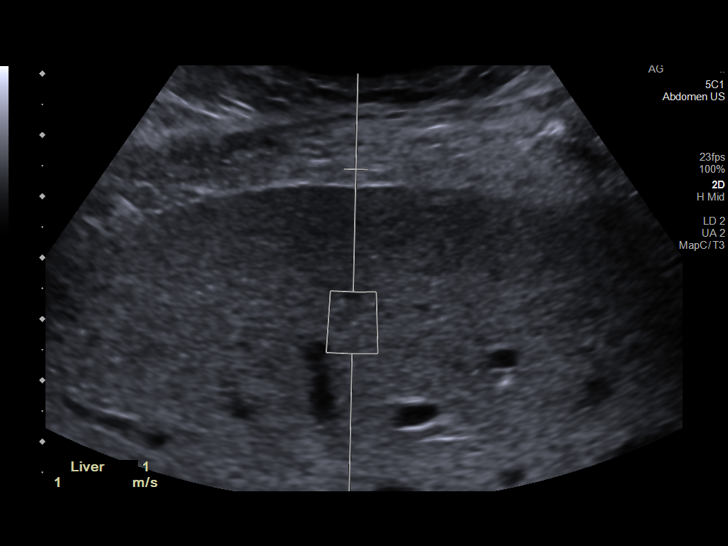
[im 25/32]
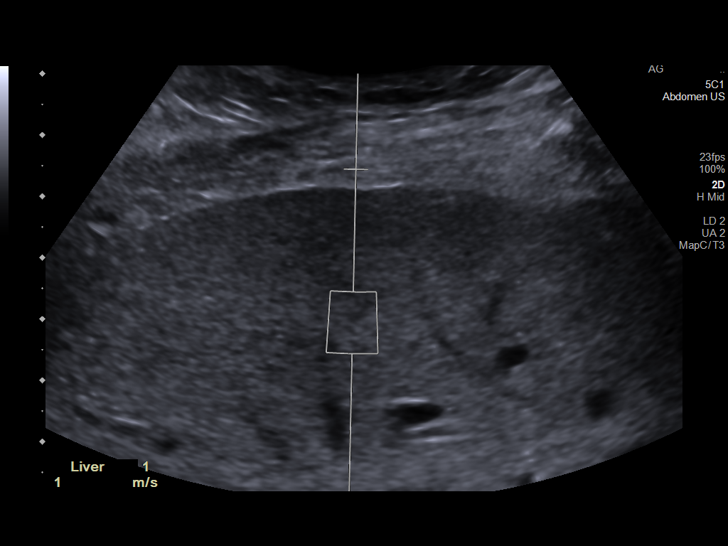
[im 28/32]
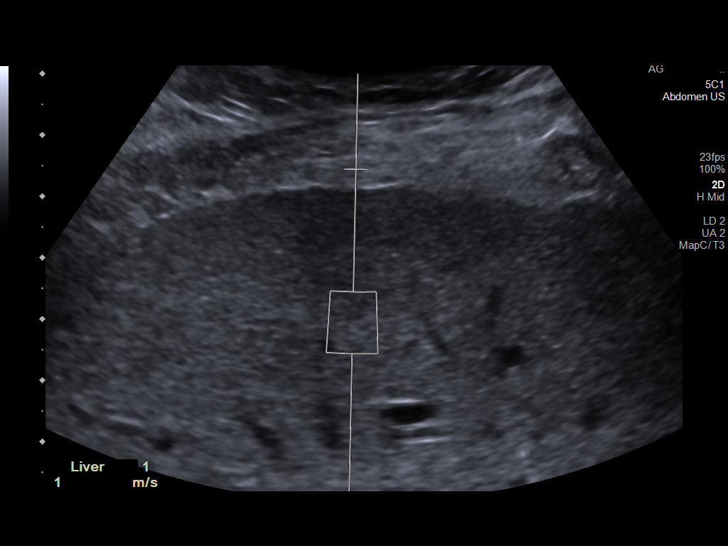
[im 30/32]
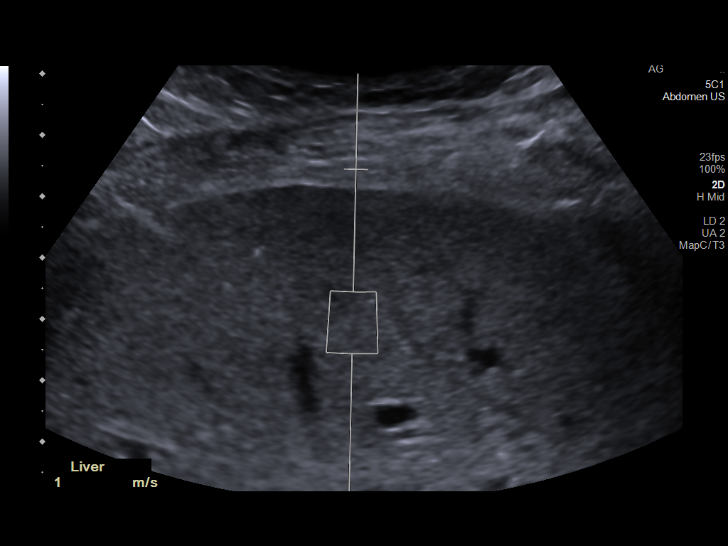

[12 of 25 positions shown; findings below may reference images not displayed]

FINDINGS: Liver: Mildly increased echotexture suggesting fatty infiltration.
Portal vein is patent on color Doppler imaging with normal direction
of blood flow towards the liver.

ULTRASOUND HEPATIC ELASTOGRAPHY

Device: Siemens Helix VTQ

Patient position: Oblique

Transducer: [id number]

Number of measurements: 14

Hepatic segment:  8

Median kPa:

IQR:

IQR/Median kPa ratio:

Data quality:  Good

Diagnostic category:  < or = 5 kPa: high probability of being normal

The use of hepatic elastography is applicable to patients with viral
hepatitis and non-alcoholic fatty liver disease. At this time, there
is insufficient data for the referenced cut-off values and use in
other causes of liver disease, including alcoholic liver disease.
Patients, however, may be assessed by elastography and serve as
their own reference standard/baseline.

In patients with non-alcoholic liver disease, the values suggesting
compensated advanced chronic liver disease (cACLD) may be lower, and
patients may need additional testing with elasticity results of [DATE]
kPa.

Please note that abnormal hepatic elasticity and shear wave
velocities may also be identified in clinical settings other than
with hepatic fibrosis, such as: acute hepatitis, elevated right
heart and central venous pressures including use of beta blockers,
Enoc disease ([place]), infiltrative processes such as
mastocytosis/amyloidosis/infiltrative tumor/lymphoma, extrahepatic
cholestasis, with hyperemia in the post-prandial state, and with
liver transplantation. Correlation with patient history, laboratory
data, and clinical condition recommended.

Diagnostic Categories:

< or =5 kPa: high probability of being normal

< or =9 kPa: in the absence of other known clinical signs, rules [DATE] kPa and ?13 kPa: suggestive of cACLD, but needs further testing

>13 kPa: highly suggestive of cACLD

> or =17 kPa: highly suggestive of cACLD with an increased
probability of clinically significant portal hypertension
IMPRESSION: ULTRASOUND LIVER:

Hepatic steatosis

ULTRASOUND HEPATIC ELASTOGRAPHY:

Median kPa:

Diagnostic category:  < or = 5 kPa: high probability of being normal

## 2021-12-05 ENCOUNTER — Other Ambulatory Visit: Payer: Self-pay | Admitting: Cardiovascular Disease

## 2021-12-10 ENCOUNTER — Ambulatory Visit: Payer: Medicare Other | Admitting: Cardiovascular Disease

## 2021-12-19 DIAGNOSIS — N184 Chronic kidney disease, stage 4 (severe): Secondary | ICD-10-CM | POA: Diagnosis not present

## 2021-12-19 DIAGNOSIS — M79671 Pain in right foot: Secondary | ICD-10-CM | POA: Diagnosis not present

## 2021-12-19 DIAGNOSIS — I5022 Chronic systolic (congestive) heart failure: Secondary | ICD-10-CM | POA: Diagnosis not present

## 2021-12-19 DIAGNOSIS — Z299 Encounter for prophylactic measures, unspecified: Secondary | ICD-10-CM | POA: Diagnosis not present

## 2021-12-19 DIAGNOSIS — K551 Chronic vascular disorders of intestine: Secondary | ICD-10-CM | POA: Diagnosis not present

## 2021-12-30 DIAGNOSIS — Z8679 Personal history of other diseases of the circulatory system: Secondary | ICD-10-CM | POA: Diagnosis not present

## 2021-12-30 DIAGNOSIS — D638 Anemia in other chronic diseases classified elsewhere: Secondary | ICD-10-CM | POA: Diagnosis not present

## 2021-12-30 DIAGNOSIS — I5042 Chronic combined systolic (congestive) and diastolic (congestive) heart failure: Secondary | ICD-10-CM | POA: Diagnosis not present

## 2021-12-30 DIAGNOSIS — I9589 Other hypotension: Secondary | ICD-10-CM | POA: Diagnosis not present

## 2021-12-30 DIAGNOSIS — D696 Thrombocytopenia, unspecified: Secondary | ICD-10-CM | POA: Diagnosis not present

## 2021-12-30 DIAGNOSIS — R809 Proteinuria, unspecified: Secondary | ICD-10-CM | POA: Diagnosis not present

## 2021-12-30 DIAGNOSIS — N184 Chronic kidney disease, stage 4 (severe): Secondary | ICD-10-CM | POA: Diagnosis not present

## 2021-12-30 DIAGNOSIS — N170 Acute kidney failure with tubular necrosis: Secondary | ICD-10-CM | POA: Diagnosis not present

## 2022-01-14 DIAGNOSIS — R234 Changes in skin texture: Secondary | ICD-10-CM | POA: Diagnosis not present

## 2022-01-14 DIAGNOSIS — F200 Paranoid schizophrenia: Secondary | ICD-10-CM | POA: Diagnosis not present

## 2022-01-14 DIAGNOSIS — N184 Chronic kidney disease, stage 4 (severe): Secondary | ICD-10-CM | POA: Diagnosis not present

## 2022-01-14 DIAGNOSIS — Z299 Encounter for prophylactic measures, unspecified: Secondary | ICD-10-CM | POA: Diagnosis not present

## 2022-01-14 DIAGNOSIS — I10 Essential (primary) hypertension: Secondary | ICD-10-CM | POA: Diagnosis not present

## 2022-01-18 DIAGNOSIS — N189 Chronic kidney disease, unspecified: Secondary | ICD-10-CM | POA: Diagnosis not present

## 2022-01-18 DIAGNOSIS — D696 Thrombocytopenia, unspecified: Secondary | ICD-10-CM | POA: Diagnosis not present

## 2022-01-18 DIAGNOSIS — R809 Proteinuria, unspecified: Secondary | ICD-10-CM | POA: Diagnosis not present

## 2022-01-18 DIAGNOSIS — I5042 Chronic combined systolic (congestive) and diastolic (congestive) heart failure: Secondary | ICD-10-CM | POA: Diagnosis not present

## 2022-01-18 DIAGNOSIS — D638 Anemia in other chronic diseases classified elsewhere: Secondary | ICD-10-CM | POA: Diagnosis not present

## 2022-01-18 DIAGNOSIS — I1310 Hypertensive heart and chronic kidney disease without heart failure, with stage 1 through stage 4 chronic kidney disease, or unspecified chronic kidney disease: Secondary | ICD-10-CM | POA: Diagnosis not present

## 2022-01-18 DIAGNOSIS — N170 Acute kidney failure with tubular necrosis: Secondary | ICD-10-CM | POA: Diagnosis not present

## 2022-01-18 DIAGNOSIS — E8722 Chronic metabolic acidosis: Secondary | ICD-10-CM | POA: Diagnosis not present

## 2022-01-18 DIAGNOSIS — I129 Hypertensive chronic kidney disease with stage 1 through stage 4 chronic kidney disease, or unspecified chronic kidney disease: Secondary | ICD-10-CM | POA: Diagnosis not present

## 2022-01-18 DIAGNOSIS — N184 Chronic kidney disease, stage 4 (severe): Secondary | ICD-10-CM | POA: Diagnosis not present

## 2022-01-31 DIAGNOSIS — M109 Gout, unspecified: Secondary | ICD-10-CM | POA: Diagnosis not present

## 2022-01-31 DIAGNOSIS — I10 Essential (primary) hypertension: Secondary | ICD-10-CM | POA: Diagnosis not present

## 2022-01-31 DIAGNOSIS — Z299 Encounter for prophylactic measures, unspecified: Secondary | ICD-10-CM | POA: Diagnosis not present

## 2022-02-07 DIAGNOSIS — Z79899 Other long term (current) drug therapy: Secondary | ICD-10-CM | POA: Diagnosis not present

## 2022-02-07 DIAGNOSIS — R5383 Other fatigue: Secondary | ICD-10-CM | POA: Diagnosis not present

## 2022-02-07 DIAGNOSIS — F200 Paranoid schizophrenia: Secondary | ICD-10-CM | POA: Diagnosis not present

## 2022-02-07 DIAGNOSIS — Z299 Encounter for prophylactic measures, unspecified: Secondary | ICD-10-CM | POA: Diagnosis not present

## 2022-02-07 DIAGNOSIS — E7800 Pure hypercholesterolemia, unspecified: Secondary | ICD-10-CM | POA: Diagnosis not present

## 2022-02-07 DIAGNOSIS — Z7189 Other specified counseling: Secondary | ICD-10-CM | POA: Diagnosis not present

## 2022-02-07 DIAGNOSIS — Z0000 Encounter for general adult medical examination without abnormal findings: Secondary | ICD-10-CM | POA: Diagnosis not present

## 2022-02-07 DIAGNOSIS — I10 Essential (primary) hypertension: Secondary | ICD-10-CM | POA: Diagnosis not present

## 2022-02-07 DIAGNOSIS — Z6826 Body mass index (BMI) 26.0-26.9, adult: Secondary | ICD-10-CM | POA: Diagnosis not present

## 2022-02-07 DIAGNOSIS — Z125 Encounter for screening for malignant neoplasm of prostate: Secondary | ICD-10-CM | POA: Diagnosis not present

## 2022-02-07 DIAGNOSIS — Z1331 Encounter for screening for depression: Secondary | ICD-10-CM | POA: Diagnosis not present

## 2022-02-07 DIAGNOSIS — Z1339 Encounter for screening examination for other mental health and behavioral disorders: Secondary | ICD-10-CM | POA: Diagnosis not present

## 2022-03-13 ENCOUNTER — Ambulatory Visit: Payer: Medicare Other | Attending: Cardiovascular Disease | Admitting: Cardiovascular Disease

## 2022-03-13 NOTE — Progress Notes (Deleted)
Cardiology Office Note   Date:  03/13/2022   ID:  Glenn Hawkins, DOB [birth year], MRN [id number]  PCP:  Glenda Chroman, MD  Cardiologist:   Kathlyn Sacramento, MD   No chief complaint on file.      History of Present Illness: Glenn Hawkins is a 67 y.o. male who presents for a followup visit. He has known history of coronary artery disease status post myocardial infarction with late presentation. He is being treated medically and did not have cardiac catheterization done due to chronic kidney disease (creatinine around 2.5) and late presentation. He had a nuclear stress test in January of 2013 which showed mostly a large transmural infarct in the left circumflex distribution without significant ischemia. EF was 40-45% with moderate mitral regurgitation.  He had extensive right leg DVT in August 2016 and has been on anticoagulation since then. He also has  bilateral calf claudication due to suspected inflow disease.  Most recent echocardiogram in July 2021 showed an EF of 30 to 35% with akinesis of the inferolateral wall, mild mitral regurgitation and mild to moderate aortic regurgitation. He complained of some worsening of bilateral leg pain. Noninvasive vascular studies were repeated and showed stable ABI in the 0.85 range. Duplex showed no significant infrainguinal disease.  He has been followed by GI due to abdominal pain.  He underwent MRI of the abdomen which showed no acute findings. He underwent CT abdomen at the [facility] which showed incidental finding of significant distal abdominal aortic narrowing extending into bilateral common iliac arteries.  The IMA was noted to be hypertrophied providing collateral.  The patient has progressive chronic kidney disease with most recent creatinine of 4.47.  Estimated GFR 14. He reports improved appetite overall and he gained some of the weight back.  He reports stable bilateral calf claudication which does not seem to be lifestyle limiting at this point.   No chest pain or shortness of breath.  Past Medical History:  Diagnosis Date   Aortic insufficiency    a. 08/2019 Echo: mild to mod AI.   C. difficile diarrhea 07/2020   CKD (chronic kidney disease) stage 3, GFR 30-59 ml/min (HCC)    baseline Cr around 2.5   Coronary atherosclerosis of native coronary artery    a. 02/2011 Late presentation MI-->Myoview w/ large area of transmural infarct in LCX distribution, no ischemia.   Depressive disorder, not elsewhere classified    Esophageal reflux    Gout, unspecified    Hemorrhoids    a. 01/2016 s/p hemorrhoidectomy.   HFrEF (heart failure with reduced ejection fraction) ([place])    a. 04/2012 Echo: EF 40%, inflat AK, Gr1 DD, mild AI/MR, triv TR; b. 11/2017 EchoP EF 30-35%, inflat HK; c. 08/2019 Echo: EF 30-35%, gr1 DD, inflat AK.   History of DVT (deep vein thrombosis)    a. Chronic Eliquis.   Hyperlipidemia LDL goal <70    Hypertension    Hypotension, unspecified    Ischemic cardiomyopathy    a. 04/2012 Echo: EF 40%, inflat AK, Gr1 DD, mild AI/MR, triv TR; b. 11/2017 EchoP EF 30-35%, inflat HK; c. 08/2019 Echo: EF 30-35%, gr1 DD, inflat AK. Nl RV size/fxn. Mild MR. Mild to mod AI.   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 04/2012 Echo: EF 40%, mild MR.   Myocardial infarction (lateral wall) ([place]) 2013   a. 02/2011 - late presentation. Managed conservatively 2/2 CKD;  b. 02/2011 Myoview: Large transmural infarct in the LCX distribution, no ischemia.  PAD (peripheral artery disease) ([place])    a. 08/2015 ABI/duplex: R: 0.86, L 0.96. Duplex w/ bilateral heterogeneous plaque in mid fem arteries, no significant stenoses; b. 08/2019 ABI/Duplex: prob bilat inflow dzs w/ stable ABIs (R 0.85, L 0.93).   Personal history of tobacco use, presenting hazards to health    Torn ligament    Unspecified schizophrenia, unspecified condition     Past Surgical History:  Procedure Laterality Date   BIOPSY  12/17/2020   Procedure: BIOPSY;  Surgeon: Eloise Harman, DO;   [facility]: [facility];  Service: Endoscopy;;   COLONOSCOPY     COLONOSCOPY N/A 11/10/2014   Procedure: COLONOSCOPY;  Surgeon: Manus Gunning, MD;  Location: [place];  Service: Gastroenterology;  Laterality: N/A;   COLONOSCOPY WITH PROPOFOL N/A 05/08/2015   Surgeon: [name], MD; nonthrombosed external hemorrhoids, one 8 mm tubular adenoma, one 4 mm tubular adenoma.  Repeat colonoscopy in 5-10 years.   COLONOSCOPY WITH PROPOFOL N/A 12/17/2020   Procedure: COLONOSCOPY WITH PROPOFOL;  Surgeon: Eloise Harman, DO;  Location: [facility];  Service: Endoscopy;  Laterality: N/A;  8:30am   ESOPHAGOGASTRODUODENOSCOPY (EGD) WITH PROPOFOL N/A 05/08/2015   Surgeon: [name], MD; LA grade a reflux esophagitis, normal stomach and duodenum.   ESOPHAGOGASTRODUODENOSCOPY (EGD) WITH PROPOFOL N/A 12/17/2020   Procedure: ESOPHAGOGASTRODUODENOSCOPY (EGD) WITH PROPOFOL;  Surgeon: Eloise Harman, DO;  [facility]: [facility];  Service: Endoscopy;  Laterality: N/A;   EYE SURGERY Right    removal of foreign body   HEMORRHOID SURGERY N/A 02/20/2016   Procedure: EXTENSIVE HEMORRHOIDECTOMY;  Surgeon: [name], MD;  Location: [facility];  Service: General;  Laterality: N/A;   None     POLYPECTOMY  05/08/2015   Procedure: POLYPECTOMY;  Surgeon: [name], MD;  Location: [facility];  Service: Endoscopy;;  transverse colon polyp     Current Outpatient Medications  Medication Sig Dispense Refill   amLODipine (NORVASC) 10 MG tablet Take 10 mg by mouth daily.     apixaban (ELIQUIS) 2.5 MG TABS tablet Take 2.5 mg by mouth 2 (two) times daily.     ARIPiprazole (ABILIFY) 20 MG tablet Take 20 mg by mouth daily.     buPROPion ER (WELLBUTRIN SR) 100 MG 12 hr tablet Take 100 mg by mouth 2 (two) times daily.     Cholecalciferol 50 MCG (2000 UT) TABS Take 2,000 Units by mouth daily.     COLCRYS 0.6 MG tablet Take 0.6 mg by mouth 2 (two) times daily.     esomeprazole (NEXIUM) 20 MG  capsule Take 1 capsule (20 mg total) by mouth daily before breakfast. (Patient taking differently: Take 20 mg by mouth daily as needed (acid reflux).) 30 capsule 3   isosorbide mononitrate (IMDUR) 60 MG 24 hr tablet Take 1 tablet (60 mg total) by mouth every morning. Patient needs appointment for further refills. 1 st attempt 30 tablet 0   lactulose (CHRONULAC) 10 GM/15ML solution Take 15 mLs (10 g total) by mouth 2 (two) times daily. (Patient not taking: No sig reported) 946 mL 1   latanoprost (XALATAN) 0.005 % ophthalmic solution Place 1 drop into both eyes at bedtime.     LORazepam (ATIVAN) 2 MG tablet Take 2 mg by mouth at bedtime. 78m at night     metoprolol tartrate (LOPRESSOR) 100 MG tablet Take 100 mg by mouth 2 (two) times daily.     nitroGLYCERIN (NITROSTAT) 0.4 MG SL tablet Place 1 tablet (0.4 mg  total) under the tongue every 5 (five) minutes as needed for chest pain. (Patient not taking: Reported on 12/11/2020) 25 tablet 3   QUEtiapine (SEROQUEL) 300 MG tablet Take 450 mg by mouth at bedtime.     rosuvastatin (CRESTOR) 40 MG tablet Take 40 mg by mouth daily.     sertraline (ZOLOFT) 100 MG tablet Take 200 mg by mouth daily.     sodium bicarbonate 650 MG tablet Take 650 mg by mouth 2 (two) times daily.     No current facility-administered medications for this visit.    Allergies:   Nifedipine, Rabeprazole, Benazepril, Haloperidol, and Omeprazole    Social History:  The patient  reports that he quit smoking about 26 years ago. His smoking use included cigarettes. He has a 23.00 pack-year smoking history. He has never used smokeless tobacco. He reports that he does not currently use drugs after having used the following drugs: Cocaine. He reports that he does not drink alcohol.   Family History:  The patient's family history includes Crohn's disease in his sister.    ROS:  Please see the history of present illness.   Otherwise, review of systems are positive for none.   All other  systems are reviewed and negative.    PHYSICAL EXAM: VS:  There were no vitals taken for this visit. , BMI There is no height or weight on file to calculate BMI. GEN: Well nourished, well developed, in no acute distress  HEENT: normal  Neck: no JVD, carotid bruits, or masses Cardiac: RRR; no murmurs, rubs, or gallops,no edema  Respiratory:  clear to auscultation bilaterally, normal work of breathing GI: soft, nontender, nondistended, + BS MS: no deformity or atrophy  Skin: warm and dry, no rash Neuro:  Strength and sensation are intact Psych: euthymic mood, full affect Vascular: Femoral pulses barely palpable on the right and +1 on the left.  Distal pulses are diminished.   EKG:  EKG is ordered today. The ekg ordered today demonstrates normal sinus rhythm with nonspecific ST changes.   Recent Labs: No results found for requested labs within last 365 days.    Lipid Panel    Component Value Date/Time   CHOL 191 08/02/2019 1515   TRIG 175 (H) 08/02/2019 1515   HDL 37 (L) 08/02/2019 1515   CHOLHDL 5.2 (H) 08/02/2019 1515   CHOLHDL 4.2 11/09/2014 0525   VLDL 23 11/09/2014 0525   LDLCALC 123 (H) 08/02/2019 1515   LDLDIRECT 125 (H) 08/02/2019 1515      Wt Readings from Last 3 Encounters:  12/13/20 185 lb (83.9 kg)  11/29/20 184 lb 8 oz (83.7 kg)  11/08/20 166 lb 12.8 oz (75.7 kg)       ASSESSMENT AND PLAN:  1.  Coronary artery disease involving native coronary arteries without angina: He continues to be clinically stable with no new symptoms.  Continue medical therapy.  2. Chronic systolic heart failure: Most recent ejection fraction was 30 to 35%.  His symptoms are stable.  Continue Toprol.   He is not on ACE inhibitor or ARB due to advanced chronic kidney disease.   Most recent GFR was 15.  Spironolactone was discontinued for the same reason.  We could consider switching amlodipine to hydralazine to be used with Imdur.    3. Essential hypertension: Blood pressure is  reasonably controlled on current medications.  4. History of right leg DVT: Currently on anticoagulation with Eliquis.  5. Hyperlipidemia: Recent labs showed an LDL of 123. I agree  with switching simvastatin to rosuvastatin.  6. Peripheral arterial disease: The patient likely has severe aortoiliac disease but he reports stable mild bilateral calf claudication which is currently not lifestyle limiting.  I discussed with him the option of endovascular intervention however, there is significant risk of contrast-induced nephropathy given GFR of 15.  Thus, given stability of symptoms, I recommend continuing medical therapy for now.  7.  Erectile dysfunction: The patient has if he can use a phosphodiesterase inhibitor like sildenafil.  I explained to him that we will have to stop Imdur in order to be able to do that.  In addition, I do not think he will benefit much from these medications given that his erectile dysfunction is likely due to significant peripheral arterial disease.  We elected to keep everything the same for now.     Disposition:   FU with me in 6 months  Signed,  Kathlyn Sacramento, MD  03/13/2022 7:55 AM    [place]

## 2022-03-14 ENCOUNTER — Encounter: Payer: Self-pay | Admitting: Cardiovascular Disease

## 2022-03-25 IMAGING — US US RENAL
1 series · 14 of 25 positions shown · non-contrast
Comparison: 01/30/2020

CLINICAL DATA: Acute on chronic renal disease, initial encounter

EXAM:
RENAL / URINARY TRACT ULTRASOUND COMPLETE

[Series 1: us renal · 14 of 81 slices shown]
[im 1/81]
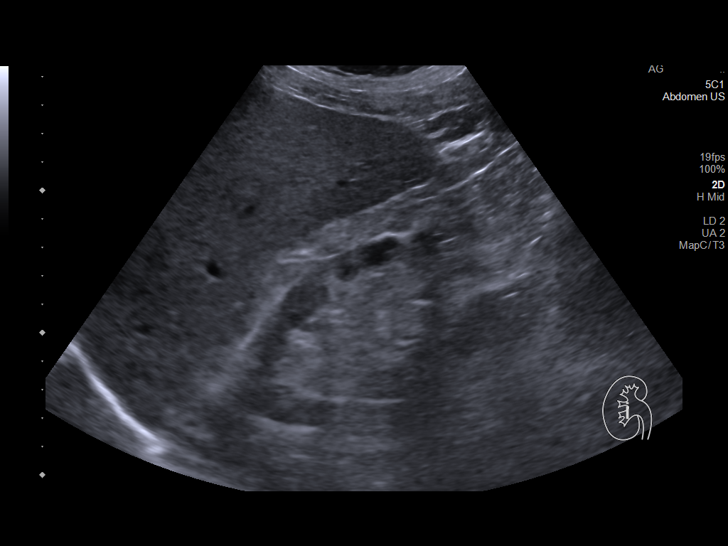
[im 7/81]
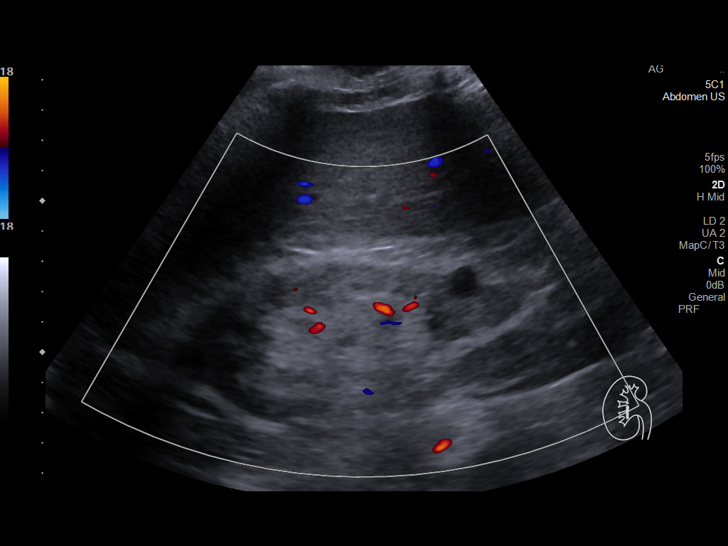
[im 14/81]
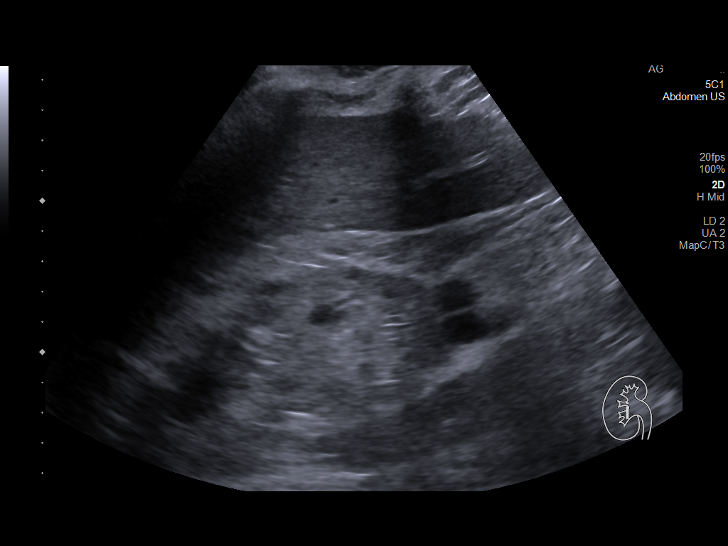
[im 21/81]
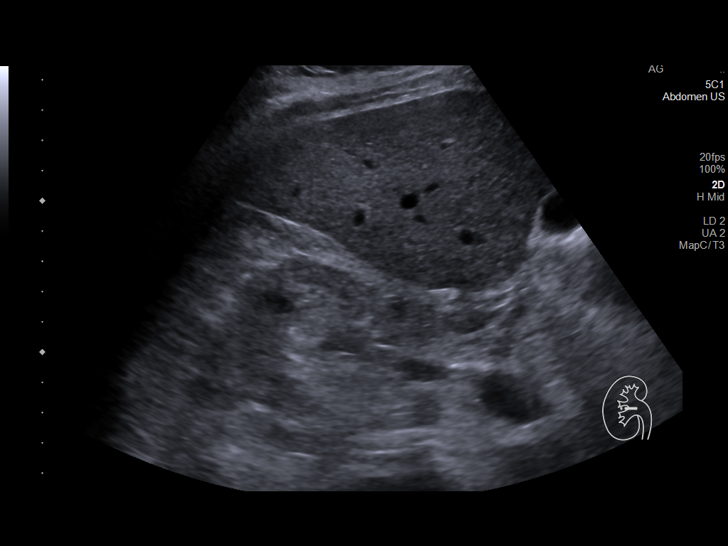
[im 27/81]
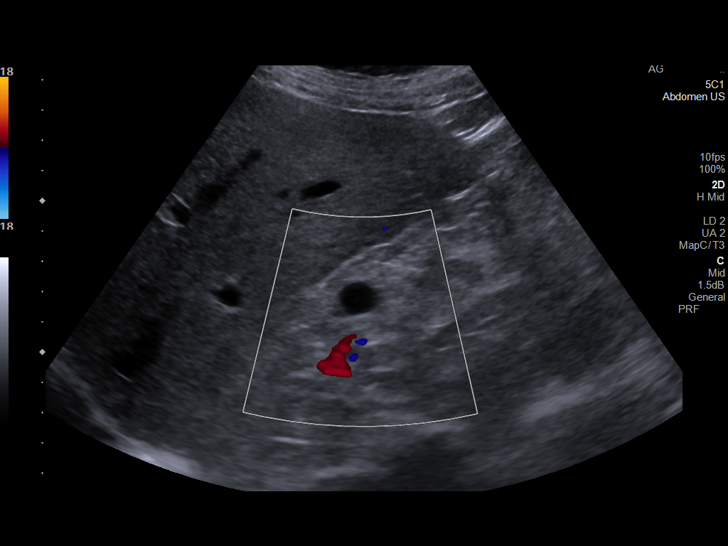
[im 31/81]
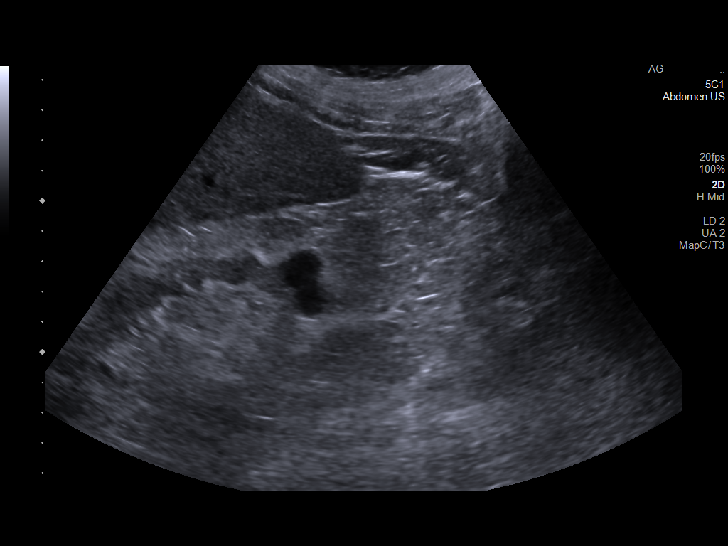
[im 37/81]
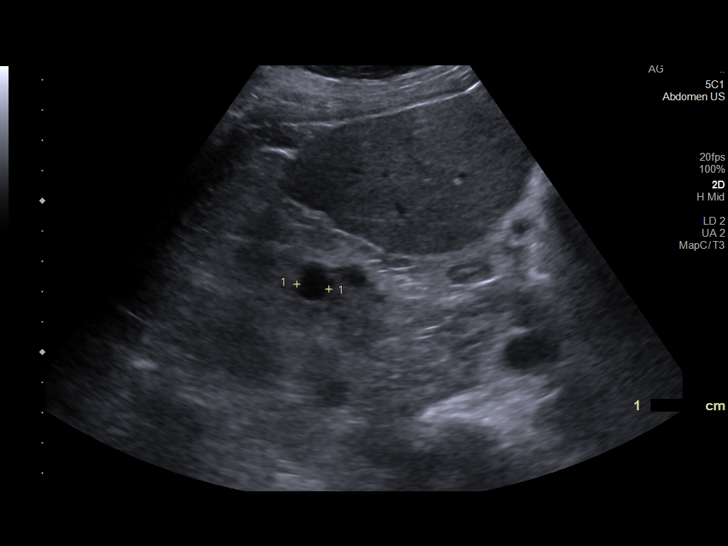
[im 44/81]
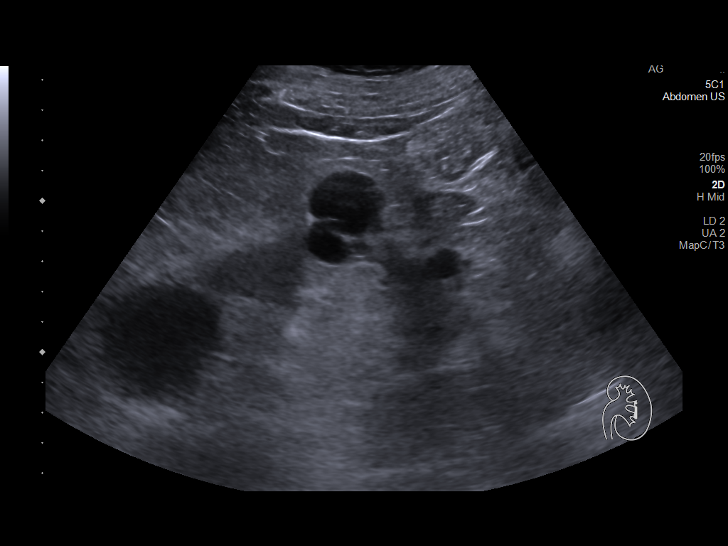
[im 51/81]
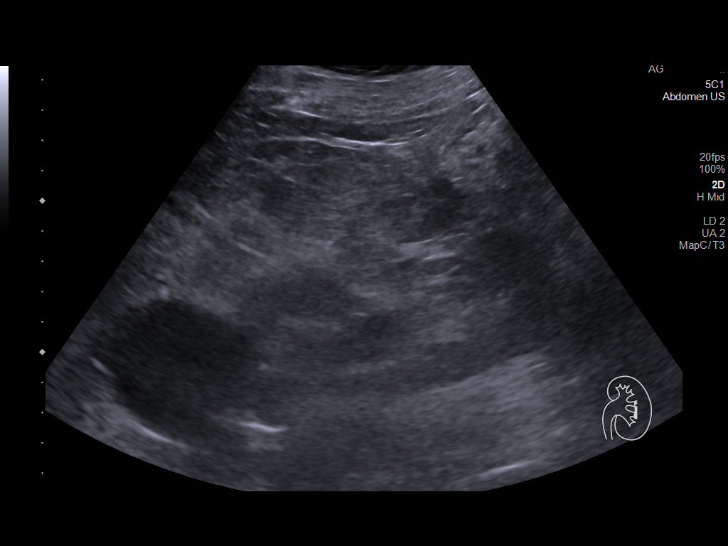
[im 54/81]
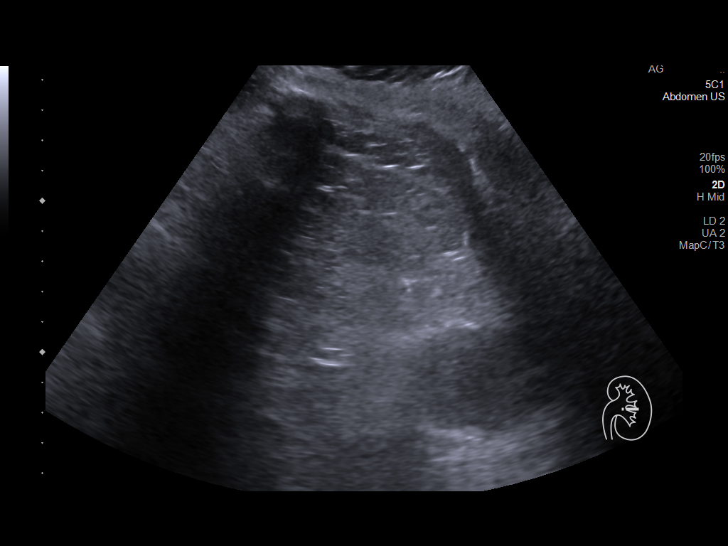
[im 61/81]
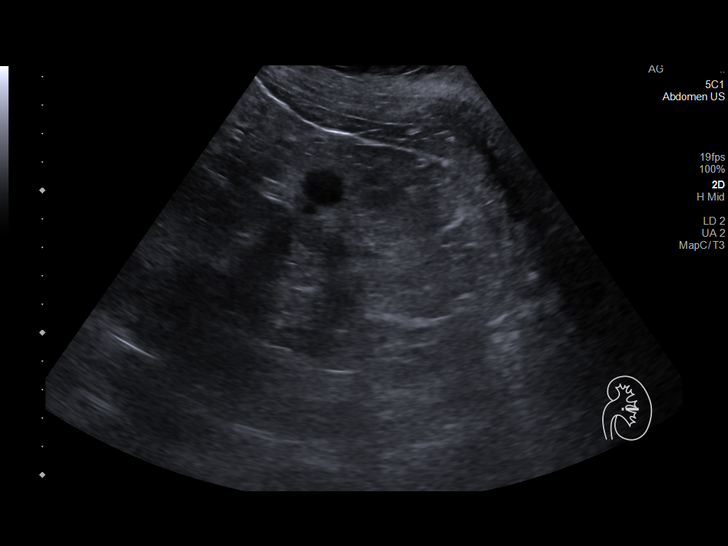
[im 67/81]
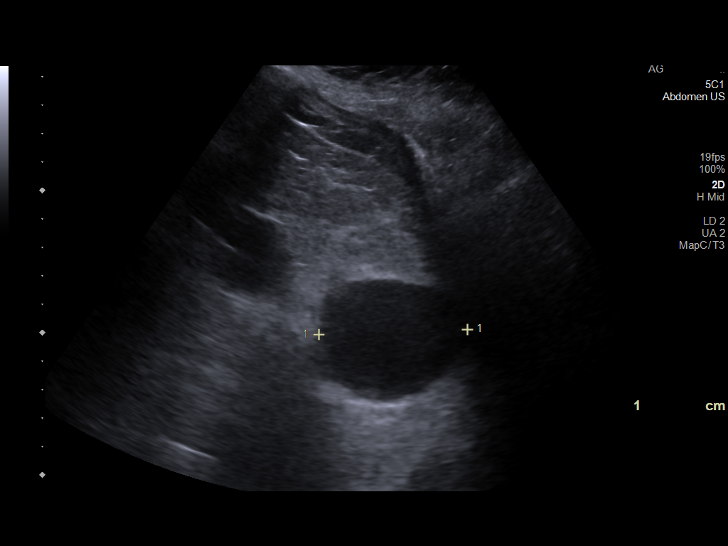
[im 74/81]
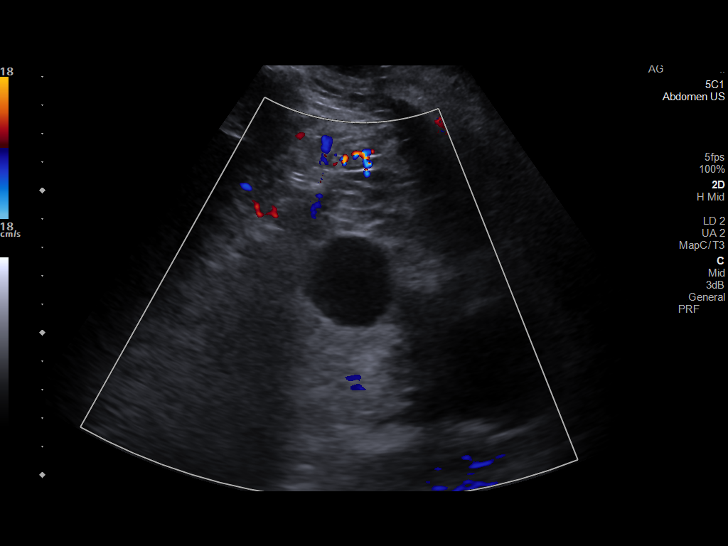
[im 81/81]
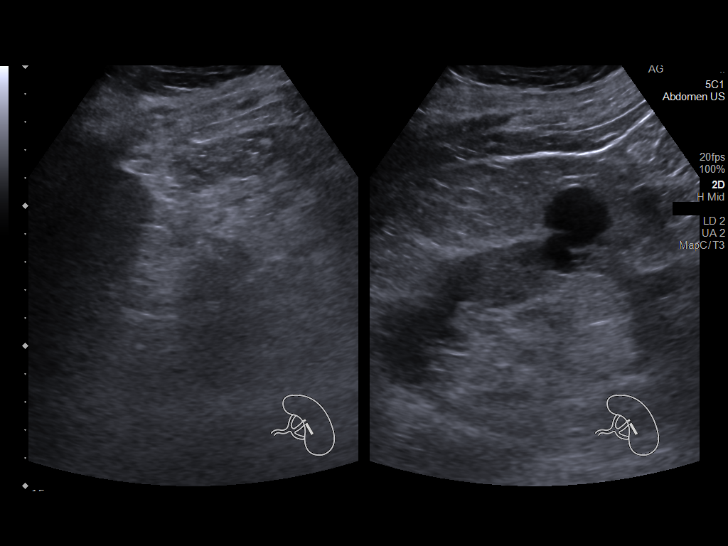

[14 of 25 positions shown; findings below may reference images not displayed]

FINDINGS: Right Kidney:

Renal measurements: 9.8 x 5.3 x 5.4 cm. = volume: 144 mL. Cortical
thinning is noted as well as increased echogenicity consistent with
medical renal disease. Simple cysts are noted measuring up to 1.9 cm
these have decreased in size when compared with the prior exam.

Left Kidney:

Renal measurements: 8.8 x 5.7 x 6.3 cm. = volume: 167 mL. Increased
echogenicity is noted. Multiple simple cysts are noted. The largest
of these measures 5.2 cm in the upper pole. No mass lesion or
hydronephrosis is noted.

Bladder:

Appears normal for degree of bladder distention.

Other:

None.
IMPRESSION: Bilateral simple cysts.

No obstructive changes are noted.

Increased echogenicity consistent with chronic medical renal
disease.

## 2022-04-07 DIAGNOSIS — Z299 Encounter for prophylactic measures, unspecified: Secondary | ICD-10-CM | POA: Diagnosis not present

## 2022-04-07 DIAGNOSIS — I4891 Unspecified atrial fibrillation: Secondary | ICD-10-CM | POA: Diagnosis not present

## 2022-04-07 DIAGNOSIS — I429 Cardiomyopathy, unspecified: Secondary | ICD-10-CM | POA: Diagnosis not present

## 2022-04-07 DIAGNOSIS — I10 Essential (primary) hypertension: Secondary | ICD-10-CM | POA: Diagnosis not present

## 2022-04-07 DIAGNOSIS — I5022 Chronic systolic (congestive) heart failure: Secondary | ICD-10-CM | POA: Diagnosis not present

## 2022-04-09 DIAGNOSIS — D696 Thrombocytopenia, unspecified: Secondary | ICD-10-CM | POA: Diagnosis not present

## 2022-04-09 DIAGNOSIS — N170 Acute kidney failure with tubular necrosis: Secondary | ICD-10-CM | POA: Diagnosis not present

## 2022-04-09 DIAGNOSIS — N189 Chronic kidney disease, unspecified: Secondary | ICD-10-CM | POA: Diagnosis not present

## 2022-04-09 DIAGNOSIS — I129 Hypertensive chronic kidney disease with stage 1 through stage 4 chronic kidney disease, or unspecified chronic kidney disease: Secondary | ICD-10-CM | POA: Diagnosis not present

## 2022-04-09 DIAGNOSIS — E8722 Chronic metabolic acidosis: Secondary | ICD-10-CM | POA: Diagnosis not present

## 2022-04-09 DIAGNOSIS — N184 Chronic kidney disease, stage 4 (severe): Secondary | ICD-10-CM | POA: Diagnosis not present

## 2022-04-09 DIAGNOSIS — R809 Proteinuria, unspecified: Secondary | ICD-10-CM | POA: Diagnosis not present

## 2022-04-09 DIAGNOSIS — D638 Anemia in other chronic diseases classified elsewhere: Secondary | ICD-10-CM | POA: Diagnosis not present

## 2022-04-09 DIAGNOSIS — I1310 Hypertensive heart and chronic kidney disease without heart failure, with stage 1 through stage 4 chronic kidney disease, or unspecified chronic kidney disease: Secondary | ICD-10-CM | POA: Diagnosis not present

## 2022-04-09 DIAGNOSIS — I5042 Chronic combined systolic (congestive) and diastolic (congestive) heart failure: Secondary | ICD-10-CM | POA: Diagnosis not present

## 2022-04-18 DIAGNOSIS — E8722 Chronic metabolic acidosis: Secondary | ICD-10-CM | POA: Diagnosis not present

## 2022-04-18 DIAGNOSIS — D696 Thrombocytopenia, unspecified: Secondary | ICD-10-CM | POA: Diagnosis not present

## 2022-04-18 DIAGNOSIS — N184 Chronic kidney disease, stage 4 (severe): Secondary | ICD-10-CM | POA: Diagnosis not present

## 2022-04-18 DIAGNOSIS — I129 Hypertensive chronic kidney disease with stage 1 through stage 4 chronic kidney disease, or unspecified chronic kidney disease: Secondary | ICD-10-CM | POA: Diagnosis not present

## 2022-04-18 DIAGNOSIS — D638 Anemia in other chronic diseases classified elsewhere: Secondary | ICD-10-CM | POA: Diagnosis not present

## 2022-04-18 DIAGNOSIS — I5042 Chronic combined systolic (congestive) and diastolic (congestive) heart failure: Secondary | ICD-10-CM | POA: Diagnosis not present

## 2022-04-18 DIAGNOSIS — R809 Proteinuria, unspecified: Secondary | ICD-10-CM | POA: Diagnosis not present

## 2022-04-21 DIAGNOSIS — Z299 Encounter for prophylactic measures, unspecified: Secondary | ICD-10-CM | POA: Diagnosis not present

## 2022-04-21 DIAGNOSIS — H6123 Impacted cerumen, bilateral: Secondary | ICD-10-CM | POA: Diagnosis not present

## 2022-04-21 DIAGNOSIS — I10 Essential (primary) hypertension: Secondary | ICD-10-CM | POA: Diagnosis not present

## 2022-04-23 DIAGNOSIS — R809 Proteinuria, unspecified: Secondary | ICD-10-CM | POA: Diagnosis not present

## 2022-04-23 DIAGNOSIS — D696 Thrombocytopenia, unspecified: Secondary | ICD-10-CM | POA: Diagnosis not present

## 2022-04-23 DIAGNOSIS — E8722 Chronic metabolic acidosis: Secondary | ICD-10-CM | POA: Diagnosis not present

## 2022-04-23 DIAGNOSIS — I5042 Chronic combined systolic (congestive) and diastolic (congestive) heart failure: Secondary | ICD-10-CM | POA: Diagnosis not present

## 2022-04-23 DIAGNOSIS — I129 Hypertensive chronic kidney disease with stage 1 through stage 4 chronic kidney disease, or unspecified chronic kidney disease: Secondary | ICD-10-CM | POA: Diagnosis not present

## 2022-04-23 DIAGNOSIS — N184 Chronic kidney disease, stage 4 (severe): Secondary | ICD-10-CM | POA: Diagnosis not present

## 2022-04-23 DIAGNOSIS — D638 Anemia in other chronic diseases classified elsewhere: Secondary | ICD-10-CM | POA: Diagnosis not present

## 2022-04-27 DIAGNOSIS — R7402 Elevation of levels of lactic acid dehydrogenase (LDH): Secondary | ICD-10-CM | POA: Diagnosis not present

## 2022-04-27 DIAGNOSIS — R092 Respiratory arrest: Secondary | ICD-10-CM | POA: Diagnosis not present

## 2022-04-27 DIAGNOSIS — Z87891 Personal history of nicotine dependence: Secondary | ICD-10-CM | POA: Diagnosis not present

## 2022-04-27 DIAGNOSIS — J440 Chronic obstructive pulmonary disease with acute lower respiratory infection: Secondary | ICD-10-CM | POA: Diagnosis present

## 2022-04-27 DIAGNOSIS — Z1152 Encounter for screening for COVID-19: Secondary | ICD-10-CM | POA: Diagnosis not present

## 2022-04-27 DIAGNOSIS — F319 Bipolar disorder, unspecified: Secondary | ICD-10-CM | POA: Diagnosis present

## 2022-04-27 DIAGNOSIS — E8722 Chronic metabolic acidosis: Secondary | ICD-10-CM | POA: Diagnosis present

## 2022-04-27 DIAGNOSIS — Z7901 Long term (current) use of anticoagulants: Secondary | ICD-10-CM | POA: Diagnosis not present

## 2022-04-27 DIAGNOSIS — R404 Transient alteration of awareness: Secondary | ICD-10-CM | POA: Diagnosis not present

## 2022-04-27 DIAGNOSIS — G629 Polyneuropathy, unspecified: Secondary | ICD-10-CM | POA: Diagnosis present

## 2022-04-27 DIAGNOSIS — I429 Cardiomyopathy, unspecified: Secondary | ICD-10-CM | POA: Diagnosis present

## 2022-04-27 DIAGNOSIS — M109 Gout, unspecified: Secondary | ICD-10-CM | POA: Diagnosis present

## 2022-04-27 DIAGNOSIS — J9602 Acute respiratory failure with hypercapnia: Secondary | ICD-10-CM | POA: Diagnosis present

## 2022-04-27 DIAGNOSIS — E872 Acidosis, unspecified: Secondary | ICD-10-CM | POA: Diagnosis not present

## 2022-04-27 DIAGNOSIS — J9600 Acute respiratory failure, unspecified whether with hypoxia or hypercapnia: Secondary | ICD-10-CM | POA: Diagnosis not present

## 2022-04-27 DIAGNOSIS — Z888 Allergy status to other drugs, medicaments and biological substances status: Secondary | ICD-10-CM | POA: Diagnosis not present

## 2022-04-27 DIAGNOSIS — F200 Paranoid schizophrenia: Secondary | ICD-10-CM | POA: Diagnosis present

## 2022-04-27 DIAGNOSIS — Z79899 Other long term (current) drug therapy: Secondary | ICD-10-CM | POA: Diagnosis not present

## 2022-04-27 DIAGNOSIS — R918 Other nonspecific abnormal finding of lung field: Secondary | ICD-10-CM | POA: Diagnosis not present

## 2022-04-27 DIAGNOSIS — R55 Syncope and collapse: Secondary | ICD-10-CM | POA: Diagnosis not present

## 2022-04-27 DIAGNOSIS — F419 Anxiety disorder, unspecified: Secondary | ICD-10-CM | POA: Diagnosis present

## 2022-04-27 DIAGNOSIS — R069 Unspecified abnormalities of breathing: Secondary | ICD-10-CM | POA: Diagnosis not present

## 2022-04-27 DIAGNOSIS — H409 Unspecified glaucoma: Secondary | ICD-10-CM | POA: Diagnosis present

## 2022-04-27 DIAGNOSIS — I2510 Atherosclerotic heart disease of native coronary artery without angina pectoris: Secondary | ICD-10-CM | POA: Diagnosis not present

## 2022-04-27 DIAGNOSIS — I129 Hypertensive chronic kidney disease with stage 1 through stage 4 chronic kidney disease, or unspecified chronic kidney disease: Secondary | ICD-10-CM | POA: Diagnosis not present

## 2022-04-27 DIAGNOSIS — J90 Pleural effusion, not elsewhere classified: Secondary | ICD-10-CM | POA: Diagnosis not present

## 2022-04-27 DIAGNOSIS — I130 Hypertensive heart and chronic kidney disease with heart failure and stage 1 through stage 4 chronic kidney disease, or unspecified chronic kidney disease: Secondary | ICD-10-CM | POA: Diagnosis present

## 2022-04-27 DIAGNOSIS — H269 Unspecified cataract: Secondary | ICD-10-CM | POA: Diagnosis present

## 2022-04-27 DIAGNOSIS — R7989 Other specified abnormal findings of blood chemistry: Secondary | ICD-10-CM | POA: Diagnosis not present

## 2022-04-27 DIAGNOSIS — J9601 Acute respiratory failure with hypoxia: Secondary | ICD-10-CM | POA: Diagnosis not present

## 2022-04-27 DIAGNOSIS — I10 Essential (primary) hypertension: Secondary | ICD-10-CM | POA: Diagnosis not present

## 2022-04-27 DIAGNOSIS — J80 Acute respiratory distress syndrome: Secondary | ICD-10-CM | POA: Diagnosis not present

## 2022-04-27 DIAGNOSIS — N186 End stage renal disease: Secondary | ICD-10-CM | POA: Diagnosis not present

## 2022-04-27 DIAGNOSIS — I252 Old myocardial infarction: Secondary | ICD-10-CM | POA: Diagnosis not present

## 2022-04-27 DIAGNOSIS — I5042 Chronic combined systolic (congestive) and diastolic (congestive) heart failure: Secondary | ICD-10-CM | POA: Diagnosis present

## 2022-04-27 DIAGNOSIS — D72829 Elevated white blood cell count, unspecified: Secondary | ICD-10-CM | POA: Diagnosis not present

## 2022-04-27 DIAGNOSIS — R0602 Shortness of breath: Secondary | ICD-10-CM | POA: Diagnosis not present

## 2022-04-27 DIAGNOSIS — D631 Anemia in chronic kidney disease: Secondary | ICD-10-CM | POA: Diagnosis not present

## 2022-04-27 DIAGNOSIS — E7800 Pure hypercholesterolemia, unspecified: Secondary | ICD-10-CM | POA: Diagnosis present

## 2022-04-27 DIAGNOSIS — I219 Acute myocardial infarction, unspecified: Secondary | ICD-10-CM | POA: Diagnosis not present

## 2022-04-27 DIAGNOSIS — J189 Pneumonia, unspecified organism: Secondary | ICD-10-CM | POA: Diagnosis present

## 2022-04-27 DIAGNOSIS — N184 Chronic kidney disease, stage 4 (severe): Secondary | ICD-10-CM | POA: Diagnosis present

## 2022-04-28 DIAGNOSIS — H409 Unspecified glaucoma: Secondary | ICD-10-CM | POA: Diagnosis present

## 2022-04-28 DIAGNOSIS — I219 Acute myocardial infarction, unspecified: Secondary | ICD-10-CM | POA: Diagnosis not present

## 2022-04-28 DIAGNOSIS — E8722 Chronic metabolic acidosis: Secondary | ICD-10-CM | POA: Diagnosis present

## 2022-04-28 DIAGNOSIS — I130 Hypertensive heart and chronic kidney disease with heart failure and stage 1 through stage 4 chronic kidney disease, or unspecified chronic kidney disease: Secondary | ICD-10-CM | POA: Diagnosis present

## 2022-04-28 DIAGNOSIS — R7989 Other specified abnormal findings of blood chemistry: Secondary | ICD-10-CM | POA: Diagnosis not present

## 2022-04-28 DIAGNOSIS — J9600 Acute respiratory failure, unspecified whether with hypoxia or hypercapnia: Secondary | ICD-10-CM | POA: Diagnosis not present

## 2022-04-28 DIAGNOSIS — E7800 Pure hypercholesterolemia, unspecified: Secondary | ICD-10-CM | POA: Diagnosis present

## 2022-04-28 DIAGNOSIS — I429 Cardiomyopathy, unspecified: Secondary | ICD-10-CM | POA: Diagnosis present

## 2022-04-28 DIAGNOSIS — J189 Pneumonia, unspecified organism: Secondary | ICD-10-CM | POA: Diagnosis present

## 2022-04-28 DIAGNOSIS — R092 Respiratory arrest: Secondary | ICD-10-CM | POA: Diagnosis not present

## 2022-04-28 DIAGNOSIS — J440 Chronic obstructive pulmonary disease with acute lower respiratory infection: Secondary | ICD-10-CM | POA: Diagnosis present

## 2022-04-28 DIAGNOSIS — M109 Gout, unspecified: Secondary | ICD-10-CM | POA: Diagnosis present

## 2022-04-28 DIAGNOSIS — D631 Anemia in chronic kidney disease: Secondary | ICD-10-CM | POA: Diagnosis present

## 2022-04-28 DIAGNOSIS — G629 Polyneuropathy, unspecified: Secondary | ICD-10-CM | POA: Diagnosis present

## 2022-04-28 DIAGNOSIS — Z79899 Other long term (current) drug therapy: Secondary | ICD-10-CM | POA: Diagnosis not present

## 2022-04-28 DIAGNOSIS — J90 Pleural effusion, not elsewhere classified: Secondary | ICD-10-CM | POA: Diagnosis not present

## 2022-04-28 DIAGNOSIS — F200 Paranoid schizophrenia: Secondary | ICD-10-CM | POA: Diagnosis present

## 2022-04-28 DIAGNOSIS — I129 Hypertensive chronic kidney disease with stage 1 through stage 4 chronic kidney disease, or unspecified chronic kidney disease: Secondary | ICD-10-CM | POA: Diagnosis not present

## 2022-04-28 DIAGNOSIS — I252 Old myocardial infarction: Secondary | ICD-10-CM | POA: Diagnosis not present

## 2022-04-28 DIAGNOSIS — D72829 Elevated white blood cell count, unspecified: Secondary | ICD-10-CM | POA: Diagnosis not present

## 2022-04-28 DIAGNOSIS — R7402 Elevation of levels of lactic acid dehydrogenase (LDH): Secondary | ICD-10-CM | POA: Diagnosis not present

## 2022-04-28 DIAGNOSIS — I2510 Atherosclerotic heart disease of native coronary artery without angina pectoris: Secondary | ICD-10-CM | POA: Diagnosis present

## 2022-04-28 DIAGNOSIS — Z7901 Long term (current) use of anticoagulants: Secondary | ICD-10-CM | POA: Diagnosis not present

## 2022-04-28 DIAGNOSIS — F319 Bipolar disorder, unspecified: Secondary | ICD-10-CM | POA: Diagnosis present

## 2022-04-28 DIAGNOSIS — Z1152 Encounter for screening for COVID-19: Secondary | ICD-10-CM | POA: Diagnosis not present

## 2022-04-28 DIAGNOSIS — E872 Acidosis, unspecified: Secondary | ICD-10-CM | POA: Diagnosis not present

## 2022-04-28 DIAGNOSIS — J9601 Acute respiratory failure with hypoxia: Secondary | ICD-10-CM | POA: Diagnosis present

## 2022-04-28 DIAGNOSIS — N184 Chronic kidney disease, stage 4 (severe): Secondary | ICD-10-CM | POA: Diagnosis present

## 2022-04-28 DIAGNOSIS — J9602 Acute respiratory failure with hypercapnia: Secondary | ICD-10-CM | POA: Diagnosis present

## 2022-04-28 DIAGNOSIS — H269 Unspecified cataract: Secondary | ICD-10-CM | POA: Diagnosis present

## 2022-04-28 DIAGNOSIS — I5042 Chronic combined systolic (congestive) and diastolic (congestive) heart failure: Secondary | ICD-10-CM | POA: Diagnosis present

## 2022-04-28 DIAGNOSIS — Z888 Allergy status to other drugs, medicaments and biological substances status: Secondary | ICD-10-CM | POA: Diagnosis not present

## 2022-04-28 DIAGNOSIS — F419 Anxiety disorder, unspecified: Secondary | ICD-10-CM | POA: Diagnosis present

## 2022-04-28 DIAGNOSIS — Z87891 Personal history of nicotine dependence: Secondary | ICD-10-CM | POA: Diagnosis not present

## 2022-04-29 DIAGNOSIS — D631 Anemia in chronic kidney disease: Secondary | ICD-10-CM | POA: Diagnosis not present

## 2022-04-29 DIAGNOSIS — J9600 Acute respiratory failure, unspecified whether with hypoxia or hypercapnia: Secondary | ICD-10-CM | POA: Diagnosis not present

## 2022-04-29 DIAGNOSIS — F200 Paranoid schizophrenia: Secondary | ICD-10-CM | POA: Diagnosis not present

## 2022-04-29 DIAGNOSIS — J189 Pneumonia, unspecified organism: Secondary | ICD-10-CM | POA: Diagnosis not present

## 2022-04-29 DIAGNOSIS — I5042 Chronic combined systolic (congestive) and diastolic (congestive) heart failure: Secondary | ICD-10-CM | POA: Diagnosis not present

## 2022-04-29 DIAGNOSIS — E872 Acidosis, unspecified: Secondary | ICD-10-CM | POA: Diagnosis not present

## 2022-04-29 DIAGNOSIS — N184 Chronic kidney disease, stage 4 (severe): Secondary | ICD-10-CM | POA: Diagnosis not present

## 2022-04-29 DIAGNOSIS — Z79899 Other long term (current) drug therapy: Secondary | ICD-10-CM | POA: Diagnosis not present

## 2022-05-01 DIAGNOSIS — N184 Chronic kidney disease, stage 4 (severe): Secondary | ICD-10-CM | POA: Diagnosis not present

## 2022-05-01 DIAGNOSIS — D638 Anemia in other chronic diseases classified elsewhere: Secondary | ICD-10-CM | POA: Diagnosis not present

## 2022-05-01 DIAGNOSIS — I129 Hypertensive chronic kidney disease with stage 1 through stage 4 chronic kidney disease, or unspecified chronic kidney disease: Secondary | ICD-10-CM | POA: Diagnosis not present

## 2022-05-02 DIAGNOSIS — D696 Thrombocytopenia, unspecified: Secondary | ICD-10-CM | POA: Diagnosis not present

## 2022-05-02 DIAGNOSIS — D638 Anemia in other chronic diseases classified elsewhere: Secondary | ICD-10-CM | POA: Diagnosis not present

## 2022-05-02 DIAGNOSIS — I5042 Chronic combined systolic (congestive) and diastolic (congestive) heart failure: Secondary | ICD-10-CM | POA: Diagnosis not present

## 2022-05-02 DIAGNOSIS — I129 Hypertensive chronic kidney disease with stage 1 through stage 4 chronic kidney disease, or unspecified chronic kidney disease: Secondary | ICD-10-CM | POA: Diagnosis not present

## 2022-05-02 DIAGNOSIS — N184 Chronic kidney disease, stage 4 (severe): Secondary | ICD-10-CM | POA: Diagnosis not present

## 2022-05-02 DIAGNOSIS — E211 Secondary hyperparathyroidism, not elsewhere classified: Secondary | ICD-10-CM | POA: Diagnosis not present

## 2022-05-02 DIAGNOSIS — R809 Proteinuria, unspecified: Secondary | ICD-10-CM | POA: Diagnosis not present

## 2022-05-06 ENCOUNTER — Emergency Department (HOSPITAL_COMMUNITY): Payer: Medicare Other

## 2022-05-06 ENCOUNTER — Inpatient Hospital Stay (HOSPITAL_COMMUNITY)
Admission: EM | Admit: 2022-05-06 | Discharge: 2022-05-09 | DRG: 291 | Disposition: A | Discharge status: Home / Self Care | Payer: Medicare Other | Attending: Internal Medicine | Admitting: Internal Medicine

## 2022-05-06 ENCOUNTER — Other Ambulatory Visit: Payer: Self-pay

## 2022-05-06 ENCOUNTER — Encounter (HOSPITAL_COMMUNITY): Payer: Self-pay | Admitting: Internal Medicine

## 2022-05-06 ENCOUNTER — Ambulatory Visit: Payer: Medicare Other | Admitting: Medical

## 2022-05-06 DIAGNOSIS — I5043 Acute on chronic combined systolic (congestive) and diastolic (congestive) heart failure: Secondary | ICD-10-CM | POA: Diagnosis present

## 2022-05-06 DIAGNOSIS — I2510 Atherosclerotic heart disease of native coronary artery without angina pectoris: Secondary | ICD-10-CM | POA: Diagnosis present

## 2022-05-06 DIAGNOSIS — Z79899 Other long term (current) drug therapy: Secondary | ICD-10-CM | POA: Diagnosis not present

## 2022-05-06 DIAGNOSIS — I110 Hypertensive heart disease with heart failure: Secondary | ICD-10-CM | POA: Diagnosis not present

## 2022-05-06 DIAGNOSIS — E785 Hyperlipidemia, unspecified: Secondary | ICD-10-CM | POA: Diagnosis not present

## 2022-05-06 DIAGNOSIS — Z7901 Long term (current) use of anticoagulants: Secondary | ICD-10-CM

## 2022-05-06 DIAGNOSIS — Z87891 Personal history of nicotine dependence: Secondary | ICD-10-CM

## 2022-05-06 DIAGNOSIS — I252 Old myocardial infarction: Secondary | ICD-10-CM | POA: Diagnosis not present

## 2022-05-06 DIAGNOSIS — Z888 Allergy status to other drugs, medicaments and biological substances status: Secondary | ICD-10-CM | POA: Diagnosis not present

## 2022-05-06 DIAGNOSIS — J90 Pleural effusion, not elsewhere classified: Secondary | ICD-10-CM | POA: Diagnosis not present

## 2022-05-06 DIAGNOSIS — I080 Rheumatic disorders of both mitral and aortic valves: Secondary | ICD-10-CM | POA: Diagnosis present

## 2022-05-06 DIAGNOSIS — R0602 Shortness of breath: Secondary | ICD-10-CM | POA: Diagnosis not present

## 2022-05-06 DIAGNOSIS — D696 Thrombocytopenia, unspecified: Secondary | ICD-10-CM | POA: Diagnosis present

## 2022-05-06 DIAGNOSIS — I10 Essential (primary) hypertension: Secondary | ICD-10-CM | POA: Diagnosis present

## 2022-05-06 DIAGNOSIS — J9601 Acute respiratory failure with hypoxia: Secondary | ICD-10-CM | POA: Diagnosis present

## 2022-05-06 DIAGNOSIS — Z23 Encounter for immunization: Secondary | ICD-10-CM | POA: Diagnosis not present

## 2022-05-06 DIAGNOSIS — R7989 Other specified abnormal findings of blood chemistry: Secondary | ICD-10-CM | POA: Diagnosis present

## 2022-05-06 DIAGNOSIS — I2489 Other forms of acute ischemic heart disease: Secondary | ICD-10-CM | POA: Diagnosis present

## 2022-05-06 DIAGNOSIS — I509 Heart failure, unspecified: Principal | ICD-10-CM

## 2022-05-06 DIAGNOSIS — F200 Paranoid schizophrenia: Secondary | ICD-10-CM | POA: Diagnosis not present

## 2022-05-06 DIAGNOSIS — I340 Nonrheumatic mitral (valve) insufficiency: Secondary | ICD-10-CM | POA: Diagnosis not present

## 2022-05-06 DIAGNOSIS — K219 Gastro-esophageal reflux disease without esophagitis: Secondary | ICD-10-CM | POA: Diagnosis not present

## 2022-05-06 DIAGNOSIS — I739 Peripheral vascular disease, unspecified: Secondary | ICD-10-CM | POA: Diagnosis present

## 2022-05-06 DIAGNOSIS — I130 Hypertensive heart and chronic kidney disease with heart failure and stage 1 through stage 4 chronic kidney disease, or unspecified chronic kidney disease: Principal | ICD-10-CM | POA: Diagnosis present

## 2022-05-06 DIAGNOSIS — M109 Gout, unspecified: Secondary | ICD-10-CM | POA: Diagnosis not present

## 2022-05-06 DIAGNOSIS — Z86718 Personal history of other venous thrombosis and embolism: Secondary | ICD-10-CM

## 2022-05-06 DIAGNOSIS — J811 Chronic pulmonary edema: Secondary | ICD-10-CM | POA: Diagnosis not present

## 2022-05-06 DIAGNOSIS — I5040 Unspecified combined systolic (congestive) and diastolic (congestive) heart failure: Secondary | ICD-10-CM

## 2022-05-06 DIAGNOSIS — R197 Diarrhea, unspecified: Secondary | ICD-10-CM | POA: Diagnosis present

## 2022-05-06 DIAGNOSIS — R062 Wheezing: Secondary | ICD-10-CM | POA: Diagnosis not present

## 2022-05-06 DIAGNOSIS — I255 Ischemic cardiomyopathy: Secondary | ICD-10-CM | POA: Diagnosis present

## 2022-05-06 DIAGNOSIS — N184 Chronic kidney disease, stage 4 (severe): Secondary | ICD-10-CM | POA: Diagnosis present

## 2022-05-06 DIAGNOSIS — D509 Iron deficiency anemia, unspecified: Secondary | ICD-10-CM | POA: Diagnosis not present

## 2022-05-06 DIAGNOSIS — I5021 Acute systolic (congestive) heart failure: Secondary | ICD-10-CM | POA: Diagnosis not present

## 2022-05-06 HISTORY — DX: Unspecified combined systolic (congestive) and diastolic (congestive) heart failure: I50.40

## 2022-05-06 LAB — BASIC METABOLIC PANEL
Anion gap: 7 (ref 5–15)
BUN: 24 mg/dL — ABNORMAL HIGH (ref 8–23)
CO2: 23 mmol/L (ref 22–32)
Calcium: 8.5 mg/dL — ABNORMAL LOW (ref 8.9–10.3)
Chloride: 111 mmol/L (ref 98–111)
Creatinine, Ser: 3.25 mg/dL — ABNORMAL HIGH (ref 0.61–1.24)
GFR, Estimated: 20 mL/min — ABNORMAL LOW (ref >60)
Glucose, Bld: 111 mg/dL — ABNORMAL HIGH (ref 70–99)
Potassium: 4.1 mmol/L (ref 3.5–5.1)
Sodium: 141 mmol/L (ref 135–145)

## 2022-05-06 LAB — URINALYSIS, ROUTINE W REFLEX MICROSCOPIC
Bacteria, UA: NONE SEEN
Bilirubin Urine: NEGATIVE
Glucose, UA: NEGATIVE mg/dL
Hgb urine dipstick: NEGATIVE
Ketones, ur: NEGATIVE mg/dL
Leukocytes,Ua: NEGATIVE
Nitrite: NEGATIVE
Protein, ur: 100 mg/dL — AB
Specific Gravity, Urine: 1.005 (ref 1.005–1.030)
pH: 5 (ref 5.0–8.0)

## 2022-05-06 LAB — BRAIN NATRIURETIC PEPTIDE: B Natriuretic Peptide: 1699.1 pg/mL — ABNORMAL HIGH (ref 0.0–100.0)

## 2022-05-06 LAB — TSH: TSH: 1.413 u[IU]/mL (ref 0.350–4.500)

## 2022-05-06 LAB — CBC
HCT: 32.5 % — ABNORMAL LOW (ref 39.0–52.0)
Hemoglobin: 9.5 g/dL — ABNORMAL LOW (ref 13.0–17.0)
MCH: 29.5 pg (ref 26.0–34.0)
MCHC: 29.2 g/dL — ABNORMAL LOW (ref 30.0–36.0)
MCV: 100.9 fL — ABNORMAL HIGH (ref 80.0–100.0)
Platelets: 113 10*3/uL — ABNORMAL LOW (ref 150–400)
RBC: 3.22 MIL/uL — ABNORMAL LOW (ref 4.22–5.81)
RDW: 15.9 % — ABNORMAL HIGH (ref 11.5–15.5)
WBC: 7.4 10*3/uL (ref 4.0–10.5)
nRBC: 0 % (ref 0.0–0.2)

## 2022-05-06 LAB — PROTIME-INR
INR: 1.4 — ABNORMAL HIGH (ref 0.8–1.2)
Prothrombin Time: 16.6 seconds — ABNORMAL HIGH (ref 11.4–15.2)

## 2022-05-06 LAB — TROPONIN I (HIGH SENSITIVITY)
Troponin I (High Sensitivity): 55 ng/L — ABNORMAL HIGH (ref <18)
Troponin I (High Sensitivity): 58 ng/L — ABNORMAL HIGH (ref <18)

## 2022-05-06 LAB — HIV ANTIBODY (ROUTINE TESTING W REFLEX): HIV Screen 4th Generation wRfx: NONREACTIVE

## 2022-05-06 LAB — PROCALCITONIN: Procalcitonin: 0.47 ng/mL

## 2022-05-06 MED ORDER — OLANZAPINE 5 MG PO TABS
5.0000 mg | ORAL_TABLET | Freq: Every day | ORAL | Status: DC
  Administered 2022-05-06 – 2022-05-09 (×4): 5 mg via ORAL
  Filled 2022-05-06 (×4): qty 1

## 2022-05-06 MED ORDER — SERTRALINE HCL 100 MG PO TABS
100.0000 mg | ORAL_TABLET | Freq: Every day | ORAL | Status: DC
  Administered 2022-05-06 – 2022-05-09 (×4): 100 mg via ORAL
  Filled 2022-05-06 (×4): qty 1

## 2022-05-06 MED ORDER — OLANZAPINE 5 MG PO TABS
15.0000 mg | ORAL_TABLET | Freq: Every day | ORAL | Status: DC
  Administered 2022-05-06 – 2022-05-08 (×3): 15 mg via ORAL
  Filled 2022-05-06 (×2): qty 1
  Filled 2022-05-06: qty 2
  Filled 2022-05-06: qty 1

## 2022-05-06 MED ORDER — METOPROLOL SUCCINATE ER 100 MG PO TB24
100.0000 mg | ORAL_TABLET | Freq: Every day | ORAL | Status: DC
  Administered 2022-05-06 – 2022-05-09 (×4): 100 mg via ORAL
  Filled 2022-05-06 (×4): qty 1

## 2022-05-06 MED ORDER — APIXABAN 2.5 MG PO TABS
2.5000 mg | ORAL_TABLET | Freq: Two times a day (BID) | ORAL | Status: DC
  Administered 2022-05-06 – 2022-05-09 (×7): 2.5 mg via ORAL
  Filled 2022-05-06 (×7): qty 1

## 2022-05-06 MED ORDER — ACETAMINOPHEN 325 MG PO TABS: 650.0000 mg | ORAL_TABLET | Freq: Four times a day (QID) | ORAL | Status: DC | PRN

## 2022-05-06 MED ORDER — GUAIFENESIN ER 600 MG PO TB12
600.0000 mg | ORAL_TABLET | Freq: Two times a day (BID) | ORAL | Status: DC
  Administered 2022-05-06 – 2022-05-09 (×7): 600 mg via ORAL
  Filled 2022-05-06 (×7): qty 1

## 2022-05-06 MED ORDER — SODIUM CHLORIDE 0.9 % IV SOLN
250.0000 mg | Freq: Every day | INTRAVENOUS | Status: AC
  Administered 2022-05-06 – 2022-05-07 (×2): 250 mg via INTRAVENOUS
  Filled 2022-05-06 (×2): qty 20

## 2022-05-06 MED ORDER — ROSUVASTATIN CALCIUM 20 MG PO TABS
20.0000 mg | ORAL_TABLET | Freq: Every day | ORAL | Status: DC
  Administered 2022-05-06 – 2022-05-08 (×3): 20 mg via ORAL
  Filled 2022-05-06 (×3): qty 1

## 2022-05-06 MED ORDER — ISOSORBIDE MONONITRATE 20 MG PO TABS
20.0000 mg | ORAL_TABLET | Freq: Two times a day (BID) | ORAL | Status: DC
  Administered 2022-05-06 – 2022-05-09 (×7): 20 mg via ORAL
  Filled 2022-05-06 (×9): qty 1

## 2022-05-06 MED ORDER — HEPARIN SODIUM (PORCINE) 5000 UNIT/ML IJ SOLN
5000.0000 [IU] | Freq: Three times a day (TID) | INTRAMUSCULAR | Status: DC
  Administered 2022-05-06: 5000 [IU] via SUBCUTANEOUS
  Filled 2022-05-06: qty 1

## 2022-05-06 MED ORDER — SACCHAROMYCES BOULARDII 250 MG PO CAPS
250.0000 mg | ORAL_CAPSULE | Freq: Two times a day (BID) | ORAL | Status: DC
  Administered 2022-05-06 – 2022-05-09 (×7): 250 mg via ORAL
  Filled 2022-05-06 (×9): qty 1

## 2022-05-06 MED ORDER — FUROSEMIDE 10 MG/ML IJ SOLN
80.0000 mg | Freq: Once | INTRAMUSCULAR | Status: AC
  Administered 2022-05-06: 80 mg via INTRAVENOUS
  Filled 2022-05-06: qty 8

## 2022-05-06 MED ORDER — ACETAMINOPHEN 650 MG RE SUPP: 650.0000 mg | Freq: Four times a day (QID) | RECTAL | Status: DC | PRN

## 2022-05-06 MED ORDER — FUROSEMIDE 10 MG/ML IJ SOLN
40.0000 mg | Freq: Two times a day (BID) | INTRAMUSCULAR | Status: DC
  Administered 2022-05-06: 40 mg via INTRAVENOUS
  Filled 2022-05-06: qty 4

## 2022-05-06 MED ORDER — SODIUM CHLORIDE 0.9% FLUSH
3.0000 mL | Freq: Two times a day (BID) | INTRAVENOUS | Status: DC
  Administered 2022-05-06 – 2022-05-09 (×7): 3 mL via INTRAVENOUS

## 2022-05-06 MED ORDER — SODIUM BICARBONATE 650 MG PO TABS
650.0000 mg | ORAL_TABLET | Freq: Two times a day (BID) | ORAL | Status: DC
  Administered 2022-05-06 – 2022-05-09 (×7): 650 mg via ORAL
  Filled 2022-05-06 (×7): qty 1

## 2022-05-06 MED ORDER — RISPERIDONE 1 MG PO TABS
1.0000 mg | ORAL_TABLET | Freq: Every day | ORAL | Status: DC
  Administered 2022-05-06 – 2022-05-08 (×3): 1 mg via ORAL
  Filled 2022-05-06 (×4): qty 1

## 2022-05-06 MED ORDER — ALBUTEROL SULFATE (2.5 MG/3ML) 0.083% IN NEBU: 2.5000 mg | INHALATION_SOLUTION | Freq: Four times a day (QID) | RESPIRATORY_TRACT | Status: DC | PRN

## 2022-05-06 MED ORDER — SODIUM CHLORIDE 0.9 % IV SOLN
250.0000 mg | Freq: Every day | INTRAVENOUS | Status: DC
  Filled 2022-05-06: qty 20

## 2022-05-06 NOTE — Consult Note (Signed)
Cardiology Consultation   Patient ID: Glenn Hawkins MRN: [id number]; DOB: [birth year]  Admit date: 05/06/2022 Date of Consult: 05/06/2022  PCP:  Glenda Chroman, MD   [place] [facility] Cardiologist:  Kathlyn Sacramento, MD        Patient Profile:   Glenn Hawkins is a 67 y.o. male with a hx of CAD, stage IV CKD, history of DVT August 2016, PAD, chronic combined systolic and diastolic heart failure, paranoid schizophrenia, hypertension and hyperlipidemia who is being seen 05/06/2022 for the evaluation of dyspnea at the request of Dr. Tamala Julian.  History of Present Illness:   Glenn Hawkins is a 67 year old male with past medical history of CAD, stage IV CKD, history of DVT August 2016, PAD, chronic combined systolic and diastolic heart failure, paranoid schizophrenia, hypertension and hyperlipidemia.  He had a late presenting MI in January 2013, he opted to defer cardiac catheterization due to late presentation and CKD with creatinine 2.5 at the time.  Subsequent Myoview in January 2013 showed a large transmural infarct in the left circumflex territory without significant reversible ischemia, EF 40 to 45% with moderate MR.  He had extensive right lower extremity DVT in August 2016 and he has been on anticoagulation therapy since.  Most recent echocardiogram in July 2021 showed EF 30 to 35% with akinesis of the inferolateral wall, mild MR and mild to moderate AI.  CT of abdomen at [facility] showed incidental finding of significant distal abdominal aortic narrowing extending into bilateral common iliac artery.  His renal function has been followed by Dr. Theador Hawthorne, most recent baseline creatinine has been around 3.1.  Patient was hospitalized at [facility] on 05/24/2022 due to multifocal pneumonia.  He was very close to getting intubated, however improved with CPAP therapy and antibiotic.  He did not require oxygen on discharge.  He will was most recently seen by Dr. Theador Hawthorne on 05/02/2022, he was started on 20  mg twice daily of Lasix.  According to the patient, prior to his recent hospitalization, he does have shortness of breath after walking for distance, since his released from the hospitalization, he never returned back to the previous baseline.  He gets short of breath with walking small distances.  In the past few days, he has been having increasing shortness of breath when laying down at night and also have productive white frothy phlegm with cough.  Around 2:30 AM this morning, he woke up feeling short of breath, this prompted the patient to seek urgent medical attention at [facility], ED.  On arrival, his creatinine was 3.25 which is near his baseline.  Serial troponin 55--> 58.  BNP of 1699.  Hemoglobin 9.5, platelet 113.  Chest x-ray showed cardiomegaly with pulmonary vascular congestion, interstitial and airspace opacity in the lungs bilaterally, small bilateral pleural effusion.  On physical exam, he has no crackles but does have diminished breath sounds in bilateral bases of the lung.  He has mild right neck JVD.  He has no lower extremity edema.  His abdomen is distended but remains soft.  Cardiology service consulted for possible heart failure.   Past Medical History:  Diagnosis Date   Aortic insufficiency    a. 08/2019 Echo: mild to mod AI.   C. difficile diarrhea 07/2020   CKD (chronic kidney disease) stage 3, GFR 30-59 ml/min (HCC)    baseline Cr around 2.5   Combined systolic and diastolic congestive heart failure ([place]) 05/06/2022   Coronary atherosclerosis of native coronary artery  a. 02/2011 Late presentation MI-->Myoview w/ large area of transmural infarct in LCX distribution, no ischemia.   Depressive disorder, not elsewhere classified    Esophageal reflux    Gout, unspecified    Hemorrhoids    a. 01/2016 s/p hemorrhoidectomy.   HFrEF (heart failure with reduced ejection fraction) ([place])    a. 04/2012 Echo: EF 40%, inflat AK, Gr1 DD, mild AI/MR, triv TR; b. 11/2017 EchoP EF  30-35%, inflat HK; c. 08/2019 Echo: EF 30-35%, gr1 DD, inflat AK.   History of DVT (deep vein thrombosis)    a. Chronic Eliquis.   Hyperlipidemia LDL goal <70    Hypertension    Hypotension, unspecified    Ischemic cardiomyopathy    a. 04/2012 Echo: EF 40%, inflat AK, Gr1 DD, mild AI/MR, triv TR; b. 11/2017 EchoP EF 30-35%, inflat HK; c. 08/2019 Echo: EF 30-35%, gr1 DD, inflat AK. Nl RV size/fxn. Mild MR. Mild to mod AI.   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 04/2012 Echo: EF 40%, mild MR.   Myocardial infarction (lateral wall) ([place]) 2013   a. 02/2011 - late presentation. Managed conservatively 2/2 CKD;  b. 02/2011 Myoview: Large transmural infarct in the LCX distribution, no ischemia.   PAD (peripheral artery disease) ([place])    a. 08/2015 ABI/duplex: R: 0.86, L 0.96. Duplex w/ bilateral heterogeneous plaque in mid fem arteries, no significant stenoses; b. 08/2019 ABI/Duplex: prob bilat inflow dzs w/ stable ABIs (R 0.85, L 0.93).   Personal history of tobacco use, presenting hazards to health    Torn ligament    Unspecified schizophrenia, unspecified condition     Past Surgical History:  Procedure Laterality Date   BIOPSY  12/17/2020   Procedure: BIOPSY;  Surgeon: Eloise Harman, DO;  [facility];  Service: Endoscopy;;   COLONOSCOPY     COLONOSCOPY N/A 11/10/2014   Procedure: COLONOSCOPY;  Surgeon: Manus Gunning, MD;  Location: [place];  Service: Gastroenterology;  Laterality: N/A;   COLONOSCOPY WITH PROPOFOL N/A 05/08/2015   Surgeon: [name], MD; nonthrombosed external hemorrhoids, one 8 mm tubular adenoma, one 4 mm tubular adenoma.  Repeat colonoscopy in 5-10 years.   COLONOSCOPY WITH PROPOFOL N/A 12/17/2020   Procedure: COLONOSCOPY WITH PROPOFOL;  Surgeon: Eloise Harman, DO;  [facility];  Service: Endoscopy;  Laterality: N/A;  8:30am   ESOPHAGOGASTRODUODENOSCOPY (EGD) WITH PROPOFOL N/A 05/08/2015   Surgeon: [name], MD; LA grade a  reflux esophagitis, normal stomach and duodenum.   ESOPHAGOGASTRODUODENOSCOPY (EGD) WITH PROPOFOL N/A 12/17/2020   Procedure: ESOPHAGOGASTRODUODENOSCOPY (EGD) WITH PROPOFOL;  Surgeon: Eloise Harman, DO;  [facility];  Service: Endoscopy;  Laterality: N/A;   EYE SURGERY Right    removal of foreign body   HEMORRHOID SURGERY N/A 02/20/2016   Procedure: EXTENSIVE HEMORRHOIDECTOMY;  Surgeon: [name], MD;  Location: [facility];  Service: General;  Laterality: N/A;   None     POLYPECTOMY  05/08/2015   Procedure: POLYPECTOMY;  Surgeon: [name], MD;  [facility];  Service: Endoscopy;;  transverse colon polyp     Home Medications:  Prior to Admission medications   Medication Sig Start Date End Date Taking? Authorizing Provider  amLODipine (NORVASC) 10 MG tablet Take 10 mg by mouth daily.    [provider]  apixaban (ELIQUIS) 2.5 MG TABS tablet Take 2.5 mg by mouth 2 (two) times daily.    [provider]  ARIPiprazole (ABILIFY) 20 MG tablet Take  20 mg by mouth daily.    [provider]  buPROPion ER (WELLBUTRIN SR) 100 MG 12 hr tablet Take 100 mg by mouth 2 (two) times daily. 08/20/20   [provider]  Cholecalciferol 50 MCG (2000 UT) TABS Take 2,000 Units by mouth daily.    [provider]  COLCRYS 0.6 MG tablet Take 0.6 mg by mouth 2 (two) times daily. 05/22/15   [provider]  esomeprazole (NEXIUM) 20 MG capsule Take 1 capsule (20 mg total) by mouth daily before breakfast. Patient taking differently: Take 20 mg by mouth daily as needed (acid reflux). 11/08/20   Erenest Rasher, PA-C  isosorbide mononitrate (IMDUR) 60 MG 24 hr tablet Take 1 tablet (60 mg total) by mouth every morning. Patient needs appointment for further refills. 1 st attempt 12/05/21   Wellington Hampshire, MD  lactulose (CHRONULAC) 10 GM/15ML solution Take 15 mLs (10 g total) by mouth 2 (two) times daily. Patient not taking: No sig reported  11/09/20   Erenest Rasher, PA-C  latanoprost (XALATAN) 0.005 % ophthalmic solution Place 1 drop into both eyes at bedtime. 10/11/20   [provider]  LORazepam (ATIVAN) 2 MG tablet Take 2 mg by mouth at bedtime. '2mg'$  at night    [provider]  metoprolol tartrate (LOPRESSOR) 100 MG tablet Take 100 mg by mouth 2 (two) times daily. 10/11/20   [provider]  nitroGLYCERIN (NITROSTAT) 0.4 MG SL tablet Place 1 tablet (0.4 mg total) under the tongue every 5 (five) minutes as needed for chest pain. Patient not taking: Reported on 12/11/2020 03/21/16   Wellington Hampshire, MD  QUEtiapine (SEROQUEL) 300 MG tablet Take 450 mg by mouth at bedtime. 10/06/17   [provider]  rosuvastatin (CRESTOR) 40 MG tablet Take 40 mg by mouth daily.    [provider]  sertraline (ZOLOFT) 100 MG tablet Take 200 mg by mouth daily.    [provider]  sodium bicarbonate 650 MG tablet Take 650 mg by mouth 2 (two) times daily.    [provider]    Inpatient Medications: Scheduled Meds:  furosemide  40 mg Intravenous BID   guaiFENesin  600 mg Oral BID   heparin  5,000 Units Subcutaneous Q8H   isosorbide mononitrate  20 mg Oral BID   metoprolol succinate  100 mg Oral Daily   OLANZapine  15 mg Oral QHS   OLANZapine  5 mg Oral Daily   risperiDONE  1 mg Oral QHS   rosuvastatin  20 mg Oral Daily   saccharomyces boulardii  250 mg Oral BID   sertraline  100 mg Oral Daily   sodium bicarbonate  650 mg Oral BID   sodium chloride flush  3 mL Intravenous Q12H   Continuous Infusions:  ferric gluconate (FERRLECIT) IVPB     PRN Meds: acetaminophen **OR** acetaminophen, albuterol  Allergies:    Allergies  Allergen Reactions   Nifedipine Diarrhea   Rabeprazole Other (See Comments)    unknown   Benazepril Other (See Comments)    unknown   Haloperidol Anxiety    Causes severe anxiety   Omeprazole Other (See Comments)    unknown    Social History:    Social History   Socioeconomic History   Marital status: Divorced    Spouse name: Not on file   Number of children: Not on file   Years of education: Not on file   Highest education level: Not on file  Occupational History  Not on file  Tobacco Use   Smoking status: Former    Packs/day: 1.00    Years: 23.00    Total pack years: 23.00    Types: Cigarettes    Quit date: 02/25/1996    Years since quitting: 26.2   Smokeless tobacco: Never   Tobacco comments:    + 15 years of smoking  Vaping Use   Vaping Use: Never used  Substance and Sexual Activity   Alcohol use: No    Comment: Daily alcohol use 20+ years ago   Drug use: Not Currently    Types: Cocaine    Comment: History of intranasal cocaine and speed 20+ years ago.   Sexual activity: Not Currently    Birth control/protection: None  Other Topics Concern   Not on file  Social History Narrative   Not on file   Social Determinants of Health   Financial Resource Strain: Not on file  Food Insecurity: No Food Insecurity (11/26/2020)   Hunger Vital Sign    Worried About Running Out of Food in the Last Year: Never true    Ran Out of Food in the Last Year: Never true  Transportation Needs: Not on file  Physical Activity: Not on file  Stress: Not on file  Social Connections: Not on file  Intimate Partner Violence: Not on file    Family History:    Family History  Problem Relation Age of Onset   Crohn's disease Sister    Colon cancer Neg Hx      ROS:  Please see the history of present illness.   All other ROS reviewed and negative.     Physical Exam/Data:   Vitals:   05/06/22 0700 05/06/22 0715 05/06/22 0848 05/06/22 0939  BP: (!) 144/91 (!) 136/90  (!) 166/92  Pulse: 66 64  67  Resp: 15 15    Temp:   98.3 F (36.8 C)   TempSrc:   Oral   SpO2: 94% 95%  97%   No intake or output data in the 24 hours ending 05/06/22 1427    12/13/2020    9:12 AM 11/29/2020   10:09 AM 11/08/2020   10:05 AM  Last 3  Weights  Weight (lbs) 185 lb 184 lb 8 oz 166 lb 12.8 oz  Weight (kg) 83.915 kg 83.689 kg 75.66 kg     There is no height or weight on file to calculate BMI.  General:  Well nourished, well developed, in no acute distress HEENT: normal Neck: no JVD Vascular: No carotid bruits; Distal pulses 2+ bilaterally Cardiac:  normal S1, S2; RRR; no murmur  Lungs:   Breath sounds in bilateral bases.  Abd: soft, nontender, no hepatomegaly  Ext: no edema Musculoskeletal:  No deformities, BUE and BLE strength normal and equal Skin: warm and dry  Neuro:  CNs 2-12 intact, no focal abnormalities noted Psych:  Normal affect   EKG:  The EKG was personally reviewed and demonstrates: Normal sinus rhythm, no skin ST-T wave changes Telemetry:  Telemetry was personally reviewed and demonstrates: Not on telemetry in ED Hallway  Relevant CV Studies:  Echo 09/15/2019  1. Left ventricular ejection fraction, by estimation, is 30 to 35%. The  left ventricle has moderate to severely decreased function. The left  ventricle demonstrates regional wall motion abnormalities (see scoring  diagram/findings for description). Left  ventricular diastolic parameters are consistent with Grade I diastolic  dysfunction (impaired relaxation). There is akinesis of the left  ventricular, entire inferolateral wall.  2. Right ventricular systolic function is normal. The right ventricular  size is normal.   3. The mitral valve is grossly normal. Mild mitral valve regurgitation.   4. The aortic valve is tricuspid. Aortic valve regurgitation is mild to  moderate.   5. The inferior vena cava is normal in size with greater than 50%  respiratory variability, suggesting right atrial pressure of 3 mmHg.   Laboratory Data:  High Sensitivity Troponin:   Recent Labs  Lab 05/06/22 0358 05/06/22 0547  TROPONINIHS 55* 58*     Chemistry Recent Labs  Lab 05/06/22 0358  NA 141  K 4.1  CL 111  CO2 23  GLUCOSE 111*  BUN 24*   CREATININE 3.25*  CALCIUM 8.5*  GFRNONAA 20*  ANIONGAP 7    No results for input(s): "PROT", "ALBUMIN", "AST", "ALT", "ALKPHOS", "BILITOT" in the last 168 hours. Lipids No results for input(s): "CHOL", "TRIG", "HDL", "LABVLDL", "LDLCALC", "CHOLHDL" in the last 168 hours.  Hematology Recent Labs  Lab 05/06/22 0358  WBC 7.4  RBC 3.22*  HGB 9.5*  HCT 32.5*  MCV 100.9*  MCH 29.5  MCHC 29.2*  RDW 15.9*  PLT 113*   Thyroid  Recent Labs  Lab 05/06/22 0931  TSH 1.413    BNP Recent Labs  Lab 05/06/22 0358  BNP 1,699.1*    DDimer No results for input(s): "DDIMER" in the last 168 hours.   Radiology/Studies:  DG Chest 2 View  Result Date: 05/06/2022 CLINICAL DATA:  Shortness of breath, wheezing, and bloated sensation. EXAM: CHEST - 2 VIEW COMPARISON:  04/27/2022. FINDINGS: The heart is enlarged and the mediastinal contour is stable. There is atherosclerotic calcification of the aorta. The pulmonary vasculature is prominent. Interstitial prominence with scattered airspace opacities are noted in the lungs bilaterally. There are small bilateral pleural effusions. No pneumothorax. No acute osseous abnormality. IMPRESSION: 1. Cardiomegaly with pulmonary vascular congestion. 2. Interstitial and airspace opacities in the lungs bilaterally, possible edema or infiltrate, and improved from the prior exam. 3. Small bilateral pleural effusions. Electronically Signed   By: Brett Fairy M.D.   On: 05/06/2022 04:18     Assessment and Plan:   Mild acute on chronic combined systolic and diastolic heart failure  -Mild right neck JVD.  Diminished breath sounds in bilateral bases.  Orthopnea in the past few days.  He has no lower extremity edema.  I am not confident the patient is significantly volume overloaded, however he may be mildly volume overloaded based on his symptom.  Agree with initial 80 mg IV Lasix followed by 40 mg twice daily of IV Lasix.  Will need to monitor creatinine closely.   Obtain repeat echocardiogram  CAD: Previously had late presenting MI in January 2013, subsequent Myoview showed large area of infarction in the left circumflex territory.  Stage IV CKD: Baseline creatinine 3.1.  Followed by Dr. Theador Hawthorne   History of DVT in August 2016: On Eliquis  PAD: Denies significant leg pain.  Hypertension: Continue metoprolol succinate and Imdur  Hyperlipidemia: On Crestor  Paranoid schizophrenia: Recently admitted to [facility] in January   Risk Assessment/Risk Scores:        New York Heart Association (NYHA) Functional Class NYHA Class III        For questions or updates, please contact [place] Please consult [email or web address] for contact info under    Hilbert Corrigan, [place]  05/06/2022 2:27 PM

## 2022-05-06 NOTE — ED Provider Notes (Signed)
[facility] Provider Note   CSN: [id number] Arrival date & time: 05/06/22  0345     History  Chief Complaint  Patient presents with   Shortness of Breath    Glenn Hawkins is a 67 y.o. male.  Patient presents to the emergency department for evaluation of shortness of breath.  Patient reports that he was admitted to [facility] 1 week ago.  At that time he suffered hypoxic and hypercarbic respiratory failure, was diagnosed with multifocal pneumonia.  Patient reports tightness in his chest and difficulty breathing tonight.  No associated fever.       Home Medications Prior to Admission medications   Medication Sig Start Date End Date Taking? Authorizing Provider  amLODipine (NORVASC) 10 MG tablet Take 10 mg by mouth daily.    [provider]  apixaban (ELIQUIS) 2.5 MG TABS tablet Take 2.5 mg by mouth 2 (two) times daily.    [provider]  ARIPiprazole (ABILIFY) 20 MG tablet Take 20 mg by mouth daily.    [provider]  buPROPion ER (WELLBUTRIN SR) 100 MG 12 hr tablet Take 100 mg by mouth 2 (two) times daily. 08/20/20   [provider]  Cholecalciferol 50 MCG (2000 UT) TABS Take 2,000 Units by mouth daily.    [provider]  COLCRYS 0.6 MG tablet Take 0.6 mg by mouth 2 (two) times daily. 05/22/15   [provider]  esomeprazole (NEXIUM) 20 MG capsule Take 1 capsule (20 mg total) by mouth daily before breakfast. Patient taking differently: Take 20 mg by mouth daily as needed (acid reflux). 11/08/20   Erenest Rasher, PA-C  isosorbide mononitrate (IMDUR) 60 MG 24 hr tablet Take 1 tablet (60 mg total) by mouth every morning. Patient needs appointment for further refills. 1 st attempt 12/05/21   Wellington Hampshire, MD  lactulose (CHRONULAC) 10 GM/15ML solution Take 15 mLs (10 g total) by mouth 2 (two) times daily. Patient not taking: No sig reported 11/09/20   Erenest Rasher, PA-C   latanoprost (XALATAN) 0.005 % ophthalmic solution Place 1 drop into both eyes at bedtime. 10/11/20   [provider]  LORazepam (ATIVAN) 2 MG tablet Take 2 mg by mouth at bedtime. '2mg'$  at night    [provider]  metoprolol tartrate (LOPRESSOR) 100 MG tablet Take 100 mg by mouth 2 (two) times daily. 10/11/20   [provider]  nitroGLYCERIN (NITROSTAT) 0.4 MG SL tablet Place 1 tablet (0.4 mg total) under the tongue every 5 (five) minutes as needed for chest pain. Patient not taking: Reported on 12/11/2020 03/21/16   Wellington Hampshire, MD  QUEtiapine (SEROQUEL) 300 MG tablet Take 450 mg by mouth at bedtime. 10/06/17   [provider]  rosuvastatin (CRESTOR) 40 MG tablet Take 40 mg by mouth daily.    [provider]  sertraline (ZOLOFT) 100 MG tablet Take 200 mg by mouth daily.    [provider]  sodium bicarbonate 650 MG tablet Take 650 mg by mouth 2 (two) times daily.    [provider]      Allergies    Nifedipine, Rabeprazole, Benazepril, Haloperidol, and Omeprazole    Review of Systems   Review of Systems  Physical Exam Updated Vital Signs BP (!) 153/97   Pulse 81   Temp 97.9 F (36.6 C) (Oral)   Resp 20   SpO2 96%  Physical Exam Vitals and nursing note reviewed.  Constitutional:  General: He is not in acute distress.    Appearance: He is well-developed.  HENT:     Head: Normocephalic and atraumatic.     Mouth/Throat:     Mouth: Mucous membranes are moist.  Eyes:     General: Vision grossly intact. Gaze aligned appropriately.     Extraocular Movements: Extraocular movements intact.     Conjunctiva/sclera: Conjunctivae normal.  Cardiovascular:     Rate and Rhythm: Normal rate and regular rhythm.     Pulses: Normal pulses.     Heart sounds: S1 normal and S2 normal. No murmur heard.    No friction rub. Gallop present.  Pulmonary:     Effort: Pulmonary effort is normal. No respiratory distress.     Breath  sounds: Rales present.  Abdominal:     Palpations: Abdomen is soft.     Tenderness: There is no abdominal tenderness. There is no guarding or rebound.     Hernia: No hernia is present.  Musculoskeletal:        General: No swelling.     Cervical back: Full passive range of motion without pain, normal range of motion and neck supple. No pain with movement, spinous process tenderness or muscular tenderness. Normal range of motion.     Right lower leg: No edema.     Left lower leg: No edema.  Skin:    General: Skin is warm and dry.     Capillary Refill: Capillary refill takes less than 2 seconds.     Findings: No ecchymosis, erythema, lesion or wound.  Neurological:     Mental Status: He is alert and oriented to person, place, and time.     GCS: GCS eye subscore is 4. GCS verbal subscore is 5. GCS motor subscore is 6.     Cranial Nerves: Cranial nerves 2-12 are intact.     Sensory: Sensation is intact.     Motor: Motor function is intact. No weakness or abnormal muscle tone.     Coordination: Coordination is intact.  Psychiatric:        Mood and Affect: Mood normal.        Speech: Speech normal.        Behavior: Behavior normal.     ED Results / Procedures / Treatments   Labs (all labs ordered are listed, but only abnormal results are displayed) Labs Reviewed  BASIC METABOLIC PANEL - Abnormal; Notable for the following components:      Result Value   Glucose, Bld 111 (*)    BUN 24 (*)    Creatinine, Ser 3.25 (*)    Calcium 8.5 (*)    GFR, Estimated 20 (*)    All other components within normal limits  CBC - Abnormal; Notable for the following components:   RBC 3.22 (*)    Hemoglobin 9.5 (*)    HCT 32.5 (*)    MCV 100.9 (*)    MCHC 29.2 (*)    RDW 15.9 (*)    Platelets 113 (*)    All other components within normal limits  PROTIME-INR - Abnormal; Notable for the following components:   Prothrombin Time 16.6 (*)    INR 1.4 (*)    All other components within normal limits   BRAIN NATRIURETIC PEPTIDE - Abnormal; Notable for the following components:   B Natriuretic Peptide 1,699.1 (*)    All other components within normal limits  TROPONIN I (HIGH SENSITIVITY) - Abnormal; Notable for the following components:   Troponin I (High  Sensitivity) 55 (*)    All other components within normal limits  TROPONIN I (HIGH SENSITIVITY)    EKG EKG Interpretation  Date/Time:  Tuesday May 06 2022 03:47:49 EDT Ventricular Rate:  75 PR Interval:  134 QRS Duration: 116 QT Interval:  400 QTC Calculation: 446 R Axis:   31 Text Interpretation: Normal sinus rhythm Minimal voltage criteria for LVH, may be normal variant ( Cornell product ) Septal infarct , age undetermined Abnormal ECG When compared with ECG of 17-Oct-2014 15:13, No significant change was found Confirmed by Orpah Greek [phone]) on 05/06/2022 5:01:05 AM  Radiology DG Chest 2 View  Result Date: 05/06/2022 CLINICAL DATA:  Shortness of breath, wheezing, and bloated sensation. EXAM: CHEST - 2 VIEW COMPARISON:  04/27/2022. FINDINGS: The heart is enlarged and the mediastinal contour is stable. There is atherosclerotic calcification of the aorta. The pulmonary vasculature is prominent. Interstitial prominence with scattered airspace opacities are noted in the lungs bilaterally. There are small bilateral pleural effusions. No pneumothorax. No acute osseous abnormality. IMPRESSION: 1. Cardiomegaly with pulmonary vascular congestion. 2. Interstitial and airspace opacities in the lungs bilaterally, possible edema or infiltrate, and improved from the prior exam. 3. Small bilateral pleural effusions. Electronically Signed   By: Brett Fairy M.D.   On: 05/06/2022 04:18    Procedures Procedures    Medications Ordered in ED Medications  furosemide (LASIX) injection 80 mg (has no administration in time range)    ED Course/ Medical Decision Making/ A&P                             Medical Decision Making Amount  and/or Complexity of Data Reviewed Labs: ordered. Radiology: ordered.  Risk Prescription drug management.   Differential diagnosis considered includes, but not limited to: ACS; CHF exacerbation; pneumonia; PE  Patient presents to the emergency department for evaluation of shortness of breath.  Record review reveals that he was admitted to [facility] 1 week ago with a diagnosis of multifocal pneumonia.  He was on and off BiPAP during the hospital stay.  He does not use oxygen at home.  Patient was hypoxic at arrival, improved with supplemental oxygen by nasal cannula.  Patient has a known history of CHF.  He is not on diuretics other than spironolactone.  Patient's presentation today is consistent with pulmonary edema secondary to congestive heart failure.  He has a markedly elevated BNP.  Troponin elevated, consistent with leak secondary to heart failure.  Patient given IV Lasix and will be admitted.  CRITICAL CARE Performed by: Orpah Greek   Total critical care time: 30 minutes  Critical care time was exclusive of separately billable procedures and treating other patients.  Critical care was necessary to treat or prevent imminent or life-threatening deterioration.  Critical care was time spent personally by me on the following activities: development of treatment plan with patient and/or surrogate as well as nursing, discussions with consultants, evaluation of patient's response to treatment, examination of patient, obtaining history from patient or surrogate, ordering and performing treatments and interventions, ordering and review of laboratory studies, ordering and review of radiographic studies, pulse oximetry and re-evaluation of patient's condition.         Final Clinical Impression(s) / ED Diagnoses Final diagnoses:  Acute on chronic congestive heart failure, unspecified heart failure type ([place])    Rx / DC Orders ED Discharge Orders     None  Orpah Greek, MD 05/06/22 [phone]

## 2022-05-06 NOTE — ED Notes (Signed)
Place patient  continue to sat in the 90's placed on 3 liters, because pt is  c/o shortness of breath. Patient saturation is at 51

## 2022-05-06 NOTE — Progress Notes (Deleted)
Cardiology Office Note:    Date:  05/06/2022   ID:  Carrolyn Leigh, DOB 09-30-1955, MRN [id number]  PCP:  Glenda Chroman, MD  [facility] Cardiologist:  Kathlyn Sacramento, MD  [place] Electrophysiologist:  None   Referring MD: Glenda Chroman, MD   Chief Complaint: ***  History of Present Illness:    Glenn Hawkins is a 67 y.o. male with a hx of ***  Past Medical History:  Diagnosis Date   Aortic insufficiency    a. 08/2019 Echo: mild to mod AI.   C. difficile diarrhea 07/2020   CKD (chronic kidney disease) stage 3, GFR 30-59 ml/min (HCC)    baseline Cr around 2.5   Combined systolic and diastolic congestive heart failure ([place]) 05/06/2022   Coronary atherosclerosis of native coronary artery    a. 02/2011 Late presentation MI-->Myoview w/ large area of transmural infarct in LCX distribution, no ischemia.   Depressive disorder, not elsewhere classified    Esophageal reflux    Gout, unspecified    Hemorrhoids    a. 01/2016 s/p hemorrhoidectomy.   HFrEF (heart failure with reduced ejection fraction) ([place])    a. 04/2012 Echo: EF 40%, inflat AK, Gr1 DD, mild AI/MR, triv TR; b. 11/2017 EchoP EF 30-35%, inflat HK; c. 08/2019 Echo: EF 30-35%, gr1 DD, inflat AK.   History of DVT (deep vein thrombosis)    a. Chronic Eliquis.   Hyperlipidemia LDL goal <70    Hypertension    Hypotension, unspecified    Ischemic cardiomyopathy    a. 04/2012 Echo: EF 40%, inflat AK, Gr1 DD, mild AI/MR, triv TR; b. 11/2017 EchoP EF 30-35%, inflat HK; c. 08/2019 Echo: EF 30-35%, gr1 DD, inflat AK. Nl RV size/fxn. Mild MR. Mild to mod AI.   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 04/2012 Echo: EF 40%, mild MR.   Myocardial infarction (lateral wall) ([place]) 2013   a. 02/2011 - late presentation. Managed conservatively 2/2 CKD;  b. 02/2011 Myoview: Large transmural infarct in the LCX distribution, no ischemia.   PAD (peripheral artery disease) ([place])    a. 08/2015 ABI/duplex: R: 0.86, L 0.96. Duplex w/ bilateral  heterogeneous plaque in mid fem arteries, no significant stenoses; b. 08/2019 ABI/Duplex: prob bilat inflow dzs w/ stable ABIs (R 0.85, L 0.93).   Personal history of tobacco use, presenting hazards to health    Torn ligament    Unspecified schizophrenia, unspecified condition     Past Surgical History:  Procedure Laterality Date   BIOPSY  12/17/2020   Procedure: BIOPSY;  Surgeon: Eloise Harman, DO;  [facility];  Service: Endoscopy;;   COLONOSCOPY     COLONOSCOPY N/A 11/10/2014   Procedure: COLONOSCOPY;  Surgeon: Manus Gunning, MD;  Location: [facility];  Service: Gastroenterology;  Laterality: N/A;   COLONOSCOPY WITH PROPOFOL N/A 05/08/2015   Surgeon: [name], MD; nonthrombosed external hemorrhoids, one 8 mm tubular adenoma, one 4 mm tubular adenoma.  Repeat colonoscopy in 5-10 years.   COLONOSCOPY WITH PROPOFOL N/A 12/17/2020   Procedure: COLONOSCOPY WITH PROPOFOL;  Surgeon: Eloise Harman, DO;  [facility];  Service: Endoscopy;  Laterality: N/A;  8:30am   ESOPHAGOGASTRODUODENOSCOPY (EGD) WITH PROPOFOL N/A 05/08/2015   Surgeon: [name], MD; LA grade a reflux esophagitis, normal stomach and duodenum.   ESOPHAGOGASTRODUODENOSCOPY (EGD) WITH PROPOFOL N/A 12/17/2020   Procedure: ESOPHAGOGASTRODUODENOSCOPY (EGD) WITH PROPOFOL;  Surgeon: Eloise Harman, DO;  [facility];  Service: Endoscopy;  Laterality: N/A;   EYE SURGERY Right    removal of foreign body   HEMORRHOID SURGERY N/A 02/20/2016   Procedure: EXTENSIVE HEMORRHOIDECTOMY;  Surgeon: [name], MD;  Location: [facility];  Service: General;  Laterality: N/A;   None     POLYPECTOMY  05/08/2015   Procedure: POLYPECTOMY;  Surgeon: [name], MD;  [facility];  Service: Endoscopy;;  transverse colon polyp    Current Medications: No outpatient medications have been marked as taking for the 05/06/22 encounter (Appointment) with Kathlen Mody, Jaydalynn Olivero H, PA-C.      Allergies:   Nifedipine, Rabeprazole, Benazepril, Haloperidol, and Omeprazole   Social History   Socioeconomic History   Marital status: Divorced    Spouse name: Not on file   Number of children: Not on file   Years of education: Not on file   Highest education level: Not on file  Occupational History   Not on file  Tobacco Use   Smoking status: Former    Packs/day: 1.00    Years: 23.00    Total pack years: 23.00    Types: Cigarettes    Quit date: 02/25/1996    Years since quitting: 26.2   Smokeless tobacco: Never   Tobacco comments:    + 15 years of smoking  Vaping Use   Vaping Use: Never used  Substance and Sexual Activity   Alcohol use: No    Comment: Daily alcohol use 20+ years ago   Drug use: Not Currently    Types: Cocaine    Comment: History of intranasal cocaine and speed 20+ years ago.   Sexual activity: Not Currently    Birth control/protection: None  Other Topics Concern   Not on file  Social History Narrative   Not on file   Social Determinants of Health   Financial Resource Strain: Not on file  Food Insecurity: No Food Insecurity (11/26/2020)   Hunger Vital Sign    Worried About Running Out of Food in the Last Year: Never true    Ran Out of Food in the Last Year: Never true  Transportation Needs: Not on file  Physical Activity: Not on file  Stress: Not on file  Social Connections: Not on file     Family History: The patient's ***family history includes Crohn's disease in his sister. There is no history of Colon cancer.  ROS:   Please see the history of present illness.    *** All other systems reviewed and are negative.  EKGs/Labs/Other Studies Reviewed:    The following studies were reviewed today: ***  EKG:  EKG is *** ordered today.  The ekg ordered today demonstrates ***  Recent Labs: 05/06/2022: B Natriuretic Peptide 1,699.1; BUN 24; Creatinine, Ser 3.25; Hemoglobin 9.5; Platelets 113; Potassium 4.1; Sodium 141  Recent Lipid Panel     Component Value Date/Time   CHOL 191 08/02/2019 1515   TRIG 175 (H) 08/02/2019 1515   HDL 37 (L) 08/02/2019 1515   CHOLHDL 5.2 (H) 08/02/2019 1515   CHOLHDL 4.2 11/09/2014 0525   VLDL 23 11/09/2014 0525   LDLCALC 123 (H) 08/02/2019 1515   LDLDIRECT 125 (H) 08/02/2019 1515     Risk Assessment/Calculations:   {Does this patient have ATRIAL FIBRILLATION?:[phone]}   Physical Exam:    VS:  There were no vitals taken for this visit.    Wt Readings from Last 3 Encounters:  12/13/20 185 lb (83.9 kg)  11/29/20 184 lb 8 oz (83.7 kg)  11/08/20 166 lb 12.8  oz (75.7 kg)     GEN: *** Well nourished, well developed in no acute distress HEENT: Normal NECK: No JVD; No carotid bruits LYMPHATICS: No lymphadenopathy CARDIAC: ***RRR, no murmurs, rubs, gallops RESPIRATORY:  Clear to auscultation without rales, wheezing or rhonchi  ABDOMEN: Soft, non-tender, non-distended MUSCULOSKELETAL:  No edema; No deformity  SKIN: Warm and dry NEUROLOGIC:  Alert and oriented x 3 PSYCHIATRIC:  Normal affect   ASSESSMENT:    No diagnosis found. PLAN:    In order of problems listed above:  ***  Disposition: Follow up {follow up:15908} with ***   Shared Decision Making/Informed Consent   {Are you ordering a CV Procedure (e.g. stress test, cath, DCCV, TEE, etc)?   Press F2        :[id number]    Signed, Gavynn Duvall Arlyss Repress  05/06/2022 7:54 AM    [facility]

## 2022-05-06 NOTE — H&P (Signed)
History and Physical    Patient: Glenn Hawkins [id number] DOB: 20-Sep-1955 DOA: 05/06/2022 DOS: the patient was seen and examined on 05/06/2022 PCP: Glenda Chroman, MD  Patient coming from: Home  Chief Complaint:  Chief Complaint  Patient presents with   Shortness of Breath   HPI: Glenn Hawkins is a 67 y.o. male with medical history significant of  combined congestive heart failure, CAD, CKD stage IV, paranoid schizophrenia, and GERD who presents with complaints of shortness of breath.  History is obtained from the patient who is present at bedside with his assistance of his sister.  He had just recently been hospitalized 3/3-3/6 at [facility] for acute respiratory failure with hypoxia secondary to a community-acquired pneumonia.  Temporarily on BiPAP with improvement in symptoms avoiding intubation.  Patient has been treated with antibiotics doxycycline and Augmentin.  His heart failure was thought to be compensated at that time.  He was able to be weaned off of oxygen, but after getting discharged still reported feeling short of breath.  Patient describes it as a fullness feeling and reports symptoms are worse when trying to lay down at night.  He has continued to have a productive cough with thick white sputum production.  Associated symptom is include abdominal distention and reports of diarrhea that just recently started.  Denies having any fevers, nausea, vomiting, or significant leg swelling.  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  In the emergency department patient was noted to be afebrile with tachypnea, blood pressures elevated up to 171/101, and O2 saturations as low as 88% on room air with improvement on on 3 L nasal cannula oxygen.  Labs significant for hemoglobin 9.5, platelets 113, BUN 24, creatinine  3.25, BNP 1699.1, and troponin 55->58.  Chest x-ray noted cardiomegaly with pulmonary vascular congestion and concern for edema versus infiltrate with small bilateral pleural effusions.  Patient had been given Lasix 80 mg IV x 1 dose.  Review of Systems: As mentioned in the history of present illness. All other systems reviewed and are negative. Past Medical History:  Diagnosis Date   Aortic insufficiency    a. 08/2019 Echo: mild to mod AI.   C. difficile diarrhea 07/2020   CKD (chronic kidney disease) stage 3, GFR 30-59 ml/min (HCC)    baseline Cr around 2.5   Coronary atherosclerosis of native coronary artery    a. 02/2011 Late presentation MI-->Myoview w/ large area of transmural infarct in LCX distribution, no ischemia.   Depressive disorder, not elsewhere classified    Esophageal reflux    Gout, unspecified    Hemorrhoids    a. 01/2016 s/p hemorrhoidectomy.   HFrEF (heart failure with reduced ejection fraction) ([place])    a. 04/2012 Echo: EF 40%, inflat AK, Gr1 DD, mild AI/MR, triv TR; b. 11/2017 EchoP EF 30-35%, inflat HK; c. 08/2019 Echo: EF 30-35%, gr1 DD, inflat AK.   History of DVT (deep vein thrombosis)    a. Chronic Eliquis.   Hyperlipidemia LDL goal <70    Hypertension    Hypotension, unspecified    Ischemic cardiomyopathy    a. 04/2012 Echo: EF 40%, inflat AK, Gr1 DD, mild AI/MR, triv TR; b. 11/2017 EchoP EF 30-35%, inflat HK; c. 08/2019 Echo: EF 30-35%, gr1 DD, inflat AK. Nl RV size/fxn. Mild MR. Mild to mod  AI.   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 04/2012 Echo: EF 40%, mild MR.   Myocardial infarction (lateral wall) ([place]) 2013   a. 02/2011 - late presentation. Managed conservatively 2/2 CKD;  b. 02/2011 Myoview: Large transmural infarct in the LCX distribution, no ischemia.   PAD (peripheral artery disease) ([place])    a. 08/2015 ABI/duplex: R: 0.86, L 0.96. Duplex w/ bilateral heterogeneous plaque in mid fem arteries, no significant stenoses; b. 08/2019 ABI/Duplex: prob bilat inflow  dzs w/ stable ABIs (R 0.85, L 0.93).   Personal history of tobacco use, presenting hazards to health    Torn ligament    Unspecified schizophrenia, unspecified condition    Past Surgical History:  Procedure Laterality Date   BIOPSY  12/17/2020   Procedure: BIOPSY;  Surgeon: Eloise Harman, DO;  [facility];  Service: Endoscopy;;   COLONOSCOPY     COLONOSCOPY N/A 11/10/2014   Procedure: COLONOSCOPY;  Surgeon: Manus Gunning, MD;  Location: [facility];  Service: Gastroenterology;  Laterality: N/A;   COLONOSCOPY WITH PROPOFOL N/A 05/08/2015   Surgeon: [name], MD; nonthrombosed external hemorrhoids, one 8 mm tubular adenoma, one 4 mm tubular adenoma.  Repeat colonoscopy in 5-10 years.   COLONOSCOPY WITH PROPOFOL N/A 12/17/2020   Procedure: COLONOSCOPY WITH PROPOFOL;  Surgeon: Eloise Harman, DO;  [facility];  Service: Endoscopy;  Laterality: N/A;  8:30am   ESOPHAGOGASTRODUODENOSCOPY (EGD) WITH PROPOFOL N/A 05/08/2015   Surgeon: [name], MD; LA grade a reflux esophagitis, normal stomach and duodenum.   ESOPHAGOGASTRODUODENOSCOPY (EGD) WITH PROPOFOL N/A 12/17/2020   Procedure: ESOPHAGOGASTRODUODENOSCOPY (EGD) WITH PROPOFOL;  Surgeon: Eloise Harman, DO;  [facility];  Service: Endoscopy;  Laterality: N/A;   EYE SURGERY Right    removal of foreign body   HEMORRHOID SURGERY N/A 02/20/2016   Procedure: EXTENSIVE HEMORRHOIDECTOMY;  Surgeon: [name], MD;  Location: [facility];  Service: General;  Laterality: N/A;   None     POLYPECTOMY  05/08/2015   Procedure: POLYPECTOMY;  Surgeon: [name], MD;  [facility];  Service: Endoscopy;;  transverse colon polyp   Social History:  reports that he quit smoking about 26 years ago. His smoking use included cigarettes. He has a 23.00 pack-year smoking history. He has never used smokeless tobacco. He reports that he does not currently use drugs after having used the  following drugs: Cocaine. He reports that he does not drink alcohol.  Allergies  Allergen Reactions   Nifedipine Diarrhea   Rabeprazole Other (See Comments)    unknown   Benazepril Other (See Comments)    unknown   Haloperidol Anxiety    Causes severe anxiety   Omeprazole Other (See Comments)    unknown    Family History  Problem Relation Age of Onset   Crohn's disease Sister    Colon cancer Neg Hx     Prior to Admission medications   Medication Sig Start Date End Date Taking? Authorizing Provider  amLODipine (NORVASC) 10 MG tablet Take 10 mg by mouth daily.    [provider]  apixaban (ELIQUIS) 2.5 MG TABS tablet Take 2.5 mg by mouth 2 (two) times daily.    [provider]  ARIPiprazole (ABILIFY) 20 MG tablet Take 20 mg by mouth daily.    [provider]  buPROPion ER (WELLBUTRIN SR) 100 MG 12 hr tablet Take 100 mg by mouth 2 (two) times daily. 08/20/20   [provider]  Cholecalciferol 50 MCG (2000 UT) TABS Take 2,000 Units by mouth daily.    [provider]  COLCRYS 0.6 MG tablet Take 0.6 mg by mouth 2 (two) times daily. 05/22/15   [provider]  esomeprazole (NEXIUM) 20 MG capsule Take 1 capsule (20 mg total) by mouth daily before breakfast. Patient taking differently: Take 20 mg by mouth daily as needed (acid reflux). 11/08/20   Erenest Rasher, PA-C  isosorbide mononitrate (IMDUR) 60 MG 24 hr tablet Take 1 tablet (60 mg total) by mouth every morning. Patient needs appointment for further refills. 1 st attempt 12/05/21   Wellington Hampshire, MD  lactulose (CHRONULAC) 10 GM/15ML solution Take 15 mLs (10 g total) by mouth 2 (two) times daily. Patient not taking: No sig reported 11/09/20   Erenest Rasher, PA-C  latanoprost (XALATAN) 0.005 % ophthalmic solution Place 1 drop into both eyes at bedtime. 10/11/20   [provider]  LORazepam (ATIVAN) 2 MG tablet Take 2 mg by mouth at bedtime. '2mg'$  at night    [provider]  metoprolol tartrate (LOPRESSOR) 100 MG tablet Take 100 mg by mouth 2 (two) times daily. 10/11/20   [provider]  nitroGLYCERIN (NITROSTAT) 0.4 MG SL tablet Place 1 tablet (0.4 mg total) under the tongue every 5 (five) minutes as needed for chest pain. Patient not taking: Reported on 12/11/2020 03/21/16   Wellington Hampshire, MD  QUEtiapine (SEROQUEL) 300 MG tablet Take 450 mg by mouth at bedtime. 10/06/17   [provider]  rosuvastatin (CRESTOR) 40 MG tablet Take 40 mg by mouth daily.    [provider]  sertraline (ZOLOFT) 100 MG tablet Take 200 mg by mouth daily.    [provider]  sodium bicarbonate 650 MG tablet Take 650 mg by mouth 2 (two) times daily.    [provider]    Physical Exam: Vitals:   05/06/22 0353 05/06/22 0515 05/06/22 0530 05/06/22 0600  BP: (!) 171/101 (!) 153/97 (!) 148/93 (!) 150/91  Pulse: 89 81 75 71  Resp: (!) '22 20 17 17  '$ Temp: 97.9 F (36.6 C)     TempSrc: Oral     SpO2: (!) 88% 96% 95% 94%   Exam  Constitutional: Elderly male who is currently in no acute distress Eyes: PERRL, lids and conjunctivae normal ENMT: Mucous membranes are moist.  Fair dentition Neck: normal, supple.  JVD appreciated to the angle of the jaw. Respiratory: Normal respiratory effort with intermittent crackles appreciated.  Patient on 3 L of nasal cannula oxygen at this time. Cardiovascular: Regular rate and rhythm, no murmurs / rubs / gallops. No extremity edema.   Abdomen: Mild abdominal distention without tenderness to palpation.  Bowel sounds present all 4 quadrants. Musculoskeletal: no clubbing / cyanosis. No joint deformity upper and lower extremities. Good ROM, no contractures. Normal muscle tone.  Skin: no rashes, lesions, ulcers. No induration Neurologic: CN 2-12 grossly intact.   Strength 5/5 in all 4.  Psychiatric: Normal judgment and insight. Alert and oriented x 3. Normal mood.   Data Reviewed:  EKG  revealed normal sinus rhythm at 75 bpm without significant change from prior.  Reviewed labs, imaging, and pertinent records as noted above in HPI  Assessment and Plan:  Acute respiratory failure with hypoxia secondary to Combined systolic and diastolic congestive heart failure exacerbation Acute on chronic.  Initially O2 saturations noted to be as low as 88% on room air.  BNP elevated at 1699.1.  Chest  x-ray concerning for cardiomegaly with pulmonary vascular congestion and concern for interstitial edema with small bilateral pleural effusions.  Last available EF was noted to be 30 to 35% with grade 1 diastolic dysfunction back in 08/2019.  Patient has been given Lasix 80 mg IV x 1 dose.  He was just recently treated for concern for pneumonia at at [place] to a cardiac telemetry bed -Heart failure order set utilized -Continuous pulse oximetry with nasal cannula oxygen as needed to maintain O2 saturation greater 92% -Strict I&O's and Daily weights -Check TSH and procalcitonin -Check echocardiogram -Continue beta-blocker -Continue Lasix 40 mg IV twice daily -Cardiology consulted, will follow-up for any further recommendations  Elevated troponin Acute.  High-sensitivity troponin 55-> 58.  No significant ischemic changes noted on EKG.  Thought secondary to demand in setting of CHF exacerbation. -Continue to monitor  Essential hypertension Blood pressures initially elevated up to 171/101.  Home blood pressure regimen and years to include metoprolol succinate 100 mg daily and isosorbide mononitrate 20 mg twice daily. -Continue current blood pressure regimen  CKD stage IV Chronic.  Creatinine 3.25 which appears similar to prior.  Patient follows with Dr. Theador Hawthorne of [facility] in the outpatient settings in [place]. -Continue sodium bicarbonate tablets twice daily -Continue to monitor kidney function with diuresis  Iron deficiency anemia Chronic.  Hemoglobin  9.5 g/dL with MCV elevated 100.9.  Hemoglobin had been 8 g/dL on 3/6 on care everywhere.  He was also noted during that hospitalization to have total iron 35, TIBC 314, and iron saturation 11%.  His sister notes the plan was to start him on Epogen in the outpatient setting. -Iron infusion  Diarrhea Acute.  Patient has  been having several bowel movements over the last couple of days.  He had just recently been placed on antibiotics due to concern for possible pneumonia. -Monitor intake and output -Check C. difficile and GI panel  History of DVT on chronic anticoagulation -Continue Eliquis  Hyperlipidemia -Continue Crestor  Thrombocytopenia Chronic.  Platelet count 113 at this time, but appears to be similar to priors. -Continue to monitor  Paranoid schizophrenia Patient just recently had a long patient at the [facility] in February due to psychosis where her medication regimen had been changed. -Continue current regimen of sertraline, olanzapine, and risperidone  DVT prophylaxis:Eliquis Advance Care Planning:   Code Status: Full Code  Consults: Cardiology  Family Communication:   Severity of Illness: The appropriate patient status for this patient is INPATIENT. Inpatient status is judged to be reasonable and necessary in order to provide the required intensity of service to ensure the patient's safety. The patient's presenting symptoms, physical exam findings, and initial radiographic and laboratory data in the context of their chronic comorbidities is felt to place them at high risk for further clinical deterioration. Furthermore, it is not anticipated that the patient will be medically stable for discharge from the hospital within 2 midnights of admission.   * I certify that at the point of admission it is my clinical judgment that the patient will require inpatient hospital care spanning beyond 2 midnights from the point of admission due to high intensity of service, high risk for  further deterioration and high frequency of surveillance required.*  Author: Norval Morton, MD 05/06/2022 7:13 AM  For on call review [email or web address].

## 2022-05-06 NOTE — ED Triage Notes (Signed)
Patient reports SOB this evening with chest tightness , history of CAD/CHF . No emesis or diaphoresis . Denies fever or chills .

## 2022-05-07 ENCOUNTER — Inpatient Hospital Stay (HOSPITAL_COMMUNITY): Payer: Medicare Other

## 2022-05-07 DIAGNOSIS — I5021 Acute systolic (congestive) heart failure: Secondary | ICD-10-CM

## 2022-05-07 DIAGNOSIS — I5043 Acute on chronic combined systolic (congestive) and diastolic (congestive) heart failure: Secondary | ICD-10-CM | POA: Diagnosis not present

## 2022-05-07 LAB — ECHOCARDIOGRAM COMPLETE
Area-P 1/2: 3.34 cm2
Calc EF: 34.1 %
Height: 68 in
MV M vel: 5.95 m/s
MV Peak grad: 141.4 mmHg
Radius: 0.55 cm
S' Lateral: 5.5 cm
Single Plane A2C EF: 36.5 %
Single Plane A4C EF: 36.1 %
Weight: 2828.94 oz

## 2022-05-07 LAB — COMPREHENSIVE METABOLIC PANEL
ALT: 19 U/L (ref 0–44)
AST: 20 U/L (ref 15–41)
Albumin: 2.9 g/dL — ABNORMAL LOW (ref 3.5–5.0)
Alkaline Phosphatase: 75 U/L (ref 38–126)
Anion gap: 9 (ref 5–15)
BUN: 34 mg/dL — ABNORMAL HIGH (ref 8–23)
CO2: 24 mmol/L (ref 22–32)
Calcium: 8.3 mg/dL — ABNORMAL LOW (ref 8.9–10.3)
Chloride: 106 mmol/L (ref 98–111)
Creatinine, Ser: 3.44 mg/dL — ABNORMAL HIGH (ref 0.61–1.24)
GFR, Estimated: 19 mL/min — ABNORMAL LOW (ref >60)
Glucose, Bld: 124 mg/dL — ABNORMAL HIGH (ref 70–99)
Potassium: 4.1 mmol/L (ref 3.5–5.1)
Sodium: 139 mmol/L (ref 135–145)
Total Bilirubin: 0.7 mg/dL (ref 0.3–1.2)
Total Protein: 5.3 g/dL — ABNORMAL LOW (ref 6.5–8.1)

## 2022-05-07 LAB — CBC
HCT: 27.4 % — ABNORMAL LOW (ref 39.0–52.0)
Hemoglobin: 7.9 g/dL — ABNORMAL LOW (ref 13.0–17.0)
MCH: 28.4 pg (ref 26.0–34.0)
MCHC: 28.8 g/dL — ABNORMAL LOW (ref 30.0–36.0)
MCV: 98.6 fL (ref 80.0–100.0)
Platelets: 88 10*3/uL — ABNORMAL LOW (ref 150–400)
RBC: 2.78 MIL/uL — ABNORMAL LOW (ref 4.22–5.81)
RDW: 15.7 % — ABNORMAL HIGH (ref 11.5–15.5)
WBC: 5.9 10*3/uL (ref 4.0–10.5)
nRBC: 0 % (ref 0.0–0.2)

## 2022-05-07 LAB — MRSA NEXT GEN BY PCR, NASAL: MRSA by PCR Next Gen: NOT DETECTED

## 2022-05-07 MED ORDER — COLCHICINE 0.3 MG HALF TABLET
0.3000 mg | ORAL_TABLET | Freq: Once | ORAL | Status: AC
  Administered 2022-05-07: 0.3 mg via ORAL
  Filled 2022-05-07: qty 1

## 2022-05-07 MED ORDER — SODIUM CHLORIDE 0.9 % IV SOLN: 510.0000 mg | Freq: Once | INTRAVENOUS | Status: DC

## 2022-05-07 MED ORDER — COLCHICINE 0.6 MG PO TABS: 0.6000 mg | ORAL_TABLET | Freq: Every day | ORAL | Status: DC

## 2022-05-07 MED ORDER — FUROSEMIDE 40 MG PO TABS: 40.0000 mg | ORAL_TABLET | Freq: Every day | ORAL | Status: DC

## 2022-05-07 MED ORDER — FUROSEMIDE 40 MG PO TABS
40.0000 mg | ORAL_TABLET | Freq: Every day | ORAL | Status: DC
  Administered 2022-05-08 – 2022-05-09 (×2): 40 mg via ORAL
  Filled 2022-05-07 (×2): qty 1

## 2022-05-07 NOTE — Progress Notes (Signed)
DAILY PROGRESS NOTE   Patient Name: Glenn Hawkins Date of Encounter: 05/07/2022 Cardiologist: Kathlyn Sacramento, MD  Chief Complaint   Breathing better  Patient Profile   Glenn Hawkins is a 67 y.o. male with a hx of CAD, stage IV CKD, history of DVT August 2016, PAD, chronic combined systolic and diastolic heart failure, paranoid schizophrenia, hypertension and hyperlipidemia who is being seen 05/06/2022 for the evaluation of dyspnea at the request of Dr. Tamala Julian.   Subjective   Diuresed about 1.8L negative overnight. Small increase in creatinine to 3.44 from 3.25 (minimal change in GFR). BUN jumped from 24 to 34.   Objective   Vitals:   05/07/22 0300 05/07/22 0400 05/07/22 0516 05/07/22 0758  BP: 138/82 131/76 (!) 149/92 (!) 144/82  Pulse: 64 61 70 74  Resp: '12 15 14 15  '$ Temp:   97.9 F (36.6 C) 97.6 F (36.4 C)  TempSrc:   Oral Oral  SpO2: 100% 96% 100% 98%  Weight:   80.2 kg   Height:   '5\' 8"'$  (1.727 m)     Intake/Output Summary (Last 24 hours) at 05/07/2022 0816 Last data filed at 05/07/2022 [id number] Gross per 24 hour  Intake 480 ml  Output 2100 ml  Net -1620 ml   Filed Weights   05/07/22 0516  Weight: 80.2 kg    Physical Exam   General appearance: alert and no distress Neck: JVD - 2 cm above sternal notch, no carotid bruit, and thyroid not enlarged, symmetric, no tenderness/mass/nodules Lungs: diminished breath sounds bibasilar Heart: regular rate and rhythm Abdomen: soft, non-tender; bowel sounds normal; no masses,  no organomegaly Extremities: extremities normal, atraumatic, no cyanosis or edema Pulses: 2+ and symmetric Skin: Skin color, texture, turgor normal. No rashes or lesions Neurologic: Grossly normal Psych: Pleasant  Inpatient Medications    Scheduled Meds:  apixaban  2.5 mg Oral BID   furosemide  40 mg Intravenous BID   guaiFENesin  600 mg Oral BID   isosorbide mononitrate  20 mg Oral BID   metoprolol succinate  100 mg Oral Daily    OLANZapine  15 mg Oral QHS   OLANZapine  5 mg Oral Daily   risperiDONE  1 mg Oral QHS   rosuvastatin  20 mg Oral Daily   saccharomyces boulardii  250 mg Oral BID   sertraline  100 mg Oral Daily   sodium bicarbonate  650 mg Oral BID   sodium chloride flush  3 mL Intravenous Q12H    Continuous Infusions:  ferric gluconate (FERRLECIT) IVPB Stopped (05/06/22 1745)    PRN Meds: acetaminophen **OR** acetaminophen, albuterol   Labs   Results for orders placed or performed during the hospital encounter of 05/06/22 (from the past 48 hour(s))  Basic metabolic panel     Status: Abnormal   Collection Time: 05/06/22  3:58 AM  Result Value Ref Range   Sodium 141 135 - 145 mmol/L   Potassium 4.1 3.5 - 5.1 mmol/L   Chloride 111 98 - 111 mmol/L   CO2 23 22 - 32 mmol/L   Glucose, Bld 111 (H) 70 - 99 mg/dL    Comment: Glucose reference range applies only to samples taken after fasting for at least 8 hours.   BUN 24 (H) 8 - 23 mg/dL   Creatinine, Ser 3.25 (H) 0.61 - 1.24 mg/dL   Calcium 8.5 (L) 8.9 - 10.3 mg/dL   GFR, Estimated 20 (L) >60 mL/min    Comment: (NOTE) Calculated using the  CKD-EPI Creatinine Equation (2021)    Anion gap 7 5 - 15    Comment: Performed at [facility], [place] [address] [place], [place] [address]  CBC     Status: Abnormal   Collection Time: 05/06/22  3:58 AM  Result Value Ref Range   WBC 7.4 4.0 - 10.5 K/uL   RBC 3.22 (L) 4.22 - 5.81 MIL/uL   Hemoglobin 9.5 (L) 13.0 - 17.0 g/dL   HCT 32.5 (L) 39.0 - 52.0 %   MCV 100.9 (H) 80.0 - 100.0 fL   MCH 29.5 26.0 - 34.0 pg   MCHC 29.2 (L) 30.0 - 36.0 g/dL   RDW 15.9 (H) 11.5 - 15.5 %   Platelets 113 (L) 150 - 400 K/uL   nRBC 0.0 0.0 - 0.2 %    Comment: Performed at [facility], [place] [address], [place], [place] [address]  Troponin I (High Sensitivity)     Status: Abnormal   Collection Time: 05/06/22  3:58 AM  Result Value Ref Range   Troponin I (High Sensitivity) 55 (H) <18 ng/L    Comment:  (NOTE) Elevated high sensitivity troponin I (hsTnI) values and significant  changes across serial measurements may suggest ACS but many other  chronic and acute conditions are known to elevate hsTnI results.  Refer to the "Links" section for chest pain algorithms and additional  guidance. Performed at [facility], [place] [address] [place], [place] [address]   Protime-INR (order if Patient is taking Coumadin / Warfarin)     Status: Abnormal   Collection Time: 05/06/22  3:58 AM  Result Value Ref Range   Prothrombin Time 16.6 (H) 11.4 - 15.2 seconds   INR 1.4 (H) 0.8 - 1.2    Comment: (NOTE) INR goal varies based on device and disease states. Performed at [facility], [place] [address] [place], [place] [address]   Brain natriuretic peptide     Status: Abnormal   Collection Time: 05/06/22  3:58 AM  Result Value Ref Range   B Natriuretic Peptide 1,699.1 (H) 0.0 - 100.0 pg/mL    Comment: Performed at [facility] [address] [place], [place] [address]  Troponin I (High Sensitivity)     Status: Abnormal   Collection Time: 05/06/22  5:47 AM  Result Value Ref Range   Troponin I (High Sensitivity) 58 (H) <18 ng/L    Comment: (NOTE) Elevated high sensitivity troponin I (hsTnI) values and significant  changes across serial measurements may suggest ACS but many other  chronic and acute conditions are known to elevate hsTnI results.  Refer to the "Links" section for chest pain algorithms and additional  guidance. Performed at [facility], [place] [address], [place], [place] [address]   HIV Antibody (routine testing w rflx)     Status: None   Collection Time: 05/06/22  9:31 AM  Result Value Ref Range   HIV Screen 4th Generation wRfx Non Reactive Non Reactive    Comment: Performed at [facility], [place] [address], [place], [place] [address]  Procalcitonin     Status: None   Collection Time: 05/06/22  9:31 AM  Result Value Ref Range   Procalcitonin 0.47  ng/mL    Comment:        Interpretation: PCT (Procalcitonin) <= 0.5 ng/mL: Systemic infection (sepsis) is not likely. Local bacterial infection is possible. (NOTE)       Sepsis PCT Algorithm  Lower Respiratory Tract                                      Infection PCT Algorithm    ----------------------------     ----------------------------         PCT < 0.25 ng/mL                PCT < 0.10 ng/mL          Strongly encourage             Strongly discourage   discontinuation of antibiotics    initiation of antibiotics    ----------------------------     -----------------------------       PCT 0.25 - 0.50 ng/mL            PCT 0.10 - 0.25 ng/mL               OR       >80% decrease in PCT            Discourage initiation of                                            antibiotics      Encourage discontinuation           of antibiotics    ----------------------------     -----------------------------         PCT >= 0.50 ng/mL              PCT 0.26 - 0.50 ng/mL               AND        <80% decrease in PCT             Encourage initiation of                                             antibiotics       Encourage continuation           of antibiotics    ----------------------------     -----------------------------        PCT >= 0.50 ng/mL                  PCT > 0.50 ng/mL               AND         increase in PCT                  Strongly encourage                                      initiation of antibiotics    Strongly encourage escalation           of antibiotics                                     -----------------------------  PCT <= 0.25 ng/mL                                                 OR                                        > 80% decrease in PCT                                      Discontinue / Do not initiate                                             antibiotics  Performed at [facility], [place] [address] [place], [place] [address]   TSH     Status: None   Collection Time: 05/06/22  9:31 AM  Result Value Ref Range   TSH 1.413 0.350 - 4.500 uIU/mL    Comment: Performed by a 3rd Generation assay with a functional sensitivity of <=0.01 uIU/mL. Performed at [facility], [place] [address], [place], [place] [address]   Urinalysis, Routine w reflex microscopic -Urine, Clean Catch     Status: Abnormal   Collection Time: 05/06/22  2:42 PM  Result Value Ref Range   Color, Urine COLORLESS (A) YELLOW   APPearance CLEAR CLEAR   Specific Gravity, Urine 1.005 1.005 - 1.030   pH 5.0 5.0 - 8.0   Glucose, UA NEGATIVE NEGATIVE mg/dL   Hgb urine dipstick NEGATIVE NEGATIVE   Bilirubin Urine NEGATIVE NEGATIVE   Ketones, ur NEGATIVE NEGATIVE mg/dL   Protein, ur 100 (A) NEGATIVE mg/dL   Nitrite NEGATIVE NEGATIVE   Leukocytes,Ua NEGATIVE NEGATIVE   RBC / HPF 0-5 0 - 5 RBC/hpf   WBC, UA 0-5 0 - 5 WBC/hpf   Bacteria, UA NONE SEEN NONE SEEN   Squamous Epithelial / HPF 0-5 0 - 5 /HPF    Comment: Performed at [facility], [place] [address], [place], [place] [address]  CBC     Status: Abnormal   Collection Time: 05/07/22  1:11 AM  Result Value Ref Range   WBC 5.9 4.0 - 10.5 K/uL   RBC 2.78 (L) 4.22 - 5.81 MIL/uL   Hemoglobin 7.9 (L) 13.0 - 17.0 g/dL   HCT 27.4 (L) 39.0 - 52.0 %   MCV 98.6 80.0 - 100.0 fL   MCH 28.4 26.0 - 34.0 pg   MCHC 28.8 (L) 30.0 - 36.0 g/dL   RDW 15.7 (H) 11.5 - 15.5 %   Platelets 88 (L) 150 - 400 K/uL    Comment: Immature Platelet Fraction may be clinically indicated, consider ordering this additional test [id number] REPEATED TO VERIFY    nRBC 0.0 0.0 - 0.2 %    Comment: Performed at [facility], [place] [address], [place], [place] [address]  Comprehensive metabolic panel     Status: Abnormal   Collection Time: 05/07/22  1:11 AM  Result Value Ref Range   Sodium 139 135 - 145 mmol/L   Potassium 4.1 3.5 - 5.1 mmol/L  Chloride 106 98 - 111 mmol/L   CO2 24 22 - 32  mmol/L   Glucose, Bld 124 (H) 70 - 99 mg/dL    Comment: Glucose reference range applies only to samples taken after fasting for at least 8 hours.   BUN 34 (H) 8 - 23 mg/dL   Creatinine, Ser 3.44 (H) 0.61 - 1.24 mg/dL   Calcium 8.3 (L) 8.9 - 10.3 mg/dL   Total Protein 5.3 (L) 6.5 - 8.1 g/dL   Albumin 2.9 (L) 3.5 - 5.0 g/dL   AST 20 15 - 41 U/L   ALT 19 0 - 44 U/L   Alkaline Phosphatase 75 38 - 126 U/L   Total Bilirubin 0.7 0.3 - 1.2 mg/dL   GFR, Estimated 19 (L) >60 mL/min    Comment: (NOTE) Calculated using the CKD-EPI Creatinine Equation (2021)    Anion gap 9 5 - 15    Comment: Performed at [facility], [place] [address] [place], [place] [address]  MRSA Next Gen by PCR, Nasal     Status: None   Collection Time: 05/07/22  5:34 AM   Specimen: Nasal Mucosa; Nasal Swab  Result Value Ref Range   MRSA by PCR Next Gen NOT DETECTED NOT DETECTED    Comment: (NOTE) The GeneXpert MRSA Assay (FDA approved for NASAL specimens only), is one component of a comprehensive MRSA colonization surveillance program. It is not intended to diagnose MRSA infection nor to guide or monitor treatment for MRSA infections. Test performance is not FDA approved in patients less than 40 years old. Performed at [facility], [place] [address] [place], [place] [address]     ECG   N/A - Personally Reviewed  Telemetry   Sinus rhythm - Personally Reviewed  Radiology    DG Chest 2 View  Result Date: 05/06/2022 CLINICAL DATA:  Shortness of breath, wheezing, and bloated sensation. EXAM: CHEST - 2 VIEW COMPARISON:  04/27/2022. FINDINGS: The heart is enlarged and the mediastinal contour is stable. There is atherosclerotic calcification of the aorta. The pulmonary vasculature is prominent. Interstitial prominence with scattered airspace opacities are noted in the lungs bilaterally. There are small bilateral pleural effusions. No pneumothorax. No acute osseous abnormality. IMPRESSION: 1. Cardiomegaly with  pulmonary vascular congestion. 2. Interstitial and airspace opacities in the lungs bilaterally, possible edema or infiltrate, and improved from the prior exam. 3. Small bilateral pleural effusions. Electronically Signed   By: Brett Fairy M.D.   On: 05/06/2022 04:18    Cardiac Studies   Echo pending  Assessment   Principal Problem:   Combined systolic and diastolic congestive heart failure (HCC) Active Problems:   Hypertension   Hyperlipidemia   Diarrhea   Acute respiratory failure with hypoxia (HCC)   Elevated troponin   Iron deficiency anemia   History of DVT (deep vein thrombosis)   Chronic anticoagulation   Thrombocytopenia (HCC)   Paranoid schizophrenia (HCC)   Plan   Good diuresis overnight- small increase in creatinine but BUN rising - will hold IV lasix today, transition to higher dose PO lasix tomorrow. Echo pending today. He appears near-euvolemic. Would benefit from pulmonary function testing when he is euvolemic and can make maximal effort as I suspect significant underlying COPD/emphysema contributing to his dyspnea.  Time Spent Directly with Patient:  I have spent a total of 25 minutes with the patient reviewing hospital notes, telemetry, EKGs, labs and examining the patient as well as establishing an assessment and plan that was discussed personally  with the patient.  > 50% of time was spent in direct patient care.  Length of Stay:  LOS: 1 day   Pixie Casino, MD, [facility], [place] Director of the Advanced Lipid Disorders &  Cardiovascular Risk Reduction Clinic Diplomate of the American Board of Clinical Lipidology Attending Cardiologist  Direct Dial: [phone]  Fax: [phone]  Website:  [email or web address] Prewitt 05/07/2022, 8:16 AM

## 2022-05-07 NOTE — Progress Notes (Signed)
Echocardiogram 2D Echocardiogram has been performed.  Oneal Deputy Kattleya Kuhnert RDCS 05/07/2022, 10:25 AM

## 2022-05-07 NOTE — Plan of Care (Signed)
  Problem: Education: Goal: Ability to demonstrate management of disease process will improve Outcome: Progressing Goal: Ability to verbalize understanding of medication therapies will improve Outcome: Progressing   Problem: Activity: Goal: Capacity to carry out activities will improve Outcome: Progressing   Problem: Education: Goal: Knowledge of General Education information will improve Description: Including pain rating scale, medication(s)/side effects and non-pharmacologic comfort measures Outcome: Progressing   Problem: Health Behavior/Discharge Planning: Goal: Ability to manage health-related needs will improve Outcome: Progressing   Problem: Clinical Measurements: Goal: Ability to maintain clinical measurements within normal limits will improve Outcome: Progressing Goal: Will remain free from infection Outcome: Progressing   Problem: Nutrition: Goal: Adequate nutrition will be maintained Outcome: Progressing   Problem: Pain Managment: Goal: General experience of comfort will improve Outcome: Progressing   Problem: Safety: Goal: Ability to remain free from injury will improve Outcome: Progressing   Problem: Skin Integrity: Goal: Risk for impaired skin integrity will decrease Outcome: Progressing

## 2022-05-07 NOTE — Progress Notes (Addendum)
PROGRESS NOTE    CHESKY RESPRESS  [id number] DOB: 11-18-1955 DOA: 05/06/2022 PCP: Glenda Chroman, MD  67/male with chronic combined CHF, CAD, CKD 4, paranoid schizophrenia and GERD presented to the ED with shortness of breath.  Recently hospitalized 3/3-3/6 at [facility] with hypoxia from pneumonia, still reported being somewhat short of breath at discharge. -In the ED hypertensive, mildly hypoxic 88% on room air, labs noted hemoglobin of 9.5, creatinine of 3.2, troponin 55, 58, chest x-ray noted cardiomegaly pulmonary vascular congestion and concern for edema versus infiltrate, small bilateral pleural effusions   Subjective: -Feels better, breathing starting to improve  Assessment and Plan:  Acute respiratory failure with hypoxia Acute on chronic combined systolic and diastolic CHF -Last echo [id number] with EF of [id number] grade 1 diastolic dysfunction -Diuresed with IV Lasix yesterday on admission, 1.8 L negative, volume status is improved, cards following, transition to oral Lasix -Follow-up echocardiogram -Continue low-dose beta-blocker, isosorbide mononitrate -GDMT limited by CKD, BMP in a.m., wean O2   Elevated troponin Acute.  High-sensitivity troponin 55-> 58.  No ACS, this is demand ischemia   Essential hypertension -Stable, meds as above   CKD stage IV Chronic.  Creatinine 3.25 which appears similar to prior.  Patient follows with Dr. Theador Hawthorne of [facility] -Continue sodium bicarbonate -Monitor, BMP in a.m.   Iron deficiency anemia Chronic.  Hemoglobin 9.5 g/dL with MCV elevated 100.9.  Hemoglobin had been 8 g/dL on 3/6 on care everywhere.  He was also noted during that hospitalization to have total iron 35, TIBC 314, and iron saturation 11%.  Per sister plan was to start him on Epogen in the outpatient setting. -Give IV iron   Diarrhea Acute.  Patient has  been having several bowel movements over the last couple of days.  He had just recently been  placed on antibiotics due to concern for possible pneumonia. -Appears to be improving -FU C. difficile and GI panel   History of DVT on chronic anticoagulation -Continue Eliquis   Hyperlipidemia -Continue Crestor   Thrombocytopenia Chronic.  Platelet count 113 at this time, but appears to be similar to priors. -Continue to monitor   Paranoid schizophrenia Patient just recently had a long patient at the [facility] in February due to psychosis where her medication regimen had been changed. -Continue current regimen of sertraline, olanzapine, and risperidone   DVT prophylaxis:Eliquis Advance Care Planning:   Code Status: Full Code  Consults: Cardiology Disposition Plan: Home in 1 to 2 days   Procedures:   Antimicrobials:    Objective: Vitals:   05/07/22 0400 05/07/22 0516 05/07/22 0758 05/07/22 0927  BP: 131/76 (!) 149/92 (!) 144/82 (!) 154/89  Pulse: 61 70 74 78  Resp: '15 14 15   '$ Temp:  97.9 F (36.6 C) 97.6 F (36.4 C)   TempSrc:  Oral Oral   SpO2: 96% 100% 98%   Weight:  80.2 kg    Height:  '5\' 8"'$  (1.727 m)      Intake/Output Summary (Last 24 hours) at 05/07/2022 1035 Last data filed at 05/07/2022 0759 Gross per 24 hour  Intake 480 ml  Output 1500 ml  Net -1020 ml   Filed Weights   05/07/22 0516  Weight: 80.2 kg    Examination:  General exam: Appears calm and comfortable  Respiratory system: Decreased breath sounds at the bases Cardiovascular system: S1 & S2 heard, RRR.  Abd: nondistended, soft and nontender.Normal bowel sounds heard. Central nervous system: Alert and oriented. No focal  neurological deficits. Extremities: Trace edema Skin: No rashes Psychiatry:  Mood & affect appropriate.     Data Reviewed:   CBC: Recent Labs  Lab 05/06/22 0358 05/07/22 0111  WBC 7.4 5.9  HGB 9.5* 7.9*  HCT 32.5* 27.4*  MCV 100.9* 98.6  PLT 113* 88*   Basic Metabolic Panel: Recent Labs  Lab 05/06/22 0358 05/07/22 0111  NA 141 139  K 4.1 4.1  CL  111 106  CO2 23 24  GLUCOSE 111* 124*  BUN 24* 34*  CREATININE 3.25* 3.44*  CALCIUM 8.5* 8.3*   GFR: Estimated Creatinine Clearance: 20.7 mL/min (A) (by C-G formula based on SCr of 3.44 mg/dL (H)). Liver Function Tests: Recent Labs  Lab 05/07/22 0111  AST 20  ALT 19  ALKPHOS 75  BILITOT 0.7  PROT 5.3*  ALBUMIN 2.9*   No results for input(s): "LIPASE", "AMYLASE" in the last 168 hours. No results for input(s): "AMMONIA" in the last 168 hours. Coagulation Profile: Recent Labs  Lab 05/06/22 0358  INR 1.4*   Cardiac Enzymes: No results for input(s): "CKTOTAL", "CKMB", "CKMBINDEX", "TROPONINI" in the last 168 hours. BNP (last 3 results) No results for input(s): "PROBNP" in the last 8760 hours. HbA1C: No results for input(s): "HGBA1C" in the last 72 hours. CBG: No results for input(s): "GLUCAP" in the last 168 hours. Lipid Profile: No results for input(s): "CHOL", "HDL", "LDLCALC", "TRIG", "CHOLHDL", "LDLDIRECT" in the last 72 hours. Thyroid Function Tests: Recent Labs    05/06/22 0931  TSH 1.413   Anemia Panel: No results for input(s): "VITAMINB12", "FOLATE", "FERRITIN", "TIBC", "IRON", "RETICCTPCT" in the last 72 hours. Urine analysis:    Component Value Date/Time   COLORURINE COLORLESS (A) 05/06/2022 1442   APPEARANCEUR CLEAR 05/06/2022 1442   LABSPEC 1.005 05/06/2022 1442   PHURINE 5.0 05/06/2022 1442   GLUCOSEU NEGATIVE 05/06/2022 1442   HGBUR NEGATIVE 05/06/2022 1442   BILIRUBINUR NEGATIVE 05/06/2022 1442   KETONESUR NEGATIVE 05/06/2022 1442   PROTEINUR 100 (A) 05/06/2022 1442   UROBILINOGEN 0.2 10/17/2014 2008   NITRITE NEGATIVE 05/06/2022 1442   LEUKOCYTESUR NEGATIVE 05/06/2022 1442   Sepsis Labs: '@LABRCNTIP'$ (procalcitonin:4,lacticidven:4)  ) Recent Results (from the past 240 hour(s))  MRSA Next Gen by PCR, Nasal     Status: None   Collection Time: 05/07/22  5:34 AM   Specimen: Nasal Mucosa; Nasal Swab  Result Value Ref Range Status   MRSA by PCR  Next Gen NOT DETECTED NOT DETECTED Final    Comment: (NOTE) The GeneXpert MRSA Assay (FDA approved for NASAL specimens only), is one component of a comprehensive MRSA colonization surveillance program. It is not intended to diagnose MRSA infection nor to guide or monitor treatment for MRSA infections. Test performance is not FDA approved in patients less than 38 years old. Performed at [facility], [place] [address], [place], [place] [address]      Radiology Studies: DG Chest 2 View  Result Date: 05/06/2022 CLINICAL DATA:  Shortness of breath, wheezing, and bloated sensation. EXAM: CHEST - 2 VIEW COMPARISON:  04/27/2022. FINDINGS: The heart is enlarged and the mediastinal contour is stable. There is atherosclerotic calcification of the aorta. The pulmonary vasculature is prominent. Interstitial prominence with scattered airspace opacities are noted in the lungs bilaterally. There are small bilateral pleural effusions. No pneumothorax. No acute osseous abnormality. IMPRESSION: 1. Cardiomegaly with pulmonary vascular congestion. 2. Interstitial and airspace opacities in the lungs bilaterally, possible edema or infiltrate, and improved from the prior exam. 3. Small bilateral pleural  effusions. Electronically Signed   By: Brett Fairy M.D.   On: 05/06/2022 04:18     Scheduled Meds:  apixaban  2.5 mg Oral BID   colchicine  0.6 mg Oral Daily   [START ON 05/08/2022] furosemide  40 mg Oral Daily   guaiFENesin  600 mg Oral BID   isosorbide mononitrate  20 mg Oral BID   metoprolol succinate  100 mg Oral Daily   OLANZapine  15 mg Oral QHS   OLANZapine  5 mg Oral Daily   risperiDONE  1 mg Oral QHS   rosuvastatin  20 mg Oral Daily   saccharomyces boulardii  250 mg Oral BID   sertraline  100 mg Oral Daily   sodium bicarbonate  650 mg Oral BID   sodium chloride flush  3 mL Intravenous Q12H   Continuous Infusions:  ferric gluconate (FERRLECIT) IVPB Stopped (05/06/22 1745)     LOS: 1 day     Time spent: 34mn    PDomenic Polite MD [facility]   05/07/2022, 10:35 AM

## 2022-05-07 NOTE — ED Notes (Signed)
ED TO INPATIENT HANDOFF REPORT  ED Nurse Name and Phone #: Gregary Signs [phone]  S Name/Age/Gender Glenn Hawkins 67 y.o. male Room/Bed: 037C/037C  Code Status   Code Status: Full Code  Home/SNF/Other Home Patient oriented to: self Is this baseline? Yes   Triage Complete: Triage complete  Chief Complaint Combined systolic and diastolic congestive heart failure ([place]) [I50.40]  Triage Note Patient reports SOB this evening with chest tightness , history of CAD/CHF . No emesis or diaphoresis . Denies fever or chills .    Allergies Allergies  Allergen Reactions   Nifedipine Diarrhea   Rabeprazole Other (See Comments)    unknown   Benazepril Other (See Comments)    unknown   Haloperidol Anxiety    Causes severe anxiety   Omeprazole Other (See Comments)    unknown    Level of Care/Admitting Diagnosis ED Disposition     ED Disposition  Admit   Condition  --   [facility]: [place] [[id number]]  Level of Care: Telemetry Cardiac [[id number]]  May admit patient to [facility] or [facility] if equivalent level of care is available:: No  Covid Evaluation: Asymptomatic - no recent exposure (last 10 days) testing not required  Diagnosis: Combined systolic and diastolic congestive heart failure [id number]  Admitting Physician: Norval Morton [id number]  Attending Physician: Norval Morton [id number]  Certification:: I certify this patient will need inpatient services for at least 2 midnights  Estimated Length of Stay: 2          B Medical/Surgery History Past Medical History:  Diagnosis Date   Aortic insufficiency    a. 08/2019 Echo: mild to mod AI.   C. difficile diarrhea 07/2020   CKD (chronic kidney disease) stage 3, GFR 30-59 ml/min (HCC)    baseline Cr around 2.5   Combined systolic and diastolic congestive heart failure ([place]) 05/06/2022   Coronary atherosclerosis of native coronary artery    a. 02/2011 Late presentation MI-->Myoview w/  large area of transmural infarct in LCX distribution, no ischemia.   Depressive disorder, not elsewhere classified    Esophageal reflux    Gout, unspecified    Hemorrhoids    a. 01/2016 s/p hemorrhoidectomy.   HFrEF (heart failure with reduced ejection fraction) ([place])    a. 04/2012 Echo: EF 40%, inflat AK, Gr1 DD, mild AI/MR, triv TR; b. 11/2017 EchoP EF 30-35%, inflat HK; c. 08/2019 Echo: EF 30-35%, gr1 DD, inflat AK.   History of DVT (deep vein thrombosis)    a. Chronic Eliquis.   Hyperlipidemia LDL goal <70    Hypertension    Hypotension, unspecified    Ischemic cardiomyopathy    a. 04/2012 Echo: EF 40%, inflat AK, Gr1 DD, mild AI/MR, triv TR; b. 11/2017 EchoP EF 30-35%, inflat HK; c. 08/2019 Echo: EF 30-35%, gr1 DD, inflat AK. Nl RV size/fxn. Mild MR. Mild to mod AI.   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 04/2012 Echo: EF 40%, mild MR.   Myocardial infarction (lateral wall) ([place]) 2013   a. 02/2011 - late presentation. Managed conservatively 2/2 CKD;  b. 02/2011 Myoview: Large transmural infarct in the LCX distribution, no ischemia.   PAD (peripheral artery disease) ([place])    a. 08/2015 ABI/duplex: R: 0.86, L 0.96. Duplex w/ bilateral heterogeneous plaque in mid fem arteries, no significant stenoses; b. 08/2019 ABI/Duplex: prob bilat inflow dzs w/ stable ABIs (R 0.85, L 0.93).   Personal history of tobacco use, presenting hazards to  health    Torn ligament    Unspecified schizophrenia, unspecified condition    Past Surgical History:  Procedure Laterality Date   BIOPSY  12/17/2020   Procedure: BIOPSY;  Surgeon: Eloise Harman, DO;  [facility];  Service: Endoscopy;;   COLONOSCOPY     COLONOSCOPY N/A 11/10/2014   Procedure: COLONOSCOPY;  Surgeon: Manus Gunning, MD;  Location: [facility];  Service: Gastroenterology;  Laterality: N/A;   COLONOSCOPY WITH PROPOFOL N/A 05/08/2015   Surgeon: [name], MD; nonthrombosed external hemorrhoids, one 8 mm tubular adenoma,  one 4 mm tubular adenoma.  Repeat colonoscopy in 5-10 years.   COLONOSCOPY WITH PROPOFOL N/A 12/17/2020   Procedure: COLONOSCOPY WITH PROPOFOL;  Surgeon: Eloise Harman, DO;  [facility];  Service: Endoscopy;  Laterality: N/A;  8:30am   ESOPHAGOGASTRODUODENOSCOPY (EGD) WITH PROPOFOL N/A 05/08/2015   Surgeon: [name], MD; LA grade a reflux esophagitis, normal stomach and duodenum.   ESOPHAGOGASTRODUODENOSCOPY (EGD) WITH PROPOFOL N/A 12/17/2020   Procedure: ESOPHAGOGASTRODUODENOSCOPY (EGD) WITH PROPOFOL;  Surgeon: Eloise Harman, DO;  [facility];  Service: Endoscopy;  Laterality: N/A;   EYE SURGERY Right    removal of foreign body   HEMORRHOID SURGERY N/A 02/20/2016   Procedure: EXTENSIVE HEMORRHOIDECTOMY;  Surgeon: [name], MD;  Location: [facility];  Service: General;  Laterality: N/A;   None     POLYPECTOMY  05/08/2015   Procedure: POLYPECTOMY;  Surgeon: [name], MD;  [facility];  Service: Endoscopy;;  transverse colon polyp     A IV Location/Drains/Wounds Patient Lines/Drains/Airways Status     Active Line/Drains/Airways     Name Placement date Placement time Site Days   Peripheral IV 05/06/22 20 G Right Antecubital 05/06/22  0550  Antecubital  1            Intake/Output Last 24 hours  Intake/Output Summary (Last 24 hours) at 05/07/2022 0438 Last data filed at 05/06/2022 1937 Gross per 24 hour  Intake --  Output 2100 ml  Net -2100 ml    Labs/Imaging Results for orders placed or performed during the hospital encounter of 05/06/22 (from the past 48 hour(s))  Basic metabolic panel     Status: Abnormal   Collection Time: 05/06/22  3:58 AM  Result Value Ref Range   Sodium 141 135 - 145 mmol/L   Potassium 4.1 3.5 - 5.1 mmol/L   Chloride 111 98 - 111 mmol/L   CO2 23 22 - 32 mmol/L   Glucose, Bld 111 (H) 70 - 99 mg/dL    Comment: Glucose reference range applies only to samples taken after fasting for at least 8  hours.   BUN 24 (H) 8 - 23 mg/dL   Creatinine, Ser 3.25 (H) 0.61 - 1.24 mg/dL   Calcium 8.5 (L) 8.9 - 10.3 mg/dL   GFR, Estimated 20 (L) >60 mL/min    Comment: (NOTE) Calculated using the CKD-EPI Creatinine Equation (2021)    Anion gap 7 5 - 15    Comment: Performed at [facility] [address], [place] [address]  CBC     Status: Abnormal   Collection Time: 05/06/22  3:58 AM  Result Value Ref Range   WBC 7.4 4.0 - 10.5 K/uL   RBC 3.22 (L) 4.22 - 5.81 MIL/uL   Hemoglobin 9.5 (L) 13.0 - 17.0 g/dL   HCT 32.5 (L) 39.0 - 52.0 %   MCV 100.9 (H) 80.0 - 100.0  fL   MCH 29.5 26.0 - 34.0 pg   MCHC 29.2 (L) 30.0 - 36.0 g/dL   RDW 15.9 (H) 11.5 - 15.5 %   Platelets 113 (L) 150 - 400 K/uL   nRBC 0.0 0.0 - 0.2 %    Comment: Performed at [facility] [address], [place], [place] [address]  Troponin I (High Sensitivity)     Status: Abnormal   Collection Time: 05/06/22  3:58 AM  Result Value Ref Range   Troponin I (High Sensitivity) 55 (H) <18 ng/L    Comment: (NOTE) Elevated high sensitivity troponin I (hsTnI) values and significant  changes across serial measurements may suggest ACS but many other  chronic and acute conditions are known to elevate hsTnI results.  Refer to the "Links" section for chest pain algorithms and additional  guidance. Performed at [facility], [place] [address], [place], [place] [address]   Protime-INR (order if Patient is taking Coumadin / Warfarin)     Status: Abnormal   Collection Time: 05/06/22  3:58 AM  Result Value Ref Range   Prothrombin Time 16.6 (H) 11.4 - 15.2 seconds   INR 1.4 (H) 0.8 - 1.2    Comment: (NOTE) INR goal varies based on device and disease states. Performed at [facility], [place] [address] [place], [place] [address]   Brain natriuretic peptide     Status: Abnormal   Collection Time: 05/06/22  3:58 AM  Result Value Ref Range   B Natriuretic Peptide 1,699.1 (H) 0.0 - 100.0 pg/mL    Comment:  Performed at [facility] [address] [place], [place] [address]  Troponin I (High Sensitivity)     Status: Abnormal   Collection Time: 05/06/22  5:47 AM  Result Value Ref Range   Troponin I (High Sensitivity) 58 (H) <18 ng/L    Comment: (NOTE) Elevated high sensitivity troponin I (hsTnI) values and significant  changes across serial measurements may suggest ACS but many other  chronic and acute conditions are known to elevate hsTnI results.  Refer to the "Links" section for chest pain algorithms and additional  guidance. Performed at [facility], [place] [address] [place], [place] [address]   HIV Antibody (routine testing w rflx)     Status: None   Collection Time: 05/06/22  9:31 AM  Result Value Ref Range   HIV Screen 4th Generation wRfx Non Reactive Non Reactive    Comment: Performed at [facility], [place] [address], [place], [place] [address]  Procalcitonin     Status: None   Collection Time: 05/06/22  9:31 AM  Result Value Ref Range   Procalcitonin 0.47 ng/mL    Comment:        Interpretation: PCT (Procalcitonin) <= 0.5 ng/mL: Systemic infection (sepsis) is not likely. Local bacterial infection is possible. (NOTE)       Sepsis PCT Algorithm           Lower Respiratory Tract                                      Infection PCT Algorithm    ----------------------------     ----------------------------         PCT < 0.25 ng/mL                PCT < 0.10 ng/mL  Strongly encourage             Strongly discourage   discontinuation of antibiotics    initiation of antibiotics    ----------------------------     -----------------------------       PCT 0.25 - 0.50 ng/mL            PCT 0.10 - 0.25 ng/mL               OR       >80% decrease in PCT            Discourage initiation of                                            antibiotics      Encourage discontinuation           of antibiotics    ----------------------------      -----------------------------         PCT >= 0.50 ng/mL              PCT 0.26 - 0.50 ng/mL               AND        <80% decrease in PCT             Encourage initiation of                                             antibiotics       Encourage continuation           of antibiotics    ----------------------------     -----------------------------        PCT >= 0.50 ng/mL                  PCT > 0.50 ng/mL               AND         increase in PCT                  Strongly encourage                                      initiation of antibiotics    Strongly encourage escalation           of antibiotics                                     -----------------------------                                           PCT <= 0.25 ng/mL                                                 OR                                        >  80% decrease in PCT                                      Discontinue / Do not initiate                                             antibiotics  Performed at [facility], [place] [address] [place], [place] [address]   TSH     Status: None   Collection Time: 05/06/22  9:31 AM  Result Value Ref Range   TSH 1.413 0.350 - 4.500 uIU/mL    Comment: Performed by a 3rd Generation assay with a functional sensitivity of <=0.01 uIU/mL. Performed at [facility], [place] [address] [place], [place] [address]   Urinalysis, Routine w reflex microscopic -Urine, Clean Catch     Status: Abnormal   Collection Time: 05/06/22  2:42 PM  Result Value Ref Range   Color, Urine COLORLESS (A) YELLOW   APPearance CLEAR CLEAR   Specific Gravity, Urine 1.005 1.005 - 1.030   pH 5.0 5.0 - 8.0   Glucose, UA NEGATIVE NEGATIVE mg/dL   Hgb urine dipstick NEGATIVE NEGATIVE   Bilirubin Urine NEGATIVE NEGATIVE   Ketones, ur NEGATIVE NEGATIVE mg/dL   Protein, ur 100 (A) NEGATIVE mg/dL   Nitrite NEGATIVE NEGATIVE   Leukocytes,Ua NEGATIVE NEGATIVE   RBC / HPF 0-5 0 - 5 RBC/hpf   WBC, UA 0-5 0 -  5 WBC/hpf   Bacteria, UA NONE SEEN NONE SEEN   Squamous Epithelial / HPF 0-5 0 - 5 /HPF    Comment: Performed at [facility], [place] [address] [place], [place] [address]  CBC     Status: Abnormal   Collection Time: 05/07/22  1:11 AM  Result Value Ref Range   WBC 5.9 4.0 - 10.5 K/uL   RBC 2.78 (L) 4.22 - 5.81 MIL/uL   Hemoglobin 7.9 (L) 13.0 - 17.0 g/dL   HCT 27.4 (L) 39.0 - 52.0 %   MCV 98.6 80.0 - 100.0 fL   MCH 28.4 26.0 - 34.0 pg   MCHC 28.8 (L) 30.0 - 36.0 g/dL   RDW 15.7 (H) 11.5 - 15.5 %   Platelets 88 (L) 150 - 400 K/uL    Comment: Immature Platelet Fraction may be clinically indicated, consider ordering this additional test [id number] REPEATED TO VERIFY    nRBC 0.0 0.0 - 0.2 %    Comment: Performed at [facility], [place] [address], [place], [place] [address]  Comprehensive metabolic panel     Status: Abnormal   Collection Time: 05/07/22  1:11 AM  Result Value Ref Range   Sodium 139 135 - 145 mmol/L   Potassium 4.1 3.5 - 5.1 mmol/L   Chloride 106 98 - 111 mmol/L   CO2 24 22 - 32 mmol/L   Glucose, Bld 124 (H) 70 - 99 mg/dL    Comment: Glucose reference range applies only to samples taken after fasting for at least 8 hours.   BUN 34 (H) 8 - 23 mg/dL   Creatinine, Ser 3.44 (H) 0.61 - 1.24 mg/dL   Calcium 8.3 (L) 8.9 - 10.3 mg/dL   Total Protein 5.3 (L) 6.5 - 8.1 g/dL   Albumin 2.9 (L) 3.5 - 5.0 g/dL  AST 20 15 - 41 U/L   ALT 19 0 - 44 U/L   Alkaline Phosphatase 75 38 - 126 U/L   Total Bilirubin 0.7 0.3 - 1.2 mg/dL   GFR, Estimated 19 (L) >60 mL/min    Comment: (NOTE) Calculated using the CKD-EPI Creatinine Equation (2021)    Anion gap 9 5 - 15    Comment: Performed at [facility] [address], [place], [place] [address]   DG Chest 2 View  Result Date: 05/06/2022 CLINICAL DATA:  Shortness of breath, wheezing, and bloated sensation. EXAM: CHEST - 2 VIEW COMPARISON:  04/27/2022. FINDINGS: The heart is enlarged and the mediastinal contour is  stable. There is atherosclerotic calcification of the aorta. The pulmonary vasculature is prominent. Interstitial prominence with scattered airspace opacities are noted in the lungs bilaterally. There are small bilateral pleural effusions. No pneumothorax. No acute osseous abnormality. IMPRESSION: 1. Cardiomegaly with pulmonary vascular congestion. 2. Interstitial and airspace opacities in the lungs bilaterally, possible edema or infiltrate, and improved from the prior exam. 3. Small bilateral pleural effusions. Electronically Signed   By: Brett Fairy M.D.   On: 05/06/2022 04:18    Pending Labs Unresulted Labs (From admission, onward)     Start     Ordered   05/06/22 1139  Gastrointestinal Panel by PCR , Stool  (Gastrointestinal Panel by PCR, Stool                                                                                                                                                     **Does Not include CLOSTRIDIUM DIFFICILE testing. **If CDIFF testing is needed, place order from the "C Difficile Testing" order set.**)  Once,   R        05/06/22 1138   05/06/22 1134  C Difficile Quick Screen w PCR reflex  (C Difficile quick screen w PCR reflex panel )  Once, for 24 hours,   TIMED       References:    CDiff Information Tool   05/06/22 1133            Vitals/Pain Today's Vitals   05/07/22 0100 05/07/22 0256 05/07/22 0300 05/07/22 0400  BP: (!) 144/83  138/82 131/76  Pulse: 62  64 61  Resp: '14  12 15  '$ Temp:  97.7 F (36.5 C)    TempSrc:  Oral    SpO2: 100%  100% 96%  PainSc:        Isolation Precautions Enteric precautions (UV disinfection)  Medications Medications  furosemide (LASIX) injection 40 mg (40 mg Intravenous Given 05/06/22 1811)  sodium chloride flush (NS) 0.9 % injection 3 mL (3 mLs Intravenous Given 05/06/22 2209)  acetaminophen (TYLENOL) tablet 650 mg (has no administration in time range)    Or  acetaminophen (TYLENOL) suppository 650 mg (has no  administration in time range)  albuterol (PROVENTIL) (2.5 MG/3ML) 0.083% nebulizer solution 2.5 mg (has no administration in time range)  ferric gluconate (FERRLECIT) 250 mg in sodium chloride 0.9 % 250 mL IVPB (0 mg Intravenous Stopped 05/06/22 1745)  saccharomyces boulardii (FLORASTOR) capsule 250 mg (250 mg Oral Given 05/06/22 2208)  OLANZapine (ZYPREXA) tablet 5 mg (5 mg Oral Given 05/06/22 1435)  OLANZapine (ZYPREXA) tablet 15 mg (15 mg Oral Given 05/06/22 2208)  sertraline (ZOLOFT) tablet 100 mg (100 mg Oral Given 05/06/22 1344)  guaiFENesin (MUCINEX) 12 hr tablet 600 mg (600 mg Oral Given 05/06/22 2207)  sodium bicarbonate tablet 650 mg (650 mg Oral Given 05/06/22 2208)  risperiDONE (RISPERDAL) tablet 1 mg (1 mg Oral Given 05/06/22 2208)  rosuvastatin (CRESTOR) tablet 20 mg (20 mg Oral Given 05/06/22 1540)  metoprolol succinate (TOPROL-XL) 24 hr tablet 100 mg (100 mg Oral Given 05/06/22 1426)  isosorbide mononitrate (ISMO) tablet 20 mg (20 mg Oral Given 05/06/22 2208)  apixaban (ELIQUIS) tablet 2.5 mg (2.5 mg Oral Given 05/06/22 2207)  furosemide (LASIX) injection 80 mg (80 mg Intravenous Given 05/06/22 0553)    Mobility walks     Focused Assessments Cardiac Assessment Handoff:    No results found for: "CKTOTAL", "CKMB", "CKMBINDEX", "TROPONINI" Lab Results  Component Value Date   DDIMER 3.48 (H) 10/17/2014   Does the Patient currently have chest pain? No    R Recommendations: See Admitting Provider Note  Report given to:   Additional Notes: 3L [place], A+Ox4, walks to bathroom

## 2022-05-08 ENCOUNTER — Other Ambulatory Visit: Payer: Self-pay

## 2022-05-08 DIAGNOSIS — I5043 Acute on chronic combined systolic (congestive) and diastolic (congestive) heart failure: Secondary | ICD-10-CM | POA: Diagnosis not present

## 2022-05-08 DIAGNOSIS — I340 Nonrheumatic mitral (valve) insufficiency: Secondary | ICD-10-CM

## 2022-05-08 LAB — BASIC METABOLIC PANEL
Anion gap: 10 (ref 5–15)
BUN: 40 mg/dL — ABNORMAL HIGH (ref 8–23)
CO2: 21 mmol/L — ABNORMAL LOW (ref 22–32)
Calcium: 7.8 mg/dL — ABNORMAL LOW (ref 8.9–10.3)
Chloride: 106 mmol/L (ref 98–111)
Creatinine, Ser: 3.24 mg/dL — ABNORMAL HIGH (ref 0.61–1.24)
GFR, Estimated: 20 mL/min — ABNORMAL LOW (ref >60)
Glucose, Bld: 161 mg/dL — ABNORMAL HIGH (ref 70–99)
Potassium: 3.6 mmol/L (ref 3.5–5.1)
Sodium: 137 mmol/L (ref 135–145)

## 2022-05-08 LAB — CBC
HCT: 23.9 % — ABNORMAL LOW (ref 39.0–52.0)
Hemoglobin: 7.5 g/dL — ABNORMAL LOW (ref 13.0–17.0)
MCH: 29.4 pg (ref 26.0–34.0)
MCHC: 31.4 g/dL (ref 30.0–36.0)
MCV: 93.7 fL (ref 80.0–100.0)
Platelets: 87 10*3/uL — ABNORMAL LOW (ref 150–400)
RBC: 2.55 MIL/uL — ABNORMAL LOW (ref 4.22–5.81)
RDW: 15.7 % — ABNORMAL HIGH (ref 11.5–15.5)
WBC: 8.6 10*3/uL (ref 4.0–10.5)
nRBC: 0.2 % (ref 0.0–0.2)

## 2022-05-08 MED ORDER — PREDNISONE 20 MG PO TABS
40.0000 mg | ORAL_TABLET | Freq: Every day | ORAL | Status: DC
  Administered 2022-05-08 – 2022-05-09 (×2): 40 mg via ORAL
  Filled 2022-05-08 (×2): qty 2

## 2022-05-08 MED ORDER — PNEUMOCOCCAL 20-VAL CONJ VACC 0.5 ML IM SUSY
0.5000 mL | PREFILLED_SYRINGE | INTRAMUSCULAR | Status: AC
  Administered 2022-05-09: 0.5 mL via INTRAMUSCULAR
  Filled 2022-05-08: qty 0.5

## 2022-05-08 NOTE — Progress Notes (Signed)
  [facility] VALVE TEAM   Structural Heart team contacted for possible clip evaluation given severe MR on echocardiogram. He is scheduled for TOC follow up next week with Coletta Memos and TEE planned for 05/26/22 with Dr. Johnsie Cancel. Plan to give TEE instructions and draw labs at [facility] appointment. Once TEE reviewed and if anatomy suitable for TEER, we will set him up for outpatient [facility] consult.   Kathyrn Drown NP-C Structural Heart Team  Pager: [phone] Phone: [phone]

## 2022-05-08 NOTE — Progress Notes (Signed)
PROGRESS NOTE    Glenn Hawkins  [id number] DOB: December 24, 1955 DOA: 05/06/2022 PCP: Glenda Chroman, MD  67/male with chronic combined CHF, CAD, CKD 4, paranoid schizophrenia and GERD presented to the ED with shortness of breath.  Recently hospitalized 3/3-3/6 at [facility] with hypoxia from pneumonia, still reported being somewhat short of breath at discharge. -In the ED hypertensive, mildly hypoxic 88% on room air, labs noted hemoglobin of 9.5, creatinine of 3.2, troponin 55, 58, chest x-ray noted cardiomegaly pulmonary vascular congestion and concern for edema versus infiltrate, small bilateral pleural effusions   Subjective: -Feels fair overall breathing improving, has severe left knee pain  Assessment and Plan:  Acute respiratory failure with hypoxia Acute on chronic combined systolic and diastolic CHF Severe mitral regurgitation -Last echo 7/21 with EF of [id number] grade 1 diastolic dysfunction -Diuresed with IV Lasix, diuresed with IV Lasix, urine output inaccurate, cardiology following, echo noted with EF 35-40% with severe mitral regurgitation -Cards following, switch to oral Lasix -Continue low-dose beta-blocker, isosorbide mononitrate -GDMT limited by CKD, BMP in a.m., wean O2   Elevated troponin Acute.  High-sensitivity troponin 55-> 58.  No ACS, this is demand ischemia   Essential hypertension -Stable, meds as above   CKD stage IV Chronic.  Creatinine 3.25 which appears similar to prior.  Patient follows with Dr. Theador Hawthorne of [facility] -Continue sodium bicarbonate -Monitor, BMP in a.m.  Left knee gout flare, -CKD limits NSAID use, add prednisone   Iron deficiency anemia Chronic.  Hemoglobin 9.5 g/dL with MCV elevated 100.9.  Hemoglobin had been 8 g/dL on 3/6 on care everywhere.  He was also noted during that hospitalization to have total iron 35, TIBC 314, and iron saturation 11%.  Per sister plan was to start him on Epogen in the outpatient  setting. -Given IV iron   Diarrhea -Resolved   History of DVT on chronic anticoagulation -Continue Eliquis   Hyperlipidemia -Continue Crestor   Thrombocytopenia Chronic.  Platelet count 113 at this time, but appears to be similar to priors. -Continue to monitor   Paranoid schizophrenia Patient just recently had a long patient at the [facility] in February due to psychosis where her medication regimen had been changed. -Continue current regimen of sertraline, olanzapine, and risperidone   DVT prophylaxis:Eliquis Advance Care Planning:   Code Status: Full Code  Consults: Cardiology Disposition Plan: Home in 1 to 2 days   Procedures:   Antimicrobials:    Objective: Vitals:   05/08/22 0557 05/08/22 0732 05/08/22 0843 05/08/22 1116  BP:  (!) 151/97 (!) 148/93 (!) 151/91  Pulse:  74 85 77  Resp:  17  20  Temp:  98.7 F (37.1 C)  97.8 F (36.6 C)  TempSrc:  Oral  Oral  SpO2:  96%  98%  Weight: 81.2 kg     Height:        Intake/Output Summary (Last 24 hours) at 05/08/2022 1217 Last data filed at 05/08/2022 0849 Gross per 24 hour  Intake 721 ml  Output 300 ml  Net 421 ml   Filed Weights   05/07/22 0516 05/08/22 0557  Weight: 80.2 kg 81.2 kg    Examination:  General exam: Chronically ill male, sitting up in bed, AAOx3  HEENT: no JVD Lungs: Poor air movement bilaterally CVS: S1S2/RRR Abd: soft, Non tender, non distended, BS present Extremities: No edema left knee with swelling, warmth and limited range of motion Skin: No rashes Psychiatry:  Mood & affect appropriate.  Data Reviewed:   CBC: Recent Labs  Lab 05/06/22 0358 05/07/22 0111 05/08/22 0011  WBC 7.4 5.9 8.6  HGB 9.5* 7.9* 7.5*  HCT 32.5* 27.4* 23.9*  MCV 100.9* 98.6 93.7  PLT 113* 88* 87*   Basic Metabolic Panel: Recent Labs  Lab 05/06/22 0358 05/07/22 0111 05/08/22 0011  NA 141 139 137  K 4.1 4.1 3.6  CL 111 106 106  CO2 23 24 21*  GLUCOSE 111* 124* 161*  BUN 24* 34* 40*   CREATININE 3.25* 3.44* 3.24*  CALCIUM 8.5* 8.3* 7.8*   GFR: Estimated Creatinine Clearance: 22 mL/min (A) (by C-G formula based on SCr of 3.24 mg/dL (H)). Liver Function Tests: Recent Labs  Lab 05/07/22 0111  AST 20  ALT 19  ALKPHOS 75  BILITOT 0.7  PROT 5.3*  ALBUMIN 2.9*   No results for input(s): "LIPASE", "AMYLASE" in the last 168 hours. No results for input(s): "AMMONIA" in the last 168 hours. Coagulation Profile: Recent Labs  Lab 05/06/22 0358  INR 1.4*   Cardiac Enzymes: No results for input(s): "CKTOTAL", "CKMB", "CKMBINDEX", "TROPONINI" in the last 168 hours. BNP (last 3 results) No results for input(s): "PROBNP" in the last 8760 hours. HbA1C: No results for input(s): "HGBA1C" in the last 72 hours. CBG: No results for input(s): "GLUCAP" in the last 168 hours. Lipid Profile: No results for input(s): "CHOL", "HDL", "LDLCALC", "TRIG", "CHOLHDL", "LDLDIRECT" in the last 72 hours. Thyroid Function Tests: Recent Labs    05/06/22 0931  TSH 1.413   Anemia Panel: No results for input(s): "VITAMINB12", "FOLATE", "FERRITIN", "TIBC", "IRON", "RETICCTPCT" in the last 72 hours. Urine analysis:    Component Value Date/Time   COLORURINE COLORLESS (A) 05/06/2022 1442   APPEARANCEUR CLEAR 05/06/2022 1442   LABSPEC 1.005 05/06/2022 1442   PHURINE 5.0 05/06/2022 1442   GLUCOSEU NEGATIVE 05/06/2022 1442   HGBUR NEGATIVE 05/06/2022 1442   BILIRUBINUR NEGATIVE 05/06/2022 1442   KETONESUR NEGATIVE 05/06/2022 1442   PROTEINUR 100 (A) 05/06/2022 1442   UROBILINOGEN 0.2 10/17/2014 2008   NITRITE NEGATIVE 05/06/2022 1442   LEUKOCYTESUR NEGATIVE 05/06/2022 1442   Sepsis Labs: '@LABRCNTIP'$ (procalcitonin:4,lacticidven:4)  ) Recent Results (from the past 240 hour(s))  MRSA Next Gen by PCR, Nasal     Status: None   Collection Time: 05/07/22  5:34 AM   Specimen: Nasal Mucosa; Nasal Swab  Result Value Ref Range Status   MRSA by PCR Next Gen NOT DETECTED NOT DETECTED Final     Comment: (NOTE) The GeneXpert MRSA Assay (FDA approved for NASAL specimens only), is one component of a comprehensive MRSA colonization surveillance program. It is not intended to diagnose MRSA infection nor to guide or monitor treatment for MRSA infections. Test performance is not FDA approved in patients less than 72 years old. Performed at [facility], [place] [address], [place], [place] [address]      Radiology Studies: ECHOCARDIOGRAM COMPLETE  Result Date: 05/07/2022    ECHOCARDIOGRAM REPORT   Patient Name:   REGENALD MATURO Date of Exam: 05/07/2022 Medical Rec #:  [id number]      Height:       68.0 in Accession #:    [id number]     Weight:       176.8 lb Date of Birth:  14-Nov-1955       BSA:          1.939 m Patient Age:    84 years       BP:  154/89 mmHg Patient Gender: M              HR:           89 bpm. Exam Location:  Inpatient Procedure: 2D Echo, Color Doppler and Cardiac Doppler Indications:    [id number] Acute systolic (congestive) heart failure  History:        Patient has prior history of Echocardiogram examinations, most                 recent 09/15/2019. CHF, PAD; Risk Factors:Hypertension and                 Dyslipidemia.  Sonographer:    Raquel Sarna Senior RDCS Referring Phys: [phone] [place]  1. Left ventricular ejection fraction, by estimation, is 35 to 40%. The left ventricle has moderately decreased function. The left ventricle demonstrates regional wall motion abnormalities (see scoring diagram/findings for description). The left ventricular internal cavity size was moderately to severely dilated. Left ventricular diastolic parameters are indeterminate.  2. Right ventricular systolic function is mildly reduced. The right ventricular size is normal. There is mildly elevated pulmonary artery systolic pressure. The estimated right ventricular systolic pressure is [id number] mmHg.  3. Left atrial size was severely dilated.  4. A small pericardial effusion is present.  The pericardial effusion is localized near the right atrium.  5. 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 There is splay artifact. The mitral valve is grossly normal. Severe mitral valve regurgitation. No evidence of mitral stenosis.  6. The aortic valve is tricuspid. There is mild calcification of the aortic valve. Aortic valve regurgitation is mild to moderate. No aortic stenosis is present.  7. The inferior vena cava is dilated in size with >50% respiratory variability, suggesting right atrial pressure of 8 mmHg. FINDINGS  Left Ventricle: Left ventricular ejection fraction, by estimation, is 35 to 40%. The left ventricle has moderately decreased function. The left ventricle demonstrates regional wall motion abnormalities. The left ventricular internal cavity size was moderately to severely dilated. There is no left ventricular hypertrophy. Left ventricular diastolic function could not be evaluated due to mitral regurgitation (moderate or greater). Left ventricular diastolic parameters are indeterminate.  LV Wall Scoring: The mid and distal lateral wall and mid anterolateral segment are akinetic. The entire anterior wall, basal anterolateral segment, and mid inferior segment are hypokinetic. Right Ventricle: The right ventricular size is normal. No increase in right ventricular wall thickness. Right ventricular systolic function is mildly reduced. There is mildly elevated pulmonary artery systolic pressure. The tricuspid regurgitant velocity  is 2.88 m/s, and with an assumed right atrial pressure of 8 mmHg, the estimated right ventricular systolic pressure is [id number] mmHg. Left Atrium: Left atrial size was severely dilated. Right Atrium: Right atrial size was normal in size. Pericardium: A small pericardial effusion is present. The pericardial effusion is localized near the right atrium. Mitral Valve: Severe MR, mechanism  appears to be Carpentier IIIb, tethering of posterior leaflet due to lateral regional wall motion abnormalities. There is splay artifact. The mitral valve is grossly normal. Severe mitral valve regurgitation. No evidence of mitral valve stenosis. Tricuspid Valve: The tricuspid valve is normal in structure. Tricuspid valve regurgitation is mild . No evidence of tricuspid stenosis. Aortic Valve: The aortic valve is tricuspid. There is mild calcification of the aortic valve. Aortic valve regurgitation is mild to moderate. No aortic stenosis is present. Pulmonic Valve: The pulmonic valve  was normal in structure. Pulmonic valve regurgitation is mild. No evidence of pulmonic stenosis. Aorta: The aortic root is normal in size and structure. Venous: The inferior vena cava is dilated in size with greater than 50% respiratory variability, suggesting right atrial pressure of 8 mmHg. IAS/Shunts: No atrial level shunt detected by color flow Doppler.  LEFT VENTRICLE PLAX 2D LVIDd:         6.90 cm      Diastology LVIDs:         5.50 cm      LV e' medial:    5.55 cm/s LV PW:         1.00 cm      LV E/e' medial:  22.3 LV IVS:        0.80 cm      LV e' lateral:   9.36 cm/s LVOT diam:     2.40 cm      LV E/e' lateral: 13.2 LV SV:         106 LV SV Index:   55 LVOT Area:     4.52 cm  LV Volumes (MOD) LV vol d, MOD A2C: 200.0 ml LV vol d, MOD A4C: 180.0 ml LV vol s, MOD A2C: 127.0 ml LV vol s, MOD A4C: 115.0 ml LV SV MOD A2C:     73.0 ml LV SV MOD A4C:     180.0 ml LV SV MOD BP:      64.7 ml RIGHT VENTRICLE RV S prime:     14.60 cm/s TAPSE (M-mode): 2.1 cm LEFT ATRIUM              Index        RIGHT ATRIUM           Index LA diam:        5.00 cm  2.58 cm/m   RA Area:     17.80 cm LA Vol (A2C):   94.1 ml  48.52 ml/m  RA Volume:   50.40 ml  25.99 ml/m LA Vol (A4C):   101.0 ml 52.08 ml/m LA Biplane Vol: 97.6 ml  50.32 ml/m  AORTIC VALVE LVOT Vmax:   112.00 cm/s LVOT Vmean:  77.700 cm/s LVOT VTI:    0.235 m  AORTA Ao Root diam: 3.40  cm Ao Asc diam:  3.50 cm MITRAL VALVE                  TRICUSPID VALVE MV Area (PHT): 3.34 cm       TR Peak grad:   33.2 mmHg MV Decel Time: 227 msec       TR Vmax:        288.00 cm/s MR Peak grad:    141.4 mmHg MR Mean grad:    102.5 mmHg   SHUNTS MR Vmax:         594.50 cm/s  Systemic VTI:  0.24 m MR Vmean:        485.5 cm/s   Systemic Diam: 2.40 cm MR PISA:         1.90 cm MR PISA Eff ROA: 12 mm MR PISA Radius:  0.55 cm MV E velocity: 124.00 cm/s MV A velocity: 71.70 cm/s MV E/A ratio:  1.73 Cherlynn Kaiser MD Electronically signed by Cherlynn Kaiser MD Signature Date/Time: 05/07/2022/11:05:55 AM    Final      Scheduled Meds:  apixaban  2.5 mg Oral BID   furosemide  40 mg Oral Daily   guaiFENesin  600 mg Oral BID  isosorbide mononitrate  20 mg Oral BID   metoprolol succinate  100 mg Oral Daily   OLANZapine  15 mg Oral QHS   OLANZapine  5 mg Oral Daily   predniSONE  40 mg Oral Q breakfast   risperiDONE  1 mg Oral QHS   rosuvastatin  20 mg Oral Daily   saccharomyces boulardii  250 mg Oral BID   sertraline  100 mg Oral Daily   sodium bicarbonate  650 mg Oral BID   sodium chloride flush  3 mL Intravenous Q12H   Continuous Infusions:     LOS: 2 days    Time spent: 94mn    PDomenic Polite MD [facility]   05/08/2022, 12:17 PM

## 2022-05-08 NOTE — Progress Notes (Addendum)
DAILY PROGRESS NOTE   Patient Name: Glenn Hawkins Date of Encounter: 05/08/2022 Cardiologist: Kathlyn Sacramento, MD  Chief Complaint   Breathing better  Patient Profile   Glenn Hawkins is a 67 y.o. male with a hx of CAD, stage IV CKD, history of DVT August 2016, PAD, chronic combined systolic and diastolic heart failure, paranoid schizophrenia, hypertension and hyperlipidemia who is being seen 05/06/2022 for the evaluation of dyspnea at the request of Dr. Tamala Julian.   Subjective   Slightly positive yesterday - creatinine improved today to 3.24, BUN 40 - on steroids. Hemoglobin is trending down - now 7.5. Echo yesterday shows persistent systolic congestive heart failure, but LVEF is a little better at 35-40% with regional WMA's, there is mild RV dysfunction, severe LAE, mild to moderate AI and a small localized pericardial effusion. 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  He is known to have had a transmural LCx territory MI. RVSP was 41.2 mmHg.  Objective   Vitals:   05/08/22 0430 05/08/22 0557 05/08/22 0732 05/08/22 0843  BP: (!) 155/78  (!) 151/97 (!) 148/93  Pulse:   74 85  Resp: 20  17   Temp: 99.2 F (37.3 C)  98.7 F (37.1 C)   TempSrc: Oral  Oral   SpO2:   96%   Weight:  81.2 kg    Height:        Intake/Output Summary (Last 24 hours) at 05/08/2022 0913 Last data filed at 05/08/2022 0849 Gross per 24 hour  Intake 361 ml  Output 300 ml  Net 61 ml   Filed Weights   05/07/22 0516 05/08/22 0557  Weight: 80.2 kg 81.2 kg    Physical Exam   General appearance: alert and no distress Neck: JVD - 1 cm above sternal notch, no carotid bruit, and thyroid not enlarged, symmetric, no tenderness/mass/nodules Lungs: diminished breath sounds bibasilar Heart: regular rate and rhythm Abdomen: soft, non-tender; bowel sounds normal; no masses,  no organomegaly Extremities: extremities normal, atraumatic, no cyanosis or edema Pulses: 2+ and  symmetric Skin: Skin color, texture, turgor normal. No rashes or lesions Neurologic: Grossly normal Psych: Pleasant  Inpatient Medications    Scheduled Meds:  apixaban  2.5 mg Oral BID   furosemide  40 mg Oral Daily   guaiFENesin  600 mg Oral BID   isosorbide mononitrate  20 mg Oral BID   metoprolol succinate  100 mg Oral Daily   OLANZapine  15 mg Oral QHS   OLANZapine  5 mg Oral Daily   predniSONE  40 mg Oral Q breakfast   risperiDONE  1 mg Oral QHS   rosuvastatin  20 mg Oral Daily   saccharomyces boulardii  250 mg Oral BID   sertraline  100 mg Oral Daily   sodium bicarbonate  650 mg Oral BID   sodium chloride flush  3 mL Intravenous Q12H    Continuous Infusions:    PRN Meds: acetaminophen **OR** acetaminophen, albuterol   Labs   Results for orders placed or performed during the hospital encounter of 05/06/22 (from the past 48 hour(s))  HIV Antibody (routine testing w rflx)     Status: None   Collection Time: 05/06/22  9:31 AM  Result Value Ref Range   HIV Screen 4th Generation wRfx Non Reactive Non Reactive    Comment: Performed at [facility], [address], [place], [place] [address]  Procalcitonin     Status: None   Collection Time:  05/06/22  9:31 AM  Result Value Ref Range   Procalcitonin 0.47 ng/mL    Comment:        Interpretation: PCT (Procalcitonin) <= 0.5 ng/mL: Systemic infection (sepsis) is not likely. Local bacterial infection is possible. (NOTE)       Sepsis PCT Algorithm           Lower Respiratory Tract                                      Infection PCT Algorithm    ----------------------------     ----------------------------         PCT < 0.25 ng/mL                PCT < 0.10 ng/mL          Strongly encourage             Strongly discourage   discontinuation of antibiotics    initiation of antibiotics    ----------------------------     -----------------------------       PCT 0.25 - 0.50 ng/mL            PCT 0.10 - 0.25 ng/mL                OR       >80% decrease in PCT            Discourage initiation of                                            antibiotics      Encourage discontinuation           of antibiotics    ----------------------------     -----------------------------         PCT >= 0.50 ng/mL              PCT 0.26 - 0.50 ng/mL               AND        <80% decrease in PCT             Encourage initiation of                                             antibiotics       Encourage continuation           of antibiotics    ----------------------------     -----------------------------        PCT >= 0.50 ng/mL                  PCT > 0.50 ng/mL               AND         increase in PCT                  Strongly encourage                                      initiation of antibiotics    Strongly encourage escalation  of antibiotics                                     -----------------------------                                           PCT <= 0.25 ng/mL                                                 OR                                        > 80% decrease in PCT                                      Discontinue / Do not initiate                                             antibiotics  Performed at [facility], [place] [address], [place], [place] [address]   TSH     Status: None   Collection Time: 05/06/22  9:31 AM  Result Value Ref Range   TSH 1.413 0.350 - 4.500 uIU/mL    Comment: Performed by a 3rd Generation assay with a functional sensitivity of <=0.01 uIU/mL. Performed at [facility], [place] [address], [place], [place] [address]   Urinalysis, Routine w reflex microscopic -Urine, Clean Catch     Status: Abnormal   Collection Time: 05/06/22  2:42 PM  Result Value Ref Range   Color, Urine COLORLESS (A) YELLOW   APPearance CLEAR CLEAR   Specific Gravity, Urine 1.005 1.005 - 1.030   pH 5.0 5.0 - 8.0   Glucose, UA NEGATIVE NEGATIVE mg/dL   Hgb urine dipstick NEGATIVE NEGATIVE    Bilirubin Urine NEGATIVE NEGATIVE   Ketones, ur NEGATIVE NEGATIVE mg/dL   Protein, ur 100 (A) NEGATIVE mg/dL   Nitrite NEGATIVE NEGATIVE   Leukocytes,Ua NEGATIVE NEGATIVE   RBC / HPF 0-5 0 - 5 RBC/hpf   WBC, UA 0-5 0 - 5 WBC/hpf   Bacteria, UA NONE SEEN NONE SEEN   Squamous Epithelial / HPF 0-5 0 - 5 /HPF    Comment: Performed at [facility], [place] [address], [place], [place] [address]  CBC     Status: Abnormal   Collection Time: 05/07/22  1:11 AM  Result Value Ref Range   WBC 5.9 4.0 - 10.5 K/uL   RBC 2.78 (L) 4.22 - 5.81 MIL/uL   Hemoglobin 7.9 (L) 13.0 - 17.0 g/dL   HCT 27.4 (L) 39.0 - 52.0 %   MCV 98.6 80.0 - 100.0 fL   MCH 28.4 26.0 - 34.0 pg   MCHC 28.8 (L) 30.0 - 36.0 g/dL   RDW 15.7 (H) 11.5 - 15.5 %   Platelets 88 (L) 150 - 400 K/uL    Comment:  Immature Platelet Fraction may be clinically indicated, consider ordering this additional test [id number] REPEATED TO VERIFY    nRBC 0.0 0.0 - 0.2 %    Comment: Performed at [facility], [place] [address] [place], [place] [address]  Comprehensive metabolic panel     Status: Abnormal   Collection Time: 05/07/22  1:11 AM  Result Value Ref Range   Sodium 139 135 - 145 mmol/L   Potassium 4.1 3.5 - 5.1 mmol/L   Chloride 106 98 - 111 mmol/L   CO2 24 22 - 32 mmol/L   Glucose, Bld 124 (H) 70 - 99 mg/dL    Comment: Glucose reference range applies only to samples taken after fasting for at least 8 hours.   BUN 34 (H) 8 - 23 mg/dL   Creatinine, Ser 3.44 (H) 0.61 - 1.24 mg/dL   Calcium 8.3 (L) 8.9 - 10.3 mg/dL   Total Protein 5.3 (L) 6.5 - 8.1 g/dL   Albumin 2.9 (L) 3.5 - 5.0 g/dL   AST 20 15 - 41 U/L   ALT 19 0 - 44 U/L   Alkaline Phosphatase 75 38 - 126 U/L   Total Bilirubin 0.7 0.3 - 1.2 mg/dL   GFR, Estimated 19 (L) >60 mL/min    Comment: (NOTE) Calculated using the CKD-EPI Creatinine Equation (2021)    Anion gap 9 5 - 15    Comment: Performed at [facility], [place] [address] [place], [place] [address]   MRSA Next Gen by PCR, Nasal     Status: None   Collection Time: 05/07/22  5:34 AM   Specimen: Nasal Mucosa; Nasal Swab  Result Value Ref Range   MRSA by PCR Next Gen NOT DETECTED NOT DETECTED    Comment: (NOTE) The GeneXpert MRSA Assay (FDA approved for NASAL specimens only), is one component of a comprehensive MRSA colonization surveillance program. It is not intended to diagnose MRSA infection nor to guide or monitor treatment for MRSA infections. Test performance is not FDA approved in patients less than 68 years old. Performed at [facility], [place] [address], [place], [place] [address]   CBC     Status: Abnormal   Collection Time: 05/08/22 12:11 AM  Result Value Ref Range   WBC 8.6 4.0 - 10.5 K/uL   RBC 2.55 (L) 4.22 - 5.81 MIL/uL   Hemoglobin 7.5 (L) 13.0 - 17.0 g/dL   HCT 23.9 (L) 39.0 - 52.0 %   MCV 93.7 80.0 - 100.0 fL   MCH 29.4 26.0 - 34.0 pg   MCHC 31.4 30.0 - 36.0 g/dL   RDW 15.7 (H) 11.5 - 15.5 %   Platelets 87 (L) 150 - 400 K/uL    Comment: Immature Platelet Fraction may be clinically indicated, consider ordering this additional test [id number] REPEATED TO VERIFY    nRBC 0.2 0.0 - 0.2 %    Comment: Performed at [facility], [place] [address] [place], [place] [id number]  Basic metabolic panel     Status: Abnormal   Collection Time: 05/08/22 12:11 AM  Result Value Ref Range   Sodium 137 135 - 145 mmol/L   Potassium 3.6 3.5 - 5.1 mmol/L   Chloride 106 98 - 111 mmol/L   CO2 21 (L) 22 - 32 mmol/L   Glucose, Bld 161 (H) 70 - 99 mg/dL    Comment: Glucose reference range applies only to samples taken after fasting for at least 8 hours.   BUN 40 (H) 8 -  23 mg/dL   Creatinine, Ser 3.24 (H) 0.61 - 1.24 mg/dL   Calcium 7.8 (L) 8.9 - 10.3 mg/dL   GFR, Estimated 20 (L) >60 mL/min    Comment: (NOTE) Calculated using the CKD-EPI Creatinine Equation (2021)    Anion gap 10 5 - 15    Comment: Performed at [facility] [address] [place], [place]  [address]    ECG   N/A - Personally Reviewed  Telemetry   Sinus rhythm with couplet- Personally Reviewed  Radiology    ECHOCARDIOGRAM COMPLETE  Result Date: 05/07/2022    ECHOCARDIOGRAM REPORT   Patient Name:   Glenn Hawkins Date of Exam: 05/07/2022 Medical Rec #:  [id number]      Height:       68.0 in Accession #:    [id number]     Weight:       176.8 lb Date of Birth:  07-Jun-1955       BSA:          1.939 m Patient Age:    27 years       BP:           154/89 mmHg Patient Gender: M              HR:           89 bpm. Exam Location:  Inpatient Procedure: 2D Echo, Color Doppler and Cardiac Doppler Indications:    [id number] Acute systolic (congestive) heart failure  History:        Patient has prior history of Echocardiogram examinations, most                 recent 09/15/2019. CHF, PAD; Risk Factors:Hypertension and                 Dyslipidemia.  Sonographer:    Raquel Sarna Senior RDCS Referring Phys: [id number] [place]  1. Left ventricular ejection fraction, by estimation, is 35 to 40%. The left ventricle has moderately decreased function. The left ventricle demonstrates regional wall motion abnormalities (see scoring diagram/findings for description). The left ventricular internal cavity size was moderately to severely dilated. Left ventricular diastolic parameters are indeterminate.  2. Right ventricular systolic function is mildly reduced. The right ventricular size is normal. There is mildly elevated pulmonary artery systolic pressure. The estimated right ventricular systolic pressure is [id number] mmHg.  3. Left atrial size was severely dilated.  4. A small pericardial effusion is present. The pericardial effusion is localized near the right atrium.  5. 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 There is splay artifact. The mitral valve is grossly normal. Severe mitral valve regurgitation. No evidence of mitral  stenosis.  6. The aortic valve is tricuspid. There is mild calcification of the aortic valve. Aortic valve regurgitation is mild to moderate. No aortic stenosis is present.  7. The inferior vena cava is dilated in size with >50% respiratory variability, suggesting right atrial pressure of 8 mmHg. FINDINGS  Left Ventricle: Left ventricular ejection fraction, by estimation, is 35 to 40%. The left ventricle has moderately decreased function. The left ventricle demonstrates regional wall motion abnormalities. The left ventricular internal cavity size was moderately to severely dilated. There is no left ventricular hypertrophy. Left ventricular diastolic function could not be evaluated due to mitral regurgitation (moderate or greater). Left ventricular diastolic parameters are indeterminate.  LV Wall Scoring: The mid and distal lateral wall and  mid anterolateral segment are akinetic. The entire anterior wall, basal anterolateral segment, and mid inferior segment are hypokinetic. Right Ventricle: The right ventricular size is normal. No increase in right ventricular wall thickness. Right ventricular systolic function is mildly reduced. There is mildly elevated pulmonary artery systolic pressure. The tricuspid regurgitant velocity  is 2.88 m/s, and with an assumed right atrial pressure of 8 mmHg, the estimated right ventricular systolic pressure is [id number] mmHg. Left Atrium: Left atrial size was severely dilated. Right Atrium: Right atrial size was normal in size. Pericardium: A small pericardial effusion is present. The pericardial effusion is localized near the right atrium. 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 There is splay artifact. The mitral valve is grossly normal. Severe mitral valve regurgitation. No evidence of mitral valve stenosis. Tricuspid Valve: The tricuspid valve is normal in structure. Tricuspid  valve regurgitation is mild . No evidence of tricuspid stenosis. Aortic Valve: The aortic valve is tricuspid. There is mild calcification of the aortic valve. Aortic valve regurgitation is mild to moderate. No aortic stenosis is present. Pulmonic Valve: The pulmonic valve was normal in structure. Pulmonic valve regurgitation is mild. No evidence of pulmonic stenosis. Aorta: The aortic root is normal in size and structure. Venous: The inferior vena cava is dilated in size with greater than 50% respiratory variability, suggesting right atrial pressure of 8 mmHg. IAS/Shunts: No atrial level shunt detected by color flow Doppler.  LEFT VENTRICLE PLAX 2D LVIDd:         6.90 cm      Diastology LVIDs:         5.50 cm      LV e' medial:    5.55 cm/s LV PW:         1.00 cm      LV E/e' medial:  22.3 LV IVS:        0.80 cm      LV e' lateral:   9.36 cm/s LVOT diam:     2.40 cm      LV E/e' lateral: 13.2 LV SV:         106 LV SV Index:   55 LVOT Area:     4.52 cm  LV Volumes (MOD) LV vol d, MOD A2C: 200.0 ml LV vol d, MOD A4C: 180.0 ml LV vol s, MOD A2C: 127.0 ml LV vol s, MOD A4C: 115.0 ml LV SV MOD A2C:     73.0 ml LV SV MOD A4C:     180.0 ml LV SV MOD BP:      64.7 ml RIGHT VENTRICLE RV S prime:     14.60 cm/s TAPSE (M-mode): 2.1 cm LEFT ATRIUM              Index        RIGHT ATRIUM           Index LA diam:        5.00 cm  2.58 cm/m   RA Area:     17.80 cm LA Vol (A2C):   94.1 ml  48.52 ml/m  RA Volume:   50.40 ml  25.99 ml/m LA Vol (A4C):   101.0 ml 52.08 ml/m LA Biplane Vol: 97.6 ml  50.32 ml/m  AORTIC VALVE LVOT Vmax:   112.00 cm/s LVOT Vmean:  77.700 cm/s LVOT VTI:    0.235 m  AORTA Ao Root diam: 3.40 cm Ao Asc diam:  3.50 cm  MITRAL VALVE                  TRICUSPID VALVE MV Area (PHT): 3.34 cm       TR Peak grad:   33.2 mmHg MV Decel Time: 227 msec       TR Vmax:        288.00 cm/s MR Peak grad:    141.4 mmHg MR Mean grad:    102.5 mmHg   SHUNTS MR Vmax:         594.50 cm/s  Systemic VTI:  0.24 m MR Vmean:         485.5 cm/s   Systemic Diam: 2.40 cm MR PISA:         1.90 cm MR PISA Eff ROA: 12 mm MR PISA Radius:  0.55 cm MV E velocity: 124.00 cm/s MV A velocity: 71.70 cm/s MV E/A ratio:  1.73 Cherlynn Kaiser MD Electronically signed by Cherlynn Kaiser MD Signature Date/Time: 05/07/2022/11:05:55 AM    Final     Cardiac Studies   Echo above  Assessment   Principal Problem:   Combined systolic and diastolic congestive heart failure (HCC) Active Problems:   Hypertension   Hyperlipidemia   Diarrhea   Acute respiratory failure with hypoxia (HCC)   Elevated troponin   Iron deficiency anemia   History of DVT (deep vein thrombosis)   Chronic anticoagulation   Thrombocytopenia (HCC)   Paranoid schizophrenia ([place])   Plan   Creatinine improved -probably euvolemic at this point. Will start maintenance oral lasix today. Echo shows LVEF 35-40% - probably the best it will be given prior LCx territory transmural infarct. 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 This is likely playing a role in his CHF.  Will review echo images with structural heart team to determine if he may be a MitraClip candidate in the future (not likely during this hospitalization). He is currently on Toprol XL  - no ACE-I/ARB/ARNI given renal disease, would also avoid aldactone. Could consider adding SGLT2 inhibitor in the future.   Time Spent Directly with Patient:  I have spent a total of 25 minutes with the patient reviewing hospital notes, telemetry, EKGs, labs and examining the patient as well as establishing an assessment and plan that was discussed personally with the patient.  > 50% of time was spent in direct patient care.  Length of Stay:  LOS: 2 days   Pixie Casino, MD, [facility], [place] Director of the Advanced Lipid Disorders &  Cardiovascular Risk Reduction Clinic Diplomate of the American Board of Clinical Lipidology Attending Cardiologist  Direct Dial:  [phone]  Fax: [phone]  Website:  [email or web address] 05/08/2022, 9:13 AM

## 2022-05-09 ENCOUNTER — Other Ambulatory Visit (HOSPITAL_COMMUNITY): Payer: Self-pay

## 2022-05-09 DIAGNOSIS — I340 Nonrheumatic mitral (valve) insufficiency: Secondary | ICD-10-CM | POA: Diagnosis not present

## 2022-05-09 DIAGNOSIS — I5043 Acute on chronic combined systolic (congestive) and diastolic (congestive) heart failure: Secondary | ICD-10-CM | POA: Diagnosis not present

## 2022-05-09 LAB — IRON AND TIBC
Iron: 74 ug/dL (ref 45–182)
Saturation Ratios: 21 % (ref 17.9–39.5)
TIBC: 361 ug/dL (ref 250–450)
UIBC: 287 ug/dL

## 2022-05-09 LAB — CBC
HCT: 25.7 % — ABNORMAL LOW (ref 39.0–52.0)
Hemoglobin: 7.7 g/dL — ABNORMAL LOW (ref 13.0–17.0)
MCH: 28.7 pg (ref 26.0–34.0)
MCHC: 30 g/dL (ref 30.0–36.0)
MCV: 95.9 fL (ref 80.0–100.0)
Platelets: 77 10*3/uL — ABNORMAL LOW (ref 150–400)
RBC: 2.68 MIL/uL — ABNORMAL LOW (ref 4.22–5.81)
RDW: 15.6 % — ABNORMAL HIGH (ref 11.5–15.5)
WBC: 9.7 10*3/uL (ref 4.0–10.5)
nRBC: 0 % (ref 0.0–0.2)

## 2022-05-09 LAB — BASIC METABOLIC PANEL
Anion gap: 9 (ref 5–15)
BUN: 45 mg/dL — ABNORMAL HIGH (ref 8–23)
CO2: 25 mmol/L (ref 22–32)
Calcium: 8.3 mg/dL — ABNORMAL LOW (ref 8.9–10.3)
Chloride: 107 mmol/L (ref 98–111)
Creatinine, Ser: 3.45 mg/dL — ABNORMAL HIGH (ref 0.61–1.24)
GFR, Estimated: 19 mL/min — ABNORMAL LOW (ref >60)
Glucose, Bld: 125 mg/dL — ABNORMAL HIGH (ref 70–99)
Potassium: 3.7 mmol/L (ref 3.5–5.1)
Sodium: 141 mmol/L (ref 135–145)

## 2022-05-09 LAB — RETICULOCYTES
Immature Retic Fract: 34 % — ABNORMAL HIGH (ref 2.3–15.9)
RBC.: 2.6 MIL/uL — ABNORMAL LOW (ref 4.22–5.81)
Retic Count, Absolute: 54.6 10*3/uL (ref 19.0–186.0)
Retic Ct Pct: 2.1 % (ref 0.4–3.1)

## 2022-05-09 LAB — FERRITIN: Ferritin: 73 ng/mL (ref 24–336)

## 2022-05-09 LAB — FOLATE: Folate: 6.2 ng/mL (ref >5.9)

## 2022-05-09 LAB — VITAMIN B12: Vitamin B-12: 278 pg/mL (ref 180–914)

## 2022-05-09 MED ORDER — FUROSEMIDE 40 MG PO TABS
40.0000 mg | ORAL_TABLET | Freq: Every day | ORAL | 0 refills | Status: DC
  Filled 2022-05-09: qty 30, 30d supply, fill #0

## 2022-05-09 MED ORDER — ISOSORBIDE MONONITRATE ER 30 MG PO TB24: 30.0000 mg | ORAL_TABLET | Freq: Every morning | ORAL | 0 refills | Status: DC

## 2022-05-09 MED ORDER — PREDNISONE 20 MG PO TABS
20.0000 mg | ORAL_TABLET | Freq: Every day | ORAL | 0 refills | Status: AC
  Filled 2022-05-09: qty 2, 2d supply, fill #0

## 2022-05-09 MED ORDER — ISOSORBIDE MONONITRATE ER 30 MG PO TB24
30.0000 mg | ORAL_TABLET | Freq: Every morning | ORAL | 0 refills | Status: DC
  Filled 2022-05-09: qty 30, 30d supply, fill #0

## 2022-05-09 MED ORDER — ALLOPURINOL 100 MG PO TABS: 100.0000 mg | ORAL_TABLET | Freq: Every day | ORAL | 0 refills | Status: DC

## 2022-05-09 MED ORDER — ALLOPURINOL 100 MG PO TABS
100.0000 mg | ORAL_TABLET | Freq: Every day | ORAL | 0 refills | Status: DC
  Filled 2022-05-09: qty 30, 30d supply, fill #0

## 2022-05-09 MED ORDER — RISPERIDONE 1 MG PO TABS: 1.0000 mg | ORAL_TABLET | Freq: Every day | ORAL | 0 refills | Status: DC

## 2022-05-09 MED ORDER — RISPERIDONE 1 MG PO TABS
1.0000 mg | ORAL_TABLET | Freq: Every day | ORAL | 0 refills | Status: DC
  Filled 2022-05-09: qty 30, 30d supply, fill #0

## 2022-05-09 MED ORDER — PREDNISONE 20 MG PO TABS: 20.0000 mg | ORAL_TABLET | Freq: Every day | ORAL | 0 refills | Status: DC

## 2022-05-09 MED ORDER — FUROSEMIDE 40 MG PO TABS: 40.0000 mg | ORAL_TABLET | Freq: Every day | ORAL | 0 refills | Status: DC

## 2022-05-09 NOTE — Progress Notes (Signed)
Heart Failure Navigator Progress Note  Assessed for Heart & Vascular TOC clinic readiness.  Patient EF 35-40%, Has [facility] appointment scheduled for 05/15/2022, will also have Structural Heart consult for Mitral Clip as outpatient. .   Navigator will sign off at this time.   Earnestine Leys, BSN, Clinical cytogeneticist Only

## 2022-05-09 NOTE — Progress Notes (Signed)
DAILY PROGRESS NOTE   Patient Name: Glenn Hawkins Date of Encounter: 05/09/2022 Cardiologist: Kathlyn Sacramento, MD  Chief Complaint   No complaints.  Patient Profile   Glenn Hawkins is a 67 y.o. male with a hx of CAD, stage IV CKD, history of DVT August 2016, PAD, chronic combined systolic and diastolic heart failure, paranoid schizophrenia, hypertension and hyperlipidemia who is being seen 05/06/2022 for the evaluation of dyspnea at the request of Dr. Tamala Julian.   Subjective   No issues overnight - discussed case with the structural heart team. They will work to arrange outpatient TEE and evaluation for possible MitraClip for severe ischemic MR. Creatinine up and down, but generally stable. Even I's/O's. On lasix 40 mg po daily.  Objective   Vitals:   05/08/22 2311 05/09/22 0345 05/09/22 0540 05/09/22 0714  BP: (!) 149/74 138/80  139/76  Pulse: 75 70  67  Resp:    12  Temp: 98.2 F (36.8 C) 98.2 F (36.8 C)  98.2 F (36.8 C)  TempSrc: Oral Oral  Oral  SpO2: 98% 97%  96%  Weight:   81.3 kg   Height:        Intake/Output Summary (Last 24 hours) at 05/09/2022 [id number] Last data filed at 05/09/2022 0346 Gross per 24 hour  Intake 843 ml  Output 450 ml  Net 393 ml   Filed Weights   05/07/22 0516 05/08/22 0557 05/09/22 0540  Weight: 80.2 kg 81.2 kg 81.3 kg    Physical Exam   General appearance: alert and no distress Neck: no carotid bruit, no JVD, and thyroid not enlarged, symmetric, no tenderness/mass/nodules Lungs: diminished breath sounds bibasilar Heart: regular rate and rhythm Abdomen: soft, non-tender; bowel sounds normal; no masses,  no organomegaly Extremities: extremities normal, atraumatic, no cyanosis or edema Pulses: 2+ and symmetric Skin: Skin color, texture, turgor normal. No rashes or lesions Neurologic: Grossly normal Psych: Pleasant  Inpatient Medications    Scheduled Meds:  apixaban  2.5 mg Oral BID   furosemide  40 mg Oral Daily   guaiFENesin   600 mg Oral BID   isosorbide mononitrate  20 mg Oral BID   metoprolol succinate  100 mg Oral Daily   OLANZapine  15 mg Oral QHS   OLANZapine  5 mg Oral Daily   pneumococcal 20-valent conjugate vaccine  0.5 mL Intramuscular Tomorrow-1000   predniSONE  40 mg Oral Q breakfast   risperiDONE  1 mg Oral QHS   rosuvastatin  20 mg Oral Daily   saccharomyces boulardii  250 mg Oral BID   sertraline  100 mg Oral Daily   sodium bicarbonate  650 mg Oral BID   sodium chloride flush  3 mL Intravenous Q12H    Continuous Infusions:    PRN Meds: acetaminophen **OR** acetaminophen, albuterol   Labs   Results for orders placed or performed during the hospital encounter of 05/06/22 (from the past 48 hour(s))  CBC     Status: Abnormal   Collection Time: 05/08/22 12:11 AM  Result Value Ref Range   WBC 8.6 4.0 - 10.5 K/uL   RBC 2.55 (L) 4.22 - 5.81 MIL/uL   Hemoglobin 7.5 (L) 13.0 - 17.0 g/dL   HCT 23.9 (L) 39.0 - 52.0 %   MCV 93.7 80.0 - 100.0 fL   MCH 29.4 26.0 - 34.0 pg   MCHC 31.4 30.0 - 36.0 g/dL   RDW 15.7 (H) 11.5 - 15.5 %   Platelets 87 (L) 150 - 400  K/uL    Comment: Immature Platelet Fraction may be clinically indicated, consider ordering this additional test [id number] REPEATED TO VERIFY    nRBC 0.2 0.0 - 0.2 %    Comment: Performed at [facility], [place] [address], [place], [place] [id number]  Basic metabolic panel     Status: Abnormal   Collection Time: 05/08/22 12:11 AM  Result Value Ref Range   Sodium 137 135 - 145 mmol/L   Potassium 3.6 3.5 - 5.1 mmol/L   Chloride 106 98 - 111 mmol/L   CO2 21 (L) 22 - 32 mmol/L   Glucose, Bld 161 (H) 70 - 99 mg/dL    Comment: Glucose reference range applies only to samples taken after fasting for at least 8 hours.   BUN 40 (H) 8 - 23 mg/dL   Creatinine, Ser 3.24 (H) 0.61 - 1.24 mg/dL   Calcium 7.8 (L) 8.9 - 10.3 mg/dL   GFR, Estimated 20 (L) >60 mL/min    Comment: (NOTE) Calculated using the CKD-EPI Creatinine Equation (2021)     Anion gap 10 5 - 15    Comment: Performed at [facility] [address] [place], [place] [address]  CBC     Status: Abnormal   Collection Time: 05/08/22 11:43 PM  Result Value Ref Range   WBC 9.7 4.0 - 10.5 K/uL   RBC 2.68 (L) 4.22 - 5.81 MIL/uL   Hemoglobin 7.7 (L) 13.0 - 17.0 g/dL   HCT 25.7 (L) 39.0 - 52.0 %   MCV 95.9 80.0 - 100.0 fL   MCH 28.7 26.0 - 34.0 pg   MCHC 30.0 30.0 - 36.0 g/dL   RDW 15.6 (H) 11.5 - 15.5 %   Platelets 77 (L) 150 - 400 K/uL    Comment: Immature Platelet Fraction may be clinically indicated, consider ordering this additional test [id number] REPEATED TO VERIFY    nRBC 0.0 0.0 - 0.2 %    Comment: Performed at [facility], [place] [address] [place], [place] [id number]  Basic metabolic panel     Status: Abnormal   Collection Time: 05/08/22 11:43 PM  Result Value Ref Range   Sodium 141 135 - 145 mmol/L   Potassium 3.7 3.5 - 5.1 mmol/L   Chloride 107 98 - 111 mmol/L   CO2 25 22 - 32 mmol/L   Glucose, Bld 125 (H) 70 - 99 mg/dL    Comment: Glucose reference range applies only to samples taken after fasting for at least 8 hours.   BUN 45 (H) 8 - 23 mg/dL   Creatinine, Ser 3.45 (H) 0.61 - 1.24 mg/dL   Calcium 8.3 (L) 8.9 - 10.3 mg/dL   GFR, Estimated 19 (L) >60 mL/min    Comment: (NOTE) Calculated using the CKD-EPI Creatinine Equation (2021)    Anion gap 9 5 - 15    Comment: Performed at [facility] [address] [place], [place] [address]    ECG   N/A - Personally Reviewed  Telemetry   Sinus rhythm- Personally Reviewed  Radiology    ECHOCARDIOGRAM COMPLETE  Result Date: 05/07/2022    ECHOCARDIOGRAM REPORT   Patient Name:   Glenn Hawkins Date of Exam: 05/07/2022 Medical Rec #:  [id number]      Height:       68.0 in Accession #:    [id number]     Weight:       176.8 lb Date of Birth:  December 14, 1955  BSA:          1.939 m Patient Age:    25 years       BP:           154/89 mmHg Patient Gender: M              HR:           89  bpm. Exam Location:  Inpatient Procedure: 2D Echo, Color Doppler and Cardiac Doppler Indications:    [id number] Acute systolic (congestive) heart failure  History:        Patient has prior history of Echocardiogram examinations, most                 recent 09/15/2019. CHF, PAD; Risk Factors:Hypertension and                 Dyslipidemia.  Sonographer:    Raquel Sarna Senior RDCS Referring Phys: [id number] [place]  1. Left ventricular ejection fraction, by estimation, is 35 to 40%. The left ventricle has moderately decreased function. The left ventricle demonstrates regional wall motion abnormalities (see scoring diagram/findings for description). The left ventricular internal cavity size was moderately to severely dilated. Left ventricular diastolic parameters are indeterminate.  2. Right ventricular systolic function is mildly reduced. The right ventricular size is normal. There is mildly elevated pulmonary artery systolic pressure. The estimated right ventricular systolic pressure is [id number] mmHg.  3. Left atrial size was severely dilated.  4. A small pericardial effusion is present. The pericardial effusion is localized near the right atrium.  5. 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 There is splay artifact. The mitral valve is grossly normal. Severe mitral valve regurgitation. No evidence of mitral stenosis.  6. The aortic valve is tricuspid. There is mild calcification of the aortic valve. Aortic valve regurgitation is mild to moderate. No aortic stenosis is present.  7. The inferior vena cava is dilated in size with >50% respiratory variability, suggesting right atrial pressure of 8 mmHg. FINDINGS  Left Ventricle: Left ventricular ejection fraction, by estimation, is 35 to 40%. The left ventricle has moderately decreased function. The left ventricle demonstrates regional wall motion abnormalities. The left  ventricular internal cavity size was moderately to severely dilated. There is no left ventricular hypertrophy. Left ventricular diastolic function could not be evaluated due to mitral regurgitation (moderate or greater). Left ventricular diastolic parameters are indeterminate.  LV Wall Scoring: The mid and distal lateral wall and mid anterolateral segment are akinetic. The entire anterior wall, basal anterolateral segment, and mid inferior segment are hypokinetic. Right Ventricle: The right ventricular size is normal. No increase in right ventricular wall thickness. Right ventricular systolic function is mildly reduced. There is mildly elevated pulmonary artery systolic pressure. The tricuspid regurgitant velocity  is 2.88 m/s, and with an assumed right atrial pressure of 8 mmHg, the estimated right ventricular systolic pressure is [id number] mmHg. Left Atrium: Left atrial size was severely dilated. Right Atrium: Right atrial size was normal in size. Pericardium: A small pericardial effusion is present. The pericardial effusion is localized near the right atrium. 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 There is splay artifact. The mitral valve is grossly normal. Severe mitral valve regurgitation. No evidence of mitral valve stenosis. Tricuspid Valve: The tricuspid valve is normal in structure. Tricuspid valve regurgitation is mild . No  evidence of tricuspid stenosis. Aortic Valve: The aortic valve is tricuspid. There is mild calcification of the aortic valve. Aortic valve regurgitation is mild to moderate. No aortic stenosis is present. Pulmonic Valve: The pulmonic valve was normal in structure. Pulmonic valve regurgitation is mild. No evidence of pulmonic stenosis. Aorta: The aortic root is normal in size and structure. Venous: The inferior vena cava is dilated in size with greater than 50% respiratory variability,  suggesting right atrial pressure of 8 mmHg. IAS/Shunts: No atrial level shunt detected by color flow Doppler.  LEFT VENTRICLE PLAX 2D LVIDd:         6.90 cm      Diastology LVIDs:         5.50 cm      LV e' medial:    5.55 cm/s LV PW:         1.00 cm      LV E/e' medial:  22.3 LV IVS:        0.80 cm      LV e' lateral:   9.36 cm/s LVOT diam:     2.40 cm      LV E/e' lateral: 13.2 LV SV:         106 LV SV Index:   55 LVOT Area:     4.52 cm  LV Volumes (MOD) LV vol d, MOD A2C: 200.0 ml LV vol d, MOD A4C: 180.0 ml LV vol s, MOD A2C: 127.0 ml LV vol s, MOD A4C: 115.0 ml LV SV MOD A2C:     73.0 ml LV SV MOD A4C:     180.0 ml LV SV MOD BP:      64.7 ml RIGHT VENTRICLE RV S prime:     14.60 cm/s TAPSE (M-mode): 2.1 cm LEFT ATRIUM              Index        RIGHT ATRIUM           Index LA diam:        5.00 cm  2.58 cm/m   RA Area:     17.80 cm LA Vol (A2C):   94.1 ml  48.52 ml/m  RA Volume:   50.40 ml  25.99 ml/m LA Vol (A4C):   101.0 ml 52.08 ml/m LA Biplane Vol: 97.6 ml  50.32 ml/m  AORTIC VALVE LVOT Vmax:   112.00 cm/s LVOT Vmean:  77.700 cm/s LVOT VTI:    0.235 m  AORTA Ao Root diam: 3.40 cm Ao Asc diam:  3.50 cm MITRAL VALVE                  TRICUSPID VALVE MV Area (PHT): 3.34 cm       TR Peak grad:   33.2 mmHg MV Decel Time: 227 msec       TR Vmax:        288.00 cm/s MR Peak grad:    141.4 mmHg MR Mean grad:    102.5 mmHg   SHUNTS MR Vmax:         594.50 cm/s  Systemic VTI:  0.24 m MR Vmean:        485.5 cm/s   Systemic Diam: 2.40 cm MR PISA:         1.90 cm MR PISA Eff ROA: 12 mm MR PISA Radius:  0.55 cm MV E velocity: 124.00 cm/s MV A velocity: 71.70 cm/s MV E/A ratio:  1.73 Cherlynn Kaiser MD Electronically signed by Cherlynn Kaiser MD Signature Date/Time:  05/07/2022/11:05:55 AM    Final     Cardiac Studies   Echo above  Assessment   Principal Problem:   Combined systolic and diastolic congestive heart failure (HCC) Active Problems:   Hypertension   Hyperlipidemia   Diarrhea   Acute  respiratory failure with hypoxia (HCC)   Elevated troponin   Iron deficiency anemia   History of DVT (deep vein thrombosis)   Chronic anticoagulation   Thrombocytopenia (HCC)   Paranoid schizophrenia ([place])   Plan   Appears euvolemic. On maintenance dose lasix. Creatinine relatively stable. Plan in place for outpatient evaluation of severe MR - possible MitraClip.  Ok to d/c home today from a cardiac standpoint. Follow-up has been arranged.  [place] will sign off.   Medication Recommendations:  as above Other recommendations (labs, testing, etc):  none Follow up as an outpatient:  arranged   Time Spent Directly with Patient:  I have spent a total of 25 minutes with the patient reviewing hospital notes, telemetry, EKGs, labs and examining the patient as well as establishing an assessment and plan that was discussed personally with the patient.  > 50% of time was spent in direct patient care.  Length of Stay:  LOS: 3 days   Pixie Casino, MD, [facility], [place] Director of the Advanced Lipid Disorders &  Cardiovascular Risk Reduction Clinic Diplomate of the American Board of Clinical Lipidology Attending Cardiologist  Direct Dial: [phone]  Fax: [phone]  Website:  [email or web address] 05/09/2022, 8:42 AM

## 2022-05-09 NOTE — Discharge Summary (Signed)
Physician Discharge Summary  Glenn Hawkins [id number] DOB: 21-Aug-1955 DOA: 05/06/2022  PCP: Glenda Chroman, MD  Admit date: 05/06/2022 Discharge date: 05/09/2022  Time spent: 45 minutes  Recommendations for Outpatient Follow-up:  Cardiology for TEE on 4/1 followed by structural heart team Cardiology Dr. Fletcher Anon in 2 to 3 weeks Nephrology Dr. Theador Hawthorne in 3 weeks   Discharge Diagnoses:  Principal Problem: Acute on chronic combined systolic and diastolic congestive heart failure (HCC) Severe MR regurgitation CKD 4   Acute respiratory failure with hypoxia (HCC)   Elevated troponin   Hypertension   Iron deficiency anemia   Diarrhea   History of DVT (deep vein thrombosis)   Chronic anticoagulation   Hyperlipidemia   Thrombocytopenia (HCC)   Paranoid schizophrenia ([place])   Discharge Condition: Improved  Diet recommendation: Low-sodium, heart healthy  Filed Weights   05/07/22 0516 05/08/22 0557 05/09/22 0540  Weight: 80.2 kg 81.2 kg 81.3 kg    History of present illness:  66/male with chronic combined CHF, CAD, CKD 4, paranoid schizophrenia and GERD presented to the ED with shortness of breath.  Recently hospitalized 3/3-3/6 at [facility] with hypoxia from pneumonia, still reported being somewhat short of breath at discharge. -In the ED hypertensive, mildly hypoxic 88% on room air, labs noted hemoglobin of 9.5, creatinine of 3.2, troponin 55, 58, chest x-ray noted cardiomegaly pulmonary vascular congestion and concern for edema versus infiltrate, small bilateral pleural effusions  Hospital Course:   Acute respiratory failure with hypoxia Acute on chronic combined systolic and diastolic CHF Severe mitral regurgitation -Last echo 7/21 with EF of 99991111 grade 1 diastolic dysfunction -Diuresed with IV Lasix, diuresed with IV Lasix, volume status has improved, weaned off O2 followed by G, repeat echo noted with EF 35-40% with severe mitral regurgitation -Transitioned to oral  Lasix 40 Mg daily per cards recommendations -Continue low-dose beta-blocker, isosorbide mononitrate -GDMT limited by CKD,  -Cardiology has arranged TEE as outpatient in 2 weeks followed by follow-up with structural heart team  Elevated troponin Acute.  High-sensitivity troponin 55-> 58.  No ACS, this is demand ischemia   Essential hypertension -Stable, meds as above   CKD stage IV Chronic.  Creatinine 3.25 which appears similar to prior.  Patient follows with Dr. Theador Hawthorne of [facility] -Stable continue sodium bicarbonate, Lasix 40 Mg daily, follow-up with Dr. Verne Spurr in 2 to 3 weeks  Left knee gout flare, -CKD limits NSAID use, treated with short course of prednisone, patient refuses allopurinol   Iron deficiency anemia Chronic.  Hemoglobin in the 7.5-8 range, also noted during recent hospitalization to have total iron 35, TIBC 314, and iron saturation 11%.  Per sister plan was to start him on Epogen in the outpatient setting. -Given IV iron this admission, hemoglobin   Diarrhea -Resolved   History of DVT on chronic anticoagulation -Continue Eliquis   Hyperlipidemia -Continue Crestor   Thrombocytopenia Chronic.  Platelet count 113 at this time, but appears to be similar to priors. -Continue to monitor   Paranoid schizophrenia Patient just recently had a long patient at the [facility] in February due to psychosis where her medication regimen had been changed. -Continue current regimen of sertraline, olanzapine, and risperidone  Discharge Exam: Vitals:   05/09/22 1022 05/09/22 1056  BP: (!) 148/82 (!) 147/85  Pulse: 73   Resp:  20  Temp:  98.2 F (36.8 C)  SpO2:  96%   Gen: Awake, Alert, Oriented X 3, mild cognitive deficits HEENT: no JVD Lungs: Good  air movement bilaterally, CTAB CVS: S1S2/RRR Abd: soft, Non tender, non distended, BS present Extremities: No edema Skin: no new rashes on exposed skin   Discharge Instructions   Discharge  Instructions     Diet - low sodium heart healthy   Complete by: As directed    Increase activity slowly   Complete by: As directed       Allergies as of 05/09/2022       Reactions   Nifedipine Diarrhea   Rabeprazole Other (See Comments)   unknown   Benazepril Other (See Comments)   unknown   Haloperidol Anxiety   Causes severe anxiety   Omeprazole Other (See Comments)   unknown        Medication List     STOP taking these medications    amLODipine 10 MG tablet Commonly known as: NORVASC   Colcrys 0.6 MG tablet Generic drug: colchicine   lactulose 10 GM/15ML solution Commonly known as: CHRONULAC   spironolactone 25 MG tablet Commonly known as: ALDACTONE       TAKE these medications    allopurinol 100 MG tablet Commonly known as: ZYLOPRIM Take 1 tablet (100 mg total) by mouth daily.   apixaban 2.5 MG Tabs tablet Commonly known as: ELIQUIS Take 2.5 mg by mouth 2 (two) times daily.   brimonidine 0.2 % ophthalmic solution Commonly known as: ALPHAGAN Place 1 drop into both eyes 3 (three) times daily.   Daridorexant HCl 25 MG Tabs Take 25 mg by mouth at bedtime.   esomeprazole 20 MG capsule Commonly known as: NexIUM Take 1 capsule (20 mg total) by mouth daily before breakfast.   furosemide 40 MG tablet Commonly known as: LASIX Take 1 tablet (40 mg total) by mouth daily.   isosorbide mononitrate 30 MG 24 hr tablet Commonly known as: IMDUR Take 1 tablet (30 mg total) by mouth every morning. Patient needs appointment for further refills. 1 st attempt What changed:  medication strength how much to take   latanoprost 0.005 % ophthalmic solution Commonly known as: XALATAN Place 1 drop into both eyes at bedtime.   melatonin 3 MG Tabs tablet Take 3 mg by mouth at bedtime.   metoprolol tartrate 100 MG tablet Commonly known as: LOPRESSOR Take 100 mg by mouth 2 (two) times daily.   nitroGLYCERIN 0.4 MG SL tablet Commonly known as: Nitrostat Place  1 tablet (0.4 mg total) under the tongue every 5 (five) minutes as needed for chest pain.   OLANZapine 5 MG tablet Commonly known as: ZYPREXA Take 5 mg by mouth daily. What changed: Another medication with the same name was removed. Continue taking this medication, and follow the directions you see here.   OLANZapine 15 MG tablet Commonly known as: ZYPREXA Take 15 mg by mouth at bedtime. What changed: Another medication with the same name was removed. Continue taking this medication, and follow the directions you see here.   predniSONE 20 MG tablet Commonly known as: DELTASONE Take 1 tablet (20 mg total) by mouth daily with breakfast for 2 days. Start taking on: May 10, 2022   risperiDONE 1 MG tablet Commonly known as: RISPERDAL Take 1 tablet (1 mg total) by mouth at bedtime. What changed:  medication strength how much to take   rosuvastatin 40 MG tablet Commonly known as: CRESTOR Take 20 mg by mouth daily.   sertraline 100 MG tablet Commonly known as: ZOLOFT Take 100 mg by mouth daily.   sodium bicarbonate 650 MG tablet Take 650 mg by  mouth 2 (two) times daily.       Allergies  Allergen Reactions   Nifedipine Diarrhea   Rabeprazole Other (See Comments)    unknown   Benazepril Other (See Comments)    unknown   Haloperidol Anxiety    Causes severe anxiety   Omeprazole Other (See Comments)    unknown    Follow-up Information     Deberah Pelton, NP Follow up on 05/15/2022.   Specialty: Cardiology Why: @ 10:05am. Please arrive at 9:50am. Contact information: [address] [place] [address] [phone]                  The results of significant diagnostics from this hospitalization (including imaging, microbiology, ancillary and laboratory) are listed below for reference.    Significant Diagnostic Studies: ECHOCARDIOGRAM COMPLETE  Result Date: 05/07/2022    ECHOCARDIOGRAM REPORT   Patient Name:   BENTLEIGH KRENGEL Date of Exam:  05/07/2022 Medical Rec #:  [id number]      Height:       68.0 in Accession #:    [id number]     Weight:       176.8 lb Date of Birth:  Feb 07, 1956       BSA:          1.939 m Patient Age:    16 years       BP:           154/89 mmHg Patient Gender: M              HR:           89 bpm. Exam Location:  Inpatient Procedure: 2D Echo, Color Doppler and Cardiac Doppler Indications:    [id number] Acute systolic (congestive) heart failure  History:        Patient has prior history of Echocardiogram examinations, most                 recent 09/15/2019. CHF, PAD; Risk Factors:Hypertension and                 Dyslipidemia.  Sonographer:    Raquel Sarna Senior RDCS Referring Phys: [phone] [place]  1. Left ventricular ejection fraction, by estimation, is 35 to 40%. The left ventricle has moderately decreased function. The left ventricle demonstrates regional wall motion abnormalities (see scoring diagram/findings for description). The left ventricular internal cavity size was moderately to severely dilated. Left ventricular diastolic parameters are indeterminate.  2. Right ventricular systolic function is mildly reduced. The right ventricular size is normal. There is mildly elevated pulmonary artery systolic pressure. The estimated right ventricular systolic pressure is [id number] mmHg.  3. Left atrial size was severely dilated.  4. A small pericardial effusion is present. The pericardial effusion is localized near the right atrium.  5. 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 There is splay artifact. The mitral valve is grossly normal. Severe mitral valve regurgitation. No evidence of mitral stenosis.  6. The aortic valve is tricuspid. There is mild calcification of the aortic valve. Aortic valve regurgitation is mild to moderate. No aortic stenosis is present.  7. The inferior vena cava is dilated in size with >50% respiratory  variability, suggesting right atrial pressure of 8 mmHg. FINDINGS  Left Ventricle: Left ventricular ejection fraction, by estimation, is 35 to 40%. The left ventricle has moderately decreased function. The left ventricle demonstrates regional wall motion abnormalities. The  left ventricular internal cavity size was moderately to severely dilated. There is no left ventricular hypertrophy. Left ventricular diastolic function could not be evaluated due to mitral regurgitation (moderate or greater). Left ventricular diastolic parameters are indeterminate.  LV Wall Scoring: The mid and distal lateral wall and mid anterolateral segment are akinetic. The entire anterior wall, basal anterolateral segment, and mid inferior segment are hypokinetic. Right Ventricle: The right ventricular size is normal. No increase in right ventricular wall thickness. Right ventricular systolic function is mildly reduced. There is mildly elevated pulmonary artery systolic pressure. The tricuspid regurgitant velocity  is 2.88 m/s, and with an assumed right atrial pressure of 8 mmHg, the estimated right ventricular systolic pressure is [id number] mmHg. Left Atrium: Left atrial size was severely dilated. Right Atrium: Right atrial size was normal in size. Pericardium: A small pericardial effusion is present. The pericardial effusion is localized near the right atrium. 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 There is splay artifact. The mitral valve is grossly normal. Severe mitral valve regurgitation. No evidence of mitral valve stenosis. Tricuspid Valve: The tricuspid valve is normal in structure. Tricuspid valve regurgitation is mild . No evidence of tricuspid stenosis. Aortic Valve: The aortic valve is tricuspid. There is mild calcification of the aortic valve. Aortic valve regurgitation is mild to moderate. No aortic stenosis is present.  Pulmonic Valve: The pulmonic valve was normal in structure. Pulmonic valve regurgitation is mild. No evidence of pulmonic stenosis. Aorta: The aortic root is normal in size and structure. Venous: The inferior vena cava is dilated in size with greater than 50% respiratory variability, suggesting right atrial pressure of 8 mmHg. IAS/Shunts: No atrial level shunt detected by color flow Doppler.  LEFT VENTRICLE PLAX 2D LVIDd:         6.90 cm      Diastology LVIDs:         5.50 cm      LV e' medial:    5.55 cm/s LV PW:         1.00 cm      LV E/e' medial:  22.3 LV IVS:        0.80 cm      LV e' lateral:   9.36 cm/s LVOT diam:     2.40 cm      LV E/e' lateral: 13.2 LV SV:         106 LV SV Index:   55 LVOT Area:     4.52 cm  LV Volumes (MOD) LV vol d, MOD A2C: 200.0 ml LV vol d, MOD A4C: 180.0 ml LV vol s, MOD A2C: 127.0 ml LV vol s, MOD A4C: 115.0 ml LV SV MOD A2C:     73.0 ml LV SV MOD A4C:     180.0 ml LV SV MOD BP:      64.7 ml RIGHT VENTRICLE RV S prime:     14.60 cm/s TAPSE (M-mode): 2.1 cm LEFT ATRIUM              Index        RIGHT ATRIUM           Index LA diam:        5.00 cm  2.58 cm/m   RA Area:     17.80 cm LA Vol (A2C):   94.1 ml  48.52 ml/m  RA Volume:   50.40 ml  25.99 ml/m LA Vol (A4C):  101.0 ml 52.08 ml/m LA Biplane Vol: 97.6 ml  50.32 ml/m  AORTIC VALVE LVOT Vmax:   112.00 cm/s LVOT Vmean:  77.700 cm/s LVOT VTI:    0.235 m  AORTA Ao Root diam: 3.40 cm Ao Asc diam:  3.50 cm MITRAL VALVE                  TRICUSPID VALVE MV Area (PHT): 3.34 cm       TR Peak grad:   33.2 mmHg MV Decel Time: 227 msec       TR Vmax:        288.00 cm/s MR Peak grad:    141.4 mmHg MR Mean grad:    102.5 mmHg   SHUNTS MR Vmax:         594.50 cm/s  Systemic VTI:  0.24 m MR Vmean:        485.5 cm/s   Systemic Diam: 2.40 cm MR PISA:         1.90 cm MR PISA Eff ROA: 12 mm MR PISA Radius:  0.55 cm MV E velocity: 124.00 cm/s MV A velocity: 71.70 cm/s MV E/A ratio:  1.73 Cherlynn Kaiser MD Electronically signed by Cherlynn Kaiser MD Signature Date/Time: 05/07/2022/11:05:55 AM    Final    DG Chest 2 View  Result Date: 05/06/2022 CLINICAL DATA:  Shortness of breath, wheezing, and bloated sensation. EXAM: CHEST - 2 VIEW COMPARISON:  04/27/2022. FINDINGS: The heart is enlarged and the mediastinal contour is stable. There is atherosclerotic calcification of the aorta. The pulmonary vasculature is prominent. Interstitial prominence with scattered airspace opacities are noted in the lungs bilaterally. There are small bilateral pleural effusions. No pneumothorax. No acute osseous abnormality. IMPRESSION: 1. Cardiomegaly with pulmonary vascular congestion. 2. Interstitial and airspace opacities in the lungs bilaterally, possible edema or infiltrate, and improved from the prior exam. 3. Small bilateral pleural effusions. Electronically Signed   By: Brett Fairy M.D.   On: 05/06/2022 04:18    Microbiology: Recent Results (from the past 240 hour(s))  MRSA Next Gen by PCR, Nasal     Status: None   Collection Time: 05/07/22  5:34 AM   Specimen: Nasal Mucosa; Nasal Swab  Result Value Ref Range Status   MRSA by PCR Next Gen NOT DETECTED NOT DETECTED Final    Comment: (NOTE) The GeneXpert MRSA Assay (FDA approved for NASAL specimens only), is one component of a comprehensive MRSA colonization surveillance program. It is not intended to diagnose MRSA infection nor to guide or monitor treatment for MRSA infections. Test performance is not FDA approved in patients less than 49 years old. Performed at [facility], [place] [address] [place], [place] [address]      Labs: Basic Metabolic Panel: Recent Labs  Lab 05/06/22 0358 05/07/22 0111 05/08/22 0011 05/08/22 2343  NA 141 139 137 141  K 4.1 4.1 3.6 3.7  CL 111 106 106 107  CO2 23 24 21* 25  GLUCOSE 111* 124* 161* 125*  BUN 24* 34* 40* 45*  CREATININE 3.25* 3.44* 3.24* 3.45*  CALCIUM 8.5* 8.3* 7.8* 8.3*   Liver Function Tests: Recent Labs  Lab 05/07/22 0111   AST 20  ALT 19  ALKPHOS 75  BILITOT 0.7  PROT 5.3*  ALBUMIN 2.9*   No results for input(s): "LIPASE", "AMYLASE" in the last 168 hours. No results for input(s): "AMMONIA" in the last 168 hours. CBC: Recent Labs  Lab 05/06/22 0358 05/07/22 0111 05/08/22 0011 05/08/22 2343  WBC 7.4 5.9 8.6 9.7  HGB 9.5* 7.9* 7.5* 7.7*  HCT 32.5* 27.4* 23.9* 25.7*  MCV 100.9* 98.6 93.7 95.9  PLT 113* 88* 87* 77*   Cardiac Enzymes: No results for input(s): "CKTOTAL", "CKMB", "CKMBINDEX", "TROPONINI" in the last 168 hours. BNP: BNP (last 3 results) Recent Labs    05/06/22 0358  BNP 1,699.1*    ProBNP (last 3 results) No results for input(s): "PROBNP" in the last 8760 hours.  CBG: No results for input(s): "GLUCAP" in the last 168 hours.     Signed:  Domenic Polite MD.  [facility] 05/09/2022, 2:39 PM

## 2022-05-09 NOTE — TOC Transition Note (Signed)
Transition of Care [facility]) - CM/SW Discharge Note   Patient Details  Name: Glenn Hawkins MRN: [id number] Date of Birth: 03/12/1955  [facility] CM/SW Contact:  Zenon Mayo, RN Phone Number: 05/09/2022, 11:15 AM   Clinical Narrative:    Patient is for dc today, his sister will transport him home today. Has no needs.         Patient Goals and CMS Choice      Discharge Placement                         Discharge Plan and Services Additional resources added to the After Visit Summary for                                       Social Determinants of Health (SDOH) Interventions SDOH Screenings   Food Insecurity: No Food Insecurity (05/07/2022)  Housing: Low Risk  (05/07/2022)  Transportation Needs: No Transportation Needs (05/07/2022)  Utilities: Not At Risk (05/07/2022)  Tobacco Use: Medium Risk (05/06/2022)     Readmission Risk Interventions     No data to display

## 2022-05-12 DIAGNOSIS — N184 Chronic kidney disease, stage 4 (severe): Secondary | ICD-10-CM | POA: Diagnosis not present

## 2022-05-13 NOTE — Progress Notes (Unsigned)
Cardiology Clinic Note   Patient Name: Glenn Hawkins Date of Encounter: 05/15/2022  Primary Care Provider:  Glenda Chroman, MD Primary Cardiologist:  Kathlyn Sacramento, MD  Patient Profile    Glenn Hawkins 67 year old male present to the clinic today for follow-up evaluation of his coronary artery disease and ischemic cardiomyopathy.  Past Medical History    Past Medical History:  Diagnosis Date   Aortic insufficiency    a. 08/2019 Echo: mild to mod AI.   C. difficile diarrhea 07/2020   CKD (chronic kidney disease) stage 3, GFR 30-59 ml/min (HCC)    baseline Cr around 2.5   Combined systolic and diastolic congestive heart failure ([place]) 05/06/2022   Coronary atherosclerosis of native coronary artery    a. 02/2011 Late presentation MI-->Myoview w/ large area of transmural infarct in LCX distribution, no ischemia.   Depressive disorder, not elsewhere classified    Esophageal reflux    Gout, unspecified    Hemorrhoids    a. 01/2016 s/p hemorrhoidectomy.   HFrEF (heart failure with reduced ejection fraction) ([place])    a. 04/2012 Echo: EF 40%, inflat AK, Gr1 DD, mild AI/MR, triv TR; b. 11/2017 EchoP EF 30-35%, inflat HK; c. 08/2019 Echo: EF 30-35%, gr1 DD, inflat AK.   History of DVT (deep vein thrombosis)    a. Chronic Eliquis.   Hyperlipidemia LDL goal <70    Hypertension    Hypotension, unspecified    Ischemic cardiomyopathy    a. 04/2012 Echo: EF 40%, inflat AK, Gr1 DD, mild AI/MR, triv TR; b. 11/2017 EchoP EF 30-35%, inflat HK; c. 08/2019 Echo: EF 30-35%, gr1 DD, inflat AK. Nl RV size/fxn. Mild MR. Mild to mod AI.   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 04/2012 Echo: EF 40%, mild MR.   Myocardial infarction (lateral wall) ([place]) 2013   a. 02/2011 - late presentation. Managed conservatively 2/2 CKD;  b. 02/2011 Myoview: Large transmural infarct in the LCX distribution, no ischemia.   PAD (peripheral artery disease) ([place])    a. 08/2015 ABI/duplex: R: 0.86, L 0.96. Duplex w/ bilateral  heterogeneous plaque in mid fem arteries, no significant stenoses; b. 08/2019 ABI/Duplex: prob bilat inflow dzs w/ stable ABIs (R 0.85, L 0.93).   Personal history of tobacco use, presenting hazards to health    Torn ligament    Unspecified schizophrenia, unspecified condition    Past Surgical History:  Procedure Laterality Date   BIOPSY  12/17/2020   Procedure: BIOPSY;  Surgeon: Eloise Harman, DO;  [facility];  Service: Endoscopy;;   COLONOSCOPY     COLONOSCOPY N/A 11/10/2014   Procedure: COLONOSCOPY;  Surgeon: Manus Gunning, MD;  Location: [facility];  Service: Gastroenterology;  Laterality: N/A;   COLONOSCOPY WITH PROPOFOL N/A 05/08/2015   Surgeon: [name], MD; nonthrombosed external hemorrhoids, one 8 mm tubular adenoma, one 4 mm tubular adenoma.  Repeat colonoscopy in 5-10 years.   COLONOSCOPY WITH PROPOFOL N/A 12/17/2020   Procedure: COLONOSCOPY WITH PROPOFOL;  Surgeon: Eloise Harman, DO;  [facility];  Service: Endoscopy;  Laterality: N/A;  8:30am   ESOPHAGOGASTRODUODENOSCOPY (EGD) WITH PROPOFOL N/A 05/08/2015   Surgeon: [name], MD; LA grade a reflux esophagitis, normal stomach and duodenum.   ESOPHAGOGASTRODUODENOSCOPY (EGD) WITH PROPOFOL N/A 12/17/2020   Procedure: ESOPHAGOGASTRODUODENOSCOPY (EGD) WITH PROPOFOL;  Surgeon: Eloise Harman, DO;  [facility];  Service: Endoscopy;  Laterality: N/A;   EYE SURGERY Right    removal  of foreign body   HEMORRHOID SURGERY N/A 02/20/2016   Procedure: EXTENSIVE HEMORRHOIDECTOMY;  Surgeon: [name], MD;  Location: [facility];  Service: General;  Laterality: N/A;   None     POLYPECTOMY  05/08/2015   Procedure: POLYPECTOMY;  Surgeon: [name], MD;  [facility];  Service: Endoscopy;;  transverse colon polyp    Allergies  Allergies  Allergen Reactions   Nifedipine Diarrhea   Rabeprazole Other (See Comments)    unknown   Benazepril Other (See Comments)     unknown   Haloperidol Anxiety    Causes severe anxiety   Omeprazole Other (See Comments)    unknown    History of Present Illness    Glenn Hawkins has a PMH of coronary artery disease, HTN, ischemic cardiomyopathy, chronic systolic CHF, GERD, CKD stage III, obesity, hyperlipidemia, iron deficiency anemia, chronic anticoagulation, and paranoid schizophrenia.  Echocardiogram 7/21 showed EF of 30-35% with G1 DD.    Is admitted to the hospital on 05/07/2022 and discharged on 05/09/2022.  He presented to the ED with shortness of breath.  He had been hospitalized 04/27/2022 until 04/30/2022 around the hospital with hypoxia from pneumonia.  He reported still being somewhat short of breath since discharge.  The emergency department he was hypertensive, hypoxic at 88% on room air.  His hemoglobin was noted to be 9.5.  His creatinine was 3.2.  His troponins are 55 and 58.  His chest x-ray showed cardiomegaly, pulmonary vascular congestion, and concern for edema versus infiltrates.  He was noted to have small bilateral pleural effusion.  He received IV diuresis and his volume status improved.  He was weaned off of O2.  His repeat echocardiogram showed improved EF of 35-40% with severe mitral valve regurgitation.  He was transitioned to p.o. furosemide.  His low-dose beta-blocker was resumed as well as isosorbide mononitrate.  His GDMT was limited due to his CKD.  Follow-up TEE in 2 weeks with structural heart was planned.  It was felt that his troponins were elevated due to demand ischemia.  He received IV iron for his hemoglobin of 7.5-8 which was chronic.  He presents to the clinic today for follow-up evaluation and states he feels so-so.  He reports compliance with his apixaban and denies bleeding issues.  He is fairly sedentary at home.  We reviewed his recent hospitalizations.  He and his wife expressed understanding.  We reviewed his upcoming TEE.  He continues to follow a low-sodium diet.  I asked him to  continue to increase his physical activity as tolerated.  We will continue his current medication regimen and plan follow-up in 4 months.  Today he denies chest pain, shortness of breath, lower extremity edema, fatigue, palpitations, melena, hematuria, hemoptysis, diaphoresis, weakness, presyncope, syncope, orthopnea, and PND.    Home Medications    Prior to Admission medications   Medication Sig Start Date End Date Taking? Authorizing Provider  allopurinol (ZYLOPRIM) 100 MG tablet Take 1 tablet (100 mg total) by mouth daily. 05/09/22   Domenic Polite, MD  apixaban (ELIQUIS) 2.5 MG TABS tablet Take 2.5 mg by mouth 2 (two) times daily.    [provider]  brimonidine (ALPHAGAN) 0.2 % ophthalmic solution Place 1 drop into both eyes 3 (three) times daily. 04/18/22   [provider]  Daridorexant HCl 25 MG TABS Take 25 mg by mouth at bedtime.    [provider]  esomeprazole (NEXIUM) 20 MG capsule Take 1 capsule (20 mg total)  by mouth daily before breakfast. Patient not taking: Reported on 05/07/2022 11/08/20   Erenest Rasher, PA-C  furosemide (LASIX) 40 MG tablet Take 1 tablet (40 mg total) by mouth daily. 05/09/22   Domenic Polite, MD  isosorbide mononitrate (IMDUR) 30 MG 24 hr tablet Take 1 tablet (30 mg total) by mouth every morning. Patient needs appointment for further refills. 1 st attempt 05/09/22   Domenic Polite, MD  latanoprost (XALATAN) 0.005 % ophthalmic solution Place 1 drop into both eyes at bedtime. 10/11/20   [provider]  melatonin 3 MG TABS tablet Take 3 mg by mouth at bedtime. 04/16/22   [provider]  metoprolol tartrate (LOPRESSOR) 100 MG tablet Take 100 mg by mouth 2 (two) times daily. 10/11/20   [provider]  nitroGLYCERIN (NITROSTAT) 0.4 MG SL tablet Place 1 tablet (0.4 mg total) under the tongue every 5 (five) minutes as needed for chest pain. Patient not taking: Reported on 12/11/2020 03/21/16   Wellington Hampshire,  MD  OLANZapine (ZYPREXA) 15 MG tablet Take 15 mg by mouth at bedtime. 03/24/22   [provider]  OLANZapine (ZYPREXA) 5 MG tablet Take 5 mg by mouth daily.    [provider]  risperiDONE (RISPERDAL) 1 MG tablet Take 1 tablet (1 mg total) by mouth at bedtime. 05/09/22   Domenic Polite, MD  rosuvastatin (CRESTOR) 40 MG tablet Take 20 mg by mouth daily.    [provider]  sertraline (ZOLOFT) 100 MG tablet Take 100 mg by mouth daily.    [provider]  sodium bicarbonate 650 MG tablet Take 650 mg by mouth 2 (two) times daily.    [provider]    Family History    Family History  Problem Relation Age of Onset   Crohn's disease Sister    Colon cancer Neg Hx    He indicated that his mother is deceased. He indicated that his father is deceased. He indicated that the status of his sister is unknown. He indicated that his brother is deceased. He indicated that the status of his neg hx is unknown.  Social History    Social History   Socioeconomic History   Marital status: Divorced    Spouse name: Not on file   Number of children: Not on file   Years of education: Not on file   Highest education level: Not on file  Occupational History   Not on file  Tobacco Use   Smoking status: Former    Packs/day: 1.00    Years: 23.00    Additional pack years: 0.00    Total pack years: 23.00    Types: Cigarettes    Quit date: 02/25/1996    Years since quitting: 26.2   Smokeless tobacco: Never   Tobacco comments:    + 15 years of smoking  Vaping Use   Vaping Use: Never used  Substance and Sexual Activity   Alcohol use: No    Comment: Daily alcohol use 20+ years ago   Drug use: Not Currently    Types: Cocaine    Comment: History of intranasal cocaine and speed 20+ years ago.   Sexual activity: Not Currently    Birth control/protection: None  Other Topics Concern   Not on file  Social History Narrative   Not on file   Social Determinants of  Health   Financial Resource Strain: Not on file  Food Insecurity: No Food Insecurity (05/07/2022)   Hunger Vital Sign  Worried About Charity fundraiser in the Last Year: Never true    [place] in the Last Year: Never true  Transportation Needs: No Transportation Needs (05/07/2022)   PRAPARE - Hydrologist (Medical): No    Lack of Transportation (Non-Medical): No  Physical Activity: Not on file  Stress: Not on file  Social Connections: Not on file  Intimate Partner Violence: Not At Risk (05/07/2022)   Humiliation, Afraid, Rape, and Kick questionnaire    Fear of Current or Ex-Partner: No    Emotionally Abused: No    Physically Abused: No    Sexually Abused: No     Review of Systems    General:  No chills, fever, night sweats or weight changes.  Cardiovascular:  No chest pain, dyspnea on exertion, edema, orthopnea, palpitations, paroxysmal nocturnal dyspnea. Dermatological: No rash, lesions/masses Respiratory: No cough, dyspnea Urologic: No hematuria, dysuria Abdominal:   No nausea, vomiting, diarrhea, bright red blood per rectum, melena, or hematemesis Neurologic:  No visual changes, wkns, changes in mental status. All other systems reviewed and are otherwise negative except as noted above.  Physical Exam    VS:  BP 130/74   Pulse 66   Ht 5\' 8"  (1.727 m)   Wt 188 lb 12.8 oz (85.6 kg)   BMI 28.71 kg/m  , BMI Body mass index is 28.71 kg/m. GEN: Well nourished, well developed, in no acute distress. HEENT: normal. Neck: Supple, no JVD, carotid bruits, or masses. Cardiac: RRR, no murmurs, rubs, or gallops. No clubbing, cyanosis, edema.  Radials/DP/PT 2+ and equal bilaterally.  Respiratory:  Respirations regular and unlabored, clear to auscultation bilaterally. GI: Soft, nontender, nondistended, BS + x 4. MS: no deformity or atrophy. Skin: warm and dry, no rash. Neuro:  Strength and sensation are intact. Psych: Normal affect.  Accessory  Clinical Findings    Recent Labs: 05/06/2022: B Natriuretic Peptide 1,699.1; TSH 1.413 05/07/2022: ALT 19 05/08/2022: BUN 45; Creatinine, Ser 3.45; Hemoglobin 7.7; Platelets 77; Potassium 3.7; Sodium 141   Recent Lipid Panel    Component Value Date/Time   CHOL 191 08/02/2019 1515   TRIG 175 (H) 08/02/2019 1515   HDL 37 (L) 08/02/2019 1515   CHOLHDL 5.2 (H) 08/02/2019 1515   CHOLHDL 4.2 11/09/2014 0525   VLDL 23 11/09/2014 0525   LDLCALC 123 (H) 08/02/2019 1515   LDLDIRECT 125 (H) 08/02/2019 1515         ECG personally reviewed by me today-normal sinus rhythm nonspecific T wave abnormality 66 bpm- No acute changes   Echocardiogram 05/07/2022  IMPRESSIONS     1. Left ventricular ejection fraction, by estimation, is 35 to 40%. The  left ventricle has moderately decreased function. The left ventricle  demonstrates regional wall motion abnormalities (see scoring  diagram/findings for description). The left  ventricular internal cavity size was moderately to severely dilated. Left  ventricular diastolic parameters are indeterminate.   2. Right ventricular systolic function is mildly reduced. The right  ventricular size is normal. There is mildly elevated pulmonary artery  systolic pressure. The estimated right ventricular systolic pressure is  [id number] mmHg.   3. Left atrial size was severely dilated.   4. A small pericardial effusion is present. The pericardial effusion is  localized near the right atrium.   5. 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 There  is splay artifact. The mitral valve is grossly  normal. Severe mitral valve  regurgitation. No evidence of  mitral stenosis.   6. The aortic valve is tricuspid. There is mild calcification of the  aortic valve. Aortic valve regurgitation is mild to moderate. No aortic  stenosis is present.   7. The inferior vena cava is dilated in  size with >50% respiratory  variability, suggesting right atrial pressure of 8 mmHg.   FINDINGS   Left Ventricle: Left ventricular ejection fraction, by estimation, is 35  to 40%. The left ventricle has moderately decreased function. The left  ventricle demonstrates regional wall motion abnormalities. The left  ventricular internal cavity size was  moderately to severely dilated. There is no left ventricular hypertrophy.  Left ventricular diastolic function could not be evaluated due to mitral  regurgitation (moderate or greater). Left ventricular diastolic parameters  are indeterminate.     LV Wall Scoring:  The mid and distal lateral wall and mid anterolateral segment are  akinetic.  The entire anterior wall, basal anterolateral segment, and mid inferior  segment are hypokinetic.   Right Ventricle: The right ventricular size is normal. No increase in  right ventricular wall thickness. Right ventricular systolic function is  mildly reduced. There is mildly elevated pulmonary artery systolic  pressure. The tricuspid regurgitant velocity   is 2.88 m/s, and with an assumed right atrial pressure of 8 mmHg, the  estimated right ventricular systolic pressure is [id number] mmHg.   Left Atrium: Left atrial size was severely dilated.   Right Atrium: Right atrial size was normal in size.   Pericardium: A small pericardial effusion is present. The pericardial  effusion is localized near the right atrium.   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 There is splay artifact. The mitral valve is grossly  normal. Severe mitral valve regurgitation. No  evidence of mitral valve stenosis.   Tricuspid Valve: The tricuspid valve is normal in structure. Tricuspid  valve regurgitation is mild . No evidence of tricuspid stenosis.   Aortic Valve: The aortic valve is tricuspid. There is mild  calcification  of the aortic valve. Aortic valve regurgitation is mild to moderate. No  aortic stenosis is present.   Pulmonic Valve: The pulmonic valve was normal in structure. Pulmonic valve  regurgitation is mild. No evidence of pulmonic stenosis.   Aorta: The aortic root is normal in size and structure.   Venous: The inferior vena cava is dilated in size with greater than 50%  respiratory variability, suggesting right atrial pressure of 8 mmHg.   IAS/Shunts: No atrial level shunt detected by color flow Doppler.    Assessment & Plan   1.  Acute on chronic combined systolic CHF, severe mitral valve regurgitation-weight stable.  Euvolemic.  NYHA class II.  Reports compliance with his medications.  Denies side effects. TEE scheduled for 4/1.  Continue furosemide, Imdur, metoprolol Heart healthy low-sodium diet-salty 6 given Increase physical activity as tolerated Daily weights-contact office with a weight increase of 2 to 3 pounds overnight or 5 pounds in 1 week Lower extremities when not active  Coronary artery disease-no chest pain today.  Late presenting MI 1/13.  He was offered cardiac catheterization but deferred.  He underwent subsequent stress testing 1/13 which showed large transmural infarct in the left circumflex territory without significant reversible ischemia EF was 40 to 45% with moderate MR. Continue metoprolol, rosuvastatin, Imdur, sublingual nitroglycerin as needed  Essential hypertension-BP today 130/74. Continue  metoprolol Imdur, Heart healthy low-sodium diet-salty 6 given Increase physical activity as tolerated  Elevated troponins-denies chest pain.  Elevated in the setting of demand ischemia. No plans for ischemic evaluation  Iron deficiency anemia-chronic.  Hemoglobin during admission noted to be between 7.5 and 8.  Received IV iron during admission. Follows with PCP  History of DVT-09/2014. On apixaban Follows with PCP  Disposition: Follow-up with Dr.  Fletcher Anon or [facility] in 4 months.   Jossie Ng. Aryn Kops NP-C     05/15/2022, 10:44 AM [place] [address] Office [phone] Fax [phone]    I spent 14 minutes examining this patient, reviewing medications, and using patient centered shared decision making involving her cardiac care.  Prior to her visit I spent greater than 20 minutes reviewing her past medical history,  medications, and prior cardiac tests.

## 2022-05-13 NOTE — H&P (View-Only) (Signed)
Cardiology Clinic Note   Patient Name: Glenn Hawkins Date of Encounter: 05/15/2022  Primary Care Provider:  Glenda Chroman, MD Primary Cardiologist:  Kathlyn Sacramento, MD  Patient Profile    Glenn Hawkins 67 year old male present to the clinic today for follow-up evaluation of his coronary artery disease and ischemic cardiomyopathy.  Past Medical History    Past Medical History:  Diagnosis Date   Aortic insufficiency    a. 08/2019 Echo: mild to mod AI.   C. difficile diarrhea 07/2020   CKD (chronic kidney disease) stage 3, GFR 30-59 ml/min (HCC)    baseline Cr around 2.5   Combined systolic and diastolic congestive heart failure ([place]) 05/06/2022   Coronary atherosclerosis of native coronary artery    a. 02/2011 Late presentation MI-->Myoview w/ large area of transmural infarct in LCX distribution, no ischemia.   Depressive disorder, not elsewhere classified    Esophageal reflux    Gout, unspecified    Hemorrhoids    a. 01/2016 s/p hemorrhoidectomy.   HFrEF (heart failure with reduced ejection fraction) ([place])    a. 04/2012 Echo: EF 40%, inflat AK, Gr1 DD, mild AI/MR, triv TR; b. 11/2017 EchoP EF 30-35%, inflat HK; c. 08/2019 Echo: EF 30-35%, gr1 DD, inflat AK.   History of DVT (deep vein thrombosis)    a. Chronic Eliquis.   Hyperlipidemia LDL goal <70    Hypertension    Hypotension, unspecified    Ischemic cardiomyopathy    a. 04/2012 Echo: EF 40%, inflat AK, Gr1 DD, mild AI/MR, triv TR; b. 11/2017 EchoP EF 30-35%, inflat HK; c. 08/2019 Echo: EF 30-35%, gr1 DD, inflat AK. Nl RV size/fxn. Mild MR. Mild to mod AI.   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 04/2012 Echo: EF 40%, mild MR.   Myocardial infarction (lateral wall) ([place]) 2013   a. 02/2011 - late presentation. Managed conservatively 2/2 CKD;  b. 02/2011 Myoview: Large transmural infarct in the LCX distribution, no ischemia.   PAD (peripheral artery disease) ([place])    a. 08/2015 ABI/duplex: R: 0.86, L 0.96. Duplex w/ bilateral  heterogeneous plaque in mid fem arteries, no significant stenoses; b. 08/2019 ABI/Duplex: prob bilat inflow dzs w/ stable ABIs (R 0.85, L 0.93).   Personal history of tobacco use, presenting hazards to health    Torn ligament    Unspecified schizophrenia, unspecified condition    Past Surgical History:  Procedure Laterality Date   BIOPSY  12/17/2020   Procedure: BIOPSY;  Surgeon: Eloise Harman, DO;  [facility];  Service: Endoscopy;;   COLONOSCOPY     COLONOSCOPY N/A 11/10/2014   Procedure: COLONOSCOPY;  Surgeon: Manus Gunning, MD;  Location: [facility];  Service: Gastroenterology;  Laterality: N/A;   COLONOSCOPY WITH PROPOFOL N/A 05/08/2015   Surgeon: [name], MD; nonthrombosed external hemorrhoids, one 8 mm tubular adenoma, one 4 mm tubular adenoma.  Repeat colonoscopy in 5-10 years.   COLONOSCOPY WITH PROPOFOL N/A 12/17/2020   Procedure: COLONOSCOPY WITH PROPOFOL;  Surgeon: Eloise Harman, DO;  [facility];  Service: Endoscopy;  Laterality: N/A;  8:30am   ESOPHAGOGASTRODUODENOSCOPY (EGD) WITH PROPOFOL N/A 05/08/2015   Surgeon: [name], MD; LA grade a reflux esophagitis, normal stomach and duodenum.   ESOPHAGOGASTRODUODENOSCOPY (EGD) WITH PROPOFOL N/A 12/17/2020   Procedure: ESOPHAGOGASTRODUODENOSCOPY (EGD) WITH PROPOFOL;  Surgeon: Eloise Harman, DO;  [facility];  Service: Endoscopy;  Laterality: N/A;   EYE SURGERY Right    removal  of foreign body   HEMORRHOID SURGERY N/A 02/20/2016   Procedure: EXTENSIVE HEMORRHOIDECTOMY;  Surgeon: [name], MD;  Location: [facility];  Service: General;  Laterality: N/A;   None     POLYPECTOMY  05/08/2015   Procedure: POLYPECTOMY;  Surgeon: [name], MD;  [facility];  Service: Endoscopy;;  transverse colon polyp    Allergies  Allergies  Allergen Reactions   Nifedipine Diarrhea   Rabeprazole Other (See Comments)    unknown   Benazepril Other (See Comments)     unknown   Haloperidol Anxiety    Causes severe anxiety   Omeprazole Other (See Comments)    unknown    History of Present Illness    Glenn Hawkins has a PMH of coronary artery disease, HTN, ischemic cardiomyopathy, chronic systolic CHF, GERD, CKD stage III, obesity, hyperlipidemia, iron deficiency anemia, chronic anticoagulation, and paranoid schizophrenia.  Echocardiogram 7/21 showed EF of 30-35% with G1 DD.    Is admitted to the hospital on 05/07/2022 and discharged on 05/09/2022.  He presented to the ED with shortness of breath.  He had been hospitalized 04/27/2022 until 04/30/2022 around the hospital with hypoxia from pneumonia.  He reported still being somewhat short of breath since discharge.  The emergency department he was hypertensive, hypoxic at 88% on room air.  His hemoglobin was noted to be 9.5.  His creatinine was 3.2.  His troponins are 55 and 58.  His chest x-ray showed cardiomegaly, pulmonary vascular congestion, and concern for edema versus infiltrates.  He was noted to have small bilateral pleural effusion.  He received IV diuresis and his volume status improved.  He was weaned off of O2.  His repeat echocardiogram showed improved EF of 35-40% with severe mitral valve regurgitation.  He was transitioned to p.o. furosemide.  His low-dose beta-blocker was resumed as well as isosorbide mononitrate.  His GDMT was limited due to his CKD.  Follow-up TEE in 2 weeks with structural heart was planned.  It was felt that his troponins were elevated due to demand ischemia.  He received IV iron for his hemoglobin of 7.5-8 which was chronic.  He presents to the clinic today for follow-up evaluation and states he feels so-so.  He reports compliance with his apixaban and denies bleeding issues.  He is fairly sedentary at home.  We reviewed his recent hospitalizations.  He and his wife expressed understanding.  We reviewed his upcoming TEE.  He continues to follow a low-sodium diet.  I asked him to  continue to increase his physical activity as tolerated.  We will continue his current medication regimen and plan follow-up in 4 months.  Today he denies chest pain, shortness of breath, lower extremity edema, fatigue, palpitations, melena, hematuria, hemoptysis, diaphoresis, weakness, presyncope, syncope, orthopnea, and PND.    Home Medications    Prior to Admission medications   Medication Sig Start Date End Date Taking? Authorizing Provider  allopurinol (ZYLOPRIM) 100 MG tablet Take 1 tablet (100 mg total) by mouth daily. 05/09/22   Domenic Polite, MD  apixaban (ELIQUIS) 2.5 MG TABS tablet Take 2.5 mg by mouth 2 (two) times daily.    [provider]  brimonidine (ALPHAGAN) 0.2 % ophthalmic solution Place 1 drop into both eyes 3 (three) times daily. 04/18/22   [provider]  Daridorexant HCl 25 MG TABS Take 25 mg by mouth at bedtime.    [provider]  esomeprazole (NEXIUM) 20 MG capsule Take 1 capsule (20 mg total)  by mouth daily before breakfast. Patient not taking: Reported on 05/07/2022 11/08/20   Erenest Rasher, PA-C  furosemide (LASIX) 40 MG tablet Take 1 tablet (40 mg total) by mouth daily. 05/09/22   Domenic Polite, MD  isosorbide mononitrate (IMDUR) 30 MG 24 hr tablet Take 1 tablet (30 mg total) by mouth every morning. Patient needs appointment for further refills. 1 st attempt 05/09/22   Domenic Polite, MD  latanoprost (XALATAN) 0.005 % ophthalmic solution Place 1 drop into both eyes at bedtime. 10/11/20   [provider]  melatonin 3 MG TABS tablet Take 3 mg by mouth at bedtime. 04/16/22   [provider]  metoprolol tartrate (LOPRESSOR) 100 MG tablet Take 100 mg by mouth 2 (two) times daily. 10/11/20   [provider]  nitroGLYCERIN (NITROSTAT) 0.4 MG SL tablet Place 1 tablet (0.4 mg total) under the tongue every 5 (five) minutes as needed for chest pain. Patient not taking: Reported on 12/11/2020 03/21/16   Wellington Hampshire,  MD  OLANZapine (ZYPREXA) 15 MG tablet Take 15 mg by mouth at bedtime. 03/24/22   [provider]  OLANZapine (ZYPREXA) 5 MG tablet Take 5 mg by mouth daily.    [provider]  risperiDONE (RISPERDAL) 1 MG tablet Take 1 tablet (1 mg total) by mouth at bedtime. 05/09/22   Domenic Polite, MD  rosuvastatin (CRESTOR) 40 MG tablet Take 20 mg by mouth daily.    [provider]  sertraline (ZOLOFT) 100 MG tablet Take 100 mg by mouth daily.    [provider]  sodium bicarbonate 650 MG tablet Take 650 mg by mouth 2 (two) times daily.    [provider]    Family History    Family History  Problem Relation Age of Onset   Crohn's disease Sister    Colon cancer Neg Hx    He indicated that his mother is deceased. He indicated that his father is deceased. He indicated that the status of his sister is unknown. He indicated that his brother is deceased. He indicated that the status of his neg hx is unknown.  Social History    Social History   Socioeconomic History   Marital status: Divorced    Spouse name: Not on file   Number of children: Not on file   Years of education: Not on file   Highest education level: Not on file  Occupational History   Not on file  Tobacco Use   Smoking status: Former    Packs/day: 1.00    Years: 23.00    Additional pack years: 0.00    Total pack years: 23.00    Types: Cigarettes    Quit date: 02/25/1996    Years since quitting: 26.2   Smokeless tobacco: Never   Tobacco comments:    + 15 years of smoking  Vaping Use   Vaping Use: Never used  Substance and Sexual Activity   Alcohol use: No    Comment: Daily alcohol use 20+ years ago   Drug use: Not Currently    Types: Cocaine    Comment: History of intranasal cocaine and speed 20+ years ago.   Sexual activity: Not Currently    Birth control/protection: None  Other Topics Concern   Not on file  Social History Narrative   Not on file   Social Determinants of  Health   Financial Resource Strain: Not on file  Food Insecurity: No Food Insecurity (05/07/2022)   Hunger Vital Sign  Worried About Charity fundraiser in the Last Year: Never true    [place] in the Last Year: Never true  Transportation Needs: No Transportation Needs (05/07/2022)   PRAPARE - Hydrologist (Medical): No    Lack of Transportation (Non-Medical): No  Physical Activity: Not on file  Stress: Not on file  Social Connections: Not on file  Intimate Partner Violence: Not At Risk (05/07/2022)   Humiliation, Afraid, Rape, and Kick questionnaire    Fear of Current or Ex-Partner: No    Emotionally Abused: No    Physically Abused: No    Sexually Abused: No     Review of Systems    General:  No chills, fever, night sweats or weight changes.  Cardiovascular:  No chest pain, dyspnea on exertion, edema, orthopnea, palpitations, paroxysmal nocturnal dyspnea. Dermatological: No rash, lesions/masses Respiratory: No cough, dyspnea Urologic: No hematuria, dysuria Abdominal:   No nausea, vomiting, diarrhea, bright red blood per rectum, melena, or hematemesis Neurologic:  No visual changes, wkns, changes in mental status. All other systems reviewed and are otherwise negative except as noted above.  Physical Exam    VS:  BP 130/74   Pulse 66   Ht 5\' 8"  (1.727 m)   Wt 188 lb 12.8 oz (85.6 kg)   BMI 28.71 kg/m  , BMI Body mass index is 28.71 kg/m. GEN: Well nourished, well developed, in no acute distress. HEENT: normal. Neck: Supple, no JVD, carotid bruits, or masses. Cardiac: RRR, no murmurs, rubs, or gallops. No clubbing, cyanosis, edema.  Radials/DP/PT 2+ and equal bilaterally.  Respiratory:  Respirations regular and unlabored, clear to auscultation bilaterally. GI: Soft, nontender, nondistended, BS + x 4. MS: no deformity or atrophy. Skin: warm and dry, no rash. Neuro:  Strength and sensation are intact. Psych: Normal affect.  Accessory  Clinical Findings    Recent Labs: 05/06/2022: B Natriuretic Peptide 1,699.1; TSH 1.413 05/07/2022: ALT 19 05/08/2022: BUN 45; Creatinine, Ser 3.45; Hemoglobin 7.7; Platelets 77; Potassium 3.7; Sodium 141   Recent Lipid Panel    Component Value Date/Time   CHOL 191 08/02/2019 1515   TRIG 175 (H) 08/02/2019 1515   HDL 37 (L) 08/02/2019 1515   CHOLHDL 5.2 (H) 08/02/2019 1515   CHOLHDL 4.2 11/09/2014 0525   VLDL 23 11/09/2014 0525   LDLCALC 123 (H) 08/02/2019 1515   LDLDIRECT 125 (H) 08/02/2019 1515         ECG personally reviewed by me today-normal sinus rhythm nonspecific T wave abnormality 66 bpm- No acute changes   Echocardiogram 05/07/2022  IMPRESSIONS     1. Left ventricular ejection fraction, by estimation, is 35 to 40%. The  left ventricle has moderately decreased function. The left ventricle  demonstrates regional wall motion abnormalities (see scoring  diagram/findings for description). The left  ventricular internal cavity size was moderately to severely dilated. Left  ventricular diastolic parameters are indeterminate.   2. Right ventricular systolic function is mildly reduced. The right  ventricular size is normal. There is mildly elevated pulmonary artery  systolic pressure. The estimated right ventricular systolic pressure is  [id number] mmHg.   3. Left atrial size was severely dilated.   4. A small pericardial effusion is present. The pericardial effusion is  localized near the right atrium.   5. 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 There  is splay artifact. The mitral valve is grossly  normal. Severe mitral valve  regurgitation. No evidence of  mitral stenosis.   6. The aortic valve is tricuspid. There is mild calcification of the  aortic valve. Aortic valve regurgitation is mild to moderate. No aortic  stenosis is present.   7. The inferior vena cava is dilated in  size with >50% respiratory  variability, suggesting right atrial pressure of 8 mmHg.   FINDINGS   Left Ventricle: Left ventricular ejection fraction, by estimation, is 35  to 40%. The left ventricle has moderately decreased function. The left  ventricle demonstrates regional wall motion abnormalities. The left  ventricular internal cavity size was  moderately to severely dilated. There is no left ventricular hypertrophy.  Left ventricular diastolic function could not be evaluated due to mitral  regurgitation (moderate or greater). Left ventricular diastolic parameters  are indeterminate.     LV Wall Scoring:  The mid and distal lateral wall and mid anterolateral segment are  akinetic.  The entire anterior wall, basal anterolateral segment, and mid inferior  segment are hypokinetic.   Right Ventricle: The right ventricular size is normal. No increase in  right ventricular wall thickness. Right ventricular systolic function is  mildly reduced. There is mildly elevated pulmonary artery systolic  pressure. The tricuspid regurgitant velocity   is 2.88 m/s, and with an assumed right atrial pressure of 8 mmHg, the  estimated right ventricular systolic pressure is [id number] mmHg.   Left Atrium: Left atrial size was severely dilated.   Right Atrium: Right atrial size was normal in size.   Pericardium: A small pericardial effusion is present. The pericardial  effusion is localized near the right atrium.   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 There is splay artifact. The mitral valve is grossly  normal. Severe mitral valve regurgitation. No  evidence of mitral valve stenosis.   Tricuspid Valve: The tricuspid valve is normal in structure. Tricuspid  valve regurgitation is mild . No evidence of tricuspid stenosis.   Aortic Valve: The aortic valve is tricuspid. There is mild  calcification  of the aortic valve. Aortic valve regurgitation is mild to moderate. No  aortic stenosis is present.   Pulmonic Valve: The pulmonic valve was normal in structure. Pulmonic valve  regurgitation is mild. No evidence of pulmonic stenosis.   Aorta: The aortic root is normal in size and structure.   Venous: The inferior vena cava is dilated in size with greater than 50%  respiratory variability, suggesting right atrial pressure of 8 mmHg.   IAS/Shunts: No atrial level shunt detected by color flow Doppler.    Assessment & Plan   1.  Acute on chronic combined systolic CHF, severe mitral valve regurgitation-weight stable.  Euvolemic.  NYHA class II.  Reports compliance with his medications.  Denies side effects. TEE scheduled for 4/1.  Continue furosemide, Imdur, metoprolol Heart healthy low-sodium diet-salty 6 given Increase physical activity as tolerated Daily weights-contact office with a weight increase of 2 to 3 pounds overnight or 5 pounds in 1 week Lower extremities when not active  Coronary artery disease-no chest pain today.  Late presenting MI 1/13.  He was offered cardiac catheterization but deferred.  He underwent subsequent stress testing 1/13 which showed large transmural infarct in the left circumflex territory without significant reversible ischemia EF was 40 to 45% with moderate MR. Continue metoprolol, rosuvastatin, Imdur, sublingual nitroglycerin as needed  Essential hypertension-BP today 130/74. Continue  metoprolol Imdur, Heart healthy low-sodium diet-salty 6 given Increase physical activity as tolerated  Elevated troponins-denies chest pain.  Elevated in the setting of demand ischemia. No plans for ischemic evaluation  Iron deficiency anemia-chronic.  Hemoglobin during admission noted to be between 7.5 and 8.  Received IV iron during admission. Follows with PCP  History of DVT-09/2014. On apixaban Follows with PCP  Disposition: Follow-up with Dr.  Fletcher Anon or [facility] in 4 months.   Jossie Ng. Shantell Belongia NP-C     05/15/2022, 10:44 AM [place] [address] Office [phone] Fax [phone]    I spent 14 minutes examining this patient, reviewing medications, and using patient centered shared decision making involving her cardiac care.  Prior to her visit I spent greater than 20 minutes reviewing her past medical history,  medications, and prior cardiac tests.

## 2022-05-15 ENCOUNTER — Encounter: Payer: Self-pay | Admitting: General Practice

## 2022-05-15 ENCOUNTER — Ambulatory Visit: Payer: Medicare Other | Attending: General Practice | Admitting: General Practice

## 2022-05-15 VITALS — BP 130/74 | HR 66 | Ht 68.0 in | Wt 188.8 lb

## 2022-05-15 DIAGNOSIS — I340 Nonrheumatic mitral (valve) insufficiency: Secondary | ICD-10-CM | POA: Diagnosis not present

## 2022-05-15 DIAGNOSIS — I5022 Chronic systolic (congestive) heart failure: Secondary | ICD-10-CM | POA: Insufficient documentation

## 2022-05-15 DIAGNOSIS — I2510 Atherosclerotic heart disease of native coronary artery without angina pectoris: Secondary | ICD-10-CM | POA: Insufficient documentation

## 2022-05-15 DIAGNOSIS — D508 Other iron deficiency anemias: Secondary | ICD-10-CM | POA: Diagnosis not present

## 2022-05-15 DIAGNOSIS — I10 Essential (primary) hypertension: Secondary | ICD-10-CM | POA: Diagnosis not present

## 2022-05-15 DIAGNOSIS — Z86718 Personal history of other venous thrombosis and embolism: Secondary | ICD-10-CM | POA: Diagnosis not present

## 2022-05-15 NOTE — Patient Instructions (Signed)
Medication Instructions:  You may hold your furosemide *If you need a refill on your cardiac medications before your next appointment, please call your pharmacy*  Lab Work: NONE If you have labs (blood work) drawn today and your tests are completely normal, you will receive your results only by:  [place] (if you have MyChart) OR A paper copy in the mail If you have any lab test that is abnormal or we need to change your treatment, we will call you to review the results.  Testing/Procedures: TEE-SEE BELOW  Follow-Up: At [facility], you and your health needs are our priority.  As part of our continuing mission to provide you with exceptional heart care, we have created designated Provider Care Teams.  These Care Teams include your primary Cardiologist (physician) and Advanced Practice Providers (APPs -  Physician Assistants and Nurse Practitioners) who all work together to provide you with the care you need, when you need it.  We recommend signing up for the patient portal called "MyChart".  Sign up information is provided on this After Visit Summary.  MyChart is used to connect with patients for Virtual Visits (Telemedicine).  Patients are able to view lab/test results, encounter notes, upcoming appointments, etc.  Non-urgent messages can be sent to your provider as well.   To learn more about what you can do with MyChart, go to [email or web address].    Your next appointment:   4 month(s)  Provider:   You may see Kathlyn Sacramento, MD or one of the following Advanced Practice Providers on your designated Care Team:  Murray Hodgkins, NP  Christell Faith, PA-C  Cadence Kathlen Mody, PA-C or Gerrie Nordmann, NP    Other Instructions       You are scheduled for a TEE (Transesophageal Echocardiogram) on Monday, April 1 with Dr. Johnsie Cancel.  Please arrive at the [facility] (Main Entrance A) at [facility]: [address] [place], [place] [address] at 9:30 AM.    DIET:  Nothing to  eat or drink after midnight except a sip of water with medications (see medication instructions below)  MEDICATION INSTRUCTIONS: YOU MAY HOLD YOUR FUROSEMIDE. Continue taking your anticoagulant (blood thinner): Apixaban (Eliquis).  You will need to continue this after your procedure until you are told by your provider that it is safe to stop.    LABS:  NONE NEEDED  FYI:  For your safety, and to allow [place] to monitor your vital signs accurately during the surgery/procedure we request: If you have artificial nails, gel coating, SNS etc, please have those removed prior to your surgery/procedure. Not having the nail coverings /polish removed may result in cancellation or delay of your surgery/procedure.  You must have a responsible person to drive you home and stay in the waiting area during your procedure. Failure to do so could result in cancellation.  Bring your insurance cards.  *Special Note: Every effort is made to have your procedure done on time. Occasionally there are emergencies that occur at the hospital that may cause delays. Please be patient if a delay does occur.

## 2022-05-16 ENCOUNTER — Encounter: Payer: Self-pay | Admitting: Internal Medicine

## 2022-05-16 ENCOUNTER — Ambulatory Visit (INDEPENDENT_AMBULATORY_CARE_PROVIDER_SITE_OTHER): Payer: Medicare Other | Admitting: Internal Medicine

## 2022-05-16 VITALS — BP 118/78 | HR 67 | Temp 97.6°F | Ht 68.0 in | Wt 186.6 lb

## 2022-05-16 DIAGNOSIS — I340 Nonrheumatic mitral (valve) insufficiency: Secondary | ICD-10-CM | POA: Diagnosis not present

## 2022-05-16 DIAGNOSIS — Z299 Encounter for prophylactic measures, unspecified: Secondary | ICD-10-CM | POA: Diagnosis not present

## 2022-05-16 DIAGNOSIS — Z09 Encounter for follow-up examination after completed treatment for conditions other than malignant neoplasm: Secondary | ICD-10-CM | POA: Diagnosis not present

## 2022-05-16 DIAGNOSIS — I10 Essential (primary) hypertension: Secondary | ICD-10-CM | POA: Diagnosis not present

## 2022-05-16 DIAGNOSIS — I5022 Chronic systolic (congestive) heart failure: Secondary | ICD-10-CM | POA: Diagnosis not present

## 2022-05-16 DIAGNOSIS — R0609 Other forms of dyspnea: Secondary | ICD-10-CM | POA: Diagnosis not present

## 2022-05-16 NOTE — Assessment & Plan Note (Addendum)
Quit smoking 1998 -  onset of doe 2016 "with first heart troubles"  but worse since end of 2023 with combined chf and cri - Echo 04/27/22   1. LVEF  35 to 40%. LV moderately decreased function.  The left  ventricular internal cavity size was moderately to severely dilated. Left  ventricular diastolic parameters are indeterminate.   2. Right ventricular systolic function is mildly reduced. The right  ventricular size is normal. There is mildly elevated pulmonary artery  systolic pressure. The estimated right ventricular systolic pressure is  [id number] mmHg.   3. Left atrial size was severely dilated.   4. A small pericardial effusion is present. The pericardial effusion is  localized near the right atrium.   5. 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 There  is splay artifact. The mitral valve is grossly normal. Severe mitral valve  regurgitation. No evidence of  mitral stenosis.   6. The aortic valve is tricuspid. There is mild calcification of the  aortic valve. Aortic valve regurgitation is mild to moderate. No aortic  stenosis is present.   7. The inferior vena cava is dilated in size with >50% respiratory  variability, suggesting right atrial pressure of 8 mmHg.  - 05/16/2022   Walked on RA  x  3  lap(s) =  approx 525  ft  @ rapid pace, stopped due to end of study   with lowest 02 sats 95%  with mild sob at end / no cp or claudication      When respiratory symptoms begin or become refractory well after a patient reports complete smoking cessation,  Especially when this wasn't the case while they were smoking, a red flag is raised based on the work of Dr Kris Mouton which states:  if you quit smoking when your best day FEV1 is still well preserved (which is likely the case here)  it is highly unlikely you will progress to severe disease.  That is to say, once the smoking stops,  the symptoms should  not suddenly erupt or markedly worsen.  If so, the differential diagnosis should include  obesity/deconditioning,  LPR/Reflux/Aspiration syndromes,  occult CHF,  or especially side effect of medications commonly used in this population.    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   No need for pulmonary rx/ 02 at this point so f/u in [facility] office in 6-8 weeks and just monitor sats with exertion in  meantime.  Each maintenance medication was reviewed in detail including emphasizing most importantly the difference between maintenance and prns and under what circumstances the prns are to be triggered using an action plan format where appropriate.  Total time for H and P, chart review, counseling,  directly observing portions of ambulatory 02 saturation study/ and generating customized AVS unique to this office visit / same day charting =   45 min new pt eval

## 2022-05-16 NOTE — Progress Notes (Signed)
Glenn Hawkins, male    DOB: [birth year]   MRN: [id number]   Brief patient profile:  66  yobm  quit smoking 1998 s resp cc with onset sob around 2016  referred to pulmonary clinic in [facility]  05/16/2022 by Ripley Fraise NP for R effusion assoc with breathing bad to worse toward end 2023      Admitted [facility] then [facility] x 2 weeks March 2024   History of Present Illness  05/16/2022  Pulmonary/ 1st office eval/ [name] / [facility]  Chief Complaint  Patient presents with   Consult    ED for Pleural Effusion. SOB with exertion. Some wheezing. Cough with white sputum.  Dyspnea:  hc parking, now doe slow pace MMRC3 = can't walk 100 yards even at a slow pace at a flat grade s stopping due to sob   Cough: none  Sleep: flat bed 2 pillows  SABA use: none   No obvious day to day or daytime pattern/variability or assoc excess/ purulent sputum or mucus plugs or hemoptysis or cp or chest tightness, subjective wheeze or overt sinus or hb symptoms.   Sleeping now  without nocturnal  or early am exacerbation  of respiratory  c/o's or need for noct saba. Also denies any obvious fluctuation of symptoms with weather or environmental changes or other aggravating or alleviating factors except as outlined above   No unusual exposure hx or h/o childhood pna/ asthma or knowledge of premature birth.  Current Allergies, Complete Past Medical History, Past Surgical History, Family History, and Social History were reviewed in [employer] record.  ROS  The following are not active complaints unless bolded Hoarseness, sore throat, dysphagia, dental problems, itching, sneezing,  nasal congestion or discharge of excess mucus or purulent secretions, ear ache,   fever, chills, sweats, unintended wt loss or wt gain, classically pleuritic or exertional cp,  orthopnea pnd or arm/hand swelling  or leg swelling, presyncope, palpitations, abdominal pain, anorexia, nausea, vomiting, diarrhea  or change  in bowel habits or change in bladder habits, change in stools or change in urine, dysuria, hematuria,  rash, arthralgias, visual complaints, headache, numbness, weakness or ataxia or problems with walking or coordination,  change in mood or  memory. Claudication both legs           Past Medical History:  Diagnosis Date   Aortic insufficiency    a. 08/2019 Echo: mild to mod AI.   C. difficile diarrhea 07/2020   CKD (chronic kidney disease) stage 3, GFR 30-59 ml/min (HCC)    baseline Cr around 2.5   Combined systolic and diastolic congestive heart failure ([place]) 05/06/2022   Coronary atherosclerosis of native coronary artery    a. 02/2011 Late presentation MI-->Myoview w/ large area of transmural infarct in LCX distribution, no ischemia.   Depressive disorder, not elsewhere classified    Esophageal reflux    Gout, unspecified    Hemorrhoids    a. 01/2016 s/p hemorrhoidectomy.   HFrEF (heart failure with reduced ejection fraction) ([place])    a. 04/2012 Echo: EF 40%, inflat AK, Gr1 DD, mild AI/MR, triv TR; b. 11/2017 EchoP EF 30-35%, inflat HK; c. 08/2019 Echo: EF 30-35%, gr1 DD, inflat AK.   History of DVT (deep vein thrombosis)    a. Chronic Eliquis.   Hyperlipidemia LDL goal <70    Hypertension    Hypotension, unspecified    Ischemic cardiomyopathy    a. 04/2012 Echo: EF 40%, inflat AK, Gr1 DD,  mild AI/MR, triv TR; b. 11/2017 EchoP EF 30-35%, inflat HK; c. 08/2019 Echo: EF 30-35%, gr1 DD, inflat AK. Nl RV size/fxn. Mild MR. Mild to mod AI.   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 04/2012 Echo: EF 40%, mild MR.   Myocardial infarction (lateral wall) ([place]) 2013   a. 02/2011 - late presentation. Managed conservatively 2/2 CKD;  b. 02/2011 Myoview: Large transmural infarct in the LCX distribution, no ischemia.   PAD (peripheral artery disease) ([place])    a. 08/2015 ABI/duplex: R: 0.86, L 0.96. Duplex w/ bilateral heterogeneous plaque in mid fem arteries, no significant stenoses; b. 08/2019 ABI/Duplex: prob  bilat inflow dzs w/ stable ABIs (R 0.85, L 0.93).   Personal history of tobacco use, presenting hazards to health    Torn ligament    Unspecified schizophrenia, unspecified condition     Outpatient Medications Prior to Visit  Medication Sig Dispense Refill   apixaban (ELIQUIS) 2.5 MG TABS tablet Take 2.5 mg by mouth 2 (two) times daily.     brimonidine (ALPHAGAN) 0.2 % ophthalmic solution Place 1 drop into both eyes 3 (three) times daily.     Daridorexant HCl 25 MG TABS Take 25 mg by mouth at bedtime.     esomeprazole (NEXIUM) 20 MG capsule Take 1 capsule (20 mg total) by mouth daily before breakfast. 30 capsule 3   furosemide (LASIX) 40 MG tablet Take 1 tablet (40 mg total) by mouth daily. 30 tablet 0   isosorbide mononitrate (IMDUR) 30 MG 24 hr tablet Take 1 tablet (30 mg total) by mouth every morning. Patient needs appointment for further refills. 1 st attempt 30 tablet 0   latanoprost (XALATAN) 0.005 % ophthalmic solution Place 1 drop into both eyes at bedtime.     melatonin 3 MG TABS tablet Take 3 mg by mouth at bedtime.     metoprolol tartrate (LOPRESSOR) 100 MG tablet Take 100 mg by mouth 2 (two) times daily.     nitroGLYCERIN (NITROSTAT) 0.4 MG SL tablet Place 1 tablet (0.4 mg total) under the tongue every 5 (five) minutes as needed for chest pain. 25 tablet 3   OLANZapine (ZYPREXA) 15 MG tablet Take 15 mg by mouth at bedtime.     OLANZapine (ZYPREXA) 5 MG tablet Take 5 mg by mouth daily.     risperiDONE (RISPERDAL) 1 MG tablet Take 1 tablet (1 mg total) by mouth at bedtime. 30 tablet 0   rosuvastatin (CRESTOR) 40 MG tablet Take 20 mg by mouth daily.     sertraline (ZOLOFT) 100 MG tablet Take 100 mg by mouth daily.     sodium bicarbonate 650 MG tablet Take 650 mg by mouth 2 (two) times daily.     allopurinol (ZYLOPRIM) 100 MG tablet Take 1 tablet (100 mg total) by mouth daily. (Patient not taking: Reported on 05/16/2022) 30 tablet 0   No facility-administered medications prior to  visit.     Objective:     BP 118/78 (BP Location: Left Arm, Cuff Size: Normal)   Pulse 67   Temp 97.6 F (36.4 C)   Ht 5\' 8"  (1.727 m)   Wt 186 lb 9.6 oz (84.6 kg)   SpO2 99%   BMI 28.37 kg/m   SpO2: 99 %    Pleasant amb bm nad   HEENT : Oropharynx  clear/ full denturs          NECK :  without  apparent JVD/ palpable Nodes/TM    LUNGS: no acc muscle use,  Nl contour chest which is clear to A and P bilaterally without cough on insp or exp maneuvers   CV:  RRR  no s3  2/6 SEM/ 1/6 Diastolic M s increase in P2, and no edema   ABD:  soft and nontender with nl inspiratory excursion in the supine position. No bruits or organomegaly appreciated   MS:  Nl gait/ ext warm without deformities Or obvious joint restrictions  calf tenderness, cyanosis or clubbing    SKIN: warm and dry without lesions    NEURO:  alert, approp, nl sensorium with  no motor or cerebellar deficits apparent.   Mild pot belly, min rhonchi     I personally reviewed images and agree with radiology impression as follows:  CXR:   pa and lateral  05/06/22 1. Cardiomegaly with pulmonary vascular congestion. 2. Interstitial and airspace opacities in the lungs bilaterally, possible edema or infiltrate, and improved from the prior exam. 3. Small bilateral pleural effusions.      Assessment   DOE (dyspnea on exertion) Quit smoking 1998 -  onset of doe 2016 "with first heart troubles"  but worse since end of 2023 with combined chf and cri - Echo 04/27/22   1. LVEF  35 to 40%. LV moderately decreased function.  The left  ventricular internal cavity size was moderately to severely dilated. Left  ventricular diastolic parameters are indeterminate.   2. Right ventricular systolic function is mildly reduced. The right  ventricular size is normal. There is mildly elevated pulmonary artery  systolic pressure. The estimated right ventricular systolic pressure is  [id number] mmHg.   3. Left atrial size was severely  dilated.   4. A small pericardial effusion is present. The pericardial effusion is  localized near the right atrium.   5. 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 There  is splay artifact. The mitral valve is grossly normal. Severe mitral valve  regurgitation. No evidence of  mitral stenosis.   6. The aortic valve is tricuspid. There is mild calcification of the  aortic valve. Aortic valve regurgitation is mild to moderate. No aortic  stenosis is present.   7. The inferior vena cava is dilated in size with >50% respiratory  variability, suggesting right atrial pressure of 8 mmHg.  - 05/16/2022   Walked on RA  x  3  lap(s) =  approx 525  ft  @ rapid pace, stopped due to end of study   with lowest 02 sats 95%  with mild sob at end / no cp or claudication      When respiratory symptoms begin or become refractory well after a patient reports complete smoking cessation,  Especially when this wasn't the case while they were smoking, a red flag is raised based on the work of Dr Kris Mouton which states:  if you quit smoking when your best day FEV1 is still well preserved (which is likely the case here)  it is highly unlikely you will progress to severe disease.  That is to say, once the smoking stops,  the symptoms should not suddenly erupt or markedly worsen.  If so, the differential diagnosis should include  obesity/deconditioning,  LPR/Reflux/Aspiration syndromes,  occult CHF,  or especially side effect of medications commonly used in this population.    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   No need for  pulmonary rx/ 02 at this point so f/u in [facility] office in 6-8 weeks and just monitor sats with exertion in  meantime.  Each maintenance medication was reviewed in detail including emphasizing most importantly the difference between maintenance and prns and under what circumstances the prns are to be triggered using an action plan format where appropriate.  Total time for H and P, chart review, counseling,  directly observing portions of ambulatory 02 saturation study/ and generating customized AVS unique to this office visit / same day charting =   45 min new pt eval                   Christinia Gully, MD 05/16/2022

## 2022-05-16 NOTE — Patient Instructions (Signed)
Make sure you check your oxygen saturation at your highest level of activity(NOT after you stop)  to be sure it stays over 90% and keep track of it at least once a week, more often if breathing getting worse, and let me know if losing ground. (Collect the dots to connect the dots approach)    Please schedule a follow up office visit in 6 weeks, call sooner if needed in [place] office

## 2022-05-19 DIAGNOSIS — N184 Chronic kidney disease, stage 4 (severe): Secondary | ICD-10-CM | POA: Diagnosis not present

## 2022-05-26 ENCOUNTER — Encounter (HOSPITAL_COMMUNITY)
Admission: RE | Disposition: A | Discharge status: Home / Self Care | Payer: Self-pay | Source: Home / Self Care | Attending: Cardiovascular Disease

## 2022-05-26 ENCOUNTER — Telehealth: Payer: Self-pay

## 2022-05-26 ENCOUNTER — Ambulatory Visit (HOSPITAL_COMMUNITY)
Admission: RE | Admit: 2022-05-26 | Discharge: 2022-05-26 | Disposition: A | Discharge status: Home / Self Care | Payer: Medicare Other | Attending: Cardiovascular Disease | Admitting: Cardiovascular Disease

## 2022-05-26 ENCOUNTER — Ambulatory Visit (HOSPITAL_BASED_OUTPATIENT_CLINIC_OR_DEPARTMENT_OTHER)
Admission: RE | Admit: 2022-05-26 | Discharge: 2022-05-26 | Disposition: A | Discharge status: Home / Self Care | Payer: Medicare Other | Source: Ambulatory Visit | Attending: Internal Medicine | Admitting: Internal Medicine

## 2022-05-26 ENCOUNTER — Ambulatory Visit (HOSPITAL_COMMUNITY): Payer: Medicare Other | Admitting: Certified Registered Nurse Anesthetist

## 2022-05-26 ENCOUNTER — Ambulatory Visit (HOSPITAL_BASED_OUTPATIENT_CLINIC_OR_DEPARTMENT_OTHER): Payer: Medicare Other | Admitting: Certified Registered Nurse Anesthetist

## 2022-05-26 DIAGNOSIS — Z87891 Personal history of nicotine dependence: Secondary | ICD-10-CM | POA: Diagnosis not present

## 2022-05-26 DIAGNOSIS — F200 Paranoid schizophrenia: Secondary | ICD-10-CM | POA: Diagnosis not present

## 2022-05-26 DIAGNOSIS — I2510 Atherosclerotic heart disease of native coronary artery without angina pectoris: Secondary | ICD-10-CM | POA: Insufficient documentation

## 2022-05-26 DIAGNOSIS — I340 Nonrheumatic mitral (valve) insufficiency: Secondary | ICD-10-CM | POA: Diagnosis not present

## 2022-05-26 DIAGNOSIS — E669 Obesity, unspecified: Secondary | ICD-10-CM | POA: Insufficient documentation

## 2022-05-26 DIAGNOSIS — E785 Hyperlipidemia, unspecified: Secondary | ICD-10-CM | POA: Insufficient documentation

## 2022-05-26 DIAGNOSIS — Z79899 Other long term (current) drug therapy: Secondary | ICD-10-CM | POA: Diagnosis not present

## 2022-05-26 DIAGNOSIS — I252 Old myocardial infarction: Secondary | ICD-10-CM

## 2022-05-26 DIAGNOSIS — I509 Heart failure, unspecified: Secondary | ICD-10-CM

## 2022-05-26 DIAGNOSIS — I130 Hypertensive heart and chronic kidney disease with heart failure and stage 1 through stage 4 chronic kidney disease, or unspecified chronic kidney disease: Secondary | ICD-10-CM | POA: Diagnosis not present

## 2022-05-26 DIAGNOSIS — I083 Combined rheumatic disorders of mitral, aortic and tricuspid valves: Secondary | ICD-10-CM | POA: Insufficient documentation

## 2022-05-26 DIAGNOSIS — Z86718 Personal history of other venous thrombosis and embolism: Secondary | ICD-10-CM | POA: Insufficient documentation

## 2022-05-26 DIAGNOSIS — Z7901 Long term (current) use of anticoagulants: Secondary | ICD-10-CM | POA: Insufficient documentation

## 2022-05-26 DIAGNOSIS — D509 Iron deficiency anemia, unspecified: Secondary | ICD-10-CM | POA: Insufficient documentation

## 2022-05-26 DIAGNOSIS — I255 Ischemic cardiomyopathy: Secondary | ICD-10-CM | POA: Insufficient documentation

## 2022-05-26 DIAGNOSIS — K219 Gastro-esophageal reflux disease without esophagitis: Secondary | ICD-10-CM | POA: Diagnosis not present

## 2022-05-26 DIAGNOSIS — N183 Chronic kidney disease, stage 3 unspecified: Secondary | ICD-10-CM | POA: Insufficient documentation

## 2022-05-26 DIAGNOSIS — Z6828 Body mass index (BMI) 28.0-28.9, adult: Secondary | ICD-10-CM | POA: Diagnosis not present

## 2022-05-26 DIAGNOSIS — I5023 Acute on chronic systolic (congestive) heart failure: Secondary | ICD-10-CM | POA: Diagnosis not present

## 2022-05-26 DIAGNOSIS — I110 Hypertensive heart disease with heart failure: Secondary | ICD-10-CM

## 2022-05-26 HISTORY — PX: TEE WITHOUT CARDIOVERSION: SHX5443

## 2022-05-26 LAB — ECHO TEE
Area-P 1/2: 3.99 cm2
MV M vel: 5.13 m/s
MV Peak grad: 105.3 mmHg
Radius: 0.9 cm

## 2022-05-26 SURGERY — ECHOCARDIOGRAM, TRANSESOPHAGEAL
Anesthesia: Monitor Anesthesia Care

## 2022-05-26 MED ORDER — PROPOFOL 500 MG/50ML IV EMUL
INTRAVENOUS | Status: DC | PRN
  Administered 2022-05-26: 50 ug/kg/min via INTRAVENOUS

## 2022-05-26 MED ORDER — PHENYLEPHRINE HCL-NACL 20-0.9 MG/250ML-% IV SOLN
INTRAVENOUS | Status: DC | PRN
  Administered 2022-05-26: 25 ug/min via INTRAVENOUS

## 2022-05-26 MED ORDER — SODIUM CHLORIDE 0.9 % IV SOLN: INTRAVENOUS | Status: DC

## 2022-05-26 MED ORDER — PROPOFOL 10 MG/ML IV BOLUS
INTRAVENOUS | Status: DC | PRN
  Administered 2022-05-26: 20 mg via INTRAVENOUS
  Administered 2022-05-26: 30 mg via INTRAVENOUS
  Administered 2022-05-26: 20 mg via INTRAVENOUS

## 2022-05-26 NOTE — CV Procedure (Signed)
TEE: Anesthesia: Propofol  LVE EF 35-40% posterior lateral hypokinesis Severe ischemic MR by 3D A2/P2 suitable leaflet length, area and gradient for mitral clip Mld AR Mild/moderate TR Normal RV No LAA thrombus No ASD/PFO  Refer to structural team to consider mitral clip   [name] MD [facility]

## 2022-05-26 NOTE — Discharge Instructions (Signed)

## 2022-05-26 NOTE — Telephone Encounter (Signed)
[facility] PASCAL review of 05/26/2022 TEE:

## 2022-05-26 NOTE — Telephone Encounter (Signed)
Per Dr. Debara Pickett and Dr. Johnsie Cancel, called to arrange MR consult with Dr. Burt Knack or Dr. Ali Lowe. The patient reports he is busy and asked to call tomorrow.

## 2022-05-26 NOTE — Anesthesia Preprocedure Evaluation (Signed)
Anesthesia Evaluation  Patient identified by MRN, date of birth, ID band Patient awake    Reviewed: Allergy & Precautions, H&P , NPO status , Patient's Chart, lab work & pertinent test results  Airway Mallampati: II  TM Distance: >3 FB Neck ROM: Full    Dental no notable dental hx.    Pulmonary neg pulmonary ROS, former smoker   Pulmonary exam normal breath sounds clear to auscultation       Cardiovascular hypertension, Pt. on medications and Pt. on home beta blockers + CAD, + Past MI, + Peripheral Vascular Disease and +CHF  + Valvular Problems/Murmurs AI and MR  Rhythm:Regular Rate:Normal + Systolic murmurs    Neuro/Psych negative neurological ROS  negative psych ROS   GI/Hepatic Neg liver ROS,GERD  Medicated,,  Endo/Other  negative endocrine ROS    Renal/GU negative Renal ROS  negative genitourinary   Musculoskeletal negative musculoskeletal ROS (+)    Abdominal   Peds negative pediatric ROS (+)  Hematology negative hematology ROS (+)   Anesthesia Other Findings   Reproductive/Obstetrics negative OB ROS                             Anesthesia Physical Anesthesia Plan  ASA: 3  Anesthesia Plan: MAC   Post-op Pain Management: Minimal or no pain anticipated   Induction: Intravenous  PONV Risk Score and Plan: 1 and Propofol infusion and Treatment may vary due to age or medical condition  Airway Management Planned: Simple Face Mask  Additional Equipment:   Intra-op Plan:   Post-operative Plan:   Informed Consent: I have reviewed the patients History and Physical, chart, labs and discussed the procedure including the risks, benefits and alternatives for the proposed anesthesia with the patient or authorized representative who has indicated his/her understanding and acceptance.     Dental advisory given  Plan Discussed with: CRNA and Surgeon  Anesthesia Plan Comments:         Anesthesia Quick Evaluation

## 2022-05-26 NOTE — Transfer of Care (Signed)
Immediate Anesthesia Transfer of Care Note  Patient: Glenn Hawkins  Procedure(s) Performed: TRANSESOPHAGEAL ECHOCARDIOGRAM (TEE)  Patient Location: Endoscopy Unit  Anesthesia Type:MAC  Level of Consciousness: awake  Airway & Oxygen Therapy: Patient Spontanous Breathing  Post-op Assessment: Report given to RN and Post -op Vital signs reviewed and stable  Post vital signs: Reviewed and stable  Last Vitals:  Vitals Value Taken Time  BP 126/80   Temp 36.6 C 05/26/22 1117  Pulse 64 05/26/22 1118  Resp 16 05/26/22 1118  SpO2 94 % 05/26/22 1118  Vitals shown include unvalidated device data.  Last Pain:  Vitals:   05/26/22 1117  TempSrc: Tympanic  PainSc:          Complications: No notable events documented.

## 2022-05-26 NOTE — Interval H&P Note (Signed)
History and Physical Interval Note:  05/26/2022 10:10 AM  Glenn Hawkins  has presented today for surgery, with the diagnosis of MITRAL REGURGIGATION.  The various methods of treatment have been discussed with the patient and family. After consideration of risks, benefits and other options for treatment, the patient has consented to  Procedure(s): TRANSESOPHAGEAL ECHOCARDIOGRAM (TEE) (N/A) as a surgical intervention.  The patient's history has been reviewed, patient examined, no change in status, stable for surgery.  I have reviewed the patient's chart and labs.  Questions were answered to the patient's satisfaction.     [name]

## 2022-05-26 NOTE — Telephone Encounter (Signed)
Per [employer] review of 05/26/2022 TEE,  "For patient AK: This is Secondary MR and does not require a surgical consult  This valve looks suitable for a MitraClip implant. In the Bicaval and SAXB views, the fossa looks reasonable for a transseptal puncture. LA dimensions are large enough for steering and straddle of the Clip Delivery System. The MR jet is broad based and centrally located. The posterior leaflet measures about 1.7 mm in the LVOT grasping view. Gradient measures 2 (HR 70) and MVA measures 8 cm2. Based on this information, I'd recommend an  NTW or XTW and assess for gradient. "

## 2022-05-27 ENCOUNTER — Encounter (HOSPITAL_COMMUNITY): Payer: Self-pay | Admitting: Cardiovascular Disease

## 2022-05-27 DIAGNOSIS — I491 Atrial premature depolarization: Secondary | ICD-10-CM | POA: Diagnosis not present

## 2022-05-27 DIAGNOSIS — I509 Heart failure, unspecified: Secondary | ICD-10-CM | POA: Diagnosis not present

## 2022-05-27 DIAGNOSIS — R9431 Abnormal electrocardiogram [ECG] [EKG]: Secondary | ICD-10-CM | POA: Diagnosis not present

## 2022-05-27 DIAGNOSIS — R062 Wheezing: Secondary | ICD-10-CM | POA: Diagnosis not present

## 2022-05-27 DIAGNOSIS — I110 Hypertensive heart disease with heart failure: Secondary | ICD-10-CM | POA: Diagnosis not present

## 2022-05-27 DIAGNOSIS — I5022 Chronic systolic (congestive) heart failure: Secondary | ICD-10-CM | POA: Diagnosis not present

## 2022-05-27 NOTE — Anesthesia Postprocedure Evaluation (Signed)
Anesthesia Post Note  Patient: Glenn Hawkins  Procedure(s) Performed: TRANSESOPHAGEAL ECHOCARDIOGRAM (TEE)     Patient location during evaluation: PACU Anesthesia Type: MAC Level of consciousness: awake and alert Pain management: pain level controlled Vital Signs Assessment: post-procedure vital signs reviewed and stable Respiratory status: spontaneous breathing, nonlabored ventilation, respiratory function stable and patient connected to nasal cannula oxygen Cardiovascular status: stable and blood pressure returned to baseline Postop Assessment: no apparent nausea or vomiting Anesthetic complications: no  No notable events documented.  Last Vitals:  Vitals:   05/26/22 1127 05/26/22 1147  BP: 117/82 130/80  Pulse: 63 (!) 49  Resp: 20 (!) 21  Temp:    SpO2: 96% (!) 82%    Last Pain:  Vitals:   05/26/22 1147  TempSrc:   PainSc: 0-No pain                 Dawsyn Zurn S

## 2022-05-27 NOTE — Telephone Encounter (Signed)
As requested yesterday, called the patient to schedule consult. He is currently in the ER at [facility] for cough. He understands he will be called in a couple days to check in and schedule visit.

## 2022-05-30 DIAGNOSIS — N184 Chronic kidney disease, stage 4 (severe): Secondary | ICD-10-CM | POA: Diagnosis not present

## 2022-05-30 NOTE — Telephone Encounter (Signed)
The patient was instructed to increase Lasix at the ED and he feels much better.  Scheduled him for mTEER consult with Dr. [name] 06/05/2022. He was grateful for call and agreed with plan.

## 2022-06-04 DIAGNOSIS — R809 Proteinuria, unspecified: Secondary | ICD-10-CM | POA: Diagnosis not present

## 2022-06-04 DIAGNOSIS — N184 Chronic kidney disease, stage 4 (severe): Secondary | ICD-10-CM | POA: Diagnosis not present

## 2022-06-04 DIAGNOSIS — I129 Hypertensive chronic kidney disease with stage 1 through stage 4 chronic kidney disease, or unspecified chronic kidney disease: Secondary | ICD-10-CM | POA: Diagnosis not present

## 2022-06-04 DIAGNOSIS — D638 Anemia in other chronic diseases classified elsewhere: Secondary | ICD-10-CM | POA: Diagnosis not present

## 2022-06-04 DIAGNOSIS — E8722 Chronic metabolic acidosis: Secondary | ICD-10-CM | POA: Diagnosis not present

## 2022-06-04 DIAGNOSIS — I5042 Chronic combined systolic (congestive) and diastolic (congestive) heart failure: Secondary | ICD-10-CM | POA: Diagnosis not present

## 2022-06-04 DIAGNOSIS — D696 Thrombocytopenia, unspecified: Secondary | ICD-10-CM | POA: Diagnosis not present

## 2022-06-05 ENCOUNTER — Encounter: Payer: Self-pay | Admitting: Cardiovascular Disease

## 2022-06-05 ENCOUNTER — Ambulatory Visit: Payer: Medicare Other | Attending: Cardiovascular Disease | Admitting: Cardiovascular Disease

## 2022-06-05 VITALS — BP 130/90 | HR 55 | Ht 68.0 in | Wt 182.2 lb

## 2022-06-05 DIAGNOSIS — I340 Nonrheumatic mitral (valve) insufficiency: Secondary | ICD-10-CM | POA: Diagnosis not present

## 2022-06-05 MED ORDER — FUROSEMIDE 40 MG PO TABS: 40.0000 mg | ORAL_TABLET | Freq: Two times a day (BID) | ORAL | 3 refills | Status: DC

## 2022-06-05 MED ORDER — NITROGLYCERIN 0.4 MG SL SUBL: SUBLINGUAL_TABLET | SUBLINGUAL | 6 refills | Status: DC

## 2022-06-05 NOTE — H&P (View-Only) (Signed)
Cardiology Office Note:    Date:  06/08/2022   ID:  Glenn Hawkins, DOB 11/03/1955, MRN [id number]  PCP:  Vyas, Dhruv B, MD   [facility] Providers Cardiologist:  Muhammad Arida, MD     Referring MD: Vyas, Dhruv B, MD   No chief complaint on file.   History of Present Illness:    Glenn Hawkins is a 66 y.o. male presenting for evaluation of mitral regurgitation, referred by Dr Hilty.  The patient has a hx of ischemic cardiomyopathy and chronic systolic heart failure. However, he has been treated medically and has never undergone cardiac catheterization because of the presence of chronic kidney disease with concern over contrast nephropathy. He initially presented in 2013 with a late presenting inferior MI. He wasn't taken to the cath lab because of CKD and medical therapy was recommended. He has been anticoagulated since 2016 when he had extensive DVT. The patient was hospitalized twice with respiratory failure in March of this year, nearly requiring intubation during the first hospitalization. He was diuresed and treated for heart failure, and was found to have severe mitral regurgitation felt to be ischemic in etiology. HF treatment has been complicated by the presence of Stage IV CKD. He ultimately underwent TEE confirming severe ischemic MR secondary to posterior leaflet restriction with moderately severe segmental LV systolic dysfunction and LVEF of 35%.   The patient is here with his sister today. He has been trying to follow a low sodium diet, but it doesn't sound like he's been compliant with that in the past. He continues to stuggle with exertional dyspnea and fatigue. He doesn't currently have problems with orthopnea, PND, or edema. No lightheadedness or syncope. He denies chest pain or pressure. He reports that he's been feeling better since he doubled his lasix dose.   Past Medical History:  Diagnosis Date   Aortic insufficiency    a. 08/2019 Echo: mild to mod AI.   C.  difficile diarrhea 07/2020   CKD (chronic kidney disease) stage 3, GFR 30-59 ml/min    baseline Cr around 2.5   Combined systolic and diastolic congestive heart failure 05/06/2022   Coronary atherosclerosis of native coronary artery    a. 02/2011 Late presentation MI-->Myoview w/ large area of transmural infarct in LCX distribution, no ischemia.   Depressive disorder, not elsewhere classified    Esophageal reflux    Gout, unspecified    Hemorrhoids    a. 01/2016 s/p hemorrhoidectomy.   HFrEF (heart failure with reduced ejection fraction)    a. 04/2012 Echo: EF 40%, inflat AK, Gr1 DD, mild AI/MR, triv TR; b. 11/2017 EchoP EF 30-35%, inflat HK; c. 08/2019 Echo: EF 30-35%, gr1 DD, inflat AK.   History of DVT (deep vein thrombosis)    a. Chronic Eliquis.   Hyperlipidemia LDL goal <70    Hypertension    Hypotension, unspecified    Ischemic cardiomyopathy    a. 04/2012 Echo: EF 40%, inflat AK, Gr1 DD, mild AI/MR, triv TR; b. 11/2017 EchoP EF 30-35%, inflat HK; c. 08/2019 Echo: EF 30-35%, gr1 DD, inflat AK. Nl RV size/fxn. Mild MR. Mild to mod AI.   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 04/2012 Echo: EF 40%, mild MR.   Myocardial infarction (lateral wall) 2013   a. 02/2011 - late presentation. Managed conservatively 2/2 CKD;  b. 02/2011 Myoview: Large transmural infarct in the LCX distribution, no ischemia.   PAD (peripheral artery disease)    a. 08/2015 ABI/duplex: R: 0.86, L   0.96. Duplex w/ bilateral heterogeneous plaque in mid fem arteries, no significant stenoses; b. 08/2019 ABI/Duplex: prob bilat inflow dzs w/ stable ABIs (R 0.85, L 0.93).   Personal history of tobacco use, presenting hazards to health    Torn ligament    Unspecified schizophrenia, unspecified condition     Past Surgical History:  Procedure Laterality Date   BIOPSY  12/17/2020   Procedure: BIOPSY;  Surgeon: Carver, Charles K, DO;  Location: [facility];  Service: Endoscopy;;   COLONOSCOPY     COLONOSCOPY N/A 11/10/2014    Procedure: COLONOSCOPY;  Surgeon: Steven Paul Armbruster, MD;  Location: [facility];  Service: Gastroenterology;  Laterality: N/A;   COLONOSCOPY WITH PROPOFOL N/A 05/08/2015   Surgeon: Sandi L Fields, MD; nonthrombosed external hemorrhoids, one 8 mm tubular adenoma, one 4 mm tubular adenoma.  Repeat colonoscopy in 5-10 years.   COLONOSCOPY WITH PROPOFOL N/A 12/17/2020   Procedure: COLONOSCOPY WITH PROPOFOL;  Surgeon: Carver, Charles K, DO;  Location: [facility];  Service: Endoscopy;  Laterality: N/A;  8:30am   ESOPHAGOGASTRODUODENOSCOPY (EGD) WITH PROPOFOL N/A 05/08/2015   Surgeon: Sandi L Fields, MD; LA grade a reflux esophagitis, normal stomach and duodenum.   ESOPHAGOGASTRODUODENOSCOPY (EGD) WITH PROPOFOL N/A 12/17/2020   Procedure: ESOPHAGOGASTRODUODENOSCOPY (EGD) WITH PROPOFOL;  Surgeon: Carver, Charles K, DO;  Location: [facility];  Service: Endoscopy;  Laterality: N/A;   EYE SURGERY Right    removal of foreign body   HEMORRHOID SURGERY N/A 02/20/2016   Procedure: EXTENSIVE HEMORRHOIDECTOMY;  Surgeon: Mark Jenkins, MD;  Location: [facility];  Service: General;  Laterality: N/A;   None     POLYPECTOMY  05/08/2015   Procedure: POLYPECTOMY;  Surgeon: Sandi L Fields, MD;  Location: [facility];  Service: Endoscopy;;  transverse colon polyp   TEE WITHOUT CARDIOVERSION N/A 05/26/2022   Procedure: TRANSESOPHAGEAL ECHOCARDIOGRAM (TEE);  Surgeon: Nishan, Peter C, MD;  Location: [facility];  Service: Cardiovascular;  Laterality: N/A;    Current Medications: Current Meds  Medication Sig   apixaban (ELIQUIS) 2.5 MG TABS tablet Take 2.5 mg by mouth 2 (two) times daily.   brimonidine (ALPHAGAN) 0.2 % ophthalmic solution Place 1 drop into both eyes 3 (three) times daily.   colchicine 0.6 MG tablet Take 0.6 mg by mouth 2 (two) times daily.   Daridorexant HCl 25 MG TABS Take 25 mg by mouth at bedtime.   esomeprazole (NEXIUM) 20 MG capsule Take 1 capsule (20 mg total) by mouth daily before  breakfast.   isosorbide mononitrate (IMDUR) 30 MG 24 hr tablet Take 1 tablet (30 mg total) by mouth every morning. Patient needs appointment for further refills. 1 st attempt   latanoprost (XALATAN) 0.005 % ophthalmic solution Place 1 drop into both eyes at bedtime.   melatonin 3 MG TABS tablet Take 3 mg by mouth at bedtime.   metoprolol tartrate (LOPRESSOR) 100 MG tablet Take 100 mg by mouth 2 (two) times daily.   OLANZapine (ZYPREXA) 15 MG tablet Take 15 mg by mouth at bedtime.   OLANZapine (ZYPREXA) 5 MG tablet Take 5 mg by mouth daily.   risperiDONE (RISPERDAL) 1 MG tablet Take 1 tablet (1 mg total) by mouth at bedtime.   rosuvastatin (CRESTOR) 40 MG tablet Take 20 mg by mouth daily.   sertraline (ZOLOFT) 100 MG tablet Take 100 mg by mouth daily.   sodium bicarbonate 650 MG tablet Take 650 mg by mouth 2 (two) times daily.   [DISCONTINUED] allopurinol (ZYLOPRIM) 100 MG tablet Take 1   tablet (100 mg total) by mouth daily.   [DISCONTINUED] furosemide (LASIX) 40 MG tablet Take 1 tablet (40 mg total) by mouth daily.   [DISCONTINUED] nitroGLYCERIN (NITROSTAT) 0.4 MG SL tablet Place 1 tablet (0.4 mg total) under the tongue every 5 (five) minutes as needed for chest pain.     Allergies:   Nifedipine, Rabeprazole, Benazepril, Haloperidol, and Omeprazole   Social History   Socioeconomic History   Marital status: Divorced    Spouse name: Not on file   Number of children: Not on file   Years of education: Not on file   Highest education level: Not on file  Occupational History   Not on file  Tobacco Use   Smoking status: Former    Packs/day: 1.00    Years: 23.00    Additional pack years: 0.00    Total pack years: 23.00    Types: Cigarettes    Quit date: 02/25/1996    Years since quitting: 26.3   Smokeless tobacco: Never   Tobacco comments:    + 15 years of smoking  Vaping Use   Vaping Use: Never used  Substance and Sexual Activity   Alcohol use: No    Comment: Daily alcohol use 20+  years ago   Drug use: Not Currently    Types: Cocaine    Comment: History of intranasal cocaine and speed 20+ years ago.   Sexual activity: Not Currently    Birth control/protection: None  Other Topics Concern   Not on file  Social History Narrative   Not on file   Social Determinants of Health   Financial Resource Strain: Not on file  Food Insecurity: No Food Insecurity (05/07/2022)   Hunger Vital Sign    Worried About Running Out of Food in the Last Year: Never true    Ran Out of Food in the Last Year: Never true  Transportation Needs: No Transportation Needs (05/07/2022)   PRAPARE - Transportation    Lack of Transportation (Medical): No    Lack of Transportation (Non-Medical): No  Physical Activity: Not on file  Stress: Not on file  Social Connections: Not on file     Family History: The patient's family history includes Crohn's disease in his sister. There is no history of Colon cancer.  ROS:   Please see the history of present illness.    All other systems reviewed and are negative.  EKGs/Labs/Other Studies Reviewed:    The following studies were reviewed today: Cardiac Studies & Procedures       ECHOCARDIOGRAM  ECHOCARDIOGRAM COMPLETE 05/07/2022  Narrative ECHOCARDIOGRAM REPORT    Patient Name:   Glenn Hawkins Date of Exam: 05/07/2022 Medical Rec #:  [id number]      Height:       68.0 in Accession #:    [id number]     Weight:       176.8 lb Date of Birth:  08/07/1955       BSA:          1.939 m Patient Age:    66 years       BP:           154/89 mmHg Patient Gender: M              HR:           89 bpm. Exam Location:  Inpatient  Procedure: 2D Echo, Color Doppler and Cardiac Doppler  Indications:    I50.21 Acute systolic (congestive) heart failure  History:          Patient has prior history of Echocardiogram examinations, most recent 09/15/2019. CHF, PAD; Risk Factors:Hypertension and Dyslipidemia.  Sonographer:    Emily Senior RDCS Referring Phys:  [id number] RONDELL A SMITH  IMPRESSIONS   1. Left ventricular ejection fraction, by estimation, is 35 to 40%. The left ventricle has moderately decreased function. The left ventricle demonstrates regional wall motion abnormalities (see scoring diagram/findings for description). The left ventricular internal cavity size was moderately to severely dilated. Left ventricular diastolic parameters are indeterminate. 2. Right ventricular systolic function is mildly reduced. The right ventricular size is normal. There is mildly elevated pulmonary artery systolic pressure. The estimated right ventricular systolic pressure is 41.2 mmHg. 3. Left atrial size was severely dilated. 4. A small pericardial effusion is present. The pericardial effusion is localized near the right atrium. 5. 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 There is splay artifact. The mitral valve is grossly normal. Severe mitral valve regurgitation. No evidence of mitral stenosis. 6. The aortic valve is tricuspid. There is mild calcification of the aortic valve. Aortic valve regurgitation is mild to moderate. No aortic stenosis is present. 7. The inferior vena cava is dilated in size with >50% respiratory variability, suggesting right atrial pressure of 8 mmHg.  FINDINGS Left Ventricle: Left ventricular ejection fraction, by estimation, is 35 to 40%. The left ventricle has moderately decreased function. The left ventricle demonstrates regional wall motion abnormalities. The left ventricular internal cavity size was moderately to severely dilated. There is no left ventricular hypertrophy. Left ventricular diastolic function could not be evaluated due to mitral regurgitation (moderate or greater). Left ventricular diastolic parameters are indeterminate.   LV Wall Scoring: The mid and distal lateral wall and mid anterolateral segment are  akinetic. The entire anterior wall, basal anterolateral segment, and mid inferior segment are hypokinetic.  Right Ventricle: The right ventricular size is normal. No increase in right ventricular wall thickness. Right ventricular systolic function is mildly reduced. There is mildly elevated pulmonary artery systolic pressure. The tricuspid regurgitant velocity is 2.88 m/s, and with an assumed right atrial pressure of 8 mmHg, the estimated right ventricular systolic pressure is 41.2 mmHg.  Left Atrium: Left atrial size was severely dilated.  Right Atrium: Right atrial size was normal in size.  Pericardium: A small pericardial effusion is present. The pericardial effusion is localized near the right atrium.  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 There is splay artifact. The mitral valve is grossly normal. Severe mitral valve regurgitation. No evidence of mitral valve stenosis.  Tricuspid Valve: The tricuspid valve is normal in structure. Tricuspid valve regurgitation is mild . No evidence of tricuspid stenosis.  Aortic Valve: The aortic valve is tricuspid. There is mild calcification of the aortic valve. Aortic valve regurgitation is mild to moderate. No aortic stenosis is present.  Pulmonic Valve: The pulmonic valve was normal in structure. Pulmonic valve regurgitation is mild. No evidence of pulmonic stenosis.  Aorta: The aortic root is normal in size and structure.  Venous: The inferior vena cava is dilated in size with greater than 50% respiratory variability, suggesting right atrial pressure of 8 mmHg.  IAS/Shunts: No atrial level shunt detected by color flow Doppler.   LEFT VENTRICLE PLAX 2D LVIDd:         6.90 cm      Diastology LVIDs:           5.50 cm      LV e' medial:    5.55 cm/s LV PW:         1.00 cm      LV E/e' medial:  22.3 LV IVS:        0.80 cm      LV e'  lateral:   9.36 cm/s LVOT diam:     2.40 cm      LV E/e' lateral: 13.2 LV SV:         106 LV SV Index:   55 LVOT Area:     4.52 cm  LV Volumes (MOD) LV vol d, MOD A2C: 200.0 ml LV vol d, MOD A4C: 180.0 ml LV vol s, MOD A2C: 127.0 ml LV vol s, MOD A4C: 115.0 ml LV SV MOD A2C:     73.0 ml LV SV MOD A4C:     180.0 ml LV SV MOD BP:      64.7 ml  RIGHT VENTRICLE RV S prime:     14.60 cm/s TAPSE (M-mode): 2.1 cm  LEFT ATRIUM              Index        RIGHT ATRIUM           Index LA diam:        5.00 cm  2.58 cm/m   RA Area:     17.80 cm LA Vol (A2C):   94.1 ml  48.52 ml/m  RA Volume:   50.40 ml  25.99 ml/m LA Vol (A4C):   101.0 ml 52.08 ml/m LA Biplane Vol: 97.6 ml  50.32 ml/m AORTIC VALVE LVOT Vmax:   112.00 cm/s LVOT Vmean:  77.700 cm/s LVOT VTI:    0.235 m  AORTA Ao Root diam: 3.40 cm Ao Asc diam:  3.50 cm  MITRAL VALVE                  TRICUSPID VALVE MV Area (PHT): 3.34 cm       TR Peak grad:   33.2 mmHg MV Decel Time: 227 msec       TR Vmax:        288.00 cm/s MR Peak grad:    141.4 mmHg MR Mean grad:    102.5 mmHg   SHUNTS MR Vmax:         594.50 cm/s  Systemic VTI:  0.24 m MR Vmean:        485.5 cm/s   Systemic Diam: 2.40 cm MR PISA:         1.90 cm MR PISA Eff ROA: 12 mm MR PISA Radius:  0.55 cm MV E velocity: 124.00 cm/s MV A velocity: 71.70 cm/s MV E/A ratio:  1.73  Gayatri Acharya MD Electronically signed by Gayatri Acharya MD Signature Date/Time: 05/07/2022/11:05:55 AM    Final   TEE  ECHO TEE 05/26/2022  Narrative TRANSESOPHOGEAL ECHO REPORT    Patient Name:   Glenn Hawkins Date of Exam: 05/26/2022 Medical Rec #:  [id number]      Height:       68.0 in Accession #:    [id number]     Weight:       186.6 lb Date of Birth:  04/02/1955       BSA:          1.984 m Patient Age:    66 years       BP:           134/81 mmHg Patient   Gender: M              HR:           76 bpm. Exam Location:  Outpatient  Procedure: 3D Echo, Transesophageal  Echo, Color Doppler and Cardiac Doppler  Indications:     I34.0 Nonrheumatic mitral (valve) insufficiency  History:         Patient has prior history of Echocardiogram examinations, most recent 05/07/2022. Cardiomyopathy and CHF, CAD, Mitral Valve Disease; Risk Factors:Hypertension and Dyslipidemia.  Sonographer:     Melissa Kafa Referring Phys:  [id number] KENNETH C HILTY Diagnosing Phys: Peter Nishan MD  PROCEDURE: After discussion of the risks and benefits of a TEE, an informed consent was obtained from the patient. The transesophogeal probe was passed without difficulty through the esophogus of the patient. Imaged were obtained with the patient in a left lateral decubitus position. Sedation performed by different physician. The patient was monitored while under deep sedation. Anesthestetic sedation was provided intravenously by Anesthesiology: 70mg of Propofol. Image quality was good. The patient's vital signs; including heart rate, blood pressure, and oxygen saturation; remained stable throughout the procedure. The patient developed no complications during the procedure.  IMPRESSIONS   1. Posterior lateral hypokinesis . Left ventricular ejection fraction, by estimation, is 35 to 40%. The left ventricle has moderately decreased function. The left ventricle demonstrates regional wall motion abnormalities (see scoring diagram/findings for description). The left ventricular internal cavity size was moderately dilated. 2. Right ventricular systolic function is mildly reduced. The right ventricular size is mildly enlarged. 3. Left atrial size was severely dilated. No left atrial/left atrial appendage thrombus was detected. 4. Right atrial size was moderately dilated. 5. Severe ischemic MR two jets betwen A2/P2 suitable leaflet length, gradients and MVA for clip . The mitral valve is abnormal. Severe mitral valve regurgitation. No evidence of mitral stenosis. 6. Tricuspid valve regurgitation is  moderate. 7. The aortic valve is tricuspid. There is mild calcification of the aortic valve. There is mild thickening of the aortic valve. Aortic valve regurgitation is mild. Aortic valve sclerosis is present, with no evidence of aortic valve stenosis.  FINDINGS Left Ventricle: Posterior lateral hypokinesis. Left ventricular ejection fraction, by estimation, is 35 to 40%. The left ventricle has moderately decreased function. The left ventricle demonstrates regional wall motion abnormalities. The left ventricular internal cavity size was moderately dilated. There is no left ventricular hypertrophy.  Right Ventricle: The right ventricular size is mildly enlarged. Right vetricular wall thickness was not assessed. Right ventricular systolic function is mildly reduced.  Left Atrium: Left atrial size was severely dilated. No left atrial/left atrial appendage thrombus was detected.  Right Atrium: Right atrial size was moderately dilated.  Pericardium: There is no evidence of pericardial effusion.  Mitral Valve: Severe ischemic MR two jets betwen A2/P2 suitable leaflet length, gradients and MVA for clip. The mitral valve is abnormal. Severe mitral valve regurgitation. No evidence of mitral valve stenosis. MV peak gradient, 5.7 mmHg. The mean mitral valve gradient is 2.0 mmHg.  Tricuspid Valve: The tricuspid valve is normal in structure. Tricuspid valve regurgitation is moderate.  Aortic Valve: The aortic valve is tricuspid. There is mild calcification of the aortic valve. There is mild thickening of the aortic valve. Aortic valve regurgitation is mild. Aortic valve sclerosis is present, with no evidence of aortic valve stenosis.  Pulmonic Valve: The pulmonic valve was normal in structure. Pulmonic valve regurgitation is mild.  Aorta: The aortic root is normal in size and structure.    IAS/Shunts: No atrial level shunt detected by color flow Doppler.  Additional Comments: Spectral Doppler  performed.  MITRAL VALVE                  TRICUSPID VALVE MV Area (PHT): 3.99 cm       TR Peak grad:   42.5 mmHg MV Peak grad:  5.7 mmHg       TR Vmax:        326.00 cm/s MV Mean grad:  2.0 mmHg MV Vmax:       1.19 m/s MV Vmean:      59.2 cm/s MV Decel Time: 190 msec MR Peak grad:    105.3 mmHg MR Mean grad:    77.0 mmHg MR Vmax:         513.00 cm/s MR Vmean:        423.0 cm/s MR PISA:         5.09 cm MR PISA Eff ROA: 33 mm MR PISA Radius:  0.90 cm MV E velocity: 96.20 cm/s  Peter Nishan MD Electronically signed by Peter Nishan MD Signature Date/Time: 05/26/2022/11:56:13 AM    Final             EKG:  Most recent EKG is reviewed from 05/26/22 showing NSR, nonspecific T wave abnormality  Recent Labs: 05/06/2022: B Natriuretic Peptide 1,699.1; TSH 1.413 05/07/2022: ALT 19 05/08/2022: BUN 45; Creatinine, Ser 3.45; Hemoglobin 7.7; Platelets 77; Potassium 3.7; Sodium 141  Recent Lipid Panel    Component Value Date/Time   CHOL 191 08/02/2019 1515   TRIG 175 (H) 08/02/2019 1515   HDL 37 (L) 08/02/2019 1515   CHOLHDL 5.2 (H) 08/02/2019 1515   CHOLHDL 4.2 11/09/2014 0525   VLDL 23 11/09/2014 0525   LDLCALC 123 (H) 08/02/2019 1515   LDLDIRECT 125 (H) 08/02/2019 1515     Risk Assessment/Calculations:          Physical Exam:    VS:  BP (!) 130/90   Pulse (!) 55   Ht 5' 8" (1.727 m)   Wt 182 lb 3.2 oz (82.6 kg)   SpO2 99%   BMI 27.70 kg/m     Wt Readings from Last 3 Encounters:  06/05/22 182 lb 3.2 oz (82.6 kg)  05/26/22 175 lb (79.4 kg)  05/16/22 186 lb 9.6 oz (84.6 kg)     GEN:  Well nourished, well developed in no acute distress HEENT: Normal NECK: No JVD; No carotid bruits LYMPHATICS: No lymphadenopathy CARDIAC: RRR, 2/6 holosystolic murmur at the LLSB and apex RESPIRATORY:  Clear to auscultation without rales, wheezing or rhonchi  ABDOMEN: Soft, non-tender, non-distended MUSCULOSKELETAL:  No edema; No deformity  SKIN: Warm and dry NEUROLOGIC:  Alert  and oriented x 3 PSYCHIATRIC:  Normal affect   ASSESSMENT:    1. Nonrheumatic mitral (valve) insufficiency    PLAN:    In order of problems listed above:  The patient has severe, ischemic mitral regurgitation with LA dilatation, restriction of the posterior leaflet of the mitral valve, and moderately severe LV dysfunction. The patient has NYHA functional class 3 symptoms of shortness of breath consistent with chronic systolic heart failure. His case is complicated by the presence of CAD and old MI with unknown coronary anatomy, stage IV chronic kidney disease (previously declining placement of an AV fistula), anemia with most recent HgB 7.7 mg/dL, peripheral arterial disease, and schizophrenia. I have reviewed the natural history of mitral regurgitation with the patient and their family members who are present today.   We have discussed the limitations of medical therapy and the poor prognosis associated with symptomatic mitral regurgitation. We have also reviewed potential treatment options, including palliative medical therapy, conventional surgical mitral valve repair or replacement, and percutaneous mitral valve therapies such as edge-to-edge mitral valve approximation with MitraClip. We discussed treatment options in the context of this patient's specific comorbid medical conditions outlined above. In reviewing his echo and TEE images, I think he has suitable for transcatheter edge-to-edge repair of the mitral valve with either MitraClip or Pascal and he clearly has severe mitral regurgitation that warrants treatment. I advised the patient and his sister that we will review his case with our multidisciplinary heart valve team and determine appropriate further workup and treatment. Ideally he should have cardiac catheterization as the etiology of his MR is ischemic and he is at risk of multivessel CAD. This would come with risk of AKI in the background of his advanced kidney disease. Alternatively, we  could consider proceeding without evaluating his coronary arteries to avoid the risk to his kidneys, but I am reluctant to do that. We may also need to involve nephrology in this discussion. I will follow-up with them once we review his case. As part of his evaluation today, I reviewed the TEER procedure with them, and discussed the typical risks and expected recovery. Will reach back out to his sister once we discuss his case.            Medication Adjustments/Labs and Tests Ordered: Current medicines are reviewed at length with the patient today.  Concerns regarding medicines are outlined above.  No orders of the defined types were placed in this encounter.  Meds ordered this encounter  Medications   furosemide (LASIX) 40 MG tablet    Sig: Take 1 tablet (40 mg total) by mouth 2 (two) times daily.    Dispense:  180 tablet    Refill:  3   nitroGLYCERIN (NITROSTAT) 0.4 MG SL tablet    Sig: Dissolve 1 tablet under the tongue every 5 minutes as needed for chest pain. Max of 3 doses, then 911.    Dispense:  25 tablet    Refill:  6    Patient Instructions  Medication Instructions:  CONTINUE Furosemide 40mg twice daily REFILLED Nitroglycerin *If you need a refill on your cardiac medications before your next appointment, please call your pharmacy*   Lab Work: NONE If you have labs (blood work) drawn today and your tests are completely normal, you will receive your results only by: MyChart Message (if you have MyChart) OR A paper copy in the mail If you have any lab test that is abnormal or we need to change your treatment, we will call you to review the results.   Testing/Procedures: NONE   Follow-Up: At [facility], you and your health needs are our priority.  As part of our continuing mission to provide you with exceptional heart care, we have created designated Provider Care Teams.  These Care Teams include your primary Cardiologist (physician) and Advanced Practice  Providers (APPs -  Physician Assistants and Nurse Practitioners) who all work together to provide you with the care you need, when you need it.  We recommend signing up for the patient portal called "MyChart".  Sign up information is provided on this After Visit Summary.  MyChart is used to connect with patients for Virtual Visits (Telemedicine).  Patients are able to view lab/test results, encounter notes, upcoming appointments, etc.  Non-urgent messages can be sent   to your provider as well.   To learn more about what you can do with MyChart, go to [email or web address].    Your next appointment:   Structural Team will follow-up  Provider:   Edelyn Heidel, MD     Signed, Nyanna Heideman, MD  06/08/2022 5:09 PM    [facility] 

## 2022-06-05 NOTE — Patient Instructions (Signed)
Medication Instructions:  CONTINUE Furosemide 40mg  twice daily REFILLED Nitroglycerin *If you need a refill on your cardiac medications before your next appointment, please call your pharmacy*   Lab Work: NONE If you have labs (blood work) drawn today and your tests are completely normal, you will receive your results only by: MyChart Message (if you have MyChart) OR A paper copy in the mail If you have any lab test that is abnormal or we need to change your treatment, we will call you to review the results.   Testing/Procedures: NONE   Follow-Up: At [facility], you and your health needs are our priority.  As part of our continuing mission to provide you with exceptional heart care, we have created designated Provider Care Teams.  These Care Teams include your primary Cardiologist (physician) and Advanced Practice Providers (APPs -  Physician Assistants and Nurse Practitioners) who all work together to provide you with the care you need, when you need it.  We recommend signing up for the patient portal called "MyChart".  Sign up information is provided on this After Visit Summary.  MyChart is used to connect with patients for Virtual Visits (Telemedicine).  Patients are able to view lab/test results, encounter notes, upcoming appointments, etc.  Non-urgent messages can be sent to your provider as well.   To learn more about what you can do with MyChart, go to [email or web address].    Your next appointment:   Structural Team will follow-up  Provider:   Tonny Bollman, MD

## 2022-06-05 NOTE — Progress Notes (Signed)
Cardiology Office Note:    Date:  06/08/2022   ID:  Glenn Hawkins, DOB 1955/09/29, MRN [id number]  PCP:  Glenn Hawkins   Glenn Hawkins Cardiologist:  Glenn Hawkins     Referring Hawkins: Glenn Hawkins   No chief complaint on file.   History of Present Illness:    Glenn Hawkins is a 67 y.o. male presenting for evaluation of mitral regurgitation, referred by Dr Glenn Hawkins.  The patient has a hx of ischemic cardiomyopathy and chronic systolic heart failure. However, he has been treated medically and has never undergone cardiac catheterization because of the presence of chronic kidney disease with concern over contrast nephropathy. He initially presented in 2013 with a late presenting inferior MI. He wasn't taken to the cath lab because of CKD and medical therapy was recommended. He has been anticoagulated since 2016 when he had extensive DVT. The patient was hospitalized twice with respiratory failure in March of this year, nearly requiring intubation during the first hospitalization. He was diuresed and treated for heart failure, and was found to have severe mitral regurgitation felt to be ischemic in etiology. HF treatment has been complicated by the presence of Stage IV CKD. He ultimately underwent TEE confirming severe ischemic MR secondary to posterior leaflet restriction with moderately severe segmental LV systolic dysfunction and LVEF of 35%.   The patient is here with his sister today. He has been trying to follow a low sodium diet, but it doesn't sound like he's been compliant with that in the past. He continues to stuggle with exertional dyspnea and fatigue. He doesn't currently have problems with orthopnea, PND, or edema. No lightheadedness or syncope. He denies chest pain or pressure. He reports that he's been feeling better since he doubled his lasix dose.   Past Medical History:  Diagnosis Date   Aortic insufficiency    a. 08/2019 Echo: mild to mod AI.   C.  difficile diarrhea 07/2020   CKD (chronic kidney disease) stage 3, GFR 30-59 ml/min    baseline Cr around 2.5   Combined systolic and diastolic congestive heart failure 05/06/2022   Coronary atherosclerosis of native coronary artery    a. 02/2011 Late presentation MI-->Myoview w/ large area of transmural infarct in LCX distribution, no ischemia.   Depressive disorder, not elsewhere classified    Esophageal reflux    Gout, unspecified    Hemorrhoids    a. 01/2016 s/p hemorrhoidectomy.   HFrEF (heart failure with reduced ejection fraction)    a. 04/2012 Echo: EF 40%, inflat AK, Gr1 DD, mild AI/MR, triv TR; b. 11/2017 EchoP EF 30-35%, inflat HK; c. 08/2019 Echo: EF 30-35%, gr1 DD, inflat AK.   History of DVT (deep vein thrombosis)    a. Chronic Eliquis.   Hyperlipidemia LDL goal <70    Hypertension    Hypotension, unspecified    Ischemic cardiomyopathy    a. 04/2012 Echo: EF 40%, inflat AK, Gr1 DD, mild AI/MR, triv TR; b. 11/2017 EchoP EF 30-35%, inflat HK; c. 08/2019 Echo: EF 30-35%, gr1 DD, inflat AK. Nl RV size/fxn. Mild MR. Mild to mod AI.   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 04/2012 Echo: EF 40%, mild MR.   Myocardial infarction (lateral wall) 2013   a. 02/2011 - late presentation. Managed conservatively 2/2 CKD;  b. 02/2011 Myoview: Large transmural infarct in the LCX distribution, no ischemia.   PAD (peripheral artery disease)    a. 08/2015 ABI/duplex: R: 0.86, L  0.96. Duplex w/ bilateral heterogeneous plaque in mid fem arteries, no significant stenoses; b. 08/2019 ABI/Duplex: prob bilat inflow dzs w/ stable ABIs (R 0.85, L 0.93).   Personal history of tobacco use, presenting hazards to health    Torn ligament    Unspecified schizophrenia, unspecified condition     Past Surgical History:  Procedure Laterality Date   BIOPSY  12/17/2020   Procedure: BIOPSY;  Surgeon: Glenn Hawkins;  Location: [facility];  Service: Endoscopy;;   COLONOSCOPY     COLONOSCOPY N/A 11/10/2014    Procedure: COLONOSCOPY;  Surgeon: Glenn Hawkins;  Location: [facility];  Service: Gastroenterology;  Laterality: N/A;   COLONOSCOPY WITH PROPOFOL N/A 05/08/2015   Surgeon: Glenn Hawkins; nonthrombosed external hemorrhoids, one 8 mm tubular adenoma, one 4 mm tubular adenoma.  Repeat colonoscopy in 5-10 years.   COLONOSCOPY WITH PROPOFOL N/A 12/17/2020   Procedure: COLONOSCOPY WITH PROPOFOL;  Surgeon: Glenn Hawkins;  Location: [facility];  Service: Endoscopy;  Laterality: N/A;  8:30am   ESOPHAGOGASTRODUODENOSCOPY (EGD) WITH PROPOFOL N/A 05/08/2015   Surgeon: Glenn Hawkins; LA grade a reflux esophagitis, normal stomach and duodenum.   ESOPHAGOGASTRODUODENOSCOPY (EGD) WITH PROPOFOL N/A 12/17/2020   Procedure: ESOPHAGOGASTRODUODENOSCOPY (EGD) WITH PROPOFOL;  Surgeon: Glenn Hawkins;  Location: [facility];  Service: Endoscopy;  Laterality: N/A;   EYE SURGERY Right    removal of foreign body   HEMORRHOID SURGERY N/A 02/20/2016   Procedure: EXTENSIVE HEMORRHOIDECTOMY;  Surgeon: Glenn Hawkins;  Location: [facility];  Service: General;  Laterality: N/A;   None     POLYPECTOMY  05/08/2015   Procedure: POLYPECTOMY;  Surgeon: Glenn Hawkins;  Location: [facility];  Service: Endoscopy;;  transverse colon polyp   TEE WITHOUT CARDIOVERSION N/A 05/26/2022   Procedure: TRANSESOPHAGEAL ECHOCARDIOGRAM (TEE);  Surgeon: Glenn Hawkins;  Location: [facility] ENDOSCOPY;  Service: Cardiovascular;  Laterality: N/A;    Current Medications: Current Meds  Medication Sig   apixaban (ELIQUIS) 2.5 MG TABS tablet Take 2.5 mg by mouth 2 (two) times daily.   brimonidine (ALPHAGAN) 0.2 % ophthalmic solution Place 1 drop into both eyes 3 (three) times daily.   colchicine 0.6 MG tablet Take 0.6 mg by mouth 2 (two) times daily.   Daridorexant HCl 25 MG TABS Take 25 mg by mouth at bedtime.   esomeprazole (NEXIUM) 20 MG capsule Take 1 capsule (20 mg total) by mouth daily before  breakfast.   isosorbide mononitrate (IMDUR) 30 MG 24 hr tablet Take 1 tablet (30 mg total) by mouth every morning. Patient needs appointment for further refills. 1 st attempt   latanoprost (XALATAN) 0.005 % ophthalmic solution Place 1 drop into both eyes at bedtime.   melatonin 3 MG TABS tablet Take 3 mg by mouth at bedtime.   metoprolol tartrate (LOPRESSOR) 100 MG tablet Take 100 mg by mouth 2 (two) times daily.   OLANZapine (ZYPREXA) 15 MG tablet Take 15 mg by mouth at bedtime.   OLANZapine (ZYPREXA) 5 MG tablet Take 5 mg by mouth daily.   risperiDONE (RISPERDAL) 1 MG tablet Take 1 tablet (1 mg total) by mouth at bedtime.   rosuvastatin (CRESTOR) 40 MG tablet Take 20 mg by mouth daily.   sertraline (ZOLOFT) 100 MG tablet Take 100 mg by mouth daily.   sodium bicarbonate 650 MG tablet Take 650 mg by mouth 2 (two) times daily.   [DISCONTINUED] allopurinol (ZYLOPRIM) 100 MG tablet Take 1  tablet (100 mg total) by mouth daily.   [DISCONTINUED] furosemide (LASIX) 40 MG tablet Take 1 tablet (40 mg total) by mouth daily.   [DISCONTINUED] nitroGLYCERIN (NITROSTAT) 0.4 MG SL tablet Place 1 tablet (0.4 mg total) under the tongue every 5 (five) minutes as needed for chest pain.     Allergies:   Nifedipine, Rabeprazole, Benazepril, Haloperidol, and Omeprazole   Social History   Socioeconomic History   Marital status: Divorced    Spouse name: Not on file   Number of children: Not on file   Years of education: Not on file   Highest education level: Not on file  Occupational History   Not on file  Tobacco Use   Smoking status: Former    Packs/day: 1.00    Years: 23.00    Additional pack years: 0.00    Total pack years: 23.00    Types: Cigarettes    Quit date: 02/25/1996    Years since quitting: 26.3   Smokeless tobacco: Never   Tobacco comments:    + 15 years of smoking  Vaping Use   Vaping Use: Never used  Substance and Sexual Activity   Alcohol use: No    Comment: Daily alcohol use 20+  years ago   Drug use: Not Currently    Types: Cocaine    Comment: History of intranasal cocaine and speed 20+ years ago.   Sexual activity: Not Currently    Birth control/protection: None  Other Topics Concern   Not on file  Social History Narrative   Not on file   Social Determinants of Health   Financial Resource Strain: Not on file  Food Insecurity: No Food Insecurity (05/07/2022)   Hunger Vital Sign    Worried About Running Out of Food in the Last Year: Never true    Ran Out of Food in the Last Year: Never true  Transportation Needs: No Transportation Needs (05/07/2022)   PRAPARE - Administrator, Civil Service (Medical): No    Lack of Transportation (Non-Medical): No  Physical Activity: Not on file  Stress: Not on file  Social Connections: Not on file     Family History: The patient's family history includes Crohn's disease in his sister. There is no history of Colon cancer.  ROS:   Please see the history of present illness.    All other systems reviewed and are negative.  EKGs/Labs/Other Studies Reviewed:    The following studies were reviewed today: Cardiac Studies & Procedures       ECHOCARDIOGRAM  ECHOCARDIOGRAM COMPLETE 05/07/2022  Narrative ECHOCARDIOGRAM REPORT    Patient Name:   OZ GAMMEL Date of Exam: 05/07/2022 Medical Rec #:  [id number]      Height:       68.0 in Accession #:    [id number]     Weight:       176.8 lb Date of Birth:  08-Jun-1955       BSA:          1.939 m Patient Age:    66 years       BP:           154/89 mmHg Patient Gender: M              HR:           89 bpm. Exam Location:  Inpatient  Procedure: 2D Echo, Color Doppler and Cardiac Doppler  Indications:    I50.21 Acute systolic (congestive) heart failure  History:  Patient has prior history of Echocardiogram examinations, most recent 09/15/2019. CHF, PAD; Risk Factors:Hypertension and Dyslipidemia.  Sonographer:    Irving Burton Senior RDCS Referring Phys:  [phone] RONDELL A SMITH  IMPRESSIONS   1. Left ventricular ejection fraction, by estimation, is 35 to 40%. The left ventricle has moderately decreased function. The left ventricle demonstrates regional wall motion abnormalities (see scoring diagram/findings for description). The left ventricular internal cavity size was moderately to severely dilated. Left ventricular diastolic parameters are indeterminate. 2. Right ventricular systolic function is mildly reduced. The right ventricular size is normal. There is mildly elevated pulmonary artery systolic pressure. The estimated right ventricular systolic pressure is 41.2 mmHg. 3. Left atrial size was severely dilated. 4. A small pericardial effusion is present. The pericardial effusion is localized near the right atrium. 5. 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 There is splay artifact. The mitral valve is grossly normal. Severe mitral valve regurgitation. No evidence of mitral stenosis. 6. The aortic valve is tricuspid. There is mild calcification of the aortic valve. Aortic valve regurgitation is mild to moderate. No aortic stenosis is present. 7. The inferior vena cava is dilated in size with >50% respiratory variability, suggesting right atrial pressure of 8 mmHg.  FINDINGS Left Ventricle: Left ventricular ejection fraction, by estimation, is 35 to 40%. The left ventricle has moderately decreased function. The left ventricle demonstrates regional wall motion abnormalities. The left ventricular internal cavity size was moderately to severely dilated. There is no left ventricular hypertrophy. Left ventricular diastolic function could not be evaluated due to mitral regurgitation (moderate or greater). Left ventricular diastolic parameters are indeterminate.   LV Wall Scoring: The mid and distal lateral wall and mid anterolateral segment are  akinetic. The entire anterior wall, basal anterolateral segment, and mid inferior segment are hypokinetic.  Right Ventricle: The right ventricular size is normal. No increase in right ventricular wall thickness. Right ventricular systolic function is mildly reduced. There is mildly elevated pulmonary artery systolic pressure. The tricuspid regurgitant velocity is 2.88 m/s, and with an assumed right atrial pressure of 8 mmHg, the estimated right ventricular systolic pressure is 41.2 mmHg.  Left Atrium: Left atrial size was severely dilated.  Right Atrium: Right atrial size was normal in size.  Pericardium: A small pericardial effusion is present. The pericardial effusion is localized near the right atrium.  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 There is splay artifact. The mitral valve is grossly normal. Severe mitral valve regurgitation. No evidence of mitral valve stenosis.  Tricuspid Valve: The tricuspid valve is normal in structure. Tricuspid valve regurgitation is mild . No evidence of tricuspid stenosis.  Aortic Valve: The aortic valve is tricuspid. There is mild calcification of the aortic valve. Aortic valve regurgitation is mild to moderate. No aortic stenosis is present.  Pulmonic Valve: The pulmonic valve was normal in structure. Pulmonic valve regurgitation is mild. No evidence of pulmonic stenosis.  Aorta: The aortic root is normal in size and structure.  Venous: The inferior vena cava is dilated in size with greater than 50% respiratory variability, suggesting right atrial pressure of 8 mmHg.  IAS/Shunts: No atrial level shunt detected by color flow Doppler.   LEFT VENTRICLE PLAX 2D LVIDd:         6.90 cm      Diastology LVIDs:  5.50 cm      LV e' medial:    5.55 cm/s LV PW:         1.00 cm      LV E/e' medial:  22.3 LV IVS:        0.80 cm      LV e'  lateral:   9.36 cm/s LVOT diam:     2.40 cm      LV E/e' lateral: 13.2 LV SV:         106 LV SV Index:   55 LVOT Area:     4.52 cm  LV Volumes (MOD) LV vol d, MOD A2C: 200.0 ml LV vol d, MOD A4C: 180.0 ml LV vol s, MOD A2C: 127.0 ml LV vol s, MOD A4C: 115.0 ml LV SV MOD A2C:     73.0 ml LV SV MOD A4C:     180.0 ml LV SV MOD BP:      64.7 ml  RIGHT VENTRICLE RV S prime:     14.60 cm/s TAPSE (M-mode): 2.1 cm  LEFT ATRIUM              Index        RIGHT ATRIUM           Index LA diam:        5.00 cm  2.58 cm/m   RA Area:     17.80 cm LA Vol (A2C):   94.1 ml  48.52 ml/m  RA Volume:   50.40 ml  25.99 ml/m LA Vol (A4C):   101.0 ml 52.08 ml/m LA Biplane Vol: 97.6 ml  50.32 ml/m AORTIC VALVE LVOT Vmax:   112.00 cm/s LVOT Vmean:  77.700 cm/s LVOT VTI:    0.235 m  AORTA Ao Root diam: 3.40 cm Ao Asc diam:  3.50 cm  MITRAL VALVE                  TRICUSPID VALVE MV Area (PHT): 3.34 cm       TR Peak grad:   33.2 mmHg MV Decel Time: 227 msec       TR Vmax:        288.00 cm/s MR Peak grad:    141.4 mmHg MR Mean grad:    102.5 mmHg   SHUNTS MR Vmax:         594.50 cm/s  Systemic VTI:  0.24 m MR Vmean:        485.5 cm/s   Systemic Diam: 2.40 cm MR PISA:         1.90 cm MR PISA Eff ROA: 12 mm MR PISA Radius:  0.55 cm MV E velocity: 124.00 cm/s MV A velocity: 71.70 cm/s MV E/A ratio:  1.73  Weston BrassGayatri Acharya Hawkins Electronically signed by Weston BrassGayatri Acharya Hawkins Signature Date/Time: 05/07/2022/11:05:55 AM    Final   TEE  ECHO TEE 05/26/2022  Narrative TRANSESOPHOGEAL ECHO REPORT    Patient Name:   Glenn HeraldANTHONY V Tays Date of Exam: 05/26/2022 Medical Rec #:  [id number]      Height:       68.0 in Accession #:    [id number]     Weight:       186.6 lb Date of Birth:  07/21/1955       BSA:          1.984 m Patient Age:    66 years       BP:           134/81 mmHg Patient  Gender: M              HR:           76 bpm. Exam Location:  Outpatient  Procedure: 3D Echo, Transesophageal  Echo, Color Doppler and Cardiac Doppler  Indications:     I34.0 Nonrheumatic mitral (valve) insufficiency  History:         Patient has prior history of Echocardiogram examinations, most recent 05/07/2022. Cardiomyopathy and CHF, CAD, Mitral Valve Disease; Risk Factors:Hypertension and Dyslipidemia.  Sonographer:     Milbert Coulter Referring Phys:  [id number] Lisette Abu HILTY Diagnosing Phys: Charlton Haws Hawkins  PROCEDURE: After discussion of the risks and benefits of a TEE, an informed consent was obtained from the patient. The transesophogeal probe was passed without difficulty through the esophogus of the patient. Imaged were obtained with the patient in a left lateral decubitus position. Sedation performed by different physician. The patient was monitored while under deep sedation. Anesthestetic sedation was provided intravenously by Anesthesiology: 70mg  of Propofol. Image quality was good. The patient's vital signs; including heart rate, blood pressure, and oxygen saturation; remained stable throughout the procedure. The patient developed no complications during the procedure.  IMPRESSIONS   1. Posterior lateral hypokinesis . Left ventricular ejection fraction, by estimation, is 35 to 40%. The left ventricle has moderately decreased function. The left ventricle demonstrates regional wall motion abnormalities (see scoring diagram/findings for description). The left ventricular internal cavity size was moderately dilated. 2. Right ventricular systolic function is mildly reduced. The right ventricular size is mildly enlarged. 3. Left atrial size was severely dilated. No left atrial/left atrial appendage thrombus was detected. 4. Right atrial size was moderately dilated. 5. Severe ischemic MR two jets betwen A2/P2 suitable leaflet length, gradients and MVA for clip . The mitral valve is abnormal. Severe mitral valve regurgitation. No evidence of mitral stenosis. 6. Tricuspid valve regurgitation is  moderate. 7. The aortic valve is tricuspid. There is mild calcification of the aortic valve. There is mild thickening of the aortic valve. Aortic valve regurgitation is mild. Aortic valve sclerosis is present, with no evidence of aortic valve stenosis.  FINDINGS Left Ventricle: Posterior lateral hypokinesis. Left ventricular ejection fraction, by estimation, is 35 to 40%. The left ventricle has moderately decreased function. The left ventricle demonstrates regional wall motion abnormalities. The left ventricular internal cavity size was moderately dilated. There is no left ventricular hypertrophy.  Right Ventricle: The right ventricular size is mildly enlarged. Right vetricular wall thickness was not assessed. Right ventricular systolic function is mildly reduced.  Left Atrium: Left atrial size was severely dilated. No left atrial/left atrial appendage thrombus was detected.  Right Atrium: Right atrial size was moderately dilated.  Pericardium: There is no evidence of pericardial effusion.  Mitral Valve: Severe ischemic MR two jets betwen A2/P2 suitable leaflet length, gradients and MVA for clip. The mitral valve is abnormal. Severe mitral valve regurgitation. No evidence of mitral valve stenosis. MV peak gradient, 5.7 mmHg. The mean mitral valve gradient is 2.0 mmHg.  Tricuspid Valve: The tricuspid valve is normal in structure. Tricuspid valve regurgitation is moderate.  Aortic Valve: The aortic valve is tricuspid. There is mild calcification of the aortic valve. There is mild thickening of the aortic valve. Aortic valve regurgitation is mild. Aortic valve sclerosis is present, with no evidence of aortic valve stenosis.  Pulmonic Valve: The pulmonic valve was normal in structure. Pulmonic valve regurgitation is mild.  Aorta: The aortic root is normal in size and structure.  IAS/Shunts: No atrial level shunt detected by color flow Doppler.  Additional Comments: Spectral Doppler  performed.  MITRAL VALVE                  TRICUSPID VALVE MV Area (PHT): 3.99 cm       TR Peak grad:   42.5 mmHg MV Peak grad:  5.7 mmHg       TR Vmax:        326.00 cm/s MV Mean grad:  2.0 mmHg MV Vmax:       1.19 m/s MV Vmean:      59.2 cm/s MV Decel Time: 190 msec MR Peak grad:    105.3 mmHg MR Mean grad:    77.0 mmHg MR Vmax:         513.00 cm/s MR Vmean:        423.0 cm/s MR PISA:         5.09 cm MR PISA Eff ROA: 33 mm MR PISA Radius:  0.90 cm MV E velocity: 96.20 cm/s  Charlton Haws Hawkins Electronically signed by Charlton Haws Hawkins Signature Date/Time: 05/26/2022/11:56:13 AM    Final             EKG:  Most recent EKG is reviewed from 05/26/22 showing NSR, nonspecific T wave abnormality  Recent Labs: 05/06/2022: B Natriuretic Peptide 1,699.1; TSH 1.413 05/07/2022: ALT 19 05/08/2022: BUN 45; Creatinine, Ser 3.45; Hemoglobin 7.7; Platelets 77; Potassium 3.7; Sodium 141  Recent Lipid Panel    Component Value Date/Time   CHOL 191 08/02/2019 1515   TRIG 175 (H) 08/02/2019 1515   HDL 37 (L) 08/02/2019 1515   CHOLHDL 5.2 (H) 08/02/2019 1515   CHOLHDL 4.2 11/09/2014 0525   VLDL 23 11/09/2014 0525   LDLCALC 123 (H) 08/02/2019 1515   LDLDIRECT 125 (H) 08/02/2019 1515     Risk Assessment/Calculations:          Physical Exam:    VS:  BP (!) 130/90   Pulse (!) 55   Ht 5\' 8"  (1.727 m)   Wt 182 lb 3.2 oz (82.6 kg)   SpO2 99%   BMI 27.70 kg/m     Wt Readings from Last 3 Encounters:  06/05/22 182 lb 3.2 oz (82.6 kg)  05/26/22 175 lb (79.4 kg)  05/16/22 186 lb 9.6 oz (84.6 kg)     GEN:  Well nourished, well developed in no acute distress HEENT: Normal NECK: No JVD; No carotid bruits LYMPHATICS: No lymphadenopathy CARDIAC: RRR, 2/6 holosystolic murmur at the LLSB and apex RESPIRATORY:  Clear to auscultation without rales, wheezing or rhonchi  ABDOMEN: Soft, non-tender, non-distended MUSCULOSKELETAL:  No edema; No deformity  SKIN: Warm and dry NEUROLOGIC:  Alert  and oriented x 3 PSYCHIATRIC:  Normal affect   ASSESSMENT:    1. Nonrheumatic mitral (valve) insufficiency    PLAN:    In order of problems listed above:  The patient has severe, ischemic mitral regurgitation with LA dilatation, restriction of the posterior leaflet of the mitral valve, and moderately severe LV dysfunction. The patient has NYHA functional class 3 symptoms of shortness of breath consistent with chronic systolic heart failure. His case is complicated by the presence of CAD and old MI with unknown coronary anatomy, stage IV chronic kidney disease (previously declining placement of an AV fistula), anemia with most recent HgB 7.7 mg/dL, peripheral arterial disease, and schizophrenia. I have reviewed the natural history of mitral regurgitation with the patient and their family members who are present today.  We have discussed the limitations of medical therapy and the poor prognosis associated with symptomatic mitral regurgitation. We have also reviewed potential treatment options, including palliative medical therapy, conventional surgical mitral valve repair or replacement, and percutaneous mitral valve therapies such as edge-to-edge mitral valve approximation with MitraClip. We discussed treatment options in the context of this patient's specific comorbid medical conditions outlined above. In reviewing his echo and TEE images, I think he has suitable for transcatheter edge-to-edge repair of the mitral valve with either MitraClip or Pascal and he clearly has severe mitral regurgitation that warrants treatment. I advised the patient and his sister that we will review his case with our multidisciplinary heart valve team and determine appropriate further workup and treatment. Ideally he should have cardiac catheterization as the etiology of his MR is ischemic and he is at risk of multivessel CAD. This would come with risk of AKI in the background of his advanced kidney disease. Alternatively, we  could consider proceeding without evaluating his coronary arteries to avoid the risk to his kidneys, but I am reluctant to Hawkins that. We may also need to involve nephrology in this discussion. I will follow-up with them once we review his case. As part of his evaluation today, I reviewed the TEER procedure with them, and discussed the typical risks and expected recovery. Will reach back out to his sister once we discuss his case.            Medication Adjustments/Labs and Tests Ordered: Current medicines are reviewed at length with the patient today.  Concerns regarding medicines are outlined above.  No orders of the defined types were placed in this encounter.  Meds ordered this encounter  Medications   furosemide (LASIX) 40 MG tablet    Sig: Take 1 tablet (40 mg total) by mouth 2 (two) times daily.    Dispense:  180 tablet    Refill:  3   nitroGLYCERIN (NITROSTAT) 0.4 MG SL tablet    Sig: Dissolve 1 tablet under the tongue every 5 minutes as needed for chest pain. Max of 3 doses, then 911.    Dispense:  25 tablet    Refill:  6    Patient Instructions  Medication Instructions:  CONTINUE Furosemide 40mg  twice daily REFILLED Nitroglycerin *If you need a refill on your cardiac medications before your next appointment, please call your pharmacy*   Lab Work: NONE If you have labs (blood work) drawn today and your tests are completely normal, you will receive your results only by: MyChart Message (if you have MyChart) OR A paper copy in the mail If you have any lab test that is abnormal or we need to change your treatment, we will call you to review the results.   Testing/Procedures: NONE   Follow-Up: At [facility], you and your health needs are our priority.  As part of our continuing mission to provide you with exceptional heart care, we have created designated Provider Care Teams.  These Care Teams include your primary Cardiologist (physician) and Advanced Practice  Hawkins (APPs -  Physician Assistants and Nurse Practitioners) who all work together to provide you with the care you need, when you need it.  We recommend signing up for the patient portal called "MyChart".  Sign up information is provided on this After Visit Summary.  MyChart is used to connect with patients for Virtual Visits (Telemedicine).  Patients are able to view lab/test results, encounter notes, upcoming appointments, etc.  Non-urgent messages can be sent  to your provider as well.   To learn more about what you can Hawkins with MyChart, go to [email or web address].    Your next appointment:   Structural Team will follow-up  Provider:   Tonny Bollman, Hawkins     Signed, Tonny Bollman, Hawkins  06/08/2022 5:09 PM    [facility]

## 2022-06-09 DIAGNOSIS — D631 Anemia in chronic kidney disease: Secondary | ICD-10-CM | POA: Diagnosis not present

## 2022-06-09 DIAGNOSIS — R809 Proteinuria, unspecified: Secondary | ICD-10-CM | POA: Diagnosis not present

## 2022-06-09 DIAGNOSIS — N184 Chronic kidney disease, stage 4 (severe): Secondary | ICD-10-CM | POA: Diagnosis not present

## 2022-06-09 DIAGNOSIS — D638 Anemia in other chronic diseases classified elsewhere: Secondary | ICD-10-CM | POA: Diagnosis not present

## 2022-06-09 DIAGNOSIS — D509 Iron deficiency anemia, unspecified: Secondary | ICD-10-CM | POA: Diagnosis not present

## 2022-06-09 DIAGNOSIS — Z79899 Other long term (current) drug therapy: Secondary | ICD-10-CM | POA: Diagnosis not present

## 2022-06-13 DIAGNOSIS — I129 Hypertensive chronic kidney disease with stage 1 through stage 4 chronic kidney disease, or unspecified chronic kidney disease: Secondary | ICD-10-CM | POA: Diagnosis not present

## 2022-06-13 DIAGNOSIS — N184 Chronic kidney disease, stage 4 (severe): Secondary | ICD-10-CM | POA: Diagnosis not present

## 2022-06-13 DIAGNOSIS — D638 Anemia in other chronic diseases classified elsewhere: Secondary | ICD-10-CM | POA: Diagnosis not present

## 2022-06-13 DIAGNOSIS — E211 Secondary hyperparathyroidism, not elsewhere classified: Secondary | ICD-10-CM | POA: Diagnosis not present

## 2022-06-19 ENCOUNTER — Telehealth: Payer: Self-pay | Admitting: Cardiovascular Disease

## 2022-06-19 DIAGNOSIS — I5022 Chronic systolic (congestive) heart failure: Secondary | ICD-10-CM

## 2022-06-19 DIAGNOSIS — N184 Chronic kidney disease, stage 4 (severe): Secondary | ICD-10-CM

## 2022-06-19 DIAGNOSIS — Z01810 Encounter for preprocedural cardiovascular examination: Secondary | ICD-10-CM

## 2022-06-19 NOTE — Telephone Encounter (Signed)
  Per MyChart Scheduling message:  My name is Jae Dire, sister of Harold Moncus. Dr. [name] had left me a telephone message Monday, 4/21, regarding the teams decicision about my brother's healthcare situation. I was in [place] but have returned home and I am trying to respond to his call. I am home and can be reached anytime at this number [phone].

## 2022-06-20 NOTE — Telephone Encounter (Signed)
Per OV note on 06/05/22: In reviewing his echo and TEE images, I think he has suitable for transcatheter edge-to-edge repair of the mitral valve with either MitraClip or Pascal and he clearly has severe mitral regurgitation that warrants treatment. I advised the patient and his sister that we will review his case with our multidisciplinary heart valve team and determine appropriate further workup and treatment. Ideally he should have cardiac catheterization as the etiology of his MR is ischemic and he is at risk of multivessel CAD. This would come with risk of AKI in the background of his advanced kidney disease. Alternatively, we could consider proceeding without evaluating his coronary arteries to avoid the risk to his kidneys, but I am reluctant to do that. We may also need to involve nephrology in this discussion.   Routing to [employer] and structural team to ensure that next step should be cardiac catheterization and if I should go ahead and arrange that

## 2022-06-22 DIAGNOSIS — R0600 Dyspnea, unspecified: Secondary | ICD-10-CM | POA: Diagnosis not present

## 2022-06-22 DIAGNOSIS — Z87891 Personal history of nicotine dependence: Secondary | ICD-10-CM | POA: Diagnosis not present

## 2022-06-22 DIAGNOSIS — I509 Heart failure, unspecified: Secondary | ICD-10-CM | POA: Diagnosis not present

## 2022-06-22 DIAGNOSIS — I110 Hypertensive heart disease with heart failure: Secondary | ICD-10-CM | POA: Diagnosis not present

## 2022-06-22 DIAGNOSIS — I252 Old myocardial infarction: Secondary | ICD-10-CM | POA: Diagnosis not present

## 2022-06-22 DIAGNOSIS — R0602 Shortness of breath: Secondary | ICD-10-CM | POA: Diagnosis not present

## 2022-06-23 ENCOUNTER — Encounter: Payer: Self-pay | Admitting: Cardiovascular Disease

## 2022-06-23 DIAGNOSIS — J9601 Acute respiratory failure with hypoxia: Secondary | ICD-10-CM | POA: Diagnosis not present

## 2022-06-23 DIAGNOSIS — F200 Paranoid schizophrenia: Secondary | ICD-10-CM | POA: Diagnosis not present

## 2022-06-23 DIAGNOSIS — R509 Fever, unspecified: Secondary | ICD-10-CM | POA: Diagnosis not present

## 2022-06-23 DIAGNOSIS — N184 Chronic kidney disease, stage 4 (severe): Secondary | ICD-10-CM | POA: Diagnosis not present

## 2022-06-23 DIAGNOSIS — D631 Anemia in chronic kidney disease: Secondary | ICD-10-CM | POA: Diagnosis not present

## 2022-06-23 DIAGNOSIS — I429 Cardiomyopathy, unspecified: Secondary | ICD-10-CM | POA: Diagnosis not present

## 2022-06-23 NOTE — Telephone Encounter (Signed)
Per Structural Heart discussion, the patient will need to have [facility] and CHF visit if proceeding with [facility]ER.  Dr. [name] called the patient's sister last week to discuss risks/benefits/plan and left a message - she had been out of the country for several weeks. Will route to Dr. [name]. Jae Dire [phone]

## 2022-06-24 NOTE — Telephone Encounter (Signed)
Per Dr [name], we need to proceed with [facility] and have pt seen by HF clinic. Called and spoke with sister Eunice Blase to arrange cath. Cath scheduled for Thursday, May 9 w/Cooper at 1pm. Updated labs and EKG scheduled for 06/27/22. Pt likely to need some hydration prior to cath, awaiting final recommendation by [name], then will send cath instructions with arrival time via MyChart. Referral placed to HF clinic at this time.

## 2022-06-24 NOTE — Telephone Encounter (Signed)
Per verbal order of Dr [name], he does wish for pt to receive 4 hours of IV fluid prior to cath. Cath lab made aware and instructions sent to patient (and sister) via MyChart.

## 2022-06-26 ENCOUNTER — Telehealth (HOSPITAL_COMMUNITY): Payer: Self-pay | Admitting: Vascular Surgery

## 2022-06-26 ENCOUNTER — Other Ambulatory Visit: Payer: Medicare Other

## 2022-06-26 ENCOUNTER — Ambulatory Visit: Payer: Medicare Other

## 2022-06-26 NOTE — Telephone Encounter (Signed)
Mailbox is full w/ try back to make new chf appt

## 2022-06-27 ENCOUNTER — Ambulatory Visit: Payer: Medicare Other

## 2022-06-27 ENCOUNTER — Ambulatory Visit: Payer: Medicare Other | Attending: Cardiology

## 2022-06-27 VITALS — BP 132/94 | HR 90 | Resp 16 | Ht 68.0 in | Wt 197.8 lb

## 2022-06-27 DIAGNOSIS — I5022 Chronic systolic (congestive) heart failure: Secondary | ICD-10-CM | POA: Insufficient documentation

## 2022-06-27 DIAGNOSIS — N184 Chronic kidney disease, stage 4 (severe): Secondary | ICD-10-CM | POA: Insufficient documentation

## 2022-06-27 DIAGNOSIS — Z01810 Encounter for preprocedural cardiovascular examination: Secondary | ICD-10-CM

## 2022-06-27 NOTE — Addendum Note (Signed)
Encounter addended by: Howell Rucks, RDCS on: 06/27/2022 7:24 AM  Actions taken: Imaging Exam ended

## 2022-06-27 NOTE — Progress Notes (Unsigned)
   Nurse Visit   Date of Encounter: 06/27/2022 ID: Glenn Hawkins, DOB May 14, 1955, MRN [id number]  PCP:  [name], MD   [facility] Providers Cardiologist:  [name], MD { Click to update primary MD,subspecialty MD or APP then REFRESH:1}     Visit Details   VS:  BP (!) 132/94 (BP Location: Left Arm, Patient Position: Sitting, Cuff Size: Normal)   Pulse 90   Resp 16   Ht 5\' 8"  (1.727 m)   Wt 197 lb 12.8 oz (89.7 kg)   SpO2 98%   BMI 30.08 kg/m  , BMI Body mass index is 30.08 kg/m.  Wt Readings from Last 3 Encounters:  06/27/22 197 lb 12.8 oz (89.7 kg)  06/05/22 182 lb 3.2 oz (82.6 kg)  05/26/22 175 lb (79.4 kg)     Reason for visit: EKG Performed today: Vitals, EKG, Provider consulted:Dr. Lalla Brothers, and Education Changes (medications, testing, etc.) : No changes today Length of Visit: 15 minutes    Medications Adjustments/Labs and Tests Ordered: No orders of the defined types were placed in this encounter.  No orders of the defined types were placed in this encounter.    Signed, Festus Holts, RN  06/27/2022 3:42 PM

## 2022-06-28 LAB — CBC
Hematocrit: 33.9 % — ABNORMAL LOW (ref 37.5–51.0)
Hemoglobin: 10.5 g/dL — ABNORMAL LOW (ref 13.0–17.7)
MCH: 29.2 pg (ref 26.6–33.0)
MCHC: 31 g/dL — ABNORMAL LOW (ref 31.5–35.7)
MCV: 94 fL (ref 79–97)
Platelets: 114 10*3/uL — ABNORMAL LOW (ref 150–450)
RBC: 3.59 x10E6/uL — ABNORMAL LOW (ref 4.14–5.80)
RDW: 15.1 % (ref 11.6–15.4)
WBC: 4.4 10*3/uL (ref 3.4–10.8)

## 2022-06-28 LAB — BASIC METABOLIC PANEL
BUN/Creatinine Ratio: 8 — ABNORMAL LOW (ref 10–24)
BUN: 29 mg/dL — ABNORMAL HIGH (ref 8–27)
CO2: 22 mmol/L (ref 20–29)
Calcium: 8.4 mg/dL — ABNORMAL LOW (ref 8.6–10.2)
Chloride: 108 mmol/L — ABNORMAL HIGH (ref 96–106)
Creatinine, Ser: 3.47 mg/dL — ABNORMAL HIGH (ref 0.76–1.27)
Glucose: 116 mg/dL — ABNORMAL HIGH (ref 70–99)
Potassium: 3.3 mmol/L — ABNORMAL LOW (ref 3.5–5.2)
Sodium: 147 mmol/L — ABNORMAL HIGH (ref 134–144)
eGFR: 19 mL/min/{1.73_m2} — ABNORMAL LOW (ref >59)

## 2022-07-01 NOTE — Progress Notes (Unsigned)
Glenn Hawkins, male    DOB: 22-Jun-1955   MRN: [id number]   Brief patient profile:  66  yobm  quit smoking 1998 s resp cc with onset sob around 2016  referred to pulmonary clinic in [facility]  05/16/2022 by Tanja Port NP for R effusion assoc with breathing bad to worse toward end 2023      Admitted [facility] then [facility] x 2 weeks March 2024   History of Present Illness  05/16/2022  Pulmonary/ 1st office eval/ Sherene Sires / [facility]  Chief Complaint  Patient presents with   Consult    ED for Pleural Effusion. SOB with exertion. Some wheezing. Cough with white sputum.  Dyspnea:  hc parking, now doe slow pace MMRC3 = can't walk 100 yards even at a slow pace at a flat grade s stopping due to sob   Cough: none  Sleep: flat bed 2 pillows  SABA use: none  Rec Make sure you check your oxygen saturation at your highest level of activity(NOT after you stop)  to be sure it stays over 90%     07/02/2022  f/u ov/[place] office/Thelma Lorenzetti re: doe with systolic dysfunction and severe MR for L/R cath 07/03/22  maint on no resp rx   Chief Complaint  Patient presents with   Follow-up    Pt f/u states that he is feeling fine no questions or concerns at this time   Dyspnea:  slow pace / very sedentary does not shop as much / fm does grocery store  Cough: none  Sleeping: flat 3 pillows 2-3 pnd / recliner does not help orthopnea/ pnd SABA use: none  02: none    No obvious day to day or daytime variability or assoc excess/ purulent sputum or mucus plugs or hemoptysis or cp or chest tightness, subjective wheeze or overt sinus or hb symptoms.     Also denies any obvious fluctuation of symptoms with weather or environmental changes or other aggravating or alleviating factors except as outlined above   No unusual exposure hx or h/o childhood pna/ asthma or knowledge of premature birth.  Current Allergies, Complete Past Medical History, Past Surgical History, Family History, and Social History were reviewed in  [employer] record.  ROS  The following are not active complaints unless bolded Hoarseness, sore throat, dysphagia, dental problems, itching, sneezing,  nasal congestion or discharge of excess mucus or purulent secretions, ear ache,   fever, chills, sweats, unintended wt loss or wt gain, classically pleuritic or exertional cp,  orthopnea pnd or arm/hand swelling  or leg swelling, presyncope, palpitations, abdominal pain, anorexia, nausea, vomiting, diarrhea  or change in bowel habits or change in bladder habits, change in stools or change in urine, dysuria, hematuria,  rash, arthralgias, visual complaints, headache, numbness, weakness or ataxia or problems with walking or coordination,  change in mood or  memory.          Current Meds  Medication Sig   apixaban (ELIQUIS) 5 MG TABS tablet Take 2.5 mg by mouth 2 (two) times daily.   aspirin EC 81 MG tablet Take 81 mg by mouth daily. Swallow whole.   brimonidine (ALPHAGAN) 0.2 % ophthalmic solution Place 1 drop into both eyes daily.   calcitRIOL (ROCALTROL) 0.25 MCG capsule Take 0.25 mcg by mouth every Monday, Wednesday, and Friday.   colchicine 0.6 MG tablet Take 0.6 mg by mouth 2 (two) times daily.   Daridorexant HCl 25 MG TABS Take 25 mg by mouth at  bedtime.   DOCUSATE SODIUM PO Take 1 capsule by mouth daily.   esomeprazole (NEXIUM) 20 MG capsule Take 1 capsule (20 mg total) by mouth daily before breakfast.   furosemide (LASIX) 40 MG tablet Take 1 tablet (40 mg total) by mouth 2 (two) times daily. (Patient taking differently: Take 40 mg by mouth 3 (three) times daily.)   isosorbide mononitrate (IMDUR) 30 MG 24 hr tablet Take 1 tablet (30 mg total) by mouth every morning. Patient needs appointment for further refills. 1 st attempt   latanoprost (XALATAN) 0.005 % ophthalmic solution Place 1 drop into both eyes at bedtime.   melatonin 3 MG TABS tablet Take 3 mg by mouth at bedtime.   metoprolol tartrate (LOPRESSOR) 100 MG  tablet Take 100 mg by mouth 2 (two) times daily.   nitroGLYCERIN (NITROSTAT) 0.4 MG SL tablet Dissolve 1 tablet under the tongue every 5 minutes as needed for chest pain. Max of 3 doses, then 911.   OLANZapine (ZYPREXA) 10 MG tablet Take 5-15 mg by mouth See admin instructions. Take 5 mg in the morning and 15 mg at night   risperiDONE (RISPERDAL) 1 MG tablet Take 1 tablet (1 mg total) by mouth at bedtime.   rosuvastatin (CRESTOR) 40 MG tablet Take 20 mg by mouth daily.   sertraline (ZOLOFT) 100 MG tablet Take 100 mg by mouth daily.   sodium bicarbonate 650 MG tablet Take 650 mg by mouth 2 (two) times daily.             Past Medical History:  Diagnosis Date   Aortic insufficiency    a. 08/2019 Echo: mild to mod AI.   C. 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 02/2011 Late presentation MI-->Myoview w/ large area of transmural infarct in LCX distribution, no ischemia.   Depressive disorder, not elsewhere classified    Esophageal reflux    Gout, unspecified    Hemorrhoids    a. 01/2016 s/p hemorrhoidectomy.   HFrEF (heart failure with reduced ejection fraction) (HCC)    a. 04/2012 Echo: EF 40%, inflat AK, Gr1 DD, mild AI/MR, triv TR; b. 11/2017 EchoP EF 30-35%, inflat HK; c. 08/2019 Echo: EF 30-35%, gr1 DD, inflat AK.   History of DVT (deep vein thrombosis)    a. Chronic Eliquis.   Hyperlipidemia LDL goal <70    Hypertension    Hypotension, unspecified    Ischemic cardiomyopathy    a. 04/2012 Echo: EF 40%, inflat AK, Gr1 DD, mild AI/MR, triv TR; b. 11/2017 EchoP EF 30-35%, inflat HK; c. 08/2019 Echo: EF 30-35%, gr1 DD, inflat AK. Nl RV size/fxn. Mild MR. Mild to mod AI.   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 04/2012 Echo: EF 40%, mild MR.   Myocardial infarction (lateral wall) (HCC)  2013   a. 02/2011 - late presentation. Managed conservatively 2/2 CKD;  b. 02/2011 Myoview: Large transmural infarct in the LCX distribution, no ischemia.   PAD (peripheral artery disease) (HCC)    a. 08/2015 ABI/duplex: R: 0.86, L 0.96. Duplex w/ bilateral heterogeneous plaque in mid fem arteries, no significant stenoses; b. 08/2019 ABI/Duplex: prob bilat inflow dzs w/ stable ABIs (R 0.85, L 0.93).   Personal history of tobacco use, presenting hazards to health    Torn ligament  Unspecified schizophrenia, unspecified condition       Objective:    Wts   07/02/2022         200   06/27/22 197 lb 12.8 oz (89.7 kg)  06/05/22 182 lb 3.2 oz (82.6 kg)  05/26/22 175 lb (79.4 kg)      Vital signs reviewed  07/02/2022  - Note at rest 02 sats  97% on RA   General appearance:    pleasant amb bm nad    HEENT : Oropharynx  clear/ full dentures          NECK :  without  apparent JVD/ palpable Nodes/TM    LUNGS: no acc muscle use,  Nl contour chest which is clear to A and P bilaterally without cough on insp or exp maneuvers   CV:  RRR   2/6 SEM/ 1/6 Diastolic M c slt increase in P2, and trace ankle edema  ABD:  soft and nontender with nl inspiratory excursion in the supine position. No bruits or organomegaly appreciated   MS:  Nl gait/ ext warm without deformities Or obvious joint restrictions  calf tenderness, cyanosis or clubbing    SKIN: warm and dry without lesions    NEURO:  alert, approp, nl sensorium with  no motor or cerebellar deficits apparent.        I personally reviewed images and agree with radiology impression as follows:  CXR:   pa and lateral  05/06/22 1. Cardiomegaly with pulmonary vascular congestion. 2. Interstitial and airspace opacities in the lungs bilaterally, possible edema or infiltrate, and improved from the prior exam. 3. Small bilateral pleural effusions.     Assessment

## 2022-07-02 ENCOUNTER — Telehealth: Payer: Self-pay | Admitting: *Deleted

## 2022-07-02 ENCOUNTER — Ambulatory Visit (INDEPENDENT_AMBULATORY_CARE_PROVIDER_SITE_OTHER): Payer: Medicare Other | Admitting: Internal Medicine

## 2022-07-02 VITALS — BP 132/74 | HR 70 | Ht 68.0 in | Wt 200.0 lb

## 2022-07-02 DIAGNOSIS — R0609 Other forms of dyspnea: Secondary | ICD-10-CM | POA: Diagnosis not present

## 2022-07-02 NOTE — Telephone Encounter (Signed)
Cardiac Catheterization scheduled at [facility] for: Thursday Jul 03, 2022 1 PM Arrival time [facility] Main Entrance A at: 8:30 AM-pre-procedure hydration-per Dr Cooper/protocol-GFR 19  Nothing to eat after midnight prior to procedure, clear liquids until 5 AM day of procedure.  Medication instructions: -Hold:  Eliquis-none 07/01/22 until post procedure  Lasix-day before and day of procedure-per protocol GFR 19-pt took lasix this AM-will hold now until post procedure -Other usual morning medications can be taken with sips of water including aspirin 81 mg.  Confirmed patient has responsible adult to drive home post procedure and be with patient first 24 hours after arriving home.  Plan to go home the same day, you will only stay overnight if medically necessary.  Reviewed procedure instructions with sister, Jae Dire [facility]).

## 2022-07-02 NOTE — Patient Instructions (Signed)
No need for pulmonary follow up in this clinic  at this point - if you have pulmonary hypertension from your circulation they will take care of that in [place] and if you have further questions about it I would be happy to see you back in this clinic to review the work up

## 2022-07-03 ENCOUNTER — Encounter (HOSPITAL_COMMUNITY)
Admission: RE | Disposition: A | Discharge status: Home / Self Care | Payer: Self-pay | Source: Home / Self Care | Attending: Cardiovascular Disease

## 2022-07-03 ENCOUNTER — Encounter: Payer: Self-pay | Admitting: Internal Medicine

## 2022-07-03 ENCOUNTER — Other Ambulatory Visit: Payer: Self-pay

## 2022-07-03 ENCOUNTER — Inpatient Hospital Stay (HOSPITAL_COMMUNITY)
Admission: RE | Admit: 2022-07-03 | Discharge: 2022-07-10 | DRG: 286 | Disposition: A | Discharge status: Home / Self Care | Payer: Medicare Other | Attending: Cardiology | Admitting: Cardiology

## 2022-07-03 DIAGNOSIS — N184 Chronic kidney disease, stage 4 (severe): Secondary | ICD-10-CM | POA: Diagnosis present

## 2022-07-03 DIAGNOSIS — Z86010 Personal history of colonic polyps: Secondary | ICD-10-CM

## 2022-07-03 DIAGNOSIS — I5043 Acute on chronic combined systolic (congestive) and diastolic (congestive) heart failure: Secondary | ICD-10-CM | POA: Diagnosis present

## 2022-07-03 DIAGNOSIS — I252 Old myocardial infarction: Secondary | ICD-10-CM | POA: Diagnosis not present

## 2022-07-03 DIAGNOSIS — I130 Hypertensive heart and chronic kidney disease with heart failure and stage 1 through stage 4 chronic kidney disease, or unspecified chronic kidney disease: Secondary | ICD-10-CM | POA: Diagnosis present

## 2022-07-03 DIAGNOSIS — E785 Hyperlipidemia, unspecified: Secondary | ICD-10-CM | POA: Diagnosis present

## 2022-07-03 DIAGNOSIS — I340 Nonrheumatic mitral (valve) insufficiency: Secondary | ICD-10-CM

## 2022-07-03 DIAGNOSIS — I739 Peripheral vascular disease, unspecified: Secondary | ICD-10-CM | POA: Diagnosis present

## 2022-07-03 DIAGNOSIS — Z7901 Long term (current) use of anticoagulants: Secondary | ICD-10-CM

## 2022-07-03 DIAGNOSIS — I255 Ischemic cardiomyopathy: Secondary | ICD-10-CM | POA: Diagnosis present

## 2022-07-03 DIAGNOSIS — E876 Hypokalemia: Secondary | ICD-10-CM | POA: Diagnosis present

## 2022-07-03 DIAGNOSIS — Z87891 Personal history of nicotine dependence: Secondary | ICD-10-CM

## 2022-07-03 DIAGNOSIS — I080 Rheumatic disorders of both mitral and aortic valves: Secondary | ICD-10-CM | POA: Diagnosis present

## 2022-07-03 DIAGNOSIS — I5023 Acute on chronic systolic (congestive) heart failure: Secondary | ICD-10-CM | POA: Diagnosis not present

## 2022-07-03 DIAGNOSIS — D509 Iron deficiency anemia, unspecified: Secondary | ICD-10-CM | POA: Diagnosis present

## 2022-07-03 DIAGNOSIS — Z79899 Other long term (current) drug therapy: Secondary | ICD-10-CM

## 2022-07-03 DIAGNOSIS — F200 Paranoid schizophrenia: Secondary | ICD-10-CM | POA: Diagnosis present

## 2022-07-03 DIAGNOSIS — Z888 Allergy status to other drugs, medicaments and biological substances status: Secondary | ICD-10-CM | POA: Diagnosis not present

## 2022-07-03 DIAGNOSIS — M109 Gout, unspecified: Secondary | ICD-10-CM | POA: Diagnosis present

## 2022-07-03 DIAGNOSIS — N179 Acute kidney failure, unspecified: Secondary | ICD-10-CM | POA: Diagnosis present

## 2022-07-03 DIAGNOSIS — I2510 Atherosclerotic heart disease of native coronary artery without angina pectoris: Secondary | ICD-10-CM | POA: Diagnosis present

## 2022-07-03 DIAGNOSIS — Z7982 Long term (current) use of aspirin: Secondary | ICD-10-CM

## 2022-07-03 DIAGNOSIS — Z86718 Personal history of other venous thrombosis and embolism: Secondary | ICD-10-CM | POA: Diagnosis not present

## 2022-07-03 HISTORY — PX: RIGHT/LEFT HEART CATH AND CORONARY ANGIOGRAPHY: CATH118266

## 2022-07-03 LAB — POCT I-STAT EG7
Acid-base deficit: 1 mmol/L (ref 0.0–2.0)
Acid-base deficit: 2 mmol/L (ref 0.0–2.0)
Bicarbonate: 23.4 mmol/L (ref 20.0–28.0)
Bicarbonate: 24.5 mmol/L (ref 20.0–28.0)
Calcium, Ion: 1.14 mmol/L — ABNORMAL LOW (ref 1.15–1.40)
Calcium, Ion: 1.22 mmol/L (ref 1.15–1.40)
HCT: 32 % — ABNORMAL LOW (ref 39.0–52.0)
HCT: 33 % — ABNORMAL LOW (ref 39.0–52.0)
Hemoglobin: 10.9 g/dL — ABNORMAL LOW (ref 13.0–17.0)
Hemoglobin: 11.2 g/dL — ABNORMAL LOW (ref 13.0–17.0)
O2 Saturation: 65 %
O2 Saturation: 68 %
Potassium: 3.3 mmol/L — ABNORMAL LOW (ref 3.5–5.1)
Potassium: 3.5 mmol/L (ref 3.5–5.1)
Sodium: 147 mmol/L — ABNORMAL HIGH (ref 135–145)
Sodium: 149 mmol/L — ABNORMAL HIGH (ref 135–145)
TCO2: 25 mmol/L (ref 22–32)
TCO2: 26 mmol/L (ref 22–32)
pCO2, Ven: 44.3 mmHg (ref 44–60)
pCO2, Ven: 45.3 mmHg (ref 44–60)
pH, Ven: 7.332 (ref 7.25–7.43)
pH, Ven: 7.342 (ref 7.25–7.43)
pO2, Ven: 36 mmHg (ref 32–45)
pO2, Ven: 37 mmHg (ref 32–45)

## 2022-07-03 LAB — POCT I-STAT 7, (LYTES, BLD GAS, ICA,H+H)
Acid-base deficit: 4 mmol/L — ABNORMAL HIGH (ref 0.0–2.0)
Bicarbonate: 21.6 mmol/L (ref 20.0–28.0)
Calcium, Ion: 1.21 mmol/L (ref 1.15–1.40)
HCT: 33 % — ABNORMAL LOW (ref 39.0–52.0)
Hemoglobin: 11.2 g/dL — ABNORMAL LOW (ref 13.0–17.0)
O2 Saturation: 95 %
Potassium: 3.1 mmol/L — ABNORMAL LOW (ref 3.5–5.1)
Sodium: 146 mmol/L — ABNORMAL HIGH (ref 135–145)
TCO2: 23 mmol/L (ref 22–32)
pCO2 arterial: 39.9 mmHg (ref 32–48)
pH, Arterial: 7.342 — ABNORMAL LOW (ref 7.35–7.45)
pO2, Arterial: 81 mmHg — ABNORMAL LOW (ref 83–108)

## 2022-07-03 LAB — BASIC METABOLIC PANEL
Anion gap: 8 (ref 5–15)
BUN: 28 mg/dL — ABNORMAL HIGH (ref 8–23)
CO2: 24 mmol/L (ref 22–32)
Calcium: 8.3 mg/dL — ABNORMAL LOW (ref 8.9–10.3)
Chloride: 111 mmol/L (ref 98–111)
Creatinine, Ser: 2.86 mg/dL — ABNORMAL HIGH (ref 0.61–1.24)
GFR, Estimated: 24 mL/min — ABNORMAL LOW (ref >60)
Glucose, Bld: 84 mg/dL (ref 70–99)
Potassium: 2.9 mmol/L — ABNORMAL LOW (ref 3.5–5.1)
Sodium: 143 mmol/L (ref 135–145)

## 2022-07-03 SURGERY — RIGHT/LEFT HEART CATH AND CORONARY ANGIOGRAPHY
Anesthesia: LOCAL

## 2022-07-03 MED ORDER — LABETALOL HCL 5 MG/ML IV SOLN: 10.0000 mg | INTRAVENOUS | Status: AC | PRN

## 2022-07-03 MED ORDER — MIDAZOLAM HCL 2 MG/2ML IJ SOLN
INTRAMUSCULAR | Status: DC | PRN
  Administered 2022-07-03: 1 mg via INTRAVENOUS

## 2022-07-03 MED ORDER — OLANZAPINE 5 MG PO TABS: 5.0000 mg | ORAL_TABLET | ORAL | Status: DC

## 2022-07-03 MED ORDER — SODIUM CHLORIDE 0.9% FLUSH
3.0000 mL | Freq: Two times a day (BID) | INTRAVENOUS | Status: DC
  Administered 2022-07-03 – 2022-07-10 (×12): 3 mL via INTRAVENOUS

## 2022-07-03 MED ORDER — OXYCODONE HCL 5 MG PO TABS: 5.0000 mg | ORAL_TABLET | ORAL | Status: DC | PRN

## 2022-07-03 MED ORDER — FUROSEMIDE 10 MG/ML IJ SOLN: 80.0000 mg | Freq: Once | INTRAMUSCULAR | Status: AC

## 2022-07-03 MED ORDER — ONDANSETRON HCL 4 MG/2ML IJ SOLN: 4.0000 mg | Freq: Four times a day (QID) | INTRAMUSCULAR | Status: DC | PRN

## 2022-07-03 MED ORDER — SERTRALINE HCL 100 MG PO TABS
100.0000 mg | ORAL_TABLET | Freq: Every day | ORAL | Status: DC
  Administered 2022-07-03 – 2022-07-10 (×8): 100 mg via ORAL
  Filled 2022-07-03 (×8): qty 1

## 2022-07-03 MED ORDER — HEPARIN SODIUM (PORCINE) 1000 UNIT/ML IJ SOLN
INTRAMUSCULAR | Status: AC
  Filled 2022-07-03: qty 10

## 2022-07-03 MED ORDER — ISOSORBIDE MONONITRATE ER 30 MG PO TB24
30.0000 mg | ORAL_TABLET | Freq: Every morning | ORAL | Status: DC
  Filled 2022-07-03: qty 1

## 2022-07-03 MED ORDER — ASPIRIN 81 MG PO TBEC
81.0000 mg | DELAYED_RELEASE_TABLET | Freq: Every day | ORAL | Status: DC
  Administered 2022-07-04 – 2022-07-10 (×7): 81 mg via ORAL
  Filled 2022-07-03 (×7): qty 1

## 2022-07-03 MED ORDER — POTASSIUM CHLORIDE CRYS ER 20 MEQ PO TBCR
EXTENDED_RELEASE_TABLET | ORAL | Status: AC
  Filled 2022-07-03: qty 3

## 2022-07-03 MED ORDER — IOHEXOL 350 MG/ML SOLN
INTRAVENOUS | Status: DC | PRN
  Administered 2022-07-03: 20 mL

## 2022-07-03 MED ORDER — POTASSIUM CHLORIDE CRYS ER 20 MEQ PO TBCR
20.0000 meq | EXTENDED_RELEASE_TABLET | Freq: Two times a day (BID) | ORAL | Status: DC
  Filled 2022-07-03: qty 1

## 2022-07-03 MED ORDER — SODIUM CHLORIDE 0.9 % IV SOLN: 250.0000 mL | INTRAVENOUS | Status: DC | PRN

## 2022-07-03 MED ORDER — DARIDOREXANT HCL 25 MG PO TABS: 25.0000 mg | ORAL_TABLET | Freq: Every day | ORAL | Status: DC

## 2022-07-03 MED ORDER — SODIUM CHLORIDE 0.9% FLUSH: 3.0000 mL | INTRAVENOUS | Status: DC | PRN

## 2022-07-03 MED ORDER — SODIUM CHLORIDE 0.9 % WEIGHT BASED INFUSION
3.0000 mL/kg/h | INTRAVENOUS | Status: DC
  Administered 2022-07-03: 3 mL/kg/h via INTRAVENOUS

## 2022-07-03 MED ORDER — ASPIRIN 81 MG PO CHEW: 81.0000 mg | CHEWABLE_TABLET | ORAL | Status: DC

## 2022-07-03 MED ORDER — SODIUM CHLORIDE 0.9% FLUSH
3.0000 mL | Freq: Two times a day (BID) | INTRAVENOUS | Status: DC
  Administered 2022-07-05 – 2022-07-10 (×7): 3 mL via INTRAVENOUS

## 2022-07-03 MED ORDER — LIDOCAINE HCL (PF) 1 % IJ SOLN
INTRAMUSCULAR | Status: DC | PRN
  Administered 2022-07-03 (×2): 2 mL

## 2022-07-03 MED ORDER — HEPARIN SODIUM (PORCINE) 1000 UNIT/ML IJ SOLN
INTRAMUSCULAR | Status: DC | PRN
  Administered 2022-07-03: 5000 [IU] via INTRAVENOUS

## 2022-07-03 MED ORDER — CALCITRIOL 0.25 MCG PO CAPS
0.2500 ug | ORAL_CAPSULE | ORAL | Status: DC
  Administered 2022-07-04 – 2022-07-09 (×3): 0.25 ug via ORAL
  Filled 2022-07-03 (×4): qty 1

## 2022-07-03 MED ORDER — MELATONIN 3 MG PO TABS
3.0000 mg | ORAL_TABLET | Freq: Every day | ORAL | Status: DC
  Administered 2022-07-03 – 2022-07-09 (×7): 3 mg via ORAL
  Filled 2022-07-03 (×7): qty 1

## 2022-07-03 MED ORDER — ROSUVASTATIN CALCIUM 20 MG PO TABS
20.0000 mg | ORAL_TABLET | Freq: Every day | ORAL | Status: DC
  Administered 2022-07-04: 20 mg via ORAL
  Filled 2022-07-03: qty 1

## 2022-07-03 MED ORDER — PANTOPRAZOLE SODIUM 40 MG PO TBEC
40.0000 mg | DELAYED_RELEASE_TABLET | Freq: Every day | ORAL | Status: DC
  Administered 2022-07-04 – 2022-07-10 (×7): 40 mg via ORAL
  Filled 2022-07-03 (×7): qty 1

## 2022-07-03 MED ORDER — FENTANYL CITRATE (PF) 100 MCG/2ML IJ SOLN
INTRAMUSCULAR | Status: DC | PRN
  Administered 2022-07-03: 25 ug via INTRAVENOUS

## 2022-07-03 MED ORDER — VERAPAMIL HCL 2.5 MG/ML IV SOLN
INTRAVENOUS | Status: DC | PRN
  Administered 2022-07-03: 10 mL via INTRA_ARTERIAL

## 2022-07-03 MED ORDER — OLANZAPINE 5 MG PO TABS
5.0000 mg | ORAL_TABLET | ORAL | Status: DC
  Administered 2022-07-04 – 2022-07-10 (×7): 5 mg via ORAL
  Filled 2022-07-03 (×7): qty 1

## 2022-07-03 MED ORDER — METOPROLOL TARTRATE 100 MG PO TABS
100.0000 mg | ORAL_TABLET | Freq: Two times a day (BID) | ORAL | Status: DC
  Administered 2022-07-03: 100 mg via ORAL
  Filled 2022-07-03 (×2): qty 1

## 2022-07-03 MED ORDER — DIAZEPAM 5 MG PO TABS: 5.0000 mg | ORAL_TABLET | Freq: Three times a day (TID) | ORAL | Status: DC | PRN

## 2022-07-03 MED ORDER — HEPARIN (PORCINE) IN NACL 1000-0.9 UT/500ML-% IV SOLN
INTRAVENOUS | Status: DC | PRN
  Administered 2022-07-03 (×2): 500 mL

## 2022-07-03 MED ORDER — SODIUM BICARBONATE 650 MG PO TABS
650.0000 mg | ORAL_TABLET | Freq: Two times a day (BID) | ORAL | Status: DC
  Administered 2022-07-03 – 2022-07-10 (×14): 650 mg via ORAL
  Filled 2022-07-03 (×14): qty 1

## 2022-07-03 MED ORDER — FUROSEMIDE 10 MG/ML IJ SOLN
INTRAMUSCULAR | Status: AC
  Filled 2022-07-03: qty 8

## 2022-07-03 MED ORDER — POTASSIUM CHLORIDE CRYS ER 20 MEQ PO TBCR
60.0000 meq | EXTENDED_RELEASE_TABLET | Freq: Once | ORAL | Status: AC
  Administered 2022-07-03: 60 meq via ORAL

## 2022-07-03 MED ORDER — ACETAMINOPHEN 325 MG PO TABS: 650.0000 mg | ORAL_TABLET | ORAL | Status: DC | PRN

## 2022-07-03 MED ORDER — APIXABAN 2.5 MG PO TABS
2.5000 mg | ORAL_TABLET | Freq: Two times a day (BID) | ORAL | Status: DC
  Administered 2022-07-04 – 2022-07-10 (×13): 2.5 mg via ORAL
  Filled 2022-07-03 (×13): qty 1

## 2022-07-03 MED ORDER — FUROSEMIDE 10 MG/ML IJ SOLN
INTRAMUSCULAR | Status: DC | PRN
  Administered 2022-07-03: 80 mg via INTRAVENOUS

## 2022-07-03 MED ORDER — SODIUM CHLORIDE 0.9 % WEIGHT BASED INFUSION: 1.0000 mL/kg/h | INTRAVENOUS | Status: DC

## 2022-07-03 MED ORDER — VERAPAMIL HCL 2.5 MG/ML IV SOLN
INTRAVENOUS | Status: AC
  Filled 2022-07-03: qty 2

## 2022-07-03 MED ORDER — COLCHICINE 0.6 MG PO TABS
0.6000 mg | ORAL_TABLET | Freq: Two times a day (BID) | ORAL | Status: DC
  Administered 2022-07-03 – 2022-07-06 (×6): 0.6 mg via ORAL
  Filled 2022-07-03 (×6): qty 1

## 2022-07-03 MED ORDER — HYDRALAZINE HCL 20 MG/ML IJ SOLN: 10.0000 mg | INTRAMUSCULAR | Status: AC | PRN

## 2022-07-03 MED ORDER — MIDAZOLAM HCL 2 MG/2ML IJ SOLN
INTRAMUSCULAR | Status: AC
  Filled 2022-07-03: qty 2

## 2022-07-03 MED ORDER — RISPERIDONE 1 MG PO TABS
1.0000 mg | ORAL_TABLET | Freq: Every day | ORAL | Status: DC
  Administered 2022-07-03 – 2022-07-09 (×7): 1 mg via ORAL
  Filled 2022-07-03 (×7): qty 1

## 2022-07-03 MED ORDER — OLANZAPINE 5 MG PO TABS
15.0000 mg | ORAL_TABLET | Freq: Every day | ORAL | Status: DC
  Administered 2022-07-03 – 2022-07-09 (×7): 15 mg via ORAL
  Filled 2022-07-03 (×7): qty 1

## 2022-07-03 MED ORDER — LIDOCAINE HCL (PF) 1 % IJ SOLN
INTRAMUSCULAR | Status: AC
  Filled 2022-07-03: qty 30

## 2022-07-03 MED ORDER — FENTANYL CITRATE (PF) 100 MCG/2ML IJ SOLN
INTRAMUSCULAR | Status: AC
  Filled 2022-07-03: qty 2

## 2022-07-03 SURGICAL SUPPLY — 11 items
CATH 5FR JL3.5 JR4 ANG PIG MP (CATHETERS) IMPLANT
CATH BALLN WEDGE 5F 110CM (CATHETERS) IMPLANT
DEVICE RAD COMP TR BAND LRG (VASCULAR PRODUCTS) IMPLANT
GLIDESHEATH SLEND SS 6F .021 (SHEATH) IMPLANT
GUIDEWIRE INQWIRE 1.5J.035X260 (WIRE) IMPLANT
INQWIRE 1.5J .035X260CM (WIRE) ×1
KIT HEART LEFT (KITS) ×1 IMPLANT
PACK CARDIAC CATHETERIZATION (CUSTOM PROCEDURE TRAY) ×1 IMPLANT
SHEATH GLIDE SLENDER 4/5FR (SHEATH) IMPLANT
TRANSDUCER W/STOPCOCK (MISCELLANEOUS) ×1 IMPLANT
TUBING CIL FLEX 10 FLL-RA (TUBING) ×1 IMPLANT

## 2022-07-03 NOTE — Interval H&P Note (Signed)
History and Physical Interval Note:  07/03/2022 3:19 PM  Glenn Hawkins  has presented today for surgery, with the diagnosis of mr.  The various methods of treatment have been discussed with the patient and family. After consideration of risks, benefits and other options for treatment, the patient has consented to  Procedure(s): RIGHT/LEFT HEART CATH AND CORONARY ANGIOGRAPHY (N/A) as a surgical intervention.  The patient's history has been reviewed, patient examined, no change in status, stable for surgery.  I have reviewed the patient's chart and labs.  Questions were answered to the patient's satisfaction.     Tonny Bollman

## 2022-07-03 NOTE — Consult Note (Signed)
Advanced Heart Failure Team Consult Note   Primary Physician: [name], MD PCP-Cardiologist:  [name], MD  Reason for Consultation: Acute on chronic systolic CHF/assessment for MitraClip  HPI:    Glenn Hawkins is seen today for acute on chronic systolic CHF and assessment for MitraClip at the request of Dr. [name] with [facility]. 67 y.o. male with history of chronic systolic CHF/iCM, CKD IV, severe MR, hx DVT on anticoagulation, schizophrenia, iron deficiency anemia.   Had late presentation inferior MI in 2013. He was managed medically d/t CKD. Subsequent stress myoview in 02/2011 showed large transmural infarct in LCX territory, EF 45%.   Echo July 2021 EF 30-35%.   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  He was admitted to [facility] in March 2024 d/t respiratory failure and multifocal PNA.  Readmitted to [facility] in March 2024 with a/c CHF. He was diuresed with IV lasix and transitioned to po lasix 40 mg daily. Echo during admit with EF 35-40%, RV mildly reduced, severe LAE, severe MR. He was referred for outpatient f/u with structural team for consideration of MitraClip.  TEE: EF 35-40%, RV mildly reduced, severe ischemic MR  He was seen by Dr. [name] on 06/05/22 and felt to be potential candidate for mTEER.  Seen in ED at [facility] 04/28 with a/c CHF. Given IV lasix and discharged home.  Presented for [facility] today as part of workup for mTEER. R/LHC: -Subtotal occlusion m LCX and m RCA, RCA collateralized by LAD, RA mean 20, PA mean 50, PCWP 30 with V waves to 40, LVEDP 29, CO/CI 6.57/3.21.   He was admitted for IV diuresis/optimization and evaluation by heart failure team for consideration of mTEER.  Diuresed with 80 mg IV lasix yesterday evening. Reports good diuresis. No weight this am. + dyspnea with exertion and orthopnea.   Home Medications Prior to Admission medications   Medication Sig Start Date End Date  Taking? Authorizing Provider  apixaban (ELIQUIS) 5 MG TABS tablet Take 2.5 mg by mouth 2 (two) times daily.   Yes [provider]  aspirin EC 81 MG tablet Take 81 mg by mouth daily. Swallow whole.   Yes [provider]  brimonidine (ALPHAGAN) 0.2 % ophthalmic solution Place 1 drop into both eyes daily. 04/18/22  Yes [provider]  calcitRIOL (ROCALTROL) 0.25 MCG capsule Take 0.25 mcg by mouth every Monday, Wednesday, and Friday. 06/13/22 06/13/23 Yes [provider]  colchicine 0.6 MG tablet Take 0.6 mg by mouth 2 (two) times daily. 05/26/22  Yes [provider]  Daridorexant HCl 25 MG TABS Take 25 mg by mouth at bedtime.   Yes [provider]  DOCUSATE SODIUM PO Take 1 capsule by mouth daily.   Yes [provider]  esomeprazole (NEXIUM) 20 MG capsule Take 1 capsule (20 mg total) by mouth daily before breakfast. 11/08/20  Yes Letta Median, PA-C  furosemide (LASIX) 40 MG tablet Take 1 tablet (40 mg total) by mouth 2 (two) times daily. Patient taking differently: Take 40 mg by mouth 3 (three) times daily. 06/05/22  Yes Tonny Bollman, MD  isosorbide mononitrate (IMDUR) 30 MG 24 hr tablet Take 1 tablet (30 mg total) by mouth every morning. Patient needs appointment for further refills. 1 st attempt 05/09/22  Yes Zannie Cove, MD  latanoprost (XALATAN) 0.005 % ophthalmic solution Place 1 drop into both eyes at bedtime. 10/11/20  Yes [provider]  melatonin 3 MG  TABS tablet Take 3 mg by mouth at bedtime. 04/16/22  Yes [provider]  metoprolol tartrate (LOPRESSOR) 100 MG tablet Take 100 mg by mouth 2 (two) times daily. 10/11/20  Yes [provider]  nitroGLYCERIN (NITROSTAT) 0.4 MG SL tablet Dissolve 1 tablet under the tongue every 5 minutes as needed for chest pain. Max of 3 doses, then 911. 06/05/22  Yes Tonny Bollman, MD  OLANZapine (ZYPREXA) 10 MG tablet Take 5-15 mg by mouth See admin instructions. Take 5  mg in the morning and 15 mg at night 03/24/22  Yes [provider]  risperiDONE (RISPERDAL) 1 MG tablet Take 1 tablet (1 mg total) by mouth at bedtime. 05/09/22  Yes Zannie Cove, MD  rosuvastatin (CRESTOR) 40 MG tablet Take 20 mg by mouth daily.   Yes [provider]  sertraline (ZOLOFT) 100 MG tablet Take 100 mg by mouth daily.   Yes [provider]  sodium bicarbonate 650 MG tablet Take 650 mg by mouth 2 (two) times daily.   Yes [provider]    Past Medical History: Past Medical History:  Diagnosis Date   Aortic insufficiency    a. 08/2019 Echo: mild to mod AI.   C. 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 02/2011 Late presentation MI-->Myoview w/ large area of transmural infarct in LCX distribution, no ischemia.   Depressive disorder, not elsewhere classified    Esophageal reflux    Gout, unspecified    Hemorrhoids    a. 01/2016 s/p hemorrhoidectomy.   HFrEF (heart failure with reduced ejection fraction) (HCC)    a. 04/2012 Echo: EF 40%, inflat AK, Gr1 DD, mild AI/MR, triv TR; b. 11/2017 EchoP EF 30-35%, inflat HK; c. 08/2019 Echo: EF 30-35%, gr1 DD, inflat AK.   History of DVT (deep vein thrombosis)    a. Chronic Eliquis.   Hyperlipidemia LDL goal <70    Hypertension    Hypotension, unspecified    Ischemic cardiomyopathy    a. 04/2012 Echo: EF 40%, inflat AK, Gr1 DD, mild AI/MR, triv TR; b. 11/2017 EchoP EF 30-35%, inflat HK; c. 08/2019 Echo: EF 30-35%, gr1 DD, inflat AK. Nl RV size/fxn. Mild MR. Mild to mod AI.   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 04/2012 Echo: EF 40%, mild MR.   Myocardial infarction (lateral wall) (HCC) 2013   a. 02/2011 - late presentation. Managed conservatively 2/2 CKD;  b. 02/2011 Myoview: Large transmural infarct in  the LCX distribution, no ischemia.   PAD (peripheral artery disease) (HCC)    a. 08/2015 ABI/duplex: R: 0.86, L 0.96. Duplex w/ bilateral heterogeneous plaque in mid fem arteries, no significant stenoses; b. 08/2019 ABI/Duplex: prob bilat inflow dzs w/ stable ABIs (R 0.85, L 0.93).   Personal history of tobacco use, presenting hazards to health    Torn ligament    Unspecified schizophrenia, unspecified condition     Past Surgical History: Past Surgical History:  Procedure Laterality Date   BIOPSY  12/17/2020   Procedure: BIOPSY;  Surgeon: Lanelle Bal, DO;  Location: [facility] ENDO [facility];  Service: Endoscopy;;   COLONOSCOPY     COLONOSCOPY N/A 11/10/2014   Procedure: COLONOSCOPY;  Surgeon: Ruffin Frederick, MD;  Location: [facility] ENDOSCOPY;  Service: Gastroenterology;  Laterality: N/A;   COLONOSCOPY WITH  PROPOFOL N/A 05/08/2015   Surgeon: West Bali, MD; nonthrombosed external hemorrhoids, one 8 mm tubular adenoma, one 4 mm tubular adenoma.  Repeat colonoscopy in 5-10 years.   COLONOSCOPY WITH PROPOFOL N/A 12/17/2020   Procedure: COLONOSCOPY WITH PROPOFOL;  Surgeon: Lanelle Bal, DO;  Location: [facility];  Service: Endoscopy;  Laterality: N/A;  8:30am   ESOPHAGOGASTRODUODENOSCOPY (EGD) WITH PROPOFOL N/A 05/08/2015   Surgeon: West Bali, MD; LA grade a reflux esophagitis, normal stomach and duodenum.   ESOPHAGOGASTRODUODENOSCOPY (EGD) WITH PROPOFOL N/A 12/17/2020   Procedure: ESOPHAGOGASTRODUODENOSCOPY (EGD) WITH PROPOFOL;  Surgeon: Lanelle Bal, DO;  Location: [facility];  Service: Endoscopy;  Laterality: N/A;   EYE SURGERY Right    removal of foreign body   HEMORRHOID SURGERY N/A 02/20/2016   Procedure: EXTENSIVE HEMORRHOIDECTOMY;  Surgeon: Franky Macho, MD;  Location: [facility] ORS;  Service: General;  Laterality: N/A;   None     POLYPECTOMY  05/08/2015   Procedure: POLYPECTOMY;  Surgeon: West Bali, MD;  Location: [facility];  Service: Endoscopy;;  transverse  colon polyp   TEE WITHOUT CARDIOVERSION N/A 05/26/2022   Procedure: TRANSESOPHAGEAL ECHOCARDIOGRAM (TEE);  Surgeon: Wendall Stade, MD;  Location: [facility] ENDOSCOPY;  Service: Cardiovascular;  Laterality: N/A;    Family History: Family History  Problem Relation Age of Onset   Crohn's disease Sister    Colon cancer Neg Hx     Social History: Social History   Socioeconomic History   Marital status: Divorced    Spouse name: Not on file   Number of children: Not on file   Years of education: Not on file   Highest education level: Not on file  Occupational History   Not on file  Tobacco Use   Smoking status: Former    Packs/day: 1.00    Years: 23.00    Additional pack years: 0.00    Total pack years: 23.00    Types: Cigarettes    Quit date: 02/25/1996    Years since quitting: 26.3   Smokeless tobacco: Never   Tobacco comments:    + 15 years of smoking  Vaping Use   Vaping Use: Never used  Substance and Sexual Activity   Alcohol use: No    Comment: Daily alcohol use 20+ years ago   Drug use: Not Currently    Types: Cocaine    Comment: History of intranasal cocaine and speed 20+ years ago.   Sexual activity: Not Currently    Birth control/protection: None  Other Topics Concern   Not on file  Social History Narrative   Not on file   Social Determinants of Health   Financial Resource Strain: Not on file  Food Insecurity: No Food Insecurity (05/07/2022)   Hunger Vital Sign    Worried About Running Out of Food in the Last Year: Never true    Ran Out of Food in the Last Year: Never true  Transportation Needs: No Transportation Needs (05/07/2022)   PRAPARE - Administrator, Civil Service (Medical): No    Lack of Transportation (Non-Medical): No  Physical Activity: Not on file  Stress: Not on file  Social Connections: Not on file    Allergies:  Allergies  Allergen Reactions   Nifedipine Diarrhea   Aciphex [Rabeprazole Sodium] Diarrhea   Benazepril Other (See  Comments)    Unknown   Haloperidol Anxiety    Causes severe anxiety   Prilosec [Omeprazole] Diarrhea    Objective:  Vital Signs:   Temp:  [98.3 F (36.8 C)] 98.3 F (36.8 C) (05/09 0920) Pulse Rate:  [65] 65 (05/09 0920) BP: (144)/(95) 144/95 (05/09 0920) SpO2:  [99 %-100 %] 100 % (05/09 1506) Weight:  [90.7 kg] 90.7 kg (05/09 0920)    Weight change: Filed Weights   07/03/22 0920  Weight: 90.7 kg    Intake/Output:  No intake or output data in the 24 hours ending 07/03/22 1630    Physical Exam    General:  Well appearing.  HEENT: normal Neck: supple. JVP to jaw. Carotids 2+ bilat; no bruits.  Cor: PMI nondisplaced. Regular rate & rhythm. No rubs, gallops, 2/6 MR murmur Lungs: clear Abdomen: soft, nontender, nondistended.  Extremities: no cyanosis, clubbing, rash, edema Neuro: alert & orientedx3. Affect pleasant   Telemetry   SR 70s, PVCs   Labs   Basic Metabolic Panel: Recent Labs  Lab 06/27/22 1455 07/03/22 1330 07/03/22 1536 07/03/22 1541  NA 147* 143 147*  149* 146*  K 3.3* 2.9* 3.5  3.3* 3.1*  CL 108* 111  --   --   CO2 22 24  --   --   GLUCOSE 116* 84  --   --   BUN 29* 28*  --   --   CREATININE 3.47* 2.86*  --   --   CALCIUM 8.4* 8.3*  --   --     Liver Function Tests: No results for input(s): "AST", "ALT", "ALKPHOS", "BILITOT", "PROT", "ALBUMIN" in the last 168 hours. No results for input(s): "LIPASE", "AMYLASE" in the last 168 hours. No results for input(s): "AMMONIA" in the last 168 hours.  CBC: Recent Labs  Lab 06/27/22 1455 07/03/22 1536 07/03/22 1541  WBC 4.4  --   --   HGB 10.5* 11.2*  10.9* 11.2*  HCT 33.9* 33.0*  32.0* 33.0*  MCV 94  --   --   PLT 114*  --   --     Cardiac Enzymes: No results for input(s): "CKTOTAL", "CKMB", "CKMBINDEX", "TROPONINI" in the last 168 hours.  BNP: BNP (last 3 results) Recent Labs    05/06/22 0358  BNP 1,699.1*    ProBNP (last 3 results) No results for input(s): "PROBNP" in  the last 8760 hours.   CBG: No results for input(s): "GLUCAP" in the last 168 hours.  Coagulation Studies: No results for input(s): "LABPROT", "INR" in the last 72 hours.   Imaging   CARDIAC CATHETERIZATION  Result Date: 07/03/2022 1.  Patent left main stem and LAD without significant stenosis 2.  Subtotal occlusion of the mid circumflex and the mid RCA, RCA collateralized from a septal perforating cascade of the LAD 3.  Severely elevated LVEDP of 29 mmHg 4.  Elevated right heart pressures: RA mean 20 mmHg PA 74/39 mean 50 mmHg Wedge pressure mean 30, V wave 40 mmHg Cardiac output 6.57 L/min, cardiac index 3.21 Transpulmonic gradient 20 mmHg, PVR 3 Wood units Recommend: Hospital admission for IV diuresis, heart failure consultation, consider transcatheter edge-to-edge repair of the mitral valve following optimization of medical therapy. Medical therapy for CAD.     Medications:     Current Medications:  aspirin  81 mg Oral Pre-Cath   potassium chloride SA       sodium chloride flush  3 mL Intravenous Q12H    Infusions:  sodium chloride     sodium chloride 1 mL/kg/hr (07/03/22 1029)      Patient Profile   25 y.ol male with hx CAD, chronic systolic  CHF/iCM, severe MR, CKD IV, schizophrenia. Presented for RHC and found to be significantly volume overloaded >> admitted for IV diuresis and evaluation for possible mTEER.   Assessment/Plan   Acute on chronic systolic CHFiCM: - Prior inferior MI in 2013. EF has been reduced since then - Echo 07/21: EF 30-35% - Echo 03/24: EF 35-40%, RV mildly reduced, severe LAE, severe MR. - TEE 04/24: EF 35-40%, RV mildly reduced, severe ischemic MR - R/LHC: Subtotal occlusion m LCX and m RCA, RCA collateralized by LAD, RA mean 20, PA mean 50, PCWP 30 with V waves to 40, LVEDP 29, CO/CI 6.57/3.21.  - NYHA IIIb - volume overloaded. Needs additional diuresis. Start IV lasix 80 BID. May need metolazone. - On metoprolol tartrate 100 BID, switch to  metoprolol XL 100 daily. - Stop Imdur. Add bidil 1 tab TID.  2. Severe MR: -Has been seen by structural team -Considering mTEER once HF management optimized  3. CAD: -Hx inferior MI in 2013 -LHC 07/03/22: Subtotal occlusion m LCX and m RCA, RCA collateralized by LAD, LAD okay -On rosuvastatin 20 mg daily. Will switch to atorvastatin 80 given renal dysfunction.   4. CKD IV: -Baseline seems to be 2.8-3.2 -Monitor post contrast and with diuresis -Sees [facility]  5. Iron deficiency anemia: -Hgb stable 10.5, improved from 7s in March. Has been treated with feraheme  6. Schizophrenia  7. Hypokalemia: -K 3.0. Supp with diuresis.  Length of Stay: 0  Donnarae Rae N, PA-C  07/03/2022, 4:30 PM  Advanced Heart Failure Team Pager [phone] (M-F; 7a - 5p)  Please contact [facility] for night-coverage after hours (4p -7a ) and weekends on [email or web address]

## 2022-07-03 NOTE — H&P (Signed)
Expand All Collapse All   Cardiology Office Note:     Date:  06/08/2022    ID:  Glenn Hawkins, DOB 08-Feb-1956, MRN [id number]   PCP:  [name], MD              [facility] Providers Cardiologist:  [name], MD      Referring MD: [name], MD    No chief complaint on file.     History of Present Illness:     Glenn Hawkins is a 67 y.o. male presenting for evaluation of mitral regurgitation, referred by Dr Rennis Golden.   The patient has a hx of ischemic cardiomyopathy and chronic systolic heart failure. However, he has been treated medically and has never undergone cardiac catheterization because of the presence of chronic kidney disease with concern over contrast nephropathy. He initially presented in 2013 with a late presenting inferior MI. He wasn't taken to the cath lab because of CKD and medical therapy was recommended. He has been anticoagulated since 2016 when he had extensive DVT. The patient was hospitalized twice with respiratory failure in March of this year, nearly requiring intubation during the first hospitalization. He was diuresed and treated for heart failure, and was found to have severe mitral regurgitation felt to be ischemic in etiology. HF treatment has been complicated by the presence of Stage IV CKD. He ultimately underwent TEE confirming severe ischemic MR secondary to posterior leaflet restriction with moderately severe segmental LV systolic dysfunction and LVEF of 35%.    The patient is here with his sister today. He has been trying to follow a low sodium diet, but it doesn't sound like he's been compliant with that in the past. He continues to stuggle with exertional dyspnea and fatigue. He doesn't currently have problems with orthopnea, PND, or edema. No lightheadedness or syncope. He denies chest pain or pressure. He reports that he's been feeling better since he doubled his lasix dose.        Past Medical History:  Diagnosis Date   Aortic  insufficiency      a. 08/2019 Echo: mild to mod AI.   C. difficile diarrhea 07/2020   CKD (chronic kidney disease) stage 3, GFR 30-59 ml/min      baseline Cr around 2.5   Combined systolic and diastolic congestive heart failure 05/06/2022   Coronary atherosclerosis of native coronary artery      a. 02/2011 Late presentation MI-->Myoview w/ large area of transmural infarct in LCX distribution, no ischemia.   Depressive disorder, not elsewhere classified     Esophageal reflux     Gout, unspecified     Hemorrhoids      a. 01/2016 s/p hemorrhoidectomy.   HFrEF (heart failure with reduced ejection fraction)      a. 04/2012 Echo: EF 40%, inflat AK, Gr1 DD, mild AI/MR, triv TR; b. 11/2017 EchoP EF 30-35%, inflat HK; c. 08/2019 Echo: EF 30-35%, gr1 DD, inflat AK.   History of DVT (deep vein thrombosis)      a. Chronic Eliquis.   Hyperlipidemia LDL goal <70     Hypertension     Hypotension, unspecified     Ischemic cardiomyopathy      a. 04/2012 Echo: EF 40%, inflat AK, Gr1 DD, mild AI/MR, triv TR; b. 11/2017 EchoP EF 30-35%, inflat HK; c. 08/2019 Echo: EF 30-35%, gr1 DD, inflat AK. Nl RV size/fxn. Mild MR. Mild to mod AI.   Mitral valve disorders(424.0)  a. 04/2012 Echo: EF 40%, mild MR.   Myocardial infarction (lateral wall) 2013    a. 02/2011 - late presentation. Managed conservatively 2/2 CKD;  b. 02/2011 Myoview: Large transmural infarct in the LCX distribution, no ischemia.   PAD (peripheral artery disease)      a. 08/2015 ABI/duplex: R: 0.86, L 0.96. Duplex w/ bilateral heterogeneous plaque in mid fem arteries, no significant stenoses; b. 08/2019 ABI/Duplex: prob bilat inflow dzs w/ stable ABIs (R 0.85, L 0.93).   Personal history of tobacco use, presenting hazards to health     Torn ligament     Unspecified schizophrenia, unspecified condition             Past Surgical History:  Procedure Laterality Date   BIOPSY   12/17/2020    Procedure: BIOPSY;  Surgeon: Lanelle Bal, DO;   Location: [facility];  Service: Endoscopy;;   COLONOSCOPY       COLONOSCOPY N/A 11/10/2014    Procedure: COLONOSCOPY;  Surgeon: Ruffin Frederick, MD;  Location: [facility] ENDOSCOPY;  Service: Gastroenterology;  Laterality: N/A;   COLONOSCOPY WITH PROPOFOL N/A 05/08/2015    Surgeon: West Bali, MD; nonthrombosed external hemorrhoids, one 8 mm tubular adenoma, one 4 mm tubular adenoma.  Repeat colonoscopy in 5-10 years.   COLONOSCOPY WITH PROPOFOL N/A 12/17/2020    Procedure: COLONOSCOPY WITH PROPOFOL;  Surgeon: Lanelle Bal, DO;  Location: [facility];  Service: Endoscopy;  Laterality: N/A;  8:30am   ESOPHAGOGASTRODUODENOSCOPY (EGD) WITH PROPOFOL N/A 05/08/2015    Surgeon: West Bali, MD; LA grade a reflux esophagitis, normal stomach and duodenum.   ESOPHAGOGASTRODUODENOSCOPY (EGD) WITH PROPOFOL N/A 12/17/2020    Procedure: ESOPHAGOGASTRODUODENOSCOPY (EGD) WITH PROPOFOL;  Surgeon: Lanelle Bal, DO;  Location: [facility];  Service: Endoscopy;  Laterality: N/A;   EYE SURGERY Right      removal of foreign body   HEMORRHOID SURGERY N/A 02/20/2016    Procedure: EXTENSIVE HEMORRHOIDECTOMY;  Surgeon: Franky Macho, MD;  Location: [facility];  Service: General;  Laterality: N/A;   None       POLYPECTOMY   05/08/2015    Procedure: POLYPECTOMY;  Surgeon: West Bali, MD;  Location: [facility];  Service: Endoscopy;;  transverse colon polyp   TEE WITHOUT CARDIOVERSION N/A 05/26/2022    Procedure: TRANSESOPHAGEAL ECHOCARDIOGRAM (TEE);  Surgeon: Wendall Stade, MD;  Location: [facility] ENDOSCOPY;  Service: Cardiovascular;  Laterality: N/A;      Current Medications: Active Medications      Current Meds  Medication Sig   apixaban (ELIQUIS) 2.5 MG TABS tablet Take 2.5 mg by mouth 2 (two) times daily.   brimonidine (ALPHAGAN) 0.2 % ophthalmic solution Place 1 drop into both eyes 3 (three) times daily.   colchicine 0.6 MG tablet Take 0.6 mg by mouth 2 (two) times daily.   Daridorexant  HCl 25 MG TABS Take 25 mg by mouth at bedtime.   esomeprazole (NEXIUM) 20 MG capsule Take 1 capsule (20 mg total) by mouth daily before breakfast.   isosorbide mononitrate (IMDUR) 30 MG 24 hr tablet Take 1 tablet (30 mg total) by mouth every morning. Patient needs appointment for further refills. 1 st attempt   latanoprost (XALATAN) 0.005 % ophthalmic solution Place 1 drop into both eyes at bedtime.   melatonin 3 MG TABS tablet Take 3 mg by mouth at bedtime.   metoprolol tartrate (LOPRESSOR) 100 MG tablet Take 100 mg by mouth 2 (two) times daily.   OLANZapine (ZYPREXA)  15 MG tablet Take 15 mg by mouth at bedtime.   OLANZapine (ZYPREXA) 5 MG tablet Take 5 mg by mouth daily.   risperiDONE (RISPERDAL) 1 MG tablet Take 1 tablet (1 mg total) by mouth at bedtime.   rosuvastatin (CRESTOR) 40 MG tablet Take 20 mg by mouth daily.   sertraline (ZOLOFT) 100 MG tablet Take 100 mg by mouth daily.   sodium bicarbonate 650 MG tablet Take 650 mg by mouth 2 (two) times daily.   [DISCONTINUED] allopurinol (ZYLOPRIM) 100 MG tablet Take 1 tablet (100 mg total) by mouth daily.   [DISCONTINUED] furosemide (LASIX) 40 MG tablet Take 1 tablet (40 mg total) by mouth daily.   [DISCONTINUED] nitroGLYCERIN (NITROSTAT) 0.4 MG SL tablet Place 1 tablet (0.4 mg total) under the tongue every 5 (five) minutes as needed for chest pain.        Allergies:   Nifedipine, Rabeprazole, Benazepril, Haloperidol, and Omeprazole    Social History         Socioeconomic History   Marital status: Divorced      Spouse name: Not on file   Number of children: Not on file   Years of education: Not on file   Highest education level: Not on file  Occupational History   Not on file  Tobacco Use   Smoking status: Former      Packs/day: 1.00      Years: 23.00      Additional pack years: 0.00      Total pack years: 23.00      Types: Cigarettes      Quit date: 02/25/1996      Years since quitting: 26.3   Smokeless tobacco: Never    Tobacco comments:      + 15 years of smoking  Vaping Use   Vaping Use: Never used  Substance and Sexual Activity   Alcohol use: No      Comment: Daily alcohol use 20+ years ago   Drug use: Not Currently      Types: Cocaine      Comment: History of intranasal cocaine and speed 20+ years ago.   Sexual activity: Not Currently      Birth control/protection: None  Other Topics Concern   Not on file  Social History Narrative   Not on file    Social Determinants of Health        Financial Resource Strain: Not on file  Food Insecurity: No Food Insecurity (05/07/2022)    Hunger Vital Sign     Worried About Running Out of Food in the Last Year: Never true     Ran Out of Food in the Last Year: Never true  Transportation Needs: No Transportation Needs (05/07/2022)    PRAPARE - Therapist, art (Medical): No     Lack of Transportation (Non-Medical): No  Physical Activity: Not on file  Stress: Not on file  Social Connections: Not on file      Family History: The patient's family history includes Crohn's disease in his sister. There is no history of Colon cancer.   ROS:   Please see the history of present illness.    All other systems reviewed and are negative.   EKGs/Labs/Other Studies Reviewed:     The following studies were reviewed today: Cardiac Studies & Procedures       ECHOCARDIOGRAM   ECHOCARDIOGRAM COMPLETE 05/07/2022   Narrative ECHOCARDIOGRAM REPORT       Patient Name:   AYDIN  Duffy Rhody Date of Exam: 05/07/2022 Medical Rec #:  [id number]      Height:       68.0 in Accession #:    [id number]     Weight:       176.8 lb Date of Birth:  06-01-1955       BSA:          1.939 m Patient Age:    66 years       BP:           154/89 mmHg Patient Gender: M              HR:           89 bpm. Exam Location:  Inpatient   Procedure: 2D Echo, Color Doppler and Cardiac Doppler   Indications:    I50.21 Acute systolic (congestive) heart failure    History:        Patient has prior history of Echocardiogram examinations, most recent 09/15/2019. CHF, PAD; Risk Factors:Hypertension and Dyslipidemia.   Sonographer:    Irving Burton Senior RDCS Referring Phys: [phone] RONDELL A SMITH   IMPRESSIONS     1. Left ventricular ejection fraction, by estimation, is 35 to 40%. The left ventricle has moderately decreased function. The left ventricle demonstrates regional wall motion abnormalities (see scoring diagram/findings for description). The left ventricular internal cavity size was moderately to severely dilated. Left ventricular diastolic parameters are indeterminate. 2. Right ventricular systolic function is mildly reduced. The right ventricular size is normal. There is mildly elevated pulmonary artery systolic pressure. The estimated right ventricular systolic pressure is 41.2 mmHg. 3. Left atrial size was severely dilated. 4. A small pericardial effusion is present. The pericardial effusion is localized near the right atrium. 5. 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 There is splay artifact. The mitral valve is grossly normal. Severe mitral valve regurgitation. No evidence of mitral stenosis. 6. The aortic valve is tricuspid. There is mild calcification of the aortic valve. Aortic valve regurgitation is mild to moderate. No aortic stenosis is present. 7. The inferior vena cava is dilated in size with >50% respiratory variability, suggesting right atrial pressure of 8 mmHg.   FINDINGS Left Ventricle: Left ventricular ejection fraction, by estimation, is 35 to 40%. The left ventricle has moderately decreased function. The left ventricle demonstrates regional wall motion abnormalities. The left ventricular internal cavity size was moderately to severely dilated. There is no left ventricular hypertrophy. Left ventricular diastolic function could not be  evaluated due to mitral regurgitation (moderate or greater). Left ventricular diastolic parameters are indeterminate.     LV Wall Scoring: The mid and distal lateral wall and mid anterolateral segment are akinetic. The entire anterior wall, basal anterolateral segment, and mid inferior segment are hypokinetic.   Right Ventricle: The right ventricular size is normal. No increase in right ventricular wall thickness. Right ventricular systolic function is mildly reduced. There is mildly elevated pulmonary artery systolic pressure. The tricuspid regurgitant velocity is 2.88 m/s, and with an assumed right atrial pressure of 8 mmHg, the estimated right ventricular systolic pressure is 41.2 mmHg.   Left Atrium: Left atrial size was severely dilated.   Right Atrium: Right atrial size was normal in size.   Pericardium: A small pericardial effusion is present. The pericardial effusion is localized near the right atrium.   Mitral Valve: Severe MR, mechanism appears to be Carpentier IIIb, tethering of posterior leaflet due to  lateral regional wall motion abnormalities. There is splay artifact. The mitral valve is grossly normal. Severe mitral valve regurgitation. No evidence of mitral valve stenosis.   Tricuspid Valve: The tricuspid valve is normal in structure. Tricuspid valve regurgitation is mild . No evidence of tricuspid stenosis.   Aortic Valve: The aortic valve is tricuspid. There is mild calcification of the aortic valve. Aortic valve regurgitation is mild to moderate. No aortic stenosis is present.   Pulmonic Valve: The pulmonic valve was normal in structure. Pulmonic valve regurgitation is mild. No evidence of pulmonic stenosis.   Aorta: The aortic root is normal in size and structure.   Venous: The inferior vena cava is dilated in size with greater than 50% respiratory variability, suggesting right atrial pressure of 8 mmHg.   IAS/Shunts: No atrial level shunt detected by color flow  Doppler.     LEFT VENTRICLE PLAX 2D LVIDd:         6.90 cm      Diastology LVIDs:         5.50 cm      LV e' medial:    5.55 cm/s LV PW:         1.00 cm      LV E/e' medial:  22.3 LV IVS:        0.80 cm      LV e' lateral:   9.36 cm/s LVOT diam:     2.40 cm      LV E/e' lateral: 13.2 LV SV:         106 LV SV Index:   55 LVOT Area:     4.52 cm   LV Volumes (MOD) LV vol d, MOD A2C: 200.0 ml LV vol d, MOD A4C: 180.0 ml LV vol s, MOD A2C: 127.0 ml LV vol s, MOD A4C: 115.0 ml LV SV MOD A2C:     73.0 ml LV SV MOD A4C:     180.0 ml LV SV MOD BP:      64.7 ml   RIGHT VENTRICLE RV S prime:     14.60 cm/s TAPSE (M-mode): 2.1 cm   LEFT ATRIUM              Index        RIGHT ATRIUM           Index LA diam:        5.00 cm  2.58 cm/m   RA Area:     17.80 cm LA Vol (A2C):   94.1 ml  48.52 ml/m  RA Volume:   50.40 ml  25.99 ml/m LA Vol (A4C):   101.0 ml 52.08 ml/m LA Biplane Vol: 97.6 ml  50.32 ml/m AORTIC VALVE LVOT Vmax:   112.00 cm/s LVOT Vmean:  77.700 cm/s LVOT VTI:    0.235 m   AORTA Ao Root diam: 3.40 cm Ao Asc diam:  3.50 cm   MITRAL VALVE                  TRICUSPID VALVE MV Area (PHT): 3.34 cm       TR Peak grad:   33.2 mmHg MV Decel Time: 227 msec       TR Vmax:        288.00 cm/s MR Peak grad:    141.4 mmHg MR Mean grad:    102.5 mmHg   SHUNTS MR Vmax:         594.50 cm/s  Systemic VTI:  0.24 m MR Vmean:  485.5 cm/s   Systemic Diam: 2.40 cm MR PISA:         1.90 cm MR PISA Eff ROA: 12 mm MR PISA Radius:  0.55 cm MV E velocity: 124.00 cm/s MV A velocity: 71.70 cm/s MV E/A ratio:  1.73   Weston Brass MD Electronically signed by Weston Brass MD Signature Date/Time: 05/07/2022/11:05:55 AM       Final   TEE   ECHO TEE 05/26/2022   Narrative TRANSESOPHOGEAL ECHO REPORT       Patient Name:   Glenn Hawkins Date of Exam: 05/26/2022 Medical Rec #:  [id number]      Height:       68.0 in Accession #:    [id number]     Weight:       186.6  lb Date of Birth:  1955/05/04       BSA:          1.984 m Patient Age:    66 years       BP:           134/81 mmHg Patient Gender: M              HR:           76 bpm. Exam Location:  Outpatient   Procedure: 3D Echo, Transesophageal Echo, Color Doppler and Cardiac Doppler   Indications:     I34.0 Nonrheumatic mitral (valve) insufficiency   History:         Patient has prior history of Echocardiogram examinations, most recent 05/07/2022. Cardiomyopathy and CHF, CAD, Mitral Valve Disease; Risk Factors:Hypertension and Dyslipidemia.   Sonographer:     Milbert Coulter Referring Phys:  [id number] Lisette Abu HILTY Diagnosing Phys: Charlton Haws MD   PROCEDURE: After discussion of the risks and benefits of a TEE, an informed consent was obtained from the patient. The transesophogeal probe was passed without difficulty through the esophogus of the patient. Imaged were obtained with the patient in a left lateral decubitus position. Sedation performed by different physician. The patient was monitored while under deep sedation. Anesthestetic sedation was provided intravenously by Anesthesiology: 70mg  of Propofol. Image quality was good. The patient's vital signs; including heart rate, blood pressure, and oxygen saturation; remained stable throughout the procedure. The patient developed no complications during the procedure.   IMPRESSIONS     1. Posterior lateral hypokinesis . Left ventricular ejection fraction, by estimation, is 35 to 40%. The left ventricle has moderately decreased function. The left ventricle demonstrates regional wall motion abnormalities (see scoring diagram/findings for description). The left ventricular internal cavity size was moderately dilated. 2. Right ventricular systolic function is mildly reduced. The right ventricular size is mildly enlarged. 3. Left atrial size was severely dilated. No left atrial/left atrial appendage thrombus was detected. 4. Right atrial size was moderately  dilated. 5. Severe ischemic MR two jets betwen A2/P2 suitable leaflet length, gradients and MVA for clip . The mitral valve is abnormal. Severe mitral valve regurgitation. No evidence of mitral stenosis. 6. Tricuspid valve regurgitation is moderate. 7. The aortic valve is tricuspid. There is mild calcification of the aortic valve. There is mild thickening of the aortic valve. Aortic valve regurgitation is mild. Aortic valve sclerosis is present, with no evidence of aortic valve stenosis.   FINDINGS Left Ventricle: Posterior lateral hypokinesis. Left ventricular ejection fraction, by estimation, is 35 to 40%. The left ventricle has moderately decreased function. The left ventricle demonstrates regional wall motion abnormalities. The left ventricular internal cavity size  was moderately dilated. There is no left ventricular hypertrophy.   Right Ventricle: The right ventricular size is mildly enlarged. Right vetricular wall thickness was not assessed. Right ventricular systolic function is mildly reduced.   Left Atrium: Left atrial size was severely dilated. No left atrial/left atrial appendage thrombus was detected.   Right Atrium: Right atrial size was moderately dilated.   Pericardium: There is no evidence of pericardial effusion.   Mitral Valve: Severe ischemic MR two jets betwen A2/P2 suitable leaflet length, gradients and MVA for clip. The mitral valve is abnormal. Severe mitral valve regurgitation. No evidence of mitral valve stenosis. MV peak gradient, 5.7 mmHg. The mean mitral valve gradient is 2.0 mmHg.   Tricuspid Valve: The tricuspid valve is normal in structure. Tricuspid valve regurgitation is moderate.   Aortic Valve: The aortic valve is tricuspid. There is mild calcification of the aortic valve. There is mild thickening of the aortic valve. Aortic valve regurgitation is mild. Aortic valve sclerosis is present, with no evidence of aortic valve stenosis.   Pulmonic Valve: The  pulmonic valve was normal in structure. Pulmonic valve regurgitation is mild.   Aorta: The aortic root is normal in size and structure.   IAS/Shunts: No atrial level shunt detected by color flow Doppler.   Additional Comments: Spectral Doppler performed.   MITRAL VALVE                  TRICUSPID VALVE MV Area (PHT): 3.99 cm       TR Peak grad:   42.5 mmHg MV Peak grad:  5.7 mmHg       TR Vmax:        326.00 cm/s MV Mean grad:  2.0 mmHg MV Vmax:       1.19 m/s MV Vmean:      59.2 cm/s MV Decel Time: 190 msec MR Peak grad:    105.3 mmHg MR Mean grad:    77.0 mmHg MR Vmax:         513.00 cm/s MR Vmean:        423.0 cm/s MR PISA:         5.09 cm MR PISA Eff ROA: 33 mm MR PISA Radius:  0.90 cm MV E velocity: 96.20 cm/s   Charlton Haws MD Electronically signed by Charlton Haws MD Signature Date/Time: 05/26/2022/11:56:13 AM       Final               EKG:  Most recent EKG is reviewed from 05/26/22 showing NSR, nonspecific T wave abnormality   Recent Labs: 05/06/2022: B Natriuretic Peptide 1,699.1; TSH 1.413 05/07/2022: ALT 19 05/08/2022: BUN 45; Creatinine, Ser 3.45; Hemoglobin 7.7; Platelets 77; Potassium 3.7; Sodium 141  Recent Lipid Panel Labs (Brief)          Component Value Date/Time    CHOL 191 08/02/2019 1515    TRIG 175 (H) 08/02/2019 1515    HDL 37 (L) 08/02/2019 1515    CHOLHDL 5.2 (H) 08/02/2019 1515    CHOLHDL 4.2 11/09/2014 0525    VLDL 23 11/09/2014 0525    LDLCALC 123 (H) 08/02/2019 1515    LDLDIRECT 125 (H) 08/02/2019 1515          Risk Assessment/Calculations:           Physical Exam:     VS:  BP (!) 130/90   Pulse (!) 55   Ht 5\' 8"  (1.727 m)   Wt 182 lb 3.2 oz (82.6 kg)  SpO2 99%   BMI 27.70 kg/m         Wt Readings from Last 3 Encounters:  06/05/22 182 lb 3.2 oz (82.6 kg)  05/26/22 175 lb (79.4 kg)  05/16/22 186 lb 9.6 oz (84.6 kg)      GEN:  Well nourished, well developed in no acute distress HEENT: Normal NECK: No JVD; No  carotid bruits LYMPHATICS: No lymphadenopathy CARDIAC: RRR, 2/6 holosystolic murmur at the LLSB and apex RESPIRATORY:  Clear to auscultation without rales, wheezing or rhonchi  ABDOMEN: Soft, non-tender, non-distended MUSCULOSKELETAL:  No edema; No deformity  SKIN: Warm and dry NEUROLOGIC:  Alert and oriented x 3 PSYCHIATRIC:  Normal affect    ASSESSMENT:     1. Nonrheumatic mitral (valve) insufficiency     PLAN:     In order of problems listed above:   The patient has severe, ischemic mitral regurgitation with LA dilatation, restriction of the posterior leaflet of the mitral valve, and moderately severe LV dysfunction. The patient has NYHA functional class 3 symptoms of shortness of breath consistent with chronic systolic heart failure. His case is complicated by the presence of CAD and old MI with unknown coronary anatomy, stage IV chronic kidney disease (previously declining placement of an AV fistula), anemia with most recent HgB 7.7 mg/dL, peripheral arterial disease, and schizophrenia. I have reviewed the natural history of mitral regurgitation with the patient and their family members who are present today. We have discussed the limitations of medical therapy and the poor prognosis associated with symptomatic mitral regurgitation. We have also reviewed potential treatment options, including palliative medical therapy, conventional surgical mitral valve repair or replacement, and percutaneous mitral valve therapies such as edge-to-edge mitral valve approximation with MitraClip. We discussed treatment options in the context of this patient's specific comorbid medical conditions outlined above. In reviewing his echo and TEE images, I think he has suitable for transcatheter edge-to-edge repair of the mitral valve with either MitraClip or Pascal and he clearly has severe mitral regurgitation that warrants treatment. I advised the patient and his sister that we will review his case with our  multidisciplinary heart valve team and determine appropriate further workup and treatment. Ideally he should have cardiac catheterization as the etiology of his MR is ischemic and he is at risk of multivessel CAD. This would come with risk of AKI in the background of his advanced kidney disease. Alternatively, we could consider proceeding without evaluating his coronary arteries to avoid the risk to his kidneys, but I am reluctant to do that. We may also need to involve nephrology in this discussion. I will follow-up with them once we review his case. As part of his evaluation today, I reviewed the TEER procedure with them, and discussed the typical risks and expected recovery. Will reach back out to his sister once we discuss his case.      Tonny Bollman  ADDENDUM (07/03/2022): Patient's case has been discussed at our multidisciplinary heart valve team meeting.  We elected to proceed with diagnostic right and left heart catheterization to further evaluate treatment options of his severe, presumably ischemic mitral regurgitation.  The patient underwent cardiac catheterization today without complication via right radial and antecubital approach.  Please see cardiac catheterization note for details.  He is found to have hemodynamic evidence of acute on chronic heart failure with elevated intracardiac pressures throughout the right and left heart.  In the setting of his significant chronic kidney disease with baseline creatinine around 3 mg/dL, I think  he should be admitted for IV diuresis and optimization of his heart failure regimen.  Will ask the advanced heart failure team to see him in consultation tomorrow to optimize his regimen and weigh in on whether they feel he would benefit from transcatheter edge-to-edge repair of the mitral valve.  If all are in agreement, I would anticipate proceeding with MitraClip on our next available outpatient procedure date once he is medically optimized.  Will treat him today  with 80 mg of IV furosemide x 1 and assess his response with repeat metabolic panel in the morning.  Tonny Bollman 07/03/2022 4:16 PM

## 2022-07-03 NOTE — Assessment & Plan Note (Addendum)
Quit smoking 1998 -  onset of doe 2016 "with first heart troubles"  but worse since end of 2023 with combined chf and cri - Echo 04/27/22   1. LVEF  35 to 40%. LV moderately decreased function.  The left  ventricular internal cavity size was moderately to severely dilated. Left  ventricular diastolic parameters are indeterminate.   2. Right ventricular systolic function is mildly reduced. The right  ventricular size is normal. There is mildly elevated pulmonary artery  systolic pressure. The estimated right ventricular systolic pressure is  41.2 mmHg.   3. Left atrial size was severely dilated.   4. A small pericardial effusion is present. The pericardial effusion is  localized near the right atrium.   5. 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 There  is splay artifact. The mitral valve is grossly normal. Severe mitral valve  regurgitation. No evidence of  mitral stenosis.   6. The aortic valve is tricuspid. There is mild calcification of the  aortic valve. Aortic valve regurgitation is mild to moderate. No aortic  stenosis is present.   7. The inferior vena cava is dilated in size with >50% respiratory  variability, suggesting right atrial pressure of 8 mmHg.  - 05/16/2022   Walked on RA  x  3  lap(s) =  approx 525  ft  @ rapid pace, stopped due to end of study   with lowest 02 sats 95%  with mild sob at end / no cp or claudication   He says he's better but I suspect that's because he's doing a lot less > agree with plans for [facility] and if WHO 3 is the dx then needs to return here, otherwise f/u prn          Each maintenance medication was reviewed in detail including emphasizing most importantly the difference between maintenance and prns and under what circumstances the prns are to be triggered using an action plan format where appropriate.  Total time for H and P, chart review, counseling,    and generating customized AVS unique to this office visit / same day charting = 25 min summary f/u ov

## 2022-07-04 ENCOUNTER — Other Ambulatory Visit (HOSPITAL_COMMUNITY): Payer: Self-pay

## 2022-07-04 ENCOUNTER — Telehealth (HOSPITAL_COMMUNITY): Payer: Self-pay | Admitting: Pharmacy Technician

## 2022-07-04 ENCOUNTER — Encounter (HOSPITAL_COMMUNITY): Payer: Self-pay | Admitting: Cardiovascular Disease

## 2022-07-04 DIAGNOSIS — E876 Hypokalemia: Secondary | ICD-10-CM | POA: Diagnosis present

## 2022-07-04 DIAGNOSIS — I080 Rheumatic disorders of both mitral and aortic valves: Secondary | ICD-10-CM | POA: Diagnosis present

## 2022-07-04 DIAGNOSIS — N184 Chronic kidney disease, stage 4 (severe): Secondary | ICD-10-CM | POA: Diagnosis present

## 2022-07-04 DIAGNOSIS — I252 Old myocardial infarction: Secondary | ICD-10-CM | POA: Diagnosis not present

## 2022-07-04 DIAGNOSIS — I5023 Acute on chronic systolic (congestive) heart failure: Secondary | ICD-10-CM

## 2022-07-04 DIAGNOSIS — E785 Hyperlipidemia, unspecified: Secondary | ICD-10-CM | POA: Diagnosis present

## 2022-07-04 DIAGNOSIS — I5043 Acute on chronic combined systolic (congestive) and diastolic (congestive) heart failure: Secondary | ICD-10-CM | POA: Diagnosis present

## 2022-07-04 DIAGNOSIS — F200 Paranoid schizophrenia: Secondary | ICD-10-CM | POA: Diagnosis present

## 2022-07-04 DIAGNOSIS — Z888 Allergy status to other drugs, medicaments and biological substances status: Secondary | ICD-10-CM | POA: Diagnosis not present

## 2022-07-04 DIAGNOSIS — I130 Hypertensive heart and chronic kidney disease with heart failure and stage 1 through stage 4 chronic kidney disease, or unspecified chronic kidney disease: Secondary | ICD-10-CM | POA: Diagnosis present

## 2022-07-04 DIAGNOSIS — I739 Peripheral vascular disease, unspecified: Secondary | ICD-10-CM | POA: Diagnosis present

## 2022-07-04 DIAGNOSIS — Z87891 Personal history of nicotine dependence: Secondary | ICD-10-CM | POA: Diagnosis not present

## 2022-07-04 DIAGNOSIS — Z86718 Personal history of other venous thrombosis and embolism: Secondary | ICD-10-CM | POA: Diagnosis not present

## 2022-07-04 DIAGNOSIS — Z7901 Long term (current) use of anticoagulants: Secondary | ICD-10-CM | POA: Diagnosis not present

## 2022-07-04 DIAGNOSIS — Z86010 Personal history of colonic polyps: Secondary | ICD-10-CM | POA: Diagnosis not present

## 2022-07-04 DIAGNOSIS — N179 Acute kidney failure, unspecified: Secondary | ICD-10-CM | POA: Diagnosis present

## 2022-07-04 DIAGNOSIS — I255 Ischemic cardiomyopathy: Secondary | ICD-10-CM | POA: Diagnosis present

## 2022-07-04 DIAGNOSIS — Z7982 Long term (current) use of aspirin: Secondary | ICD-10-CM | POA: Diagnosis not present

## 2022-07-04 DIAGNOSIS — M109 Gout, unspecified: Secondary | ICD-10-CM | POA: Diagnosis present

## 2022-07-04 DIAGNOSIS — D509 Iron deficiency anemia, unspecified: Secondary | ICD-10-CM | POA: Diagnosis present

## 2022-07-04 DIAGNOSIS — I2510 Atherosclerotic heart disease of native coronary artery without angina pectoris: Secondary | ICD-10-CM | POA: Diagnosis present

## 2022-07-04 DIAGNOSIS — Z79899 Other long term (current) drug therapy: Secondary | ICD-10-CM | POA: Diagnosis not present

## 2022-07-04 LAB — RENAL FUNCTION PANEL
Albumin: 3 g/dL — ABNORMAL LOW (ref 3.5–5.0)
Anion gap: 10 (ref 5–15)
BUN: 31 mg/dL — ABNORMAL HIGH (ref 8–23)
CO2: 25 mmol/L (ref 22–32)
Calcium: 8.5 mg/dL — ABNORMAL LOW (ref 8.9–10.3)
Chloride: 111 mmol/L (ref 98–111)
Creatinine, Ser: 2.89 mg/dL — ABNORMAL HIGH (ref 0.61–1.24)
GFR, Estimated: 23 mL/min — ABNORMAL LOW (ref >60)
Glucose, Bld: 141 mg/dL — ABNORMAL HIGH (ref 70–99)
Phosphorus: 2.3 mg/dL — ABNORMAL LOW (ref 2.5–4.6)
Potassium: 3.3 mmol/L — ABNORMAL LOW (ref 3.5–5.1)
Sodium: 146 mmol/L — ABNORMAL HIGH (ref 135–145)

## 2022-07-04 LAB — BASIC METABOLIC PANEL
Anion gap: 8 (ref 5–15)
BUN: 30 mg/dL — ABNORMAL HIGH (ref 8–23)
CO2: 23 mmol/L (ref 22–32)
Calcium: 8.2 mg/dL — ABNORMAL LOW (ref 8.9–10.3)
Chloride: 109 mmol/L (ref 98–111)
Creatinine, Ser: 2.82 mg/dL — ABNORMAL HIGH (ref 0.61–1.24)
GFR, Estimated: 24 mL/min — ABNORMAL LOW (ref >60)
Glucose, Bld: 114 mg/dL — ABNORMAL HIGH (ref 70–99)
Potassium: 3 mmol/L — ABNORMAL LOW (ref 3.5–5.1)
Sodium: 140 mmol/L (ref 135–145)

## 2022-07-04 LAB — LIPID PANEL
Cholesterol: 109 mg/dL (ref 0–200)
HDL: 42 mg/dL (ref >40)
LDL Cholesterol: 55 mg/dL (ref 0–99)
Total CHOL/HDL Ratio: 2.6 RATIO
Triglycerides: 62 mg/dL (ref <150)
VLDL: 12 mg/dL (ref 0–40)

## 2022-07-04 LAB — MAGNESIUM: Magnesium: 1.6 mg/dL — ABNORMAL LOW (ref 1.7–2.4)

## 2022-07-04 MED ORDER — MAGNESIUM SULFATE 2 GM/50ML IV SOLN
2.0000 g | Freq: Once | INTRAVENOUS | Status: AC
  Administered 2022-07-04: 2 g via INTRAVENOUS
  Filled 2022-07-04: qty 50

## 2022-07-04 MED ORDER — POTASSIUM CHLORIDE CRYS ER 20 MEQ PO TBCR
60.0000 meq | EXTENDED_RELEASE_TABLET | Freq: Once | ORAL | Status: AC
  Administered 2022-07-04: 60 meq via ORAL
  Filled 2022-07-04: qty 3

## 2022-07-04 MED ORDER — ATORVASTATIN CALCIUM 80 MG PO TABS
80.0000 mg | ORAL_TABLET | Freq: Every day | ORAL | Status: DC
  Administered 2022-07-05 – 2022-07-10 (×6): 80 mg via ORAL
  Filled 2022-07-04 (×6): qty 1

## 2022-07-04 MED ORDER — POTASSIUM CHLORIDE CRYS ER 20 MEQ PO TBCR
20.0000 meq | EXTENDED_RELEASE_TABLET | Freq: Two times a day (BID) | ORAL | Status: DC
  Administered 2022-07-04 – 2022-07-07 (×7): 20 meq via ORAL
  Filled 2022-07-04 (×7): qty 1

## 2022-07-04 MED ORDER — FUROSEMIDE 10 MG/ML IJ SOLN
80.0000 mg | Freq: Two times a day (BID) | INTRAMUSCULAR | Status: DC
  Administered 2022-07-04 – 2022-07-07 (×7): 80 mg via INTRAVENOUS
  Filled 2022-07-04 (×8): qty 8

## 2022-07-04 MED ORDER — METOPROLOL SUCCINATE ER 100 MG PO TB24
100.0000 mg | ORAL_TABLET | Freq: Every day | ORAL | Status: DC
  Administered 2022-07-04 – 2022-07-10 (×7): 100 mg via ORAL
  Filled 2022-07-04 (×7): qty 1

## 2022-07-04 MED ORDER — METOLAZONE 5 MG PO TABS
2.5000 mg | ORAL_TABLET | Freq: Once | ORAL | Status: AC
  Administered 2022-07-04: 2.5 mg via ORAL
  Filled 2022-07-04: qty 1

## 2022-07-04 MED ORDER — POTASSIUM CHLORIDE CRYS ER 20 MEQ PO TBCR
40.0000 meq | EXTENDED_RELEASE_TABLET | Freq: Once | ORAL | Status: AC
  Administered 2022-07-04: 40 meq via ORAL
  Filled 2022-07-04: qty 2

## 2022-07-04 MED ORDER — ISOSORB DINITRATE-HYDRALAZINE 20-37.5 MG PO TABS
1.0000 | ORAL_TABLET | Freq: Three times a day (TID) | ORAL | Status: DC
  Administered 2022-07-04 – 2022-07-05 (×4): 1 via ORAL
  Filled 2022-07-04 (×4): qty 1

## 2022-07-04 NOTE — TOC Benefit Eligibility Note (Signed)
Patient Product/process development scientist completed.    The patient is currently admitted and upon discharge could be taking isosorbide-hydralazine (Bidil) 20-37.5 mg tablets.  The current 30 day co-pay is $20.00.   The patient is insured through [name] is the only [employer] that takes his insurance)   This test claim was processed through [employer]- copay amounts may vary at other pharmacies due to [employer], or as the patient moves through the different stages of their insurance plan.  Glenn Hawkins, CPHT Pharmacy Patient Advocate Specialist [facility] Patient Advocate Team Direct Number: [phone]  Fax: [phone]

## 2022-07-04 NOTE — Telephone Encounter (Signed)
Pharmacy Patient Advocate Encounter  Insurance verification completed.    The patient is insured through [employer]   The patient is currently admitted and ran test claims for the following: isosorbide-hydralazine (Bidil) .  Copays and coinsurance results were relayed to Inpatient clinical team.

## 2022-07-04 NOTE — TOC Initial Note (Signed)
Transition of Care [facility]) - Initial/Assessment Note    Patient Details  Name: Glenn Hawkins MRN: [id number] Date of Birth: [birth year]  [facility]) CM/SW Contact:    Elliot Cousin, RN Phone Number: [phone] 07/04/2022, 5:26 PM   Clinical Narrative:  CM spoke to pt and states he was independent PTA. States he drives to his appt. Has scale at home to do daily weights. Will continue to follow for dc needs.            Expected Discharge Plan: Home/Self Care Barriers to Discharge: Continued Medical Work up   Patient Goals and CMS Choice            Expected Discharge Plan and Services   Discharge Planning Services: CM Consult   Living arrangements for the past 2 months: Single Family Home                                      Prior Living Arrangements/Services Living arrangements for the past 2 months: Single Family Home     Do you feel safe going back to the place where you live?: Yes          Current home services: DME (scale)    Activities of Daily Living      Permission Sought/Granted Permission sought to share information with : Case Manager                Emotional Assessment   Attitude/Demeanor/Rapport: Engaged Affect (typically observed): Accepting Orientation: : Oriented to Self, Oriented to Place, Oriented to  Time, Oriented to Situation   Psych Involvement: No (comment)  Admission diagnosis:  Acute on chronic systolic heart failure (HCC) [I50.23] Patient Active Problem List   Diagnosis Date Noted   Acute on chronic systolic heart failure (HCC) 07/03/2022   DOE (dyspnea on exertion) 05/16/2022   Combined systolic and diastolic congestive heart failure (HCC) 05/06/2022   Acute respiratory failure with hypoxia (HCC) 05/06/2022   Elevated troponin 05/06/2022   Iron deficiency anemia 05/06/2022   History of DVT (deep vein thrombosis) 05/06/2022   Chronic anticoagulation 05/06/2022   Thrombocytopenia (HCC)  05/06/2022   Paranoid schizophrenia (HCC) 05/06/2022   Unintentional weight loss 11/08/2020   Pancreatic lesion 11/08/2020   Increased ammonia level 11/08/2020   Abdominal pain 11/08/2020   Diarrhea 11/08/2020   Indigestion 11/08/2020   Abnormality of abdominal aorta 11/08/2020   Hyperlipidemia LDL goal <70    Hemorrhoids 04/18/2015   Colon polyps 04/18/2015   Rectal bleed 11/08/2014   CKD (chronic kidney disease) stage 3, GFR 30-59 ml/min (HCC) 11/08/2014   Hyperglycemia 11/08/2014   GERD without esophagitis 11/08/2014   Obesity 11/08/2014   Rectal bleeding 11/08/2014   Acute deep vein thrombosis (DVT) of femoral vein of right lower extremity (HCC) 10/18/2014   Chronic systolic heart failure (HCC) 12/09/2013   Cardiomyopathy, ischemic 06/07/2013   Mitral valve disorder    Coronary atherosclerosis of native coronary artery    Hypertension    Hyperlipidemia    PCP:  [name], MD Pharmacy:   [facility] [phone] - [place], [place] - [address] AT [facility] [address] [place] [address] Phone: [phone] Fax: [phone]  [facility] - [place], [place] - [address] [place]  [address] Phone: [phone] Fax: [phone]  [facility] - [place], [place] - [address] [place] [address] Phone: [phone] Fax: [phone]  [name] [address] [place] [address] Phone: [phone] Fax: [phone]     Social Determinants of Health (SDOH) Social History: SDOH Screenings   Food Insecurity: No Food Insecurity (05/07/2022)  Housing: Low Risk  (05/07/2022)  Transportation Needs: No Transportation Needs (05/07/2022)  Utilities: Not At Risk (05/07/2022)  Tobacco Use: Medium Risk (07/04/2022)   SDOH Interventions:     Readmission Risk Interventions      No data to display

## 2022-07-05 DIAGNOSIS — I255 Ischemic cardiomyopathy: Secondary | ICD-10-CM

## 2022-07-05 LAB — BASIC METABOLIC PANEL
Anion gap: 11 (ref 5–15)
BUN: 31 mg/dL — ABNORMAL HIGH (ref 8–23)
CO2: 24 mmol/L (ref 22–32)
Calcium: 8.7 mg/dL — ABNORMAL LOW (ref 8.9–10.3)
Chloride: 108 mmol/L (ref 98–111)
Creatinine, Ser: 2.97 mg/dL — ABNORMAL HIGH (ref 0.61–1.24)
GFR, Estimated: 23 mL/min — ABNORMAL LOW (ref >60)
Glucose, Bld: 85 mg/dL (ref 70–99)
Potassium: 3.4 mmol/L — ABNORMAL LOW (ref 3.5–5.1)
Sodium: 143 mmol/L (ref 135–145)

## 2022-07-05 LAB — MAGNESIUM: Magnesium: 1.9 mg/dL (ref 1.7–2.4)

## 2022-07-05 MED ORDER — ISOSORB DINITRATE-HYDRALAZINE 20-37.5 MG PO TABS
2.0000 | ORAL_TABLET | Freq: Three times a day (TID) | ORAL | Status: DC
  Administered 2022-07-05 – 2022-07-10 (×15): 2 via ORAL
  Filled 2022-07-05 (×15): qty 2

## 2022-07-05 NOTE — Progress Notes (Signed)
Rounding Note    Patient Name: Glenn Hawkins Date of Encounter: 07/05/2022  [facility] Cardiologist: [name], MD   Subjective   No issues this AM. Eating lunch. Inquiring about plans for mitraclip. Wants his sister to be involved  Inpatient Medications    Scheduled Meds:  apixaban  2.5 mg Oral BID   aspirin EC  81 mg Oral Daily   atorvastatin  80 mg Oral Daily   calcitRIOL  0.25 mcg Oral Q M,W,F   colchicine  0.6 mg Oral BID   furosemide  80 mg Intravenous BID   isosorbide-hydrALAZINE  1 tablet Oral TID   melatonin  3 mg Oral QHS   metoprolol succinate  100 mg Oral Daily   OLANZapine  15 mg Oral QHS   OLANZapine  5 mg Oral BH-q7a   pantoprazole  40 mg Oral Daily   potassium chloride  20 mEq Oral BID   risperiDONE  1 mg Oral QHS   sertraline  100 mg Oral Daily   sodium bicarbonate  650 mg Oral BID   sodium chloride flush  3 mL Intravenous Q12H   sodium chloride flush  3 mL Intravenous Q12H   Continuous Infusions:  sodium chloride     PRN Meds: sodium chloride, acetaminophen, diazepam, ondansetron (ZOFRAN) IV, oxyCODONE, sodium chloride flush   Vital Signs    Vitals:   07/05/22 0806 07/05/22 0807 07/05/22 0808 07/05/22 0809  BP:    (!) 163/93  Pulse: (!) 118 (!) 59 81 79  Resp: 17 (!) 27 16 20   Temp:    97.6 F (36.4 C)  TempSrc:    Oral  SpO2: (!) 81% (!) 84% 95% 97%  Weight:      Height:        Intake/Output Summary (Last 24 hours) at 07/05/2022 1247 Last data filed at 07/05/2022 1240 Gross per 24 hour  Intake 240 ml  Output 4475 ml  Net -4235 ml      07/05/2022    6:13 AM 07/04/2022    9:50 AM 07/03/2022    9:20 AM  Last 3 Weights  Weight (lbs) 187 lb 8 oz 194 lb 6.4 oz 200 lb  Weight (kg) 85.049 kg 88.179 kg 90.719 kg      Telemetry    NSR - Personally Reviewed  ECG    No new - Personally Reviewed  Physical Exam   Vitals:   07/05/22 0808 07/05/22 0809  BP:  (!) 163/93  Pulse: 81 79  Resp: 16 20  Temp:  97.6 F  (36.4 C)  SpO2: 95% 97%    GEN: No acute distress.   Neck: No sig JVD Cardiac: RRR, no murmurs, rubs, or gallops.  Respiratory: nl wob, decreased BS GI: Soft, nontender, mild distensions MS: No edema; No deformity. Neuro:  Nonfocal  Psych: Normal affect   Labs    High Sensitivity Troponin:  No results for input(s): "TROPONINIHS" in the last 720 hours.   Chemistry Recent Labs  Lab 07/04/22 0217 07/04/22 1006 07/04/22 1439 07/05/22 0219  NA 140  --  146* 143  K 3.0*  --  3.3* 3.4*  CL 109  --  111 108  CO2 23  --  25 24  GLUCOSE 114*  --  141* 85  BUN 30*  --  31* 31*  CREATININE 2.82*  --  2.89* 2.97*  CALCIUM 8.2*  --  8.5* 8.7*  MG  --  1.6*  --  1.9  ALBUMIN  --   --  3.0*  --   GFRNONAA 24*  --  23* 23*  ANIONGAP 8  --  10 11    Lipids  Recent Labs  Lab 07/04/22 1007  CHOL 109  TRIG 62  HDL 42  LDLCALC 55  CHOLHDL 2.6    Hematology Recent Labs  Lab 07/03/22 1536 07/03/22 1541  HGB 11.2*  10.9* 11.2*  HCT 33.0*  32.0* 33.0*   Thyroid No results for input(s): "TSH", "FREET4" in the last 168 hours.  BNPNo results for input(s): "BNP", "PROBNP" in the last 168 hours.  DDimer No results for input(s): "DDIMER" in the last 168 hours.   Radiology    CARDIAC CATHETERIZATION  Result Date: 07/03/2022 1.  Patent left main stem and LAD without significant stenosis 2.  Subtotal occlusion of the mid circumflex and the mid RCA, RCA collateralized from a septal perforating cascade of the LAD 3.  Severely elevated LVEDP of 29 mmHg 4.  Elevated right heart pressures: RA mean 20 mmHg PA 74/39 mean 50 mmHg Wedge pressure mean 30, V wave 40 mmHg Cardiac output 6.57 L/min, cardiac index 3.21 Transpulmonic gradient 20 mmHg, PVR 3 Wood units Recommend: Hospital admission for IV diuresis, heart failure consultation, consider transcatheter edge-to-edge repair of the mitral valve following optimization of medical therapy. Medical therapy for CAD.    Cardiac Studies  LHC/RHC  07/03/2022 1.  Patent left main stem and LAD without significant stenosis 2.  Subtotal occlusion of the mid circumflex and the mid RCA, RCA collateralized from a septal perforating cascade of the LAD 3.  Severely elevated LVEDP of 29 mmHg 4.  Elevated right heart pressures: RA mean 20 mmHg PA 74/39 mean 50 mmHg Wedge pressure mean 30, V wave 40 mmHg Cardiac output 6.57 L/min, cardiac index 3.21 Transpulmonic gradient 20 mmHg, PVR 3 Wood units   Recommend: Hospital admission for IV diuresis, heart failure consultation, consider transcatheter edge-to-edge repair of the mitral valve following optimization of medical therapy. Medical therapy for CAD.   TTE 05/25/2022  1. Left ventricular ejection fraction, by estimation, is 35 to 40%. The  left ventricle has moderately decreased function. The left ventricle  demonstrates regional wall motion abnormalities (see scoring  diagram/findings for description). The left  ventricular internal cavity size was moderately to severely dilated. Left  ventricular diastolic parameters are indeterminate.   2. Right ventricular systolic function is mildly reduced. The right  ventricular size is normal. There is mildly elevated pulmonary artery  systolic pressure. The estimated right ventricular systolic pressure is  41.2 mmHg.   3. Left atrial size was severely dilated.   4. A small pericardial effusion is present. The pericardial effusion is  localized near the right atrium.   5. 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 There  is splay artifact. The mitral valve is grossly normal. Severe mitral valve  regurgitation. No evidence of  mitral stenosis.   6. The aortic valve is tricuspid. There is mild calcification of the  aortic valve. Aortic valve regurgitation is mild to moderate. No aortic  stenosis is present.   7. The inferior vena cava is dilated in size with  >50% respiratory  variability, suggesting right atrial pressure of 8 mmHg.   Patient Profile     67 y.o. male hx of inferior MI, stage IV CKD, DVT August 2016, PAD, ischemic cardiomyopathy and chronic systolic heart failure,  severe functional MR admitted for decompensated CHF with  elevated mean PA, wedge pressures on RHC  Assessment & Plan    Ischemic CM with decompensated CHF Severe MR - plan to discuss edge to edge mitraclip after diuresis. Patient would like his sister to be involved, her # is in the EMR - continue IV lasix 80 mg BID - net negative 3L,  crt 2.8->2.97 stable - increased bidil dose, - continue BB - GDMT limited with GFR - K>4, Mg>2   PAD - on eliquis, asa 81 mg daily  HLD -continue lipitor 80 mg daily  For questions or updates, please contact [facility] Please consult [email or web address] for contact info under        Signed, Maisie Fus, MD  07/05/2022, 12:47 PM

## 2022-07-06 DIAGNOSIS — I255 Ischemic cardiomyopathy: Secondary | ICD-10-CM | POA: Diagnosis not present

## 2022-07-06 LAB — BASIC METABOLIC PANEL
Anion gap: 12 (ref 5–15)
BUN: 35 mg/dL — ABNORMAL HIGH (ref 8–23)
CO2: 29 mmol/L (ref 22–32)
Calcium: 9.2 mg/dL (ref 8.9–10.3)
Chloride: 101 mmol/L (ref 98–111)
Creatinine, Ser: 3.15 mg/dL — ABNORMAL HIGH (ref 0.61–1.24)
GFR, Estimated: 21 mL/min — ABNORMAL LOW (ref >60)
Glucose, Bld: 114 mg/dL — ABNORMAL HIGH (ref 70–99)
Potassium: 3 mmol/L — ABNORMAL LOW (ref 3.5–5.1)
Sodium: 142 mmol/L (ref 135–145)

## 2022-07-06 MED ORDER — POTASSIUM CHLORIDE CRYS ER 20 MEQ PO TBCR
40.0000 meq | EXTENDED_RELEASE_TABLET | Freq: Once | ORAL | Status: AC
  Administered 2022-07-06: 40 meq via ORAL
  Filled 2022-07-06: qty 2

## 2022-07-06 MED ORDER — COLCHICINE 0.6 MG PO TABS
0.6000 mg | ORAL_TABLET | Freq: Two times a day (BID) | ORAL | Status: DC
  Administered 2022-07-07: 0.6 mg via ORAL
  Filled 2022-07-06: qty 1

## 2022-07-06 NOTE — Progress Notes (Signed)
Rounding Note    Patient Name: Glenn Hawkins Date of Encounter: 07/06/2022  [facility] Cardiologist: [name], MD   Subjective   Sitting in his chair. No issues today. Would like to know the plan  Inpatient Medications    Scheduled Meds:  apixaban  2.5 mg Oral BID   aspirin EC  81 mg Oral Daily   atorvastatin  80 mg Oral Daily   calcitRIOL  0.25 mcg Oral Q M,W,F   colchicine  0.6 mg Oral BID   furosemide  80 mg Intravenous BID   isosorbide-hydrALAZINE  2 tablet Oral TID   melatonin  3 mg Oral QHS   metoprolol succinate  100 mg Oral Daily   OLANZapine  15 mg Oral QHS   OLANZapine  5 mg Oral BH-q7a   pantoprazole  40 mg Oral Daily   potassium chloride  20 mEq Oral BID   risperiDONE  1 mg Oral QHS   sertraline  100 mg Oral Daily   sodium bicarbonate  650 mg Oral BID   sodium chloride flush  3 mL Intravenous Q12H   sodium chloride flush  3 mL Intravenous Q12H   Continuous Infusions:  sodium chloride     PRN Meds: sodium chloride, acetaminophen, diazepam, ondansetron (ZOFRAN) IV, oxyCODONE, sodium chloride flush   Vital Signs    Vitals:   07/05/22 2018 07/06/22 0410 07/06/22 0726 07/06/22 0920  BP: (!) 143/85 (!) 134/97    Pulse: 80 81    Resp: 18 18    Temp: 97.7 F (36.5 C)     TempSrc: Oral   Oral  SpO2: 100% 96%    Weight:   85 kg   Height:        Intake/Output Summary (Last 24 hours) at 07/06/2022 1141 Last data filed at 07/06/2022 1000 Gross per 24 hour  Intake 240 ml  Output 3575 ml  Net -3335 ml      07/06/2022    7:26 AM 07/05/2022    6:13 AM 07/04/2022    9:50 AM  Last 3 Weights  Weight (lbs) 187 lb 6.3 oz 187 lb 8 oz 194 lb 6.4 oz  Weight (kg) 85 kg 85.049 kg 88.179 kg      Telemetry    NSR - Personally Reviewed  ECG    No new - Personally Reviewed  Physical Exam   Vitals:   07/05/22 2018 07/06/22 0410  BP: (!) 143/85 (!) 134/97  Pulse: 80 81  Resp: 18 18  Temp: 97.7 F (36.5 C)   SpO2: 100% 96%    GEN:  No acute distress.   Neck: No sig JVD Cardiac: RRR, no murmurs, rubs, or gallops.  Respiratory: nl wob,  good air movement BL GI: Soft, nontender, mild distensions MS: No edema; No deformity. Neuro:  Nonfocal  Psych: Normal affect   Labs    High Sensitivity Troponin:  No results for input(s): "TROPONINIHS" in the last 720 hours.   Chemistry Recent Labs  Lab 07/04/22 1006 07/04/22 1439 07/05/22 0219 07/06/22 0233  NA  --  146* 143 142  K  --  3.3* 3.4* 3.0*  CL  --  111 108 101  CO2  --  25 24 29   GLUCOSE  --  141* 85 114*  BUN  --  31* 31* 35*  CREATININE  --  2.89* 2.97* 3.15*  CALCIUM  --  8.5* 8.7* 9.2  MG 1.6*  --  1.9  --   ALBUMIN  --  3.0*  --   --   GFRNONAA  --  23* 23* 21*  ANIONGAP  --  10 11 12     Lipids  Recent Labs  Lab 07/04/22 1007  CHOL 109  TRIG 62  HDL 42  LDLCALC 55  CHOLHDL 2.6    Hematology Recent Labs  Lab 07/03/22 1536 07/03/22 1541  HGB 11.2*  10.9* 11.2*  HCT 33.0*  32.0* 33.0*   Thyroid No results for input(s): "TSH", "FREET4" in the last 168 hours.  BNPNo results for input(s): "BNP", "PROBNP" in the last 168 hours.  DDimer No results for input(s): "DDIMER" in the last 168 hours.   Radiology    No results found.  Cardiac Studies  LHC/RHC 07/03/2022 1.  Patent left main stem and LAD without significant stenosis 2.  Subtotal occlusion of the mid circumflex and the mid RCA, RCA collateralized from a septal perforating cascade of the LAD 3.  Severely elevated LVEDP of 29 mmHg 4.  Elevated right heart pressures: RA mean 20 mmHg PA 74/39 mean 50 mmHg Wedge pressure mean 30, V wave 40 mmHg Cardiac output 6.57 L/min, cardiac index 3.21 Transpulmonic gradient 20 mmHg, PVR 3 Wood units   Recommend: Hospital admission for IV diuresis, heart failure consultation, consider transcatheter edge-to-edge repair of the mitral valve following optimization of medical therapy. Medical therapy for CAD.   TTE 05/25/2022  1. Left ventricular  ejection fraction, by estimation, is 35 to 40%. The  left ventricle has moderately decreased function. The left ventricle  demonstrates regional wall motion abnormalities (see scoring  diagram/findings for description). The left  ventricular internal cavity size was moderately to severely dilated. Left  ventricular diastolic parameters are indeterminate.   2. Right ventricular systolic function is mildly reduced. The right  ventricular size is normal. There is mildly elevated pulmonary artery  systolic pressure. The estimated right ventricular systolic pressure is  41.2 mmHg.   3. Left atrial size was severely dilated.   4. A small pericardial effusion is present. The pericardial effusion is  localized near the right atrium.   5. 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 There  is splay artifact. The mitral valve is grossly normal. Severe mitral valve  regurgitation. No evidence of  mitral stenosis.   6. The aortic valve is tricuspid. There is mild calcification of the  aortic valve. Aortic valve regurgitation is mild to moderate. No aortic  stenosis is present.   7. The inferior vena cava is dilated in size with >50% respiratory  variability, suggesting right atrial pressure of 8 mmHg.   Patient Profile     67 y.o. male hx of inferior MI, stage IV CKD, DVT August 2016, PAD, ischemic cardiomyopathy and chronic systolic heart failure,  severe functional MR admitted for decompensated CHF with elevated mean PA, wedge pressures on RHC  Assessment & Plan    Ischemic CM with decompensated CHF Severe MR -Wedge 30/ V wave 40 ; 5/9 - plan to discuss edge to edge mitraclip after diuresis. Patient would like his sister to be involved, her # is in the EMR - continue IV lasix 80 mg BID - net negative 3.6L,  crt 2.8->2.9->3.1 - increased bidil dose 5/11 - continue BB - GDMT limited with GFR - K>4,  Mg>2   PAD - on eliquis, asa 81 mg daily  HLD -continue lipitor 80 mg daily  For questions or updates, please contact [facility] Please consult [email or web address] for contact info under        Signed, Maisie Fus, MD  07/06/2022, 11:41 AM

## 2022-07-07 DIAGNOSIS — I5023 Acute on chronic systolic (congestive) heart failure: Secondary | ICD-10-CM | POA: Diagnosis not present

## 2022-07-07 LAB — BASIC METABOLIC PANEL
Anion gap: 10 (ref 5–15)
BUN: 46 mg/dL — ABNORMAL HIGH (ref 8–23)
CO2: 26 mmol/L (ref 22–32)
Calcium: 9.3 mg/dL (ref 8.9–10.3)
Chloride: 101 mmol/L (ref 98–111)
Creatinine, Ser: 3.61 mg/dL — ABNORMAL HIGH (ref 0.61–1.24)
GFR, Estimated: 18 mL/min — ABNORMAL LOW (ref >60)
Glucose, Bld: 91 mg/dL (ref 70–99)
Potassium: 3.5 mmol/L (ref 3.5–5.1)
Sodium: 137 mmol/L (ref 135–145)

## 2022-07-07 MED ORDER — COLCHICINE 0.3 MG HALF TABLET
0.3000 mg | ORAL_TABLET | Freq: Every day | ORAL | Status: DC
  Administered 2022-07-08 – 2022-07-10 (×3): 0.3 mg via ORAL
  Filled 2022-07-07 (×3): qty 1

## 2022-07-07 MED ORDER — ORAL CARE MOUTH RINSE: 15.0000 mL | OROMUCOSAL | Status: DC | PRN

## 2022-07-07 NOTE — Care Management Important Message (Signed)
Important Message  Patient Details  Name: Glenn Hawkins MRN: [id number] Date of Birth: Jan 02, 1956   Medicare Important Message Given:  Yes     Renie Ora 07/07/2022, 11:18 AM

## 2022-07-07 NOTE — Progress Notes (Addendum)
Advanced Heart Failure Rounding Note  PCP-Cardiologist: [name], MD   Subjective:     Feels great. Denies dyspnea. No concerns.  Weight down 15#.   Scr trending up.    Objective:   Weight Range: 84 kg Body mass index is 28.16 kg/m.   Vital Signs:   Temp:  [97.9 F (36.6 C)-98.7 F (37.1 C)] 98.1 F (36.7 C) (05/13 0825) Pulse Rate:  [87-92] 89 (05/13 0825) Resp:  [10-19] 10 (05/13 0825) BP: (124-149)/(80-102) 138/91 (05/13 0844) SpO2:  [95 %-98 %] 95 % (05/13 0422) Weight:  [84 kg] 84 kg (05/13 0422) Last BM Date : 07/05/22  Weight change: Filed Weights   07/05/22 0613 07/06/22 0726 07/07/22 0422  Weight: 85 kg 85 kg 84 kg    Intake/Output:   Intake/Output Summary (Last 24 hours) at 07/07/2022 1123 Last data filed at 07/06/2022 2029 Gross per 24 hour  Intake 240 ml  Output 900 ml  Net -660 ml      Physical Exam    General:  Well appearing. HEENT: Normal Neck: Supple. JVP not elevated . Carotids 2+ bilat; no bruits.  Cor: PMI nondisplaced. Regular rate & rhythm. No rubs, gallops or murmurs. Lungs: Clear Abdomen: Soft, nontender, nondistended.  Extremities: No cyanosis, clubbing, rash, edema Neuro: Alert & orientedx3. Affect pleasant   Telemetry   SR 80s  Labs    CBC No results for input(s): "WBC", "NEUTROABS", "HGB", "HCT", "MCV", "PLT" in the last 72 hours. Basic Metabolic Panel Recent Labs    16/10/96 1439 07/05/22 0219 07/06/22 0233 07/07/22 0242  NA 146* 143 142 137  K 3.3* 3.4* 3.0* 3.5  CL 111 108 101 101  CO2 25 24 29 26   GLUCOSE 141* 85 114* 91  BUN 31* 31* 35* 46*  CREATININE 2.89* 2.97* 3.15* 3.61*  CALCIUM 8.5* 8.7* 9.2 9.3  MG  --  1.9  --   --   PHOS 2.3*  --   --   --    Liver Function Tests Recent Labs    07/04/22 1439  ALBUMIN 3.0*   No results for input(s): "LIPASE", "AMYLASE" in the last 72 hours. Cardiac Enzymes No results for input(s): "CKTOTAL", "CKMB", "CKMBINDEX", "TROPONINI" in the last 72  hours.  BNP: BNP (last 3 results) Recent Labs    05/06/22 0358  BNP 1,699.1*    ProBNP (last 3 results) No results for input(s): "PROBNP" in the last 8760 hours.   D-Dimer No results for input(s): "DDIMER" in the last 72 hours. Hemoglobin A1C No results for input(s): "HGBA1C" in the last 72 hours. Fasting Lipid Panel No results for input(s): "CHOL", "HDL", "LDLCALC", "TRIG", "CHOLHDL", "LDLDIRECT" in the last 72 hours. Thyroid Function Tests No results for input(s): "TSH", "T4TOTAL", "T3FREE", "THYROIDAB" in the last 72 hours.  Invalid input(s): "FREET3"  Other results:   Imaging    No results found.   Medications:     Scheduled Medications:  apixaban  2.5 mg Oral BID   aspirin EC  81 mg Oral Daily   atorvastatin  80 mg Oral Daily   calcitRIOL  0.25 mcg Oral Q M,W,F   colchicine  0.6 mg Oral BID   furosemide  80 mg Intravenous BID   isosorbide-hydrALAZINE  2 tablet Oral TID   melatonin  3 mg Oral QHS   metoprolol succinate  100 mg Oral Daily   OLANZapine  15 mg Oral QHS   OLANZapine  5 mg Oral BH-q7a   pantoprazole  40 mg  Oral Daily   potassium chloride  20 mEq Oral BID   risperiDONE  1 mg Oral QHS   sertraline  100 mg Oral Daily   sodium bicarbonate  650 mg Oral BID   sodium chloride flush  3 mL Intravenous Q12H   sodium chloride flush  3 mL Intravenous Q12H    Infusions:  sodium chloride      PRN Medications: sodium chloride, acetaminophen, diazepam, ondansetron (ZOFRAN) IV, mouth rinse, oxyCODONE, sodium chloride flush    Patient Profile   67 y.o  male with hx CAD, chronic systolic CHF/iCM, severe MR, CKD IV, schizophrenia. Presented for RHC and found to be significantly volume overloaded >> admitted for IV diuresis and evaluation for possible mTEER.   Assessment/Plan   Acute on chronic systolic CHFiCM: - Prior inferior MI in 2013. EF has been reduced since then - Echo 07/21: EF 30-35% - Echo 03/24: EF 35-40%, RV mildly reduced, severe  LAE, severe MR. - TEE 04/24: EF 35-40%, RV mildly reduced, severe ischemic MR - R/LHC: Subtotal occlusion m LCX and m RCA, RCA collateralized by LAD, RA mean 20, PA mean 50, PCWP 30 with V waves to 40, LVEDP 29, CO/CI 6.57/3.21.  - NYHA IIIb - Diuresed 15 lb. Scr now trending. Does not appear overloaded on exam. Stop IV lasix. - Continue metoprolol xl 100 mg daily - Continue bidil  2 tabs TID - BP elevated but will not treat aggressively in setting of AKI   2. Severe MR: -Has been seen by structural team -Considering mTEER once HF management optimized   3. CAD: -Hx inferior MI in 2013 -LHC 07/03/22: Subtotal occlusion m LCX and m RCA, RCA collateralized by LAD, LAD okay -On rosuvastatin 20 mg daily at home. Switched to atorvastatin 80 given renal dysfunction.    4. AKI on CKD IV: -Baseline seems to be 2.8-3.2 -Scr 2.9>>3.61 -In setting of diuresis + ? CIN - Stop diuretics. Will need to watch next 1-2 days -Sees [facility]   5. Iron deficiency anemia: -Hgb stable 10.5, improved from 7s in March. Has been treated with feraheme   6. Schizophrenia   7. Hypokalemia: -K 3.5 -Supp  8. Hx DVT: -Anticoagulated with Eliquis 2.5 BID   Length of Stay: 3  FINCH, LINDSAY N, PA-C  07/07/2022, 11:23 AM  Advanced Heart Failure Team Pager [phone] (M-F; 7a - 5p)  Please contact [facility] for night-coverage after hours (5p -7a ) and weekends on [email or web address]   Patient seen with PA, agree with the above note.   He has diuresed well, weight down 15 lbs.  However, creatinine up to 3.61 today.    He denies dyspnea.  SBP 130s.  General: NAD Neck: No JVD, no thyromegaly or thyroid nodule.  Lungs: Clear to auscultation bilaterally with normal respiratory effort. CV: Nondisplaced PMI.  Heart regular S1/S2, no S3/S4, 2/6 HSM apex.  No peripheral edema.   Abdomen: Soft, nontender, no hepatosplenomegaly, no distention.  Skin: Intact without lesions or rashes.  Neurologic: Alert and  oriented x 3.  Psych: Normal affect. Extremities: No clubbing or cyanosis.  HEENT: Normal.   Volume status much better but has developed AKI on CKD stage 3.  TEE with EF 35-40%, severe infarct-related MR.  - Stop IV Lasix (had dose this morning).  - Continue Bidil 2 tabs tid.  - Continue Toprol XL 100 mg daily.  - Follow creatinine, will need to restart po regimen when it stabilizes.   He will need Mitraclip, will plan  to schedule as outpatient once he is stabilized this admission and goes home.   Subtotal LCx and RCA occlusions, medical management for now.   AKI in setting of diuresis and contrast load with cath on Friday.  Baseline creatinine around 2.3.    Can go home once creatinine stabilizes and we get him on home diuretic regimen.   Marca Ancona 07/07/2022 11:43 AM

## 2022-07-07 NOTE — Progress Notes (Signed)
Patient educated on importance of staying connected to his heart monitor, even as he travels to the rest room. Needs reinforcement.

## 2022-07-08 ENCOUNTER — Encounter (HOSPITAL_COMMUNITY): Payer: Medicare Other | Admitting: Cardiology

## 2022-07-08 ENCOUNTER — Encounter: Payer: Self-pay | Admitting: Cardiovascular Disease

## 2022-07-08 DIAGNOSIS — I5023 Acute on chronic systolic (congestive) heart failure: Secondary | ICD-10-CM | POA: Diagnosis not present

## 2022-07-08 LAB — BASIC METABOLIC PANEL
Anion gap: 13 (ref 5–15)
BUN: 62 mg/dL — ABNORMAL HIGH (ref 8–23)
CO2: 26 mmol/L (ref 22–32)
Calcium: 8.8 mg/dL — ABNORMAL LOW (ref 8.9–10.3)
Chloride: 100 mmol/L (ref 98–111)
Creatinine, Ser: 4.05 mg/dL — ABNORMAL HIGH (ref 0.61–1.24)
GFR, Estimated: 16 mL/min — ABNORMAL LOW (ref >60)
Glucose, Bld: 133 mg/dL — ABNORMAL HIGH (ref 70–99)
Potassium: 3.3 mmol/L — ABNORMAL LOW (ref 3.5–5.1)
Sodium: 139 mmol/L (ref 135–145)

## 2022-07-08 MED ORDER — POTASSIUM CHLORIDE CRYS ER 20 MEQ PO TBCR
20.0000 meq | EXTENDED_RELEASE_TABLET | Freq: Once | ORAL | Status: AC
  Administered 2022-07-08: 20 meq via ORAL
  Filled 2022-07-08: qty 1

## 2022-07-08 NOTE — TOC Progression Note (Signed)
Transition of Care [facility]) - Progression Note    Patient Details  Name: Glenn Hawkins MRN: [id number] Date of Birth: Feb 02, 1956  [facility]) CM/SW Contact  Elliot Cousin, RN Phone Number: [phone] 07/08/2022, 12:06 PM  Clinical Narrative:   HF TOC spoke to pt at bedside. States his sister will provide transportation home at [employer]. Scheduled dc on 07/09/2022.   Appt arranged with PCP, Dr Sherril Croon for 07/16/2022 at 10:00 am.  Pt states he drives to his appts.     Expected Discharge Plan: Home/Self Care Barriers to Discharge: No Barriers Identified  Expected Discharge Plan and Services   Discharge Planning Services: CM Consult   Living arrangements for the past 2 months: Single Family Home                                       Social Determinants of Health (SDOH) Interventions SDOH Screenings   Food Insecurity: No Food Insecurity (05/07/2022)  Housing: Low Risk  (05/07/2022)  Transportation Needs: No Transportation Needs (05/07/2022)  Utilities: Not At Risk (05/07/2022)  Tobacco Use: Medium Risk (07/04/2022)    Readmission Risk Interventions     No data to display

## 2022-07-08 NOTE — Progress Notes (Addendum)
Advanced Heart Failure Rounding Note  PCP-Cardiologist: [name], MD   Subjective:      Weight down 15#.   Lab Results  Component Value Date   CREATININE 4.05 (H) 07/08/2022   CREATININE 3.61 (H) 07/07/2022   CREATININE 3.15 (H) 07/06/2022   Denies SOB.    Objective:   Weight Range: 82.7 kg Body mass index is 27.73 kg/m.   Vital Signs:   Temp:  [98.1 F (36.7 C)-98.3 F (36.8 C)] 98.3 F (36.8 C) (05/14 0438) Pulse Rate:  [82-92] 82 (05/14 0438) Resp:  [15-16] 16 (05/14 0438) BP: (136-144)/(80-86) 140/80 (05/14 0438) SpO2:  [96 %-97 %] 96 % (05/14 0438) Weight:  [82.7 kg] 82.7 kg (05/14 0438) Last BM Date : 07/05/22  Weight change: Filed Weights   07/06/22 0726 07/07/22 0422 07/08/22 0438  Weight: 85 kg 84 kg 82.7 kg    Intake/Output:  No intake or output data in the 24 hours ending 07/08/22 1021     Physical Exam    General:  Well appearing. HEENT: Normal Neck: Supple. JVP not elevated . Carotids 2+ bilat; no bruits.  Cor: PMI nondisplaced. Regular rate & rhythm. No rubs, gallops or murmurs. Lungs: Clear Abdomen: Soft, nontender, nondistended.  Extremities: No cyanosis, clubbing, rash, edema Neuro: Alert & orientedx3. Affect pleasant   Telemetry   SR 80-90s   Labs    CBC No results for input(s): "WBC", "NEUTROABS", "HGB", "HCT", "MCV", "PLT" in the last 72 hours. Basic Metabolic Panel Recent Labs    82/95/62 0242 07/08/22 0235  NA 137 139  K 3.5 3.3*  CL 101 100  CO2 26 26  GLUCOSE 91 133*  BUN 46* 62*  CREATININE 3.61* 4.05*  CALCIUM 9.3 8.8*   Liver Function Tests No results for input(s): "AST", "ALT", "ALKPHOS", "BILITOT", "PROT", "ALBUMIN" in the last 72 hours.  No results for input(s): "LIPASE", "AMYLASE" in the last 72 hours. Cardiac Enzymes No results for input(s): "CKTOTAL", "CKMB", "CKMBINDEX", "TROPONINI" in the last 72 hours.  BNP: BNP (last 3 results) Recent Labs    05/06/22 0358  BNP 1,699.1*     ProBNP (last 3 results) No results for input(s): "PROBNP" in the last 8760 hours.   D-Dimer No results for input(s): "DDIMER" in the last 72 hours. Hemoglobin A1C No results for input(s): "HGBA1C" in the last 72 hours. Fasting Lipid Panel No results for input(s): "CHOL", "HDL", "LDLCALC", "TRIG", "CHOLHDL", "LDLDIRECT" in the last 72 hours. Thyroid Function Tests No results for input(s): "TSH", "T4TOTAL", "T3FREE", "THYROIDAB" in the last 72 hours.  Invalid input(s): "FREET3"  Other results:   Imaging    No results found.   Medications:     Scheduled Medications:  apixaban  2.5 mg Oral BID   aspirin EC  81 mg Oral Daily   atorvastatin  80 mg Oral Daily   calcitRIOL  0.25 mcg Oral Q M,W,F   colchicine  0.3 mg Oral Daily   isosorbide-hydrALAZINE  2 tablet Oral TID   melatonin  3 mg Oral QHS   metoprolol succinate  100 mg Oral Daily   OLANZapine  15 mg Oral QHS   OLANZapine  5 mg Oral BH-q7a   pantoprazole  40 mg Oral Daily   risperiDONE  1 mg Oral QHS   sertraline  100 mg Oral Daily   sodium bicarbonate  650 mg Oral BID   sodium chloride flush  3 mL Intravenous Q12H   sodium chloride flush  3 mL Intravenous Q12H  Infusions:  sodium chloride      PRN Medications: sodium chloride, acetaminophen, diazepam, ondansetron (ZOFRAN) IV, mouth rinse, oxyCODONE, sodium chloride flush    Patient Profile   67 y.o  male with hx CAD, chronic systolic CHF/iCM, severe MR, CKD IV, schizophrenia. Presented for RHC and found to be significantly volume overloaded >> admitted for IV diuresis and evaluation for possible mTEER.   Assessment/Plan   Acute on chronic systolic CHFiCM: - Prior inferior MI in 2013. EF has been reduced since then - Echo 07/21: EF 30-35% - Echo 03/24: EF 35-40%, RV mildly reduced, severe LAE, severe MR. - TEE 04/24: EF 35-40%, RV mildly reduced, severe ischemic MR - R/LHC: Subtotal occlusion m LCX and m RCA, RCA collateralized by LAD, RA mean  20, PA mean 50, PCWP 30 with V waves to 40, LVEDP 29, CO/CI 6.57/3.21.  - NYHA IIIb. Diuretics stopped yesterday. Weight down another 3 pounds.  - Continue to hold diuretics.  - Diuresed 18 lb. Continue metoprolol xl 100 mg daily - Continue bidil  2 tabs TID - BP elevated but will not treat aggressively in setting of AKI   2. Severe MR: -Has been seen by structural team -Considering mTEER once HF management optimized   3. CAD: -Hx inferior MI in 2013 -LHC 07/03/22: Subtotal occlusion m LCX and m RCA, RCA collateralized by LAD, LAD okay - No chest pain.  -On rosuvastatin 20 mg daily at home. Switched to atorvastatin 80 given renal dysfunction.    4. AKI on CKD IV: -Baseline seems to be 2.8-3.2 -Scr 2.9>>3.61>>4  -In setting of diuresis + ? CIN - Holding diuretics. Need to document urine output. No I/Os for last 24 hours.  -Sees [facility]   5. Iron deficiency anemia: -Hgb stable 10.5, improved from 7s in March. Has been treated with feraheme   6. Schizophrenia   7. Hypokalemia: -K 3.3 -Supp  8. Hx DVT: -Anticoagulated with Eliquis 2.5 BID  Strict I/Os  Length of Stay: 4  Amy Clegg, NP  07/08/2022, 10:21 AM  Advanced Heart Failure Team Pager [phone] (M-F; 7a - 5p)  Please contact [facility] for night-coverage after hours (5p -7a ) and weekends on [email or web address]  Patient seen with NP, agree with the above note.   No complaints today. Creatinine up again at 4.85. BP stable.   General: NAD Neck: No JVD, no thyromegaly or thyroid nodule.  Lungs: Clear to auscultation bilaterally with normal respiratory effort. CV: Nondisplaced PMI.  Heart regular S1/S2, no S3/S4, 1/6 HSM apex.  No peripheral edema.   Abdomen: Soft, nontender, no hepatosplenomegaly, no distention.  Skin: Intact without lesions or rashes.  Neurologic: Alert and oriented x 3.  Psych: Normal affect. Extremities: No clubbing or cyanosis.  HEENT: Normal.   Volume status much better but has  developed AKI on CKD stage 3, creatinine up to 4.85.  TEE with EF 35-40%, severe infarct-related MR.  - No diuretics today.   - Continue Bidil 2 tabs tid.  - Continue Toprol XL 100 mg daily.  - Follow creatinine, will need to restart po diuretic regimen eventually when it stabilizes.    He will need Mitraclip, will plan to schedule as outpatient once he is stabilized this admission and goes home.    Subtotal LCx and RCA occlusions, medical management for now.    AKI in setting of diuresis and contrast load with cath on Friday.  Baseline creatinine around 2.3, up to 4.85 now.  BP stable, continue to hold  diuretics.    Can go home once creatinine stabilizes and we get him on home diuretic regimen.   Marca Ancona 07/08/2022 3:03 PM

## 2022-07-09 ENCOUNTER — Encounter (HOSPITAL_COMMUNITY): Payer: Self-pay | Admitting: Cardiovascular Disease

## 2022-07-09 DIAGNOSIS — I5023 Acute on chronic systolic (congestive) heart failure: Secondary | ICD-10-CM | POA: Diagnosis not present

## 2022-07-09 DIAGNOSIS — N179 Acute kidney failure, unspecified: Secondary | ICD-10-CM | POA: Diagnosis not present

## 2022-07-09 LAB — BASIC METABOLIC PANEL
Anion gap: 14 (ref 5–15)
BUN: 75 mg/dL — ABNORMAL HIGH (ref 8–23)
CO2: 26 mmol/L (ref 22–32)
Calcium: 8.8 mg/dL — ABNORMAL LOW (ref 8.9–10.3)
Chloride: 99 mmol/L (ref 98–111)
Creatinine, Ser: 4.12 mg/dL — ABNORMAL HIGH (ref 0.61–1.24)
GFR, Estimated: 15 mL/min — ABNORMAL LOW (ref >60)
Glucose, Bld: 87 mg/dL (ref 70–99)
Potassium: 3.7 mmol/L (ref 3.5–5.1)
Sodium: 139 mmol/L (ref 135–145)

## 2022-07-09 NOTE — Plan of Care (Signed)
  Problem: Education: Goal: Understanding of CV disease, CV risk reduction, and recovery process will improve Outcome: Progressing Goal: Individualized Educational Video(s) Outcome: Progressing   Problem: Activity: Goal: Ability to return to baseline activity level will improve Outcome: Progressing   Problem: Cardiovascular: Goal: Ability to achieve and maintain adequate cardiovascular perfusion will improve Outcome: Progressing Goal: Vascular access site(s) Level 0-1 will be maintained Outcome: Progressing   Problem: Health Behavior/Discharge Planning: Goal: Ability to safely manage health-related needs after discharge will improve Outcome: Progressing   Problem: Health Behavior/Discharge Planning: Goal: Ability to manage health-related needs will improve Outcome: Progressing   Problem: Clinical Measurements: Goal: Ability to maintain clinical measurements within normal limits will improve Outcome: Progressing Goal: Will remain free from infection Outcome: Progressing Goal: Diagnostic test results will improve Outcome: Progressing Goal: Respiratory complications will improve Outcome: Progressing Goal: Cardiovascular complication will be avoided Outcome: Progressing   Problem: Activity: Goal: Risk for activity intolerance will decrease Outcome: Progressing   Problem: Nutrition: Goal: Adequate nutrition will be maintained Outcome: Progressing   Problem: Coping: Goal: Level of anxiety will decrease Outcome: Progressing   Problem: Elimination: Goal: Will not experience complications related to bowel motility Outcome: Progressing Goal: Will not experience complications related to urinary retention Outcome: Progressing   Problem: Pain Managment: Goal: General experience of comfort will improve Outcome: Progressing   Problem: Safety: Goal: Ability to remain free from injury will improve Outcome: Progressing   Problem: Skin Integrity: Goal: Risk for impaired  skin integrity will decrease Outcome: Progressing   Problem: Education: Goal: Ability to demonstrate management of disease process will improve Outcome: Progressing Goal: Ability to verbalize understanding of medication therapies will improve Outcome: Progressing Goal: Individualized Educational Video(s) Outcome: Progressing   Problem: Activity: Goal: Capacity to carry out activities will improve Outcome: Progressing   Problem: Cardiac: Goal: Ability to achieve and maintain adequate cardiopulmonary perfusion will improve Outcome: Progressing

## 2022-07-09 NOTE — Telephone Encounter (Signed)
Talked to Jae Dire, patient's sister. Questions regarding her brothers care answered. thx

## 2022-07-09 NOTE — Consult Note (Signed)
[facility] Customer service manager [facility]) Accountable Care Organization (ACO) [facility] Liaison Note  07/09/2022  Glenn Hawkins Jun 05, 1955 [id number]  Location: [facility] RN Hospital Liaison met patient at bedside at [facility].  Insurance: MCR ACO   Glenn Hawkins is a 67 y.o. male who is a Primary Care Patient of Vyas, Angelina Pih, MD [facility] Internal [facility]). The patient was screened for readmission hospitalization with noted medium risk score for unplanned readmission risk with 2 IP in 6 months.  The patient was assessed for potential [facility]) Care Management service needs for post hospital transition for care coordination. Review of patient's electronic medical record reveals patient was admitted with Acute on Chronic systolic Heart Failure. [facility] liaison met with pt at bedside and introduced [facility] services and benefits. Pt receptive to a post hospital follow up with interest in managing his HF.  Patient was given an appointment reminder card and 24 hour Nurse Advice Line magnet.   Plan: HIPAA verified and [facility] Liaison will continue to follow progress and disposition to asess for post hospital community care coordination/management needs.  Referral request for community care coordination: pending disposition.   [facility] Care Management/Population Health does not replace or interfere with any arrangements made by the Inpatient Transition of Care team.   For questions contact:   Elliot Cousin, RN, BSN [facility] Liaison [place]   [facility]  Population Health Office Hours MTWF 8:00 am to 6 pm off on Thursday [phone] mobile [phone] [Office toll free line][facility] Office Hours are M-F 8:30 - 5 pm 24 hour nurse advise line [phone] Conceirge  [email or web address]

## 2022-07-09 NOTE — Plan of Care (Signed)

## 2022-07-09 NOTE — Progress Notes (Addendum)
Advanced Heart Failure Rounding Note  PCP-Cardiologist: [name], MD   Subjective:     Lab Results  Component Value Date   CREATININE 4.12 (H) 07/09/2022   CREATININE 4.05 (H) 07/08/2022   CREATININE 3.61 (H) 07/07/2022    Denies SOB.    Objective:   Weight Range: 83.1 kg Body mass index is 27.84 kg/m.   Vital Signs:   Temp:  [98.1 F (36.7 C)-98.3 F (36.8 C)] 98.1 F (36.7 C) (05/15 0443) Pulse Rate:  [84-85] 85 (05/15 0443) Resp:  [16-18] 16 (05/15 0443) BP: (131-140)/(74-88) 140/74 (05/15 0443) SpO2:  [95 %-98 %] 95 % (05/15 0443) Weight:  [83.1 kg] 83.1 kg (05/15 0443) Last BM Date : 07/05/22  Weight change: Filed Weights   07/07/22 0422 07/08/22 0438 07/09/22 0443  Weight: 84 kg 82.7 kg 83.1 kg    Intake/Output:   Intake/Output Summary (Last 24 hours) at 07/09/2022 0919 Last data filed at 07/09/2022 0300 Gross per 24 hour  Intake 240 ml  Output 250 ml  Net -10 ml       Physical Exam   General:   No resp difficulty HEENT: normal Neck: supple. no JVD. Carotids 2+ bilat; no bruits. No lymphadenopathy or thryomegaly appreciated. Cor: PMI nondisplaced. Regular rate & rhythm. No rubs, gallops or murmurs. Lungs: clear Abdomen: soft, nontender, nondistended. No hepatosplenomegaly. No bruits or masses. Good bowel sounds. Extremities: no cyanosis, clubbing, rash, edema Neuro: alert & orientedx3, cranial nerves grossly intact. moves all 4 extremities w/o difficulty. Affect pleasant  Telemetry  SR 80s   Labs    CBC No results for input(s): "WBC", "NEUTROABS", "HGB", "HCT", "MCV", "PLT" in the last 72 hours. Basic Metabolic Panel Recent Labs    16/10/96 0235 07/09/22 0249  NA 139 139  K 3.3* 3.7  CL 100 99  CO2 26 26  GLUCOSE 133* 87  BUN 62* 75*  CREATININE 4.05* 4.12*  CALCIUM 8.8* 8.8*   Liver Function Tests No results for input(s): "AST", "ALT", "ALKPHOS", "BILITOT", "PROT", "ALBUMIN" in the last 72 hours.  No results for  input(s): "LIPASE", "AMYLASE" in the last 72 hours. Cardiac Enzymes No results for input(s): "CKTOTAL", "CKMB", "CKMBINDEX", "TROPONINI" in the last 72 hours.  BNP: BNP (last 3 results) Recent Labs    05/06/22 0358  BNP 1,699.1*    ProBNP (last 3 results) No results for input(s): "PROBNP" in the last 8760 hours.   D-Dimer No results for input(s): "DDIMER" in the last 72 hours. Hemoglobin A1C No results for input(s): "HGBA1C" in the last 72 hours. Fasting Lipid Panel No results for input(s): "CHOL", "HDL", "LDLCALC", "TRIG", "CHOLHDL", "LDLDIRECT" in the last 72 hours. Thyroid Function Tests No results for input(s): "TSH", "T4TOTAL", "T3FREE", "THYROIDAB" in the last 72 hours.  Invalid input(s): "FREET3"  Other results:   Imaging    No results found.   Medications:     Scheduled Medications:  apixaban  2.5 mg Oral BID   aspirin EC  81 mg Oral Daily   atorvastatin  80 mg Oral Daily   calcitRIOL  0.25 mcg Oral Q M,W,F   colchicine  0.3 mg Oral Daily   isosorbide-hydrALAZINE  2 tablet Oral TID   melatonin  3 mg Oral QHS   metoprolol succinate  100 mg Oral Daily   OLANZapine  15 mg Oral QHS   OLANZapine  5 mg Oral BH-q7a   pantoprazole  40 mg Oral Daily   risperiDONE  1 mg Oral QHS   sertraline  100 mg Oral Daily   sodium bicarbonate  650 mg Oral BID   sodium chloride flush  3 mL Intravenous Q12H   sodium chloride flush  3 mL Intravenous Q12H    Infusions:  sodium chloride      PRN Medications: sodium chloride, acetaminophen, diazepam, ondansetron (ZOFRAN) IV, mouth rinse, oxyCODONE, sodium chloride flush    Patient Profile   67 y.o  male with hx CAD, chronic systolic CHF/iCM, severe MR, CKD IV, schizophrenia. Presented for RHC and found to be significantly volume overloaded >> admitted for IV diuresis and evaluation for possible mTEER.   Assessment/Plan   Acute on chronic systolic CHFiCM: - Prior inferior MI in 2013. EF has been reduced since  then - Echo 07/21: EF 30-35% - Echo 03/24: EF 35-40%, RV mildly reduced, severe LAE, severe MR. - TEE 04/24: EF 35-40%, RV mildly reduced, severe ischemic MR - R/LHC: Subtotal occlusion m LCX and m RCA, RCA collateralized by LAD, RA mean 20, PA mean 50, PCWP 30 with V waves to 40, LVEDP 29, CO/CI 6.57/3.21.  - NYHA IIIb. Diuretics stopped 5/13. Continue to hold diuretics.  - Diuresed 17 lb. Continue metoprolol xl 100 mg daily - Continue bidil  2 tabs TID - Hold off on med titration with AKI.    2. Severe MR: -Has been seen by structural team -Considering mTEER once HF management optimized   3. CAD: -Hx inferior MI in 2013 -LHC 07/03/22: Subtotal occlusion m LCX and m RCA, RCA collateralized by LAD, LAD okay - No chest pain.  -On rosuvastatin 20 mg daily at home. Switched to atorvastatin 80 given renal dysfunction.    4. AKI on CKD IV: -Baseline seems to be 2.8-3.2 -Scr 2.9>>3.61>>4 >4.12 . Rate of rise slowing.  -In setting of diuresis + ? CIN - Holding diuretics. Need to document urine output. No I/Os for last 24 hours.  -Sees [facility]   5. Iron deficiency anemia: -Hgb stable 10.5, improved from 7s in March. Has been treated with feraheme   6. Schizophrenia   7. Hypokalemia: -K 3.7 -Supp  8. Hx DVT: -Anticoagulated with Eliquis 2.5 BID  Needs to ambulate.  Length of Stay: 5  Amy Clegg, NP  07/09/2022, 9:19 AM  Advanced Heart Failure Team Pager [phone] (M-F; 7a - 5p)  Please contact [facility] for night-coverage after hours (5p -7a ) and weekends on [email or web address]  Patient seen with NP, agree with the above note.   Creatinine 4.05 => 4.12.  No complaints.  Weight stable.    General: NAD Neck: No JVD, no thyromegaly or thyroid nodule.  Lungs: Clear to auscultation bilaterally with normal respiratory effort. CV: Nondisplaced PMI.  Heart regular S1/S2, no S3/S4, 1/6 HSM apex.  No peripheral edema.   Abdomen: Soft, nontender, no hepatosplenomegaly, no  distention.  Skin: Intact without lesions or rashes.  Neurologic: Alert and oriented x 3.  Psych: Normal affect. Extremities: No clubbing or cyanosis.  HEENT: Normal.   Volume status much better but has developed AKI on CKD stage 3, creatinine up to 4.12.  TEE with EF 35-40%, severe infarct-related MR.  - No diuretics today.   - Continue Bidil 2 tabs tid.  - Continue Toprol XL 100 mg daily.  - Follow creatinine, will need to restart po diuretic regimen eventually when it stabilizes.    He will need Mitraclip, will plan to schedule as outpatient once he is stabilized this admission and goes home.    Subtotal LCx and RCA occlusions,  medical management for now.    AKI in setting of diuresis and contrast load with cath on Friday.  Baseline creatinine around 2.3, up to 4.12 now but rate of rise is slowing.  BP stable, continue to hold diuretics.    Can go home once creatinine stabilizes and we get him on home diuretic regimen.   Marca Ancona 07/09/2022 11:27 AM

## 2022-07-10 ENCOUNTER — Other Ambulatory Visit (HOSPITAL_COMMUNITY): Payer: Self-pay

## 2022-07-10 ENCOUNTER — Other Ambulatory Visit: Payer: Self-pay

## 2022-07-10 DIAGNOSIS — I340 Nonrheumatic mitral (valve) insufficiency: Secondary | ICD-10-CM

## 2022-07-10 DIAGNOSIS — I4891 Unspecified atrial fibrillation: Secondary | ICD-10-CM

## 2022-07-10 DIAGNOSIS — I5023 Acute on chronic systolic (congestive) heart failure: Secondary | ICD-10-CM | POA: Diagnosis not present

## 2022-07-10 LAB — BASIC METABOLIC PANEL
Anion gap: 15 (ref 5–15)
BUN: 77 mg/dL — ABNORMAL HIGH (ref 8–23)
CO2: 24 mmol/L (ref 22–32)
Calcium: 8.9 mg/dL (ref 8.9–10.3)
Chloride: 101 mmol/L (ref 98–111)
Creatinine, Ser: 3.86 mg/dL — ABNORMAL HIGH (ref 0.61–1.24)
GFR, Estimated: 17 mL/min — ABNORMAL LOW (ref >60)
Glucose, Bld: 93 mg/dL (ref 70–99)
Potassium: 3.4 mmol/L — ABNORMAL LOW (ref 3.5–5.1)
Sodium: 140 mmol/L (ref 135–145)

## 2022-07-10 MED ORDER — COLCHICINE 0.6 MG PO TABS
0.3000 mg | ORAL_TABLET | Freq: Every day | ORAL | 3 refills | Status: AC
  Filled 2022-07-10: qty 30, 60d supply, fill #0

## 2022-07-10 MED ORDER — ATORVASTATIN CALCIUM 80 MG PO TABS
80.0000 mg | ORAL_TABLET | Freq: Every day | ORAL | 3 refills | Status: AC
  Filled 2022-07-10: qty 30, 30d supply, fill #0

## 2022-07-10 MED ORDER — FUROSEMIDE 40 MG PO TABS
40.0000 mg | ORAL_TABLET | Freq: Two times a day (BID) | ORAL | 3 refills | Status: DC
  Filled 2022-07-10: qty 60, 30d supply, fill #0

## 2022-07-10 MED ORDER — HYDRALAZINE HCL 50 MG PO TABS
75.0000 mg | ORAL_TABLET | Freq: Three times a day (TID) | ORAL | 6 refills | Status: DC
  Filled 2022-07-10: qty 135, 30d supply, fill #0

## 2022-07-10 MED ORDER — FUROSEMIDE 40 MG PO TABS: 40.0000 mg | ORAL_TABLET | Freq: Two times a day (BID) | ORAL | 3 refills | Status: DC

## 2022-07-10 MED ORDER — ISOSORB DINITRATE-HYDRALAZINE 20-37.5 MG PO TABS
2.0000 | ORAL_TABLET | Freq: Three times a day (TID) | ORAL | 6 refills | Status: DC
  Filled 2022-07-10: qty 180, 30d supply, fill #0

## 2022-07-10 MED ORDER — METOPROLOL SUCCINATE ER 100 MG PO TB24
100.0000 mg | ORAL_TABLET | Freq: Every day | ORAL | 6 refills | Status: DC
  Filled 2022-07-10: qty 30, 30d supply, fill #0

## 2022-07-10 NOTE — Discharge Summary (Addendum)
Advanced Heart Failure Team  Discharge Summary   Patient ID: Glenn Hawkins MRN: [id number], DOB/AGE: May 15, 1955 67 y.o. Admit date: 07/03/2022 D/C date:     07/10/2022   Primary Discharge Diagnoses:  Acute on chronic systolic CHFiCM: 2. Severe MR: 3. CAD: 4. AKI on CKD IV-->CIN 5. Iron deficiency anemia 6. Schizophrenia  7. Hypokalemia  8. Hx DVT:  Hospital Course: 67 y.o male with hx CAD, chronic systolic CHF/iCM, severe MR, CKD IV, schizophrenia. Presented for RHC and found to be significantly volume overloaded >> admitted for IV diuresis and evaluation for possible mTEER. Heart Failure Team consulted to help optimize.   Diuresed with IV lasix and underwent Cath. Subtotal occlusion m LCX and m RCA, RCA collateralized by LAD, LAD okay. Post cath developed AKI, possibly from CIN. Creatinine peaked at 4.2 and was back down to 3. 86 on the day of discharge.   GDMT limited due to AKI. Discharging on hydralazine, imdur, and toprol xl. Start lasix 40 mg twice a day tomorrow.   Structural Team will follow and see next week. HF follow will be set up.   See below for detailed problems list. He will contine to be followed closely in the HF clinic.    1. Acute on chronic systolic CHFiCM: - Prior inferior MI in 2013. EF has been reduced since then - Echo 07/21: EF 30-35% - Echo 03/24: EF 35-40%, RV mildly reduced, severe LAE, severe MR. - TEE 04/24: EF 35-40%, RV mildly reduced, severe ischemic MR - R/LHC: Subtotal occlusion m LCX and m RCA, RCA collateralized by LAD, RA mean 20, PA mean 50, PCWP 30 with V waves to 40, LVEDP 29, CO/CI 6.57/3.21.  - NYHA IIIb. Diuretics stopped 5/13 due to AKI.   - Diuresed 15 lb. Volume status stable. Tomorrow will restart lasix 40 mg twice a day.  -Continue metoprolol xl 100 mg daily - He can't afford Bidil. Switched to hydralazine 75 mg three and will continue imdur.  - No MRA/ARNi with AKI.    2. Severe MR: -Has been seen by structural team  and will  follow up next week.  -Plan mTEER next week.    3. CAD: -Hx inferior MI in 2013 -LHC 07/03/22: Subtotal occlusion m LCX and m RCA, RCA collateralized by LAD, LAD okay - No chest pain. -On rosuvastatin 20 mg daily at home. Switched to atorvastatin 80 given renal dysfunction.   4. AKI on CKD IV: -Baseline seems to be 2.8-3.2 -Scr peaked at 4.1-->3.86  -In setting of diuresis + ? CIN-  5. Iron deficiency anemia: -Hgb stable 10.5, improved from 7s in March. Has been treated with feraheme  6. Schizophrenia Continue on home medications.   7. Hypokalemia: Supp  8. Hx DVT: -Anticoagulated with Eliquis 2.5 BID  Discharge Vitals: Blood pressure 136/88, pulse 81, temperature 98.2 F (36.8 C), temperature source Oral, resp. rate 20, height 5\' 8"  (1.727 m), weight 83.9 kg, SpO2 96 %.  Labs: Lab Results  Component Value Date   WBC 4.4 06/27/2022   HGB 11.2 (L) 07/03/2022   HCT 33.0 (L) 07/03/2022   MCV 94 06/27/2022   PLT 114 (L) 06/27/2022    Recent Labs  Lab 07/10/22 0240  NA 140  K 3.4*  CL 101  CO2 24  BUN 77*  CREATININE 3.86*  CALCIUM 8.9  GLUCOSE 93   Lab Results  Component Value Date   CHOL 109 07/04/2022   HDL 42 07/04/2022   LDLCALC 55 07/04/2022  TRIG 62 07/04/2022   BNP (last 3 results) Recent Labs    05/06/22 0358  BNP 1,699.1*    ProBNP (last 3 results) No results for input(s): "PROBNP" in the last 8760 hours.   Diagnostic Studies/Procedures  R/L  07/03/22  1.  Patent left main stem and LAD without significant stenosis 2.  Subtotal occlusion of the mid circumflex and the mid RCA, RCA collateralized from a septal perforating cascade of the LAD 3.  Severely elevated LVEDP of 29 mmHg 4.  Elevated right heart pressures: RA mean 20 mmHg PA 74/39 mean 50 mmHg Wedge pressure mean 30, V wave 40 mmHg Cardiac output 6.57 L/min, cardiac index 3.21 Transpulmonic gradient 20 mmHg, PVR 3 Wood units Discharge Medications   Allergies as of 07/10/2022        Reactions   Nifedipine Diarrhea   Aciphex [rabeprazole Sodium] Diarrhea   Benazepril Other (See Comments)   Unknown   Haloperidol Anxiety   Causes severe anxiety   Prilosec [omeprazole] Diarrhea        Medication List     STOP taking these medications    metoprolol tartrate 100 MG tablet Commonly known as: LOPRESSOR   rosuvastatin 40 MG tablet Commonly known as: CRESTOR       TAKE these medications    aspirin EC 81 MG tablet Take 81 mg by mouth daily. Swallow whole.   atorvastatin 80 MG tablet Commonly known as: LIPITOR Take 1 tablet (80 mg total) by mouth daily. Start taking on: Jul 11, 2022   brimonidine 0.2 % ophthalmic solution Commonly known as: ALPHAGAN Place 1 drop into both eyes daily.   calcitRIOL 0.25 MCG capsule Commonly known as: ROCALTROL Take 0.25 mcg by mouth every Monday, Wednesday, and Friday.   colchicine 0.6 MG tablet Take 0.5 tablets (0.3 mg total) by mouth daily. Start taking on: Jul 11, 2022 What changed:  how much to take when to take this   Daridorexant HCl 25 MG Tabs Take 25 mg by mouth at bedtime.   DOCUSATE SODIUM PO Take 1 capsule by mouth daily.   Eliquis 5 MG Tabs tablet Generic drug: apixaban Take 2.5 mg by mouth 2 (two) times daily.   esomeprazole 20 MG capsule Commonly known as: NexIUM Take 1 capsule (20 mg total) by mouth daily before breakfast.   furosemide 40 MG tablet Commonly known as: LASIX Take 1 tablet (40 mg total) by mouth 2 (two) times daily. Start taking on: Jul 11, 2022 What changed: when to take this   hydrALAZINE 50 MG tablet Commonly known as: APRESOLINE Take 1.5 tablets (75 mg total) by mouth 3 (three) times daily.   isosorbide mononitrate 30 MG 24 hr tablet Commonly known as: IMDUR Take 1 tablet (30 mg total) by mouth every morning. Patient needs appointment for further refills. 1 st attempt   latanoprost 0.005 % ophthalmic solution Commonly known as: XALATAN Place 1 drop into both eyes  at bedtime.   melatonin 3 MG Tabs tablet Take 3 mg by mouth at bedtime.   metoprolol succinate 100 MG 24 hr tablet Commonly known as: TOPROL-XL Take 1 tablet (100 mg total) by mouth daily. Take with or immediately following a meal.   nitroGLYCERIN 0.4 MG SL tablet Commonly known as: Nitrostat Dissolve 1 tablet under the tongue every 5 minutes as needed for chest pain. Max of 3 doses, then 911.   OLANZapine 10 MG tablet Commonly known as: ZYPREXA Take 5-15 mg by mouth See admin instructions. Take 5 mg  in the morning and 15 mg at night   risperiDONE 1 MG tablet Commonly known as: RISPERDAL Take 1 tablet (1 mg total) by mouth at bedtime.   sertraline 100 MG tablet Commonly known as: ZOLOFT Take 100 mg by mouth daily.   sodium bicarbonate 650 MG tablet Take 650 mg by mouth 2 (two) times daily.        Disposition   The patient will be discharged in stable condition to home. Discharge Instructions     (HEART FAILURE PATIENTS) Call MD:  Anytime you have any of the following symptoms: 1) 3 pound weight gain in 24 hours or 5 pounds in 1 week 2) shortness of breath, with or without a dry hacking cough 3) swelling in the hands, feet or stomach 4) if you have to sleep on extra pillows at night in order to breathe.   Complete by: As directed    Diet - low sodium heart healthy   Complete by: As directed    Increase activity slowly   Complete by: As directed        Follow-up Information     Vyas, Dhruv B, MD Follow up.   Specialty: Internal Medicine Contact information: [address] [place] [address] [phone]         [facility] Follow up on 07/17/2022.   Specialty: Cardiology Why: at 1000 Contact information: [address] [id number] mc [address] [place] [address] [phone]        Filbert Schilder, NP Follow up on 07/14/2022.   Specialty: Cardiology Why: @ 8:40am. Please arrive at 8:20am Contact  information: [address] [place] [address] [phone]                   Duration of Discharge Encounter: Greater than 35 minutes   Signed, Anes Rigel NP-C  07/10/2022, 11:27 AM

## 2022-07-10 NOTE — Care Management Important Message (Signed)
Important Message  Patient Details  Name: Glenn Hawkins MRN: [id number] Date of Birth: 1955/08/11   Medicare Important Message Given:  Yes     Renie Ora 07/10/2022, 12:27 PM

## 2022-07-10 NOTE — Progress Notes (Addendum)
Advanced Heart Failure Rounding Note  PCP-Cardiologist: [name], MD   Subjective:     Lab Results  Component Value Date   CREATININE 3.86 (H) 07/10/2022   CREATININE 4.12 (H) 07/09/2022   CREATININE 4.05 (H) 07/08/2022    Denies SOB.    Objective:   Weight Range: 83.9 kg Body mass index is 28.13 kg/m.   Vital Signs:   Temp:  [98.2 F (36.8 C)] 98.2 F (36.8 C) (05/16 0903) Pulse Rate:  [81-95] 81 (05/16 0903) Resp:  [16-20] 20 (05/16 0903) BP: (129-149)/(71-88) 136/88 (05/16 0903) SpO2:  [96 %] 96 % (05/16 0903) Weight:  [83.9 kg] 83.9 kg (05/16 0451) Last BM Date : 07/05/22  Weight change: Filed Weights   07/08/22 0438 07/09/22 0443 07/10/22 0451  Weight: 82.7 kg 83.1 kg 83.9 kg    Intake/Output:   Intake/Output Summary (Last 24 hours) at 07/10/2022 0939 Last data filed at 07/09/2022 2230 Gross per 24 hour  Intake --  Output 550 ml  Net -550 ml       Physical Exam   General:   No resp difficulty HEENT: normal Neck: supple. no JVD. Carotids 2+ bilat; no bruits. No lymphadenopathy or thryomegaly appreciated. Cor: PMI nondisplaced. Regular rate & rhythm. No rubs, gallops or murmurs. Lungs: clear Abdomen: soft, nontender, nondistended. No hepatosplenomegaly. No bruits or masses. Good bowel sounds. Extremities: no cyanosis, clubbing, rash, edema.  Neuro: alert & orientedx3, cranial nerves grossly intact. moves all 4 extremities w/o difficulty. Affect pleasant  Telemetry  SR 80s   Labs    CBC No results for input(s): "WBC", "NEUTROABS", "HGB", "HCT", "MCV", "PLT" in the last 72 hours. Basic Metabolic Panel Recent Labs    16/10/96 0249 07/10/22 0240  NA 139 140  K 3.7 3.4*  CL 99 101  CO2 26 24  GLUCOSE 87 93  BUN 75* 77*  CREATININE 4.12* 3.86*  CALCIUM 8.8* 8.9   Liver Function Tests No results for input(s): "AST", "ALT", "ALKPHOS", "BILITOT", "PROT", "ALBUMIN" in the last 72 hours.  No results for input(s): "LIPASE",  "AMYLASE" in the last 72 hours. Cardiac Enzymes No results for input(s): "CKTOTAL", "CKMB", "CKMBINDEX", "TROPONINI" in the last 72 hours.  BNP: BNP (last 3 results) Recent Labs    05/06/22 0358  BNP 1,699.1*    ProBNP (last 3 results) No results for input(s): "PROBNP" in the last 8760 hours.   D-Dimer No results for input(s): "DDIMER" in the last 72 hours. Hemoglobin A1C No results for input(s): "HGBA1C" in the last 72 hours. Fasting Lipid Panel No results for input(s): "CHOL", "HDL", "LDLCALC", "TRIG", "CHOLHDL", "LDLDIRECT" in the last 72 hours. Thyroid Function Tests No results for input(s): "TSH", "T4TOTAL", "T3FREE", "THYROIDAB" in the last 72 hours.  Invalid input(s): "FREET3"  Other results:   Imaging    No results found.   Medications:     Scheduled Medications:  apixaban  2.5 mg Oral BID   aspirin EC  81 mg Oral Daily   atorvastatin  80 mg Oral Daily   calcitRIOL  0.25 mcg Oral Q M,W,F   colchicine  0.3 mg Oral Daily   isosorbide-hydrALAZINE  2 tablet Oral TID   melatonin  3 mg Oral QHS   metoprolol succinate  100 mg Oral Daily   OLANZapine  15 mg Oral QHS   OLANZapine  5 mg Oral BH-q7a   pantoprazole  40 mg Oral Daily   risperiDONE  1 mg Oral QHS   sertraline  100 mg Oral  Daily   sodium bicarbonate  650 mg Oral BID   sodium chloride flush  3 mL Intravenous Q12H   sodium chloride flush  3 mL Intravenous Q12H    Infusions:  sodium chloride      PRN Medications: sodium chloride, acetaminophen, diazepam, ondansetron (ZOFRAN) IV, mouth rinse, oxyCODONE, sodium chloride flush    Patient Profile   67 y.o  male with hx CAD, chronic systolic CHF/iCM, severe MR, CKD IV, schizophrenia. Presented for RHC and found to be significantly volume overloaded >> admitted for IV diuresis and evaluation for possible mTEER.   Assessment/Plan   Acute on chronic systolic CHFiCM: - Prior inferior MI in 2013. EF has been reduced since then - Echo 07/21: EF  30-35% - Echo 03/24: EF 35-40%, RV mildly reduced, severe LAE, severe MR. - TEE 04/24: EF 35-40%, RV mildly reduced, severe ischemic MR - R/LHC: Subtotal occlusion m LCX and m RCA, RCA collateralized by LAD, RA mean 20, PA mean 50, PCWP 30 with V waves to 40, LVEDP 29, CO/CI 6.57/3.21.  - NYHA IIIb. Diuretics stopped 5/13.   - Diuresed 15 lb. Volume status stable. Tomorrow will restart lasix 40 mg twice a day.  -Continue metoprolol xl 100 mg daily - Cost for bidil over $ 200.00. Switch to hydralazine 75 mg three times a day and put back on home imdur.  - Hold off on med titration with AKI.  Creatinine trending down.    2. Severe MR: -Has been seen by structural team -Considering mTEER once HF management optimized   3. CAD: -Hx inferior MI in 2013 -LHC 07/03/22: Subtotal occlusion m LCX and m RCA, RCA collateralized by LAD, LAD okay - No chest pain. -On rosuvastatin 20 mg daily at home. Switched to atorvastatin 80 given renal dysfunction.    4. AKI on CKD IV: -Baseline seems to be 2.8-3.2 -Scr 2.9>>3.61>>4 >4.12>3.86  -In setting of diuresis + ? CIN - Holding diuretics. Need to document urine output. No I/Os for last 24 hours.  -Sees [facility]   5. Iron deficiency anemia: -Hgb stable 10.5, improved from 7s in March. Has been treated with feraheme   6. Schizophrenia   7. Hypokalemia: -K 3.4 -Supp  8. Hx DVT: -Anticoagulated with Eliquis 2.5 BID  Home today.   Length of Stay: 6  Amy Clegg, NP  07/10/2022, 9:39 AM  Advanced Heart Failure Team Pager [phone] (M-F; 7a - 5p)  Please contact [facility] for night-coverage after hours (5p -7a ) and weekends on [email or web address]  Agree with the above NP note.   Creatinine trending down to 3.86.  Can go home today.  Will start Lasix 40 mg bid tomorrow.  Will need close followup, mTEER soon.   Marca Ancona 07/10/2022

## 2022-07-10 NOTE — H&P (View-Only) (Signed)
[facility]   MULTIDISCIPLINARY HEART VALVE CLINIC                                     Cardiology Office Note:    Date:  07/15/2022   ID:  Glenn Hawkins, DOB 01/22/1956, MRN [id number]  PCP:  Vyas, Dhruv B, MD  [facility] Cardiologist:  Muhammad Arida, MD  [facility] Electrophysiologist:  None   Referring MD: Vyas, Dhruv B, MD   Chief Complaint  Patient presents with   Follow-up    Pre TEER    History of Present Illness:    Glenn Hawkins is a 67 y.o. male with a hx of CAD, ischemic cardiomyopathy, chronic systolic heart failure, MI 2013 (not even taken to CL due to kidney fx and medical therapy was recommended), extensive DVT 2016 now on Eliquis, PAD, schizophrenia, CKD stage IV, and severe mitral regurgitation who is being seen today prior to scheduled TEER.   Glenn Hawkins was initially seen by cardiology after presenting late with an inferior MI in 2013. He was not taken to the cath lab due to poor renal function and concern for contrast induced nephropathy and has been treated medically since that time. He had an extensive DVT in 2016 and has been anticoagulated with low dose Eliquis. More recently he has had several hospitalizations for acute heart failure symptoms complicated by respiratory failure. On echocardiogram 04/2022, he was found to have severe mitral regurgitation felt to be ischemic in etiology. His heart failure treatment has been complicated by Stage IV CKD. He ultimately underwent TEE confirming severe ischemic MR secondary to posterior leaflet restriction with moderately severe segmental LV systolic dysfunction and LVEF of 35%. He was seen by Dr. Cooper 06/05/22 with plans to pursue R/LHC to evaluate for CAD.   He then presented for OP cardiac cath which showed subtotal occlusion of the mLCx and the mRCA with collaterals and severely elevated LVEDP at 29mmHg. He was admitted post cath for IV diuresis and AHF consultation. He responded well to diuresis  with a discharge weight at 184lb.  His hospital course was complicated by AKI with a peak Cr at 4.1. IV diuretics were held 5/13 and he resumed 5/17. Cr on DOD at 3.86. He was continued on metoprolol and Bidil was transitioned to hydralazine and Imdur due to cost. Due to hx of DVT he was continued on low dose Eliquis.   Today he is here with his sister and reports that he has been well since discharge. His weight today is up to 190lb however this was in the office with clothes. He reports compliance with diuretics although is not weighing at home on a regular basis. He denies LE or abdominal edema. No SOB. He continues to have significant fatigue. Denies palpitations, bleeding in stool or urine. No dizziness, or syncope.   Past Medical History:  Diagnosis Date   Aortic insufficiency    a. 08/2019 Echo: mild to mod AI.   C. 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 02/2011 Late presentation MI-->Myoview w/ large area of transmural infarct in LCX distribution, no ischemia.   Depressive disorder, not elsewhere classified    Esophageal reflux      Gout, unspecified    Hemorrhoids    a. 01/2016 s/p hemorrhoidectomy.   HFrEF (heart failure with reduced ejection fraction) (HCC)    a. 04/2012 Echo: EF 40%, inflat AK, Gr1 DD, mild AI/MR, triv TR; b. 11/2017 EchoP EF 30-35%, inflat HK; c. 08/2019 Echo: EF 30-35%, gr1 DD, inflat AK.   History of DVT (deep vein thrombosis)    a. Chronic Eliquis.   Hyperlipidemia LDL goal <70    Hypertension    Hypotension, unspecified    Ischemic cardiomyopathy    a. 04/2012 Echo: EF 40%, inflat AK, Gr1 DD, mild AI/MR, triv TR; b. 11/2017 EchoP EF 30-35%, inflat HK; c. 08/2019 Echo: EF 30-35%, gr1 DD, inflat AK. Nl RV size/fxn. Mild MR. Mild to mod AI.   Mitral valve  disorders(424.0)    a. 04/2012 Echo: EF 40%, mild MR.   Myocardial infarction (lateral wall) (HCC) 2013   a. 02/2011 - late presentation. Managed conservatively 2/2 CKD;  b. 02/2011 Myoview: Large transmural infarct in the LCX distribution, no ischemia.   PAD (peripheral artery disease) (HCC)    a. 08/2015 ABI/duplex: R: 0.86, L 0.96. Duplex w/ bilateral heterogeneous plaque in mid fem arteries, no significant stenoses; b. 08/2019 ABI/Duplex: prob bilat inflow dzs w/ stable ABIs (R 0.85, L 0.93).   Personal history of tobacco use, presenting hazards to health    Torn ligament    Unspecified schizophrenia, unspecified condition     Past Surgical History:  Procedure Laterality Date   BIOPSY  12/17/2020   Procedure: BIOPSY;  Surgeon: Carver, Charles K, DO;  [facility];  Service: Endoscopy;;   COLONOSCOPY     COLONOSCOPY N/A 11/10/2014   Procedure: COLONOSCOPY;  Surgeon: Steven Paul Armbruster, MD;  Location: [facility];  Service: Gastroenterology;  Laterality: N/A;   COLONOSCOPY WITH PROPOFOL N/A 05/08/2015   Surgeon: Sandi L Fields, MD; nonthrombosed external hemorrhoids, one 8 mm tubular adenoma, one 4 mm tubular adenoma.  Repeat colonoscopy in 5-10 years.   COLONOSCOPY WITH PROPOFOL N/A 12/17/2020   Procedure: COLONOSCOPY WITH PROPOFOL;  Surgeon: Carver, Charles K, DO;  Location: [facility];  Service: Endoscopy;  Laterality: N/A;  8:30am   ESOPHAGOGASTRODUODENOSCOPY (EGD) WITH PROPOFOL N/A 05/08/2015   Surgeon: Sandi L Fields, MD; LA grade a reflux esophagitis, normal stomach and duodenum.   ESOPHAGOGASTRODUODENOSCOPY (EGD) WITH PROPOFOL N/A 12/17/2020   Procedure: ESOPHAGOGASTRODUODENOSCOPY (EGD) WITH PROPOFOL;  Surgeon: Carver, Charles K, DO;  [facility]: [facility];  Service: Endoscopy;  Laterality: N/A;   EYE SURGERY Right    removal of foreign body   HEMORRHOID SURGERY N/A 02/20/2016   Procedure: EXTENSIVE HEMORRHOIDECTOMY;  Surgeon: Mark Jenkins, MD;  Location: [facility];  Service: General;  Laterality: N/A;   None     POLYPECTOMY  05/08/2015   Procedure: POLYPECTOMY;  Surgeon: Sandi L Fields, MD;  Location: [facility];  Service: Endoscopy;;  transverse colon polyp   RIGHT/LEFT HEART CATH AND CORONARY ANGIOGRAPHY N/A 07/03/2022   Procedure: RIGHT/LEFT HEART CATH AND CORONARY ANGIOGRAPHY;  Surgeon: Cooper, Michael, MD;  Location: [facility];  Service: Cardiovascular;  Laterality: N/A;   TEE WITHOUT CARDIOVERSION N/A 05/26/2022   Procedure: TRANSESOPHAGEAL ECHOCARDIOGRAM (TEE);  Surgeon: Nishan, Peter C, MD;  Location: [facility];  Service: Cardiovascular;  Laterality: N/A;    Current Medications: Current Meds  Medication Sig   apixaban (ELIQUIS) 5 MG TABS tablet Take 2.5 mg by mouth 2 (two) times daily.   aspirin EC 81 MG tablet Take   81 mg by mouth daily. Swallow whole.   atorvastatin (LIPITOR) 80 MG tablet Take 1 tablet (80 mg total) by mouth daily.   brimonidine (ALPHAGAN) 0.2 % ophthalmic solution Place 1 drop into both eyes daily.   calcitRIOL (ROCALTROL) 0.25 MCG capsule Take 0.25 mcg by mouth every Monday, Wednesday, and Friday.   colchicine 0.6 MG tablet Take 0.5 tablets (0.3 mg total) by mouth daily.   Daridorexant HCl 25 MG TABS Take 25 mg by mouth at bedtime.   DOCUSATE SODIUM PO Take 1 capsule by mouth daily.   esomeprazole (NEXIUM) 20 MG capsule Take 1 capsule (20 mg total) by mouth daily before breakfast.   furosemide (LASIX) 40 MG tablet Take 1 tablet (40 mg total) by mouth 2 (two) times daily.   hydrALAZINE (APRESOLINE) 50 MG tablet Take 1.5 tablets (75 mg total) by mouth 3 (three) times daily.   isosorbide mononitrate (IMDUR) 30 MG 24 hr tablet Take 1 tablet (30 mg total) by mouth every morning. Patient needs appointment for further refills. 1 st attempt   latanoprost (XALATAN) 0.005 % ophthalmic solution Place 1 drop into both eyes at bedtime.   melatonin 3 MG TABS tablet Take 3 mg by mouth at bedtime.   metoprolol succinate  (TOPROL-XL) 100 MG 24 hr tablet Take 1 tablet (100 mg total) by mouth daily. Take with or immediately following a meal.   nitroGLYCERIN (NITROSTAT) 0.4 MG SL tablet Dissolve 1 tablet under the tongue every 5 minutes as needed for chest pain. Max of 3 doses, then 911.   OLANZapine (ZYPREXA) 10 MG tablet Take 5-15 mg by mouth See admin instructions. Take 5 mg in the morning and 15 mg at night   risperiDONE (RISPERDAL) 1 MG tablet Take 1 tablet (1 mg total) by mouth at bedtime.   sertraline (ZOLOFT) 100 MG tablet Take 100 mg by mouth daily.   sodium bicarbonate 650 MG tablet Take 650 mg by mouth 2 (two) times daily.     Allergies:   Nifedipine, Aciphex [rabeprazole sodium], Benazepril, Haloperidol, and Prilosec [omeprazole]   Social History   Socioeconomic History   Marital status: Divorced    Spouse name: Not on file   Number of children: Not on file   Years of education: Not on file   Highest education level: Not on file  Occupational History   Not on file  Tobacco Use   Smoking status: Former    Packs/day: 1.00    Years: 23.00    Additional pack years: 0.00    Total pack years: 23.00    Types: Cigarettes    Quit date: 02/25/1996    Years since quitting: 26.4   Smokeless tobacco: Never   Tobacco comments:    + 15 years of smoking  Vaping Use   Vaping Use: Never used  Substance and Sexual Activity   Alcohol use: No    Comment: Daily alcohol use 20+ years ago   Drug use: Not Currently    Types: Cocaine    Comment: History of intranasal cocaine and speed 20+ years ago.   Sexual activity: Not Currently    Birth control/protection: None  Other Topics Concern   Not on file  Social History Narrative   Not on file   Social Determinants of Health   Financial Resource Strain: Not on file  Food Insecurity: No Food Insecurity (07/09/2022)   Hunger Vital Sign    Worried About Running Out of Food in the Last Year: Never true      Ran Out of Food in the Last Year: Never true   Transportation Needs: No Transportation Needs (07/09/2022)   PRAPARE - Transportation    Lack of Transportation (Medical): No    Lack of Transportation (Non-Medical): No  Physical Activity: Not on file  Stress: Not on file  Social Connections: Not on file     Family History: The patient's family history includes Crohn's disease in his sister. There is no history of Colon cancer.  ROS:   Please see the history of present illness.    All other systems reviewed and are negative.  EKGs/Labs/Other Studies Reviewed:    The following studies were reviewed today:  R/LHC 07/03/22:  1.  Patent left main stem and LAD without significant stenosis 2.  Subtotal occlusion of the mid circumflex and the mid RCA, RCA collateralized from a septal perforating cascade of the LAD 3.  Severely elevated LVEDP of 29 mmHg 4.  Elevated right heart pressures: RA mean 20 mmHg PA 74/39 mean 50 mmHg Wedge pressure mean 30, V wave 40 mmHg Cardiac output 6.57 L/min, cardiac index 3.21 Transpulmonic gradient 20 mmHg, PVR 3 Wood units   Recommend: Hospital admission for IV diuresis, heart failure consultation, consider transcatheter edge-to-edge repair of the mitral valve following optimization of medical therapy. Medical therapy for CAD.    TEE 05/26/22:   1. Posterior lateral hypokinesis . Left ventricular ejection fraction, by  estimation, is 35 to 40%. The left ventricle has moderately decreased  function. The left ventricle demonstrates regional wall motion  abnormalities (see scoring diagram/findings  for description). The left ventricular internal cavity size was moderately  dilated.   2. Right ventricular systolic function is mildly reduced. The right  ventricular size is mildly enlarged.   3. Left atrial size was severely dilated. No left atrial/left atrial  appendage thrombus was detected.   4. Right atrial size was moderately dilated.   5. Severe ischemic MR two jets betwen A2/P2 suitable leaflet  length,  gradients and MVA for clip . The mitral valve is abnormal. Severe mitral  valve regurgitation. No evidence of mitral stenosis.   6. Tricuspid valve regurgitation is moderate.   7. The aortic valve is tricuspid. There is mild calcification of the  aortic valve. There is mild thickening of the aortic valve. Aortic valve  regurgitation is mild. Aortic valve sclerosis is present, with no evidence  of aortic valve stenosis.   Echocardiogram 05/07/22:   1. Left ventricular ejection fraction, by estimation, is 35 to 40%. The  left ventricle has moderately decreased function. The left ventricle  demonstrates regional wall motion abnormalities (see scoring  diagram/findings for description). The left  ventricular internal cavity size was moderately to severely dilated. Left  ventricular diastolic parameters are indeterminate.   2. Right ventricular systolic function is mildly reduced. The right  ventricular size is normal. There is mildly elevated pulmonary artery  systolic pressure. The estimated right ventricular systolic pressure is  41.2 mmHg.   3. Left atrial size was severely dilated.   4. A small pericardial effusion is present. The pericardial effusion is  localized near the right atrium.   5. 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 There  is splay artifact. The mitral valve is grossly normal. Severe mitral valve  regurgitation. No evidence of  mitral stenosis.   6. The aortic valve is tricuspid. There is mild calcification of the  aortic valve. Aortic valve   regurgitation is mild to moderate. No aortic  stenosis is present.   7. The inferior vena cava is dilated in size with >50% respiratory  variability, suggesting right atrial pressure of 8 mmHg.    EKG:  EKG is ordered today.  The ekg ordered today demonstrates NSR with HR 91bpm.   Recent Labs: 05/06/2022: B Natriuretic  Peptide 1,699.1; TSH 1.413 05/07/2022: ALT 19 07/05/2022: Magnesium 1.9 07/10/2022: BUN 77; Creatinine, Ser 3.86; Potassium 3.4; Sodium 140 07/14/2022: Hemoglobin 11.9; Platelets 114   Recent Lipid Panel    Component Value Date/Time   CHOL 109 07/04/2022 1007   CHOL 191 08/02/2019 1515   TRIG 62 07/04/2022 1007   HDL 42 07/04/2022 1007   HDL 37 (L) 08/02/2019 1515   CHOLHDL 2.6 07/04/2022 1007   VLDL 12 07/04/2022 1007   LDLCALC 55 07/04/2022 1007   LDLCALC 123 (H) 08/02/2019 1515   LDLDIRECT 125 (H) 08/02/2019 1515   Risk Assessment/Calculations:    STS Surgical Risk calculation:  Procedure Type: Isolated MVR  PERIOPERATIVE OUTCOME ESTIMATE %  Operative Mortality 9.91%  Morbidity & Mortality 39.7%  Stroke 3.25%  Renal Failure 38.9%  Reoperation 10.1%  Prolonged Ventilation 19.9%  Deep Sternal Wound Infection 0.076%  Long Hospital Stay (>14 days) 20.4%  Short Hospital Stay (<6 days)* 10.5%   Procedure Type: Isolated MVr  PERIOPERATIVE OUTCOME ESTIMATE %  Operative Mortality 5.07%  Morbidity & Mortality 27.1%  Stroke 2.08%  Renal Failure 30.6%  Reoperation 8.49%  Prolonged Ventilation 12%  Deep Sternal Wound Infection 0.05%  Long Hospital Stay (>14 days) 10.8%  Short Hospital Stay (<6 days)* 27.5%   Physical Exam:    VS:  BP (!) 148/84   Pulse 91   Ht 5' 8" (1.727 m)   Wt 190 lb 9.6 oz (86.5 kg)   SpO2 96%   BMI 28.98 kg/m     Wt Readings from Last 3 Encounters:  07/14/22 190 lb 9.6 oz (86.5 kg)  07/10/22 185 lb (83.9 kg)  07/02/22 200 lb (90.7 kg)    General: Well developed, well nourished, NAD Lungs:Diminished bilaterally with no wheezes, rales, or rhonchi. Breathing is unlabored. Cardiovascular: RRR with S1 S2. + murmur Abdomen: Soft, non-tender, non-distended. No obvious abdominal masses. Extremities: No edema.  Neuro: Alert and oriented. No focal deficits. No facial asymmetry. MAE spontaneously. Psych: Responds to questions appropriately with normal  affect.    ASSESSMENT/PLAN:    1. Pre-operative clearance   2. Chronic systolic heart failure (HCC)   3. CKD (chronic kidney disease) stage 4, GFR 15-29 ml/min (HCC)   4. Severe mitral insufficiency    PLAN:    Severe mitral regurgitation: Recent TEE confirming severe ischemic MR secondary to posterior leaflet restriction with moderately severe segmental LV systolic dysfunction and LVEF of 35%. Plan for TEER 5/22. Pre procedure instructions reviewed with all questions answered. CHG soap given. Obtain CBC, CMET, INR, BNP today. He will stop Eliquis today (took AM dose this morning).   Acute on chronic systolic ICM:  Discharge weight last week at 184 with a weight today at 190lb in the office. Reports compliance with diuretics. No SOB. Does not appear overtly lfuid volume overloaded today. Will continue current regimen given recent issues with AKI secondary to Lasix. Obtain CMET today.   Acute on chronic kidney disease Stage IV: Baseline Cr appears to be in the 2.8-3.5 range although noted to be up to 4.1 on recent hospitalization. Diuretics held x several days and restarted at discharge. Obtain CMET   today  CAD: Cath with subtotal occlusion of the mLCx and the mRCA with collaterals and severely elevated LVEDP at 29mmHg. Denies anginal symptoms. Continue ASA, statin, Toprol.   Schizophrenia: Appears stable. Continue Risperdal, Zyprexa.   Hx of DVT: On chronic low dose Eliquis however he will stop today in anticipation of TEER 5/22. We will restart post procedure.   HTN: Elevated today. Will follow post procedure.   HLD: Continue statin  Medication Adjustments/Labs and Tests Ordered: Current medicines are reviewed at length with the patient today.  Concerns regarding medicines are outlined above.  Orders Placed This Encounter  Procedures   Comprehensive metabolic panel   Pro b natriuretic peptide (BNP)   Protime-INR   CBC   EKG 12-Lead   No orders of the defined types were placed in  this encounter.   Patient Instructions  Medication Instructions:  Your physician recommends that you continue on your current medications as directed. Please refer to the Current Medication list given to you today.  *If you need a refill on your cardiac medications before your next appointment, please call your pharmacy*   Lab Work: TODAY: CMET, BNP,CBC, INR If you have labs (blood work) drawn today and your tests are completely normal, you will receive your results only by: MyChart Message (if you have MyChart) OR A paper copy in the mail If you have any lab test that is abnormal or we need to change your treatment, we will call you to review the results.   Testing/Procedures: SEE INSTRUCTION LETTER   Follow-Up: At [facility], you and your health needs are our priority.  As part of our continuing mission to provide you with exceptional heart care, we have created designated Provider Care Teams.  These Care Teams include your primary Cardiologist (physician) and Advanced Practice Providers (APPs -  Physician Assistants and Nurse Practitioners) who all work together to provide you with the care you need, when you need it.  We recommend signing up for the patient portal called "MyChart".  Sign up information is provided on this After Visit Summary.  MyChart is used to connect with patients for Virtual Visits (Telemedicine).  Patients are able to view lab/test results, encounter notes, upcoming appointments, etc.  Non-urgent messages can be sent to your provider as well.   To learn more about what you can do with MyChart, go to [email or web address].    Your next appointment:   POST PROCEDURAL FOLLOW-UP WILL BE SCHEDULED AT YOU HOSPITAL STAY.   Signed, Dandrae Kustra, NP  07/15/2022 8:03 AM    [facility] 

## 2022-07-10 NOTE — Progress Notes (Unsigned)
[facility]   MULTIDISCIPLINARY HEART VALVE CLINIC                                     Cardiology Office Note:    Date:  07/15/2022   ID:  Glenn Hawkins, DOB [birth year], MRN [id number]  PCP:  [name], MD  [facility] Cardiologist:  [name], MD  [facility] Electrophysiologist:  None   Referring MD: [name], MD   Chief Complaint  Patient presents with   Follow-up    Pre TEER    History of Present Illness:    Glenn Hawkins is a 67 y.o. male with a hx of CAD, ischemic cardiomyopathy, chronic systolic heart failure, MI 2013 (not even taken to CL due to kidney fx and medical therapy was recommended), extensive DVT 2016 now on Eliquis, PAD, schizophrenia, CKD stage IV, and severe mitral regurgitation who is being seen today prior to scheduled TEER.   Glenn Hawkins was initially seen by cardiology after presenting late with an inferior MI in 2013. He was not taken to the cath lab due to poor renal function and concern for contrast induced nephropathy and has been treated medically since that time. He had an extensive DVT in 2016 and has been anticoagulated with low dose Eliquis. More recently he has had several hospitalizations for acute heart failure symptoms complicated by respiratory failure. On echocardiogram 04/2022, he was found to have severe mitral regurgitation felt to be ischemic in etiology. His heart failure treatment has been complicated by Stage IV CKD. He ultimately underwent TEE confirming severe ischemic MR secondary to posterior leaflet restriction with moderately severe segmental LV systolic dysfunction and LVEF of 35%. He was seen by Dr. [name] 06/05/22 with plans to pursue [facility] [address] to evaluate for CAD.   He then presented for OP cardiac cath which showed subtotal occlusion of the mLCx and the mRCA with collaterals and severely elevated LVEDP at . He was admitted post cath for IV diuresis and AHF consultation. He responded well to diuresis  with a discharge weight at 184lb.  His hospital course was complicated by AKI with a peak Cr at 4.1. IV diuretics were held 5/13 and he resumed 5/17. Cr on DOD at 3.86. He was continued on metoprolol and Bidil was transitioned to hydralazine and Imdur due to cost. Due to hx of DVT he was continued on low dose Eliquis.   Today he is here with his sister and reports that he has been well since discharge. His weight today is up to 190lb however this was in the office with clothes. He reports compliance with diuretics although is not weighing at home on a regular basis. He denies LE or abdominal edema. No SOB. He continues to have significant fatigue. Denies palpitations, bleeding in stool or urine. No dizziness, or syncope.   Past Medical History:  Diagnosis Date   Aortic insufficiency    a. 08/2019 Echo: mild to mod AI.   C. 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 02/2011 Late presentation MI-->Myoview w/ large area of transmural infarct in LCX distribution, no ischemia.   Depressive disorder, not elsewhere classified    Esophageal reflux  Gout, unspecified    Hemorrhoids    a. 01/2016 s/p hemorrhoidectomy.   HFrEF (heart failure with reduced ejection fraction) (HCC)    a. 04/2012 Echo: EF 40%, inflat AK, Gr1 DD, mild AI/MR, triv TR; b. 11/2017 EchoP EF 30-35%, inflat HK; c. 08/2019 Echo: EF 30-35%, gr1 DD, inflat AK.   History of DVT (deep vein thrombosis)    a. Chronic Eliquis.   Hyperlipidemia LDL goal <70    Hypertension    Hypotension, unspecified    Ischemic cardiomyopathy    a. 04/2012 Echo: EF 40%, inflat AK, Gr1 DD, mild AI/MR, triv TR; b. 11/2017 EchoP EF 30-35%, inflat HK; c. 08/2019 Echo: EF 30-35%, gr1 DD, inflat AK. Nl RV size/fxn. Mild MR. Mild to mod AI.   Mitral valve  disorders(424.0)    a. 04/2012 Echo: EF 40%, mild MR.   Myocardial infarction (lateral wall) (HCC) 2013   a. 02/2011 - late presentation. Managed conservatively 2/2 CKD;  b. 02/2011 Myoview: Large transmural infarct in the LCX distribution, no ischemia.   PAD (peripheral artery disease) (HCC)    a. 08/2015 ABI/duplex: R: 0.86, L 0.96. Duplex w/ bilateral heterogeneous plaque in mid fem arteries, no significant stenoses; b. 08/2019 ABI/Duplex: prob bilat inflow dzs w/ stable ABIs (R 0.85, L 0.93).   Personal history of tobacco use, presenting hazards to health    Torn ligament    Unspecified schizophrenia, unspecified condition     Past Surgical History:  Procedure Laterality Date   BIOPSY  12/17/2020   Procedure: BIOPSY;  Surgeon: Lanelle Bal, DO;  [facility];  Service: Endoscopy;;   COLONOSCOPY     COLONOSCOPY N/A 11/10/2014   Procedure: COLONOSCOPY;  Surgeon: Ruffin Frederick, MD;  Location: [facility] ENDOSCOPY;  Service: Gastroenterology;  Laterality: N/A;   COLONOSCOPY WITH PROPOFOL N/A 05/08/2015   Surgeon: West Bali, MD; nonthrombosed external hemorrhoids, one 8 mm tubular adenoma, one 4 mm tubular adenoma.  Repeat colonoscopy in 5-10 years.   COLONOSCOPY WITH PROPOFOL N/A 12/17/2020   Procedure: COLONOSCOPY WITH PROPOFOL;  Surgeon: Lanelle Bal, DO;  Location: [facility];  Service: Endoscopy;  Laterality: N/A;  8:30am   ESOPHAGOGASTRODUODENOSCOPY (EGD) WITH PROPOFOL N/A 05/08/2015   Surgeon: West Bali, MD; LA grade a reflux esophagitis, normal stomach and duodenum.   ESOPHAGOGASTRODUODENOSCOPY (EGD) WITH PROPOFOL N/A 12/17/2020   Procedure: ESOPHAGOGASTRODUODENOSCOPY (EGD) WITH PROPOFOL;  Surgeon: Lanelle Bal, DO;  [facility]: [facility];  Service: Endoscopy;  Laterality: N/A;   EYE SURGERY Right    removal of foreign body   HEMORRHOID SURGERY N/A 02/20/2016   Procedure: EXTENSIVE HEMORRHOIDECTOMY;  Surgeon: Franky Macho, MD;  Location: [facility]  ORS;  Service: General;  Laterality: N/A;   None     POLYPECTOMY  05/08/2015   Procedure: POLYPECTOMY;  Surgeon: West Bali, MD;  Location: [facility];  Service: Endoscopy;;  transverse colon polyp   RIGHT/LEFT HEART CATH AND CORONARY ANGIOGRAPHY N/A 07/03/2022   Procedure: RIGHT/LEFT HEART CATH AND CORONARY ANGIOGRAPHY;  Surgeon: Tonny Bollman, MD;  Location: [facility] INVASIVE CV LAB;  Service: Cardiovascular;  Laterality: N/A;   TEE WITHOUT CARDIOVERSION N/A 05/26/2022   Procedure: TRANSESOPHAGEAL ECHOCARDIOGRAM (TEE);  Surgeon: Wendall Stade, MD;  Location: [facility] ENDOSCOPY;  Service: Cardiovascular;  Laterality: N/A;    Current Medications: Current Meds  Medication Sig   apixaban (ELIQUIS) 5 MG TABS tablet Take 2.5 mg by mouth 2 (two) times daily.   aspirin EC 81 MG tablet Take  81 mg by mouth daily. Swallow whole.   atorvastatin (LIPITOR) 80 MG tablet Take 1 tablet (80 mg total) by mouth daily.   brimonidine (ALPHAGAN) 0.2 % ophthalmic solution Place 1 drop into both eyes daily.   calcitRIOL (ROCALTROL) 0.25 MCG capsule Take 0.25 mcg by mouth every Monday, Wednesday, and Friday.   colchicine 0.6 MG tablet Take 0.5 tablets (0.3 mg total) by mouth daily.   Daridorexant HCl 25 MG TABS Take 25 mg by mouth at bedtime.   DOCUSATE SODIUM PO Take 1 capsule by mouth daily.   esomeprazole (NEXIUM) 20 MG capsule Take 1 capsule (20 mg total) by mouth daily before breakfast.   furosemide (LASIX) 40 MG tablet Take 1 tablet (40 mg total) by mouth 2 (two) times daily.   hydrALAZINE (APRESOLINE) 50 MG tablet Take 1.5 tablets (75 mg total) by mouth 3 (three) times daily.   isosorbide mononitrate (IMDUR) 30 MG 24 hr tablet Take 1 tablet (30 mg total) by mouth every morning. Patient needs appointment for further refills. 1 st attempt   latanoprost (XALATAN) 0.005 % ophthalmic solution Place 1 drop into both eyes at bedtime.   melatonin 3 MG TABS tablet Take 3 mg by mouth at bedtime.   metoprolol succinate  (TOPROL-XL) 100 MG 24 hr tablet Take 1 tablet (100 mg total) by mouth daily. Take with or immediately following a meal.   nitroGLYCERIN (NITROSTAT) 0.4 MG SL tablet Dissolve 1 tablet under the tongue every 5 minutes as needed for chest pain. Max of 3 doses, then 911.   OLANZapine (ZYPREXA) 10 MG tablet Take 5-15 mg by mouth See admin instructions. Take 5 mg in the morning and 15 mg at night   risperiDONE (RISPERDAL) 1 MG tablet Take 1 tablet (1 mg total) by mouth at bedtime.   sertraline (ZOLOFT) 100 MG tablet Take 100 mg by mouth daily.   sodium bicarbonate 650 MG tablet Take 650 mg by mouth 2 (two) times daily.     Allergies:   Nifedipine, Aciphex [rabeprazole sodium], Benazepril, Haloperidol, and Prilosec [omeprazole]   Social History   Socioeconomic History   Marital status: Divorced    Spouse name: Not on file   Number of children: Not on file   Years of education: Not on file   Highest education level: Not on file  Occupational History   Not on file  Tobacco Use   Smoking status: Former    Packs/day: 1.00    Years: 23.00    Additional pack years: 0.00    Total pack years: 23.00    Types: Cigarettes    Quit date: 02/25/1996    Years since quitting: 26.4   Smokeless tobacco: Never   Tobacco comments:    + 15 years of smoking  Vaping Use   Vaping Use: Never used  Substance and Sexual Activity   Alcohol use: No    Comment: Daily alcohol use 20+ years ago   Drug use: Not Currently    Types: Cocaine    Comment: History of intranasal cocaine and speed 20+ years ago.   Sexual activity: Not Currently    Birth control/protection: None  Other Topics Concern   Not on file  Social History Narrative   Not on file   Social Determinants of Health   Financial Resource Strain: Not on file  Food Insecurity: No Food Insecurity (07/09/2022)   Hunger Vital Sign    Worried About Running Out of Food in the Last Year: Never true  Ran Out of Food in the Last Year: Never true   Transportation Needs: No Transportation Needs (07/09/2022)   PRAPARE - Administrator, Civil Service (Medical): No    Lack of Transportation (Non-Medical): No  Physical Activity: Not on file  Stress: Not on file  Social Connections: Not on file     Family History: The patient's family history includes Crohn's disease in his sister. There is no history of Colon cancer.  ROS:   Please see the history of present illness.    All other systems reviewed and are negative.  EKGs/Labs/Other Studies Reviewed:    The following studies were reviewed today:  R/LHC 07/03/22:  1.  Patent left main stem and LAD without significant stenosis 2.  Subtotal occlusion of the mid circumflex and the mid RCA, RCA collateralized from a septal perforating cascade of the LAD 3.  Severely elevated LVEDP of 29 mmHg 4.  Elevated right heart pressures: RA mean 20 mmHg PA 74/39 mean 50 mmHg Wedge pressure mean 30, V wave 40 mmHg Cardiac output 6.57 L/min, cardiac index 3.21 Transpulmonic gradient 20 mmHg, PVR 3 Wood units   Recommend: Hospital admission for IV diuresis, heart failure consultation, consider transcatheter edge-to-edge repair of the mitral valve following optimization of medical therapy. Medical therapy for CAD.    TEE 05/26/22:   1. Posterior lateral hypokinesis . Left ventricular ejection fraction, by  estimation, is 35 to 40%. The left ventricle has moderately decreased  function. The left ventricle demonstrates regional wall motion  abnormalities (see scoring diagram/findings  for description). The left ventricular internal cavity size was moderately  dilated.   2. Right ventricular systolic function is mildly reduced. The right  ventricular size is mildly enlarged.   3. Left atrial size was severely dilated. No left atrial/left atrial  appendage thrombus was detected.   4. Right atrial size was moderately dilated.   5. Severe ischemic MR two jets betwen A2/P2 suitable leaflet  length,  gradients and MVA for clip . The mitral valve is abnormal. Severe mitral  valve regurgitation. No evidence of mitral stenosis.   6. Tricuspid valve regurgitation is moderate.   7. The aortic valve is tricuspid. There is mild calcification of the  aortic valve. There is mild thickening of the aortic valve. Aortic valve  regurgitation is mild. Aortic valve sclerosis is present, with no evidence  of aortic valve stenosis.   Echocardiogram 05/07/22:   1. Left ventricular ejection fraction, by estimation, is 35 to 40%. The  left ventricle has moderately decreased function. The left ventricle  demonstrates regional wall motion abnormalities (see scoring  diagram/findings for description). The left  ventricular internal cavity size was moderately to severely dilated. Left  ventricular diastolic parameters are indeterminate.   2. Right ventricular systolic function is mildly reduced. The right  ventricular size is normal. There is mildly elevated pulmonary artery  systolic pressure. The estimated right ventricular systolic pressure is  41.2 mmHg.   3. Left atrial size was severely dilated.   4. A small pericardial effusion is present. The pericardial effusion is  localized near the right atrium.   5. 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 There  is splay artifact. The mitral valve is grossly normal. Severe mitral valve  regurgitation. No evidence of  mitral stenosis.   6. The aortic valve is tricuspid. There is mild calcification of the  aortic valve. Aortic valve  regurgitation is mild to moderate. No aortic  stenosis is present.   7. The inferior vena cava is dilated in size with >50% respiratory  variability, suggesting right atrial pressure of 8 mmHg.    EKG:  EKG is ordered today.  The ekg ordered today demonstrates NSR with HR 91bpm.   Recent Labs: 05/06/2022: B Natriuretic  Peptide 1,699.1; TSH 1.413 05/07/2022: ALT 19 07/05/2022: Magnesium 1.9 07/10/2022: BUN 77; Creatinine, Ser 3.86; Potassium 3.4; Sodium 140 07/14/2022: Hemoglobin 11.9; Platelets 114   Recent Lipid Panel    Component Value Date/Time   CHOL 109 07/04/2022 1007   CHOL 191 08/02/2019 1515   TRIG 62 07/04/2022 1007   HDL 42 07/04/2022 1007   HDL 37 (L) 08/02/2019 1515   CHOLHDL 2.6 07/04/2022 1007   VLDL 12 07/04/2022 1007   LDLCALC 55 07/04/2022 1007   LDLCALC 123 (H) 08/02/2019 1515   LDLDIRECT 125 (H) 08/02/2019 1515   Risk Assessment/Calculations:    STS Surgical Risk calculation:  Procedure Type: Isolated MVR  PERIOPERATIVE OUTCOME ESTIMATE %  Operative Mortality 9.91%  Morbidity & Mortality 39.7%  Stroke 3.25%  Renal Failure 38.9%  Reoperation 10.1%  Prolonged Ventilation 19.9%  Deep Sternal Wound Infection 0.076%  Long Hospital Stay (>14 days) 20.4%  Short Hospital Stay (<6 days)* 10.5%   Procedure Type: Isolated MVr  PERIOPERATIVE OUTCOME ESTIMATE %  Operative Mortality 5.07%  Morbidity & Mortality 27.1%  Stroke 2.08%  Renal Failure 30.6%  Reoperation 8.49%  Prolonged Ventilation 12%  Deep Sternal Wound Infection 0.05%  Long Hospital Stay (>14 days) 10.8%  Short Hospital Stay (<6 days)* 27.5%   Physical Exam:    VS:  BP (!) 148/84   Pulse 91   Ht 5\' 8"  (1.727 m)   Wt 190 lb 9.6 oz (86.5 kg)   SpO2 96%   BMI 28.98 kg/m     Wt Readings from Last 3 Encounters:  07/14/22 190 lb 9.6 oz (86.5 kg)  07/10/22 185 lb (83.9 kg)  07/02/22 200 lb (90.7 kg)    General: Well developed, well nourished, NAD Lungs:Diminished bilaterally with no wheezes, rales, or rhonchi. Breathing is unlabored. Cardiovascular: RRR with S1 S2. + murmur Abdomen: Soft, non-tender, non-distended. No obvious abdominal masses. Extremities: No edema.  Neuro: Alert and oriented. No focal deficits. No facial asymmetry. MAE spontaneously. Psych: Responds to questions appropriately with normal  affect.    ASSESSMENT/PLAN:    1. Pre-operative clearance   2. Chronic systolic heart failure (HCC)   3. CKD (chronic kidney disease) stage 4, GFR 15-29 ml/min (HCC)   4. Severe mitral insufficiency    PLAN:    Severe mitral regurgitation: Recent TEE confirming severe ischemic MR secondary to posterior leaflet restriction with moderately severe segmental LV systolic dysfunction and LVEF of 35%. Plan for TEER 5/22. Pre procedure instructions reviewed with all questions answered. CHG soap given. Obtain CBC, CMET, INR, BNP today. He will stop Eliquis today (took AM dose this morning).   Acute on chronic systolic ICM:  Discharge weight last week at 184 with a weight today at 190lb in the office. Reports compliance with diuretics. No SOB. Does not appear overtly lfuid volume overloaded today. Will continue current regimen given recent issues with AKI secondary to Lasix. Obtain CMET today.   Acute on chronic kidney disease Stage IV: Baseline Cr appears to be in the 2.8-3.5 range although noted to be up to 4.1 on recent hospitalization. Diuretics held x several days and restarted at discharge. Obtain CMET  today  CAD: Cath with subtotal occlusion of the mLCx and the mRCA with collaterals and severely elevated LVEDP at . Denies anginal symptoms. Continue ASA, statin, Toprol.   Schizophrenia: Appears stable. Continue Risperdal, Zyprexa.   Hx of DVT: On chronic low dose Eliquis however he will stop today in anticipation of TEER 5/22. We will restart post procedure.   HTN: Elevated today. Will follow post procedure.   HLD: Continue statin  Medication Adjustments/Labs and Tests Ordered: Current medicines are reviewed at length with the patient today.  Concerns regarding medicines are outlined above.  Orders Placed This Encounter  Procedures   Comprehensive metabolic panel   Pro b natriuretic peptide (BNP)   Protime-INR   CBC   EKG 12-Lead   No orders of the defined types were placed in  this encounter.   Patient Instructions  Medication Instructions:  Your physician recommends that you continue on your current medications as directed. Please refer to the Current Medication list given to you today.  *If you need a refill on your cardiac medications before your next appointment, please call your pharmacy*   Lab Work: TODAY: CMET, BNP,CBC, INR If you have labs (blood work) drawn today and your tests are completely normal, you will receive your results only by: MyChart Message (if you have MyChart) OR A paper copy in the mail If you have any lab test that is abnormal or we need to change your treatment, we will call you to review the results.   Testing/Procedures: SEE INSTRUCTION LETTER   Follow-Up: At [facility], you and your health needs are our priority.  As part of our continuing mission to provide you with exceptional heart care, we have created designated Provider Care Teams.  These Care Teams include your primary Cardiologist (physician) and Advanced Practice Providers (APPs -  Physician Assistants and Nurse Practitioners) who all work together to provide you with the care you need, when you need it.  We recommend signing up for the patient portal called "MyChart".  Sign up information is provided on this After Visit Summary.  MyChart is used to connect with patients for Virtual Visits (Telemedicine).  Patients are able to view lab/test results, encounter notes, upcoming appointments, etc.  Non-urgent messages can be sent to your provider as well.   To learn more about what you can do with MyChart, go to [email or web address].    Your next appointment:   POST PROCEDURAL FOLLOW-UP WILL BE SCHEDULED AT [facility].   Signed, Georgie Chard, NP  07/15/2022 8:03 AM    [facility]

## 2022-07-11 ENCOUNTER — Telehealth: Payer: Self-pay | Admitting: *Deleted

## 2022-07-11 NOTE — Progress Notes (Signed)
  Care Coordination   Note   07/11/2022 Name: Glenn Hawkins MRN: [id number] DOB: Mar 15, 1955  Glenn Hawkins is a 67 y.o. year old male who sees Vyas, Dhruv B, MD for primary care. I reached out to [name] by phone today to offer care coordination services.  [name] was given information about Care Coordination services today including:   The Care Coordination services include support from the care team which includes your Nurse Coordinator, Clinical Social Worker, or Pharmacist.  The Care Coordination team is here to help remove barriers to the health concerns and goals most important to you. Care Coordination services are voluntary, and the patient may decline or stop services at any time by request to their care team member.   Care Coordination Consent Status: Patient agreed to services and verbal consent obtained.   Follow up plan:  Telephone appointment with care coordination team member scheduled for:  07/18/22  Encounter Outcome:  Pt. Scheduled  [facility] Coordination Care Guide  Direct Dial: [phone]

## 2022-07-14 ENCOUNTER — Ambulatory Visit: Payer: Medicare Other | Attending: Cardiology | Admitting: Cardiology

## 2022-07-14 VITALS — BP 148/84 | HR 91 | Ht 68.0 in | Wt 190.6 lb

## 2022-07-14 DIAGNOSIS — Z01818 Encounter for other preprocedural examination: Secondary | ICD-10-CM

## 2022-07-14 DIAGNOSIS — E785 Hyperlipidemia, unspecified: Secondary | ICD-10-CM

## 2022-07-14 DIAGNOSIS — Z7901 Long term (current) use of anticoagulants: Secondary | ICD-10-CM

## 2022-07-14 DIAGNOSIS — I2510 Atherosclerotic heart disease of native coronary artery without angina pectoris: Secondary | ICD-10-CM

## 2022-07-14 DIAGNOSIS — N184 Chronic kidney disease, stage 4 (severe): Secondary | ICD-10-CM

## 2022-07-14 DIAGNOSIS — Z86718 Personal history of other venous thrombosis and embolism: Secondary | ICD-10-CM | POA: Diagnosis not present

## 2022-07-14 DIAGNOSIS — I5022 Chronic systolic (congestive) heart failure: Secondary | ICD-10-CM | POA: Diagnosis not present

## 2022-07-14 DIAGNOSIS — I340 Nonrheumatic mitral (valve) insufficiency: Secondary | ICD-10-CM

## 2022-07-14 LAB — CBC
Hematocrit: 37.1 % — ABNORMAL LOW (ref 37.5–51.0)
Hemoglobin: 11.9 g/dL — ABNORMAL LOW (ref 13.0–17.7)
MCH: 29.1 pg (ref 26.6–33.0)
MCHC: 32.1 g/dL (ref 31.5–35.7)
MCV: 91 fL (ref 79–97)
Platelets: 114 10*3/uL — ABNORMAL LOW (ref 150–450)
RBC: 4.09 x10E6/uL — ABNORMAL LOW (ref 4.14–5.80)
RDW: 16 % — ABNORMAL HIGH (ref 11.6–15.4)
WBC: 5.5 10*3/uL (ref 3.4–10.8)

## 2022-07-14 LAB — PROTIME-INR
INR: 1.3 — ABNORMAL HIGH (ref 0.9–1.2)
Prothrombin Time: 12.5 s — ABNORMAL HIGH (ref 9.1–12.0)

## 2022-07-14 NOTE — Patient Instructions (Signed)
Medication Instructions:  Your physician recommends that you continue on your current medications as directed. Please refer to the Current Medication list given to you today.  *If you need a refill on your cardiac medications before your next appointment, please call your pharmacy*   Lab Work: TODAY: CMET, BNP,CBC, INR If you have labs (blood work) drawn today and your tests are completely normal, you will receive your results only by: MyChart Message (if you have MyChart) OR A paper copy in the mail If you have any lab test that is abnormal or we need to change your treatment, we will call you to review the results.   Testing/Procedures: SEE INSTRUCTION LETTER   Follow-Up: At [facility], you and your health needs are our priority.  As part of our continuing mission to provide you with exceptional heart care, we have created designated Provider Care Teams.  These Care Teams include your primary Cardiologist (physician) and Advanced Practice Providers (APPs -  Physician Assistants and Nurse Practitioners) who all work together to provide you with the care you need, when you need it.  We recommend signing up for the patient portal called "MyChart".  Sign up information is provided on this After Visit Summary.  MyChart is used to connect with patients for Virtual Visits (Telemedicine).  Patients are able to view lab/test results, encounter notes, upcoming appointments, etc.  Non-urgent messages can be sent to your provider as well.   To learn more about what you can do with MyChart, go to [email or web address].    Your next appointment:   POST PROCEDURAL FOLLOW-UP WILL BE SCHEDULED AT [facility].

## 2022-07-15 ENCOUNTER — Encounter (HOSPITAL_COMMUNITY): Payer: Self-pay | Admitting: Cardiovascular Disease

## 2022-07-15 ENCOUNTER — Other Ambulatory Visit: Payer: Self-pay

## 2022-07-15 LAB — COMPREHENSIVE METABOLIC PANEL
ALT: 19 IU/L (ref 0–44)
AST: 26 IU/L (ref 0–40)
Albumin/Globulin Ratio: 1.5 (ref 1.2–2.2)
Albumin: 3.8 g/dL — ABNORMAL LOW (ref 3.9–4.9)
Alkaline Phosphatase: 105 IU/L (ref 44–121)
BUN/Creatinine Ratio: 15 (ref 10–24)
BUN: 49 mg/dL — ABNORMAL HIGH (ref 8–27)
Bilirubin Total: 0.4 mg/dL (ref 0.0–1.2)
CO2: 18 mmol/L — ABNORMAL LOW (ref 20–29)
Calcium: 8.9 mg/dL (ref 8.6–10.2)
Chloride: 104 mmol/L (ref 96–106)
Creatinine, Ser: 3.32 mg/dL — ABNORMAL HIGH (ref 0.76–1.27)
Globulin, Total: 2.5 g/dL (ref 1.5–4.5)
Glucose: 173 mg/dL — ABNORMAL HIGH (ref 70–99)
Potassium: 3.3 mmol/L — ABNORMAL LOW (ref 3.5–5.2)
Sodium: 145 mmol/L — ABNORMAL HIGH (ref 134–144)
Total Protein: 6.3 g/dL (ref 6.0–8.5)
eGFR: 20 mL/min/{1.73_m2} — ABNORMAL LOW (ref >59)

## 2022-07-15 LAB — PRO B NATRIURETIC PEPTIDE: NT-Pro BNP: 11718 pg/mL — ABNORMAL HIGH (ref 0–376)

## 2022-07-16 ENCOUNTER — Ambulatory Visit (HOSPITAL_COMMUNITY)
Admission: RE | Admit: 2022-07-16 | Discharge: 2022-07-16 | Disposition: A | Discharge status: Home / Self Care | Payer: Medicare Other | Attending: Cardiovascular Disease | Admitting: Cardiovascular Disease

## 2022-07-16 ENCOUNTER — Inpatient Hospital Stay (HOSPITAL_COMMUNITY): Payer: Medicare Other

## 2022-07-16 ENCOUNTER — Inpatient Hospital Stay (HOSPITAL_COMMUNITY): Payer: Medicare Other | Admitting: Certified Registered Nurse Anesthetist

## 2022-07-16 ENCOUNTER — Encounter (HOSPITAL_COMMUNITY): Payer: Self-pay | Admitting: Cardiovascular Disease

## 2022-07-16 ENCOUNTER — Inpatient Hospital Stay (HOSPITAL_BASED_OUTPATIENT_CLINIC_OR_DEPARTMENT_OTHER): Payer: Medicare Other

## 2022-07-16 ENCOUNTER — Inpatient Hospital Stay (HOSPITAL_BASED_OUTPATIENT_CLINIC_OR_DEPARTMENT_OTHER): Payer: Medicare Other | Admitting: Certified Registered Nurse Anesthetist

## 2022-07-16 ENCOUNTER — Other Ambulatory Visit: Payer: Self-pay

## 2022-07-16 ENCOUNTER — Encounter (HOSPITAL_COMMUNITY)
Admission: RE | Disposition: A | Discharge status: Home / Self Care | Payer: Self-pay | Source: Home / Self Care | Attending: Cardiovascular Disease

## 2022-07-16 DIAGNOSIS — E785 Hyperlipidemia, unspecified: Secondary | ICD-10-CM | POA: Diagnosis not present

## 2022-07-16 DIAGNOSIS — Z87891 Personal history of nicotine dependence: Secondary | ICD-10-CM | POA: Diagnosis not present

## 2022-07-16 DIAGNOSIS — Z86718 Personal history of other venous thrombosis and embolism: Secondary | ICD-10-CM | POA: Insufficient documentation

## 2022-07-16 DIAGNOSIS — Z539 Procedure and treatment not carried out, unspecified reason: Secondary | ICD-10-CM | POA: Insufficient documentation

## 2022-07-16 DIAGNOSIS — I130 Hypertensive heart and chronic kidney disease with heart failure and stage 1 through stage 4 chronic kidney disease, or unspecified chronic kidney disease: Secondary | ICD-10-CM | POA: Diagnosis not present

## 2022-07-16 DIAGNOSIS — I255 Ischemic cardiomyopathy: Secondary | ICD-10-CM | POA: Diagnosis not present

## 2022-07-16 DIAGNOSIS — F209 Schizophrenia, unspecified: Secondary | ICD-10-CM | POA: Insufficient documentation

## 2022-07-16 DIAGNOSIS — N183 Chronic kidney disease, stage 3 unspecified: Secondary | ICD-10-CM | POA: Diagnosis present

## 2022-07-16 DIAGNOSIS — I080 Rheumatic disorders of both mitral and aortic valves: Secondary | ICD-10-CM | POA: Diagnosis not present

## 2022-07-16 DIAGNOSIS — I252 Old myocardial infarction: Secondary | ICD-10-CM | POA: Diagnosis not present

## 2022-07-16 DIAGNOSIS — Z7982 Long term (current) use of aspirin: Secondary | ICD-10-CM | POA: Insufficient documentation

## 2022-07-16 DIAGNOSIS — N184 Chronic kidney disease, stage 4 (severe): Secondary | ICD-10-CM | POA: Insufficient documentation

## 2022-07-16 DIAGNOSIS — F200 Paranoid schizophrenia: Secondary | ICD-10-CM | POA: Diagnosis present

## 2022-07-16 DIAGNOSIS — I82411 Acute embolism and thrombosis of right femoral vein: Secondary | ICD-10-CM | POA: Diagnosis present

## 2022-07-16 DIAGNOSIS — I10 Essential (primary) hypertension: Secondary | ICD-10-CM | POA: Diagnosis present

## 2022-07-16 DIAGNOSIS — I739 Peripheral vascular disease, unspecified: Secondary | ICD-10-CM | POA: Insufficient documentation

## 2022-07-16 DIAGNOSIS — I340 Nonrheumatic mitral (valve) insufficiency: Secondary | ICD-10-CM

## 2022-07-16 DIAGNOSIS — I5042 Chronic combined systolic (congestive) and diastolic (congestive) heart failure: Secondary | ICD-10-CM | POA: Insufficient documentation

## 2022-07-16 DIAGNOSIS — I509 Heart failure, unspecified: Secondary | ICD-10-CM

## 2022-07-16 DIAGNOSIS — I110 Hypertensive heart disease with heart failure: Secondary | ICD-10-CM | POA: Diagnosis not present

## 2022-07-16 DIAGNOSIS — I2510 Atherosclerotic heart disease of native coronary artery without angina pectoris: Secondary | ICD-10-CM

## 2022-07-16 DIAGNOSIS — Z79899 Other long term (current) drug therapy: Secondary | ICD-10-CM | POA: Diagnosis not present

## 2022-07-16 DIAGNOSIS — Z20822 Contact with and (suspected) exposure to covid-19: Secondary | ICD-10-CM | POA: Diagnosis not present

## 2022-07-16 DIAGNOSIS — I5022 Chronic systolic (congestive) heart failure: Secondary | ICD-10-CM | POA: Diagnosis present

## 2022-07-16 DIAGNOSIS — Z7901 Long term (current) use of anticoagulants: Secondary | ICD-10-CM | POA: Insufficient documentation

## 2022-07-16 DIAGNOSIS — Z01818 Encounter for other preprocedural examination: Secondary | ICD-10-CM | POA: Diagnosis not present

## 2022-07-16 DIAGNOSIS — I700 Atherosclerosis of aorta: Secondary | ICD-10-CM | POA: Diagnosis not present

## 2022-07-16 HISTORY — DX: Cardiac murmur, unspecified: R01.1

## 2022-07-16 HISTORY — PX: MITRAL VALVE REPAIR: CATH118311

## 2022-07-16 HISTORY — PX: TEE WITHOUT CARDIOVERSION: SHX5443

## 2022-07-16 HISTORY — DX: Dyspnea, unspecified: R06.00

## 2022-07-16 LAB — POCT I-STAT, CHEM 8
BUN: 36 mg/dL — ABNORMAL HIGH (ref 8–23)
Calcium, Ion: 1.14 mmol/L — ABNORMAL LOW (ref 1.15–1.40)
Chloride: 109 mmol/L (ref 98–111)
Creatinine, Ser: 3.4 mg/dL — ABNORMAL HIGH (ref 0.61–1.24)
Glucose, Bld: 100 mg/dL — ABNORMAL HIGH (ref 70–99)
HCT: 37 % — ABNORMAL LOW (ref 39.0–52.0)
Hemoglobin: 12.6 g/dL — ABNORMAL LOW (ref 13.0–17.0)
Potassium: 3.2 mmol/L — ABNORMAL LOW (ref 3.5–5.1)
Sodium: 145 mmol/L (ref 135–145)
TCO2: 25 mmol/L (ref 22–32)

## 2022-07-16 LAB — ECHO TEE
AV Mean grad: 2.5 mmHg
AV Peak grad: 4.5 mmHg
Ao pk vel: 1.06 m/s

## 2022-07-16 LAB — TYPE AND SCREEN
ABO/RH(D): O POS
Antibody Screen: NEGATIVE

## 2022-07-16 LAB — SURGICAL PCR SCREEN
MRSA, PCR: NEGATIVE
Staphylococcus aureus: NEGATIVE

## 2022-07-16 LAB — SARS CORONAVIRUS 2 BY RT PCR: SARS Coronavirus 2 by RT PCR: NEGATIVE

## 2022-07-16 SURGERY — MITRAL VALVE REPAIR
Anesthesia: General

## 2022-07-16 MED ORDER — SODIUM CHLORIDE 0.9 % IV SOLN: INTRAVENOUS | Status: DC

## 2022-07-16 MED ORDER — CHLORHEXIDINE GLUCONATE CLOTH 2 % EX PADS: 6.0000 | MEDICATED_PAD | Freq: Once | CUTANEOUS | Status: DC

## 2022-07-16 MED ORDER — CEFAZOLIN SODIUM-DEXTROSE 2-4 GM/100ML-% IV SOLN
2.0000 g | INTRAVENOUS | Status: AC
  Administered 2022-07-16: 2 g via INTRAVENOUS
  Filled 2022-07-16: qty 100

## 2022-07-16 MED ORDER — HEPARIN (PORCINE) IN NACL 1000-0.9 UT/500ML-% IV SOLN
INTRAVENOUS | Status: DC | PRN
  Administered 2022-07-16: 500 mL

## 2022-07-16 MED ORDER — ROCURONIUM BROMIDE 10 MG/ML (PF) SYRINGE
PREFILLED_SYRINGE | INTRAVENOUS | Status: DC | PRN
  Administered 2022-07-16: 70 mg via INTRAVENOUS

## 2022-07-16 MED ORDER — HEPARIN (PORCINE) IN NACL 2000-0.9 UNIT/L-% IV SOLN
INTRAVENOUS | Status: DC | PRN
  Administered 2022-07-16 (×3): 1000 mL

## 2022-07-16 MED ORDER — FLUMAZENIL 0.5 MG/5ML IV SOLN
INTRAVENOUS | Status: DC | PRN
  Administered 2022-07-16: .2 mg via INTRAVENOUS

## 2022-07-16 MED ORDER — PHENYLEPHRINE HCL-NACL 20-0.9 MG/250ML-% IV SOLN
INTRAVENOUS | Status: DC | PRN
  Administered 2022-07-16: 15 ug/min via INTRAVENOUS

## 2022-07-16 MED ORDER — PROPOFOL 500 MG/50ML IV EMUL
INTRAVENOUS | Status: DC | PRN
  Administered 2022-07-16: 125 ug/kg/min via INTRAVENOUS

## 2022-07-16 MED ORDER — DEXAMETHASONE SODIUM PHOSPHATE 10 MG/ML IJ SOLN
INTRAMUSCULAR | Status: DC | PRN
  Administered 2022-07-16: 5 mg via INTRAVENOUS

## 2022-07-16 MED ORDER — FLUMAZENIL 1 MG/10ML IV SOLN
INTRAVENOUS | Status: AC
  Filled 2022-07-16: qty 10

## 2022-07-16 MED ORDER — FENTANYL CITRATE (PF) 250 MCG/5ML IJ SOLN
INTRAMUSCULAR | Status: DC | PRN
  Administered 2022-07-16: 100 ug via INTRAVENOUS

## 2022-07-16 MED ORDER — SUGAMMADEX SODIUM 200 MG/2ML IV SOLN
INTRAVENOUS | Status: DC | PRN
  Administered 2022-07-16: 200 mg via INTRAVENOUS
  Administered 2022-07-16: 400 mg via INTRAVENOUS
  Administered 2022-07-16: 200 mg via INTRAVENOUS

## 2022-07-16 MED ORDER — CHLORHEXIDINE GLUCONATE 0.12 % MT SOLN
15.0000 mL | Freq: Once | OROMUCOSAL | Status: AC
  Administered 2022-07-16: 15 mL via OROMUCOSAL
  Filled 2022-07-16: qty 15

## 2022-07-16 MED ORDER — PHENYLEPHRINE 80 MCG/ML (10ML) SYRINGE FOR IV PUSH (FOR BLOOD PRESSURE SUPPORT)
PREFILLED_SYRINGE | INTRAVENOUS | Status: DC | PRN
  Administered 2022-07-16: 80 ug via INTRAVENOUS

## 2022-07-16 MED ORDER — LIDOCAINE 2% (20 MG/ML) 5 ML SYRINGE
INTRAMUSCULAR | Status: DC | PRN
  Administered 2022-07-16: 60 mg via INTRAVENOUS

## 2022-07-16 MED ORDER — MIDAZOLAM HCL 2 MG/2ML IJ SOLN
INTRAMUSCULAR | Status: AC
  Filled 2022-07-16: qty 2

## 2022-07-16 MED ORDER — FENTANYL CITRATE (PF) 100 MCG/2ML IJ SOLN
INTRAMUSCULAR | Status: AC
  Filled 2022-07-16: qty 2

## 2022-07-16 MED ORDER — ONDANSETRON HCL 4 MG/2ML IJ SOLN
INTRAMUSCULAR | Status: DC | PRN
  Administered 2022-07-16: 4 mg via INTRAVENOUS

## 2022-07-16 MED ORDER — MIDAZOLAM HCL 2 MG/2ML IJ SOLN
INTRAMUSCULAR | Status: DC | PRN
  Administered 2022-07-16: 2 mg via INTRAVENOUS

## 2022-07-16 MED ORDER — PROPOFOL 10 MG/ML IV BOLUS
INTRAVENOUS | Status: DC | PRN
  Administered 2022-07-16: 90 mg via INTRAVENOUS
  Administered 2022-07-16: 50 mg via INTRAVENOUS

## 2022-07-16 SURGICAL SUPPLY — 12 items
CLOSURE PERCLOSE PROSTYLE (VASCULAR PRODUCTS) IMPLANT
KIT HEART LEFT (KITS) ×2 IMPLANT
KIT MICROPUNCTURE NIT STIFF (SHEATH) IMPLANT
KIT VERSACROSS LRG ACCESS (CATHETERS) IMPLANT
PACK CARDIAC CATHETERIZATION (CUSTOM PROCEDURE TRAY) ×1 IMPLANT
SHEATH DILAT COONS TAPER 22F (SHEATH) IMPLANT
SHEATH PINNACLE 8F 10CM (SHEATH) IMPLANT
SHEATH PROBE COVER 6X72 (BAG) ×1 IMPLANT
STOPCOCK MORSE 400PSI 3WAY (MISCELLANEOUS) ×6 IMPLANT
TRANSDUCER W/STOPCOCK (MISCELLANEOUS) ×1 IMPLANT
TUBING ART PRESS 72  MALE/FEM (TUBING) ×1
TUBING ART PRESS 72 MALE/FEM (TUBING) ×1 IMPLANT

## 2022-07-16 NOTE — Discharge Instructions (Addendum)
Radial Site Care   This sheet gives you information about how to care for yourself after your procedure. Your health care provider may also give you more specific instructions. If you have problems or questions, contact your health care provider. What can I expect after the procedure? After the procedure, it is common to have: Bruising and tenderness at the catheter insertion area. Follow these instructions at home:  Insertion site care Follow instructions from your health care provider about how to take care of your insertion site. Make sure you: Wash your hands with soap and water before you change your bandage (dressing). If soap and water are not available, use hand sanitizer. Remove your dressing as told by your health care provider. In 24 hours Check your insertion site every day for signs of infection. Check for: Redness, swelling, or pain. Pus or a bad smell. Warmth. You may shower 24-48 hours after the procedure. Do not apply powder or lotion to the site.  Activity For 24 hours after the procedure, or as directed by your health care provider: Do not push or pull heavy objects with the affected arm. Do not drive yourself home from the hospital or clinic. You may drive 24 hours after the procedure unless your health care provider tells you not to. Do not lift anything that is heavier than 10 lb (4.5 kg), or the limit that you are told, until your health care provider says that it is safe.  For 2 days   

## 2022-07-16 NOTE — Transfer of Care (Signed)
Immediate Anesthesia Transfer of Care Note  Patient: Glenn Hawkins  Procedure(s) Performed: MITRAL VALVE REPAIR TRANSESOPHAGEAL ECHOCARDIOGRAM  Patient Location: PACU  Anesthesia Type:General  Level of Consciousness: drowsy and patient cooperative  Airway & Oxygen Therapy: Patient Spontanous Breathing and Patient connected to nasal cannula oxygen  Post-op Assessment: Report given to RN, Post -op Vital signs reviewed and stable, and Patient moving all extremities X 4  Post vital signs: Reviewed and stable  Last Vitals:  Vitals Value Taken Time  BP 140/82 07/16/22 1145  Temp 36.5 C 07/16/22 1144  Pulse 79 07/16/22 1146  Resp 18 07/16/22 1146  SpO2 98 % 07/16/22 1146  Vitals shown include unvalidated device data.  Last Pain:  Vitals:   07/16/22 1144  TempSrc: Temporal  PainSc: 0-No pain         Complications: There were no known notable events for this encounter.

## 2022-07-16 NOTE — Anesthesia Preprocedure Evaluation (Signed)
Anesthesia Evaluation  Patient identified by MRN, date of birth, ID band Patient awake    Reviewed: Allergy & Precautions, NPO status , Patient's Chart, lab work & pertinent test results  History of Anesthesia Complications Negative for: history of anesthetic complications  Airway Mallampati: II  TM Distance: >3 FB Neck ROM: Full    Dental  (+) Missing,    Pulmonary shortness of breath, neg sleep apnea, neg COPD, neg recent URI, former smoker   breath sounds clear to auscultation       Cardiovascular hypertension, Pt. on medications + CAD, + Past MI, + Peripheral Vascular Disease, +CHF and + DOE  + Valvular Problems/Murmurs MR  Rhythm:Regular   1. Posterior lateral hypokinesis . Left ventricular ejection fraction, by  estimation, is 35 to 40%. The left ventricle has moderately decreased  function. The left ventricle demonstrates regional wall motion  abnormalities (see scoring diagram/findings  for description). The left ventricular internal cavity size was moderately  dilated.   2. Right ventricular systolic function is mildly reduced. The right  ventricular size is mildly enlarged.   3. Left atrial size was severely dilated. No left atrial/left atrial  appendage thrombus was detected.   4. Right atrial size was moderately dilated.   5. Severe ischemic MR two jets betwen A2/P2 suitable leaflet length,  gradients and MVA for clip . The mitral valve is abnormal. Severe mitral  valve regurgitation. No evidence of mitral stenosis.   6. Tricuspid valve regurgitation is moderate.   7. The aortic valve is tricuspid. There is mild calcification of the  aortic valve. There is mild thickening of the aortic valve. Aortic valve  regurgitation is mild. Aortic valve sclerosis is present, with no evidence  of aortic valve stenosis.    1.  Patent left main stem and LAD without significant stenosis 2.  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  Severely elevated LVEDP of 29 mmHg 4.  Elevated right heart pressures:  RA mean 20 mmHg  PA 74/39 mean 50 mmHg  Wedge pressure mean 30, V wave 40 mmHg  Cardiac output 6.57 L/min, cardiac index 3.21  Transpulmonic gradient 20 mmHg, PVR 3 Wood units   Recommend: Hospital admission for IV diuresis, heart failure consultation, consider transcatheter edge-to-edge repair of the mitral valve following optimization of medical therapy. Medical therapy for CAD.     Neuro/Psych  PSYCHIATRIC DISORDERS  Depression    negative neurological ROS     GI/Hepatic Neg liver ROS,GERD  Medicated,,  Endo/Other  negative endocrine ROS    Renal/GU Renal diseaseLab Results      Component                Value               Date                      CREATININE               3.32 (H)            07/14/2022                Musculoskeletal   Abdominal   Peds  Hematology  (+) Blood dyscrasia, anemia Lab Results      Component                Value  Date                      WBC                      5.5                 07/14/2022                HGB                      11.9 (L)            07/14/2022                HCT                      37.1 (L)            07/14/2022                MCV                      91                  07/14/2022                PLT                      114 (L)             07/14/2022              Anesthesia Other Findings   Reproductive/Obstetrics                             Anesthesia Physical Anesthesia Plan  ASA: 4  Anesthesia Plan: General   Post-op Pain Management: Minimal or no pain anticipated   Induction: Intravenous  PONV Risk Score and Plan: 2 and Propofol infusion, TIVA and Treatment may vary due to age or medical condition  Airway Management Planned: Oral ETT  Additional Equipment: Arterial line  Intra-op Plan:    Post-operative Plan: Extubation in OR  Informed Consent: I have reviewed the patients History and Physical, chart, labs and discussed the procedure including the risks, benefits and alternatives for the proposed anesthesia with the patient or authorized representative who has indicated his/her understanding and acceptance.     Dental advisory given  Plan Discussed with: CRNA  Anesthesia Plan Comments:        Anesthesia Quick Evaluation

## 2022-07-16 NOTE — Interval H&P Note (Signed)
History and Physical Interval Note:  07/16/2022 9:58 AM  Glenn Hawkins  has presented today for surgery, with the diagnosis of severe mitral regurgitation.  The various methods of treatment have been discussed with the patient and family. After consideration of risks, benefits and other options for treatment, the patient has consented to  Procedure(s): MITRAL VALVE REPAIR (N/A) TRANSESOPHAGEAL ECHOCARDIOGRAM (N/A) as a surgical intervention.  The patient's history has been reviewed, patient examined, no change in status, stable for surgery.  I have reviewed the patient's chart and labs.  Questions were answered to the patient's satisfaction.     Tonny Bollman

## 2022-07-16 NOTE — Anesthesia Procedure Notes (Signed)
Arterial Line Insertion Start/End5/22/2024 9:40 AM, 07/16/2022 9:50 AM Performed by: Maxine Glenn, CRNA, CRNA  Patient location: Pre-op. Preanesthetic checklist: patient identified, IV checked, site marked, risks and benefits discussed, surgical consent, monitors and equipment checked, pre-op evaluation, timeout performed and anesthesia consent Lidocaine 1% used for infiltration Right, radial was placed Catheter size: 20 G Hand hygiene performed  and maximum sterile barriers used   Attempts: 1 Procedure performed without using ultrasound guided technique. Following insertion, dressing applied and Biopatch. Post procedure assessment: normal and unchanged

## 2022-07-16 NOTE — Anesthesia Procedure Notes (Signed)
Procedure Name: Intubation Date/Time: 07/16/2022 10:34 AM  Performed by: Alease Medina, CRNAPre-anesthesia Checklist: Patient identified, Emergency Drugs available, Suction available and Patient being monitored Patient Re-evaluated:Patient Re-evaluated prior to induction Oxygen Delivery Method: Circle system utilized Preoxygenation: Pre-oxygenation with 100% oxygen Induction Type: IV induction Ventilation: Mask ventilation without difficulty Laryngoscope Size: Mac and 4 Grade View: Grade I Tube type: Oral Tube size: 7.5 mm Number of attempts: 1 Airway Equipment and Method: Stylet and Oral airway Placement Confirmation: ETT inserted through vocal cords under direct vision, positive ETCO2 and breath sounds checked- equal and bilateral Secured at: 23 cm Tube secured with: Tape Dental Injury: Teeth and Oropharynx as per pre-operative assessment

## 2022-07-16 NOTE — Discharge Summary (Signed)
[facility]   MULTIDISCIPLINARY HEART VALVE TEAM  Discharge Summary    Patient ID: Glenn Hawkins MRN: [id number]; DOB: 23-Apr-1955  Admit date: 07/16/2022 Discharge date: 07/16/2022  Primary Care Provider: [name], MD  Primary Cardiologist: [name], MD   Discharge Diagnoses    Principal Problem:   Severe mitral regurgitation Active Problems:   Hypertension   Hyperlipidemia   Cardiomyopathy, ischemic   Chronic systolic heart failure (HCC)   Acute deep vein thrombosis (DVT) of femoral vein of right lower extremity (HCC)   CKD (chronic kidney disease) stage 3, GFR 30-59 ml/min (HCC)   Hyperlipidemia LDL goal <70   Chronic anticoagulation   Paranoid schizophrenia (HCC)  Allergies Allergies  Allergen Reactions   Nifedipine Diarrhea   Aciphex [Rabeprazole Sodium] Diarrhea   Benazepril Other (See Comments)    Unknown   Haloperidol Anxiety    Causes severe anxiety   Prilosec [Omeprazole] Diarrhea   Diagnostic Studies/Procedures    None   History of Present Illness     Glenn Hawkins is a 67 y.o. male with a history of CAD, ischemic cardiomyopathy, chronic systolic heart failure, MI 2013 (not even taken to [facility] due to kidney fx and medical therapy was recommended), extensive DVT 2016 now on Eliquis, PAD, schizophrenia, CKD stage IV, and severe mitral regurgitation who presented to [facility] for scheduled TEER 07/16/22.    Glenn Hawkins was initially seen by cardiology after presenting late with an inferior MI in 2013. He was not taken to the cath lab due to poor renal function and concern for contrast induced nephropathy and has been treated medically since that time. He had an extensive DVT in 2016 and has been anticoagulated with low dose Eliquis. More recently he has had several hospitalizations for acute heart failure symptoms complicated by respiratory failure. On echocardiogram 04/2022, he was found to have severe mitral regurgitation felt to be ischemic in  etiology. His heart failure treatment has been complicated by Stage IV CKD. He ultimately underwent TEE confirming severe ischemic MR secondary to posterior leaflet restriction with moderately severe segmental LV systolic dysfunction and LVEF of 35%. He was seen by Dr. [name] 06/05/22 with plans to pursue [facility] to evaluate for CAD.    He then presented for OP cardiac cath which showed subtotal occlusion of the mLCx and the mRCA with collaterals and severely elevated LVEDP at . He was admitted post cath for IV diuresis and AHF consultation. He responded well to diuresis with a discharge weight at 184lb.  His hospital course was complicated by AKI with a peak Cr at 4.1. IV diuretics were held 5/13 and he resumed 5/17. Cr on DOD at 3.86. He was continued on metoprolol and Bidil was transitioned to hydralazine and Imdur due to cost. Due to hx of DVT he was continued on low dose Eliquis.   He was then evaluated by the multidisciplinary valve team and felt to have severe, symptomatic mitral regurgitation and to be a suitable candidate for TEER, which was set up for 07/16/22.     Hospital Course    Moderate/ severe mitral regurgitation: Recent TEE confirmed severe ischemic MR secondary to posterior leaflet restriction with moderately severe segmental LV systolic dysfunction and LVEF of 35%. Plan was for TEER 5/22 with Dr. [name]. At case start, TEE showed no more than 1+ mitral regurgitation. Suspect that with his recent hospital admission, medical therapy, and diuresis, he had marked reduction in mitral regurgitation. Given this, the procedure was  cancelled and he was transferred to the recovery area for discharge later today. He will need close OP follow up with the AHF team. We will resume all PTA medications with no changes at this time.   Chronic systolic ICM:  Recent hospital discharge weight at 184lb with close follow up weight at 190lb. Reports poor diet compliance and would benefit from diet  counseling. Continue current regimen.   Chronic kidney disease Stage IV: Baseline Cr appears to be in the 2.8-3.5 range although noted to be up to 4.1 on recent hospitalization. Diuretics held x several days and restarted at discharge. Cr noted to be improved to 3.32 on pre procedure labs.    CAD: Cath with subtotal occlusion of the mLCx and the mRCA with collaterals and severely elevated LVEDP at . Denies anginal symptoms. Continue ASA, statin, Toprol.    Schizophrenia: Appears stable. Continue Risperdal, Zyprexa.    Hx of DVT: Restart Eliquis today.    HTN: No changes today. Follow closely in the OP setting.    HLD: Continue statin  The patient has been seen and examined by Dr. [name] who feels that he is stable and ready for discharge today after post TEE protocol steps have been met. He has been scheduled for close follow up with AHF team on 6/4 at 10:30am.  _____________  Discharge Vitals Blood pressure (!) 147/90, pulse 73, temperature 98.2 F (36.8 C), temperature source Temporal, resp. rate 14, height 5\' 9"  (1.753 m), weight 83.9 kg, SpO2 97 %.  Filed Weights   07/16/22 0901  Weight: 83.9 kg   Labs & Radiologic Studies    CBC Recent Labs    07/14/22 0915  WBC 5.5  HGB 11.9*  HCT 37.1*  MCV 91  PLT 114*   Basic Metabolic Panel Recent Labs    16/10/96 0915  NA 145*  K 3.3*  CL 104  CO2 18*  GLUCOSE 173*  BUN 49*  CREATININE 3.32*  CALCIUM 8.9   Liver Function Tests Recent Labs    07/14/22 0915  AST 26  ALT 19  ALKPHOS 105  BILITOT 0.4  PROT 6.3  ALBUMIN 3.8*   No results for input(s): "LIPASE", "AMYLASE" in the last 72 hours. Cardiac Enzymes No results for input(s): "CKTOTAL", "CKMB", "CKMBINDEX", "TROPONINI" in the last 72 hours. BNP Invalid input(s): "POCBNP" D-Dimer No results for input(s): "DDIMER" in the last 72 hours. Hemoglobin A1C No results for input(s): "HGBA1C" in the last 72 hours. Fasting Lipid Panel No results for  input(s): "CHOL", "HDL", "LDLCALC", "TRIG", "CHOLHDL", "LDLDIRECT" in the last 72 hours. Thyroid Function Tests No results for input(s): "TSH", "T4TOTAL", "T3FREE", "THYROIDAB" in the last 72 hours.  Invalid input(s): "FREET3" _____________  CARDIAC CATHETERIZATION  Result Date: 07/16/2022 Cancelled TEER with MitraClip due to marked reduction in baseline MR to 1+ on TEE imaging at the time of today's scheduled procedure.   EP STUDY  Result Date: 07/16/2022 Cancelled TEER with MitraClip due to marked reduction in baseline MR to 1+ on TEE imaging at the time of today's scheduled procedure.   CARDIAC CATHETERIZATION  Result Date: 07/03/2022 1.  Patent left main stem and LAD without significant stenosis 2.  Subtotal occlusion of the mid circumflex and the mid RCA, RCA collateralized from a septal perforating cascade of the LAD 3.  Severely elevated LVEDP of 29 mmHg 4.  Elevated right heart pressures: RA mean 20 mmHg PA 74/39 mean 50 mmHg Wedge pressure mean 30, V wave 40 mmHg Cardiac output 6.57 L/min,  cardiac index 3.21 Transpulmonic gradient 20 mmHg, PVR 3 Wood units Recommend: Hospital admission for IV diuresis, heart failure consultation, consider transcatheter edge-to-edge repair of the mitral valve following optimization of medical therapy. Medical therapy for CAD.   Disposition   Pt is being discharged home today in good condition.  Follow-up Plans & Appointments    Follow-up Information     [facility] Follow up on 07/29/2022.   Specialty: Cardiology Why: at 10:30am. Contact information: [address] [phone] mc [address] [place] [address] [phone]               Discharge Instructions     (HEART FAILURE PATIENTS) Call MD:  Anytime you have any of the following symptoms: 1) 3 pound weight gain in 24 hours or 5 pounds in 1 week 2) shortness of breath, with or without a dry hacking cough 3) swelling in the hands,  feet or stomach 4) if you have to sleep on extra pillows at night in order to breathe.   Complete by: As directed    Call MD for:  difficulty breathing, headache or visual disturbances   Complete by: As directed    Call MD for:  extreme fatigue   Complete by: As directed    Call MD for:  hives   Complete by: As directed    Call MD for:  persistant dizziness or light-headedness   Complete by: As directed    Call MD for:  persistant nausea and vomiting   Complete by: As directed    Call MD for:  redness, tenderness, or signs of infection (pain, swelling, redness, odor or green/yellow discharge around incision site)   Complete by: As directed    Call MD for:  severe uncontrolled pain   Complete by: As directed    Call MD for:  temperature >100.4   Complete by: As directed    Diet - low sodium heart healthy   Complete by: As directed    Increase activity slowly   Complete by: As directed       Discharge Medications   Allergies as of 07/16/2022       Reactions   Nifedipine Diarrhea   Aciphex [rabeprazole Sodium] Diarrhea   Benazepril Other (See Comments)   Unknown   Haloperidol Anxiety   Causes severe anxiety   Prilosec [omeprazole] Diarrhea        Medication List     TAKE these medications    aspirin EC 81 MG tablet Take 81 mg by mouth daily. Swallow whole.   atorvastatin 80 MG tablet Commonly known as: LIPITOR Take 1 tablet (80 mg total) by mouth daily.   brimonidine 0.2 % ophthalmic solution Commonly known as: ALPHAGAN Place 1 drop into both eyes daily.   calcitRIOL 0.25 MCG capsule Commonly known as: ROCALTROL Take 0.25 mcg by mouth every Monday, Wednesday, and Friday.   colchicine 0.6 MG tablet Take 0.5 tablets (0.3 mg total) by mouth daily.   Daridorexant HCl 25 MG Tabs Take 25 mg by mouth at bedtime.   DOCUSATE SODIUM PO Take 1 capsule by mouth daily.   Eliquis 5 MG Tabs tablet Generic drug: apixaban Take 2.5 mg by mouth 2 (two) times daily.    esomeprazole 20 MG capsule Commonly known as: NexIUM Take 1 capsule (20 mg total) by mouth daily before breakfast.   furosemide 40 MG tablet Commonly known as: LASIX Take 1 tablet (40 mg total) by mouth 2 (two) times daily.  hydrALAZINE 50 MG tablet Commonly known as: APRESOLINE Take 1.5 tablets (75 mg total) by mouth 3 (three) times daily.   isosorbide mononitrate 30 MG 24 hr tablet Commonly known as: IMDUR Take 1 tablet (30 mg total) by mouth every morning. Patient needs appointment for further refills. 1 st attempt   latanoprost 0.005 % ophthalmic solution Commonly known as: XALATAN Place 1 drop into both eyes at bedtime.   melatonin 3 MG Tabs tablet Take 3 mg by mouth at bedtime.   metoprolol succinate 100 MG 24 hr tablet Commonly known as: TOPROL-XL Take 1 tablet (100 mg total) by mouth daily. Take with or immediately following a meal.   nitroGLYCERIN 0.4 MG SL tablet Commonly known as: Nitrostat Dissolve 1 tablet under the tongue every 5 minutes as needed for chest pain. Max of 3 doses, then 911.   OLANZapine 10 MG tablet Commonly known as: ZYPREXA Take 5-15 mg by mouth See admin instructions. Take 5 mg in the morning and 15 mg at night   risperiDONE 1 MG tablet Commonly known as: RISPERDAL Take 1 tablet (1 mg total) by mouth at bedtime.   sertraline 100 MG tablet Commonly known as: ZOLOFT Take 100 mg by mouth daily.   sodium bicarbonate 650 MG tablet Take 650 mg by mouth 2 (two) times daily.        Outstanding Labs/Studies   BMET at follow up  Duration of Discharge Encounter   Greater than 30 minutes including physician time.  SignedGeorgie Chard, NP 07/16/2022, 12:26 PM [phone]

## 2022-07-16 NOTE — Progress Notes (Signed)
The patient is brought in for transcatheter edge-to-edge repair of the mitral valve.  Please see recent office note for details.  The patient is found to have severe ischemic mitral regurgitation.  He is induced under general anesthesia.  Transesophageal echo was performed.  Even with a systolic blood pressure as high as 150 mmHg, the patient has no more than 1+ mitral regurgitation.  His images are very different than what we had seen previously.  I suspect with his recent hospital admission, medical therapy, and diuresis, he has had marked reduction in mitral regurgitation.  The MitraClip procedure is therefore deferred.  The patient will be taken to the recovery area after he wakes up from anesthesia and will be discharged home today with close outpatient follow-up.  Glenn Hawkins 07/16/2022 11:21 AM

## 2022-07-17 ENCOUNTER — Encounter (HOSPITAL_COMMUNITY): Payer: Medicare Other

## 2022-07-17 ENCOUNTER — Encounter (HOSPITAL_COMMUNITY): Payer: Self-pay | Admitting: Cardiovascular Disease

## 2022-07-17 MED FILL — Fentanyl Citrate Preservative Free (PF) Inj 100 MCG/2ML: INTRAMUSCULAR | Qty: 2 | Status: AC

## 2022-07-17 NOTE — Anesthesia Postprocedure Evaluation (Signed)
Anesthesia Post Note  Patient: Glenn Hawkins  Procedure(s) Performed: MITRAL VALVE REPAIR TRANSESOPHAGEAL ECHOCARDIOGRAM     Patient location during evaluation: Cath Lab Anesthesia Type: General Level of consciousness: awake and alert Pain management: pain level controlled Vital Signs Assessment: post-procedure vital signs reviewed and stable Respiratory status: spontaneous breathing, nonlabored ventilation and respiratory function stable Cardiovascular status: blood pressure returned to baseline and stable Postop Assessment: no apparent nausea or vomiting Anesthetic complications: no   There were no known notable events for this encounter.  Last Vitals:  Vitals:   07/16/22 1250 07/16/22 1305  BP: (!) 153/89 (!) 154/90  Pulse:    Resp:    Temp:    SpO2: 95% 98%    Last Pain:  Vitals:   07/16/22 1230  TempSrc:   PainSc: 0-No pain                 Tytiana Coles

## 2022-07-18 ENCOUNTER — Ambulatory Visit: Payer: Self-pay | Admitting: *Deleted

## 2022-07-18 ENCOUNTER — Encounter: Payer: Self-pay | Admitting: *Deleted

## 2022-07-22 DIAGNOSIS — N184 Chronic kidney disease, stage 4 (severe): Secondary | ICD-10-CM | POA: Diagnosis not present

## 2022-07-23 ENCOUNTER — Ambulatory Visit: Payer: Medicare Other

## 2022-07-25 ENCOUNTER — Other Ambulatory Visit: Payer: Self-pay

## 2022-07-25 MED ORDER — ISOSORBIDE MONONITRATE ER 30 MG PO TB24: 30.0000 mg | ORAL_TABLET | Freq: Every morning | ORAL | 3 refills | Status: DC

## 2022-07-25 NOTE — Patient Outreach (Signed)
Care Coordination   Initial Visit Note   07/18/2022 Name: Glenn Hawkins MRN: [id number] DOB: 03-11-1955  Glenn Hawkins is a 67 y.o. year old male who sees Vyas, Dhruv B, MD for primary care. I spoke with  [name] by phone today.  What matters to the patients health and wellness today?  Managing CHF and blood pressure and increasing independence   Goals Addressed             This Visit's Progress    Increasing Independence s/p Hosp Discharge       Care Coordination Goals: Patient will take medication as prescribed Receives meds through [place] at no cost Sister is managing his medications. He was managing them independently prior to hospital admission Consider physical therapy Schedule hospital f/u with PCP Talk with PCP about physical therapy. Will need an order from face-to-face visit Move carefully and change positions slowly to avoid falls Use cane for ambulation as needed Consider pharmacy referral to review medications [facility] Call RN Care Coordinator [phone] with any resource or care coordination needs     Manage CHF and Monitor for Edema       Care Coordination Goals: Patient will take medications as directed Patient will keep all medical appointments Patient will notify provider of any new or worsening symptoms Patient will check and record blood pressure daily and reach out to provider with any readings outside of recommended range Patient will check and record weight each morning after urinating and will call cardiologist with weight gain of >2 lbs overnight of >5 lbs in one week Patient will follow a low sodium heart healthy diet Patient will monitor for any swelling in lower extremities, hands, abd Patient will call RN Care Coordinator [phone] with any resource or care coordination needs         SDOH assessments and interventions completed:  Yes  SDOH Interventions Today    Flowsheet Row Most Recent Value   SDOH Interventions   Food Insecurity Interventions Intervention Not Indicated  Housing Interventions Intervention Not Indicated  Transportation Interventions Intervention Not Indicated  Financial Strain Interventions Intervention Not Indicated  Physical Activity Interventions Intervention Not Indicated        Care Coordination Interventions:  Yes, provided  Interventions Today    Flowsheet Row Most Recent Value  General Interventions   General Interventions Discussed/Reviewed General Interventions Discussed, General Interventions Reviewed, Durable Medical Equipment (DME), Doctor Visits, Labs, Communication with  [hospitalization notes]  Doctor Visits Discussed/Reviewed Doctor Visits Discussed, Specialist, Doctor Visits Reviewed, PCP  Durable Medical Equipment (DME) BP Cuff, Other  [scales, cane]  PCP/Specialist Visits Compliance with follow-up visit  Communication with PCP/Specialists  [PCP, Dr Sherril Croon, re: rescheuduling OV and ordering PT]  Exercise Interventions   Exercise Discussed/Reviewed Physical Activity  Physical Activity Discussed/Reviewed Physical Activity Discussed, Physical Activity Reviewed  Education Interventions   Education Provided Provided Education  Provided Verbal Education On Labs, When to see the doctor, Mental Health/Coping with Illness, Medication  [blood pressure monitoring, daily weights. Sister is managing meds. [facility]]  Nutrition Interventions   Nutrition Discussed/Reviewed Nutrition Discussed, Nutrition Reviewed, Decreasing salt  Pharmacy Interventions   Pharmacy Dicussed/Reviewed Pharmacy Topics Discussed, Pharmacy Topics Reviewed, Medications and their functions  [meds covered and sent through [place]. May need pharmacy referral to discuss medications. most recent is Eliquis]  Safety Interventions   Safety Discussed/Reviewed Safety Discussed, Safety Reviewed, Fall Risk, Home Safety  Home Safety Assistive Devices  Follow  up plan: Follow up call scheduled for 07/30/22    Encounter Outcome:  Pt. Visit Completed   Demetrios Loll, BSN, RN-BC RN Care Coordinator [facility]  [facility] Direct Dial: [phone] Main #: [phone]

## 2022-07-28 NOTE — Progress Notes (Incomplete)
ADVANCED HF CLINIC CONSULT NOTE  Primary Care: [name], MD Primary Cardiologist: [name], MD Nephrologist: Dr. [name] HF Cardiologist: Dr. [name]  HPI: 67 y.o. with history of chronic systolic CHF/iCM, CKD IV, severe MR, hx DVT on anticoagulation, schizophrenia, iron deficiency anemia.    Had late presentation inferior MI in 2013. He was managed medically d/t CKD. Subsequent stress myoview in 02/2011 showed large transmural infarct in LCX territory, EF 45%.    Echo (7/21): EF 30-35%.    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   Admitted to [facility] (3/24) d/t respiratory failure and multifocal PNA.   Admitted to [facility] (3/24) with a/c CHF. He was diuresed with IV lasix and transitioned to po lasix 40 mg daily. Echo showed EF 35-40%, RV mildly reduced, severe LAE, severe MR. He was referred for outpatient f/u with structural team for consideration of MitraClip.   TEE: EF 35-40%, RV mildly reduced, severe ischemic MR. He was seen by Dr. [name] on 06/05/22 and felt to be potential candidate for mTEER.   Seen in ED at [facility] 4/28 with a/c CHF. Given IV lasix and discharged home.   Presented for [facility] 07/03/22 as part of workup for mTEER. R/LHC showed subtotal occlusion m LCX and m RCA, RCA collateralized by LAD; RA mean 20, PA mean 50, PCWP 30 with V waves to 40, LVEDP 29, CO/CI 6.57/3.21. He was admitted for IV diuresis/optimization and evaluation by heart failure team for consideration of mTEER. Diuresed with IV lasix, down 15 lbs. GDMT titrated but limited by AKI. Received feraheme during admission for IDA. He was discharged home, weight 185 lbs, with plans for mTEER when HF stabilized.  Presented for scheduled mTEER 07/16/22, however at start of case the TEE showed no more than 1+ MR. Felt that optimization of HF resulted in marked reduction in MR. Case cancelled and he was discharged home.    Today he returns for post hospital HF  follow up with his neice. Overall feeling fine. He has mild SOB with weed eating, he has SOB walking further distances on flat ground. Feels fuller in abdomen, no LE swelling. Denies palpitations, CP, dizziness, abnormal bleeding or PND/Orthopnea. Appetite ok. No fever or chills. Weight at home 190 pounds. Taking all medications, has been taking Lasix 40 tid. No tobacco/ETOH/drug. Lives with his wife, he drives. Niece says he does not fill his pill box correctly.   Cardiac Studies - TEE (5/24) start of case for mTEER: LVEF 40-45%, moderate concentric LVH, severe HK of LV, entire inferolateral wall, normal RV, mild MR 1+  - R/LHC (5/24): subtotal occlusion m LCX and m RCA, RCA collateralized by LAD;  RA mean 20, PA mean 50, PCWP 30 with V waves to 40, LVEDP 29, CO/CI 6.57/3.21.   TEE (4/24): EF 35-40%, RV mildly reduced, severe ischemic MR.  - Echo (3/24): EF 35-40%, RV mildly reduced, severe LAE, severe MR.  Review of Systems: [y] = yes, [ ]  = no   General: Weight gain [ ] ; Weight loss [ ] ; Anorexia [ ] ; Fatigue [ ] ; Fever [ ] ; Chills [ ] ; Weakness [ ]   Cardiac: Chest pain/pressure [ ] ; Resting SOB [ ] ; Exertional SOB Cove.Etienne ]; Orthopnea [ ] ; Pedal Edema Cove.Etienne ]; Palpitations [ ] ; Syncope [ ] ; Presyncope [ ] ; Paroxysmal nocturnal dyspnea[ ]   Pulmonary: Cough [ ] ; Wheezing[ ] ; Hemoptysis[ ] ; Sputum [ ] ; Snoring [ ]   GI: Vomiting[ ] ; Dysphagia[ ] ; Melena[ ] ; Hematochezia [ ] ;  Heartburn[ ] ; Abdominal pain [ ] ; Constipation [ ] ; Diarrhea [ ] ; BRBPR [ ]   GU: Hematuria[ ] ; Dysuria [ ] ; Nocturia[ ]   Vascular: Pain in legs with walking [ ] ; Pain in feet with lying flat [ ] ; Non-healing sores [ ] ; Stroke [ ] ; TIA [ ] ; Slurred speech [ ] ;  Neuro: Headaches[ ] ; Vertigo[ ] ; Seizures[ ] ; Paresthesias[ ] ;Blurred vision [ ] ; Diplopia [ ] ; Vision changes [ ]   Ortho/Skin: Arthritis [ ] ; Joint pain [ ] ; Muscle pain [ ] ; Joint swelling [ ] ; Back Pain [ ] ; Rash [ ]   Psych: Depression[ ] ; Anxiety[ ]   Heme: Bleeding  problems [ ] ; Clotting disorders [ ] ; Anemia Cove.Etienne ]  Endocrine: Diabetes [ ] ; Thyroid dysfunction[ ]   Past Medical History:  Diagnosis Date   Aortic insufficiency    a. 08/2019 Echo: mild to mod AI.   C. 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 02/2011 Late presentation MI-->Myoview w/ large area of transmural infarct in LCX distribution, no ischemia.   Depressive disorder, not elsewhere classified    Dyspnea    Esophageal reflux    Gout, unspecified    Heart murmur    Mitral valve   Hemorrhoids    a. 01/2016 s/p hemorrhoidectomy.   HFrEF (heart failure with reduced ejection fraction) (HCC)    a. 04/2012 Echo: EF 40%, inflat AK, Gr1 DD, mild AI/MR, triv TR; b. 11/2017 EchoP EF 30-35%, inflat HK; c. 08/2019 Echo: EF 30-35%, gr1 DD, inflat AK.   History of DVT (deep vein thrombosis)    a. Chronic Eliquis.   Hyperlipidemia LDL goal <70    Hypertension    Hypotension, unspecified    Ischemic cardiomyopathy    a. 04/2012 Echo: EF 40%, inflat AK, Gr1 DD, mild AI/MR, triv TR; b. 11/2017 EchoP EF 30-35%, inflat HK; c. 08/2019 Echo: EF 30-35%, gr1 DD, inflat AK. Nl RV size/fxn. Mild MR. Mild to mod AI.   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 04/2012 Echo: EF 40%, mild MR.   Myocardial infarction (lateral wall) (HCC) 2013   a. 02/2011 - late presentation. Managed conservatively 2/2 CKD;  b. 02/2011 Myoview: Large transmural infarct in the LCX distribution, no ischemia.   PAD (peripheral artery disease) (HCC)    a. 08/2015 ABI/duplex: R: 0.86, L 0.96. Duplex w/ bilateral heterogeneous plaque in mid fem arteries, no significant stenoses; b. 08/2019 ABI/Duplex: prob bilat inflow dzs w/ stable ABIs (R 0.85, L 0.93).   Personal history of tobacco use, presenting hazards to health    Torn ligament     Unspecified schizophrenia, unspecified condition    Current Outpatient Medications  Medication Sig Dispense Refill   apixaban (ELIQUIS) 5 MG TABS tablet Take 2.5 mg by mouth 2 (two) times daily.     aspirin EC 81 MG tablet Take 81 mg by mouth daily. Swallow whole.     atorvastatin (LIPITOR) 80 MG tablet Take 1 tablet (80 mg total) by mouth daily. 30 tablet 3   brimonidine (ALPHAGAN) 0.2 % ophthalmic solution Place 1 drop into both eyes daily.     calcitRIOL (ROCALTROL) 0.25 MCG capsule Take 0.25 mcg by mouth every Monday, Wednesday, and Friday.     colchicine 0.6 MG tablet Take 0.5 tablets (0.3 mg total) by mouth daily.  30 tablet 3   Daridorexant HCl 25 MG TABS Take 25 mg by mouth at bedtime.     DOCUSATE SODIUM PO Take 1 capsule by mouth daily.     esomeprazole (NEXIUM) 20 MG capsule Take 1 capsule (20 mg total) by mouth daily before breakfast. 30 capsule 3   furosemide (LASIX) 40 MG tablet Take 1 tablet (40 mg total) by mouth 2 (two) times daily. (Patient taking differently: Take 40 mg by mouth in the morning, at noon, and at bedtime.) 180 tablet 3   hydrALAZINE (APRESOLINE) 50 MG tablet Take 1.5 tablets (75 mg total) by mouth 3 (three) times daily. 135 tablet 6   isosorbide mononitrate (IMDUR) 30 MG 24 hr tablet Take 1 tablet (30 mg total) by mouth every morning. 90 tablet 3   latanoprost (XALATAN) 0.005 % ophthalmic solution Place 1 drop into both eyes at bedtime.     melatonin 3 MG TABS tablet Take 3 mg by mouth at bedtime.     metoprolol succinate (TOPROL-XL) 100 MG 24 hr tablet Take 1 tablet (100 mg total) by mouth daily. Take with or immediately following a meal. 30 tablet 6   nitroGLYCERIN (NITROSTAT) 0.4 MG SL tablet Dissolve 1 tablet under the tongue every 5 minutes as needed for chest pain. Max of 3 doses, then 911. 25 tablet 6   OLANZapine (ZYPREXA) 10 MG tablet Take 5-15 mg by mouth See admin instructions. Take 5 mg in the morning and 15 mg at night     risperiDONE (RISPERDAL) 1 MG  tablet Take 1 tablet (1 mg total) by mouth at bedtime. 30 tablet 0   sertraline (ZOLOFT) 100 MG tablet Take 100 mg by mouth daily.     sodium bicarbonate 650 MG tablet Take 650 mg by mouth 2 (two) times daily.     No current facility-administered medications for this encounter.   Allergies  Allergen Reactions   Nifedipine Diarrhea   Aciphex [Rabeprazole Sodium] Diarrhea   Benazepril Other (See Comments)    Unknown   Haloperidol Anxiety    Causes severe anxiety   Prilosec [Omeprazole] Diarrhea   Social History   Socioeconomic History   Marital status: Divorced    Spouse name: Not on file   Number of children: Not on file   Years of education: Not on file   Highest education level: Not on file  Occupational History   Not on file  Tobacco Use   Smoking status: Former    Packs/day: 1.00    Years: 23.00    Additional pack years: 0.00    Total pack years: 23.00    Types: Cigarettes    Quit date: 02/25/1996    Years since quitting: 26.4    Passive exposure: Never   Smokeless tobacco: Never   Tobacco comments:    + 15 years of smoking  Vaping Use   Vaping Use: Never used  Substance and Sexual Activity   Alcohol use: No    Comment: Daily alcohol use 20+ years ago   Drug use: Not Currently    Types: Cocaine    Comment: History of intranasal cocaine and speed 20+ years ago.   Sexual activity: Not Currently    Birth control/protection: None  Other Topics Concern   Not on file  Social History Narrative   Not on file   Social Determinants of Health   Financial Resource Strain: Low Risk  (07/18/2022)   Overall Financial Resource Strain (CARDIA)    Difficulty of Paying Living  Expenses: Not very hard  Food Insecurity: No Food Insecurity (07/18/2022)   Hunger Vital Sign    Worried About Running Out of Food in the Last Year: Never true    Ran Out of Food in the Last Year: Never true  Transportation Needs: No Transportation Needs (07/18/2022)   PRAPARE - Doctor, general practice (Medical): No    Lack of Transportation (Non-Medical): No  Physical Activity: Inactive (07/18/2022)   Exercise Vital Sign    Days of Exercise per Week: 0 days    Minutes of Exercise per Session: 0 min  Stress: Not on file  Social Connections: Not on file  Intimate Partner Violence: Not At Risk (07/09/2022)   Humiliation, Afraid, Rape, and Kick questionnaire    Fear of Current or Ex-Partner: No    Emotionally Abused: No    Physically Abused: No    Sexually Abused: No   Family History  Problem Relation Age of Onset   Crohn's disease Sister    Colon cancer Neg Hx    BP (!) 140/78   Pulse 87   Wt 87.7 kg (193 lb 6.4 oz)   SpO2 96%   BMI 28.56 kg/m   Wt Readings from Last 3 Encounters:  07/29/22 87.7 kg (193 lb 6.4 oz)  07/16/22 83.9 kg (185 lb)  07/14/22 86.5 kg (190 lb 9.6 oz)   PHYSICAL EXAM: General:  NAD. No resp difficulty HEENT: Normal Neck: Supple. No JVD. Carotids 2+ bilat; no bruits. No lymphadenopathy or thryomegaly appreciated. Cor: PMI nondisplaced. Regular rate & rhythm. No rubs, gallops or murmurs. Lungs: Clear Abdomen: Soft, nontender, nondistended. No hepatosplenomegaly. No bruits or masses. Good bowel sounds. Extremities: No cyanosis, clubbing, rash, edema Neuro: Alert & oriented x 3, cranial nerves grossly intact. Moves all 4 extremities w/o difficulty. Affect pleasant.  ECG (personally reviewed): NSR + LVH 86 bpm  REDs: 38%  ASSESSMENT & PLAN: Chronic systolic CHF: - iCM - Prior inferior MI in 2013. EF has been reduced since then - Echo (7/21): EF 30-35% - Echo (3/24): EF 35-40%, RV mildly reduced, severe LAE, severe MR. - TEE (4/24): EF 35-40%, RV mildly reduced, severe ischemic MR - R/LHC (5/24): subtotal occlusion m LCX and m RCA, RCA collateralized by LAD, RA mean 20, PA mean 50, PCWP 30 with V waves to 40, LVEDP 29, CO/CI 6.57/3.21.  - NYHA II-early III today, volume up a bit today, ReDs 38%. GDMT limited by CKD. - Start  Jardiance 10 mg daily. Discussed potential side effect profile, will reach out to his Nephrologist as well. - Continue Lasix 40 mg bid for now. - Continue hydralazine 75 mg tid + Imdur 30 mg daily for afterload reduction. - Continue Toprol XL 100 mg daily - BMET and BNP today.  2. Severe MR: - Severe by previous TEE - Seen by Structural Heart Team - Planned for mTEER, but TEE at start of case showed 1+ MR and procedure cancelled.   3. CAD: - Hx inferior MI in 2013 - LHC (5/24): Subtotal occlusion m LCX and m RCA, RCA collateralized by LAD, LAD okay - No chest pain. - Continue ASA - Continue atorvastatin 80 mg daily (switched from Crestor due to renal function).   4. CKD IV: - Baseline seems to be 2.8-3.2 - Followed by [facility] - Plan to add SGLT2i as above - Labs today.   5. Iron deficiency anemia: - Received Feraheme.   6. Hx DVT: - Continue Eliquis 2.5 mg bid.  -  No bleeding issues.  7. Schizophrenia - Appears stable on risperidone and olanzapine  8. SDOH - Several medication discrepancies, niece confirms. - Discussed paramedicine, he is agreeable.  Follow up in 4 weeks with APP and 3 months with Dr. [name].  Prince Rome, FNP-BC 07/29/22

## 2022-07-29 ENCOUNTER — Encounter (HOSPITAL_COMMUNITY): Payer: Self-pay

## 2022-07-29 ENCOUNTER — Telehealth (HOSPITAL_COMMUNITY): Payer: Self-pay

## 2022-07-29 ENCOUNTER — Other Ambulatory Visit (HOSPITAL_COMMUNITY): Payer: Self-pay

## 2022-07-29 ENCOUNTER — Ambulatory Visit (HOSPITAL_COMMUNITY)
Admit: 2022-07-29 | Discharge: 2022-07-29 | Disposition: A | Discharge status: Home / Self Care | Payer: Medicare Other | Source: Ambulatory Visit | Attending: Family Medicine | Admitting: Family Medicine

## 2022-07-29 ENCOUNTER — Other Ambulatory Visit: Payer: Self-pay | Admitting: *Deleted

## 2022-07-29 VITALS — BP 140/78 | HR 87 | Wt 193.4 lb

## 2022-07-29 DIAGNOSIS — Z139 Encounter for screening, unspecified: Secondary | ICD-10-CM | POA: Diagnosis not present

## 2022-07-29 DIAGNOSIS — N184 Chronic kidney disease, stage 4 (severe): Secondary | ICD-10-CM

## 2022-07-29 DIAGNOSIS — Z7982 Long term (current) use of aspirin: Secondary | ICD-10-CM | POA: Diagnosis not present

## 2022-07-29 DIAGNOSIS — Z86718 Personal history of other venous thrombosis and embolism: Secondary | ICD-10-CM | POA: Diagnosis not present

## 2022-07-29 DIAGNOSIS — F209 Schizophrenia, unspecified: Secondary | ICD-10-CM | POA: Diagnosis not present

## 2022-07-29 DIAGNOSIS — I252 Old myocardial infarction: Secondary | ICD-10-CM | POA: Insufficient documentation

## 2022-07-29 DIAGNOSIS — D631 Anemia in chronic kidney disease: Secondary | ICD-10-CM | POA: Diagnosis not present

## 2022-07-29 DIAGNOSIS — I5042 Chronic combined systolic (congestive) and diastolic (congestive) heart failure: Secondary | ICD-10-CM | POA: Diagnosis not present

## 2022-07-29 DIAGNOSIS — D508 Other iron deficiency anemias: Secondary | ICD-10-CM

## 2022-07-29 DIAGNOSIS — I130 Hypertensive heart and chronic kidney disease with heart failure and stage 1 through stage 4 chronic kidney disease, or unspecified chronic kidney disease: Secondary | ICD-10-CM | POA: Insufficient documentation

## 2022-07-29 DIAGNOSIS — I2510 Atherosclerotic heart disease of native coronary artery without angina pectoris: Secondary | ICD-10-CM | POA: Diagnosis not present

## 2022-07-29 DIAGNOSIS — R0602 Shortness of breath: Secondary | ICD-10-CM | POA: Diagnosis not present

## 2022-07-29 DIAGNOSIS — Z79899 Other long term (current) drug therapy: Secondary | ICD-10-CM | POA: Insufficient documentation

## 2022-07-29 DIAGNOSIS — Z7901 Long term (current) use of anticoagulants: Secondary | ICD-10-CM | POA: Insufficient documentation

## 2022-07-29 DIAGNOSIS — I5022 Chronic systolic (congestive) heart failure: Secondary | ICD-10-CM | POA: Diagnosis not present

## 2022-07-29 DIAGNOSIS — I340 Nonrheumatic mitral (valve) insufficiency: Secondary | ICD-10-CM | POA: Diagnosis not present

## 2022-07-29 LAB — BASIC METABOLIC PANEL
Anion gap: 8 (ref 5–15)
BUN: 32 mg/dL — ABNORMAL HIGH (ref 8–23)
CO2: 26 mmol/L (ref 22–32)
Calcium: 8.6 mg/dL — ABNORMAL LOW (ref 8.9–10.3)
Chloride: 106 mmol/L (ref 98–111)
Creatinine, Ser: 3.24 mg/dL — ABNORMAL HIGH (ref 0.61–1.24)
GFR, Estimated: 20 mL/min — ABNORMAL LOW (ref >60)
Glucose, Bld: 118 mg/dL — ABNORMAL HIGH (ref 70–99)
Potassium: 2.9 mmol/L — ABNORMAL LOW (ref 3.5–5.1)
Sodium: 140 mmol/L (ref 135–145)

## 2022-07-29 LAB — BRAIN NATRIURETIC PEPTIDE: B Natriuretic Peptide: 505.1 pg/mL — ABNORMAL HIGH (ref 0.0–100.0)

## 2022-07-29 MED ORDER — ISOSORBIDE MONONITRATE ER 30 MG PO TB24: 30.0000 mg | ORAL_TABLET | Freq: Every morning | ORAL | 3 refills | Status: DC

## 2022-07-29 MED ORDER — POTASSIUM CHLORIDE CRYS ER 20 MEQ PO TBCR: 40.0000 meq | EXTENDED_RELEASE_TABLET | Freq: Every day | ORAL | 3 refills | Status: DC

## 2022-07-29 MED ORDER — EMPAGLIFLOZIN 10 MG PO TABS: 10.0000 mg | ORAL_TABLET | Freq: Every day | ORAL | 6 refills | Status: DC

## 2022-07-29 NOTE — Telephone Encounter (Addendum)
Pt aware, agreeable, and verbalized understanding  Rx sent. Has a follow up on Friday and will get labs drawn there.  ----- Message from Jacklynn Ganong, FNP sent at 07/29/2022  2:44 PM EDT ----- BNP elevated, Jardiance started today so this will help.  K is very low. Take 80 KCL x 1 today, then start 40 KCL daily thereafter, needs repeat BMET and Mag on Friday.

## 2022-07-29 NOTE — Patient Instructions (Addendum)
Thank you for coming in today  If you had labs drawn today, any labs that are abnormal the clinic will call you No news is good news   Medications: START Jardiance 10 mg 1 tablet daily   Follow up appointments:  Your physician recommends that you schedule a follow-up appointment in:  4 weeks in clinic 3 months With Dr. [name]     Do the following things EVERYDAY: Weigh yourself in the morning before breakfast. Write it down and keep it in a log. Take your medicines as prescribed Eat low salt foods--Limit salt (sodium) to 2000 mg per day.  Stay as active as you can everyday Limit all fluids for the day to less than 2 liters   At the [facility], you and your health needs are our priority. As part of our continuing mission to provide you with exceptional heart care, we have created designated Provider Care Teams. These Care Teams include your primary Cardiologist (physician) and Advanced Practice Providers (APPs- Physician Assistants and Nurse Practitioners) who all work together to provide you with the care you need, when you need it.   You may see any of the following providers on your designated Care Team at your next follow up: Dr Arvilla Meres Dr Marca Ancona Dr. Marcos Eke, NP Robbie Lis, [place] [facility] [place], [place] Brynda Peon, NP Karle Plumber, PharmD   Please be sure to bring in all your medications bottles to every appointment.    Thank you for choosing [facility]  If you have any questions or concerns before your next appointment please send [place] a message through [place] or call our office at [phone].    TO LEAVE A MESSAGE FOR THE NURSE SELECT OPTION 2, PLEASE LEAVE A MESSAGE INCLUDING: YOUR NAME DATE OF BIRTH CALL BACK NUMBER REASON FOR CALL**this is important as we prioritize the call backs  YOU WILL RECEIVE A CALL BACK THE SAME DAY AS LONG AS YOU CALL  BEFORE 4:00 PM

## 2022-07-29 NOTE — Progress Notes (Signed)
Heart and Vascular Care Navigation  07/29/2022  SLAYTER DATA [birth year] [id number]  Reason for Referral: CSW referred to assist with referral to [facility]. Patient is participating in a Managed Medicaid Plan:No  Engaged with patient face to face for initial visit for Heart and Vascular Care Coordination.                                                                                                   Assessment:  Patient is a 67 yo male who lives alone in a single family home. Patient came to clinic today with his niece who reports strong family support. Patient has two sisters, two daughters and many other family members who provide love and support to patient. She shared that he does have multiple locks on the front door "to keep me safe" although each lock has a different key and unclear if patient's family has a copy of the keys to access home if needed in an emergency. Patient stated he will provide necessary keys to family for emergency needs. Niece reports [facility] and denies any concerns with obtaining his meds. He denies any concerns with [facility] needs at this time. Niece shared concerns of medication compliance and would like to explore support for management of his CHF through the CP program.                                    HRT/VAS Care Coordination     Patients Home Cardiology Office Heart Failure Clinic   Outpatient Care Team Social Worker   Social Worker Name: Lasandra Beech, [place] [phone]   Living arrangements for the past 2 months Single Family Home   Lives with: Self   Patient Current Insurance Coverage Traditional Medicare  [facility]   Patient Has Concern With Paying Medical Bills No   Does Patient Have Prescription Coverage? Yes   Home Assistive Devices/Equipment None   Current home services DME  scale       Social History:                                                                             SDOH  Screenings   Food Insecurity: No Food Insecurity (07/18/2022)  Housing: Low Risk  (07/18/2022)  Transportation Needs: No Transportation Needs (07/18/2022)  Utilities: Not At Risk (07/09/2022)  Financial Resource Strain: Low Risk  (07/18/2022)  Physical Activity: Inactive (07/18/2022)  Tobacco Use: Medium Risk (07/29/2022)    SDOH Interventions: Financial Resources:    N/a  Food Insecurity:   N/a  Housing Insecurity:   N/a  Transportation:    N/a   Other Care Navigation Interventions:  Inpatient/Outpatient Substance Abuse Counseling/Rehab Options N/a  Provided Pharmacy assistance resources    Patient expressed Mental Health concerns No.  Patient Referred to: [facility]   Follow-up plan:  CSW discussed CP program and patient and niece agreeable to referral. CSW completed packet and faxed to [facility]. Patient appeared in good spirits and grateful or the added support in the home. CSW available as needed. Lasandra Beech, LCSW, CCSW-MCS [phone]

## 2022-07-29 NOTE — Progress Notes (Signed)
ReDS Vest / Clip - 07/29/22 1029       ReDS Vest / Clip   Station Marker D    Ruler Value 33    ReDS Value Range Moderate volume overload    ReDS Actual Value 38

## 2022-07-30 ENCOUNTER — Encounter: Payer: Medicare Other | Admitting: *Deleted

## 2022-07-30 ENCOUNTER — Telehealth (HOSPITAL_COMMUNITY): Payer: Self-pay | Admitting: Licensed Clinical Social Worker

## 2022-07-30 NOTE — Telephone Encounter (Signed)
CSW received call from Alana from [facility] who reports she plans to go out to see patient this afternoon. She also is requesting a scale as patient shared with her that his scale is now broken. CSW able to provide and paramedic will pick up at clinic and take to patient. CSW available for continued collaboration as needed. Lasandra Beech, LCSW, CCSW-MCS [phone]

## 2022-07-31 DIAGNOSIS — Z09 Encounter for follow-up examination after completed treatment for conditions other than malignant neoplasm: Secondary | ICD-10-CM | POA: Diagnosis not present

## 2022-07-31 DIAGNOSIS — Z299 Encounter for prophylactic measures, unspecified: Secondary | ICD-10-CM | POA: Diagnosis not present

## 2022-07-31 DIAGNOSIS — N184 Chronic kidney disease, stage 4 (severe): Secondary | ICD-10-CM | POA: Diagnosis not present

## 2022-07-31 DIAGNOSIS — F200 Paranoid schizophrenia: Secondary | ICD-10-CM | POA: Diagnosis not present

## 2022-07-31 DIAGNOSIS — I5022 Chronic systolic (congestive) heart failure: Secondary | ICD-10-CM | POA: Diagnosis not present

## 2022-08-05 DIAGNOSIS — N184 Chronic kidney disease, stage 4 (severe): Secondary | ICD-10-CM | POA: Diagnosis not present

## 2022-08-07 ENCOUNTER — Encounter: Payer: Self-pay | Admitting: *Deleted

## 2022-08-07 ENCOUNTER — Telehealth (HOSPITAL_COMMUNITY): Payer: Self-pay | Admitting: Licensed Clinical Social Worker

## 2022-08-07 ENCOUNTER — Ambulatory Visit: Payer: Self-pay | Admitting: *Deleted

## 2022-08-07 NOTE — Patient Outreach (Signed)
Care Coordination   Initial Visit Note   08/07/2022 Name: Glenn Hawkins MRN: [id number] DOB: [birth year]  Glenn Hawkins is a 67 y.o. year old male who sees Vyas, Dhruv B, MD for primary care. I spoke with  Glenn Hawkins by phone today.  What matters to the patients health and wellness today?  Increasing strength and improving balance    Goals Addressed             This Visit's Progress    Increasing Independence s/p Hosp Discharge   On track    Care Coordination Goals: Patient will take medication as prescribed Receives meds through [place] at no cost Sister is managing his medications. He was managing them independently prior to hospital admission Talk with PCP about outpatient physical therapy Has had hosp follow-up visit. PT was not mentioned RNCC to send another secure fax via [facility] to PCP requesting PT services Move carefully and change positions slowly to avoid falls Use cane for ambulation as needed Consider pharmacy referral to review medications [facility] Call RN Care Coordinator [phone] with any resource or care coordination needs     Manage CHF and Monitor for Edema   On track    Care Coordination Goals: Patient will take medications as directed Patient will keep all medical appointments Patient will notify provider of any new or worsening symptoms Patient will check and record blood pressure daily and reach out to provider with any readings outside of recommended range Patient will check and record weight each morning after urinating and will call cardiologist with weight gain of >2 lbs overnight of >5 lbs in one week Patient will follow a low sodium heart healthy diet Patient will monitor for any swelling in lower extremities, hands, abd Patient will call RN Care Coordinator [phone] with any resource or care coordination needs         SDOH assessments and interventions completed:  Yes SDOH Interventions Today     Flowsheet Row Most Recent Value  SDOH Interventions   Transportation Interventions Intervention Not Indicated  Physical Activity Interventions Other (Comments)  [Reaching out to PCP for physical therapy referral]        Care Coordination Interventions:  Yes, provided  Interventions Today    Flowsheet Row Most Recent Value  Chronic Disease   Chronic disease during today's visit Congestive Heart Failure (CHF)  General Interventions   General Interventions Discussed/Reviewed General Interventions Discussed, Doctor Visits, Labs, Communication with, General Interventions Reviewed, Durable Medical Equipment (DME)  Labs Kidney Function  Doctor Visits Discussed/Reviewed Doctor Visits Discussed, Doctor Visits Reviewed, PCP, Specialist  Durable Medical Equipment (DME) BP Cuff, Other  [scales, cane]  PCP/Specialist Visits Compliance with follow-up visit  Communication with PCP/Specialists  [Secure fax via [facility] requesting PT]  Exercise Interventions   Exercise Discussed/Reviewed Physical Activity  Physical Activity Discussed/Reviewed Physical Activity Discussed, Physical Activity Reviewed  Education Interventions   Education Provided Provided Education  Provided Verbal Education On When to see the doctor, Mental Health/Coping with Illness, Exercise, Medication, Labs  [monitoring blood pressure and weight]  Pharmacy Interventions   Pharmacy Dicussed/Reviewed Pharmacy Topics Discussed, Pharmacy Topics Reviewed  Safety Interventions   Safety Discussed/Reviewed Safety Discussed, Safety Reviewed, Fall Risk, Home Safety  Home Safety Assistive Devices       Follow up plan: Follow up call scheduled for 09/05/22    Encounter Outcome:  Pt. Visit Completed   Demetrios Loll, BSN, RN-BC RN Care Coordinator [facility]  [facility]  Network Direct Dial: [phone] Main #: [phone]

## 2022-08-07 NOTE — Telephone Encounter (Signed)
H&V Care Navigation CSW Progress Note  Clinical Social Worker sent in updates to [employer] who continues to follow patient at home   SDOH Screenings   Food Insecurity: No Food Insecurity (07/18/2022)  Housing: Low Risk  (07/18/2022)  Transportation Needs: No Transportation Needs (08/07/2022)  Utilities: Not At Risk (07/09/2022)  Financial Resource Strain: Low Risk  (07/18/2022)  Physical Activity: Inactive (08/07/2022)  Tobacco Use: Medium Risk (08/07/2022)    Burna Sis, LCSW Clinical Social Worker [facility] Desk#: [phone] Cell#: [phone]

## 2022-08-19 DIAGNOSIS — N184 Chronic kidney disease, stage 4 (severe): Secondary | ICD-10-CM | POA: Diagnosis not present

## 2022-08-20 DIAGNOSIS — Z5181 Encounter for therapeutic drug level monitoring: Secondary | ICD-10-CM | POA: Diagnosis not present

## 2022-08-20 DIAGNOSIS — I5042 Chronic combined systolic (congestive) and diastolic (congestive) heart failure: Secondary | ICD-10-CM | POA: Diagnosis not present

## 2022-08-20 DIAGNOSIS — I129 Hypertensive chronic kidney disease with stage 1 through stage 4 chronic kidney disease, or unspecified chronic kidney disease: Secondary | ICD-10-CM | POA: Diagnosis not present

## 2022-08-20 DIAGNOSIS — D631 Anemia in chronic kidney disease: Secondary | ICD-10-CM | POA: Diagnosis not present

## 2022-08-20 DIAGNOSIS — N2581 Secondary hyperparathyroidism of renal origin: Secondary | ICD-10-CM | POA: Diagnosis not present

## 2022-08-20 DIAGNOSIS — E876 Hypokalemia: Secondary | ICD-10-CM | POA: Diagnosis not present

## 2022-08-20 DIAGNOSIS — N184 Chronic kidney disease, stage 4 (severe): Secondary | ICD-10-CM | POA: Diagnosis not present

## 2022-08-30 ENCOUNTER — Encounter (HOSPITAL_COMMUNITY): Payer: Self-pay | Admitting: Internal Medicine

## 2022-08-30 ENCOUNTER — Inpatient Hospital Stay (HOSPITAL_COMMUNITY)
Admission: EM | Admit: 2022-08-30 | Discharge: 2022-09-08 | DRG: 683 | Disposition: A | Discharge status: Skilled Nursing Facility | Payer: Medicare Other | Attending: Internal Medicine | Admitting: Internal Medicine

## 2022-08-30 ENCOUNTER — Inpatient Hospital Stay (HOSPITAL_COMMUNITY): Payer: Medicare Other

## 2022-08-30 ENCOUNTER — Encounter (HOSPITAL_COMMUNITY): Payer: Self-pay

## 2022-08-30 DIAGNOSIS — I5042 Chronic combined systolic (congestive) and diastolic (congestive) heart failure: Secondary | ICD-10-CM | POA: Diagnosis present

## 2022-08-30 DIAGNOSIS — N179 Acute kidney failure, unspecified: Principal | ICD-10-CM | POA: Diagnosis present

## 2022-08-30 DIAGNOSIS — I2510 Atherosclerotic heart disease of native coronary artery without angina pectoris: Secondary | ICD-10-CM | POA: Diagnosis present

## 2022-08-30 DIAGNOSIS — Z7982 Long term (current) use of aspirin: Secondary | ICD-10-CM

## 2022-08-30 DIAGNOSIS — R5383 Other fatigue: Secondary | ICD-10-CM | POA: Diagnosis not present

## 2022-08-30 DIAGNOSIS — E872 Acidosis, unspecified: Secondary | ICD-10-CM | POA: Diagnosis present

## 2022-08-30 DIAGNOSIS — Z86718 Personal history of other venous thrombosis and embolism: Secondary | ICD-10-CM | POA: Diagnosis not present

## 2022-08-30 DIAGNOSIS — M25561 Pain in right knee: Secondary | ICD-10-CM | POA: Diagnosis not present

## 2022-08-30 DIAGNOSIS — Z7984 Long term (current) use of oral hypoglycemic drugs: Secondary | ICD-10-CM

## 2022-08-30 DIAGNOSIS — D509 Iron deficiency anemia, unspecified: Secondary | ICD-10-CM | POA: Diagnosis present

## 2022-08-30 DIAGNOSIS — I130 Hypertensive heart and chronic kidney disease with heart failure and stage 1 through stage 4 chronic kidney disease, or unspecified chronic kidney disease: Secondary | ICD-10-CM | POA: Diagnosis present

## 2022-08-30 DIAGNOSIS — Z751 Person awaiting admission to adequate facility elsewhere: Secondary | ICD-10-CM

## 2022-08-30 DIAGNOSIS — N281 Cyst of kidney, acquired: Secondary | ICD-10-CM | POA: Diagnosis not present

## 2022-08-30 DIAGNOSIS — E8809 Other disorders of plasma-protein metabolism, not elsewhere classified: Secondary | ICD-10-CM | POA: Diagnosis present

## 2022-08-30 DIAGNOSIS — E1151 Type 2 diabetes mellitus with diabetic peripheral angiopathy without gangrene: Secondary | ICD-10-CM | POA: Diagnosis present

## 2022-08-30 DIAGNOSIS — R197 Diarrhea, unspecified: Secondary | ICD-10-CM | POA: Diagnosis present

## 2022-08-30 DIAGNOSIS — Z79899 Other long term (current) drug therapy: Secondary | ICD-10-CM | POA: Diagnosis not present

## 2022-08-30 DIAGNOSIS — J811 Chronic pulmonary edema: Secondary | ICD-10-CM | POA: Diagnosis not present

## 2022-08-30 DIAGNOSIS — E876 Hypokalemia: Secondary | ICD-10-CM | POA: Diagnosis not present

## 2022-08-30 DIAGNOSIS — I5022 Chronic systolic (congestive) heart failure: Secondary | ICD-10-CM | POA: Diagnosis present

## 2022-08-30 DIAGNOSIS — M7989 Other specified soft tissue disorders: Secondary | ICD-10-CM | POA: Diagnosis not present

## 2022-08-30 DIAGNOSIS — I10 Essential (primary) hypertension: Secondary | ICD-10-CM | POA: Diagnosis present

## 2022-08-30 DIAGNOSIS — E1122 Type 2 diabetes mellitus with diabetic chronic kidney disease: Secondary | ICD-10-CM | POA: Diagnosis present

## 2022-08-30 DIAGNOSIS — R9389 Abnormal findings on diagnostic imaging of other specified body structures: Secondary | ICD-10-CM | POA: Diagnosis not present

## 2022-08-30 DIAGNOSIS — I255 Ischemic cardiomyopathy: Secondary | ICD-10-CM | POA: Diagnosis present

## 2022-08-30 DIAGNOSIS — I82411 Acute embolism and thrombosis of right femoral vein: Secondary | ICD-10-CM | POA: Diagnosis present

## 2022-08-30 DIAGNOSIS — M25562 Pain in left knee: Secondary | ICD-10-CM | POA: Diagnosis present

## 2022-08-30 DIAGNOSIS — Z6827 Body mass index (BMI) 27.0-27.9, adult: Secondary | ICD-10-CM

## 2022-08-30 DIAGNOSIS — D631 Anemia in chronic kidney disease: Secondary | ICD-10-CM | POA: Diagnosis present

## 2022-08-30 DIAGNOSIS — N184 Chronic kidney disease, stage 4 (severe): Secondary | ICD-10-CM | POA: Diagnosis present

## 2022-08-30 DIAGNOSIS — E669 Obesity, unspecified: Secondary | ICD-10-CM | POA: Diagnosis present

## 2022-08-30 DIAGNOSIS — D696 Thrombocytopenia, unspecified: Secondary | ICD-10-CM | POA: Diagnosis not present

## 2022-08-30 DIAGNOSIS — E860 Dehydration: Secondary | ICD-10-CM | POA: Diagnosis present

## 2022-08-30 DIAGNOSIS — R0602 Shortness of breath: Secondary | ICD-10-CM | POA: Diagnosis not present

## 2022-08-30 DIAGNOSIS — I509 Heart failure, unspecified: Secondary | ICD-10-CM | POA: Diagnosis not present

## 2022-08-30 DIAGNOSIS — M109 Gout, unspecified: Secondary | ICD-10-CM | POA: Diagnosis present

## 2022-08-30 DIAGNOSIS — R918 Other nonspecific abnormal finding of lung field: Secondary | ICD-10-CM | POA: Diagnosis not present

## 2022-08-30 DIAGNOSIS — F200 Paranoid schizophrenia: Secondary | ICD-10-CM | POA: Diagnosis present

## 2022-08-30 DIAGNOSIS — E785 Hyperlipidemia, unspecified: Secondary | ICD-10-CM | POA: Diagnosis present

## 2022-08-30 DIAGNOSIS — R630 Anorexia: Secondary | ICD-10-CM | POA: Diagnosis not present

## 2022-08-30 DIAGNOSIS — Z87891 Personal history of nicotine dependence: Secondary | ICD-10-CM | POA: Diagnosis not present

## 2022-08-30 DIAGNOSIS — I517 Cardiomegaly: Secondary | ICD-10-CM | POA: Diagnosis not present

## 2022-08-30 DIAGNOSIS — I252 Old myocardial infarction: Secondary | ICD-10-CM

## 2022-08-30 DIAGNOSIS — N189 Chronic kidney disease, unspecified: Secondary | ICD-10-CM | POA: Diagnosis not present

## 2022-08-30 DIAGNOSIS — D649 Anemia, unspecified: Secondary | ICD-10-CM | POA: Diagnosis not present

## 2022-08-30 DIAGNOSIS — Z7901 Long term (current) use of anticoagulants: Secondary | ICD-10-CM

## 2022-08-30 DIAGNOSIS — N139 Obstructive and reflux uropathy, unspecified: Secondary | ICD-10-CM | POA: Diagnosis not present

## 2022-08-30 DIAGNOSIS — R531 Weakness: Secondary | ICD-10-CM | POA: Diagnosis not present

## 2022-08-30 LAB — URINALYSIS, ROUTINE W REFLEX MICROSCOPIC
Bacteria, UA: NONE SEEN
Bilirubin Urine: NEGATIVE
Glucose, UA: NEGATIVE mg/dL
Ketones, ur: NEGATIVE mg/dL
Leukocytes,Ua: NEGATIVE
Nitrite: NEGATIVE
Protein, ur: 100 mg/dL — AB
RBC / HPF: >50 RBC/hpf (ref 0–5)
Specific Gravity, Urine: 1.012 (ref 1.005–1.030)
pH: 5 (ref 5.0–8.0)

## 2022-08-30 LAB — BASIC METABOLIC PANEL
BUN: 114 mg/dL — ABNORMAL HIGH (ref 8–23)
CO2: <7 mmol/L — ABNORMAL LOW (ref 22–32)
Calcium: 7.7 mg/dL — ABNORMAL LOW (ref 8.9–10.3)
Chloride: 117 mmol/L — ABNORMAL HIGH (ref 98–111)
Creatinine, Ser: 9.35 mg/dL — ABNORMAL HIGH (ref 0.61–1.24)
GFR, Estimated: 6 mL/min — ABNORMAL LOW (ref >60)
Glucose, Bld: 97 mg/dL (ref 70–99)
Potassium: 4.9 mmol/L (ref 3.5–5.1)
Sodium: 140 mmol/L (ref 135–145)

## 2022-08-30 LAB — AMMONIA: Ammonia: 60 umol/L — ABNORMAL HIGH (ref 9–35)

## 2022-08-30 LAB — CK: Total CK: 382 U/L (ref 49–397)

## 2022-08-30 LAB — PROTEIN / CREATININE RATIO, URINE
Creatinine, Urine: 175 mg/dL
Protein Creatinine Ratio: 0.74 mg/mg{Cre} — ABNORMAL HIGH (ref 0.00–0.15)
Total Protein, Urine: 130 mg/dL

## 2022-08-30 LAB — SODIUM, URINE, RANDOM: Sodium, Ur: 17 mmol/L

## 2022-08-30 LAB — CREATININE, URINE, RANDOM: Creatinine, Urine: 176 mg/dL

## 2022-08-30 MED ORDER — SODIUM CHLORIDE 0.45 % IV SOLN
INTRAVENOUS | Status: DC
  Filled 2022-08-30 (×2): qty 75

## 2022-08-30 MED ORDER — OLANZAPINE 5 MG PO TABS: 5.0000 mg | ORAL_TABLET | ORAL | Status: DC

## 2022-08-30 MED ORDER — ONDANSETRON HCL 4 MG/2ML IJ SOLN: 4.0000 mg | Freq: Four times a day (QID) | INTRAMUSCULAR | Status: DC | PRN

## 2022-08-30 MED ORDER — ASPIRIN 81 MG PO TBEC
81.0000 mg | DELAYED_RELEASE_TABLET | Freq: Every day | ORAL | Status: DC
  Administered 2022-08-30 – 2022-09-08 (×10): 81 mg via ORAL
  Filled 2022-08-30 (×10): qty 1

## 2022-08-30 MED ORDER — DARIDOREXANT HCL 25 MG PO TABS: 25.0000 mg | ORAL_TABLET | Freq: Every day | ORAL | Status: DC

## 2022-08-30 MED ORDER — RISPERIDONE 1 MG PO TABS
1.0000 mg | ORAL_TABLET | Freq: Every day | ORAL | Status: DC
  Administered 2022-08-30 – 2022-09-07 (×9): 1 mg via ORAL
  Filled 2022-08-30 (×10): qty 1

## 2022-08-30 MED ORDER — ACETAMINOPHEN 325 MG PO TABS
650.0000 mg | ORAL_TABLET | ORAL | Status: DC | PRN
  Administered 2022-09-05: 650 mg via ORAL
  Filled 2022-08-30: qty 2

## 2022-08-30 MED ORDER — OLANZAPINE 5 MG PO TABS
15.0000 mg | ORAL_TABLET | Freq: Every day | ORAL | Status: DC
  Administered 2022-08-30 – 2022-09-07 (×9): 15 mg via ORAL
  Filled 2022-08-30 (×9): qty 3

## 2022-08-30 MED ORDER — SERTRALINE HCL 100 MG PO TABS
100.0000 mg | ORAL_TABLET | Freq: Every day | ORAL | Status: DC
  Administered 2022-08-30 – 2022-09-08 (×10): 100 mg via ORAL
  Filled 2022-08-30 (×10): qty 1

## 2022-08-30 MED ORDER — SODIUM CHLORIDE 0.9 % IV SOLN: INTRAVENOUS | Status: DC

## 2022-08-30 MED ORDER — APIXABAN 2.5 MG PO TABS
2.5000 mg | ORAL_TABLET | Freq: Two times a day (BID) | ORAL | Status: DC
  Administered 2022-08-30 – 2022-09-08 (×18): 2.5 mg via ORAL
  Filled 2022-08-30 (×18): qty 1

## 2022-08-30 MED ORDER — OLANZAPINE 5 MG PO TABS
5.0000 mg | ORAL_TABLET | Freq: Every day | ORAL | Status: DC
  Administered 2022-08-31 – 2022-09-08 (×9): 5 mg via ORAL
  Filled 2022-08-30 (×9): qty 1

## 2022-08-30 MED ORDER — ATORVASTATIN CALCIUM 80 MG PO TABS
80.0000 mg | ORAL_TABLET | Freq: Every day | ORAL | Status: DC
  Administered 2022-08-30 – 2022-09-08 (×10): 80 mg via ORAL
  Filled 2022-08-30 (×10): qty 1

## 2022-08-30 NOTE — Progress Notes (Signed)
Pt arrived to the unit from [facility] alert and oriented. No acute distress noted on room air with VS WNL please refer to flowsheets. Pt skin is intact with no open areas. Pt states lives alone and mentions his sisters as possible support systems. Pt settled and oriented to the room/unit will  continue to monitor

## 2022-08-30 NOTE — H&P (Signed)
History and Physical    Patient: Glenn Hawkins WNU:[id number] DOB: 24-May-1955 DOA: 08/30/2022 DOS: the patient was seen and examined on 08/30/2022 PCP: [name], MD  Patient coming from: Home  Chief Complaint: Generalized weakness since one week.   HPI: Glenn Hawkins is a 67 y.o. male with medical history significant of  stage 4 CKD, ischemic cardiomyopathy, chronic systolic heart failure, extensive DVT in 2016 on low-dose Eliquis, peripheral arterial disease, schizophrenia schizophrenia, hypertension, hyperlipidemia follows up with Dr. [name] presents to [facility] for generalized weakness, fatigue insomnia, loss of appetite, slightly nauseated, mild diarrhea for few days.   Lab work at [facility] showed creatinine of 9.7 and BUN of 114. CT of the abdomen pelvis shows bilateral renal cysts but no obstructive uropathy.  Patient was transferred to [facility] for AKI on stage IV CKD. On further evaluation patient reports that his urine output has decreased in the last 1 week, reports persistent fatigue and generalized weakness.  Patient reports compliance to medications.  He denies any fevers or chills vomiting or abdominal pain or dysuria.  He reports diarrhea is very mild and has resolved.  He denies bloody stools, syncope, dizziness or headache Nephrology consulted on arrival to [facility].  Review of Systems: As mentioned in the history of present illness. All other systems reviewed and are negative. Past Medical History:  Diagnosis Date   Aortic insufficiency    a. 08/2019 Echo: mild to mod AI.   C. 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 02/2011 Late presentation MI-->Myoview w/ large area of transmural infarct in LCX distribution, no ischemia.   Depressive disorder,  not elsewhere classified    Dyspnea    Esophageal reflux    Gout, unspecified    Heart murmur    Mitral valve   Hemorrhoids    a. 01/2016 s/p hemorrhoidectomy.   HFrEF (heart failure with reduced ejection fraction) (HCC)    a. 04/2012 Echo: EF 40%, inflat AK, Gr1 DD, mild AI/MR, triv TR; b. 11/2017 EchoP EF 30-35%, inflat HK; c. 08/2019 Echo: EF 30-35%, gr1 DD, inflat AK.   History of DVT (deep vein thrombosis)    a. Chronic Eliquis.   Hyperlipidemia LDL goal <70    Hypertension    Hypotension, unspecified    Ischemic cardiomyopathy    a. 04/2012 Echo: EF 40%, inflat AK, Gr1 DD, mild AI/MR, triv TR; b. 11/2017 EchoP EF 30-35%, inflat HK; c. 08/2019 Echo: EF 30-35%, gr1 DD, inflat AK. Nl RV size/fxn. Mild MR. Mild to mod AI.   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 04/2012 Echo: EF 40%, mild MR.   Myocardial infarction (lateral wall) (HCC) 2013   a. 02/2011 - late presentation. Managed conservatively 2/2 CKD;  b. 02/2011 Myoview: Large transmural infarct in the LCX distribution, no ischemia.   PAD (peripheral artery disease) (HCC)    a. 08/2015 ABI/duplex: R: 0.86, L 0.96. Duplex w/ bilateral heterogeneous plaque in mid fem arteries, no significant stenoses; b. 08/2019 ABI/Duplex: prob bilat inflow dzs w/ stable ABIs (R 0.85, L 0.93).   Personal history of tobacco use, presenting hazards to health    Torn ligament    Unspecified schizophrenia, unspecified condition    Past Surgical History:  Procedure Laterality Date  BIOPSY  12/17/2020   Procedure: BIOPSY;  Surgeon: Lanelle Bal, DO;  [facility];  Service: Endoscopy;;   COLONOSCOPY     COLONOSCOPY N/A 11/10/2014   Procedure: COLONOSCOPY;  Surgeon: Ruffin Frederick, MD;  Location: [facility] ENDOSCOPY;  Service: Gastroenterology;  Laterality: N/A;   COLONOSCOPY WITH PROPOFOL N/A 05/08/2015   Surgeon: West Bali, MD; nonthrombosed external hemorrhoids, one 8 mm tubular adenoma, one 4 mm tubular adenoma.  Repeat colonoscopy  in 5-10 years.   COLONOSCOPY WITH PROPOFOL N/A 12/17/2020   Procedure: COLONOSCOPY WITH PROPOFOL;  Surgeon: Lanelle Bal, DO;  Location: [facility] END[facility];  Service: Endoscopy;  Laterality: N/A;  8:30am   ESOPHAGOGASTRODUODENOSCOPY (EGD) WITH PROPOFOL N/A 05/08/2015   Surgeon: West Bali, MD; LA grade a reflux esophagitis, normal stomach and duodenum.   ESOPHAGOGASTRODUODENOSCOPY (EGD) WITH PROPOFOL N/A 12/17/2020   Procedure: ESOPHAGOGASTRODUODENOSCOPY (EGD) WITH PROPOFOL;  Surgeon: Lanelle Bal, DO;  Location: [facility];  Service: Endoscopy;  Laterality: N/A;   EYE SURGERY Right    removal of foreign body   HEMORRHOID SURGERY N/A 02/20/2016   Procedure: EXTENSIVE HEMORRHOIDECTOMY;  Surgeon: Franky Macho, MD;  Location: [facility] ORS;  Service: General;  Laterality: N/A;   MITRAL VALVE REPAIR N/A 07/16/2022   Procedure: MITRAL VALVE REPAIR;  Surgeon: Tonny Bollman, MD;  Location: [facility] INVASIVE CV LAB;  Service: Cardiovascular;  Laterality: N/A;   None     POLYPECTOMY  05/08/2015   Procedure: POLYPECTOMY;  Surgeon: West Bali, MD;  Location: [facility];  Service: Endoscopy;;  transverse colon polyp   RIGHT/LEFT HEART CATH AND CORONARY ANGIOGRAPHY N/A 07/03/2022   Procedure: RIGHT/LEFT HEART CATH AND CORONARY ANGIOGRAPHY;  Surgeon: Tonny Bollman, MD;  Location: [facility] INVASIVE CV LAB;  Service: Cardiovascular;  Laterality: N/A;   TEE WITHOUT CARDIOVERSION N/A 05/26/2022   Procedure: TRANSESOPHAGEAL ECHOCARDIOGRAM (TEE);  Surgeon: Wendall Stade, MD;  Location: [facility] ENDOSCOPY;  Service: Cardiovascular;  Laterality: N/A;   TEE WITHOUT CARDIOVERSION N/A 07/16/2022   Procedure: TRANSESOPHAGEAL ECHOCARDIOGRAM;  Surgeon: Tonny Bollman, MD;  Location: [facility] INVASIVE CV LAB;  Service: Cardiovascular;  Laterality: N/A;   Social History:  reports that he quit smoking about 26 years ago. His smoking use included cigarettes. He has a 23.00 pack-year smoking history. He has never been exposed to tobacco  smoke. He has never used smokeless tobacco. He reports that he does not currently use drugs after having used the following drugs: Cocaine. He reports that he does not drink alcohol.  Allergies  Allergen Reactions   Nifedipine Diarrhea   Aciphex [Rabeprazole Sodium] Diarrhea   Benazepril Other (See Comments)    Unknown   Haloperidol Anxiety    Causes severe anxiety   Prilosec [Omeprazole] Diarrhea    Family History  Problem Relation Age of Onset   Crohn's disease Sister    Colon cancer Neg Hx     Prior to Admission medications   Medication Sig Start Date End Date Taking? Authorizing Provider  amLODipine (NORVASC) 10 MG tablet Take 1 tablet by mouth daily. 07/23/22 07/24/23 Yes [provider]  metoprolol tartrate (LOPRESSOR) 100 MG tablet TAKE ONE TABLET BY MOUTH TWICE A DAY FOR HEART 12/30/21 12/31/22 Yes [provider]  spironolactone (ALDACTONE) 50 MG tablet Take 50 mg by mouth daily. 07/19/22  Yes [provider]  apixaban (ELIQUIS) 5 MG TABS tablet Take 2.5 mg by mouth 2 (two) times daily.    [provider]  aspirin EC 81 MG  tablet Take 81 mg by mouth daily. Swallow whole.    [provider]  atorvastatin (LIPITOR) 80 MG tablet Take 1 tablet (80 mg total) by mouth daily. 07/11/22   Laurey Morale, MD  brimonidine (ALPHAGAN) 0.2 % ophthalmic solution Place 1 drop into both eyes daily. 04/18/22   [provider]  calcitRIOL (ROCALTROL) 0.25 MCG capsule Take 0.25 mcg by mouth every Monday, Wednesday, and Friday. 06/13/22 06/13/23  [provider]  colchicine 0.6 MG tablet Take 0.5 tablets (0.3 mg total) by mouth daily. 07/11/22   Laurey Morale, MD  Daridorexant HCl 25 MG TABS Take 25 mg by mouth at bedtime.    [provider]  DOCUSATE SODIUM PO Take 1 capsule by mouth daily.    [provider]  empagliflozin (JARDIANCE) 10 MG TABS tablet Take 1 tablet (10 mg total) by mouth daily before breakfast. 07/29/22    Milford, Anderson Malta, FNP  esomeprazole (NEXIUM) 20 MG capsule Take 1 capsule (20 mg total) by mouth daily before breakfast. 11/08/20   Letta Median, PA-C  furosemide (LASIX) 40 MG tablet Take 1 tablet (40 mg total) by mouth 2 (two) times daily. Patient taking differently: Take 40 mg by mouth in the morning, at noon, and at bedtime. 07/11/22   Laurey Morale, MD  hydrALAZINE (APRESOLINE) 50 MG tablet Take 1.5 tablets (75 mg total) by mouth 3 (three) times daily. 07/10/22   Clegg, Amy D, NP  isosorbide mononitrate (IMDUR) 30 MG 24 hr tablet Take 1 tablet (30 mg total) by mouth every morning. 07/29/22   Iran Ouch, MD  latanoprost (XALATAN) 0.005 % ophthalmic solution Place 1 drop into both eyes at bedtime. 10/11/20   [provider]  melatonin 3 MG TABS tablet Take 3 mg by mouth at bedtime. 04/16/22   [provider]  metoprolol succinate (TOPROL-XL) 100 MG 24 hr tablet Take 1 tablet (100 mg total) by mouth daily. Take with or immediately following a meal. 07/10/22   Clegg, Amy D, NP  nitroGLYCERIN (NITROSTAT) 0.4 MG SL tablet Dissolve 1 tablet under the tongue every 5 minutes as needed for chest pain. Max of 3 doses, then 911. 06/05/22   Tonny Bollman, MD  OLANZapine (ZYPREXA) 10 MG tablet Take 5-15 mg by mouth See admin instructions. Take 5 mg in the morning and 15 mg at night 03/24/22   [provider]  potassium chloride SA (KLOR-CON M) 20 MEQ tablet Take 2 tablets (40 mEq total) by mouth daily. TAKE 4 TABLETS TODAY 06/04 ONLY. 07/29/22   Jacklynn Ganong, FNP  risperiDONE (RISPERDAL) 1 MG tablet Take 1 tablet (1 mg total) by mouth at bedtime. 05/09/22   Zannie Cove, MD  sertraline (ZOLOFT) 100 MG tablet Take 100 mg by mouth daily.    [provider]  sodium bicarbonate 650 MG tablet Take 650 mg by mouth 2 (two) times daily.    [provider]    Physical Exam: Vitals:   08/30/22 1300  BP: (P) 98/64  Pulse: (P) 64  Resp: (P) 18  SpO2: (P)  100%   General exam: Appears calm and comfortable  Respiratory system: Clear to auscultation. Respiratory effort normal. Cardiovascular system: S1 & S2 heard, RRR. No JVD, murmurs, rubs, gallops or clicks. No pedal edema. Gastrointestinal system: Abdomen is nondistended, soft and nontender. No organomegaly or masses felt. Normal bowel sounds heard. Central nervous system: he is lethargic, with slow speech, but appears oriented and grossly  non focal.  Extremities: no pedal edema.  Skin: No rashes,  Psychiatry: slow speech, mood is appropriate.   Data Reviewed: No results found for this or any previous visit (from the past 24 hour(s)).   Assessment and Plan:   Acute kidney injury on stage IV CKD. Baseline creatinine between 3-4 presents to ED with a creatinine of 9.7. Renal ultrasound ordered. Potassium of 3.9. Repeat BMP later this evening. Start the patient on NS at 125 mill per hour and check urine output. Nephrology consulted for further recommendations. Hold diuretics and spironolactone at this time.    History of ischemic cardiomyopathy and chronic systolic heart failure TEE on 8/29 showed left ventricular ejection fraction of 40 to 45% with moderate concentric left ventricular hypertrophy.  With regional wall abnormalities. Continue with Imdur 30 mg daily, metoprolol 100 mg daily.  Holding spironolactone and Lasix for now due to AKI.   History of paranoid schizophrenia Patient is on risperidone and olanzapine at home.  Continue the same and get CK levels.    DVT of right lower extremity Patient is on Eliquis Continue the same.   Essential hypertension Restart home medications slowly   History of coronary artery disease Recent cath showed subtotal occlusion of mLCx and the mRCA with collaterals and severely elevated LVEDP at .  Patient currently denies any chest pain or shortness of breath. Continue ASA, statin, Toprol.    Hyperlipidemia Continue with  statin.   Advance Care Planning:   Code Status: Full Code   Consults: Nephrology  Family Communication: None at bedside  Severity of Illness: The appropriate patient status for this patient is INPATIENT. Inpatient status is judged to be reasonable and necessary in order to provide the required intensity of service to ensure the patient's safety. The patient's presenting symptoms, physical exam findings, and initial radiographic and laboratory data in the context of their chronic comorbidities is felt to place them at high risk for further clinical deterioration. Furthermore, it is not anticipated that the patient will be medically stable for discharge from the hospital within 2 midnights of admission.   * I certify that at the point of admission it is my clinical judgment that the patient will require inpatient hospital care spanning beyond 2 midnights from the point of admission due to high intensity of service, high risk for further deterioration and high frequency of surveillance required.*  Author: Kathlen Mody, MD 08/30/2022 3:24 PM  For on call review [email or web address].

## 2022-08-30 NOTE — Consult Note (Signed)
Reason for Consult: AKI/CKD stage IV Referring Physician: Blake Divine, MD  Glenn Hawkins is an 67 y.o. male with a PMH significant for HTN, DM2, HLD, CAD, chronic systolic CHF/ICMP, severe Mitral regurgitation, schizophrenia, iron deficiency anemia, and CKD Stage IV who presented to [facility] with a 2 week history of fatigue, anorexia, and intermittent diarrhea.  In the ED, temp 97.2, P 67, Bp 115/67, SpO2 99%.  CXR NAD, labs were notable for Co2 13, BUN 114, Cr 9.77, Ca 7.8, alb 3.1, platelets 123, Hgb 12.6, Mg 1.4.  He was transferred to [facility] for admission and we were consulted to further evaluate and manage his AKI/CKD stage IV.  The trend in Scr is seen below.   He is followed by Dr. [name] and is aware of his CKD.  His Scr had been stable at 3.3-4 but started to climb 2 weeks ago.  He denies any N/V/dysgeusia, lower extremity edema, SOB, orthopnea, or PND.  He has noted decreased UOP over the past week but denies any dysuria, pyuria, hematuria, urgency, frequency, or retention.  Trend in Creatinine: Creatinine, Ser  Date/Time Value Ref Range Status  08/30/2022 08:29 AM 9.77 (H) 0.80 - 1.30 mg/dL Final  16/11/9602 54:09 AM 5.24 (H) 0.80 - 1.30 mg/dL Final  81/19/1478 29:56 AM 3.24 (H) 0.61 - 1.24 mg/dL Final  21/30/8657 84:69 AM 3.40 (H) 0.61 - 1.24 mg/dL Final  62/95/2841 32:44 AM 3.32 (H) 0.76 - 1.27 mg/dL Final  02/26/7251 66:44 AM 3.86 (H) 0.61 - 1.24 mg/dL Final  03/47/4259 56:38 AM 4.12 (H) 0.61 - 1.24 mg/dL Final  75/64/3329 51:88 AM 4.05 (H) 0.61 - 1.24 mg/dL Final  41/66/0630 16:01 AM 3.61 (H) 0.61 - 1.24 mg/dL Final  09/32/3557 32:20 AM 3.15 (H) 0.61 - 1.24 mg/dL Final  25/42/7062 37:62 AM 2.97 (H) 0.61 - 1.24 mg/dL Final  83/15/1761 60:73 PM 2.89 (H) 0.61 - 1.24 mg/dL Final  71/07/2692 85:46 AM 2.82 (H) 0.61 - 1.24 mg/dL Final  27/04/5007 38:18 PM 2.86 (H) 0.61 - 1.24 mg/dL Final  29/93/7169 67:89 PM 3.47 (H) 0.76 - 1.27 mg/dL Final  38/11/1749 02:58 PM 3.45 (H) 0.61 - 1.24  mg/dL Final  52/77/8242 35:36 AM 3.24 (H) 0.61 - 1.24 mg/dL Final  14/43/1540 08:67 AM 3.44 (H) 0.61 - 1.24 mg/dL Final  61/95/0932 67:12 AM 3.25 (H) 0.61 - 1.24 mg/dL Final  45/80/9983 38:25 AM 3.27 (H) 0.61 - 1.24 mg/dL Final  05/39/7673 41:93 PM 2.93 (H) 0.76 - 1.27 mg/dL Final  79/03/4095 35:32 AM 2.21 (H) 0.61 - 1.24 mg/dL Final  99/24/2683 41:96 AM 2.63 (H) 0.61 - 1.24 mg/dL Final  22/29/7989 21:19 AM 2.10 (H) 0.61 - 1.24 mg/dL Final  41/74/0814 48:18 PM 2.14 (H) 0.61 - 1.24 mg/dL Final  56/31/4970 26:37 PM 2.78 (H) 0.61 - 1.24 mg/dL Final  85/88/5027 74:12 PM 1.95 (H) 0.50 - 1.35 mg/dL Final    PMH:   Past Medical History:  Diagnosis Date   Aortic insufficiency    a. 08/2019 Echo: mild to mod AI.   C. 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 02/2011 Late presentation MI-->Myoview w/ large area of transmural infarct in LCX distribution, no ischemia.   Depressive disorder, not elsewhere classified    Dyspnea    Esophageal reflux  Gout, unspecified    Heart murmur    Mitral valve   Hemorrhoids    a. 01/2016 s/p hemorrhoidectomy.   HFrEF (heart failure with reduced ejection fraction) (HCC)    a. 04/2012 Echo: EF 40%, inflat AK, Gr1 DD, mild AI/MR, triv TR; b. 11/2017 EchoP EF 30-35%, inflat HK; c. 08/2019 Echo: EF 30-35%, gr1 DD, inflat AK.   History of DVT (deep vein thrombosis)    a. Chronic Eliquis.   Hyperlipidemia LDL goal <70    Hypertension    Hypotension, unspecified    Ischemic cardiomyopathy    a. 04/2012 Echo: EF 40%, inflat AK, Gr1 DD, mild AI/MR, triv TR; b. 11/2017 EchoP EF 30-35%, inflat HK; c. 08/2019 Echo: EF 30-35%, gr1 DD, inflat AK. Nl RV size/fxn. Mild MR. Mild to mod AI.   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 04/2012 Echo: EF 40%, mild MR.    Myocardial infarction (lateral wall) (HCC) 2013   a. 02/2011 - late presentation. Managed conservatively 2/2 CKD;  b. 02/2011 Myoview: Large transmural infarct in the LCX distribution, no ischemia.   PAD (peripheral artery disease) (HCC)    a. 08/2015 ABI/duplex: R: 0.86, L 0.96. Duplex w/ bilateral heterogeneous plaque in mid fem arteries, no significant stenoses; b. 08/2019 ABI/Duplex: prob bilat inflow dzs w/ stable ABIs (R 0.85, L 0.93).   Personal history of tobacco use, presenting hazards to health    Torn ligament    Unspecified schizophrenia, unspecified condition     PSH:   Past Surgical History:  Procedure Laterality Date   BIOPSY  12/17/2020   Procedure: BIOPSY;  Surgeon: Lanelle Bal, DO;  Location: [facility];  Service: Endoscopy;;   COLONOSCOPY     COLONOSCOPY N/A 11/10/2014   Procedure: COLONOSCOPY;  Surgeon: Ruffin Frederick, MD;  Location: [facility] ENDOSCOPY;  Service: Gastroenterology;  Laterality: N/A;   COLONOSCOPY WITH PROPOFOL N/A 05/08/2015   Surgeon: West Bali, MD; nonthrombosed external hemorrhoids, one 8 mm tubular adenoma, one 4 mm tubular adenoma.  Repeat colonoscopy in 5-10 years.   COLONOSCOPY WITH PROPOFOL N/A 12/17/2020   Procedure: COLONOSCOPY WITH PROPOFOL;  Surgeon: Lanelle Bal, DO;  Location: [facility];  Service: Endoscopy;  Laterality: N/A;  8:30am   ESOPHAGOGASTRODUODENOSCOPY (EGD) WITH PROPOFOL N/A 05/08/2015   Surgeon: West Bali, MD; LA grade a reflux esophagitis, normal stomach and duodenum.   ESOPHAGOGASTRODUODENOSCOPY (EGD) WITH PROPOFOL N/A 12/17/2020   Procedure: ESOPHAGOGASTRODUODENOSCOPY (EGD) WITH PROPOFOL;  Surgeon: Lanelle Bal, DO;  Location: [facility];  Service: Endoscopy;  Laterality: N/A;   EYE SURGERY Right    removal of foreign body   HEMORRHOID SURGERY N/A 02/20/2016   Procedure: EXTENSIVE HEMORRHOIDECTOMY;  Surgeon: Franky Macho, MD;  Location: [facility] ORS;  Service: General;  Laterality: N/A;   MITRAL  VALVE REPAIR N/A 07/16/2022   Procedure: MITRAL VALVE REPAIR;  Surgeon: Tonny Bollman, MD;  Location: [facility] INVASIVE CV LAB;  Service: Cardiovascular;  Laterality: N/A;   None     POLYPECTOMY  05/08/2015   Procedure: POLYPECTOMY;  Surgeon: West Bali, MD;  Location: [facility];  Service: Endoscopy;;  transverse colon polyp   RIGHT/LEFT HEART CATH AND CORONARY ANGIOGRAPHY N/A 07/03/2022   Procedure: RIGHT/LEFT HEART CATH AND CORONARY ANGIOGRAPHY;  Surgeon: Tonny Bollman, MD;  Location: [facility] INVASIVE CV LAB;  Service: Cardiovascular;  Laterality: N/A;   TEE WITHOUT CARDIOVERSION N/A 05/26/2022   Procedure: TRANSESOPHAGEAL ECHOCARDIOGRAM (TEE);  Surgeon: Wendall Stade, MD;  Location: [facility]  ENDOSCOPY;  Service: Cardiovascular;  Laterality: N/A;   TEE WITHOUT CARDIOVERSION N/A 07/16/2022   Procedure: TRANSESOPHAGEAL ECHOCARDIOGRAM;  Surgeon: Tonny Bollman, MD;  Location: [facility] INVASIVE CV LAB;  Service: Cardiovascular;  Laterality: N/A;    Allergies:  Allergies  Allergen Reactions   Nifedipine Diarrhea   Aciphex [Rabeprazole Sodium] Diarrhea   Benazepril Other (See Comments)    Unknown   Haloperidol Anxiety    Causes severe anxiety   Prilosec [Omeprazole] Diarrhea    Medications:   Prior to Admission medications   Medication Sig Start Date End Date Taking? Authorizing Provider  amLODipine (NORVASC) 10 MG tablet Take 1 tablet by mouth daily. 07/23/22 07/24/23 Yes [provider]  metoprolol tartrate (LOPRESSOR) 100 MG tablet TAKE ONE TABLET BY MOUTH TWICE A DAY FOR HEART 12/30/21 12/31/22 Yes [provider]  spironolactone (ALDACTONE) 50 MG tablet Take 50 mg by mouth daily. 07/19/22  Yes [provider]  apixaban (ELIQUIS) 5 MG TABS tablet Take 2.5 mg by mouth 2 (two) times daily.    [provider]  aspirin EC 81 MG tablet Take 81 mg by mouth daily. Swallow whole.    [provider]  atorvastatin (LIPITOR) 80 MG tablet Take 1 tablet (80 mg total) by  mouth daily. 07/11/22   Laurey Morale, MD  brimonidine (ALPHAGAN) 0.2 % ophthalmic solution Place 1 drop into both eyes daily. 04/18/22   [provider]  calcitRIOL (ROCALTROL) 0.25 MCG capsule Take 0.25 mcg by mouth every Monday, Wednesday, and Friday. 06/13/22 06/13/23  [provider]  colchicine 0.6 MG tablet Take 0.5 tablets (0.3 mg total) by mouth daily. 07/11/22   Laurey Morale, MD  Daridorexant HCl 25 MG TABS Take 25 mg by mouth at bedtime.    [provider]  DOCUSATE SODIUM PO Take 1 capsule by mouth daily.    [provider]  empagliflozin (JARDIANCE) 10 MG TABS tablet Take 1 tablet (10 mg total) by mouth daily before breakfast. 07/29/22   Milford, Anderson Malta, FNP  esomeprazole (NEXIUM) 20 MG capsule Take 1 capsule (20 mg total) by mouth daily before breakfast. 11/08/20   Letta Median, PA-C  furosemide (LASIX) 40 MG tablet Take 1 tablet (40 mg total) by mouth 2 (two) times daily. Patient taking differently: Take 40 mg by mouth in the morning, at noon, and at bedtime. 07/11/22   Laurey Morale, MD  hydrALAZINE (APRESOLINE) 50 MG tablet Take 1.5 tablets (75 mg total) by mouth 3 (three) times daily. 07/10/22   Clegg, Amy D, NP  isosorbide mononitrate (IMDUR) 30 MG 24 hr tablet Take 1 tablet (30 mg total) by mouth every morning. 07/29/22   Iran Ouch, MD  latanoprost (XALATAN) 0.005 % ophthalmic solution Place 1 drop into both eyes at bedtime. 10/11/20   [provider]  melatonin 3 MG TABS tablet Take 3 mg by mouth at bedtime. 04/16/22   [provider]  metoprolol succinate (TOPROL-XL) 100 MG 24 hr tablet Take 1 tablet (100 mg total) by mouth daily. Take with or immediately following a meal. 07/10/22   Clegg, Amy D, NP  nitroGLYCERIN (NITROSTAT) 0.4 MG SL tablet Dissolve 1 tablet under the tongue every 5 minutes as needed for chest pain. Max of 3 doses, then 911. 06/05/22   Tonny Bollman, MD  OLANZapine (ZYPREXA) 10 MG tablet Take  5-15 mg by mouth See admin instructions. Take 5 mg in the morning and 15 mg at night 03/24/22   [provider]  potassium chloride SA (KLOR-CON M) 20 MEQ tablet Take 2 tablets (40 mEq total) by mouth daily. TAKE 4 TABLETS TODAY 06/04 ONLY. 07/29/22   Jacklynn Ganong, FNP  risperiDONE (RISPERDAL) 1 MG tablet Take 1 tablet (1 mg total) by mouth at bedtime. 05/09/22   Zannie Cove, MD  sertraline (ZOLOFT) 100 MG tablet Take 100 mg by mouth daily.    [provider]  sodium bicarbonate 650 MG tablet Take 650 mg by mouth 2 (two) times daily.    [provider]    Inpatient medications:  apixaban  2.5 mg Oral BID   aspirin EC  81 mg Oral Daily   atorvastatin  80 mg Oral Daily   Daridorexant HCl  25 mg Oral QHS   OLANZapine  5-15 mg Oral See admin instructions   risperiDONE  1 mg Oral QHS   sertraline  100 mg Oral Daily    Discontinued Meds:  There are no discontinued medications.  Social History:  reports that he quit smoking about 26 years ago. His smoking use included cigarettes. He has a 23.00 pack-year smoking history. He has never been exposed to tobacco smoke. He has never used smokeless tobacco. He reports that he does not currently use drugs after having used the following drugs: Cocaine. He reports that he does not drink alcohol.  Family History:   Family History  Problem Relation Age of Onset   Crohn's disease Sister    Colon cancer Neg Hx     Pertinent items are noted in HPI. Weight change:  No intake or output data in the 24 hours ending 08/30/22 1539 BP (P) 98/64 (BP Location: Left Arm)   Pulse (P) 64   Resp (P) 18   SpO2 (P) 100%  Vitals:   08/30/22 1300  BP: (P) 98/64  Pulse: (P) 64  Resp: (P) 18  SpO2: (P) 100%     General appearance: cooperative, fatigued, and no distress Head: Normocephalic, without obvious abnormality, atraumatic Eyes: negative findings: lids and lashes normal, conjunctivae and sclerae normal, and corneas  clear Resp: clear to auscultation bilaterally Cardio: regular rate and rhythm, S1, S2 normal, no murmur, click, rub or gallop GI: soft, non-tender; bowel sounds normal; no masses,  no organomegaly Extremities: extremities normal, atraumatic, no cyanosis or edema  Labs: Basic Metabolic Panel: No results for input(s): "NA", "K", "CL", "CO2", "GLUCOSE", "BUN", "CREATININE", "ALBUMIN", "CALCIUM", "PHOS" in the last 168 hours. Liver Function Tests: No results for input(s): "AST", "ALT", "ALKPHOS", "BILITOT", "PROT", "ALBUMIN" in the last 168 hours. No results for input(s): "LIPASE", "AMYLASE" in the last 168 hours. No results for input(s): "AMMONIA" in the last 168 hours. CBC: No results for input(s): "WBC", "NEUTROABS", "HGB", "HCT", "MCV", "PLT" in the last 168 hours. PT/INR: @LABRCNTIP (inr:5) Cardiac Enzymes: )No results for input(s): "CKTOTAL", "CKMB", "CKMBINDEX", "TROPONINI" in the last 168 hours. CBG: No results for input(s): "GLUCAP" in the last 168 hours.  Iron Studies: No results for input(s): "IRON", "TIBC", "TRANSFERRIN", "FERRITIN" in the last 168 hours.  Xrays/Other Studies: No results found.   Assessment/Plan:  AKI/CKD stage IV - hopefully this is ischemic ATN due to volume depletion due to diarrhea and concomitant SGLT-2 I use, fuorsemide, and spironolactone.  His Jardiance was started last month and may be the culprit, but progressive CKD to stage V is also possible.  Awaiting renal US to r/o obstruction and currently on IVF's.  Will need to decrease the rate given his ICMP.  Will order some urine studies  as well.  Continue to hold furosemide, spironolactone, and Jardiance for now.  Electrolytes are stable, no indication for dialysis at this time.  Hopefully will see some improvement with IVF's and holding possible offending agents.   Avoid nephrotoxic medications including NSAIDs and iodinated intravenous contrast exposure unless the latter is absolutely indicated.   Preferred narcotic agents for pain control are hydromorphone, fentanyl, and methadone. Morphine should not be used. Avoid Baclofen and avoid oral sodium phosphate and magnesium citrate based laxatives / bowel preps. Continue strict Input and Output monitoring. Will monitor the patient closely with you and intervene or adjust therapy as indicated by changes in clinical status/labs   Diarrhea - workup per primary svc ICMP - EF 40-45% and severe MR.  Currently volume depleted but need to follow closely while getting IVF's.  Will decrease the rate to 100 mL/hr for now and follow.  H/o paranoid schizophrenia - continue with home meds HTN - stable DM type 2 - per primary Iron deficiency anemia - Hgb up to 12.6 but may drop with IVF's.    Glenn Hawkins 08/30/2022, 3:39 PM

## 2022-08-31 DIAGNOSIS — I10 Essential (primary) hypertension: Secondary | ICD-10-CM | POA: Diagnosis not present

## 2022-08-31 DIAGNOSIS — N179 Acute kidney failure, unspecified: Secondary | ICD-10-CM | POA: Diagnosis not present

## 2022-08-31 DIAGNOSIS — I82411 Acute embolism and thrombosis of right femoral vein: Secondary | ICD-10-CM

## 2022-08-31 DIAGNOSIS — I5022 Chronic systolic (congestive) heart failure: Secondary | ICD-10-CM

## 2022-08-31 DIAGNOSIS — E785 Hyperlipidemia, unspecified: Secondary | ICD-10-CM | POA: Diagnosis not present

## 2022-08-31 DIAGNOSIS — I2510 Atherosclerotic heart disease of native coronary artery without angina pectoris: Secondary | ICD-10-CM

## 2022-08-31 LAB — CBC WITH DIFFERENTIAL/PLATELET
Abs Immature Granulocytes: 0.02 10*3/uL (ref 0.00–0.07)
Basophils Absolute: 0 10*3/uL (ref 0.0–0.1)
Basophils Relative: 0 %
Eosinophils Absolute: 0.8 10*3/uL — ABNORMAL HIGH (ref 0.0–0.5)
Eosinophils Relative: 18 %
HCT: 34.6 % — ABNORMAL LOW (ref 39.0–52.0)
Hemoglobin: 11.3 g/dL — ABNORMAL LOW (ref 13.0–17.0)
Immature Granulocytes: 0 %
Lymphocytes Relative: 13 %
Lymphs Abs: 0.6 10*3/uL — ABNORMAL LOW (ref 0.7–4.0)
MCH: 29 pg (ref 26.0–34.0)
MCHC: 32.7 g/dL (ref 30.0–36.0)
MCV: 88.9 fL (ref 80.0–100.0)
Monocytes Absolute: 0.5 10*3/uL (ref 0.1–1.0)
Monocytes Relative: 11 %
Neutro Abs: 2.7 10*3/uL (ref 1.7–7.7)
Neutrophils Relative %: 58 %
Platelets: 91 10*3/uL — ABNORMAL LOW (ref 150–400)
RBC: 3.89 MIL/uL — ABNORMAL LOW (ref 4.22–5.81)
RDW: 16.4 % — ABNORMAL HIGH (ref 11.5–15.5)
WBC: 4.6 10*3/uL (ref 4.0–10.5)
nRBC: 0 % (ref 0.0–0.2)

## 2022-08-31 LAB — RENAL FUNCTION PANEL
Albumin: 3 g/dL — ABNORMAL LOW (ref 3.5–5.0)
Anion gap: 13 (ref 5–15)
BUN: 110 mg/dL — ABNORMAL HIGH (ref 8–23)
CO2: 12 mmol/L — ABNORMAL LOW (ref 22–32)
Calcium: 7.8 mg/dL — ABNORMAL LOW (ref 8.9–10.3)
Chloride: 116 mmol/L — ABNORMAL HIGH (ref 98–111)
Creatinine, Ser: 9.3 mg/dL — ABNORMAL HIGH (ref 0.61–1.24)
GFR, Estimated: 6 mL/min — ABNORMAL LOW (ref >60)
Glucose, Bld: 114 mg/dL — ABNORMAL HIGH (ref 70–99)
Phosphorus: 7.1 mg/dL — ABNORMAL HIGH (ref 2.5–4.6)
Potassium: 3.9 mmol/L (ref 3.5–5.1)
Sodium: 141 mmol/L (ref 135–145)

## 2022-08-31 LAB — BASIC METABOLIC PANEL
Anion gap: 11 (ref 5–15)
BUN: 112 mg/dL — ABNORMAL HIGH (ref 8–23)
CO2: 12 mmol/L — ABNORMAL LOW (ref 22–32)
Calcium: 7.7 mg/dL — ABNORMAL LOW (ref 8.9–10.3)
Chloride: 117 mmol/L — ABNORMAL HIGH (ref 98–111)
Creatinine, Ser: 8.95 mg/dL — ABNORMAL HIGH (ref 0.61–1.24)
GFR, Estimated: 6 mL/min — ABNORMAL LOW (ref >60)
Glucose, Bld: 90 mg/dL (ref 70–99)
Potassium: 3.8 mmol/L (ref 3.5–5.1)
Sodium: 140 mmol/L (ref 135–145)

## 2022-08-31 MED ORDER — STERILE WATER FOR INJECTION IV SOLN
INTRAVENOUS | Status: DC
  Filled 2022-08-31 (×5): qty 1000

## 2022-08-31 MED ORDER — LATANOPROST 0.005 % OP SOLN
1.0000 [drp] | Freq: Every day | OPHTHALMIC | Status: DC
  Administered 2022-08-31 – 2022-09-07 (×8): 1 [drp] via OPHTHALMIC
  Filled 2022-08-31: qty 2.5

## 2022-08-31 MED ORDER — CALCITRIOL 0.25 MCG PO CAPS
0.2500 ug | ORAL_CAPSULE | ORAL | Status: DC
  Administered 2022-09-01 – 2022-09-08 (×5): 0.25 ug via ORAL
  Filled 2022-08-31 (×5): qty 1

## 2022-08-31 MED ORDER — PANTOPRAZOLE SODIUM 40 MG PO TBEC
80.0000 mg | DELAYED_RELEASE_TABLET | Freq: Every day | ORAL | Status: DC
  Administered 2022-08-31 – 2022-09-08 (×9): 80 mg via ORAL
  Filled 2022-08-31 (×9): qty 2

## 2022-08-31 MED ORDER — AMLODIPINE BESYLATE 10 MG PO TABS
10.0000 mg | ORAL_TABLET | Freq: Every day | ORAL | Status: DC
  Administered 2022-08-31 – 2022-09-08 (×9): 10 mg via ORAL
  Filled 2022-08-31 (×9): qty 1

## 2022-08-31 MED ORDER — BRIMONIDINE TARTRATE 0.2 % OP SOLN
1.0000 [drp] | Freq: Every day | OPHTHALMIC | Status: DC
  Administered 2022-08-31 – 2022-09-07 (×7): 1 [drp] via OPHTHALMIC
  Filled 2022-08-31 (×2): qty 5

## 2022-08-31 NOTE — Plan of Care (Signed)
Pt lethargic today slept most of the shift. Seemed to be more delayed with responses today than yesterday. Will cont to monitor

## 2022-08-31 NOTE — Progress Notes (Signed)
PROGRESS NOTE    Glenn Hawkins  UUV:[id number] DOB: [birth year] DOA: 08/30/2022 PCP: [name], MD    No chief complaint on file.   Brief Narrative:  Patient 67 year old gentleman with history of hypertension, type 2 diabetes, hyperlipidemia, CAD, chronic systolic CHF/ischemic cardiomyopathy, severe MR, schizophrenia, iron deficiency anemia, CKD stage IV presented to [facility] with a 2-week history of fatigue, anorexia, intermittent diarrhea.  Patient seen in the ED sats noted to be 99%.  Chest x-ray done within normal limits.  Lab work with a bicarb of 13, BUN of 114, creatinine of 9.77, calcium 7.8, albumin of 3.1, platelets of 123, hemoglobin of 12.6.  Magnesium 1.4.  Patient transferred to [facility] for further evaluation and management of AKI on CKD stage IV.  Nephrology consulted.   Assessment & Plan:   Principal Problem:   AKI (acute kidney injury) (HCC) Active Problems:   Hypertension   Hyperlipidemia   Paranoid schizophrenia (HCC)   Coronary atherosclerosis of native coronary artery   Cardiomyopathy, ischemic   Chronic systolic heart failure (HCC)   Acute deep vein thrombosis (DVT) of femoral vein of right lower extremity (HCC)   Obesity  #1 AKI on CKD stage IV versus chronic progressive kidney disease -Likely secondary to prerenal azotemia in the setting of recent diarrhea, concomitant SGLT2 use, spironolactone, furosemide.  -Patient noted to have  started recently on Jardiance, approximately a month prior to admission.   -Patient admitted with acute AKI/ARF on chronic CKD stage IV with a creatinine noted on admission of 9.35. -Last creatinine noted on 07/16/2022 of 3.40. -Urinalysis done with large hemoglobin, 100 of protein, 11-20 WBCs. -Renal ultrasound done negative for hydronephrosis. -Patient with urine output of 100 cc recorded since admission. -IV fluids changed to bicarb drip per nephrology. -Creatinine slowly trending down currently at 8.95  today. -Nephrology following and appreciate input and recommendations.  2.  Metabolic acidosis -Likely secondary to problem #1. -Patient with no signs or symptoms of infection. -IV fluids changed to bicarb drip per nephrology. -Follow.  3.  Diarrhea -Resolved.  4.  Ischemic cardiomyopathy/CAD -EF 40 to 45% with severe MR. -Patient more dehydrated on IV fluids with gentle hydration. -Continue aspirin, statin. -If BP remains stable could likely resume home regimen Toprol-XL tomorrow.  5.  History of paranoid schizophrenia -Continue home regimen of Zyprexa, Risperdal, Zoloft  6.  Right lower extremity DVT -Continue Eliquis.  7.  Hypertension -Resume home regimen Norvasc for now.  8.  Hyperlipidemia -Continue statin.   DVT prophylaxis: Eliquis Code Status: Full Family Communication: No family at bedside. Disposition: TBD  Status is: Inpatient Remains inpatient appropriate because: Severity of illness   Consultants:  Nephrology: Dr. Arrie Aran 08/30/2022  Procedures:  Renal ultrasound 08/30/2022   Antimicrobials:  Anti-infectives (From admission, onward)    None         Subjective: Patient sleeping deeply.  Objective: Vitals:   08/31/22 0403 08/31/22 0608 08/31/22 0750 08/31/22 1452  BP: 126/76  (!) 152/77 127/73  Pulse: 93  88 76  Resp: 16  16   Temp: 97.9 F (36.6 C)  97.6 F (36.4 C) 97.9 F (36.6 C)  TempSrc: Oral   Oral  SpO2: 100%  100% 100%  Weight:  83.2 kg      Intake/Output Summary (Last 24 hours) at 08/31/2022 1604 Last data filed at 08/31/2022 1100 Gross per 24 hour  Intake 1887.01 ml  Output 300 ml  Net 1587.01 ml   [employer]  08/31/22 0608  Weight: 83.2 kg    Examination:  General exam: Appears calm and comfortable  Respiratory system: Clear to auscultation anterior lung fields.[name] Respiratory effort normal. Cardiovascular system: S1 & S2 heard, RRR. No JVD, murmurs, rubs, gallops or clicks. No pedal edema. Gastrointestinal  system: Abdomen is nondistended, soft and nontender. No organomegaly or masses felt. Normal bowel sounds heard. Central nervous system: Sleeping deeply.  Moving extremities spontaneously. Extremities: Symmetric 5 x 5 power. Skin: No rashes, lesions or ulcers Psychiatry: Judgement and insight appear normal. Mood & affect appropriate.     Data Reviewed: I have personally reviewed following labs and imaging studies  CBC: Recent Labs  Lab 08/31/22 0649  WBC 4.6  NEUTROABS 2.7  HGB 11.3*  HCT 34.6*  MCV 88.9  PLT 91*    Basic Metabolic Panel: Recent Labs  Lab 08/30/22 1810 08/31/22 0115 08/31/22 0649  NA 140 141 140  K 4.9 3.9 3.8  CL 117* 116* 117*  CO2 <7* 12* 12*  GLUCOSE 97 114* 90  BUN 114* 110* 112*  CREATININE 9.35* 9.30* 8.95*  CALCIUM 7.7* 7.8* 7.7*  PHOS  --  7.1*  --     GFR: Estimated Creatinine Clearance: 8.2 mL/min (A) (by C-G formula based on SCr of 8.95 mg/dL (H)).  Liver Function Tests: Recent Labs  Lab 08/31/22 0115  ALBUMIN 3.0*    CBG: No results for input(s): "GLUCAP" in the last 168 hours.   No results found for this or any previous visit (from the past 240 hour(s)).       Radiology Studies: US RENAL  Result Date: 08/30/2022 CLINICAL DATA:  Renal dysfunction EXAM: RENAL / URINARY TRACT ULTRASOUND COMPLETE COMPARISON:  None Available. FINDINGS: Right Kidney: Renal measurements: 9.5 x 5.1 x 4.2 cm = volume: 107.9 mL. There is no hydronephrosis. There is increased cortical echogenicity. There are multiple cysts of varying sizes in the right kidney. Largest of the cysts measures 2.3 cm in maximum diameter. Left Kidney: Renal measurements: 10.4 x 6.4 x 5.3 cm = volume: 155.4 mL. There is no hydronephrosis. There is increased cortical echogenicity. Multiple cysts are seen in left kidney largest measuring 4.7 cm in maximum diameter. Bladder: Urinary bladder is empty and not evaluated. Other: None. IMPRESSION: There is no hydronephrosis. There is  increased cortical echogenicity suggesting medical renal disease. Multiple cysts of varying sizes are noted in both kidneys. Electronically Signed   By: Ernie Avena M.D.   On: 08/30/2022 19:02        Scheduled Meds:  amLODipine  10 mg Oral Daily   apixaban  2.5 mg Oral BID   aspirin EC  81 mg Oral Daily   atorvastatin  80 mg Oral Daily   brimonidine  1 drop Both Eyes Daily   [START ON 09/01/2022] calcitRIOL  0.25 mcg Oral Q M,W,F   Daridorexant HCl  25 mg Oral QHS   latanoprost  1 drop Both Eyes QHS   OLANZapine  15 mg Oral QHS   OLANZapine  5 mg Oral Daily   pantoprazole  80 mg Oral Q1200   risperiDONE  1 mg Oral QHS   sertraline  100 mg Oral Daily   Continuous Infusions:  sodium bicarbonate 150 mEq in sterile water 1,150 mL infusion 75 mL/hr at 08/31/22 1002     LOS: 1 day    Time spent: 35 minutes    Ramiro Harvest, MD [facility]   To contact the attending provider between 7A-7P or the covering provider  during after hours 7P-7A, please log into the web site [email or web address] and access using universal [place] password for that web site. If you do not have the password, please call the hospital operator.  08/31/2022, 4:04 PM

## 2022-08-31 NOTE — Progress Notes (Signed)
Patient ID: Glenn Hawkins, male   DOB: [birth year], 67 y.o.   MRN: [id number] S: "Tired" this morning.  No events overnight. O:BP (!) 152/77 (BP Location: Left Arm)   Pulse 88   Temp 97.6 F (36.4 C)   Resp 16   Wt 83.2 kg   SpO2 100%   BMI 27.09 kg/m   Intake/Output Summary (Last 24 hours) at 08/31/2022 1130 Last data filed at 08/31/2022 0845 Gross per 24 hour  Intake 1887.01 ml  Output 100 ml  Net 1787.01 ml   Intake/Output: I/O last 3 completed shifts: In: 1647 [P.O.:1080; I.V.:567] Out: 100 [Urine:100]  Intake/Output this shift:  Total I/O In: 240 [P.O.:240] Out: -  Weight change:  Gen: NAD CVS: RRR Resp:CTA Abd: +BS, soft, NT/ND Ext: no edema  Recent Labs  Lab 08/30/22 1810 08/31/22 0115 08/31/22 0649  NA 140 141 140  K 4.9 3.9 3.8  CL 117* 116* 117*  CO2 <7* 12* 12*  GLUCOSE 97 114* 90  BUN 114* 110* 112*  CREATININE 9.35* 9.30* 8.95*  ALBUMIN  --  3.0*  --   CALCIUM 7.7* 7.8* 7.7*  PHOS  --  7.1*  --    Liver Function Tests: Recent Labs  Lab 08/31/22 0115  ALBUMIN 3.0*   No results for input(s): "LIPASE", "AMYLASE" in the last 168 hours. Recent Labs  Lab 08/30/22 1952  AMMONIA 60*   CBC: Recent Labs  Lab 08/31/22 0649  WBC 4.6  NEUTROABS 2.7  HGB 11.3*  HCT 34.6*  MCV 88.9  PLT 91*   Cardiac Enzymes: Recent Labs  Lab 08/30/22 1810  CKTOTAL 382   CBG: No results for input(s): "GLUCAP" in the last 168 hours.  Iron Studies: No results for input(s): "IRON", "TIBC", "TRANSFERRIN", "FERRITIN" in the last 72 hours. Studies/Results: US RENAL  Result Date: 08/30/2022 CLINICAL DATA:  Renal dysfunction EXAM: RENAL / URINARY TRACT ULTRASOUND COMPLETE COMPARISON:  None Available. FINDINGS: Right Kidney: Renal measurements: 9.5 x 5.1 x 4.2 cm = volume: 107.9 mL. There is no hydronephrosis. There is increased cortical echogenicity. There are multiple cysts of varying sizes in the right kidney. Largest of the cysts measures 2.3 cm in maximum  diameter. Left Kidney: Renal measurements: 10.4 x 6.4 x 5.3 cm = volume: 155.4 mL. There is no hydronephrosis. There is increased cortical echogenicity. Multiple cysts are seen in left kidney largest measuring 4.7 cm in maximum diameter. Bladder: Urinary bladder is empty and not evaluated. Other: None. IMPRESSION: There is no hydronephrosis. There is increased cortical echogenicity suggesting medical renal disease. Multiple cysts of varying sizes are noted in both kidneys. Electronically Signed   By: Ernie Avena M.D.   On: 08/30/2022 19:02    amLODipine  10 mg Oral Daily   apixaban  2.5 mg Oral BID   aspirin EC  81 mg Oral Daily   atorvastatin  80 mg Oral Daily   brimonidine  1 drop Both Eyes Daily   [START ON 09/01/2022] calcitRIOL  0.25 mcg Oral Q M,W,F   Daridorexant HCl  25 mg Oral QHS   latanoprost  1 drop Both Eyes QHS   OLANZapine  15 mg Oral QHS   OLANZapine  5 mg Oral Daily   pantoprazole  80 mg Oral Q1200   risperiDONE  1 mg Oral QHS   sertraline  100 mg Oral Daily    BMET    Component Value Date/Time   NA 140 08/31/2022 0649   NA 145 (H) 07/14/2022 0915  K 3.8 08/31/2022 0649   CL 117 (H) 08/31/2022 0649   CO2 12 (L) 08/31/2022 0649   GLUCOSE 90 08/31/2022 0649   BUN 112 (H) 08/31/2022 0649   BUN 49 (H) 07/14/2022 0915   CREATININE 8.95 (H) 08/31/2022 0649   CREATININE 4.47 (H) 11/22/2020 1348   CALCIUM 7.7 (L) 08/31/2022 0649   GFRNONAA 6 (L) 08/31/2022 0649   GFRAA 25 (L) 08/02/2019 1515   CBC    Component Value Date/Time   WBC 4.6 08/31/2022 0649   RBC 3.89 (L) 08/31/2022 0649   HGB 11.3 (L) 08/31/2022 0649   HGB 11.9 (L) 07/14/2022 0915   HCT 34.6 (L) 08/31/2022 0649   HCT 37.1 (L) 07/14/2022 0915   PLT 91 (L) 08/31/2022 0649   PLT 114 (L) 07/14/2022 0915   MCV 88.9 08/31/2022 0649   MCV 91 07/14/2022 0915   MCH 29.0 08/31/2022 0649   MCHC 32.7 08/31/2022 0649   RDW 16.4 (H) 08/31/2022 0649   RDW 16.0 (H) 07/14/2022 0915   LYMPHSABS 0.6 (L)  08/31/2022 0649   LYMPHSABS 1.6 08/02/2019 1515   MONOABS 0.5 08/31/2022 0649   EOSABS 0.8 (H) 08/31/2022 0649   EOSABS 0.1 08/02/2019 1515   BASOSABS 0.0 08/31/2022 0649   BASOSABS 0.0 08/02/2019 1515    Assessment/Plan:  AKI/CKD stage IV - hopefully this is ischemic ATN due to volume depletion due to diarrhea and concomitant SGLT-2 I use, fuorsemide, and spironolactone.  His Jardiance was started last month and may be the culprit, but progressive CKD to stage V is also possible.  Renal US w/o obstruction and currently on IVF's.  Urine Na low at 17 c/w volume depletion/pre-renal.  BUN/Cr slightly improved.  Bladder scan with 11 cc.  UOP starting to pick up.  Continue to hold furosemide, spironolactone, and Jardiance for now.  Electrolytes are stable, no indication for dialysis at this time.  Hopefully will see some improvement with IVF's and holding possible offending agents.   Avoid nephrotoxic medications including NSAIDs and iodinated intravenous contrast exposure unless the latter is absolutely indicated.  Preferred narcotic agents for pain control are hydromorphone, fentanyl, and methadone. Morphine should not be used. Avoid Baclofen and avoid oral sodium phosphate and magnesium citrate based laxatives / bowel preps. Continue strict Input and Output monitoring. Will monitor the patient closely with you and intervene or adjust therapy as indicated by changes in clinical status/labs   Diarrhea - workup per primary svc ICMP - EF 40-45% and severe MR.  Currently volume depleted but need to follow closely while getting IVF's.  Will decrease the rate to 100 mL/hr for now and follow.  H/o paranoid schizophrenia - continue with home meds HTN - stable DM type 2 - per primary Iron deficiency anemia - Hgb up to 12.6 but now 11.3 after IVF's.  Continue to follow.    Irena Cords, MD [facility]

## 2022-09-01 ENCOUNTER — Other Ambulatory Visit: Payer: Self-pay

## 2022-09-01 DIAGNOSIS — E785 Hyperlipidemia, unspecified: Secondary | ICD-10-CM | POA: Diagnosis not present

## 2022-09-01 DIAGNOSIS — N179 Acute kidney failure, unspecified: Secondary | ICD-10-CM | POA: Diagnosis not present

## 2022-09-01 DIAGNOSIS — I82411 Acute embolism and thrombosis of right femoral vein: Secondary | ICD-10-CM | POA: Diagnosis not present

## 2022-09-01 DIAGNOSIS — I10 Essential (primary) hypertension: Secondary | ICD-10-CM | POA: Diagnosis not present

## 2022-09-01 LAB — RENAL FUNCTION PANEL
Albumin: 2.8 g/dL — ABNORMAL LOW (ref 3.5–5.0)
Anion gap: 12 (ref 5–15)
BUN: 103 mg/dL — ABNORMAL HIGH (ref 8–23)
CO2: 15 mmol/L — ABNORMAL LOW (ref 22–32)
Calcium: 7.6 mg/dL — ABNORMAL LOW (ref 8.9–10.3)
Chloride: 114 mmol/L — ABNORMAL HIGH (ref 98–111)
Creatinine, Ser: 7.27 mg/dL — ABNORMAL HIGH (ref 0.61–1.24)
GFR, Estimated: 8 mL/min — ABNORMAL LOW (ref >60)
Glucose, Bld: 76 mg/dL (ref 70–99)
Phosphorus: 5.7 mg/dL — ABNORMAL HIGH (ref 2.5–4.6)
Potassium: 3.1 mmol/L — ABNORMAL LOW (ref 3.5–5.1)
Sodium: 141 mmol/L (ref 135–145)

## 2022-09-01 LAB — CBC
HCT: 32.5 % — ABNORMAL LOW (ref 39.0–52.0)
Hemoglobin: 10.7 g/dL — ABNORMAL LOW (ref 13.0–17.0)
MCH: 29.3 pg (ref 26.0–34.0)
MCHC: 32.9 g/dL (ref 30.0–36.0)
MCV: 89 fL (ref 80.0–100.0)
Platelets: 88 10*3/uL — ABNORMAL LOW (ref 150–400)
RBC: 3.65 MIL/uL — ABNORMAL LOW (ref 4.22–5.81)
RDW: 16 % — ABNORMAL HIGH (ref 11.5–15.5)
WBC: 4.5 10*3/uL (ref 4.0–10.5)
nRBC: 0 % (ref 0.0–0.2)

## 2022-09-01 MED ORDER — POTASSIUM CHLORIDE CRYS ER 20 MEQ PO TBCR: 20.0000 meq | EXTENDED_RELEASE_TABLET | ORAL | Status: DC

## 2022-09-01 MED ORDER — SODIUM BICARBONATE 650 MG PO TABS
1300.0000 mg | ORAL_TABLET | Freq: Three times a day (TID) | ORAL | Status: DC
  Administered 2022-09-01 – 2022-09-08 (×22): 1300 mg via ORAL
  Filled 2022-09-01 (×23): qty 2

## 2022-09-01 MED ORDER — POTASSIUM CHLORIDE CRYS ER 20 MEQ PO TBCR
20.0000 meq | EXTENDED_RELEASE_TABLET | ORAL | Status: AC
  Administered 2022-09-01 (×2): 20 meq via ORAL
  Filled 2022-09-01 (×2): qty 1

## 2022-09-01 MED ORDER — POTASSIUM CHLORIDE 10 MEQ/100ML IV SOLN
10.0000 meq | INTRAVENOUS | Status: AC
  Administered 2022-09-01 (×4): 10 meq via INTRAVENOUS
  Filled 2022-09-01 (×4): qty 100

## 2022-09-01 NOTE — Progress Notes (Signed)
Patient ID: Glenn Hawkins, male   DOB: [birth year], 67 y.o.   MRN: [id number] S: "Tired" again this morning - denies specific complaints but easily asleep.  No events overnight. O:BP (!) 149/86 (BP Location: Left Arm)   Pulse 85   Temp 98.4 F (36.9 C)   Resp 19   Wt 83.2 kg   SpO2 99%   BMI 27.09 kg/m   Intake/Output Summary (Last 24 hours) at 09/01/2022 1124 Last data filed at 09/01/2022 0700 Gross per 24 hour  Intake 2062.59 ml  Output 400 ml  Net 1662.59 ml    Intake/Output: yest 2.3 / 0.6L +3.2L for admission Gen: NAD CVS: RRR Resp:CTA Abd: +BS, soft, NT/ND Ext: no edema  Recent Labs  Lab 08/30/22 1810 08/31/22 0115 08/31/22 0649 09/01/22 0845  NA 140 141 140 141  K 4.9 3.9 3.8 3.1*  CL 117* 116* 117* 114*  CO2 <7* 12* 12* 15*  GLUCOSE 97 114* 90 76  BUN 114* 110* 112* 103*  CREATININE 9.35* 9.30* 8.95* 7.27*  ALBUMIN  --  3.0*  --  2.8*  CALCIUM 7.7* 7.8* 7.7* 7.6*  PHOS  --  7.1*  --  5.7*    Liver Function Tests: Recent Labs  Lab 08/31/22 0115 09/01/22 0845  ALBUMIN 3.0* 2.8*    No results for input(s): "LIPASE", "AMYLASE" in the last 168 hours. Recent Labs  Lab 08/30/22 1952  AMMONIA 60*    CBC: Recent Labs  Lab 08/31/22 0649 09/01/22 0845  WBC 4.6 4.5  NEUTROABS 2.7  --   HGB 11.3* 10.7*  HCT 34.6* 32.5*  MCV 88.9 89.0  PLT 91* 88*    Cardiac Enzymes: Recent Labs  Lab 08/30/22 1810  CKTOTAL 382    CBG: No results for input(s): "GLUCAP" in the last 168 hours.  Iron Studies: No results for input(s): "IRON", "TIBC", "TRANSFERRIN", "FERRITIN" in the last 72 hours. Studies/Results: US RENAL  Result Date: 08/30/2022 CLINICAL DATA:  Renal dysfunction EXAM: RENAL / URINARY TRACT ULTRASOUND COMPLETE COMPARISON:  None Available. FINDINGS: Right Kidney: Renal measurements: 9.5 x 5.1 x 4.2 cm = volume: 107.9 mL. There is no hydronephrosis. There is increased cortical echogenicity. There are multiple cysts of varying sizes in the right  kidney. Largest of the cysts measures 2.3 cm in maximum diameter. Left Kidney: Renal measurements: 10.4 x 6.4 x 5.3 cm = volume: 155.4 mL. There is no hydronephrosis. There is increased cortical echogenicity. Multiple cysts are seen in left kidney largest measuring 4.7 cm in maximum diameter. Bladder: Urinary bladder is empty and not evaluated. Other: None. IMPRESSION: There is no hydronephrosis. There is increased cortical echogenicity suggesting medical renal disease. Multiple cysts of varying sizes are noted in both kidneys. Electronically Signed   By: Ernie Avena M.D.   On: 08/30/2022 19:02    amLODipine  10 mg Oral Daily   apixaban  2.5 mg Oral BID   aspirin EC  81 mg Oral Daily   atorvastatin  80 mg Oral Daily   brimonidine  1 drop Both Eyes Daily   calcitRIOL  0.25 mcg Oral Q M,W,F   Daridorexant HCl  25 mg Oral QHS   latanoprost  1 drop Both Eyes QHS   OLANZapine  15 mg Oral QHS   OLANZapine  5 mg Oral Daily   pantoprazole  80 mg Oral Q1200   risperiDONE  1 mg Oral QHS   sertraline  100 mg Oral Daily   sodium bicarbonate  1,300 mg Oral TID  BMET    Component Value Date/Time   NA 141 09/01/2022 0845   NA 145 (H) 07/14/2022 0915   K 3.1 (L) 09/01/2022 0845   CL 114 (H) 09/01/2022 0845   CO2 15 (L) 09/01/2022 0845   GLUCOSE 76 09/01/2022 0845   BUN 103 (H) 09/01/2022 0845   BUN 49 (H) 07/14/2022 0915   CREATININE 7.27 (H) 09/01/2022 0845   CREATININE 4.47 (H) 11/22/2020 1348   CALCIUM 7.6 (L) 09/01/2022 0845   GFRNONAA 8 (L) 09/01/2022 0845   GFRAA 25 (L) 08/02/2019 1515   CBC    Component Value Date/Time   WBC 4.5 09/01/2022 0845   RBC 3.65 (L) 09/01/2022 0845   HGB 10.7 (L) 09/01/2022 0845   HGB 11.9 (L) 07/14/2022 0915   HCT 32.5 (L) 09/01/2022 0845   HCT 37.1 (L) 07/14/2022 0915   PLT 88 (L) 09/01/2022 0845   PLT 114 (L) 07/14/2022 0915   MCV 89.0 09/01/2022 0845   MCV 91 07/14/2022 0915   MCH 29.3 09/01/2022 0845   MCHC 32.9 09/01/2022 0845   RDW  16.0 (H) 09/01/2022 0845   RDW 16.0 (H) 07/14/2022 0915   LYMPHSABS 0.6 (L) 08/31/2022 0649   LYMPHSABS 1.6 08/02/2019 1515   MONOABS 0.5 08/31/2022 0649   EOSABS 0.8 (H) 08/31/2022 0649   EOSABS 0.1 08/02/2019 1515   BASOSABS 0.0 08/31/2022 0649   BASOSABS 0.0 08/02/2019 1515    Assessment/Plan:  AKI/CKD stage IV - hopefully this is ischemic ATN due to volume depletion due to diarrhea and concomitant SGLT-2 I use, fuorsemide, and spironolactone.  His Jardiance was started last month and may be the culprit, but progressive CKD to stage V is also possible.  Renal US w/o obstruction and currently on IVF's.  Urine Na low at 17 c/w volume depletion/pre-renal.  BUN/Cr slightly improved.  Bladder scan with 11 cc.  UOP starting to pick up.  Continue to hold furosemide, spironolactone, and Jardiance for now.  Electrolytes are stable, no indication for dialysis at this time.  Hopefully will see continue to see improvement with IVF's and holding possible offending agents.   Avoid nephrotoxic medications including NSAIDs and iodinated intravenous contrast exposure unless the latter is absolutely indicated.  Preferred narcotic agents for pain control are hydromorphone, fentanyl, and methadone. Morphine should not be used. Avoid Baclofen and avoid oral sodium phosphate and magnesium citrate based laxatives / bowel preps. Continue strict Input and Output monitoring. Will monitor the patient closely with you and intervene or adjust therapy as indicated by changes in clinical status/labs   Diarrhea - workup per primary svc ICMP - EF 40-45% and severe MR.  Currently volume depleted but need to follow closely while getting IVF's.   Metabolic acidosis: in setting of AKI. Bicarb improving from < 7 to 15 today on bicarb gtt - cont through tomorrow AM and starting po bicarb overlap today.   H/o paranoid schizophrenia - continue with home meds HTN - stable DM type 2 - per primary Iron deficiency anemia - Hb 10s.  No  overt bleeding.  Continue to follow.    Estill Bakes MD [facility] Pager [phone]

## 2022-09-01 NOTE — Evaluation (Signed)
Occupational Therapy Evaluation Patient Details Name: Glenn Hawkins MRN: [id number] DOB: 1955/07/28 Today's Date: 09/01/2022   History of Present Illness Pt is a 67 y/o male presenting on 7/6 transferred from [facility] with AKI on stage IV CKD. PMH includes: CKD, cdiff, HTN, DM2, chronic CHF, ischemic cardiomyopathy, MI, PAD, schizophrenia, severe mitral regurgitation, iron deficiency anemia.   Clinical Impression   PTA patient reports independent and driving, using cane intermittently as needed for mobility but managing all ADLS and IADLs.  Admitted for above and presents with problem list below, including impaired balance, generalized weakness and impaired cognition. Pt lethargic, but only oriented to self.  Follows 1 step commands with increased time and demonstrates poor awareness and problem solving.  He completes bed mobility with min guard, transfers with min assist, functional mobility to bathroom with min guard to min assist and ADLS with up to min assist.  Pt reports he has a sister who can assist initially as needed.  Hopeful pt will progress well and be able to manage with HHOT services at dc.  Will follow acutely.      Recommendations for follow up therapy are one component of a multi-disciplinary discharge planning process, led by the attending physician.  Recommendations may be updated based on patient status, additional functional criteria and insurance authorization.   Assistance Recommended at Discharge Frequent or constant Supervision/Assistance  Patient can return home with the following A little help with walking and/or transfers;A little help with bathing/dressing/bathroom;Assistance with cooking/housework;Direct supervision/assist for medications management;Direct supervision/assist for financial management;Assist for transportation    Functional Status Assessment  Patient has had a recent decline in their functional status and demonstrates the ability to make  significant improvements in function in a reasonable and predictable amount of time.  Equipment Recommendations  None recommended by OT    Recommendations for Other Services       Precautions / Restrictions Precautions Precautions: Fall Restrictions Weight Bearing Restrictions: No      Mobility Bed Mobility Overal bed mobility: Needs Assistance Bed Mobility: Supine to Sit, Sit to Supine     Supine to sit: Min guard Sit to supine: Min guard   General bed mobility comments: increased time, no physical assist required    Transfers Overall transfer level: Needs assistance Equipment used: None Transfers: Sit to/from Stand Sit to Stand: Min assist           General transfer comment: to power up and steady from EOB      Balance Overall balance assessment: Needs assistance Sitting-balance support: No upper extremity supported, Feet supported Sitting balance-Leahy Scale: Good     Standing balance support: No upper extremity supported, During functional activity Standing balance-Leahy Scale: Fair Standing balance comment: pt reaching out for UE support, mild unsteadiness                           ADL either performed or assessed with clinical judgement   ADL Overall ADL's : Needs assistance/impaired     Grooming: Set up;Sitting           Upper Body Dressing : Set up;Sitting   Lower Body Dressing: Minimal assistance;Sit to/from stand   Toilet Transfer: Minimal assistance;Ambulation   Toileting- Clothing Manipulation and Hygiene: Min guard;Sit to/from stand       Functional mobility during ADLs: Minimal assistance       Vision Baseline Vision/History: 1 Wears glasses Vision Assessment?: No apparent visual deficits  Perception     Praxis      Pertinent Vitals/Pain       Hand Dominance Right   Extremity/Trunk Assessment Upper Extremity Assessment Upper Extremity Assessment: Generalized weakness   Lower Extremity  Assessment Lower Extremity Assessment: Defer to PT evaluation       Communication Communication Communication: No difficulties   Cognition Arousal/Alertness: Lethargic Behavior During Therapy: Flat affect Overall Cognitive Status: Impaired/Different from baseline Area of Impairment: Orientation, Attention, Memory, Following commands, Safety/judgement, Awareness, Problem solving                 Orientation Level: Disoriented to, Time, Situation, Place Current Attention Level: Focused Memory: Decreased recall of precautions, Decreased short-term memory Following Commands: Follows one step commands consistently, Follows one step commands with increased time Safety/Judgement: Decreased awareness of safety, Decreased awareness of deficits Awareness: Emergent Problem Solving: Slow processing, Decreased initiation, Requires verbal cues General Comments: patient oriented to self, unable to voice month even when given choices, in regards to place and situation pt voices "It will come back to me".  He follow simple commands with increased time.     General Comments  VSS    Exercises     Shoulder Instructions      Home Living Family/patient expects to be discharged to:: Private residence Living Arrangements: Alone Available Help at Discharge: Family;Available PRN/intermittently Type of Home: House Home Access: Level entry     Home Layout: One level     Bathroom Shower/Tub: Producer, television/film/video: Standard     Home Equipment: Agricultural consultant (2 wheels);Cane - single point;BSC/3in1;Shower seat          Prior Functioning/Environment Prior Level of Function : Independent/Modified Independent;Driving             Mobility Comments: at times using cane for mobility ADLs Comments: reports independent ADLs, IADLs (recently reports a RN is coming out 1x/week but pt can't voice why)        OT Problem List: Decreased strength;Decreased activity  tolerance;Impaired balance (sitting and/or standing);Decreased cognition;Decreased safety awareness;Decreased knowledge of use of DME or AE;Decreased knowledge of precautions      OT Treatment/Interventions: Self-care/ADL training;Therapeutic exercise;DME and/or AE instruction;Therapeutic activities;Balance training;Patient/family education;Cognitive remediation/compensation;Energy conservation    OT Goals(Current goals can be found in the care plan section) Acute Rehab OT Goals Patient Stated Goal: get better OT Goal Formulation: With patient Time For Goal Achievement: 09/15/22 Potential to Achieve Goals: Good  OT Frequency: Min 2X/week    Co-evaluation              AM-PAC OT "6 Clicks" Daily Activity     Outcome Measure Help from another person eating meals?: None Help from another person taking care of personal grooming?: A Little Help from another person toileting, which includes using toliet, bedpan, or urinal?: A Little Help from another person bathing (including washing, rinsing, drying)?: A Little Help from another person to put on and taking off regular upper body clothing?: A Little Help from another person to put on and taking off regular lower body clothing?: A Little 6 Click Score: 19   End of Session Equipment Utilized During Treatment: Gait belt Nurse Communication: Mobility status  Activity Tolerance: Patient tolerated treatment well Patient left: with call bell/phone within reach;in bed  OT Visit Diagnosis: Other abnormalities of gait and mobility (R26.89);Muscle weakness (generalized) (M62.81);Other symptoms and signs involving cognitive function                Time:  1610-9604 OT Time Calculation (min): 19 min Charges:  OT General Charges $OT Visit: 1 Visit OT Evaluation $OT Eval Moderate Complexity: 1 Mod  Barry Brunner, OT Acute Rehabilitation Services Office [phone]   Chancy Milroy 09/01/2022, 1:10 PM

## 2022-09-01 NOTE — Progress Notes (Signed)
PROGRESS NOTE    Glenn Hawkins  VWU:[id number] DOB: Mar 05, 1955 DOA: 08/30/2022 PCP: [name], MD    No chief complaint on file.   Brief Narrative:  Patient 67 year old gentleman with history of hypertension, type 2 diabetes, hyperlipidemia, CAD, chronic systolic CHF/ischemic cardiomyopathy, severe MR, schizophrenia, iron deficiency anemia, CKD stage IV presented to [facility] with a 2-week history of fatigue, anorexia, intermittent diarrhea.  Patient seen in the ED sats noted to be 99%.  Chest x-ray done within normal limits.  Lab work with a bicarb of 13, BUN of 114, creatinine of 9.77, calcium 7.8, albumin of 3.1, platelets of 123, hemoglobin of 12.6.  Magnesium 1.4.  Patient transferred to [facility] for further evaluation and management of AKI on CKD stage IV.  Nephrology consulted.   Assessment & Plan:   Principal Problem:   AKI (acute kidney injury) (HCC) Active Problems:   Hypertension   Hyperlipidemia   Paranoid schizophrenia (HCC)   Coronary atherosclerosis of native coronary artery   Cardiomyopathy, ischemic   Chronic systolic heart failure (HCC)   Acute deep vein thrombosis (DVT) of femoral vein of right lower extremity (HCC)   Obesity  #1 AKI on CKD stage IV versus chronic progressive kidney disease -Likely secondary to prerenal azotemia in the setting of recent diarrhea, concomitant SGLT2 use, spironolactone, furosemide.  -Patient noted to have  started recently on Jardiance, approximately a month prior to admission.   -Patient admitted with acute AKI/ARF on chronic CKD stage IV with a creatinine noted on admission of 9.35. -Last creatinine noted on 07/16/2022 of 3.40. -Urinalysis done with large hemoglobin, 100 of protein, 11-20 WBCs. -Renal ultrasound done negative for hydronephrosis. -Patient with urine output of 600 cc over the past 24 hours.   -Continue bicarb drip.   -Renal function slowly improving with creatinine slowly trending down currently  at 7.27 from 9.35 on admission.  -Nephrology following and appreciate input and recommendations.  2.  Metabolic acidosis -Likely secondary to problem #1. -Patient with no signs or symptoms of infection. -Slowly improving on bicarb drip.   -Nephrology following.  -Follow.  3.  Diarrhea -Resolved.  4.  Ischemic cardiomyopathy/CAD -EF 40 to 45% with severe MR. -Patient more dehydrated on IV fluids with gentle hydration. -Continue aspirin, statin. -If BP remains stable over the next 24 hours we will resume Toprol-XL tomorrow.    5.  History of paranoid schizophrenia -Continue home regimen of Zyprexa, Risperdal, Zoloft  6.  Right lower extremity DVT -Eliquis.    7.  Hypertension -Continue Norvasc.   8.  Hyperlipidemia -Statin.  9.  Hypokalemia -Replete.   DVT prophylaxis: Eliquis Code Status: Full Family Communication: Updated sister at bedside. Disposition: TBD  Status is: Inpatient Remains inpatient appropriate because: Severity of illness   Consultants:  Nephrology: Dr. Arrie Aran 08/30/2022  Procedures:  Renal ultrasound 08/30/2022   Antimicrobials:  Anti-infectives (From admission, onward)    None         Subjective: Patient awake alert.  States still feels fatigued and somewhat tired.  Overall feels he is slowly improving.  Denies any chest pain, no shortness of breath, no abdominal pain.  Patient states diarrhea has improved and his stool is more mushy.  Sister at bedside who states patient was taken off spironolactone a while back.   Objective: Vitals:   08/31/22 1452 08/31/22 2117 09/01/22 0456 09/01/22 0814  BP: 127/73 (!) 147/78 134/81 (!) 149/86  Pulse: 76 82 93 85  Resp:  17 17  19  Temp: 97.9 F (36.6 C) 98.4 F (36.9 C) 98.1 F (36.7 C) 98.4 F (36.9 C)  TempSrc: Oral Oral Oral   SpO2: 100% 100% 99% 99%  Weight:        Intake/Output Summary (Last 24 hours) at 09/01/2022 1315 Last data filed at 09/01/2022 0700 Gross per 24 hour  Intake  1942.59 ml  Output 400 ml  Net 1542.59 ml    Filed Weights   08/31/22 0608  Weight: 83.2 kg    Examination:  General exam: NAD. Respiratory system: CTAB.  No wheezes, no crackles, no rhonchi.  Fair air movement.  Speaking in full sentences.   Cardiovascular system: RRR no murmurs rubs or gallops.  No JVD.  No lower extremity edema. Gastrointestinal system: Abdomen is soft, nontender, nondistended, positive bowel sounds.  No rebound.  No guarding.  Central nervous system: Alert and oriented.  Moving extremities spontaneously.  No focal neurological deficits.   Extremities: Symmetric 5 x 5 power. Skin: No rashes, lesions or ulcers Psychiatry: Judgement and insight appear fair. Mood & affect appropriate.     Data Reviewed: I have personally reviewed following labs and imaging studies  CBC: Recent Labs  Lab 08/31/22 0649 09/01/22 0845  WBC 4.6 4.5  NEUTROABS 2.7  --   HGB 11.3* 10.7*  HCT 34.6* 32.5*  MCV 88.9 89.0  PLT 91* 88*     Basic Metabolic Panel: Recent Labs  Lab 08/30/22 1810 08/31/22 0115 08/31/22 0649 09/01/22 0845  NA 140 141 140 141  K 4.9 3.9 3.8 3.1*  CL 117* 116* 117* 114*  CO2 <7* 12* 12* 15*  GLUCOSE 97 114* 90 76  BUN 114* 110* 112* 103*  CREATININE 9.35* 9.30* 8.95* 7.27*  CALCIUM 7.7* 7.8* 7.7* 7.6*  PHOS  --  7.1*  --  5.7*     GFR: Estimated Creatinine Clearance: 10.1 mL/min (A) (by C-G formula based on SCr of 7.27 mg/dL (H)).  Liver Function Tests: Recent Labs  Lab 08/31/22 0115 09/01/22 0845  ALBUMIN 3.0* 2.8*     CBG: No results for input(s): "GLUCAP" in the last 168 hours.   No results found for this or any previous visit (from the past 240 hour(s)).       Radiology Studies: US RENAL  Result Date: 08/30/2022 CLINICAL DATA:  Renal dysfunction EXAM: RENAL / URINARY TRACT ULTRASOUND COMPLETE COMPARISON:  None Available. FINDINGS: Right Kidney: Renal measurements: 9.5 x 5.1 x 4.2 cm = volume: 107.9 mL. There is no  hydronephrosis. There is increased cortical echogenicity. There are multiple cysts of varying sizes in the right kidney. Largest of the cysts measures 2.3 cm in maximum diameter. Left Kidney: Renal measurements: 10.4 x 6.4 x 5.3 cm = volume: 155.4 mL. There is no hydronephrosis. There is increased cortical echogenicity. Multiple cysts are seen in left kidney largest measuring 4.7 cm in maximum diameter. Bladder: Urinary bladder is empty and not evaluated. Other: None. IMPRESSION: There is no hydronephrosis. There is increased cortical echogenicity suggesting medical renal disease. Multiple cysts of varying sizes are noted in both kidneys. Electronically Signed   By: Ernie Avena M.D.   On: 08/30/2022 19:02        Scheduled Meds:  amLODipine  10 mg Oral Daily   apixaban  2.5 mg Oral BID   aspirin EC  81 mg Oral Daily   atorvastatin  80 mg Oral Daily   brimonidine  1 drop Both Eyes Daily   calcitRIOL  0.25 mcg Oral Q  M,W,F   Daridorexant HCl  25 mg Oral QHS   latanoprost  1 drop Both Eyes QHS   OLANZapine  15 mg Oral QHS   OLANZapine  5 mg Oral Daily   pantoprazole  80 mg Oral Q1200   risperiDONE  1 mg Oral QHS   sertraline  100 mg Oral Daily   sodium bicarbonate  1,300 mg Oral TID   Continuous Infusions:  sodium bicarbonate 150 mEq in sterile water 1,150 mL infusion 75 mL/hr at 08/31/22 2353     LOS: 2 days    Time spent: 35 minutes    Ramiro Harvest, MD [facility]   To contact the attending provider between 7A-7P or the covering provider during after hours 7P-7A, please log into the web site [email or web address] and access using universal [place] password for that web site. If you do not have the password, please call the hospital operator.  09/01/2022, 1:15 PM

## 2022-09-01 NOTE — Plan of Care (Signed)
  Problem: Clinical Measurements: Goal: Respiratory complications will improve Outcome: Progressing Goal: Cardiovascular complication will be avoided Outcome: Progressing   Problem: Activity: Goal: Risk for activity intolerance will decrease Outcome: Progressing   Problem: Nutrition: Goal: Adequate nutrition will be maintained Outcome: Progressing   Problem: Elimination: Goal: Will not experience complications related to urinary retention Outcome: Progressing   Problem: Pain Managment: Goal: General experience of comfort will improve Outcome: Progressing   

## 2022-09-02 DIAGNOSIS — N179 Acute kidney failure, unspecified: Secondary | ICD-10-CM | POA: Diagnosis not present

## 2022-09-02 DIAGNOSIS — I82411 Acute embolism and thrombosis of right femoral vein: Secondary | ICD-10-CM | POA: Diagnosis not present

## 2022-09-02 DIAGNOSIS — E785 Hyperlipidemia, unspecified: Secondary | ICD-10-CM | POA: Diagnosis not present

## 2022-09-02 DIAGNOSIS — I10 Essential (primary) hypertension: Secondary | ICD-10-CM | POA: Diagnosis not present

## 2022-09-02 LAB — RENAL FUNCTION PANEL
Albumin: 2.8 g/dL — ABNORMAL LOW (ref 3.5–5.0)
Anion gap: 12 (ref 5–15)
BUN: 91 mg/dL — ABNORMAL HIGH (ref 8–23)
CO2: 21 mmol/L — ABNORMAL LOW (ref 22–32)
Calcium: 7.9 mg/dL — ABNORMAL LOW (ref 8.9–10.3)
Chloride: 111 mmol/L (ref 98–111)
Creatinine, Ser: 6.11 mg/dL — ABNORMAL HIGH (ref 0.61–1.24)
GFR, Estimated: 10 mL/min — ABNORMAL LOW (ref >60)
Glucose, Bld: 84 mg/dL (ref 70–99)
Phosphorus: 3.7 mg/dL (ref 2.5–4.6)
Potassium: 3.3 mmol/L — ABNORMAL LOW (ref 3.5–5.1)
Sodium: 144 mmol/L (ref 135–145)

## 2022-09-02 LAB — CBC WITH DIFFERENTIAL/PLATELET
Abs Immature Granulocytes: 0.02 10*3/uL (ref 0.00–0.07)
Basophils Absolute: 0 10*3/uL (ref 0.0–0.1)
Basophils Relative: 1 %
Eosinophils Absolute: 0.7 10*3/uL — ABNORMAL HIGH (ref 0.0–0.5)
Eosinophils Relative: 15 %
HCT: 32.7 % — ABNORMAL LOW (ref 39.0–52.0)
Hemoglobin: 10.9 g/dL — ABNORMAL LOW (ref 13.0–17.0)
Immature Granulocytes: 0 %
Lymphocytes Relative: 18 %
Lymphs Abs: 0.8 10*3/uL (ref 0.7–4.0)
MCH: 28.3 pg (ref 26.0–34.0)
MCHC: 33.3 g/dL (ref 30.0–36.0)
MCV: 84.9 fL (ref 80.0–100.0)
Monocytes Absolute: 0.5 10*3/uL (ref 0.1–1.0)
Monocytes Relative: 11 %
Neutro Abs: 2.6 10*3/uL (ref 1.7–7.7)
Neutrophils Relative %: 55 %
Platelets: 88 10*3/uL — ABNORMAL LOW (ref 150–400)
RBC: 3.85 MIL/uL — ABNORMAL LOW (ref 4.22–5.81)
RDW: 15.9 % — ABNORMAL HIGH (ref 11.5–15.5)
WBC: 4.6 10*3/uL (ref 4.0–10.5)
nRBC: 0 % (ref 0.0–0.2)

## 2022-09-02 MED ORDER — METOPROLOL SUCCINATE ER 25 MG PO TB24
25.0000 mg | ORAL_TABLET | Freq: Every day | ORAL | Status: DC
  Administered 2022-09-02 – 2022-09-08 (×7): 25 mg via ORAL
  Filled 2022-09-02 (×7): qty 1

## 2022-09-02 MED ORDER — POTASSIUM CHLORIDE CRYS ER 10 MEQ PO TBCR
40.0000 meq | EXTENDED_RELEASE_TABLET | Freq: Once | ORAL | Status: AC
  Administered 2022-09-02: 40 meq via ORAL
  Filled 2022-09-02: qty 4

## 2022-09-02 MED ORDER — ZOLPIDEM TARTRATE 5 MG PO TABS
5.0000 mg | ORAL_TABLET | Freq: Every evening | ORAL | Status: DC | PRN
  Administered 2022-09-03 – 2022-09-07 (×5): 5 mg via ORAL
  Filled 2022-09-02 (×6): qty 1

## 2022-09-02 NOTE — TOC Initial Note (Signed)
Transition of Care [facility]) - Initial/Assessment Note    Patient Details  Name: Glenn Hawkins MRN: [id number] Date of Birth: [birth year]  Transition [facility]) CM/SW Contact:    Tom-Johnson, Hershal Coria, RN Phone Number: 09/02/2022, 3:45 PM  Clinical Narrative:                  CM spoke with patient at bedside about needs for post hospital transition.  Presented to [facility] for Generalized Weakness, Fatigue Insomnia, Loss of Appetite, Nausea and Diarrhea. Patient's BUN was 114 and Creatinine was 9.7 from Lab work at   [facility]. Patient was transferred to [facility] for AKI on stage IV CKD. Neurology following, BUN 91 and Creatinine 6.11 today, improving, no indication for Dialysis at this time.   Patient is from home alone, has two supportive daughters and three siblings. Retired and independent with his care.  Has a cane, walker, shower seat and scale at home.  PCP is [name], MD and uses [employer] in [place].   Patient states he has no preference for home health, referral called in to [facility] and Elnita Maxwell voiced acceptance, info on AVS.   CM will continue to follow as patient progresses with care towards discharge.    Expected Discharge Plan: Home w Home Health Services Barriers to Discharge: Continued Medical Work up   Patient Goals and CMS Choice Patient states their goals for this hospitalization and ongoing recovery are:: To return home CMS Medicare.gov Compare Post Acute Care list provided to:: Patient Choice offered to / list presented to : Patient      Expected Discharge Plan and Services   Discharge Planning Services: CM Consult Post Acute Care Choice: Home Health Living arrangements for the past 2 months: Single Family Home                 DME Arranged: N/A DME Agency: NA       HH Arranged: PT, OT   Date HH Agency Contacted: 09/02/22 Time HH Agency Contacted: [phone]    Prior Living Arrangements/Services Living arrangements for  the past 2 months: Single Family Home Lives with:: Self Patient language and need for interpreter reviewed:: Yes Do you feel safe going back to the place where you live?: Yes      Need for Family Participation in Patient Care: Yes (Comment) Care giver support system in place?: Yes (comment) Current home services: DME (Cane, walker, scale) Criminal Activity/Legal Involvement Pertinent to Current Situation/Hospitalization: No - Comment as needed  Activities of Daily Living Home Assistive Devices/Equipment: Walker (specify type) ADL Screening (condition at time of admission) Patient's cognitive ability adequate to safely complete daily activities?: Yes Is the patient deaf or have difficulty hearing?: No Does the patient have difficulty seeing, even when wearing glasses/contacts?: No Does the patient have difficulty concentrating, remembering, or making decisions?: No Patient able to express need for assistance with ADLs?: Yes Does the patient have difficulty dressing or bathing?: No Independently performs ADLs?: Yes (appropriate for developmental age) Does the patient have difficulty walking or climbing stairs?: No Weakness of Legs: None Weakness of Arms/Hands: None  Permission Sought/Granted Permission sought to share information with : Facility Medical Hawkins representative, Family Supports Permission granted to share information with : Yes, Verbal Permission Granted              Emotional Assessment Appearance:: Appears stated age Attitude/Demeanor/Rapport: Engaged, Gracious Affect (typically observed): Accepting, Appropriate, Calm, Hopeful, Pleasant Orientation: : Oriented to Self, Oriented to Place,  Oriented to  Time, Oriented to Situation Alcohol / Substance Use: Not Applicable Psych Involvement: No (comment)  Admission diagnosis:  AKI (acute kidney injury) (HCC) [N17.9] Patient Active Problem List   Diagnosis Date Noted   AKI (acute kidney injury) (HCC) 08/30/2022   Severe  mitral regurgitation 07/16/2022   Acute on chronic systolic heart failure (HCC) 07/03/2022   DOE (dyspnea on exertion) 05/16/2022   Combined systolic and diastolic congestive heart failure (HCC) 05/06/2022   Acute respiratory failure with hypoxia (HCC) 05/06/2022   Elevated troponin 05/06/2022   Iron deficiency anemia 05/06/2022   History of DVT (deep vein thrombosis) 05/06/2022   Chronic anticoagulation 05/06/2022   Thrombocytopenia (HCC) 05/06/2022   Paranoid schizophrenia (HCC) 05/06/2022   Unintentional weight loss 11/08/2020   Pancreatic lesion 11/08/2020   Increased ammonia level 11/08/2020   Abdominal pain 11/08/2020   Diarrhea 11/08/2020   Indigestion 11/08/2020   Abnormality of abdominal aorta 11/08/2020   Hyperlipidemia LDL goal <70    Hemorrhoids 04/18/2015   Colon polyps 04/18/2015   Rectal bleed 11/08/2014   CKD (chronic kidney disease) stage 3, GFR 30-59 ml/min (HCC) 11/08/2014   Hyperglycemia 11/08/2014   GERD without esophagitis 11/08/2014   Obesity 11/08/2014   Rectal bleeding 11/08/2014   Acute deep vein thrombosis (DVT) of femoral vein of right lower extremity (HCC) 10/18/2014   Chronic systolic heart failure (HCC) 12/09/2013   Cardiomyopathy, ischemic 06/07/2013   Mitral valve disorder    Coronary atherosclerosis of native coronary artery    Hypertension    Hyperlipidemia    PCP:  [name], MD Pharmacy:   [facility] [phone] - [place], [place] - [address] AT [facility] [address] [place] [address] Phone: [phone] Fax: [phone]  [facility] - [place], [place] - [address] [place] [address] Phone: [phone] Fax: [phone]  [facility] - [place], [place] - [address] [place] [address] Phone: [phone] Fax: [phone]  [name] [address] [place] [address] Phone: [phone] Fax: [phone]     Social Determinants of Health (SDOH) Social History: SDOH Screenings   Food Insecurity: No Food Insecurity (08/30/2022)  Housing: Low Risk  (08/30/2022)  Transportation Needs: No Transportation Needs (08/30/2022)  Utilities: Not At Risk (08/30/2022)  Financial Resource Strain: Low Risk  (07/18/2022)  Physical Activity: Inactive (08/07/2022)  Tobacco Use: Medium Risk (08/30/2022)   SDOH Interventions: Transportation Interventions: Intervention Not Indicated, Inpatient TOC, Patient Resources (Friends/Family)   Readmission Risk Interventions    09/02/2022    3:42 PM  Readmission Risk Prevention Plan  Transportation Screening Complete  PCP or Specialist Appt within 3-5 Days Complete  HRI or Home Care Consult Complete  Social Work Consult for Recovery Care Planning/Counseling Complete  Palliative Care Screening Not Applicable  Medication Review Oceanographer) Referral to Pharmacy

## 2022-09-02 NOTE — Care Management Important Message (Signed)
Important Message  Patient Details  Name: Glenn Hawkins MRN: [id number] Date of Birth: 1955-04-24   Medicare Important Message Given:  Yes     Dorena Bodo 09/02/2022, 2:29 PM

## 2022-09-02 NOTE — Progress Notes (Signed)
Patient ID: Glenn Hawkins, male   DOB: Dec 06, 1955, 67 y.o.   MRN: [id number] S: "Tired" again this morning - denies specific complaints but easily asleep.  No events overnight. Says eating fine.  O:BP (!) 153/85 (BP Location: Left Arm)   Pulse 89   Temp 98.3 F (36.8 C)   Resp 18   Ht 5\' 9"  (1.753 m)   Wt 80.2 kg   SpO2 99%   BMI 26.11 kg/m   Intake/Output Summary (Last 24 hours) at 09/02/2022 1008 Last data filed at 09/02/2022 0900 Gross per 24 hour  Intake 2777.21 ml  Output 1500 ml  Net 1277.21 ml    Intake/Output: yest 2.7/1.5L +4.5L for admission Gen: NAD CVS: RRR Resp:CTA Abd: +BS, soft, NT/ND Ext: no edema  Recent Labs  Lab 08/30/22 1810 08/31/22 0115 08/31/22 0649 09/01/22 0845 09/02/22 0349  NA 140 141 140 141 144  K 4.9 3.9 3.8 3.1* 3.3*  CL 117* 116* 117* 114* 111  CO2 <7* 12* 12* 15* 21*  GLUCOSE 97 114* 90 76 84  BUN 114* 110* 112* 103* 91*  CREATININE 9.35* 9.30* 8.95* 7.27* 6.11*  ALBUMIN  --  3.0*  --  2.8* 2.8*  CALCIUM 7.7* 7.8* 7.7* 7.6* 7.9*  PHOS  --  7.1*  --  5.7* 3.7    Liver Function Tests: Recent Labs  Lab 08/31/22 0115 09/01/22 0845 09/02/22 0349  ALBUMIN 3.0* 2.8* 2.8*    No results for input(s): "LIPASE", "AMYLASE" in the last 168 hours. Recent Labs  Lab 08/30/22 1952  AMMONIA 60*    CBC: Recent Labs  Lab 08/31/22 0649 09/01/22 0845 09/02/22 0349  WBC 4.6 4.5 4.6  NEUTROABS 2.7  --  2.6  HGB 11.3* 10.7* 10.9*  HCT 34.6* 32.5* 32.7*  MCV 88.9 89.0 84.9  PLT 91* 88* 88*    Cardiac Enzymes: Recent Labs  Lab 08/30/22 1810  CKTOTAL 382    CBG: No results for input(s): "GLUCAP" in the last 168 hours.  Iron Studies: No results for input(s): "IRON", "TIBC", "TRANSFERRIN", "FERRITIN" in the last 72 hours. Studies/Results: No results found.  amLODipine  10 mg Oral Daily   apixaban  2.5 mg Oral BID   aspirin EC  81 mg Oral Daily   atorvastatin  80 mg Oral Daily   brimonidine  1 drop Both Eyes Daily   calcitRIOL   0.25 mcg Oral Q M,W,F   Daridorexant HCl  25 mg Oral QHS   latanoprost  1 drop Both Eyes QHS   OLANZapine  15 mg Oral QHS   OLANZapine  5 mg Oral Daily   pantoprazole  80 mg Oral Q1200   risperiDONE  1 mg Oral QHS   sertraline  100 mg Oral Daily   sodium bicarbonate  1,300 mg Oral TID    BMET    Component Value Date/Time   NA 144 09/02/2022 0349   NA 145 (H) 07/14/2022 0915   K 3.3 (L) 09/02/2022 0349   CL 111 09/02/2022 0349   CO2 21 (L) 09/02/2022 0349   GLUCOSE 84 09/02/2022 0349   BUN 91 (H) 09/02/2022 0349   BUN 49 (H) 07/14/2022 0915   CREATININE 6.11 (H) 09/02/2022 0349   CREATININE 4.47 (H) 11/22/2020 1348   CALCIUM 7.9 (L) 09/02/2022 0349   GFRNONAA 10 (L) 09/02/2022 0349   GFRAA 25 (L) 08/02/2019 1515   CBC    Component Value Date/Time   WBC 4.6 09/02/2022 0349   RBC 3.85 (L) 09/02/2022  0349   HGB 10.9 (L) 09/02/2022 0349   HGB 11.9 (L) 07/14/2022 0915   HCT 32.7 (L) 09/02/2022 0349   HCT 37.1 (L) 07/14/2022 0915   PLT 88 (L) 09/02/2022 0349   PLT 114 (L) 07/14/2022 0915   MCV 84.9 09/02/2022 0349   MCV 91 07/14/2022 0915   MCH 28.3 09/02/2022 0349   MCHC 33.3 09/02/2022 0349   RDW 15.9 (H) 09/02/2022 0349   RDW 16.0 (H) 07/14/2022 0915   LYMPHSABS 0.8 09/02/2022 0349   LYMPHSABS 1.6 08/02/2019 1515   MONOABS 0.5 09/02/2022 0349   EOSABS 0.7 (H) 09/02/2022 0349   EOSABS 0.1 08/02/2019 1515   BASOSABS 0.0 09/02/2022 0349   BASOSABS 0.0 08/02/2019 1515    Assessment/Plan:  AKI/CKD stage IV - hopefully this is ischemic ATN due to volume depletion due to diarrhea and concomitant SGLT-2 I use, fuorsemide, and spironolactone.  His Jardiance was started last month and may be the culprit, but progressive CKD to stage V is also possible.  Renal US w/o obstruction.  Urine Na low at 17 c/w volume depletion/pre-renal.  BUN/Cr slowly improving.  UOP improved.  Continue to hold lasix, spironolactone, and Jardiance for now.  Electrolytes are stable, no indication  for dialysis at this time.  Hopefully will see continue to see improvement.  Avoid nephrotoxic medications including NSAIDs and iodinated intravenous contrast exposure unless the latter is absolutely indicated.  Preferred narcotic agents for pain control are hydromorphone, fentanyl, and methadone. Morphine should not be used. Avoid Baclofen and avoid oral sodium phosphate and magnesium citrate based laxatives / bowel preps. Continue strict Input and Output monitoring. Will monitor the patient closely with you and intervene or adjust therapy as indicated by changes in clinical status/labs   Diarrhea - resolved ICMP - EF 40-45% and severe MR.  Appearing euvolemic, will hold IVF and diuretics for today.  Prob will resume oral diuretics in coming days.  Metabolic acidosis: in setting of AKI. Bicarb improving from < 7 to 22 today on bicarb gtt + po bicarb, stopping bicarb gtt today, cont po for now H/o paranoid schizophrenia - continue with home meds HTN - stable DM type 2 - per primary Iron deficiency anemia - Hb 10s.  No overt bleeding.  Continue to follow.   Hypokalemia - repleted  Dispo - remain admitted still to ensure will continue to improve off IVF now.   Estill Bakes MD [facility] Pager [phone]

## 2022-09-02 NOTE — Progress Notes (Addendum)
PROGRESS NOTE    Glenn Hawkins  ZOX:[id number] DOB: Oct 10, 1955 DOA: 08/30/2022 PCP: [name], MD    No chief complaint on file.   Brief Narrative:  Patient 67 year old gentleman with history of hypertension, type 2 diabetes, hyperlipidemia, CAD, chronic systolic CHF/ischemic cardiomyopathy, severe MR, schizophrenia, iron deficiency anemia, CKD stage IV presented to [facility] with a 2-week history of fatigue, anorexia, intermittent diarrhea.  Patient seen in the ED sats noted to be 99%.  Chest x-ray done within normal limits.  Lab work with a bicarb of 13, BUN of 114, creatinine of 9.77, calcium 7.8, albumin of 3.1, platelets of 123, hemoglobin of 12.6.  Magnesium 1.4.  Patient transferred to [facility] for further evaluation and management of AKI on CKD stage IV.  Nephrology consulted.   Assessment & Plan:   Principal Problem:   AKI (acute kidney injury) (HCC) Active Problems:   Hypertension   Hyperlipidemia   Paranoid schizophrenia (HCC)   Coronary atherosclerosis of native coronary artery   Cardiomyopathy, ischemic   Chronic systolic heart failure (HCC)   Acute deep vein thrombosis (DVT) of femoral vein of right lower extremity (HCC)   Obesity  #1 AKI on CKD stage IV versus chronic progressive kidney disease -Likely secondary to prerenal azotemia in the setting of recent diarrhea, concomitant SGLT2 use, spironolactone, furosemide.  -Patient noted to have  started recently on Jardiance, approximately a month prior to admission.   -Patient admitted with acute AKI/ARF on chronic CKD stage IV with a creatinine noted on admission of 9.35. -Last creatinine noted on 07/16/2022 of 3.40. -Urinalysis done with large hemoglobin, 100 of protein, 11-20 WBCs. -Renal ultrasound done negative for hydronephrosis. -Patient with urine output of 1500 cc over the past 24 hours.   -Renal function improving with hydration and creatinine currently down to 6.11 from 9.35 on  admission. -Bicarb drip discontinued per nephrology today. -Monitor urine output follow renal function. -Nephrology following and appreciate input and recommendations.  2.  Metabolic acidosis -Likely secondary to problem #1. -Patient with no signs or symptoms of infection. -Acidosis improved on bicarb drip.   -Bicarb drip discontinued per nephrology today.   -Nephrology following.    3.  Diarrhea -Resolved.  4.  Ischemic cardiomyopathy/CAD -EF 40 to 45% with severe MR. -Patient noted to be dehydrated on admission and placed on IV fluids with gentle hydration.   -IV fluids discontinued today.   -Continue to hold diuretics of spironolactone and Lasix.   -Continue aspirin, statin.  More dehydrated on IV fluids with gentle hydration. -Continue aspirin, statin. -Start Toprol-XL at a reduced dose of 25 mg daily.    5.  History of paranoid schizophrenia -Stable on home regimen of Zyprexa, risperidone, Zoloft.  6.  History of right lower extremity DVT -Continue Eliquis.   7.  Hypertension -Norvasc  8.  Hyperlipidemia -Continue statin.  9.  Hypokalemia -K-Dur 40 mEq p.o. x 1.     DVT prophylaxis: Eliquis Code Status: Full Family Communication: Updated patient.  No family at bedside.  Disposition: TBD  Status is: Inpatient Remains inpatient appropriate because: Severity of illness   Consultants:  Nephrology: Dr. Arrie Aran 08/30/2022  Procedures:  Renal ultrasound 08/30/2022   Antimicrobials:  Anti-infectives (From admission, onward)    None         Subjective: Sitting up at the side of the bed working with therapy.  Denies any chest pain no shortness of breath.  Tiredness and fatigue slowly improving.  Denies any further diarrhea.  Objective: Vitals:   09/02/22 0346 09/02/22 0429 09/02/22 0929 09/02/22 1730  BP: (!) 143/85  (!) 153/85 127/86  Pulse: 81  89 (!) 101  Resp: 20  18 20   Temp: 98.3 F (36.8 C)  98.3 F (36.8 C) 98.5 F (36.9 C)  TempSrc:       SpO2: 100%  99% 100%  Weight:  80.2 kg    Height:        Intake/Output Summary (Last 24 hours) at 09/02/2022 1753 Last data filed at 09/02/2022 1732 Gross per 24 hour  Intake 1493.76 ml  Output 1525 ml  Net -31.24 ml    Filed Weights   08/31/22 0608 09/02/22 0429  Weight: 83.2 kg 80.2 kg    Examination:  General exam: NAD. Respiratory system: Lungs clear to auscultation bilaterally.  No wheezes, no crackles, no rhonchi.  Fair air movement.  Cardiovascular system: Regular rate rhythm no murmurs rubs or gallops.  No JVD.  No lower extremity edema. Gastrointestinal system: Abdomen soft, nontender, nondistended, positive bowel sounds.  No rebound.  No guarding.  Central nervous system: Alert and oriented.  Moving extremities spontaneously.  No focal neurological deficits.   Extremities: Symmetric 5 x 5 power. Skin: No rashes, lesions or ulcers Psychiatry: Judgement and insight appear fair. Mood & affect appropriate.     Data Reviewed: I have personally reviewed following labs and imaging studies  CBC: Recent Labs  Lab 08/31/22 0649 09/01/22 0845 09/02/22 0349  WBC 4.6 4.5 4.6  NEUTROABS 2.7  --  2.6  HGB 11.3* 10.7* 10.9*  HCT 34.6* 32.5* 32.7*  MCV 88.9 89.0 84.9  PLT 91* 88* 88*     Basic Metabolic Panel: Recent Labs  Lab 08/30/22 1810 08/31/22 0115 08/31/22 0649 09/01/22 0845 09/02/22 0349  NA 140 141 140 141 144  K 4.9 3.9 3.8 3.1* 3.3*  CL 117* 116* 117* 114* 111  CO2 <7* 12* 12* 15* 21*  GLUCOSE 97 114* 90 76 84  BUN 114* 110* 112* 103* 91*  CREATININE 9.35* 9.30* 8.95* 7.27* 6.11*  CALCIUM 7.7* 7.8* 7.7* 7.6* 7.9*  PHOS  --  7.1*  --  5.7* 3.7     GFR: Estimated Creatinine Clearance: 12.1 mL/min (A) (by C-G formula based on SCr of 6.11 mg/dL (H)).  Liver Function Tests: Recent Labs  Lab 08/31/22 0115 09/01/22 0845 09/02/22 0349  ALBUMIN 3.0* 2.8* 2.8*     CBG: No results for input(s): "GLUCAP" in the last 168 hours.   No results  found for this or any previous visit (from the past 240 hour(s)).       Radiology Studies: No results found.      Scheduled Meds:  amLODipine  10 mg Oral Daily   apixaban  2.5 mg Oral BID   aspirin EC  81 mg Oral Daily   atorvastatin  80 mg Oral Daily   brimonidine  1 drop Both Eyes Daily   calcitRIOL  0.25 mcg Oral Q M,W,F   Daridorexant HCl  25 mg Oral QHS   latanoprost  1 drop Both Eyes QHS   OLANZapine  15 mg Oral QHS   OLANZapine  5 mg Oral Daily   pantoprazole  80 mg Oral Q1200   risperiDONE  1 mg Oral QHS   sertraline  100 mg Oral Daily   sodium bicarbonate  1,300 mg Oral TID   Continuous Infusions:     LOS: 3 days    Time spent: 35 minutes    Glenn Hawkins  Janee Morn, MD [facility]   To contact the attending provider between 7A-7P or the covering provider during after hours 7P-7A, please log into the web site [email or web address] and access using universal [place] password for that web site. If you do not have the password, please call the hospital operator.  09/02/2022, 5:53 PM

## 2022-09-02 NOTE — Evaluation (Signed)
Physical Therapy Evaluation Patient Details Name: Glenn Hawkins MRN: [id number] DOB: Feb 18, 1956 Today's Date: 09/02/2022  History of Present Illness  Pt is a 67 y/o male presenting on 7/6 transferred from [facility] with AKI on stage IV CKD. PMH includes: CKD, cdiff, HTN, DM2, chronic CHF, ischemic cardiomyopathy, MI, PAD, schizophrenia, severe mitral regurgitation, iron deficiency anemia.   Clinical Impression  Prior to admission, patient was independent with functional mobility and intermittent assistance with ADLs. Pt reports nursing aides visit home twice a week for medication management. He reports use of a cane for household ambulation and is supported by his son who is available PRN following discharge. Patient tolerated today's session well; able to remain alert after seated EOB. Today, the patient was able to perform bed mobility and transfers without PT assistance. Performed ambulation with HHA from PT; presented with mild instability but able to self correct. Ambulation distance limited due to fatigue; educated patient on LE exercises while seated in recliner. Following today's performance, PT recommend discharge to home with continued skilled physical therapy in order to improve balance, strength, and promote safety.        Assistance Recommended at Discharge Intermittent Supervision/Assistance  If plan is discharge home, recommend the following:  Can travel by private vehicle  A little help with walking and/or transfers;A little help with bathing/dressing/bathroom;Assist for transportation;Help with stairs or ramp for entrance        Equipment Recommendations None recommended by PT  Recommendations for Other Services       Functional Status Assessment Patient has had a recent decline in their functional status and demonstrates the ability to make significant improvements in function in a reasonable and predictable amount of time.     Precautions / Restrictions  Precautions Precautions: Fall Restrictions Weight Bearing Restrictions: No      Mobility  Bed Mobility Overal bed mobility: Needs Assistance Bed Mobility: Supine to Sit     Supine to sit: Supervision, HOB elevated     General bed mobility comments: Increased time with HOB elevated in order to keep patient awake. No physical assist required to transition to EOB.    Transfers Overall transfer level: Needs assistance Equipment used: None Transfers: Sit to/from Stand Sit to Stand: Min guard           General transfer comment: Able to power up without physical assist; min guard to steady upon upright stance.    Ambulation/Gait Ambulation/Gait assistance: Min guard Gait Distance (Feet): 100 Feet Assistive device: 1 person hand held assist Gait Pattern/deviations: Step-through pattern, Decreased step length - right, Decreased step length - left, Decreased stride length Gait velocity: decreased Gait velocity interpretation: <1.8 ft/sec, indicate of risk for recurrent falls   General Gait Details: Presented with decreased velocity and stride length. Verbal cues provided for forward gaze. HHA provided to patient throughout ambulation to simulate cane in right hand. One instance of minor instability as base of support narrowed; able to self recover.  Stairs            Wheelchair Mobility     Tilt Bed    Modified Rankin (Stroke Patients Only)       Balance Overall balance assessment: Needs assistance Sitting-balance support: No upper extremity supported, Feet supported Sitting balance-Leahy Scale: Good Sitting balance - Comments: Able to sit EOB without UE support   Standing balance support: During functional activity, Single extremity supported Standing balance-Leahy Scale: Fair Standing balance comment: Required single UE support throughout functional mobility, mild unsteadiness standing  when returning to recliner.                              Pertinent Vitals/Pain Pain Assessment Pain Assessment: No/denies pain    Home Living Family/patient expects to be discharged to:: Private residence Living Arrangements: Alone Available Help at Discharge: Family;Available PRN/intermittently Type of Home: House Home Access: Level entry       Home Layout: One level Home Equipment: Agricultural consultant (2 wheels);Cane - single point;BSC/3in1;Shower seat      Prior Function Prior Level of Function : Independent/Modified Independent;Driving             Mobility Comments: at times using cane for mobility ADLs Comments: x2 a week aide, support for medications     Hand Dominance   Dominant Hand: Right    Extremity/Trunk Assessment   Upper Extremity Assessment Upper Extremity Assessment: Generalized weakness    Lower Extremity Assessment Lower Extremity Assessment: Generalized weakness    Cervical / Trunk Assessment Cervical / Trunk Assessment: Normal  Communication   Communication: No difficulties  Cognition Arousal/Alertness: Awake/alert Behavior During Therapy: Flat affect Overall Cognitive Status: Impaired/Different from baseline Area of Impairment: Attention, Memory, Following commands, Safety/judgement, Awareness, Problem solving                 Orientation Level: Disoriented to Current Attention Level: Focused Memory: Decreased recall of precautions, Decreased short-term memory Following Commands: Follows one step commands consistently, Follows one step commands with increased time Safety/Judgement: Decreased awareness of safety, Decreased awareness of deficits Awareness: Emergent Problem Solving: Slow processing, Requires verbal cues General Comments: Patient oriented to self, place and situation. Time corrected with verbal cues. Follows simple commands with increased time.        General Comments General comments (skin integrity, edema, etc.): Lethargic in beginning of the session, able to awaken  with wet cloth and prompt to sit EOB.    Exercises     Assessment/Plan    PT Assessment Patient needs continued PT services  PT Problem List Decreased strength;Decreased range of motion;Decreased activity tolerance;Decreased balance;Decreased coordination;Decreased mobility;Decreased cognition;Decreased safety awareness       PT Treatment Interventions DME instruction;Gait training;Stair training;Functional mobility training;Therapeutic activities;Therapeutic exercise;Neuromuscular re-education;Balance training    PT Goals (Current goals can be found in the Care Plan section)  Acute Rehab PT Goals Patient Stated Goal: Patient to return home PT Goal Formulation: With patient Time For Goal Achievement: 09/16/22 Potential to Achieve Goals: Good    Frequency Min 2X/week     Co-evaluation               AM-PAC PT "6 Clicks" Mobility  Outcome Measure Help needed turning from your back to your side while in a flat bed without using bedrails?: None Help needed moving from lying on your back to sitting on the side of a flat bed without using bedrails?: A Little Help needed moving to and from a bed to a chair (including a wheelchair)?: A Little Help needed standing up from a chair using your arms (e.g., wheelchair or bedside chair)?: A Little Help needed to walk in hospital room?: A Little Help needed climbing 3-5 steps with a railing? : A Little 6 Click Score: 19    End of Session Equipment Utilized During Treatment: Gait belt Activity Tolerance: Patient tolerated treatment well Patient left: in chair;with call bell/phone within reach;with chair alarm set Nurse Communication: Mobility status PT Visit Diagnosis: Unsteadiness on feet (R26.81);Muscle  weakness (generalized) (M62.81);Other abnormalities of gait and mobility (R26.89)    Time: 2130-8657 PT Time Calculation (min) (ACUTE ONLY): 24 min   Charges:   PT Evaluation $PT Eval Moderate Complexity: 1 Mod PT  Treatments $Gait Training: 8-22 mins PT General Charges $$ ACUTE PT VISIT: 1 Visit         Christene Lye, SPT Acute Rehabilitation Services [phone] Secure chat preferred    Christene Lye 09/02/2022, 1:01 PM

## 2022-09-03 ENCOUNTER — Telehealth: Payer: Self-pay | Admitting: *Deleted

## 2022-09-03 ENCOUNTER — Encounter (HOSPITAL_COMMUNITY): Payer: Medicare Other

## 2022-09-03 DIAGNOSIS — F200 Paranoid schizophrenia: Secondary | ICD-10-CM | POA: Diagnosis not present

## 2022-09-03 DIAGNOSIS — E785 Hyperlipidemia, unspecified: Secondary | ICD-10-CM | POA: Diagnosis not present

## 2022-09-03 DIAGNOSIS — N179 Acute kidney failure, unspecified: Secondary | ICD-10-CM | POA: Diagnosis not present

## 2022-09-03 DIAGNOSIS — I10 Essential (primary) hypertension: Secondary | ICD-10-CM | POA: Diagnosis not present

## 2022-09-03 LAB — CBC
HCT: 32.2 % — ABNORMAL LOW (ref 39.0–52.0)
Hemoglobin: 10.6 g/dL — ABNORMAL LOW (ref 13.0–17.0)
MCH: 28.6 pg (ref 26.0–34.0)
MCHC: 32.9 g/dL (ref 30.0–36.0)
MCV: 86.8 fL (ref 80.0–100.0)
Platelets: 82 10*3/uL — ABNORMAL LOW (ref 150–400)
RBC: 3.71 MIL/uL — ABNORMAL LOW (ref 4.22–5.81)
RDW: 15.7 % — ABNORMAL HIGH (ref 11.5–15.5)
WBC: 5.2 10*3/uL (ref 4.0–10.5)
nRBC: 0 % (ref 0.0–0.2)

## 2022-09-03 LAB — RENAL FUNCTION PANEL
Albumin: 2.8 g/dL — ABNORMAL LOW (ref 3.5–5.0)
Anion gap: 9 (ref 5–15)
BUN: 76 mg/dL — ABNORMAL HIGH (ref 8–23)
CO2: 22 mmol/L (ref 22–32)
Calcium: 7.8 mg/dL — ABNORMAL LOW (ref 8.9–10.3)
Chloride: 111 mmol/L (ref 98–111)
Creatinine, Ser: 5.07 mg/dL — ABNORMAL HIGH (ref 0.61–1.24)
GFR, Estimated: 12 mL/min — ABNORMAL LOW (ref >60)
Glucose, Bld: 79 mg/dL (ref 70–99)
Phosphorus: 3.1 mg/dL (ref 2.5–4.6)
Potassium: 3.4 mmol/L — ABNORMAL LOW (ref 3.5–5.1)
Sodium: 142 mmol/L (ref 135–145)

## 2022-09-03 LAB — AMMONIA: Ammonia: 33 umol/L (ref 9–35)

## 2022-09-03 LAB — HEPATIC FUNCTION PANEL
ALT: 16 U/L (ref 0–44)
AST: 17 U/L (ref 15–41)
Albumin: 3 g/dL — ABNORMAL LOW (ref 3.5–5.0)
Alkaline Phosphatase: 120 U/L (ref 38–126)
Bilirubin, Direct: <0.1 mg/dL (ref 0.0–0.2)
Total Bilirubin: 0.6 mg/dL (ref 0.3–1.2)
Total Protein: 6.2 g/dL — ABNORMAL LOW (ref 6.5–8.1)

## 2022-09-03 MED ORDER — POTASSIUM CHLORIDE CRYS ER 20 MEQ PO TBCR
40.0000 meq | EXTENDED_RELEASE_TABLET | Freq: Once | ORAL | Status: AC
  Administered 2022-09-03: 40 meq via ORAL
  Filled 2022-09-03: qty 2
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
## 2022-09-03 NOTE — Hospital Course (Addendum)
The patient is a 67 year old chronically ill-appearing African-American male with a past medical history significant for but limited to hypertension, diabetes mellitus type 2, hyperlipidemia, CAD, history of chronic systolic CHF and ischemic cardiomyopathy, history of severe mitral regurgitation, schizophrenia, iron deficiency anemia, CKD stage IV as well as other comorbidities who presented to [facility] facility with a 2-week history of fatigue, anorexia and intermittent diarrhea.  He was seen in the ED and saturations were noted to be 99%.  Chest x-ray was done within normal limits.  Lab work was showing a bicarbonate level of 13, BUN of 114 and creatinine of 9.77.  Mag level is low and he is scheduled to [name] for further evaluation management of AKI on CKD stage IV.    Nephrology was consulted and patient was hydrated.  Renal function continues to improve daily.  Intemittently appears somonlent and drowsy.  PT OT was recommending home health but Sister says he does not have 24/7 care that PT is recommending. PT/OT now recommending SNF. Patient does not want to go to SNF however he has changed his mind and agreeable to Rehab. Awaiting placement and appears medically stable for Discharge.   Assessment and Plan:  AKI on CKD stage IV versus chronic progressive kidney disease, improving and stabilizing  -Likely secondary to prerenal azotemia in the setting of recent diarrhea, concomitant SGLT2 use, spironolactone, furosemide.   -Patient noted to have  started recently on Jardiance, approximately a month prior to admission.   -Patient admitted with acute AKI/ARF on chronic CKD stage IV with a creatinine noted on admission of 9.35. -Last creatinine noted on 07/16/2022 of 3.40. -BUN/Cr Trend: Recent Labs  Lab 09/02/22 0349 09/03/22 0143 09/04/22 0155 09/05/22 0447 09/06/22 0359 09/07/22 0244 09/08/22 0045  BUN 91* 76* 65* 53* 52* 52* 56*  CREATININE 6.11* 5.07* 4.32* 3.78* 3.71* 3.53*  3.72*  -Urinalysis done with large hemoglobin, 100 of protein, 11-20 WBCs. -Renal ultrasound done negative for hydronephrosis. - Intake/Output Summary (Last 24 hours) at 09/08/2022 1310 Last data filed at 09/08/2022 0800 Gross per 24 hour  Intake 890 ml  Output --  Net 890 ml   -Renal function improving daily but Nephrology probably suspects that we will continue to see improvement but as a baseline is fairly advanced already -Bicarb drip discontinued per nephrology yesterday. -Monitor urine output follow renal function. -Avoid Nephrotoxic Medications, Contrast Dyes, Hypotension and Dehydration to Ensure Adequate Renal Perfusion and will need to Renally Adjust Meds -Continue to Monitor and Trend Renal Function carefully and repeat CMP in the AM  -Nephrology following and appreciate input and recommendations and they are recommending to follow-up in the outpatient setting in 1 to 2 weeks and continue to hold spironolactone, Jardiance and Lasix and resume Lasix if he has edema. -PT/OT initially recommending home health but he needs 24/7 per PT/OT and sister says he does not have that so PT/OT re-evaluated and Recommending SNF and patient is now agreeable and awaiting placement    Metabolic Acidosis, improved  -Likely secondary to problem #1. -Patient with no signs or symptoms of infection. -Acidosis improved on bicarb drip but slightly dropped.  Now CO2 is 22, anion gap is 15, chloride level is 103  -Continue with sodium bicarb at 1300 mg p.o. 3 times daily -Bicarb drip discontinued per nephrology yesterday.   -Nephrology following appreciate further evaluation and assistance.     Diarrhea -Resolved.  Lethargy and Somnolence, improved -Received Ambien last night -Arousable and answers questions appropriately but falls asleep  easily -Ammonia Level was elevated a few days ago but improved and went from 60 -> 33 -If persist may consider obtaining ABG and TSH, RPR, B12   Ischemic  Cardiomyopathy/CAD -EF 40 to 45% with severe MR. -Patient noted to be dehydrated on admission and placed on IV fluids with gentle hydration.   -IV fluids discontinued -Continue to hold diuretics of spironolactone and Lasix.   -Continue Aspirin 81 mg po daily and Atorvastatin 80 mg po Daily -C/w Toprol-XL at a reduced dose of 25 mg daily.   -CXR done and showed "There is increased density in right parahilar region suggesting possible pneumonia. Less likely possibility would be asymmetric pulmonary edema. Increased markings are seen in the left lower lung field which may suggest crowding of bronchovascular structures due to elevation of left hemidiaphragm or atelectasis/pneumonia." -Will provide Flutter Valve and Incentive Spirometry   History of Paranoid Schizophrenia -Stable on home regimen of Olanzapine 5 mg po Daily and 15 mg po qHS, Risperidone 1 mg po qHS, and Sertraline 100 mg po Daily    History of Right Lower Extremity DVT -Continue Anticoagulation with Apixaban .    Hypertension -Continue with Amlodipine 10 mg po Daily and Metoprolol Succinate 25 mg po daily -Continue to Monitor BP per Protocol -Last BP Reading was 151/90   Hyperlipidemia -Continue Atorvastatin 80 mg po Daily  Hypophosphatemia -Phos Level Trend: Recent Labs  Lab 09/02/22 0349 09/03/22 0143 09/04/22 0155 09/05/22 0447 09/06/22 0359 09/07/22 0244 09/08/22 0045  PHOS 3.7 3.1 2.5 2.0* 1.7* 2.8 2.4*  -Continue to Monitor and Replete as Necessary -Repeat Phos Level in the AM  Left Knee Pain, improving  -Start Prednisone Taper and improving;p will go to 50 mg po daily -Concern for Gout but Right anterior knee is tender to palpate and swollen -Obtained DG X-Ray of the the Knee and showed "No acute findings are seen in the bony structures of left knee.There is no significant effusion. There is soft tissue swelling anterior to the proximal tibia at the attachment of infrapatellar tendon suggesting bursitis  or tendinosis. Arteriosclerosis." -Check Uric Acid Level in the AM -Order Ice  -Will consider Ortho Evaluation if not improving but can bee seen in the outpatient setting    Hypomagnesemia -Patient's Mag Level Trend: Recent Labs  Lab 09/04/22 0155 09/05/22 0447 09/06/22 0359 09/07/22 0244 09/08/22 0045  MG 1.1* 1.8 2.1 1.8 2.0  -Continue to Monitor and Replete as Necessary -Repeat Mag in the AM    Normocytic Anemia/Anemia of Chronic Kidney Disease Thrombocytopenia -Hgb/Hct and Platelet Count Trend: Recent Labs  Lab 09/02/22 0349 09/03/22 0143 09/04/22 0155 09/05/22 0447 09/06/22 0359 09/07/22 0244 09/08/22 0045  HGB 10.9* 10.6* 10.6* 10.4* 10.5* 10.1* 9.9*  HCT 32.7* 32.2* 32.9* 32.0* 32.2* 31.6* 31.3*  MCV 84.9 86.8 87.7 86.5 89.7 88.5 88.9  PLT 88* 82* 72* 70* 69* 74* 80*  -Checked Anemia Panel showed an iron level of 36, UIBC of 166, TIBC of 202, saturation ratios of 18%, ferritin low 281, folate of 5.3, and vitamin B12 417 -Will start folic acid supplement and iron supplements -Continue to Monitor for S/Sx of Bleeding; No overt bleeding noted -Repeat CBC in the AM  Hypokalemia -Patient's K+ Level Trend: Recent Labs  Lab 09/02/22 0349 09/03/22 0143 09/04/22 0155 09/05/22 0447 09/06/22 0359 09/07/22 0244 09/08/22 0045  K 3.3* 3.4* 3.7 3.9 3.9 3.9 3.4*  -Replete with po Kcl 40 mEQ x1 and po K Phos Neutral 500 mg x1 -Continue to Monitor and Replete  as Necessary -Repeat CMP in the AM   Hypoalbuminemia -Patient's Albumin Trend: Recent Labs  Lab 09/03/22 0143 09/03/22 1223 09/04/22 0155 09/05/22 0447 09/06/22 0359 09/07/22 0244 09/08/22 0045  ALBUMIN 2.8* 3.0* 2.8* 2.8* 2.7* 2.7* 2.8*  -Continue to Monitor and Trend and repeat CMP in the AM

## 2022-09-03 NOTE — Progress Notes (Addendum)
PROGRESS NOTE    Glenn Hawkins  ZOX:[id number] DOB: March 24, 1955 DOA: 08/30/2022 PCP: [name], MD   Brief Narrative:  The patient is a 67 year old chronically ill-appearing African-American male with a past medical history significant for but limited to hypertension, diabetes mellitus type 2, hyperlipidemia, CAD, history of chronic systolic CHF and ischemic cardiomyopathy, history of severe mitral regurgitation, schizophrenia, iron deficiency anemia, CKD stage IV as well as other comorbidities who presented to [facility] facility with a 2-week history of fatigue, anorexia and intermittent diarrhea.  He was seen in the ED and saturations were noted to be 99%.  Chest x-ray was done within normal limits.  Lab work was showing a bicarbonate level of 13, BUN of 114 and creatinine of 9.77.  Mag level is low and he is scheduled to [name] for further evaluation management of AKI on CKD stage IV.  Nephrology was consulted and patient was hydrated.  Renal function continues to improve daily.  Today he is little bit somnolent but was arousable.  PT OT recommending home health.  Anticipating discharging home in next 24 to 48 hours of lethargy and somnolence improves and renal function continues to improve.  Assessment and Plan:  AKI on CKD stage IV versus chronic progressive kidney disease -Likely secondary to prerenal azotemia in the setting of recent diarrhea, concomitant SGLT2 use, spironolactone, furosemide.   -Patient noted to have  started recently on Jardiance, approximately a month prior to admission.   -Patient admitted with acute AKI/ARF on chronic CKD stage IV with a creatinine noted on admission of 9.35. -Last creatinine noted on 07/16/2022 of 3.40. -BUN/Cr Trend: Recent Labs  Lab 08/30/22 1810 08/31/22 0115 08/31/22 0649 09/01/22 0845 09/02/22 0349 09/03/22 0143  BUN 114* 110* 112* 103* 91* 76*  CREATININE 9.35* 9.30* 8.95* 7.27* 6.11* 5.07*  -Urinalysis done with large  hemoglobin, 100 of protein, 11-20 WBCs. -Renal ultrasound done negative for hydronephrosis. - Intake/Output Summary (Last 24 hours) at 09/03/2022 1812 Last data filed at 09/03/2022 1659 Gross per 24 hour  Intake 870 ml  Output 100 ml  Net 770 ml    -Renal function improving daily probably suspects that we will continue to see improvement but as a baseline is fairly advanced already -Bicarb drip discontinued per nephrology yesterday. -Monitor urine output follow renal function. -Avoid Nephrotoxic Medications, Contrast Dyes, Hypotension and Dehydration to Ensure Adequate Renal Perfusion and will need to Renally Adjust Meds -Continue to Monitor and Trend Renal Function carefully and repeat CMP in the AM  -Nephrology following and appreciate input and recommendations and they are recommending to follow-up in the outpatient setting in 1 to 2 weeks and continue to hold spironolactone, Jardiance and Lasix and resume Lasix if he has edema. -PT/OT recommending home health   Metabolic Acidosis -Likely secondary to problem #1. -Patient with no signs or symptoms of infection. -Acidosis improved on bicarb drip.  Now CO2 is 22, anion gap is 9, chloride level is 100 level -Continue with sodium bicarb at 1300 mg p.o. 3 times daily -Bicarb drip discontinued per nephrology yesterday.   -Nephrology following appreciate further evaluation and assistance.     Diarrhea -Resolved.  Lethargy and Somnolence -Received Ambien last night -Arousable and answers questions appropriately but falls asleep easily -Ammonia Level was elevated a few days ago but improved and went from 60 -> 33 -If persist may consider obtaining ABG and TSH, RPR, B12   Ischemic Cardiomyopathy/CAD -EF 40 to 45% with severe MR. -Patient noted to be  dehydrated on admission and placed on IV fluids with gentle hydration.   -IV fluids discontinued yesterday  -Continue to hold diuretics of spironolactone and Lasix.   -Continue Aspirin 81  mg po daily and Atorvastatin 80 mg po Daily -C/w Toprol-XL at a reduced dose of 25 mg daily.     History of Paranoid Schizophrenia -Stable on home regimen of Olanzapine 5 mg po Daily and 15 mg po qHS, Risperidone 1 mg po qHS, and Sertraline 100 mg po Daily    History of right lower extremity DVT -Continue Anticoagulation with Apixaban .    Hypertension -Continue with Amlodipine 10 mg po Daily and Metoprolol Succinate 25 mg po daily -Continue to Monitor BP per Protocol -Last BP Reading was    Hyperlipidemia -Continue Atorvastatin 80 mg po Daily  Normocytic Anemia/Anemia of Chronic Kidney Disease Thrombocytopenia -Hgb/Hct and Platelet Count Trend: Recent Labs  Lab 08/31/22 0649 09/01/22 0845 09/02/22 0349 09/03/22 0143  HGB 11.3* 10.7* 10.9* 10.6*  HCT 34.6* 32.5* 32.7* 32.2*  MCV 88.9 89.0 84.9 86.8  PLT 91* 88* 88* 82*  -Check Anemia Panel in the AM -Continue to Monitor for S/Sx of Bleeding; No overt bleeding noted -Repeat CBC in the AM  Hypokalemia -Patient's K+ Level Trend: Recent Labs  Lab 08/30/22 1810 08/31/22 0115 08/31/22 0649 09/01/22 0845 09/02/22 0349 09/03/22 0143  K 4.9 3.9 3.8 3.1* 3.3* 3.4*  -Replete with po KCL 40 mEQ x1 -Continue to Monitor and Replete as Necessary -Repeat CMP in the AM   Hypoalbuminemia -Patient's Albumin Trend: Recent Labs  Lab 08/31/22 0115 09/01/22 0845 09/02/22 0349 09/03/22 0143  ALBUMIN 3.0* 2.8* 2.8* 2.8*  -Continue to Monitor and Trend and repeat CMP in the AM   DVT prophylaxis: apixaban (ELIQUIS) tablet 2.5 mg Start: 08/30/22 2200 apixaban (ELIQUIS) tablet 2.5 mg    Code Status: Full Code Family Communication: No family currently at bedside  Disposition Plan:  Level of care: Telemetry Medical Status is: Inpatient Remains inpatient appropriate because: Anticipating discharge in next 24 to 40 hours of lethargy, improving renal function continues to improve.   Consultants:  Nephrology  Procedures:  As  delineated as above  Antimicrobials:  Anti-infectives (From admission, onward)    None       Subjective: Seen and examined at bedside and he was somnolent and drowsy but easily arousable and would fall back to sleep very easily.  Answers questions appropriately and denies any pain.  Wanting to rest and states that he did not sleep very well last night.  No other concerns or complaints at this time.  Objective: Vitals:   09/02/22 2049 09/03/22 0541 09/03/22 0904 09/03/22 1609  BP: (!) 141/89 139/83 (!) 141/90 132/79  Pulse: 84 92 92 79  Resp: 17 18 18 18   Temp: (!) 97.4 F (36.3 C) 98.2 F (36.8 C) 98.6 F (37 C) 98 F (36.7 C)  TempSrc:    Oral  SpO2: 98% 99% 99% 100%  Weight:      Height:        Intake/Output Summary (Last 24 hours) at 09/03/2022 1821 Last data filed at 09/03/2022 1817 Gross per 24 hour  Intake 990 ml  Output 100 ml  Net 890 ml   Filed Weights   08/31/22 0608 09/02/22 0429  Weight: 83.2 kg 80.2 kg   Examination: Physical Exam:  Constitutional: WN/WD overweight chronically appearing African-American male in no acute distress appears sleepy Respiratory: Diminished to auscultation bilaterally, no wheezing, rales, rhonchi or crackles. Normal respiratory  effort and patient is not tachypenic. No accessory muscle use.  Unlabored breathing Cardiovascular: RRR, no murmurs / rubs / gallops. S1 and S2 auscultated.  Trace extremity edema Abdomen: Soft, non-tender, distended second to body habitus. Bowel sounds positive.  GU: Deferred. Musculoskeletal: No clubbing / cyanosis of digits/nails. No joint deformity upper and lower extremities.  Skin: No rashes, lesions, ulcers on limited skin evaluation. No induration; Warm and dry.  Neurologic: Somnolent and drowsy and does answer questions but falls back asleep easily Psychiatric: Sleepy but appears calm  Data Reviewed: I have personally reviewed following labs and imaging studies  CBC: Recent Labs  Lab  08/31/22 0649 09/01/22 0845 09/02/22 0349 09/03/22 0143  WBC 4.6 4.5 4.6 5.2  NEUTROABS 2.7  --  2.6  --   HGB 11.3* 10.7* 10.9* 10.6*  HCT 34.6* 32.5* 32.7* 32.2*  MCV 88.9 89.0 84.9 86.8  PLT 91* 88* 88* 82*   Basic Metabolic Panel: Recent Labs  Lab 08/31/22 0115 08/31/22 0649 09/01/22 0845 09/02/22 0349 09/03/22 0143  NA 141 140 141 144 142  K 3.9 3.8 3.1* 3.3* 3.4*  CL 116* 117* 114* 111 111  CO2 12* 12* 15* 21* 22  GLUCOSE 114* 90 76 84 79  BUN 110* 112* 103* 91* 76*  CREATININE 9.30* 8.95* 7.27* 6.11* 5.07*  CALCIUM 7.8* 7.7* 7.6* 7.9* 7.8*  PHOS 7.1*  --  5.7* 3.7 3.1   GFR: Estimated Creatinine Clearance: 14.5 mL/min (A) (by C-G formula based on SCr of 5.07 mg/dL (H)). Liver Function Tests: Recent Labs  Lab 08/31/22 0115 09/01/22 0845 09/02/22 0349 09/03/22 0143 09/03/22 1223  AST  --   --   --   --  17  ALT  --   --   --   --  16  ALKPHOS  --   --   --   --  120  BILITOT  --   --   --   --  0.6  PROT  --   --   --   --  6.2*  ALBUMIN 3.0* 2.8* 2.8* 2.8* 3.0*   No results for input(s): "LIPASE", "AMYLASE" in the last 168 hours. Recent Labs  Lab 08/30/22 1952 09/03/22 1223  AMMONIA 60* 33   Coagulation Profile: No results for input(s): "INR", "PROTIME" in the last 168 hours. Cardiac Enzymes: Recent Labs  Lab 08/30/22 1810  CKTOTAL 382   BNP (last 3 results) Recent Labs    07/14/22 0915  PROBNP 11,718*   HbA1C: No results for input(s): "HGBA1C" in the last 72 hours. CBG: No results for input(s): "GLUCAP" in the last 168 hours. Lipid Profile: No results for input(s): "CHOL", "HDL", "LDLCALC", "TRIG", "CHOLHDL", "LDLDIRECT" in the last 72 hours. Thyroid Function Tests: No results for input(s): "TSH", "T4TOTAL", "FREET4", "T3FREE", "THYROIDAB" in the last 72 hours. Anemia Panel: No results for input(s): "VITAMINB12", "FOLATE", "FERRITIN", "TIBC", "IRON", "RETICCTPCT" in the last 72 hours. Sepsis Labs: No results for input(s):  "PROCALCITON", "LATICACIDVEN" in the last 168 hours.  No results found for this or any previous visit (from the past 240 hour(s)).   Radiology Studies: No results found.  Scheduled Meds:  amLODipine  10 mg Oral Daily   apixaban  2.5 mg Oral BID   aspirin EC  81 mg Oral Daily   atorvastatin  80 mg Oral Daily   brimonidine  1 drop Both Eyes Daily   calcitRIOL  0.25 mcg Oral Q M,W,F   latanoprost  1 drop Both Eyes QHS  metoprolol succinate  25 mg Oral Daily   OLANZapine  15 mg Oral QHS   OLANZapine  5 mg Oral Daily   pantoprazole  80 mg Oral Q1200   risperiDONE  1 mg Oral QHS   sertraline  100 mg Oral Daily   sodium bicarbonate  1,300 mg Oral TID   Continuous Infusions:   LOS: 4 days   Marguerita Merles, DO [facility] Available via Epic secure chat 7am-7pm After these hours, please refer to coverage provider listed on [email or web address] 09/03/2022, 6:21 PM

## 2022-09-03 NOTE — Progress Notes (Signed)
Patient ID: Glenn Hawkins, male   DOB: 15-Nov-1955, 67 y.o.   MRN: [id number] S: No events overnight. Says eating fine. Feels well.  Cr trending down from 7.3 > 6.1 > 5.1.   O:BP (!) 141/90 (BP Location: Left Arm)   Pulse 92   Temp 98.6 F (37 C)   Resp 18   Ht 5\' 9"  (1.753 m)   Wt 80.2 kg   SpO2 99%   BMI 26.11 kg/m   Intake/Output Summary (Last 24 hours) at 09/03/2022 1109 Last data filed at 09/03/2022 1039 Gross per 24 hour  Intake 640 ml  Output 725 ml  Net -85 ml    Intake/Output: yest 0.5/0.725 Gen: NAD CVS: RRR Resp:CTA Abd: +BS, soft, NT/ND Ext: no edema  Recent Labs  Lab 08/30/22 1810 08/31/22 0115 08/31/22 0649 09/01/22 0845 09/02/22 0349 09/03/22 0143  NA 140 141 140 141 144 142  K 4.9 3.9 3.8 3.1* 3.3* 3.4*  CL 117* 116* 117* 114* 111 111  CO2 <7* 12* 12* 15* 21* 22  GLUCOSE 97 114* 90 76 84 79  BUN 114* 110* 112* 103* 91* 76*  CREATININE 9.35* 9.30* 8.95* 7.27* 6.11* 5.07*  ALBUMIN  --  3.0*  --  2.8* 2.8* 2.8*  CALCIUM 7.7* 7.8* 7.7* 7.6* 7.9* 7.8*  PHOS  --  7.1*  --  5.7* 3.7 3.1    Liver Function Tests: Recent Labs  Lab 09/01/22 0845 09/02/22 0349 09/03/22 0143  ALBUMIN 2.8* 2.8* 2.8*    No results for input(s): "LIPASE", "AMYLASE" in the last 168 hours. Recent Labs  Lab 08/30/22 1952  AMMONIA 60*    CBC: Recent Labs  Lab 08/31/22 0649 09/01/22 0845 09/02/22 0349 09/03/22 0143  WBC 4.6 4.5 4.6 5.2  NEUTROABS 2.7  --  2.6  --   HGB 11.3* 10.7* 10.9* 10.6*  HCT 34.6* 32.5* 32.7* 32.2*  MCV 88.9 89.0 84.9 86.8  PLT 91* 88* 88* 82*    Cardiac Enzymes: Recent Labs  Lab 08/30/22 1810  CKTOTAL 382    CBG: No results for input(s): "GLUCAP" in the last 168 hours.  Iron Studies: No results for input(s): "IRON", "TIBC", "TRANSFERRIN", "FERRITIN" in the last 72 hours. Studies/Results: No results found.  amLODipine  10 mg Oral Daily   apixaban  2.5 mg Oral BID   aspirin EC  81 mg Oral Daily   atorvastatin  80 mg Oral Daily    brimonidine  1 drop Both Eyes Daily   calcitRIOL  0.25 mcg Oral Q M,W,F   Daridorexant HCl  25 mg Oral QHS   latanoprost  1 drop Both Eyes QHS   metoprolol succinate  25 mg Oral Daily   OLANZapine  15 mg Oral QHS   OLANZapine  5 mg Oral Daily   pantoprazole  80 mg Oral Q1200   risperiDONE  1 mg Oral QHS   sertraline  100 mg Oral Daily   sodium bicarbonate  1,300 mg Oral TID    BMET    Component Value Date/Time   NA 142 09/03/2022 0143   NA 145 (H) 07/14/2022 0915   K 3.4 (L) 09/03/2022 0143   CL 111 09/03/2022 0143   CO2 22 09/03/2022 0143   GLUCOSE 79 09/03/2022 0143   BUN 76 (H) 09/03/2022 0143   BUN 49 (H) 07/14/2022 0915   CREATININE 5.07 (H) 09/03/2022 0143   CREATININE 4.47 (H) 11/22/2020 1348   CALCIUM 7.8 (L) 09/03/2022 0143   GFRNONAA 12 (L) 09/03/2022 0143  GFRAA 25 (L) 08/02/2019 1515   CBC    Component Value Date/Time   WBC 5.2 09/03/2022 0143   RBC 3.71 (L) 09/03/2022 0143   HGB 10.6 (L) 09/03/2022 0143   HGB 11.9 (L) 07/14/2022 0915   HCT 32.2 (L) 09/03/2022 0143   HCT 37.1 (L) 07/14/2022 0915   PLT 82 (L) 09/03/2022 0143   PLT 114 (L) 07/14/2022 0915   MCV 86.8 09/03/2022 0143   MCV 91 07/14/2022 0915   MCH 28.6 09/03/2022 0143   MCHC 32.9 09/03/2022 0143   RDW 15.7 (H) 09/03/2022 0143   RDW 16.0 (H) 07/14/2022 0915   LYMPHSABS 0.8 09/02/2022 0349   LYMPHSABS 1.6 08/02/2019 1515   MONOABS 0.5 09/02/2022 0349   EOSABS 0.7 (H) 09/02/2022 0349   EOSABS 0.1 08/02/2019 1515   BASOSABS 0.0 09/02/2022 0349   BASOSABS 0.0 08/02/2019 1515    Assessment/Plan:  AKI/CKD stage IV - hopefully this is ischemic ATN due to volume depletion due to diarrhea and concomitant SGLT-2 I use, fuorsemide, and spironolactone.  His Jardiance was started last month and may be the culprit, but progressive CKD to stage V is also possible.  Renal US w/o obstruction.  Urine Na low at 17 c/w volume depletion/pre-renal.  BUN/Cr slowly improving.  UOP improved.  Continue to  hold lasix, spironolactone, and Jardiance for now.  Electrolytes are stable, no indication for dialysis at this time.  Suspect will see continue to see improvement but his baseline if fairly advanced already.   Avoid nephrotoxic medications including NSAIDs and iodinated intravenous contrast exposure unless the latter is absolutely indicated.  Preferred narcotic agents for pain control are hydromorphone, fentanyl, and methadone. Morphine should not be used. Avoid Baclofen and avoid oral sodium phosphate and magnesium citrate based laxatives / bowel preps. Continue strict Input and Output monitoring. Will monitor the patient closely with you and intervene or adjust therapy as indicated by changes in clinical status/labs   Diarrhea - resolved ICMP - EF 40-45% and severe MR.  Appearing euvolemic, will hold IVF and diuretics for today.    Metabolic acidosis: in setting of AKI. Bicarb improving from < 7 to 22 today. Cont po bicarb. H/o paranoid schizophrenia - continue with home meds HTN - stable DM type 2 - per primary Iron deficiency anemia - Hb 10s.  No overt bleeding.  Continue to follow.   Hypokalemia - repleted  Dispo - ok with discharge at this point if he has close f/u with nephrologist Dr. [name] in next 1-2 weeks.  Would hold spironolactone, jardiance and lasix until f/u or instructions to resume lasix if edema.  Will follow peripherally, please reach out with concerns.   Estill Bakes MD [facility] Pager [phone]

## 2022-09-03 NOTE — Plan of Care (Signed)
Patient sitting in recliner. AO x 4 with some confusion. Explained to patient that Ambien may need to be held due to daytime drowsiness. Patient states that he will not be able to get any sleep without it. Will make PM nurse aware.

## 2022-09-03 NOTE — Progress Notes (Signed)
  Care Coordination Note  09/03/2022 Name: [name] MRN: [id number] DOB: April 07, 1955  Glenn Hawkins is a 67 y.o. year old male who is a primary care patient of Doreen Beam B, MD and is actively engaged with the care management team. I reached out to [name] by phone today to assist with  canceling   a follow up visit with the RN Case Manager  Follow up plan: Will call back to reschedule when discharged from hospital.   [facility] Coordination Care Guide  Direct Dial: [phone]

## 2022-09-03 NOTE — Plan of Care (Signed)
  Problem: Health Behavior/Discharge Planning: Goal: Ability to manage health-related needs will improve 09/03/2022 1737 by Aubery Lapping, RN Outcome: Progressing 09/03/2022 1733 by Aubery Lapping, RN Outcome: Progressing   Problem: Clinical Measurements: Goal: Ability to maintain clinical measurements within normal limits will improve 09/03/2022 1737 by Aubery Lapping, RN Outcome: Progressing 09/03/2022 1733 by Aubery Lapping, RN Outcome: Progressing   Problem: Clinical Measurements: Goal: Will remain free from infection 09/03/2022 1737 by Aubery Lapping, RN Outcome: Progressing 09/03/2022 1733 by Aubery Lapping, RN Outcome: Progressing   Problem: Clinical Measurements: Goal: Diagnostic test results will improve 09/03/2022 1737 by Aubery Lapping, RN Outcome: Progressing 09/03/2022 1733 by Aubery Lapping, RN Outcome: Progressing   Problem: Clinical Measurements: Goal: Respiratory complications will improve 09/03/2022 1737 by Aubery Lapping, RN Outcome: Progressing 09/03/2022 1733 by Aubery Lapping, RN Outcome: Progressing   Problem: Clinical Measurements: Goal: Cardiovascular complication will be avoided 09/03/2022 1737 by Aubery Lapping, RN Outcome: Progressing 09/03/2022 1733 by Aubery Lapping, RN Outcome: Progressing   Problem: Activity: Goal: Risk for activity intolerance will decrease 09/03/2022 1737 by Aubery Lapping, RN Outcome: Progressing 09/03/2022 1733 by Aubery Lapping, RN Outcome: Progressing   Problem: Nutrition: Goal: Adequate nutrition will be maintained 09/03/2022 1737 by Aubery Lapping, RN Outcome: Progressing 09/03/2022 1733 by Aubery Lapping, RN Outcome: Progressing   Problem: Coping: Goal: Level of anxiety will decrease 09/03/2022 1737 by Aubery Lapping, RN Outcome: Progressing 09/03/2022 1733 by Aubery Lapping, RN Outcome: Progressing   Problem: Elimination: Goal: Will not experience complications related to bowel motility 09/03/2022 1737 by Aubery Lapping,  RN Outcome: Progressing 09/03/2022 1733 by Aubery Lapping, RN Outcome: Progressing   Problem: Elimination: Goal: Will not experience complications related to urinary retention 09/03/2022 1737 by Aubery Lapping, RN Outcome: Progressing 09/03/2022 1733 by Aubery Lapping, RN Outcome: Progressing   Problem: Pain Managment: Goal: General experience of comfort will improve 09/03/2022 1737 by Aubery Lapping, RN Outcome: Progressing 09/03/2022 1733 by Aubery Lapping, RN Outcome: Progressing   Problem: Safety: Goal: Ability to remain free from injury will improve 09/03/2022 1737 by Aubery Lapping, RN Outcome: Progressing 09/03/2022 1733 by Aubery Lapping, RN Outcome: Progressing   Problem: Skin Integrity: Goal: Risk for impaired skin integrity will decrease 09/03/2022 1737 by Aubery Lapping, RN Outcome: Progressing 09/03/2022 1733 by Aubery Lapping, RN Outcome: Progressing

## 2022-09-04 ENCOUNTER — Inpatient Hospital Stay (HOSPITAL_COMMUNITY): Payer: Medicare Other

## 2022-09-04 DIAGNOSIS — F200 Paranoid schizophrenia: Secondary | ICD-10-CM | POA: Diagnosis not present

## 2022-09-04 DIAGNOSIS — I10 Essential (primary) hypertension: Secondary | ICD-10-CM | POA: Diagnosis not present

## 2022-09-04 DIAGNOSIS — I82411 Acute embolism and thrombosis of right femoral vein: Secondary | ICD-10-CM | POA: Diagnosis not present

## 2022-09-04 DIAGNOSIS — N179 Acute kidney failure, unspecified: Secondary | ICD-10-CM | POA: Diagnosis not present

## 2022-09-04 DIAGNOSIS — R5383 Other fatigue: Secondary | ICD-10-CM

## 2022-09-04 LAB — CBC WITH DIFFERENTIAL/PLATELET
Abs Immature Granulocytes: 0.04 10*3/uL (ref 0.00–0.07)
Basophils Absolute: 0 10*3/uL (ref 0.0–0.1)
Basophils Relative: 1 %
Eosinophils Absolute: 0.7 10*3/uL — ABNORMAL HIGH (ref 0.0–0.5)
Eosinophils Relative: 13 %
HCT: 32.9 % — ABNORMAL LOW (ref 39.0–52.0)
Hemoglobin: 10.6 g/dL — ABNORMAL LOW (ref 13.0–17.0)
Immature Granulocytes: 1 %
Lymphocytes Relative: 20 %
Lymphs Abs: 1.1 10*3/uL (ref 0.7–4.0)
MCH: 28.3 pg (ref 26.0–34.0)
MCHC: 32.2 g/dL (ref 30.0–36.0)
MCV: 87.7 fL (ref 80.0–100.0)
Monocytes Absolute: 0.5 10*3/uL (ref 0.1–1.0)
Monocytes Relative: 8 %
Neutro Abs: 3.1 10*3/uL (ref 1.7–7.7)
Neutrophils Relative %: 57 %
Platelets: 72 10*3/uL — ABNORMAL LOW (ref 150–400)
RBC: 3.75 MIL/uL — ABNORMAL LOW (ref 4.22–5.81)
RDW: 15.8 % — ABNORMAL HIGH (ref 11.5–15.5)
WBC: 5.5 10*3/uL (ref 4.0–10.5)
nRBC: 0 % (ref 0.0–0.2)

## 2022-09-04 LAB — FOLATE: Folate: 5.3 ng/mL — ABNORMAL LOW (ref >5.9)

## 2022-09-04 LAB — COMPREHENSIVE METABOLIC PANEL
ALT: 17 U/L (ref 0–44)
AST: 21 U/L (ref 15–41)
Albumin: 2.8 g/dL — ABNORMAL LOW (ref 3.5–5.0)
Alkaline Phosphatase: 101 U/L (ref 38–126)
Anion gap: 8 (ref 5–15)
BUN: 65 mg/dL — ABNORMAL HIGH (ref 8–23)
CO2: 21 mmol/L — ABNORMAL LOW (ref 22–32)
Calcium: 7.9 mg/dL — ABNORMAL LOW (ref 8.9–10.3)
Chloride: 110 mmol/L (ref 98–111)
Creatinine, Ser: 4.32 mg/dL — ABNORMAL HIGH (ref 0.61–1.24)
GFR, Estimated: 14 mL/min — ABNORMAL LOW (ref >60)
Glucose, Bld: 85 mg/dL (ref 70–99)
Potassium: 3.7 mmol/L (ref 3.5–5.1)
Sodium: 139 mmol/L (ref 135–145)
Total Bilirubin: 0.5 mg/dL (ref 0.3–1.2)
Total Protein: 5.6 g/dL — ABNORMAL LOW (ref 6.5–8.1)

## 2022-09-04 LAB — IRON AND TIBC
Iron: 36 ug/dL — ABNORMAL LOW (ref 45–182)
Saturation Ratios: 18 % (ref 17.9–39.5)
TIBC: 202 ug/dL — ABNORMAL LOW (ref 250–450)
UIBC: 166 ug/dL

## 2022-09-04 LAB — PHOSPHORUS: Phosphorus: 2.5 mg/dL (ref 2.5–4.6)

## 2022-09-04 LAB — RETICULOCYTES
Immature Retic Fract: 14.3 % (ref 2.3–15.9)
RBC.: 3.73 MIL/uL — ABNORMAL LOW (ref 4.22–5.81)
Retic Count, Absolute: 41.4 10*3/uL (ref 19.0–186.0)
Retic Ct Pct: 1.1 % (ref 0.4–3.1)

## 2022-09-04 LAB — FERRITIN: Ferritin: 281 ng/mL (ref 24–336)

## 2022-09-04 LAB — VITAMIN B12: Vitamin B-12: 417 pg/mL (ref 180–914)

## 2022-09-04 LAB — MAGNESIUM: Magnesium: 1.1 mg/dL — ABNORMAL LOW (ref 1.7–2.4)

## 2022-09-04 MED ORDER — POTASSIUM CHLORIDE CRYS ER 20 MEQ PO TBCR
40.0000 meq | EXTENDED_RELEASE_TABLET | Freq: Once | ORAL | Status: AC
  Administered 2022-09-04: 40 meq via ORAL
  Filled 2022-09-04: qty 2

## 2022-09-04 MED ORDER — POLYSACCHARIDE IRON COMPLEX 150 MG PO CAPS
150.0000 mg | ORAL_CAPSULE | Freq: Every day | ORAL | Status: DC
  Administered 2022-09-04 – 2022-09-08 (×5): 150 mg via ORAL
  Filled 2022-09-04 (×5): qty 1

## 2022-09-04 MED ORDER — MAGNESIUM SULFATE 4 GM/100ML IV SOLN: 4.0000 g | Freq: Once | INTRAVENOUS | Status: DC

## 2022-09-04 MED ORDER — FOLIC ACID 1 MG PO TABS
1.0000 mg | ORAL_TABLET | Freq: Every day | ORAL | Status: DC
  Administered 2022-09-04 – 2022-09-08 (×5): 1 mg via ORAL
  Filled 2022-09-04 (×5): qty 1

## 2022-09-04 MED ORDER — MAGNESIUM SULFATE 50 % IJ SOLN: 3.0000 g | Freq: Once | INTRAVENOUS | Status: DC

## 2022-09-04 MED ORDER — MAGNESIUM SULFATE 4 GM/100ML IV SOLN
4.0000 g | Freq: Once | INTRAVENOUS | Status: AC
  Administered 2022-09-04: 4 g via INTRAVENOUS
  Filled 2022-09-04: qty 100

## 2022-09-04 NOTE — TOC Progression Note (Signed)
Transition of Care [facility]) - Initial/Assessment Note    Patient Details  Name: Glenn Hawkins MRN: [id number] Date of Birth: 1955/10/06  [facility] CM/SW Contact:    Ralene Bathe, LCSW Phone Number: 09/04/2022, 2:13 PM  Clinical Narrative:                 LCSW notified that OT is recommending short term at SNF for the patient.  LCSW met with the patient at bedside to discuss recommendation.  The patient expressed an understanding but declined SNF placement at this time.  The patient reports that he is ready to go home.  LCSW inquired about whether the patient has assistance at home.  The patient is adamant that he can care for himself or find someone to assist.  The patient is agreeable to home health services.  RNCM updated.  TOC following.   Expected Discharge Plan: Home w Home Health Services Barriers to Discharge: Continued Medical Work up   Patient Goals and CMS Choice Patient states their goals for this hospitalization and ongoing recovery are:: To return home CMS Medicare.gov Compare Post Acute Care list provided to:: Patient Choice offered to / list presented to : Patient      Expected Discharge Plan and Services   Discharge Planning Services: CM Consult Post Acute Care Choice: Home Health Living arrangements for the past 2 months: Single Family Home                 DME Arranged: N/A DME Agency: NA       HH Arranged: PT, OT   Date HH Agency Contacted: 09/02/22 Time HH Agency Contacted: 1535    Prior Living Arrangements/Services Living arrangements for the past 2 months: Single Family Home Lives with:: Self Patient language and need for interpreter reviewed:: Yes Do you feel safe going back to the place where you live?: Yes      Need for Family Participation in Patient Care: Yes (Comment) Care giver support system in place?: Yes (comment) Current home services: DME (Cane, walker, scale) Criminal Activity/Legal Involvement Pertinent to Current  Situation/Hospitalization: No - Comment as needed  Activities of Daily Living Home Assistive Devices/Equipment: Walker (specify type) ADL Screening (condition at time of admission) Patient's cognitive ability adequate to safely complete daily activities?: Yes Is the patient deaf or have difficulty hearing?: No Does the patient have difficulty seeing, even when wearing glasses/contacts?: No Does the patient have difficulty concentrating, remembering, or making decisions?: No Patient able to express need for assistance with ADLs?: Yes Does the patient have difficulty dressing or bathing?: No Independently performs ADLs?: Yes (appropriate for developmental age) Does the patient have difficulty walking or climbing stairs?: No Weakness of Legs: None Weakness of Arms/Hands: None  Permission Sought/Granted Permission sought to share information with : Facility Medical sales representative, Family Supports Permission granted to share information with : Yes, Verbal Permission Granted              Emotional Assessment Appearance:: Appears stated age Attitude/Demeanor/Rapport: Engaged, Gracious Affect (typically observed): Accepting, Appropriate, Calm, Hopeful, Pleasant Orientation: : Oriented to Self, Oriented to Place, Oriented to  Time, Oriented to Situation Alcohol / Substance Use: Not Applicable Psych Involvement: No (comment)  Admission diagnosis:  AKI (acute kidney injury) (HCC) [N17.9] Patient Active Problem List   Diagnosis Date Noted   AKI (acute kidney injury) (HCC) 08/30/2022   Severe mitral regurgitation 07/16/2022   Acute on chronic systolic heart failure (HCC) 07/03/2022   DOE (dyspnea on  exertion) 05/16/2022   Combined systolic and diastolic congestive heart failure (HCC) 05/06/2022   Acute respiratory failure with hypoxia (HCC) 05/06/2022   Elevated troponin 05/06/2022   Iron deficiency anemia 05/06/2022   History of DVT (deep vein thrombosis) 05/06/2022   Chronic  anticoagulation 05/06/2022   Thrombocytopenia (HCC) 05/06/2022   Paranoid schizophrenia (HCC) 05/06/2022   Unintentional weight loss 11/08/2020   Pancreatic lesion 11/08/2020   Increased ammonia level 11/08/2020   Abdominal pain 11/08/2020   Diarrhea 11/08/2020   Indigestion 11/08/2020   Abnormality of abdominal aorta 11/08/2020   Hyperlipidemia LDL goal <70    Hemorrhoids 04/18/2015   Colon polyps 04/18/2015   Rectal bleed 11/08/2014   CKD (chronic kidney disease) stage 3, GFR 30-59 ml/min (HCC) 11/08/2014   Hyperglycemia 11/08/2014   GERD without esophagitis 11/08/2014   Obesity 11/08/2014   Rectal bleeding 11/08/2014   Acute deep vein thrombosis (DVT) of femoral vein of right lower extremity (HCC) 10/18/2014   Chronic systolic heart failure (HCC) 12/09/2013   Cardiomyopathy, ischemic 06/07/2013   Mitral valve disorder    Coronary atherosclerosis of native coronary artery    Hypertension    Hyperlipidemia    PCP:  [name], MD Pharmacy:   [facility] [phone] - [place], [place] - [address] AT [facility] [address] [place] [address] Phone: [phone] Fax: [phone]  [facility] - [place], [place] - [address] [place] [address] Phone: [phone] Fax: [phone]  [facility] - [place], [place] - [address] [place] [address] Phone: [phone] Fax: [phone]  [name] [address] [place] [address] Phone: [phone] Fax: [phone]     Social Determinants of Health (SDOH) Social History: SDOH Screenings   Food Insecurity: No Food Insecurity (08/30/2022)  Housing: Low Risk  (08/30/2022)  Transportation Needs: No Transportation Needs (08/30/2022)  Utilities: Not At Risk (08/30/2022)  Financial Resource Strain: Low Risk  (07/18/2022)   Physical Activity: Inactive (08/07/2022)  Social Connections: Unknown (10/11/2021)   Received from [facility]  Tobacco Use: Medium Risk (08/30/2022)   SDOH Interventions: Transportation Interventions: Intervention Not Indicated, Inpatient TOC, Patient Resources (Friends/Family)   Readmission Risk Interventions    09/02/2022    3:42 PM  Readmission Risk Prevention Plan  Transportation Screening Complete  PCP or Specialist Appt within 3-5 Days Complete  HRI or Home Care Consult Complete  Social Work Consult for Recovery Care Planning/Counseling Complete  Palliative Care Screening Not Applicable  Medication Review Oceanographer) Referral to Pharmacy

## 2022-09-04 NOTE — Progress Notes (Signed)
PROGRESS NOTE    CLYDE ZARRELLA  ZOX:[id number] DOB: November 07, 1955 DOA: 08/30/2022 PCP: [name], MD   Brief Narrative:  The patient is a 67 year old chronically ill-appearing African-American male with a past medical history significant for but limited to hypertension, diabetes mellitus type 2, hyperlipidemia, CAD, history of chronic systolic CHF and ischemic cardiomyopathy, history of severe mitral regurgitation, schizophrenia, iron deficiency anemia, CKD stage IV as well as other comorbidities who presented to [facility] facility with a 2-week history of fatigue, anorexia and intermittent diarrhea.  He was seen in the ED and saturations were noted to be 99%.  Chest x-ray was done within normal limits.  Lab work was showing a bicarbonate level of 13, BUN of 114 and creatinine of 9.77.  Mag level is low and he is scheduled to [name] for further evaluation management of AKI on CKD stage IV.    Nephrology was consulted and patient was hydrated.  Renal function continues to improve daily.  Yesterday he is little bit somnolent but was arousable and again was sleepy but able to interact appropriately.  PT OT was recommending home health but Sister says he does not have 24/7 care that PT is recommending. PT/OT now recommending SNF. Patient does not want to go to SNF however. Anticipating discharging home in next 24 to 48 hours of lethargy and somnolence improves and renal function continues to improve and if Safe Discharge disposition can be determined.   Assessment and Plan:  AKI on CKD stage IV versus chronic progressive kidney disease -Likely secondary to prerenal azotemia in the setting of recent diarrhea, concomitant SGLT2 use, spironolactone, furosemide.   -Patient noted to have  started recently on Jardiance, approximately a month prior to admission.   -Patient admitted with acute AKI/ARF on chronic CKD stage IV with a creatinine noted on admission of 9.35. -Last creatinine noted on  07/16/2022 of 3.40. -BUN/Cr Trend: Recent Labs  Lab 08/30/22 1810 08/31/22 0115 08/31/22 0649 09/01/22 0845 09/02/22 0349 09/03/22 0143 09/04/22 0155  BUN 114* 110* 112* 103* 91* 76* 65*  CREATININE 9.35* 9.30* 8.95* 7.27* 6.11* 5.07* 4.32*  -Urinalysis done with large hemoglobin, 100 of protein, 11-20 WBCs. -Renal ultrasound done negative for hydronephrosis. - Intake/Output Summary (Last 24 hours) at 09/04/2022 1448 Last data filed at 09/04/2022 0800 Gross per 24 hour  Intake 1310 ml  Output 0 ml  Net 1310 ml   -Renal function improving daily but Nephrology probably suspects that we will continue to see improvement but as a baseline is fairly advanced already -Bicarb drip discontinued per nephrology yesterday. -Monitor urine output follow renal function. -Avoid Nephrotoxic Medications, Contrast Dyes, Hypotension and Dehydration to Ensure Adequate Renal Perfusion and will need to Renally Adjust Meds -Continue to Monitor and Trend Renal Function carefully and repeat CMP in the AM  -Nephrology following and appreciate input and recommendations and they are recommending to follow-up in the outpatient setting in 1 to 2 weeks and continue to hold spironolactone, Jardiance and Lasix and resume Lasix if he has edema. -PT/OT recommending home health but he needs 24/7 per PT/OT and sister says he does not have that so PT/OT re-evaluated and Recommending SNF but patient is refusing    Metabolic Acidosis -Likely secondary to problem #1. -Patient with no signs or symptoms of infection. -Acidosis improved on bicarb drip but slightly dropped.  Now CO2 is 21, anion gap is 8, chloride level is 110 level -Continue with sodium bicarb at 1300 mg p.o. 3 times daily -  Bicarb drip discontinued per nephrology yesterday.   -Nephrology following appreciate further evaluation and assistance.     Diarrhea -Resolved.  Lethargy and Somnolence, improving -Received Ambien last night -Arousable and answers  questions appropriately but falls asleep easily -Ammonia Level was elevated a few days ago but improved and went from 60 -> 33 -If persist may consider obtaining ABG and TSH, RPR, B12   Ischemic Cardiomyopathy/CAD -EF 40 to 45% with severe MR. -Patient noted to be dehydrated on admission and placed on IV fluids with gentle hydration.   -IV fluids discontinued -Continue to hold diuretics of spironolactone and Lasix.   -Continue Aspirin 81 mg po daily and Atorvastatin 80 mg po Daily -C/w Toprol-XL at a reduced dose of 25 mg daily.   -CXR done and showed "There is increased density in right parahilar region suggesting possible pneumonia. Less likely possibility would be asymmetric pulmonary edema. Increased markings are seen in the left lower lung field which may suggest crowding of bronchovascular structures due to elevation of left hemidiaphragm or atelectasis/pneumonia." -Will provide Flutter Valve and Incentive Spirometry   History of Paranoid Schizophrenia -Stable on home regimen of Olanzapine 5 mg po Daily and 15 mg po qHS, Risperidone 1 mg po qHS, and Sertraline 100 mg po Daily    History of Right Lower Extremity DVT -Continue Anticoagulation with Apixaban .    Hypertension -Continue with Amlodipine 10 mg po Daily and Metoprolol Succinate 25 mg po daily -Continue to Monitor BP per Protocol -Last BP Reading was 146/83   Hyperlipidemia -Continue Atorvastatin 80 mg po Daily  Hypomagnesemia -Patient's Mag Level Trend: Recent Labs  Lab 09/04/22 0155  MG 1.1*  -Replete with IV Mag Sulfate 4 grams -Continue to Monitor and Replete as Necessary -Repeat Mag in the AM    Normocytic Anemia/Anemia of Chronic Kidney Disease Thrombocytopenia -Hgb/Hct and Platelet Count Trend: Recent Labs  Lab 08/31/22 0649 09/01/22 0845 09/02/22 0349 09/03/22 0143 09/04/22 0155  HGB 11.3* 10.7* 10.9* 10.6* 10.6*  HCT 34.6* 32.5* 32.7* 32.2* 32.9*  MCV 88.9 89.0 84.9 86.8 87.7  PLT 91* 88* 88*  82* 72*  -Checked Anemia Panel showed an iron level of 36, UIBC of 166, TIBC of 202, saturation ratios of 18%, ferritin low 281, folate of 5.3, and vitamin B12 417 -Will start folic acid supplement and iron supplements -Continue to Monitor for S/Sx of Bleeding; No overt bleeding noted -Repeat CBC in the AM  Hypokalemia -Patient's K+ Level Trend: Recent Labs  Lab 08/30/22 1810 08/31/22 0115 08/31/22 0649 09/01/22 0845 09/02/22 0349 09/03/22 0143 09/04/22 0155  K 4.9 3.9 3.8 3.1* 3.3* 3.4* 3.7  -Replete with po KCL 40 mEQ x1 -Continue to Monitor and Replete as Necessary -Repeat CMP in the AM   Hypoalbuminemia -Patient's Albumin Trend: Recent Labs  Lab 08/31/22 0115 09/01/22 0845 09/02/22 0349 09/03/22 0143 09/03/22 1223 09/04/22 0155  ALBUMIN 3.0* 2.8* 2.8* 2.8* 3.0* 2.8*  -Continue to Monitor and Trend and repeat CMP in the AM   DVT prophylaxis: apixaban (ELIQUIS) tablet 2.5 mg Start: 08/30/22 2200 apixaban (ELIQUIS) tablet 2.5 mg    Code Status: Full Code Family Communication: No family present at bedside   Disposition Plan:  Level of care: Telemetry Medical Status is: Inpatient Remains inpatient appropriate because: Getting Electrolyte Repletement and will need to determine Safe discharge disposition for the patient.   Consultants:  Nephrology  Procedures:  As delineated as above  Antimicrobials:  Anti-infectives (From admission, onward)    None  Subjective: Seen and examined at bedside and he is feeling a little bit sleepy again but more arousable.  Denies any chest pain or shortness of breath.  No nausea or vomiting.  Feels okay.  No other concerns at present this time.  Objective: Vitals:   09/03/22 2049 09/04/22 0436 09/04/22 0500 09/04/22 0757  BP: (!) 146/82 (!) 143/87  (!) 146/83  Pulse: 78 79  81  Resp: 20 19    Temp: 98 F (36.7 C) 98.3 F (36.8 C)    TempSrc: Oral Oral    SpO2: 100% 98%    Weight:   81.4 kg   Height:         Intake/Output Summary (Last 24 hours) at 09/04/2022 1524 Last data filed at 09/04/2022 0800 Gross per 24 hour  Intake 1310 ml  Output 0 ml  Net 1310 ml   Filed Weights   08/31/22 0608 09/02/22 0429 09/04/22 0500  Weight: 83.2 kg 80.2 kg 81.4 kg   Examination: Physical Exam:  Constitutional: WN/WD overweight African-American male in no acute distress appears calm and a little somnolent and lethargic but easily arousable now Respiratory: Diminished to auscultation bilaterally, no wheezing, rales, rhonchi or crackles unlabored breathing. Normal respiratory effort and patient is not tachypenic. No accessory muscle use.  Not wearing any supplemental oxygen via nasal cannula Cardiovascular: RRR, no murmurs / rubs / gallops. S1 and S2 auscultated.  Minimal extremity edema Abdomen: Soft, non-tender, distended secondary to body habitus. Bowel sounds positive.  GU: Deferred. Musculoskeletal: No clubbing / cyanosis of digits/nails. No joint deformity upper and lower extremities.   Skin: No rashes, lesions, ulcers on limited skin evaluation. No induration; Warm and dry.  Neurologic: He is a little sleepy and lethargic but 1 fully awake CN 2-12 grossly intact with no focal deficits. Romberg sign and cerebellar reflexes not assessed.  Psychiatric: Appears calm  Data Reviewed: I have personally reviewed following labs and imaging studies  CBC: Recent Labs  Lab 08/31/22 0649 09/01/22 0845 09/02/22 0349 09/03/22 0143 09/04/22 0155  WBC 4.6 4.5 4.6 5.2 5.5  NEUTROABS 2.7  --  2.6  --  3.1  HGB 11.3* 10.7* 10.9* 10.6* 10.6*  HCT 34.6* 32.5* 32.7* 32.2* 32.9*  MCV 88.9 89.0 84.9 86.8 87.7  PLT 91* 88* 88* 82* 72*   Basic Metabolic Panel: Recent Labs  Lab 08/31/22 0115 08/31/22 0649 09/01/22 0845 09/02/22 0349 09/03/22 0143 09/04/22 0155  NA 141 140 141 144 142 139  K 3.9 3.8 3.1* 3.3* 3.4* 3.7  CL 116* 117* 114* 111 111 110  CO2 12* 12* 15* 21* 22 21*  GLUCOSE 114* 90 76 84 79  85  BUN 110* 112* 103* 91* 76* 65*  CREATININE 9.30* 8.95* 7.27* 6.11* 5.07* 4.32*  CALCIUM 7.8* 7.7* 7.6* 7.9* 7.8* 7.9*  MG  --   --   --   --   --  1.1*  PHOS 7.1*  --  5.7* 3.7 3.1 2.5   GFR: Estimated Creatinine Clearance: 17 mL/min (A) (by C-G formula based on SCr of 4.32 mg/dL (H)). Liver Function Tests: Recent Labs  Lab 09/01/22 0845 09/02/22 0349 09/03/22 0143 09/03/22 1223 09/04/22 0155  AST  --   --   --  17 21  ALT  --   --   --  16 17  ALKPHOS  --   --   --  120 101  BILITOT  --   --   --  0.6 0.5  PROT  --   --   --  6.2* 5.6*  ALBUMIN 2.8* 2.8* 2.8* 3.0* 2.8*   No results for input(s): "LIPASE", "AMYLASE" in the last 168 hours. Recent Labs  Lab 08/30/22 1952 09/03/22 1223  AMMONIA 60* 33   Coagulation Profile: No results for input(s): "INR", "PROTIME" in the last 168 hours. Cardiac Enzymes: Recent Labs  Lab 08/30/22 1810  CKTOTAL 382   BNP (last 3 results) Recent Labs    07/14/22 0915  PROBNP 11,718*   HbA1C: No results for input(s): "HGBA1C" in the last 72 hours. CBG: No results for input(s): "GLUCAP" in the last 168 hours. Lipid Profile: No results for input(s): "CHOL", "HDL", "LDLCALC", "TRIG", "CHOLHDL", "LDLDIRECT" in the last 72 hours. Thyroid Function Tests: No results for input(s): "TSH", "T4TOTAL", "FREET4", "T3FREE", "THYROIDAB" in the last 72 hours. Anemia Panel: Recent Labs    09/04/22 0155  VITAMINB12 417  FOLATE 5.3*  FERRITIN 281  TIBC 202*  IRON 36*  RETICCTPCT 1.1   Sepsis Labs: No results for input(s): "PROCALCITON", "LATICACIDVEN" in the last 168 hours.  No results found for this or any previous visit (from the past 240 hour(s)).   Radiology Studies: DG CHEST PORT 1 VIEW  Result Date: 09/04/2022 CLINICAL DATA:  Shortness of breath EXAM: PORTABLE CHEST 1 VIEW COMPARISON:  Previous studies including the examination of 08/30/2022 FINDINGS: Transverse diameter of heart is increased. There is marked elevation of left  hemidiaphragm. Increased density is seen in right parahilar region. There is crowding of markings in left lower lung field. There is no significant pleural effusion or pneumothorax. IMPRESSION: There is increased density in right parahilar region suggesting possible pneumonia. Less likely possibility would be asymmetric pulmonary edema. Increased markings are seen in the left lower lung field which may suggest crowding of bronchovascular structures due to elevation of left hemidiaphragm or atelectasis/pneumonia. Electronically Signed   By: Ernie Avena M.D.   On: 09/04/2022 07:57    Scheduled Meds:  amLODipine  10 mg Oral Daily   apixaban  2.5 mg Oral BID   aspirin EC  81 mg Oral Daily   atorvastatin  80 mg Oral Daily   brimonidine  1 drop Both Eyes Daily   calcitRIOL  0.25 mcg Oral Q M,W,F   folic acid  1 mg Oral Daily   iron polysaccharides  150 mg Oral Daily   latanoprost  1 drop Both Eyes QHS   metoprolol succinate  25 mg Oral Daily   OLANZapine  15 mg Oral QHS   OLANZapine  5 mg Oral Daily   pantoprazole  80 mg Oral Q1200   potassium chloride  40 mEq Oral Once   risperiDONE  1 mg Oral QHS   sertraline  100 mg Oral Daily   sodium bicarbonate  1,300 mg Oral TID   Continuous Infusions:   LOS: 5 days   Marguerita Merles, DO [facility] Available via Epic secure chat 7am-7pm After these hours, please refer to coverage provider listed on [email or web address] 09/04/2022, 3:24 PM

## 2022-09-04 NOTE — Progress Notes (Signed)
Physical Therapy Treatment Patient Details Name: Glenn Hawkins MRN: [id number] DOB: July 28, 1955 Today's Date: 09/04/2022   History of Present Illness Pt is a 67 y/o male presenting on 7/6 transferred from [facility] with AKI on stage IV CKD. PMH includes: CKD, cdiff, HTN, DM2, chronic CHF, ischemic cardiomyopathy, MI, PAD, schizophrenia, severe mitral regurgitation, iron deficiency anemia.    PT Comments  Pt received in chair, agreeable to therapy session and with good participation and improved tolerance for standing/gait training and seated/standing exercises this date. Pt needing up to min guard for safety with functional mobility tasks (using cane) but steadier with RW and recommend he continue to use RW at home as he reports increased R foot pain today and RW helps to reduce pain and thus decrease his fall risk. Pt with poor insight into deficits and fall risk but oriented today and able to state some things he needs to be careful of with his CHF diagnosis, pt receptive to instruction. Pt continues to benefit from PT services to progress toward functional mobility goals, disposition updated below to include short term moderate intensity post-acute rehab as family reports they cannot provide consistent 24/7 supervision/assist upon DC and at current level, pt is at increased risk of falls/functional decline living alone unless frequent assist available. Discussed with Supervising PT Doreene Nest and updated below. If pt could arrange for >20 hours/day of supervision/assist with iADLs, he would be appropriate for HHPT.      Assistance Recommended at Discharge Frequent or constant Supervision/Assistance  If plan is discharge home, recommend the following:  Can travel by private vehicle    A little help with walking and/or transfers;A little help with bathing/dressing/bathroom;Assist for transportation;Help with stairs or ramp for entrance;Assistance with cooking/housework   Yes  Equipment  Recommendations  None recommended by PT    Recommendations for Other Services       Precautions / Restrictions Precautions Precautions: Fall Restrictions Weight Bearing Restrictions: No     Mobility  Bed Mobility               General bed mobility comments: received up in chair    Transfers Overall transfer level: Needs assistance Equipment used: Straight cane, Rolling walker (2 wheels) Transfers: Sit to/from Stand Sit to Stand: Supervision           General transfer comment: close supervision for safety, pt needs to use BUE to push up to achieve upright.    Ambulation/Gait Ambulation/Gait assistance: Supervision, Min guard Gait Distance (Feet): 150 Feet (x2 with seated break ~5 mins between to recover) Assistive device: Straight cane, Rolling walker (2 wheels) Gait Pattern/deviations: Step-through pattern, Decreased step length - right, Decreased step length - left, Decreased stride length, Decreased weight shift to right, Antalgic       General Gait Details: Pt moderately reliant on cane for first trial, then needed seated break to recover and agreeable to use RW for second trial due to increased c/o R foot pain, pt steadier with RW needing Supervision for safety, good RW management. HR 90 bpm with exertion and 80's bpm while seated. No other acute s/sx distress other than c/o moderate R foot pain.   Stairs Stairs:  (offered to have pt practice curb steps but he defers)           Wheelchair Mobility     Tilt Bed    Modified Rankin (Stroke Patients Only)       Balance Overall balance assessment: Needs assistance Sitting-balance support: No upper  extremity supported, Feet supported Sitting balance-Leahy Scale: Good     Standing balance support: During functional activity, Bilateral upper extremity supported, Single extremity supported Standing balance-Leahy Scale: Fair Standing balance comment: steadier with RW due to c/o R foot pain; fair  balance with cane                            Cognition Arousal/Alertness: Awake/alert Behavior During Therapy: Flat affect Overall Cognitive Status: Impaired/Different from baseline Area of Impairment: Attention, Memory, Following commands, Safety/judgement, Awareness, Problem solving                   Current Attention Level: Sustained Memory: Decreased recall of precautions, Decreased short-term memory Following Commands: Follows one step commands consistently, Follows one step commands with increased time, Follows multi-step commands inconsistently Safety/Judgement: Decreased awareness of safety, Decreased awareness of deficits Awareness: Emergent Problem Solving: Slow processing, Requires verbal cues, Decreased initiation, Difficulty sequencing General Comments: Pt oriented x4 today.  Follows simple commands but continues to demonstrate difficulty with simple problem solving. Pt was able to state need to weigh himself daily when asked about CHF but needed reminders for how many pounds change from day to day or week to week would necessitate a call to MD/visit to hospital. Pt reports he eats a low salt diet but didn't mention Meals on Wheels service to PTA during session.        Exercises Other Exercises Other Exercises: seated BLE AROM: LAQ, hip flexion x10 reps ea Other Exercises: STS x 5 reps using BUE and rest breaks 10-30 seconds between reps    General Comments General comments (skin integrity, edema, etc.): No dypsnea on exertion, HR WFL. Pt states 5/10 modified RPE (Fatigue) at end of session.      Pertinent Vitals/Pain Pain Assessment Pain Assessment: Faces Faces Pain Scale: Hurts little more Pain Location: R foot (bottom of foot), L knee (chronic intermittent L knee pain per pt) Pain Descriptors / Indicators: Discomfort, Grimacing, Sore Pain Intervention(s): Limited activity within patient's tolerance     PT Goals (current goals can now be found  in the care plan section) Acute Rehab PT Goals Patient Stated Goal: To go home PT Goal Formulation: With patient Time For Goal Achievement: 09/16/22 Progress towards PT goals: Progressing toward goals    Frequency    Min 3X/week      PT Plan Discharge plan needs to be updated       AM-PAC PT "6 Clicks" Mobility   Outcome Measure  Help needed turning from your back to your side while in a flat bed without using bedrails?: None Help needed moving from lying on your back to sitting on the side of a flat bed without using bedrails?: A Little (without rail) Help needed moving to and from a bed to a chair (including a wheelchair)?: A Little Help needed standing up from a chair using your arms (e.g., wheelchair or bedside chair)?: A Little Help needed to walk in hospital room?: A Little Help needed climbing 3-5 steps with a railing? : A Lot 6 Click Score: 18    End of Session Equipment Utilized During Treatment: Gait belt Activity Tolerance: Patient tolerated treatment well Patient left: in chair;with call bell/phone within reach;with chair alarm set Nurse Communication: Mobility status;Other (comment) (pt receptive to discussion on SNF but states he prefers HHPT upon DC, he would like more info on personal care assistance services for home) PT Visit Diagnosis: Unsteadiness  on feet (R26.81);Muscle weakness (generalized) (M62.81);Other abnormalities of gait and mobility (R26.89)     Time: 4098-1191 PT Time Calculation (min) (ACUTE ONLY): 23 min  Charges:    $Gait Training: 8-22 mins $Therapeutic Exercise: 8-22 mins PT General Charges $$ ACUTE PT VISIT: 1 Visit                     Lateisha Thurlow P., PTA Acute Rehabilitation Services Secure Chat Preferred 9a-5:30pm Office: [phone]    Dorathy Kinsman [facility] 09/04/2022, 3:39 PM

## 2022-09-04 NOTE — Progress Notes (Signed)
Occupational Therapy Treatment Patient Details Name: Glenn Hawkins MRN: [id number] DOB: 06-Jan-1956 Today's Date: 09/04/2022   History of present illness Pt is a 67 y/o male presenting on 7/6 transferred from [facility] with AKI on stage IV CKD. PMH includes: CKD, cdiff, HTN, DM2, chronic CHF, ischemic cardiomyopathy, MI, PAD, schizophrenia, severe mitral regurgitation, iron deficiency anemia.   OT comments  Patient supine in bed and asleep upon entry.  Awakens to tactile stimuli, and increases alertness after washing his face and ambulating to bathroom.  Patient continues to demonstrate mild instability, needing min guard to close supervision for mobility and transfers.  He is able to complete ADLs with close supervision. Cognitively, pt oriented today, following simple commands but continues to demonstrated decreased short term memory, decreased awareness and problem solving.  Updated dc plan to inpatient setting with >3hours/day, unless pt can have initial 24/7 support at dc.  Will follow.    Recommendations for follow up therapy are one component of a multi-disciplinary discharge planning process, led by the attending physician.  Recommendations may be updated based on patient status, additional functional criteria and insurance authorization.    Assistance Recommended at Discharge Frequent or constant Supervision/Assistance  Patient can return home with the following  A little help with walking and/or transfers;A little help with bathing/dressing/bathroom;Assistance with cooking/housework;Direct supervision/assist for medications management;Direct supervision/assist for financial management;Assist for transportation   Equipment Recommendations  None recommended by OT    Recommendations for Other Services      Precautions / Restrictions Precautions Precautions: Fall Restrictions Weight Bearing Restrictions: No       Mobility Bed Mobility Overal bed mobility: Modified  Independent             General bed mobility comments: no assist required    Transfers Overall transfer level: Needs assistance Equipment used: None Transfers: Sit to/from Stand Sit to Stand: Supervision           General transfer comment: close supervision for safety     Balance Overall balance assessment: Needs assistance Sitting-balance support: No upper extremity supported, Feet supported Sitting balance-Leahy Scale: Good Sitting balance - Comments: Able to sit EOB without UE support   Standing balance support: No upper extremity supported, During functional activity, Single extremity supported Standing balance-Leahy Scale: Fair Standing balance comment: mild unsteadiness, has cane but not using it- even when cued to do so, typically just carrying                           ADL either performed or assessed with clinical judgement   ADL Overall ADL's : Needs assistance/impaired     Grooming: Supervision/safety;Standing;Wash/dry hands               Lower Body Dressing: Supervision/safety;Sit to/from stand   Toilet Transfer: Supervision/safety;Ambulation   Toileting- Clothing Manipulation and Hygiene: Supervision/safety;Sit to/from stand       Functional mobility during ADLs: Supervision/safety;Min guard;Cane      Extremity/Trunk Assessment Upper Extremity Assessment Upper Extremity Assessment: Generalized weakness            Vision       Perception     Praxis      Cognition Arousal/Alertness: Awake/alert Behavior During Therapy: Flat affect Overall Cognitive Status: Impaired/Different from baseline Area of Impairment: Attention, Memory, Following commands, Safety/judgement, Awareness, Problem solving                   Current Attention Level: Sustained Memory:  Decreased recall of precautions, Decreased short-term memory Following Commands: Follows one step commands consistently, Follows one step commands with  increased time, Follows multi-step commands inconsistently Safety/Judgement: Decreased awareness of safety, Decreased awareness of deficits Awareness: Emergent Problem Solving: Slow processing, Requires verbal cues, Decreased initiation, Difficulty sequencing General Comments: pt asleep upon entry, alertness improved significantly with mobility.  Oriented today.  Follows simple commands but continues to demonstrate difficulty with simple problem solving and sequencing tasks.  Pt with significantly slowed counting 0-10 when walking compared to sitting on EOB, and sitting EOB unable to count backwards without multiple mistakes (and stopping halfway). Question if this is patients baseline? as he reports having meals on wheels and using alexa to remind him to take his meds but he does report driving.        Exercises      Shoulder Instructions       General Comments      Pertinent Vitals/ Pain       Pain Assessment Pain Assessment: No/denies pain  Home Living                                          Prior Functioning/Environment              Frequency  Min 2X/week        Progress Toward Goals  OT Goals(current goals can now be found in the care plan section)  Progress towards OT goals: Progressing toward goals  Acute Rehab OT Goals Patient Stated Goal: get home OT Goal Formulation: With patient Time For Goal Achievement: 09/15/22 Potential to Achieve Goals: Good  Plan Frequency remains appropriate;Discharge plan needs to be updated    Co-evaluation                 AM-PAC OT "6 Clicks" Daily Activity     Outcome Measure   Help from another person eating meals?: None Help from another person taking care of personal grooming?: A Little Help from another person toileting, which includes using toliet, bedpan, or urinal?: A Little Help from another person bathing (including washing, rinsing, drying)?: A Little Help from another person to put  on and taking off regular upper body clothing?: A Little Help from another person to put on and taking off regular lower body clothing?: A Little 6 Click Score: 19    End of Session    OT Visit Diagnosis: Other abnormalities of gait and mobility (R26.89);Muscle weakness (generalized) (M62.81);Other symptoms and signs involving cognitive function   Activity Tolerance Patient tolerated treatment well   Patient Left with call bell/phone within reach;in chair;with chair alarm set   Nurse Communication Mobility status        Time: 4098-1191 OT Time Calculation (min): 22 min  Charges: OT General Charges $OT Visit: 1 Visit OT Treatments $Self Care/Home Management : 8-22 mins  Barry Brunner, OT Acute Rehabilitation Services Office [phone]   Chancy Milroy 09/04/2022, 1:43 PM

## 2022-09-04 NOTE — Progress Notes (Addendum)
Physical Therapy Treatment Patient Details Name: Glenn Hawkins MRN: [id number] DOB: 1955-10-06 Today's Date: 09/04/2022   History of Present Illness Pt is a 67 y/o male presenting on 7/6 transferred from [facility] with AKI on stage IV CKD. PMH includes: CKD, cdiff, HTN, DM2, chronic CHF, ischemic cardiomyopathy, MI, PAD, schizophrenia, severe mitral regurgitation, iron deficiency anemia.    PT Comments  Pt received sitting EOB attempting to get up unassisted, pt agreeable to additional therapy session as RN busy at this time, pt eager to use bathroom. Pt needing up to Supervision for transfers and mostly Supervision, some min guard A for RW management in narrow spaces and for teachback on improved posture, RW use for R foot pain relief. Chair alarm on for safety at end of session. Pt continues to benefit from PT services to progress toward functional mobility goals.      Assistance Recommended at Discharge Frequent or constant Supervision/Assistance  If plan is discharge home, recommend the following:  Can travel by private vehicle    A little help with walking and/or transfers;A little help with bathing/dressing/bathroom;Assist for transportation;Help with stairs or ramp for entrance;Assistance with cooking/housework   Yes  Equipment Recommendations  None recommended by PT    Recommendations for Other Services       Precautions / Restrictions Precautions Precautions: Fall Restrictions Weight Bearing Restrictions: No     Mobility  Bed Mobility               General bed mobility comments: received sitting EOB, getting up.    Transfers Overall transfer level: Needs assistance Equipment used: Rolling walker (2 wheels) Transfers: Sit to/from Stand Sit to Stand: Supervision           General transfer comment: close supervision for safety, pt needs to use BUE to push up to achieve upright.    Ambulation/Gait Ambulation/Gait assistance: Supervision, Min  guard Gait Distance (Feet): 30 Feet (57ft to toilet, standing then 79ft) Assistive device: Rolling walker (2 wheels) Gait Pattern/deviations: Step-through pattern, Decreased step length - right, Decreased step length - left, Decreased stride length, Decreased weight shift to right, Antalgic       General Gait Details: mod cues for proximity to RW due to R foot pain for optimal benefit to offload RLE, pt needs reinforcement, tending to hold it too far away from body   Stairs Stairs:  (offered to have pt practice curb steps but he defers)           Wheelchair Mobility     Tilt Bed    Modified Rankin (Stroke Patients Only)       Balance Overall balance assessment: Needs assistance Sitting-balance support: No upper extremity supported, Feet supported Sitting balance-Leahy Scale: Good     Standing balance support: During functional activity, Bilateral upper extremity supported, Single extremity supported Standing balance-Leahy Scale: Fair Standing balance comment: steadier with RW due to c/o R foot pain                            Cognition Arousal/Alertness: Awake/alert Behavior During Therapy: Flat affect Overall Cognitive Status: Impaired/Different from baseline Area of Impairment: Attention, Memory, Following commands, Safety/judgement, Awareness, Problem solving                   Current Attention Level: Sustained Memory: Decreased recall of precautions, Decreased short-term memory Following Commands: Follows one step commands consistently, Follows one step commands with increased time, Follows multi-step  commands inconsistently Safety/Judgement: Decreased awareness of safety, Decreased awareness of deficits Awareness: Emergent Problem Solving: Slow processing, Requires verbal cues, Decreased initiation, Difficulty sequencing General Comments: Pt oriented x4 today.  Follows simple commands but continues to demonstrate difficulty with simple problem  solving. Pt getting up without using call bell for toileting, reinforced importance of call bell usage with pt.        Exercises Other Exercises Other Exercises: seated BLE AROM: LAQ, hip flexion x10 reps ea Other Exercises: STS x 5 reps using BUE and rest breaks 10-30 seconds between reps    General Comments General comments (skin integrity, edema, etc.): VSS per chart review, no acute s/sx distress      Pertinent Vitals/Pain Pain Assessment Pain Assessment: Faces Faces Pain Scale: Hurts little more Pain Location: R foot (bottom of foot), L knee (chronic intermittent L knee pain per pt) Pain Descriptors / Indicators: Discomfort, Grimacing, Sore Pain Intervention(s): Limited activity within patient's tolerance, Monitored during session, Repositioned    Home Living                          Prior Function            PT Goals (current goals can now be found in the care plan section) Acute Rehab PT Goals Patient Stated Goal: To go home PT Goal Formulation: With patient Time For Goal Achievement: 09/16/22 Progress towards PT goals: Progressing toward goals    Frequency    Min 3X/week      PT Plan Current plan remains appropriate    Co-evaluation              AM-PAC PT "6 Clicks" Mobility   Outcome Measure  Help needed turning from your back to your side while in a flat bed without using bedrails?: None Help needed moving from lying on your back to sitting on the side of a flat bed without using bedrails?: A Little (without rail) Help needed moving to and from a bed to a chair (including a wheelchair)?: A Little Help needed standing up from a chair using your arms (e.g., wheelchair or bedside chair)?: A Little Help needed to walk in hospital room?: A Little Help needed climbing 3-5 steps with a railing? : A Lot 6 Click Score: 18    End of Session Equipment Utilized During Treatment: Gait belt Activity Tolerance: Patient tolerated treatment  well;Patient limited by fatigue (second session) Patient left: in chair;with call bell/phone within reach;with chair alarm set Nurse Communication: Mobility status;Other (comment) (keep alarm on; pt impulsive; R foot pain) PT Visit Diagnosis: Unsteadiness on feet (R26.81);Muscle weakness (generalized) (M62.81);Other abnormalities of gait and mobility (R26.89)     Time: 9562-1308 PT Time Calculation (min) (ACUTE ONLY): 10 min  Charges:    $Gait Training: 8-22 mins PT General Charges $$ ACUTE PT VISIT: 1 Visit                     Tayson Schnelle P., PTA Acute Rehabilitation Services Secure Chat Preferred 9a-5:30pm Office: [phone]    Dorathy Kinsman [facility] 09/04/2022, 7:44 PM

## 2022-09-05 ENCOUNTER — Inpatient Hospital Stay (HOSPITAL_COMMUNITY): Payer: Medicare Other

## 2022-09-05 ENCOUNTER — Encounter: Payer: Self-pay | Admitting: *Deleted

## 2022-09-05 DIAGNOSIS — M25561 Pain in right knee: Secondary | ICD-10-CM

## 2022-09-05 DIAGNOSIS — N179 Acute kidney failure, unspecified: Secondary | ICD-10-CM | POA: Diagnosis not present

## 2022-09-05 DIAGNOSIS — I82411 Acute embolism and thrombosis of right femoral vein: Secondary | ICD-10-CM | POA: Diagnosis not present

## 2022-09-05 DIAGNOSIS — F200 Paranoid schizophrenia: Secondary | ICD-10-CM | POA: Diagnosis not present

## 2022-09-05 DIAGNOSIS — I10 Essential (primary) hypertension: Secondary | ICD-10-CM | POA: Diagnosis not present

## 2022-09-05 LAB — CBC WITH DIFFERENTIAL/PLATELET
Abs Immature Granulocytes: 0.03 10*3/uL (ref 0.00–0.07)
Basophils Absolute: 0 10*3/uL (ref 0.0–0.1)
Basophils Relative: 1 %
Eosinophils Absolute: 0.7 10*3/uL — ABNORMAL HIGH (ref 0.0–0.5)
Eosinophils Relative: 11 %
HCT: 32 % — ABNORMAL LOW (ref 39.0–52.0)
Hemoglobin: 10.4 g/dL — ABNORMAL LOW (ref 13.0–17.0)
Immature Granulocytes: 0 %
Lymphocytes Relative: 16 %
Lymphs Abs: 1 10*3/uL (ref 0.7–4.0)
MCH: 28.1 pg (ref 26.0–34.0)
MCHC: 32.5 g/dL (ref 30.0–36.0)
MCV: 86.5 fL (ref 80.0–100.0)
Monocytes Absolute: 0.6 10*3/uL (ref 0.1–1.0)
Monocytes Relative: 8 %
Neutro Abs: 4.3 10*3/uL (ref 1.7–7.7)
Neutrophils Relative %: 64 %
Platelets: 70 10*3/uL — ABNORMAL LOW (ref 150–400)
RBC: 3.7 MIL/uL — ABNORMAL LOW (ref 4.22–5.81)
RDW: 15.4 % (ref 11.5–15.5)
WBC: 6.7 10*3/uL (ref 4.0–10.5)
nRBC: 0 % (ref 0.0–0.2)

## 2022-09-05 LAB — COMPREHENSIVE METABOLIC PANEL
ALT: 17 U/L (ref 0–44)
AST: 20 U/L (ref 15–41)
Albumin: 2.8 g/dL — ABNORMAL LOW (ref 3.5–5.0)
Alkaline Phosphatase: 101 U/L (ref 38–126)
Anion gap: 9 (ref 5–15)
BUN: 53 mg/dL — ABNORMAL HIGH (ref 8–23)
CO2: 23 mmol/L (ref 22–32)
Calcium: 8.4 mg/dL — ABNORMAL LOW (ref 8.9–10.3)
Chloride: 108 mmol/L (ref 98–111)
Creatinine, Ser: 3.78 mg/dL — ABNORMAL HIGH (ref 0.61–1.24)
GFR, Estimated: 17 mL/min — ABNORMAL LOW (ref >60)
Glucose, Bld: 86 mg/dL (ref 70–99)
Potassium: 3.9 mmol/L (ref 3.5–5.1)
Sodium: 140 mmol/L (ref 135–145)
Total Bilirubin: 0.6 mg/dL (ref 0.3–1.2)
Total Protein: 5.7 g/dL — ABNORMAL LOW (ref 6.5–8.1)

## 2022-09-05 LAB — PHOSPHORUS: Phosphorus: 2 mg/dL — ABNORMAL LOW (ref 2.5–4.6)

## 2022-09-05 LAB — MAGNESIUM: Magnesium: 1.8 mg/dL (ref 1.7–2.4)

## 2022-09-05 MED ORDER — PREDNISONE 50 MG PO TABS
60.0000 mg | ORAL_TABLET | Freq: Every day | ORAL | Status: DC
  Administered 2022-09-05 – 2022-09-07 (×3): 60 mg via ORAL
  Filled 2022-09-05 (×3): qty 1

## 2022-09-05 MED ORDER — MAGNESIUM SULFATE 2 GM/50ML IV SOLN
2.0000 g | Freq: Once | INTRAVENOUS | Status: AC
  Administered 2022-09-05: 2 g via INTRAVENOUS
  Filled 2022-09-05: qty 50

## 2022-09-05 MED ORDER — K PHOS MONO-SOD PHOS DI & MONO 155-852-130 MG PO TABS
250.0000 mg | ORAL_TABLET | Freq: Once | ORAL | Status: AC
  Administered 2022-09-05: 250 mg via ORAL
  Filled 2022-09-05: qty 1

## 2022-09-05 NOTE — TOC Progression Note (Signed)
Transition of Care [facility]) - Initial/Assessment Note    Patient Details  Name: Glenn Hawkins MRN: [id number] Date of Birth: [birth year]  Transition of Care [facility]) CM/SW Contact:    Ralene Bathe, LCSW Phone Number: 09/05/2022, 11:15 AM  Clinical Narrative:                 RE: Glenn Hawkins Date of Birth:  1955-04-14  Date: 09/05/2022  Please be advised that the above-named patient will require a short-term nursing home stay - anticipated 30 days or less for rehabilitation and strengthening.  The plan is for return home.  Expected Discharge Plan: Home w Home Health Services Barriers to Discharge: Continued Medical Work up   Patient Goals and CMS Choice Patient states their goals for this hospitalization and ongoing recovery are:: To return home CMS Medicare.gov Compare Post Acute Care list provided to:: Patient Choice offered to / list presented to : Patient      Expected Discharge Plan and Services   Discharge Planning Services: CM Consult Post Acute Care Choice: Home Health Living arrangements for the past 2 months: Single Family Home                 DME Arranged: N/A DME Agency: NA       HH Arranged: PT, OT   Date HH Agency Contacted: 09/02/22 Time HH Agency Contacted: [phone]    Prior Living Arrangements/Services Living arrangements for the past 2 months: Single Family Home Lives with:: Self Patient language and need for interpreter reviewed:: Yes Do you feel safe going back to the place where you live?: Yes      Need for Family Participation in Patient Care: Yes (Comment) Care giver support system in place?: Yes (comment) Current home services: DME (Cane, walker, scale) Criminal Activity/Legal Involvement Pertinent to Current Situation/Hospitalization: No - Comment as needed  Activities of Daily Living Home Assistive Devices/Equipment: Walker (specify type) ADL Screening (condition at time of admission) Patient's cognitive ability adequate to safely complete daily  activities?: Yes Is the patient deaf or have difficulty hearing?: No Does the patient have difficulty seeing, even when wearing glasses/contacts?: No Does the patient have difficulty concentrating, remembering, or making decisions?: No Patient able to express need for assistance with ADLs?: Yes Does the patient have difficulty dressing or bathing?: No Independently performs ADLs?: Yes (appropriate for developmental age) Does the patient have difficulty walking or climbing stairs?: No Weakness of Legs: None Weakness of Arms/Hands: None  Permission Sought/Granted Permission sought to share information with : Facility Medical sales representative, Family Supports Permission granted to share information with : Yes, Verbal Permission Granted              Emotional Assessment Appearance:: Appears stated age Attitude/Demeanor/Rapport: Engaged, Gracious Affect (typically observed): Accepting, Appropriate, Calm, Hopeful, Pleasant Orientation: : Oriented to Self, Oriented to Place, Oriented to  Time, Oriented to Situation Alcohol / Substance Use: Not Applicable Psych Involvement: No (comment)  Admission diagnosis:  AKI (acute kidney injury) (HCC) [N17.9] Patient Active Problem List   Diagnosis Date Noted   AKI (acute kidney injury) (HCC) 08/30/2022   Severe mitral regurgitation 07/16/2022   Acute on chronic systolic heart failure (HCC) 07/03/2022   DOE (dyspnea on exertion) 05/16/2022   Combined systolic and diastolic congestive heart failure (HCC) 05/06/2022   Acute respiratory failure with hypoxia (HCC) 05/06/2022   Elevated troponin 05/06/2022   Iron deficiency anemia 05/06/2022   History of DVT (deep vein thrombosis) 05/06/2022   Chronic anticoagulation 05/06/2022  Thrombocytopenia (HCC) 05/06/2022   Paranoid schizophrenia (HCC) 05/06/2022   Unintentional weight loss 11/08/2020   Pancreatic lesion 11/08/2020   Increased ammonia level 11/08/2020   Abdominal pain 11/08/2020    Diarrhea 11/08/2020   Indigestion 11/08/2020   Abnormality of abdominal aorta 11/08/2020   Hyperlipidemia LDL goal <70    Hemorrhoids 04/18/2015   Colon polyps 04/18/2015   Rectal bleed 11/08/2014   CKD (chronic kidney disease) stage 3, GFR 30-59 ml/min (HCC) 11/08/2014   Hyperglycemia 11/08/2014   GERD without esophagitis 11/08/2014   Obesity 11/08/2014   Rectal bleeding 11/08/2014   Acute deep vein thrombosis (DVT) of femoral vein of right lower extremity (HCC) 10/18/2014   Chronic systolic heart failure (HCC) 12/09/2013   Cardiomyopathy, ischemic 06/07/2013   Mitral valve disorder    Coronary atherosclerosis of native coronary artery    Hypertension    Hyperlipidemia    PCP:  [name], MD Pharmacy:   [facility] [phone] - [place], [place] - [address] AT [facility] [address] [place] [address] Phone: [phone] Fax: [phone]  [facility] - [place], [place] - [address] [place] [address] Phone: [phone] Fax: [phone]  [facility] - [place], [place] - [address] [facility] [address] [place] [address] Phone: [phone] Fax: [phone]  [name] [address] [place] [address] Phone: [phone] Fax: [phone]     Social Determinants of Health (SDOH) Social History: SDOH Screenings   Food Insecurity: No Food Insecurity (08/30/2022)  Housing: Low Risk  (08/30/2022)  Transportation Needs: No Transportation Needs (08/30/2022)  Utilities: Not At Risk (08/30/2022)  Financial Resource Strain: Low Risk  (07/18/2022)  Physical Activity: Inactive (08/07/2022)  Social Connections: Unknown (10/11/2021)   Received from [facility]  Tobacco Use: Medium Risk (08/30/2022)   SDOH Interventions: Transportation Interventions: Intervention Not Indicated, Inpatient TOC,  Patient Resources (Friends/Family)   Readmission Risk Interventions    09/02/2022    3:42 PM  Readmission Risk Prevention Plan  Transportation Screening Complete  PCP or Specialist Appt within 3-5 Days Complete  HRI or Home Care Consult Complete  Social Work Consult for Recovery Care Planning/Counseling Complete  Palliative Care Screening Not Applicable  Medication Review Oceanographer) Referral to Pharmacy

## 2022-09-05 NOTE — Progress Notes (Signed)
PROGRESS NOTE    Glenn Hawkins  NFA:[id number] DOB: 06-03-1955 DOA: 08/30/2022 PCP: [name], MD   Brief Narrative:  The patient is a 67 year old chronically ill-appearing African-American male with a past medical history significant for but limited to hypertension, diabetes mellitus type 2, hyperlipidemia, CAD, history of chronic systolic CHF and ischemic cardiomyopathy, history of severe mitral regurgitation, schizophrenia, iron deficiency anemia, CKD stage IV as well as other comorbidities who presented to [facility] facility with a 2-week history of fatigue, anorexia and intermittent diarrhea.  He was seen in the ED and saturations were noted to be 99%.  Chest x-ray was done within normal limits.  Lab work was showing a bicarbonate level of 13, BUN of 114 and creatinine of 9.77.  Mag level is low and he is scheduled to [name] for further evaluation management of AKI on CKD stage IV.    Nephrology was consulted and patient was hydrated.  Renal function continues to improve daily.  Yesterday he is little bit somnolent but was arousable and again was sleepy but able to interact appropriately.  PT OT was recommending home health but Sister says he does not have 24/7 care that PT is recommending. PT/OT now recommending SNF. Patient does not want to go to SNF however he has changed his mind and agreeable to Rehab.   Assessment and Plan:  AKI on CKD stage IV versus chronic progressive kidney disease, improving  -Likely secondary to prerenal azotemia in the setting of recent diarrhea, concomitant SGLT2 use, spironolactone, furosemide.   -Patient noted to have  started recently on Jardiance, approximately a month prior to admission.   -Patient admitted with acute AKI/ARF on chronic CKD stage IV with a creatinine noted on admission of 9.35. -Last creatinine noted on 07/16/2022 of 3.40. -BUN/Cr Trend: Recent Labs  Lab 08/31/22 0115 08/31/22 0649 09/01/22 0845 09/02/22 0349 09/03/22 0143  09/04/22 0155 09/05/22 0447  BUN 110* 112* 103* 91* 76* 65* 53*  CREATININE 9.30* 8.95* 7.27* 6.11* 5.07* 4.32* 3.78*  -Urinalysis done with large hemoglobin, 100 of protein, 11-20 WBCs. -Renal ultrasound done negative for hydronephrosis. - Intake/Output Summary (Last 24 hours) at 09/05/2022 1430 Last data filed at 09/05/2022 1300 Gross per 24 hour  Intake 920 ml  Output --  Net 920 ml   -Renal function improving daily but Nephrology probably suspects that we will continue to see improvement but as a baseline is fairly advanced already -Bicarb drip discontinued per nephrology yesterday. -Monitor urine output follow renal function. -Avoid Nephrotoxic Medications, Contrast Dyes, Hypotension and Dehydration to Ensure Adequate Renal Perfusion and will need to Renally Adjust Meds -Continue to Monitor and Trend Renal Function carefully and repeat CMP in the AM  -Nephrology following and appreciate input and recommendations and they are recommending to follow-up in the outpatient setting in 1 to 2 weeks and continue to hold spironolactone, Jardiance and Lasix and resume Lasix if he has edema. -PT/OT recommending home health but he needs 24/7 per PT/OT and sister says he does not have that so PT/OT re-evaluated and Recommending SNF and patient is now agreeable    Metabolic Acidosis, improved  -Likely secondary to problem #1. -Patient with no signs or symptoms of infection. -Acidosis improved on bicarb drip but slightly dropped.  Now CO2 is 23, anion gap is 9, chloride level is 108 level -Continue with sodium bicarb at 1300 mg p.o. 3 times daily -Bicarb drip discontinued per nephrology yesterday.   -Nephrology following appreciate further evaluation and assistance.  Diarrhea -Resolved.  Lethargy and Somnolence, improved -Received Ambien last night -Arousable and answers questions appropriately but falls asleep easily -Ammonia Level was elevated a few days ago but improved and went from 60  -> 33 -If persist may consider obtaining ABG and TSH, RPR, B12   Ischemic Cardiomyopathy/CAD -EF 40 to 45% with severe MR. -Patient noted to be dehydrated on admission and placed on IV fluids with gentle hydration.   -IV fluids discontinued -Continue to hold diuretics of spironolactone and Lasix.   -Continue Aspirin 81 mg po daily and Atorvastatin 80 mg po Daily -C/w Toprol-XL at a reduced dose of 25 mg daily.   -CXR done and showed "There is increased density in right parahilar region suggesting possible pneumonia. Less likely possibility would be asymmetric pulmonary edema. Increased markings are seen in the left lower lung field which may suggest crowding of bronchovascular structures due to elevation of left hemidiaphragm or atelectasis/pneumonia." -Will provide Flutter Valve and Incentive Spirometry   History of Paranoid Schizophrenia -Stable on home regimen of Olanzapine 5 mg po Daily and 15 mg po qHS, Risperidone 1 mg po qHS, and Sertraline 100 mg po Daily    History of Right Lower Extremity DVT -Continue Anticoagulation with Apixaban .    Hypertension -Continue with Amlodipine 10 mg po Daily and Metoprolol Succinate 25 mg po daily -Continue to Monitor BP per Protocol -Last BP Reading was 150/81   Hyperlipidemia -Continue Atorvastatin 80 mg po Daily  Hypophosphatemia -Phos Level Trend: Recent Labs  Lab 08/31/22 0115 09/01/22 0845 09/02/22 0349 09/03/22 0143 09/04/22 0155 09/05/22 0447  PHOS 7.1* 5.7* 3.7 3.1 2.5 2.0*  -Replete with po K Phos 250 mg x 1 -Continue to Monitor and Replete as Necessary  Left Knee Pain -Start Prednisone Taper -Concern for Gout but Right anterior knee is tender to palpate and swollen -Obtained DG X-Ray of the the Knee and showed "No acute findings are seen in the bony structures of left knee.There is no significant effusion. There is soft tissue swelling anterior to the proximal tibia at the attachment of infrapatellar tendon suggesting  bursitis or tendinosis.Arteriosclerosis." -Check Uric Acid Level in the AM -Order Ice  -Will consider Ortho Evaluation if not improving   Hypomagnesemia -Patient's Mag Level Trend: Recent Labs  Lab 09/04/22 0155 09/05/22 0447  MG 1.1* 1.8  -Replete with IV Mag Sulfate 4 grams yesterday and will replete with IV Mag Sulfate 2 grams today  -Continue to Monitor and Replete as Necessary -Repeat Mag in the AM    Normocytic Anemia/Anemia of Chronic Kidney Disease Thrombocytopenia -Hgb/Hct and Platelet Count Trend: Recent Labs  Lab 08/31/22 0649 09/01/22 0845 09/02/22 0349 09/03/22 0143 09/04/22 0155 09/05/22 0447  HGB 11.3* 10.7* 10.9* 10.6* 10.6* 10.4*  HCT 34.6* 32.5* 32.7* 32.2* 32.9* 32.0*  MCV 88.9 89.0 84.9 86.8 87.7 86.5  PLT 91* 88* 88* 82* 72* 70*  -Checked Anemia Panel showed an iron level of 36, UIBC of 166, TIBC of 202, saturation ratios of 18%, ferritin low 281, folate of 5.3, and vitamin B12 417 -Will start folic acid supplement and iron supplements -Continue to Monitor for S/Sx of Bleeding; No overt bleeding noted -Repeat CBC in the AM  Hypokalemia -Patient's K+ Level Trend: Recent Labs  Lab 08/31/22 0115 08/31/22 0649 09/01/22 0845 09/02/22 0349 09/03/22 0143 09/04/22 0155 09/05/22 0447  K 3.9 3.8 3.1* 3.3* 3.4* 3.7 3.9  -Continue to Monitor and Replete as Necessary -Repeat CMP in the AM   Hypoalbuminemia -Patient's Albumin Trend:  Recent Labs  Lab 08/31/22 0115 09/01/22 0845 09/02/22 0349 09/03/22 0143 09/03/22 1223 09/04/22 0155 09/05/22 0447  ALBUMIN 3.0* 2.8* 2.8* 2.8* 3.0* 2.8* 2.8*  -Continue to Monitor and Trend and repeat CMP in the AM   DVT prophylaxis: apixaban (ELIQUIS) tablet 2.5 mg Start: 08/30/22 2200 apixaban (ELIQUIS) tablet 2.5 mg    Code Status: Full Code Family Communication: No family present at bedside   Disposition Plan:  Level of care: Telemetry Medical Status is: Inpatient Remains inpatient appropriate because:  Needs SNF   Consultants:  Nephrology  Procedures:  As delineated as above  Antimicrobials:  Anti-infectives (From admission, onward)    None       Subjective: Seen and examined at bedside and he is much more awake and alert today but complaining of some right knee pain.  States that he had several doctors in the past before and thinks his knee is having another gout flare.  He is exquisitely tender on the bursa.  No appreciable effusion noted.  He denies any other concerns at present time and thinks he is doing fairly well.  Objective: Vitals:   09/04/22 1618 09/04/22 2018 09/05/22 0508 09/05/22 0813  BP: 138/77 (!) 140/80 (!) 146/83 (!) 150/81  Pulse: 80 86 87 91  Resp: 17 18 16 16   Temp: 98 F (36.7 C) 98.8 F (37.1 C) 98 F (36.7 C) 98.1 F (36.7 C)  TempSrc:      SpO2: 100% 100% 98% 99%  Weight:      Height:        Intake/Output Summary (Last 24 hours) at 09/05/2022 1433 Last data filed at 09/05/2022 1300 Gross per 24 hour  Intake 920 ml  Output --  Net 920 ml   Filed Weights   08/31/22 0608 09/02/22 0429 09/04/22 0500  Weight: 83.2 kg 80.2 kg 81.4 kg   Examination: Physical Exam:  Constitutional: WN/WD overweight African-American male in no acute distress but complaining about some right knee pain Respiratory: Diminished to auscultation bilaterally, no wheezing, rales, rhonchi or crackles. Normal respiratory effort and patient is not tachypenic. No accessory muscle use.  Unlabored breathing Cardiovascular: RRR, no murmurs / rubs / gallops. S1 and S2 auscultated.  Minimal extremity swelling but has some tenderness and mild fluctuance over the left bursa of his knee Abdomen: Soft, non-tender, distended secondary to body habitus. Bowel sounds positive.  GU: Deferred. Musculoskeletal: No clubbing / cyanosis of digits/nails. No joint deformity upper and lower extremities.  Skin: No rashes, lesions, ulcers on a limited skin evaluation. No induration; Warm and dry.   Neurologic: CN 2-12 grossly intact with no focal deficits. Romberg sign and cerebellar reflexes not assessed.  Psychiatric: Normal judgment and insight. Alert and oriented x 3. Normal mood and appropriate affect.   Data Reviewed: I have personally reviewed following labs and imaging studies  CBC: Recent Labs  Lab 08/31/22 0649 09/01/22 0845 09/02/22 0349 09/03/22 0143 09/04/22 0155 09/05/22 0447  WBC 4.6 4.5 4.6 5.2 5.5 6.7  NEUTROABS 2.7  --  2.6  --  3.1 4.3  HGB 11.3* 10.7* 10.9* 10.6* 10.6* 10.4*  HCT 34.6* 32.5* 32.7* 32.2* 32.9* 32.0*  MCV 88.9 89.0 84.9 86.8 87.7 86.5  PLT 91* 88* 88* 82* 72* 70*   Basic Metabolic Panel: Recent Labs  Lab 09/01/22 0845 09/02/22 0349 09/03/22 0143 09/04/22 0155 09/05/22 0447  NA 141 144 142 139 140  K 3.1* 3.3* 3.4* 3.7 3.9  CL 114* 111 111 110 108  CO2 15*  21* 22 21* 23  GLUCOSE 76 84 79 85 86  BUN 103* 91* 76* 65* 53*  CREATININE 7.27* 6.11* 5.07* 4.32* 3.78*  CALCIUM 7.6* 7.9* 7.8* 7.9* 8.4*  MG  --   --   --  1.1* 1.8  PHOS 5.7* 3.7 3.1 2.5 2.0*   GFR: Estimated Creatinine Clearance: 19.5 mL/min (A) (by C-G formula based on SCr of 3.78 mg/dL (H)). Liver Function Tests: Recent Labs  Lab 09/02/22 0349 09/03/22 0143 09/03/22 1223 09/04/22 0155 09/05/22 0447  AST  --   --  17 21 20   ALT  --   --  16 17 17   ALKPHOS  --   --  120 101 101  BILITOT  --   --  0.6 0.5 0.6  PROT  --   --  6.2* 5.6* 5.7*  ALBUMIN 2.8* 2.8* 3.0* 2.8* 2.8*   No results for input(s): "LIPASE", "AMYLASE" in the last 168 hours. Recent Labs  Lab 08/30/22 1952 09/03/22 1223  AMMONIA 60* 33   Coagulation Profile: No results for input(s): "INR", "PROTIME" in the last 168 hours. Cardiac Enzymes: Recent Labs  Lab 08/30/22 1810  CKTOTAL 382   BNP (last 3 results) Recent Labs    07/14/22 0915  PROBNP 11,718*   HbA1C: No results for input(s): "HGBA1C" in the last 72 hours. CBG: No results for input(s): "GLUCAP" in the last 168  hours. Lipid Profile: No results for input(s): "CHOL", "HDL", "LDLCALC", "TRIG", "CHOLHDL", "LDLDIRECT" in the last 72 hours. Thyroid Function Tests: No results for input(s): "TSH", "T4TOTAL", "FREET4", "T3FREE", "THYROIDAB" in the last 72 hours. Anemia Panel: Recent Labs    09/04/22 0155  VITAMINB12 417  FOLATE 5.3*  FERRITIN 281  TIBC 202*  IRON 36*  RETICCTPCT 1.1   Sepsis Labs: No results for input(s): "PROCALCITON", "LATICACIDVEN" in the last 168 hours.  No results found for this or any previous visit (from the past 240 hour(s)).   Radiology Studies: DG Knee 1-2 Views Left  Result Date: 09/05/2022 CLINICAL DATA:  Pain EXAM: LEFT KNEE - 1-2 VIEW COMPARISON:  None Available. FINDINGS: No recent fracture or dislocation is seen. There is no effusion in suprapatellar bursa. Sclerotic densities are seen in distal shaft of femur around the proximal shaft of tibia, possibly benign process such as old bone infarcts. There is soft tissue swelling anterior to the proximal tibia at the attachment of infrapatellar tendon. Scattered arterial calcifications are seen in the soft tissues. IMPRESSION: No acute findings are seen in the bony structures of left knee. There is no significant effusion. There is soft tissue swelling anterior to the proximal tibia at the attachment of infrapatellar tendon suggesting bursitis or tendinosis. Arteriosclerosis. Electronically Signed   By: Ernie Avena M.D.   On: 09/05/2022 11:54   DG CHEST PORT 1 VIEW  Result Date: 09/04/2022 CLINICAL DATA:  Shortness of breath EXAM: PORTABLE CHEST 1 VIEW COMPARISON:  Previous studies including the examination of 08/30/2022 FINDINGS: Transverse diameter of heart is increased. There is marked elevation of left hemidiaphragm. Increased density is seen in right parahilar region. There is crowding of markings in left lower lung field. There is no significant pleural effusion or pneumothorax. IMPRESSION: There is increased  density in right parahilar region suggesting possible pneumonia. Less likely possibility would be asymmetric pulmonary edema. Increased markings are seen in the left lower lung field which may suggest crowding of bronchovascular structures due to elevation of left hemidiaphragm or atelectasis/pneumonia. Electronically Signed   By: Rhae Hammock  Rathinasamy M.D.   On: 09/04/2022 07:57    Scheduled Meds:  amLODipine  10 mg Oral Daily   apixaban  2.5 mg Oral BID   aspirin EC  81 mg Oral Daily   atorvastatin  80 mg Oral Daily   brimonidine  1 drop Both Eyes Daily   calcitRIOL  0.25 mcg Oral Q M,W,F   folic acid  1 mg Oral Daily   iron polysaccharides  150 mg Oral Daily   latanoprost  1 drop Both Eyes QHS   metoprolol succinate  25 mg Oral Daily   OLANZapine  15 mg Oral QHS   OLANZapine  5 mg Oral Daily   pantoprazole  80 mg Oral Q1200   phosphorus  250 mg Oral Once   predniSONE  60 mg Oral Q breakfast   risperiDONE  1 mg Oral QHS   sertraline  100 mg Oral Daily   sodium bicarbonate  1,300 mg Oral TID   Continuous Infusions:  magnesium sulfate bolus IVPB      LOS: 6 days   Marguerita Merles, DO [facility] Available via Epic secure chat 7am-7pm After these hours, please refer to coverage provider listed on [email or web address] 09/05/2022, 2:33 PM

## 2022-09-05 NOTE — TOC Progression Note (Addendum)
Transition of Care [facility]) - Initial/Assessment Note    Patient Details  Name: Glenn Hawkins MRN: [id number] Date of Birth: Jan 01, 1956  Transition of Care [facility]) CM/SW Contact:    Ralene Bathe, LCSW Phone Number: 09/05/2022, 10:49 AM  Clinical Narrative:                 LCSW met with the patient at bedside.  The patient reports that after speaking with family he is now agreeable to discharging to a SNF at discharge.  The patient reports a preference for [facility].  He is open to a facility in [place] if [facility] is unable to accept.  LCSW explained the insurance authorization process and will send referral to facilities for review.   13:35- PASRR number received.   [id number]  16:20-  LCSW went to provide update to patient at bedside, but patient was drowsy and falling asleep during encounter.   TOC following.    Expected Discharge Plan: Home w Home Health Services Barriers to Discharge: Continued Medical Work up   Patient Goals and CMS Choice Patient states their goals for this hospitalization and ongoing recovery are:: To return home CMS Medicare.gov Compare Post Acute Care list provided to:: Patient Choice offered to / list presented to : Patient      Expected Discharge Plan and Services   Discharge Planning Services: CM Consult Post Acute Care Choice: Home Health Living arrangements for the past 2 months: Single Family Home                 DME Arranged: N/A DME Agency: NA       HH Arranged: PT, OT   Date HH Agency Contacted: 09/02/22 Time HH Agency Contacted: [phone]    Prior Living Arrangements/Services Living arrangements for the past 2 months: Single Family Home Lives with:: Self Patient language and need for interpreter reviewed:: Yes Do you feel safe going back to the place where you live?: Yes      Need for Family Participation in Patient Care: Yes (Comment) Care giver support system in place?: Yes (comment) Current  home services: DME (Cane, walker, scale) Criminal Activity/Legal Involvement Pertinent to Current Situation/Hospitalization: No - Comment as needed  Activities of Daily Living Home Assistive Devices/Equipment: Walker (specify type) ADL Screening (condition at time of admission) Patient's cognitive ability adequate to safely complete daily activities?: Yes Is the patient deaf or have difficulty hearing?: No Does the patient have difficulty seeing, even when wearing glasses/contacts?: No Does the patient have difficulty concentrating, remembering, or making decisions?: No Patient able to express need for assistance with ADLs?: Yes Does the patient have difficulty dressing or bathing?: No Independently performs ADLs?: Yes (appropriate for developmental age) Does the patient have difficulty walking or climbing stairs?: No Weakness of Legs: None Weakness of Arms/Hands: None  Permission Sought/Granted Permission sought to share information with : Facility Medical sales representative, Family Supports Permission granted to share information with : Yes, Verbal Permission Granted              Emotional Assessment Appearance:: Appears stated age Attitude/Demeanor/Rapport: Engaged, Gracious Affect (typically observed): Accepting, Appropriate, Calm, Hopeful, Pleasant Orientation: : Oriented to Self, Oriented to Place, Oriented to  Time, Oriented to Situation Alcohol / Substance Use: Not Applicable Psych Involvement: No (comment)  Admission diagnosis:  AKI (acute kidney injury) (HCC) [N17.9] Patient Active Problem List   Diagnosis Date Noted   AKI (acute kidney injury) (HCC) 08/30/2022   Severe mitral regurgitation  07/16/2022   Acute on chronic systolic heart failure (HCC) 07/03/2022   DOE (dyspnea on exertion) 05/16/2022   Combined systolic and diastolic congestive heart failure (HCC) 05/06/2022   Acute respiratory failure with hypoxia (HCC) 05/06/2022   Elevated troponin 05/06/2022   Iron  deficiency anemia 05/06/2022   History of DVT (deep vein thrombosis) 05/06/2022   Chronic anticoagulation 05/06/2022   Thrombocytopenia (HCC) 05/06/2022   Paranoid schizophrenia (HCC) 05/06/2022   Unintentional weight loss 11/08/2020   Pancreatic lesion 11/08/2020   Increased ammonia level 11/08/2020   Abdominal pain 11/08/2020   Diarrhea 11/08/2020   Indigestion 11/08/2020   Abnormality of abdominal aorta 11/08/2020   Hyperlipidemia LDL goal <70    Hemorrhoids 04/18/2015   Colon polyps 04/18/2015   Rectal bleed 11/08/2014   CKD (chronic kidney disease) stage 3, GFR 30-59 ml/min (HCC) 11/08/2014   Hyperglycemia 11/08/2014   GERD without esophagitis 11/08/2014   Obesity 11/08/2014   Rectal bleeding 11/08/2014   Acute deep vein thrombosis (DVT) of femoral vein of right lower extremity (HCC) 10/18/2014   Chronic systolic heart failure (HCC) 12/09/2013   Cardiomyopathy, ischemic 06/07/2013   Mitral valve disorder    Coronary atherosclerosis of native coronary artery    Hypertension    Hyperlipidemia    PCP:  [name], MD Pharmacy:   [facility] [phone] - [place], [place] - [address] AT [facility] [address] [place] [place] [address] [place] [address] Phone: [phone] Fax: [phone]  [facility] - [place], [place] - [address] [place] [address] Phone: [phone] Fax: [phone]  [facility] - [place], [place] - [address] [place] [address] Phone: [phone] Fax: [phone]  [name] [address] [place] [address] Phone: [phone] Fax: [phone]     Social Determinants of Health (SDOH) Social History: SDOH Screenings   Food Insecurity: No Food Insecurity (08/30/2022)  Housing: Low Risk  (08/30/2022)  Transportation Needs: No Transportation Needs (08/30/2022)   Utilities: Not At Risk (08/30/2022)  Financial Resource Strain: Low Risk  (07/18/2022)  Physical Activity: Inactive (08/07/2022)  Social Connections: Unknown (10/11/2021)   Received from [facility]  Tobacco Use: Medium Risk (08/30/2022)   SDOH Interventions: Transportation Interventions: Intervention Not Indicated, Inpatient TOC, Patient Resources (Friends/Family)   Readmission Risk Interventions    09/02/2022    3:42 PM  Readmission Risk Prevention Plan  Transportation Screening Complete  PCP or Specialist Appt within 3-5 Days Complete  HRI or Home Care Consult Complete  Social Work Consult for Recovery Care Planning/Counseling Complete  Palliative Care Screening Not Applicable  Medication Review Oceanographer) Referral to Pharmacy

## 2022-09-05 NOTE — [place] FL2 (Signed)
MEDICAID FL2 LEVEL OF CARE FORM     IDENTIFICATION  Patient Name: Glenn Hawkins Birthdate: 10/09/1955 Sex: male Admission Date (Current Location): 08/30/2022  [facility] and [place] Number:  Producer, television/film/video and Address:  [facility], [address], [place], [place] [address]      Provider Number: [id number]  Attending Physician Name and Address:  Merlene Laughter, DO  Relative Name and Phone Number:  Jae Dire (Sister)  [phone]    Current Level of Care: Hospital Recommended Level of Care: Skilled Nursing Facility Prior Approval Number:    Date Approved/Denied:   PASRR Number: pending  Discharge Plan: SNF    Current Diagnoses: Patient Active Problem List   Diagnosis Date Noted   AKI (acute kidney injury) (HCC) 08/30/2022   Severe mitral regurgitation 07/16/2022   Acute on chronic systolic heart failure (HCC) 07/03/2022   DOE (dyspnea on exertion) 05/16/2022   Combined systolic and diastolic congestive heart failure (HCC) 05/06/2022   Acute respiratory failure with hypoxia (HCC) 05/06/2022   Elevated troponin 05/06/2022   Iron deficiency anemia 05/06/2022   History of DVT (deep vein thrombosis) 05/06/2022   Chronic anticoagulation 05/06/2022   Thrombocytopenia (HCC) 05/06/2022   Paranoid schizophrenia (HCC) 05/06/2022   Unintentional weight loss 11/08/2020   Pancreatic lesion 11/08/2020   Increased ammonia level 11/08/2020   Abdominal pain 11/08/2020   Diarrhea 11/08/2020   Indigestion 11/08/2020   Abnormality of abdominal aorta 11/08/2020   Hyperlipidemia LDL goal <70    Hemorrhoids 04/18/2015   Colon polyps 04/18/2015   Rectal bleed 11/08/2014   CKD (chronic kidney disease) stage 3, GFR 30-59 ml/min (HCC) 11/08/2014   Hyperglycemia 11/08/2014   GERD without esophagitis 11/08/2014   Obesity 11/08/2014   Rectal bleeding 11/08/2014   Acute deep vein thrombosis (DVT) of femoral vein of right lower extremity  (HCC) 10/18/2014   Chronic systolic heart failure (HCC) 12/09/2013   Cardiomyopathy, ischemic 06/07/2013   Mitral valve disorder    Coronary atherosclerosis of native coronary artery    Hypertension    Hyperlipidemia     Orientation RESPIRATION BLADDER Height & Weight     Self, Time, Situation, Place  Normal Continent Weight: 179 lb 7.3 oz (81.4 kg) Height:  5\' 9"  (175.3 cm)  BEHAVIORAL SYMPTOMS/MOOD NEUROLOGICAL BOWEL NUTRITION STATUS      Continent Diet (see discharge summary)  AMBULATORY STATUS COMMUNICATION OF NEEDS Skin   Extensive Assist Verbally Normal                       Personal Care Assistance Level of Assistance  Bathing, Feeding, Dressing Bathing Assistance: Limited assistance Feeding assistance: Independent Dressing Assistance: Limited assistance     Functional Limitations Info  Sight, Hearing, Speech Sight Info: Impaired Hearing Info: Adequate Speech Info: Adequate    SPECIAL CARE FACTORS FREQUENCY  OT (By licensed OT), PT (By licensed PT)     PT Frequency: 5x/ week OT Frequency: 5x/ week            Contractures Contractures Info: Not present    Additional Factors Info  Code Status, Psychotropic, Allergies Code Status Info: FULL Allergies Info: Nifedipine  Aciphex (Rabeprazole Sodium)  Benazepril  Haloperidol  Prilosec (Omeprazole) Psychotropic Info: see d/c medication list         Current Medications (09/05/2022):  This is the current hospital active medication list Current Facility-Administered Medications  Medication Dose Route Frequency Provider Last Rate Last Admin  acetaminophen (TYLENOL) tablet 650 mg  650 mg Oral Q4H PRN Kathlen Mody, MD   650 mg at 09/05/22 0928   amLODipine (NORVASC) tablet 10 mg  10 mg Oral Daily Rodolph Bong, MD   10 mg at 09/05/22 1610   apixaban (ELIQUIS) tablet 2.5 mg  2.5 mg Oral BID Kathlen Mody, MD   2.5 mg at 09/05/22 9604   aspirin EC tablet 81 mg  81 mg Oral Daily Kathlen Mody, MD   81 mg  at 09/05/22 0928   atorvastatin (LIPITOR) tablet 80 mg  80 mg Oral Daily Kathlen Mody, MD   80 mg at 09/05/22 0928   brimonidine (ALPHAGAN) 0.2 % ophthalmic solution 1 drop  1 drop Both Eyes Daily Rodolph Bong, MD   1 drop at 09/05/22 5409   calcitRIOL (ROCALTROL) capsule 0.25 mcg  0.25 mcg Oral Q M,W,F Rodolph Bong, MD   0.25 mcg at 09/05/22 8119   folic acid (FOLVITE) tablet 1 mg  1 mg Oral Daily Marguerita Merles [place], DO   1 mg at 09/05/22 1478   iron polysaccharides (NIFEREX) capsule 150 mg  150 mg Oral Daily Marguerita Merles Latif, DO   150 mg at 09/05/22 1056   latanoprost (XALATAN) 0.005 % ophthalmic solution 1 drop  1 drop Both Eyes QHS Rodolph Bong, MD   1 drop at 09/04/22 2254   metoprolol succinate (TOPROL-XL) 24 hr tablet 25 mg  25 mg Oral Daily Rodolph Bong, MD   25 mg at 09/05/22 0928   OLANZapine (ZYPREXA) tablet 15 mg  15 mg Oral QHS Kathlen Mody, MD   15 mg at 09/04/22 2252   OLANZapine (ZYPREXA) tablet 5 mg  5 mg Oral Daily Kathlen Mody, MD   5 mg at 09/05/22 0928   ondansetron (ZOFRAN) injection 4 mg  4 mg Intravenous Q6H PRN Kathlen Mody, MD       pantoprazole (PROTONIX) EC tablet 80 mg  80 mg Oral Q1200 Rodolph Bong, MD   80 mg at 09/05/22 1100   predniSONE (DELTASONE) tablet 60 mg  60 mg Oral Q breakfast Marguerita Merles [place], DO   60 mg at 09/05/22 1056   risperiDONE (RISPERDAL) tablet 1 mg  1 mg Oral QHS Kathlen Mody, MD   1 mg at 09/04/22 2252   sertraline (ZOLOFT) tablet 100 mg  100 mg Oral Daily Kathlen Mody, MD   100 mg at 09/05/22 2956   sodium bicarbonate tablet 1,300 mg  1,300 mg Oral TID Tyler Pita, MD   1,300 mg at 09/05/22 2130   zolpidem (AMBIEN) tablet 5 mg  5 mg Oral QHS PRN Hillary Bow, DO   5 mg at 09/04/22 2258     Discharge Medications: Please see discharge summary for a list of discharge medications.  Relevant Imaging Results:  Relevant Lab Results:   Additional Information SSN: [id number]  Jaiyla Granados F  Wendel Homeyer, LCSW

## 2022-09-06 DIAGNOSIS — N179 Acute kidney failure, unspecified: Secondary | ICD-10-CM | POA: Diagnosis not present

## 2022-09-06 DIAGNOSIS — I10 Essential (primary) hypertension: Secondary | ICD-10-CM | POA: Diagnosis not present

## 2022-09-06 DIAGNOSIS — I82411 Acute embolism and thrombosis of right femoral vein: Secondary | ICD-10-CM | POA: Diagnosis not present

## 2022-09-06 DIAGNOSIS — F200 Paranoid schizophrenia: Secondary | ICD-10-CM | POA: Diagnosis not present

## 2022-09-06 LAB — CBC WITH DIFFERENTIAL/PLATELET
Abs Immature Granulocytes: 0.05 10*3/uL (ref 0.00–0.07)
Basophils Absolute: 0 10*3/uL (ref 0.0–0.1)
Basophils Relative: 0 %
Eosinophils Absolute: 0 10*3/uL (ref 0.0–0.5)
Eosinophils Relative: 0 %
HCT: 32.2 % — ABNORMAL LOW (ref 39.0–52.0)
Hemoglobin: 10.5 g/dL — ABNORMAL LOW (ref 13.0–17.0)
Immature Granulocytes: 1 %
Lymphocytes Relative: 7 %
Lymphs Abs: 0.6 10*3/uL — ABNORMAL LOW (ref 0.7–4.0)
MCH: 29.2 pg (ref 26.0–34.0)
MCHC: 32.6 g/dL (ref 30.0–36.0)
MCV: 89.7 fL (ref 80.0–100.0)
Monocytes Absolute: 0.6 10*3/uL (ref 0.1–1.0)
Monocytes Relative: 7 %
Neutro Abs: 8 10*3/uL — ABNORMAL HIGH (ref 1.7–7.7)
Neutrophils Relative %: 85 %
Platelets: 69 10*3/uL — ABNORMAL LOW (ref 150–400)
RBC: 3.59 MIL/uL — ABNORMAL LOW (ref 4.22–5.81)
RDW: 15.2 % (ref 11.5–15.5)
WBC: 9.3 10*3/uL (ref 4.0–10.5)
nRBC: 0 % (ref 0.0–0.2)

## 2022-09-06 LAB — COMPREHENSIVE METABOLIC PANEL
ALT: 16 U/L (ref 0–44)
AST: 18 U/L (ref 15–41)
Albumin: 2.7 g/dL — ABNORMAL LOW (ref 3.5–5.0)
Alkaline Phosphatase: 96 U/L (ref 38–126)
Anion gap: 8 (ref 5–15)
BUN: 52 mg/dL — ABNORMAL HIGH (ref 8–23)
CO2: 22 mmol/L (ref 22–32)
Calcium: 8.5 mg/dL — ABNORMAL LOW (ref 8.9–10.3)
Chloride: 109 mmol/L (ref 98–111)
Creatinine, Ser: 3.71 mg/dL — ABNORMAL HIGH (ref 0.61–1.24)
GFR, Estimated: 17 mL/min — ABNORMAL LOW (ref >60)
Glucose, Bld: 170 mg/dL — ABNORMAL HIGH (ref 70–99)
Potassium: 3.9 mmol/L (ref 3.5–5.1)
Sodium: 139 mmol/L (ref 135–145)
Total Bilirubin: 0.5 mg/dL (ref 0.3–1.2)
Total Protein: 5.7 g/dL — ABNORMAL LOW (ref 6.5–8.1)

## 2022-09-06 LAB — PHOSPHORUS: Phosphorus: 1.7 mg/dL — ABNORMAL LOW (ref 2.5–4.6)

## 2022-09-06 LAB — MAGNESIUM: Magnesium: 2.1 mg/dL (ref 1.7–2.4)

## 2022-09-06 MED ORDER — K PHOS MONO-SOD PHOS DI & MONO 155-852-130 MG PO TABS
500.0000 mg | ORAL_TABLET | Freq: Once | ORAL | Status: AC
  Administered 2022-09-06: 500 mg via ORAL
  Filled 2022-09-06: qty 2

## 2022-09-06 NOTE — Progress Notes (Signed)
PROGRESS NOTE    Glenn Hawkins  ZOX:[id number] DOB: 30-Mar-1955 DOA: 08/30/2022 PCP: [name], MD   Brief Narrative:  The patient is a 67 year old chronically ill-appearing African-American male with a past medical history significant for but limited to hypertension, diabetes mellitus type 2, hyperlipidemia, CAD, history of chronic systolic CHF and ischemic cardiomyopathy, history of severe mitral regurgitation, schizophrenia, iron deficiency anemia, CKD stage IV as well as other comorbidities who presented to [facility] facility with a 2-week history of fatigue, anorexia and intermittent diarrhea.  He was seen in the ED and saturations were noted to be 99%.  Chest x-ray was done within normal limits.  Lab work was showing a bicarbonate level of 13, BUN of 114 and creatinine of 9.77.  Mag level is low and he is scheduled to [name] for further evaluation management of AKI on CKD stage IV.    Nephrology was consulted and patient was hydrated.  Renal function continues to improve daily.  Intemittently appears somonlent and drowsy.  PT OT was recommending home health but Sister says he does not have 24/7 care that PT is recommending. PT/OT now recommending SNF. Patient does not want to go to SNF however he has changed his mind and agreeable to Rehab. Awaiting placement and appears medically stable for Discharge.   Assessment and Plan:  AKI on CKD stage IV versus chronic progressive kidney disease, improving and stabilizing  -Likely secondary to prerenal azotemia in the setting of recent diarrhea, concomitant SGLT2 use, spironolactone, furosemide.   -Patient noted to have  started recently on Jardiance, approximately a month prior to admission.   -Patient admitted with acute AKI/ARF on chronic CKD stage IV with a creatinine noted on admission of 9.35. -Last creatinine noted on 07/16/2022 of 3.40. -BUN/Cr Trend: Recent Labs  Lab 08/31/22 0649 09/01/22 0845 09/02/22 0349 09/03/22 0143  09/04/22 0155 09/05/22 0447 09/06/22 0359  BUN 112* 103* 91* 76* 65* 53* 52*  CREATININE 8.95* 7.27* 6.11* 5.07* 4.32* 3.78* 3.71*  -Urinalysis done with large hemoglobin, 100 of protein, 11-20 WBCs. -Renal ultrasound done negative for hydronephrosis. - Intake/Output Summary (Last 24 hours) at 09/06/2022 1259 Last data filed at 09/06/2022 0323 Gross per 24 hour  Intake 1120 ml  Output --  Net 1120 ml   -Renal function improving daily but Nephrology probably suspects that we will continue to see improvement but as a baseline is fairly advanced already -Bicarb drip discontinued per nephrology yesterday. -Monitor urine output follow renal function. -Avoid Nephrotoxic Medications, Contrast Dyes, Hypotension and Dehydration to Ensure Adequate Renal Perfusion and will need to Renally Adjust Meds -Continue to Monitor and Trend Renal Function carefully and repeat CMP in the AM  -Nephrology following and appreciate input and recommendations and they are recommending to follow-up in the outpatient setting in 1 to 2 weeks and continue to hold spironolactone, Jardiance and Lasix and resume Lasix if he has edema. -PT/OT recommending home health but he needs 24/7 per PT/OT and sister says he does not have that so PT/OT re-evaluated and Recommending SNF and patient is now agreeable and awaiting placement    Metabolic Acidosis, improved  -Likely secondary to problem #1. -Patient with no signs or symptoms of infection. -Acidosis improved on bicarb drip but slightly dropped.  Now CO2 is 22, anion gap is 8, chloride level is 109  -Continue with sodium bicarb at 1300 mg p.o. 3 times daily -Bicarb drip discontinued per nephrology yesterday.   -Nephrology following appreciate further evaluation and assistance.  Diarrhea -Resolved.  Lethargy and Somnolence, improved -Received Ambien last night -Arousable and answers questions appropriately but falls asleep easily -Ammonia Level was elevated a few days  ago but improved and went from 60 -> 33 -If persist may consider obtaining ABG and TSH, RPR, B12   Ischemic Cardiomyopathy/CAD -EF 40 to 45% with severe MR. -Patient noted to be dehydrated on admission and placed on IV fluids with gentle hydration.   -IV fluids discontinued -Continue to hold diuretics of spironolactone and Lasix.   -Continue Aspirin 81 mg po daily and Atorvastatin 80 mg po Daily -C/w Toprol-XL at a reduced dose of 25 mg daily.   -CXR done and showed "There is increased density in right parahilar region suggesting possible pneumonia. Less likely possibility would be asymmetric pulmonary edema. Increased markings are seen in the left lower lung field which may suggest crowding of bronchovascular structures due to elevation of left hemidiaphragm or atelectasis/pneumonia." -Will provide Flutter Valve and Incentive Spirometry   History of Paranoid Schizophrenia -Stable on home regimen of Olanzapine 5 mg po Daily and 15 mg po qHS, Risperidone 1 mg po qHS, and Sertraline 100 mg po Daily    History of Right Lower Extremity DVT -Continue Anticoagulation with Apixaban .    Hypertension -Continue with Amlodipine 10 mg po Daily and Metoprolol Succinate 25 mg po daily -Continue to Monitor BP per Protocol -Last BP Reading was 140/81   Hyperlipidemia -Continue Atorvastatin 80 mg po Daily  Hypophosphatemia -Phos Level Trend: Recent Labs  Lab 08/31/22 0115 09/01/22 0845 09/02/22 0349 09/03/22 0143 09/04/22 0155 09/05/22 0447 09/06/22 0359  PHOS 7.1* 5.7* 3.7 3.1 2.5 2.0* 1.7*  -Replete with po K Phos 500 mg x 1 -Continue to Monitor and Replete as Necessary  Left Knee Pain, improving  -Start Prednisone Taper and improving  -Concern for Gout but Right anterior knee is tender to palpate and swollen -Obtained DG X-Ray of the the Knee and showed "No acute findings are seen in the bony structures of left knee.There is no significant effusion. There is soft tissue swelling  anterior to the proximal tibia at the attachment of infrapatellar tendon suggesting bursitis or tendinosis. Arteriosclerosis." -Check Uric Acid Level in the AM -Order Ice  -Will consider Ortho Evaluation if not improving but can bee seen in the outpatient setting    Hypomagnesemia -Patient's Mag Level Trend: Recent Labs  Lab 09/04/22 0155 09/05/22 0447 09/06/22 0359  MG 1.1* 1.8 2.1  -Replete with IV Mag Sulfate 4 grams yesterday and will replete with IV Mag Sulfate 2 grams today  -Continue to Monitor and Replete as Necessary -Repeat Mag in the AM    Normocytic Anemia/Anemia of Chronic Kidney Disease Thrombocytopenia -Hgb/Hct and Platelet Count Trend: Recent Labs  Lab 08/31/22 0649 09/01/22 0845 09/02/22 0349 09/03/22 0143 09/04/22 0155 09/05/22 0447 09/06/22 0359  HGB 11.3* 10.7* 10.9* 10.6* 10.6* 10.4* 10.5*  HCT 34.6* 32.5* 32.7* 32.2* 32.9* 32.0* 32.2*  MCV 88.9 89.0 84.9 86.8 87.7 86.5 89.7  PLT 91* 88* 88* 82* 72* 70* 69*  -Checked Anemia Panel showed an iron level of 36, UIBC of 166, TIBC of 202, saturation ratios of 18%, ferritin low 281, folate of 5.3, and vitamin B12 417 -Will start folic acid supplement and iron supplements -Continue to Monitor for S/Sx of Bleeding; No overt bleeding noted -Repeat CBC in the AM  Hypokalemia -Patient's K+ Level Trend: Recent Labs  Lab 08/31/22 3244 09/01/22 0845 09/02/22 0102 09/03/22 0143 09/04/22 0155 09/05/22 0447 09/06/22 0359  K 3.8 3.1* 3.3* 3.4* 3.7 3.9 3.9  -Continue to Monitor and Replete as Necessary -Repeat CMP in the AM   Hypoalbuminemia -Patient's Albumin Trend: Recent Labs  Lab 09/01/22 0845 09/02/22 0349 09/03/22 0143 09/03/22 1223 09/04/22 0155 09/05/22 0447 09/06/22 0359  ALBUMIN 2.8* 2.8* 2.8* 3.0* 2.8* 2.8* 2.7*  -Continue to Monitor and Trend and repeat CMP in the AM  DVT prophylaxis: apixaban (ELIQUIS) tablet 2.5 mg Start: 08/30/22 2200 apixaban (ELIQUIS) tablet 2.5 mg    Code  Status: Full Code Family Communication: No family present at bedside   Disposition Plan:  Level of care: Telemetry Medical Status is: Inpatient Remains inpatient appropriate because: Awaiting SNF placement   Consultants:  Nephrology  Procedures:  As delineated as above  Antimicrobials:  Anti-infectives (From admission, onward)    None       Subjective: Seen and examined at bedside and continues to be a little bit drowsy but in no acute distress.  Awakes and states that he did not sleep very well last night and stays up way too late.  No nausea or vomiting.  Thinks his knee is doing much better today.  No other concerns or complaints at this time.  Objective: Vitals:   09/06/22 0501 09/06/22 0700 09/06/22 0753 09/06/22 0905  BP: (!) 149/86  (!) 140/81   Pulse: 81  69   Resp: 18  19   Temp: 98.6 F (37 C)  98.3 F (36.8 C)   TempSrc: Oral  Oral   SpO2: 100% 99% 99%   Weight:    82.4 kg  Height:        Intake/Output Summary (Last 24 hours) at 09/06/2022 1301 Last data filed at 09/06/2022 4696 Gross per 24 hour  Intake 880 ml  Output --  Net 880 ml   Filed Weights   09/02/22 0429 09/04/22 0500 09/06/22 0905  Weight: 80.2 kg 81.4 kg 82.4 kg   Examination: Physical Exam:  Constitutional: WN/WD overweight African-American male in no acute distress who appears little drowsy but is easily arousable Respiratory: Diminished to auscultation bilaterally, no wheezing, rales, rhonchi or crackles. Normal respiratory effort and patient is not tachypenic. No accessory muscle use.  Unlabored breathing Cardiovascular: RRR, no murmurs / rubs / gallops. S1 and S2 auscultated.  Minimal extremity edema Abdomen: Soft, non-tender, distended secondary to body habitus. Bowel sounds positive.  GU: Deferred. Musculoskeletal: No clubbing / cyanosis of digits/nails. No joint deformity upper and lower extremities.   Skin: No rashes, lesions, ulcers on limited skin evaluation. No induration;  Warm and dry.  Neurologic: A little somnolent and drowsy but easily arousable and CN 2-12 grossly intact with no focal deficits. Romberg sign and cerebellar reflexes not assessed.  Psychiatric: Appears calm and has a pleasant mood and affect.  Data Reviewed: I have personally reviewed following labs and imaging studies  CBC: Recent Labs  Lab 08/31/22 0649 09/01/22 0845 09/02/22 0349 09/03/22 0143 09/04/22 0155 09/05/22 0447 09/06/22 0359  WBC 4.6   < > 4.6 5.2 5.5 6.7 9.3  NEUTROABS 2.7  --  2.6  --  3.1 4.3 8.0*  HGB 11.3*   < > 10.9* 10.6* 10.6* 10.4* 10.5*  HCT 34.6*   < > 32.7* 32.2* 32.9* 32.0* 32.2*  MCV 88.9   < > 84.9 86.8 87.7 86.5 89.7  PLT 91*   < > 88* 82* 72* 70* 69*   < > = values in this interval not displayed.   Basic Metabolic Panel: Recent Labs  Lab 09/02/22 0349 09/03/22 0143 09/04/22 0155 09/05/22 0447 09/06/22 0359  NA 144 142 139 140 139  K 3.3* 3.4* 3.7 3.9 3.9  CL 111 111 110 108 109  CO2 21* 22 21* 23 22  GLUCOSE 84 79 85 86 170*  BUN 91* 76* 65* 53* 52*  CREATININE 6.11* 5.07* 4.32* 3.78* 3.71*  CALCIUM 7.9* 7.8* 7.9* 8.4* 8.5*  MG  --   --  1.1* 1.8 2.1  PHOS 3.7 3.1 2.5 2.0* 1.7*   GFR: Estimated Creatinine Clearance: 19.9 mL/min (A) (by C-G formula based on SCr of 3.71 mg/dL (H)). Liver Function Tests: Recent Labs  Lab 09/03/22 0143 09/03/22 1223 09/04/22 0155 09/05/22 0447 09/06/22 0359  AST  --  17 21 20 18   ALT  --  16 17 17 16   ALKPHOS  --  120 101 101 96  BILITOT  --  0.6 0.5 0.6 0.5  PROT  --  6.2* 5.6* 5.7* 5.7*  ALBUMIN 2.8* 3.0* 2.8* 2.8* 2.7*   No results for input(s): "LIPASE", "AMYLASE" in the last 168 hours. Recent Labs  Lab 08/30/22 1952 09/03/22 1223  AMMONIA 60* 33   Coagulation Profile: No results for input(s): "INR", "PROTIME" in the last 168 hours. Cardiac Enzymes: Recent Labs  Lab 08/30/22 1810  CKTOTAL 382   BNP (last 3 results) Recent Labs    07/14/22 0915  PROBNP 11,718*   HbA1C: No  results for input(s): "HGBA1C" in the last 72 hours. CBG: No results for input(s): "GLUCAP" in the last 168 hours. Lipid Profile: No results for input(s): "CHOL", "HDL", "LDLCALC", "TRIG", "CHOLHDL", "LDLDIRECT" in the last 72 hours. Thyroid Function Tests: No results for input(s): "TSH", "T4TOTAL", "FREET4", "T3FREE", "THYROIDAB" in the last 72 hours. Anemia Panel: Recent Labs    09/04/22 0155  VITAMINB12 417  FOLATE 5.3*  FERRITIN 281  TIBC 202*  IRON 36*  RETICCTPCT 1.1   Sepsis Labs: No results for input(s): "PROCALCITON", "LATICACIDVEN" in the last 168 hours.  No results found for this or any previous visit (from the past 240 hour(s)).   Radiology Studies: DG Knee 1-2 Views Left  Result Date: 09/05/2022 CLINICAL DATA:  Pain EXAM: LEFT KNEE - 1-2 VIEW COMPARISON:  None Available. FINDINGS: No recent fracture or dislocation is seen. There is no effusion in suprapatellar bursa. Sclerotic densities are seen in distal shaft of femur around the proximal shaft of tibia, possibly benign process such as old bone infarcts. There is soft tissue swelling anterior to the proximal tibia at the attachment of infrapatellar tendon. Scattered arterial calcifications are seen in the soft tissues. IMPRESSION: No acute findings are seen in the bony structures of left knee. There is no significant effusion. There is soft tissue swelling anterior to the proximal tibia at the attachment of infrapatellar tendon suggesting bursitis or tendinosis. Arteriosclerosis. Electronically Signed   By: Ernie Avena M.D.   On: 09/05/2022 11:54    Scheduled Meds:  amLODipine  10 mg Oral Daily   apixaban  2.5 mg Oral BID   aspirin EC  81 mg Oral Daily   atorvastatin  80 mg Oral Daily   brimonidine  1 drop Both Eyes Daily   calcitRIOL  0.25 mcg Oral Q M,W,F   folic acid  1 mg Oral Daily   iron polysaccharides  150 mg Oral Daily   latanoprost  1 drop Both Eyes QHS   metoprolol succinate  25 mg Oral Daily    OLANZapine  15 mg Oral QHS  OLANZapine  5 mg Oral Daily   pantoprazole  80 mg Oral Q1200   predniSONE  60 mg Oral Q breakfast   risperiDONE  1 mg Oral QHS   sertraline  100 mg Oral Daily   sodium bicarbonate  1,300 mg Oral TID   Continuous Infusions:   LOS: 7 days   Marguerita Merles, DO [facility] Available via Epic secure chat 7am-7pm After these hours, please refer to coverage provider listed on [email or web address] 09/06/2022, 1:01 PM

## 2022-09-06 NOTE — Plan of Care (Signed)
°  Problem: Clinical Measurements: °Goal: Respiratory complications will improve °Outcome: Progressing °Goal: Cardiovascular complication will be avoided °Outcome: Progressing °  °Problem: Activity: °Goal: Risk for activity intolerance will decrease °Outcome: Progressing °  °

## 2022-09-07 DIAGNOSIS — I10 Essential (primary) hypertension: Secondary | ICD-10-CM | POA: Diagnosis not present

## 2022-09-07 DIAGNOSIS — I82411 Acute embolism and thrombosis of right femoral vein: Secondary | ICD-10-CM | POA: Diagnosis not present

## 2022-09-07 DIAGNOSIS — N179 Acute kidney failure, unspecified: Secondary | ICD-10-CM | POA: Diagnosis not present

## 2022-09-07 DIAGNOSIS — F200 Paranoid schizophrenia: Secondary | ICD-10-CM | POA: Diagnosis not present

## 2022-09-07 LAB — CBC WITH DIFFERENTIAL/PLATELET
Abs Immature Granulocytes: 0.06 10*3/uL (ref 0.00–0.07)
Basophils Absolute: 0 10*3/uL (ref 0.0–0.1)
Basophils Relative: 0 %
Eosinophils Absolute: 0 10*3/uL (ref 0.0–0.5)
Eosinophils Relative: 0 %
HCT: 31.6 % — ABNORMAL LOW (ref 39.0–52.0)
Hemoglobin: 10.1 g/dL — ABNORMAL LOW (ref 13.0–17.0)
Immature Granulocytes: 1 %
Lymphocytes Relative: 7 %
Lymphs Abs: 0.7 10*3/uL (ref 0.7–4.0)
MCH: 28.3 pg (ref 26.0–34.0)
MCHC: 32 g/dL (ref 30.0–36.0)
MCV: 88.5 fL (ref 80.0–100.0)
Monocytes Absolute: 0.5 10*3/uL (ref 0.1–1.0)
Monocytes Relative: 5 %
Neutro Abs: 8.9 10*3/uL — ABNORMAL HIGH (ref 1.7–7.7)
Neutrophils Relative %: 87 %
Platelets: 74 10*3/uL — ABNORMAL LOW (ref 150–400)
RBC: 3.57 MIL/uL — ABNORMAL LOW (ref 4.22–5.81)
RDW: 15.1 % (ref 11.5–15.5)
WBC: 10.2 10*3/uL (ref 4.0–10.5)
nRBC: 0 % (ref 0.0–0.2)

## 2022-09-07 LAB — COMPREHENSIVE METABOLIC PANEL
ALT: 16 U/L (ref 0–44)
AST: 20 U/L (ref 15–41)
Albumin: 2.7 g/dL — ABNORMAL LOW (ref 3.5–5.0)
Alkaline Phosphatase: 86 U/L (ref 38–126)
Anion gap: 8 (ref 5–15)
BUN: 52 mg/dL — ABNORMAL HIGH (ref 8–23)
CO2: 22 mmol/L (ref 22–32)
Calcium: 8.6 mg/dL — ABNORMAL LOW (ref 8.9–10.3)
Chloride: 108 mmol/L (ref 98–111)
Creatinine, Ser: 3.53 mg/dL — ABNORMAL HIGH (ref 0.61–1.24)
GFR, Estimated: 18 mL/min — ABNORMAL LOW (ref >60)
Glucose, Bld: 111 mg/dL — ABNORMAL HIGH (ref 70–99)
Potassium: 3.9 mmol/L (ref 3.5–5.1)
Sodium: 138 mmol/L (ref 135–145)
Total Bilirubin: 0.5 mg/dL (ref 0.3–1.2)
Total Protein: 5.6 g/dL — ABNORMAL LOW (ref 6.5–8.1)

## 2022-09-07 LAB — MAGNESIUM: Magnesium: 1.8 mg/dL (ref 1.7–2.4)

## 2022-09-07 LAB — PHOSPHORUS: Phosphorus: 2.8 mg/dL (ref 2.5–4.6)

## 2022-09-07 MED ORDER — MAGNESIUM SULFATE 2 GM/50ML IV SOLN
2.0000 g | Freq: Once | INTRAVENOUS | Status: AC
  Administered 2022-09-07: 2 g via INTRAVENOUS
  Filled 2022-09-07: qty 50

## 2022-09-07 MED ORDER — PREDNISONE 50 MG PO TABS
50.0000 mg | ORAL_TABLET | Freq: Every day | ORAL | Status: DC
  Administered 2022-09-08: 50 mg via ORAL
  Filled 2022-09-07: qty 1

## 2022-09-07 NOTE — Plan of Care (Signed)
  Problem: Clinical Measurements: Goal: Respiratory complications will improve Outcome: Progressing Goal: Cardiovascular complication will be avoided Outcome: Progressing   Problem: Activity: Goal: Risk for activity intolerance will decrease Outcome: Progressing   Problem: Nutrition: Goal: Adequate nutrition will be maintained Outcome: Progressing   Problem: Coping: Goal: Level of anxiety will decrease Outcome: Progressing   Problem: Elimination: Goal: Will not experience complications related to urinary retention Outcome: Progressing   Problem: Pain Managment: Goal: General experience of comfort will improve Outcome: Progressing   

## 2022-09-07 NOTE — Plan of Care (Signed)

## 2022-09-07 NOTE — Progress Notes (Addendum)
PROGRESS NOTE    Glenn Hawkins  ZOX:[id number] DOB: [birth year] DOA: 08/30/2022 PCP: [name], MD   Brief Narrative:  The patient is a 67 year old chronically ill-appearing African-American male with a past medical history significant for but limited to hypertension, diabetes mellitus type 2, hyperlipidemia, CAD, history of chronic systolic CHF and ischemic cardiomyopathy, history of severe mitral regurgitation, schizophrenia, iron deficiency anemia, CKD stage IV as well as other comorbidities who presented to [facility] facility with a 2-week history of fatigue, anorexia and intermittent diarrhea.  He was seen in the ED and saturations were noted to be 99%.  Chest x-ray was done within normal limits.  Lab work was showing a bicarbonate level of 13, BUN of 114 and creatinine of 9.77.  Mag level is low and he is scheduled to [name] for further evaluation management of AKI on CKD stage IV.    Nephrology was consulted and patient was hydrated.  Renal function continues to improve daily.  Intemittently appears somonlent and drowsy.  PT OT was recommending home health but Sister says he does not have 24/7 care that PT is recommending. PT/OT now recommending SNF. Patient does not want to go to SNF however he has changed his mind and agreeable to Rehab. Awaiting placement and appears medically stable for Discharge.   Assessment and Plan:  AKI on CKD stage IV versus chronic progressive kidney disease, improving and stabilizing  -Likely secondary to prerenal azotemia in the setting of recent diarrhea, concomitant SGLT2 use, spironolactone, furosemide.   -Patient noted to have  started recently on Jardiance, approximately a month prior to admission.   -Patient admitted with acute AKI/ARF on chronic CKD stage IV with a creatinine noted on admission of 9.35. -Last creatinine noted on 07/16/2022 of 3.40. -BUN/Cr Trend: Recent Labs  Lab 09/01/22 0845 09/02/22 0349 09/03/22 0143 09/04/22 0155  09/05/22 0447 09/06/22 0359 09/07/22 0244  BUN 103* 91* 76* 65* 53* 52* 52*  CREATININE 7.27* 6.11* 5.07* 4.32* 3.78* 3.71* 3.53*  -Urinalysis done with large hemoglobin, 100 of protein, 11-20 WBCs. -Renal ultrasound done negative for hydronephrosis. - Intake/Output Summary (Last 24 hours) at 09/07/2022 1341 Last data filed at 09/07/2022 0900 Gross per 24 hour  Intake 700 ml  Output 0 ml  Net 700 ml   -Renal function improving daily but Nephrology probably suspects that we will continue to see improvement but as a baseline is fairly advanced already -Bicarb drip discontinued per nephrology yesterday. -Monitor urine output follow renal function. -Avoid Nephrotoxic Medications, Contrast Dyes, Hypotension and Dehydration to Ensure Adequate Renal Perfusion and will need to Renally Adjust Meds -Continue to Monitor and Trend Renal Function carefully and repeat CMP in the AM  -Nephrology following and appreciate input and recommendations and they are recommending to follow-up in the outpatient setting in 1 to 2 weeks and continue to hold spironolactone, Jardiance and Lasix and resume Lasix if he has edema. -PT/OT initially recommending home health but he needs 24/7 per PT/OT and sister says he does not have that so PT/OT re-evaluated and Recommending SNF and patient is now agreeable and awaiting placement    Metabolic Acidosis, improved  -Likely secondary to problem #1. -Patient with no signs or symptoms of infection. -Acidosis improved on bicarb drip but slightly dropped.  Now CO2 is 22, anion gap is 8, chloride level is 109  -Continue with sodium bicarb at 1300 mg p.o. 3 times daily -Bicarb drip discontinued per nephrology yesterday.   -Nephrology following appreciate further evaluation  and assistance.     Diarrhea -Resolved.  Lethargy and Somnolence, improved -Received Ambien last night -Arousable and answers questions appropriately but falls asleep easily -Ammonia Level was elevated a  few days ago but improved and went from 60 -> 33 -If persist may consider obtaining ABG and TSH, RPR, B12   Ischemic Cardiomyopathy/CAD -EF 40 to 45% with severe MR. -Patient noted to be dehydrated on admission and placed on IV fluids with gentle hydration.   -IV fluids discontinued -Continue to hold diuretics of spironolactone and Lasix.   -Continue Aspirin 81 mg po daily and Atorvastatin 80 mg po Daily -C/w Toprol-XL at a reduced dose of 25 mg daily.   -CXR done and showed "There is increased density in right parahilar region suggesting possible pneumonia. Less likely possibility would be asymmetric pulmonary edema. Increased markings are seen in the left lower lung field which may suggest crowding of bronchovascular structures due to elevation of left hemidiaphragm or atelectasis/pneumonia." -Will provide Flutter Valve and Incentive Spirometry   History of Paranoid Schizophrenia -Stable on home regimen of Olanzapine 5 mg po Daily and 15 mg po qHS, Risperidone 1 mg po qHS, and Sertraline 100 mg po Daily    History of Right Lower Extremity DVT -Continue Anticoagulation with Apixaban .    Hypertension -Continue with Amlodipine 10 mg po Daily and Metoprolol Succinate 25 mg po daily -Continue to Monitor BP per Protocol -Last BP Reading was 149/84   Hyperlipidemia -Continue Atorvastatin 80 mg po Daily  Hypophosphatemia -Phos Level Trend: Recent Labs  Lab 09/01/22 0845 09/02/22 0349 09/03/22 0143 09/04/22 0155 09/05/22 0447 09/06/22 0359 09/07/22 0244  PHOS 5.7* 3.7 3.1 2.5 2.0* 1.7* 2.8  -Continue to Monitor and Replete as Necessary -Repeat Phos Level in the AM  Left Knee Pain, improving  -Start Prednisone Taper and improving;p will go to 50 mg po daily -Concern for Gout but Right anterior knee is tender to palpate and swollen -Obtained DG X-Ray of the the Knee and showed "No acute findings are seen in the bony structures of left knee.There is no significant effusion. There  is soft tissue swelling anterior to the proximal tibia at the attachment of infrapatellar tendon suggesting bursitis or tendinosis. Arteriosclerosis." -Check Uric Acid Level in the AM -Order Ice  -Will consider Ortho Evaluation if not improving but can bee seen in the outpatient setting    Hypomagnesemia -Patient's Mag Level Trend: Recent Labs  Lab 09/04/22 0155 09/05/22 0447 09/06/22 0359 09/07/22 0244  MG 1.1* 1.8 2.1 1.8  -Replete with IV Mag Sulfate 2 grams again  -Continue to Monitor and Replete as Necessary -Repeat Mag in the AM    Normocytic Anemia/Anemia of Chronic Kidney Disease Thrombocytopenia -Hgb/Hct and Platelet Count Trend: Recent Labs  Lab 09/01/22 0845 09/02/22 0349 09/03/22 0143 09/04/22 0155 09/05/22 0447 09/06/22 0359 09/07/22 0244  HGB 10.7* 10.9* 10.6* 10.6* 10.4* 10.5* 10.1*  HCT 32.5* 32.7* 32.2* 32.9* 32.0* 32.2* 31.6*  MCV 89.0 84.9 86.8 87.7 86.5 89.7 88.5  PLT 88* 88* 82* 72* 70* 69* 74*  -Checked Anemia Panel showed an iron level of 36, UIBC of 166, TIBC of 202, saturation ratios of 18%, ferritin low 281, folate of 5.3, and vitamin B12 417 -Will start folic acid supplement and iron supplements -Continue to Monitor for S/Sx of Bleeding; No overt bleeding noted -Repeat CBC in the AM  Hypokalemia -Patient's K+ Level Trend: Recent Labs  Lab 09/01/22 0845 09/02/22 0349 09/03/22 0143 09/04/22 0155 09/05/22 0447 09/06/22 0359 09/07/22  0244  K 3.1* 3.3* 3.4* 3.7 3.9 3.9 3.9  -Continue to Monitor and Replete as Necessary -Repeat CMP in the AM   Hypoalbuminemia -Patient's Albumin Trend: Recent Labs  Lab 09/02/22 0349 09/03/22 0143 09/03/22 1223 09/04/22 0155 09/05/22 0447 09/06/22 0359 09/07/22 0244  ALBUMIN 2.8* 2.8* 3.0* 2.8* 2.8* 2.7* 2.7*  -Continue to Monitor and Trend and repeat CMP in the AM  DVT prophylaxis: apixaban (ELIQUIS) tablet 2.5 mg Start: 08/30/22 2200 apixaban (ELIQUIS) tablet 2.5 mg    Code Status: Full  Code Family Communication: No family currently at bedside  Disposition Plan:  Level of care: Telemetry Medical Status is: Inpatient Remains inpatient appropriate because: Awaiting SNF placement   Consultants:  Nephrology  Procedures:  As delineated as above  Antimicrobials:  Anti-infectives (From admission, onward)    None       Subjective: Seen and examined at bedside and was sitting in a chair and states that his knee is doing much better.  Denies any nausea or vomiting.  Feels relatively well.  No other concerns or complaints this time but states that he has to go to court sometime this week.  Objective: Vitals:   09/06/22 1725 09/06/22 2043 09/07/22 0401 09/07/22 0913  BP: 139/79 (!) 147/81 (!) 151/85 (!) 149/84  Pulse: 75 73 75 69  Resp: 18 18 18 19   Temp: 98.6 F (37 C) 98.1 F (36.7 C) 98.4 F (36.9 C) 98.1 F (36.7 C)  TempSrc:   Oral Oral  SpO2: 99% 98% 100% 99%  Weight:      Height:        Intake/Output Summary (Last 24 hours) at 09/07/2022 1349 Last data filed at 09/07/2022 0900 Gross per 24 hour  Intake 700 ml  Output 0 ml  Net 700 ml   Filed Weights   09/02/22 0429 09/04/22 0500 09/06/22 0905  Weight: 80.2 kg 81.4 kg 82.4 kg   Examination: Physical Exam:  Constitutional: WN/WD overweight African-American male in no acute distress sitting in the chair at bedside Respiratory: Diminished to auscultation bilaterally, no wheezing, rales, rhonchi or crackles. Normal respiratory effort and patient is not tachypenic. No accessory muscle use.  Cardiovascular: RRR, no murmurs / rubs / gallops. S1 and S2 auscultated.  Minimal extremity edema Abdomen: Soft, non-tender, distended secondary to body. Bowel sounds positive.  GU: Deferred. Musculoskeletal: No clubbing / cyanosis of digits/nails. No joint deformity upper and lower extremities..  Skin: No rashes, lesions, ulcers on limited skin evaluation. No induration; Warm and dry.  Neurologic: CN 2-12 grossly  intact with no focal deficits. Romberg sign and cerebellar reflexes not assessed.  Psychiatric: Normal judgment and insight. Alert and oriented x 3. Normal mood and appropriate affect.   Data Reviewed: I have personally reviewed following labs and imaging studies  CBC: Recent Labs  Lab 09/02/22 0349 09/03/22 0143 09/04/22 0155 09/05/22 0447 09/06/22 0359 09/07/22 0244  WBC 4.6 5.2 5.5 6.7 9.3 10.2  NEUTROABS 2.6  --  3.1 4.3 8.0* 8.9*  HGB 10.9* 10.6* 10.6* 10.4* 10.5* 10.1*  HCT 32.7* 32.2* 32.9* 32.0* 32.2* 31.6*  MCV 84.9 86.8 87.7 86.5 89.7 88.5  PLT 88* 82* 72* 70* 69* 74*   Basic Metabolic Panel: Recent Labs  Lab 09/03/22 0143 09/04/22 0155 09/05/22 0447 09/06/22 0359 09/07/22 0244  NA 142 139 140 139 138  K 3.4* 3.7 3.9 3.9 3.9  CL 111 110 108 109 108  CO2 22 21* 23 22 22   GLUCOSE 79 85 86 170* 111*  BUN 76* 65* 53* 52* 52*  CREATININE 5.07* 4.32* 3.78* 3.71* 3.53*  CALCIUM 7.8* 7.9* 8.4* 8.5* 8.6*  MG  --  1.1* 1.8 2.1 1.8  PHOS 3.1 2.5 2.0* 1.7* 2.8   GFR: Estimated Creatinine Clearance: 20.9 mL/min (A) (by C-G formula based on SCr of 3.53 mg/dL (H)). Liver Function Tests: Recent Labs  Lab 09/03/22 1223 09/04/22 0155 09/05/22 0447 09/06/22 0359 09/07/22 0244  AST 17 21 20 18 20   ALT 16 17 17 16 16   ALKPHOS 120 101 101 96 86  BILITOT 0.6 0.5 0.6 0.5 0.5  PROT 6.2* 5.6* 5.7* 5.7* 5.6*  ALBUMIN 3.0* 2.8* 2.8* 2.7* 2.7*   No results for input(s): "LIPASE", "AMYLASE" in the last 168 hours. Recent Labs  Lab 09/03/22 1223  AMMONIA 33   Coagulation Profile: No results for input(s): "INR", "PROTIME" in the last 168 hours. Cardiac Enzymes: No results for input(s): "CKTOTAL", "CKMB", "CKMBINDEX", "TROPONINI" in the last 168 hours. BNP (last 3 results) Recent Labs    07/14/22 0915  PROBNP 11,718*   HbA1C: No results for input(s): "HGBA1C" in the last 72 hours. CBG: No results for input(s): "GLUCAP" in the last 168 hours. Lipid Profile: No  results for input(s): "CHOL", "HDL", "LDLCALC", "TRIG", "CHOLHDL", "LDLDIRECT" in the last 72 hours. Thyroid Function Tests: No results for input(s): "TSH", "T4TOTAL", "FREET4", "T3FREE", "THYROIDAB" in the last 72 hours. Anemia Panel: No results for input(s): "VITAMINB12", "FOLATE", "FERRITIN", "TIBC", "IRON", "RETICCTPCT" in the last 72 hours. Sepsis Labs: No results for input(s): "PROCALCITON", "LATICACIDVEN" in the last 168 hours.  No results found for this or any previous visit (from the past 240 hour(s)).   Radiology Studies: No results found.  Scheduled Meds:  amLODipine  10 mg Oral Daily   apixaban  2.5 mg Oral BID   aspirin EC  81 mg Oral Daily   atorvastatin  80 mg Oral Daily   brimonidine  1 drop Both Eyes Daily   calcitRIOL  0.25 mcg Oral Q M,W,F   folic acid  1 mg Oral Daily   iron polysaccharides  150 mg Oral Daily   latanoprost  1 drop Both Eyes QHS   metoprolol succinate  25 mg Oral Daily   OLANZapine  15 mg Oral QHS   OLANZapine  5 mg Oral Daily   pantoprazole  80 mg Oral Q1200   [START ON 09/08/2022] predniSONE  50 mg Oral Q breakfast   risperiDONE  1 mg Oral QHS   sertraline  100 mg Oral Daily   sodium bicarbonate  1,300 mg Oral TID   Continuous Infusions:  magnesium sulfate bolus IVPB 2 g (09/07/22 1306)    LOS: 8 days   Marguerita Merles, DO [facility] Available via Epic secure chat 7am-7pm After these hours, please refer to coverage provider listed on [email or web address] 09/07/2022, 1:49 PM

## 2022-09-08 DIAGNOSIS — F200 Paranoid schizophrenia: Secondary | ICD-10-CM | POA: Diagnosis not present

## 2022-09-08 DIAGNOSIS — E785 Hyperlipidemia, unspecified: Secondary | ICD-10-CM | POA: Diagnosis not present

## 2022-09-08 DIAGNOSIS — I10 Essential (primary) hypertension: Secondary | ICD-10-CM | POA: Diagnosis not present

## 2022-09-08 DIAGNOSIS — N179 Acute kidney failure, unspecified: Secondary | ICD-10-CM | POA: Diagnosis not present

## 2022-09-08 LAB — CBC WITH DIFFERENTIAL/PLATELET
Abs Immature Granulocytes: 0.06 10*3/uL (ref 0.00–0.07)
Basophils Absolute: 0 10*3/uL (ref 0.0–0.1)
Basophils Relative: 0 %
Eosinophils Absolute: 0 10*3/uL (ref 0.0–0.5)
Eosinophils Relative: 0 %
HCT: 31.3 % — ABNORMAL LOW (ref 39.0–52.0)
Hemoglobin: 9.9 g/dL — ABNORMAL LOW (ref 13.0–17.0)
Immature Granulocytes: 1 %
Lymphocytes Relative: 6 %
Lymphs Abs: 0.7 10*3/uL (ref 0.7–4.0)
MCH: 28.1 pg (ref 26.0–34.0)
MCHC: 31.6 g/dL (ref 30.0–36.0)
MCV: 88.9 fL (ref 80.0–100.0)
Monocytes Absolute: 0.6 10*3/uL (ref 0.1–1.0)
Monocytes Relative: 5 %
Neutro Abs: 9.1 10*3/uL — ABNORMAL HIGH (ref 1.7–7.7)
Neutrophils Relative %: 88 %
Platelets: 80 10*3/uL — ABNORMAL LOW (ref 150–400)
RBC: 3.52 MIL/uL — ABNORMAL LOW (ref 4.22–5.81)
RDW: 14.9 % (ref 11.5–15.5)
WBC: 10.3 10*3/uL (ref 4.0–10.5)
nRBC: 0 % (ref 0.0–0.2)

## 2022-09-08 LAB — COMPREHENSIVE METABOLIC PANEL
ALT: 29 U/L (ref 0–44)
AST: 35 U/L (ref 15–41)
Albumin: 2.8 g/dL — ABNORMAL LOW (ref 3.5–5.0)
Alkaline Phosphatase: 82 U/L (ref 38–126)
Anion gap: 15 (ref 5–15)
BUN: 56 mg/dL — ABNORMAL HIGH (ref 8–23)
CO2: 22 mmol/L (ref 22–32)
Calcium: 8.3 mg/dL — ABNORMAL LOW (ref 8.9–10.3)
Chloride: 103 mmol/L (ref 98–111)
Creatinine, Ser: 3.72 mg/dL — ABNORMAL HIGH (ref 0.61–1.24)
GFR, Estimated: 17 mL/min — ABNORMAL LOW (ref >60)
Glucose, Bld: 136 mg/dL — ABNORMAL HIGH (ref 70–99)
Potassium: 3.4 mmol/L — ABNORMAL LOW (ref 3.5–5.1)
Sodium: 140 mmol/L (ref 135–145)
Total Bilirubin: 0.2 mg/dL — ABNORMAL LOW (ref 0.3–1.2)
Total Protein: 5.6 g/dL — ABNORMAL LOW (ref 6.5–8.1)

## 2022-09-08 LAB — PHOSPHORUS: Phosphorus: 2.4 mg/dL — ABNORMAL LOW (ref 2.5–4.6)

## 2022-09-08 LAB — MAGNESIUM: Magnesium: 2 mg/dL (ref 1.7–2.4)

## 2022-09-08 MED ORDER — FOLIC ACID 1 MG PO TABS: 1.0000 mg | ORAL_TABLET | Freq: Every day | ORAL | Status: AC

## 2022-09-08 MED ORDER — METOPROLOL SUCCINATE ER 25 MG PO TB24: 25.0000 mg | ORAL_TABLET | Freq: Every day | ORAL | 0 refills | Status: DC

## 2022-09-08 MED ORDER — K PHOS MONO-SOD PHOS DI & MONO 155-852-130 MG PO TABS
500.0000 mg | ORAL_TABLET | Freq: Once | ORAL | Status: AC
  Administered 2022-09-08: 500 mg via ORAL
  Filled 2022-09-08: qty 2

## 2022-09-08 MED ORDER — POLYSACCHARIDE IRON COMPLEX 150 MG PO CAPS: 150.0000 mg | ORAL_CAPSULE | Freq: Every day | ORAL | Status: DC

## 2022-09-08 MED ORDER — POTASSIUM CHLORIDE CRYS ER 20 MEQ PO TBCR
40.0000 meq | EXTENDED_RELEASE_TABLET | Freq: Once | ORAL | Status: AC
  Administered 2022-09-08: 40 meq via ORAL
  Filled 2022-09-08: qty 2

## 2022-09-08 MED ORDER — FUROSEMIDE 40 MG PO TABS: 40.0000 mg | ORAL_TABLET | Freq: Every day | ORAL | Status: DC | PRN

## 2022-09-08 MED ORDER — ACETAMINOPHEN 325 MG PO TABS: 650.0000 mg | ORAL_TABLET | ORAL | 0 refills | Status: AC | PRN

## 2022-09-08 MED ORDER — PREDNISONE 10 MG (21) PO TBPK: ORAL_TABLET | ORAL | 0 refills | Status: DC

## 2022-09-08 NOTE — TOC Transition Note (Signed)
Transition of Care [facility]) - CM/SW Discharge Note   Patient Details  Name: Glenn Hawkins MRN: [id number] Date of Birth: [birth year]  Transition of [facility]) CM/SW Contact:  Ralene Bathe, LCSW Phone Number: 09/08/2022, 1:40 PM   Clinical Narrative:    Patient will DC to: [facility] Anticipated DC date:  09/08/2022 Family notified: Yes Transport by: Safe transport   Per MD patient ready for DC to SNF. RN to call report prior to discharge [phone] room 211-2). RN, patient, and facility notified of DC. Discharge Summary sent to facility. DC packet on chart. Safe transport requested for patient.   CSW will sign off for now as social work intervention is no longer needed. Please consult [place] again if new needs arise.    Final next level of care: Skilled Nursing Facility Barriers to Discharge: Barriers Resolved   Patient Goals and CMS Choice CMS Medicare.gov Compare Post Acute Care list provided to:: Patient Choice offered to / list presented to : Patient  Discharge Placement                Patient [facility] bed at:  [facility]) Patient to be transferred to facility by: Safe Transport Name of family member notified: patient alert and oriented Patient and family notified of of transfer: 09/08/22  Discharge Plan and Services Additional resources added to the After Visit Summary for     Discharge Planning Services: CM Consult Post Acute Care Choice: Home Health          DME Arranged: N/A DME Agency: NA       HH Arranged: PT, OT   Date HH Agency Contacted: 09/02/22 Time HH Agency Contacted: 1535    Social Determinants of Health (SDOH) Interventions SDOH Screenings   Food Insecurity: No Food Insecurity (08/30/2022)  Housing: Low Risk  (08/30/2022)  Transportation Needs: No Transportation Needs (08/30/2022)  Utilities: Not At Risk (08/30/2022)  Financial Resource Strain: Low Risk  (07/18/2022)  Physical Activity: Inactive (08/07/2022)  Social  Connections: Unknown (10/11/2021)   Received from [facility]  Tobacco Use: Medium Risk (08/30/2022)     Readmission Risk Interventions    09/02/2022    3:42 PM  Readmission Risk Prevention Plan  Transportation Screening Complete  PCP or Specialist Appt within 3-5 Days Complete  HRI or Home Care Consult Complete  Social Work Consult for Recovery Care Planning/Counseling Complete  Palliative Care Screening Not Applicable  Medication Review Oceanographer) Referral to Pharmacy

## 2022-09-08 NOTE — TOC Progression Note (Signed)
Transition of Care [facility]) - Initial/Assessment Note    Patient Details  Name: Glenn Hawkins MRN: [id number] Date of Birth: November 14, 1955  Transition of [facility]) CM/SW Contact:    Ralene Bathe, LCSW Phone Number: 09/08/2022, 10:16 AM  Clinical Narrative:                 LCSW contacted the facility and verified that the bed is still available for patient should he be medically ready to discharge today.  Care team informed.  TOC following.   Expected Discharge Plan: Home w Home Health Services Barriers to Discharge: Continued Medical Work up   Patient Goals and CMS Choice Patient states their goals for this hospitalization and ongoing recovery are:: To return home CMS Medicare.gov Compare Post Acute Care list provided to:: Patient Choice offered to / list presented to : Patient      Expected Discharge Plan and Services   Discharge Planning Services: CM Consult Post Acute Care Choice: Home Health Living arrangements for the past 2 months: Single Family Home                 DME Arranged: N/A DME Agency: NA       HH Arranged: PT, OT   Date HH Agency Contacted: 09/02/22 Time HH Agency Contacted: 1535    Prior Living Arrangements/Services Living arrangements for the past 2 months: Single Family Home Lives with:: Self Patient language and need for interpreter reviewed:: Yes Do you feel safe going back to the place where you live?: Yes      Need for Family Participation in Patient Care: Yes (Comment) Care giver support system in place?: Yes (comment) Current home services: DME (Cane, walker, scale) Criminal Activity/Legal Involvement Pertinent to Current Situation/Hospitalization: No - Comment as needed  Activities of Daily Living Home Assistive Devices/Equipment: Walker (specify type) ADL Screening (condition at time of admission) Patient's cognitive ability adequate to safely complete daily activities?: Yes Is the patient deaf or have difficulty hearing?: No Does  the patient have difficulty seeing, even when wearing glasses/contacts?: No Does the patient have difficulty concentrating, remembering, or making decisions?: No Patient able to express need for assistance with ADLs?: Yes Does the patient have difficulty dressing or bathing?: No Independently performs ADLs?: Yes (appropriate for developmental age) Does the patient have difficulty walking or climbing stairs?: No Weakness of Legs: None Weakness of Arms/Hands: None  Permission Sought/Granted Permission sought to share information with : Facility Medical sales representative, Family Supports Permission granted to share information with : Yes, Verbal Permission Granted              Emotional Assessment Appearance:: Appears stated age Attitude/Demeanor/Rapport: Engaged, Gracious Affect (typically observed): Accepting, Appropriate, Calm, Hopeful, Pleasant Orientation: : Oriented to Self, Oriented to Place, Oriented to  Time, Oriented to Situation Alcohol / Substance Use: Not Applicable Psych Involvement: No (comment)  Admission diagnosis:  AKI (acute kidney injury) (HCC) [N17.9] Patient Active Problem List   Diagnosis Date Noted   AKI (acute kidney injury) (HCC) 08/30/2022   Severe mitral regurgitation 07/16/2022   Acute on chronic systolic heart failure (HCC) 07/03/2022   DOE (dyspnea on exertion) 05/16/2022   Combined systolic and diastolic congestive heart failure (HCC) 05/06/2022   Acute respiratory failure with hypoxia (HCC) 05/06/2022   Elevated troponin 05/06/2022   Iron deficiency anemia 05/06/2022   History of DVT (deep vein thrombosis) 05/06/2022   Chronic anticoagulation 05/06/2022   Thrombocytopenia (HCC) 05/06/2022   Paranoid schizophrenia (HCC) 05/06/2022   Unintentional  weight loss 11/08/2020   Pancreatic lesion 11/08/2020   Increased ammonia level 11/08/2020   Abdominal pain 11/08/2020   Diarrhea 11/08/2020   Indigestion 11/08/2020   Abnormality of abdominal aorta  11/08/2020   Hyperlipidemia LDL goal <70    Hemorrhoids 04/18/2015   Colon polyps 04/18/2015   Rectal bleed 11/08/2014   CKD (chronic kidney disease) stage 3, GFR 30-59 ml/min (HCC) 11/08/2014   Hyperglycemia 11/08/2014   GERD without esophagitis 11/08/2014   Obesity 11/08/2014   Rectal bleeding 11/08/2014   Acute deep vein thrombosis (DVT) of femoral vein of right lower extremity (HCC) 10/18/2014   Chronic systolic heart failure (HCC) 12/09/2013   Cardiomyopathy, ischemic 06/07/2013   Mitral valve disorder    Coronary atherosclerosis of native coronary artery    Hypertension    Hyperlipidemia    PCP:  [name], MD Pharmacy:   [facility] [phone] - [place], [place] - [address] AT [facility] [address] [place] [address] Phone: [phone] Fax: [phone]  [facility] - [place], [place] - [address] [place] [address] Phone: [phone] Fax: [phone]  [facility] - [place], [place] - [address] [place] [address] Phone: [phone] Fax: [phone]  [name] [address] [place] [address] Phone: [phone] Fax: [phone]     Social Determinants of Health (SDOH) Social History: SDOH Screenings   Food Insecurity: No Food Insecurity (08/30/2022)  Housing: Low Risk  (08/30/2022)  Transportation Needs: No Transportation Needs (08/30/2022)  Utilities: Not At Risk (08/30/2022)  Financial Resource Strain: Low Risk  (07/18/2022)  Physical Activity: Inactive (08/07/2022)  Social Connections: Unknown (10/11/2021)   Received from [facility]  Tobacco Use: Medium Risk (08/30/2022)   SDOH Interventions: Transportation Interventions: Intervention Not Indicated, Inpatient TOC, Patient Resources (Friends/Family)   Readmission Risk Interventions    09/02/2022     3:42 PM  Readmission Risk Prevention Plan  Transportation Screening Complete  PCP or Specialist Appt within 3-5 Days Complete  HRI or Home Care Consult Complete  Social Work Consult for Recovery Care Planning/Counseling Complete  Palliative Care Screening Not Applicable  Medication Review Oceanographer) Referral to Pharmacy

## 2022-09-08 NOTE — Discharge Summary (Signed)
Physician Discharge Summary   Patient: Glenn Hawkins MRN: [id number] DOB: 25-Jul-1955  Admit date:     08/30/2022  Discharge date: 09/08/22  Discharge Physician: Marguerita Merles, DO   PCP: [name], MD   Recommendations at discharge:    Follow up with PCP within 1-2 weeks and repeat CBC,CMP, Mag, Phos within 1 week Follow up with Nephrology within 1-2 weeks  Discharge Diagnoses: Principal Problem:   AKI (acute kidney injury) (HCC) Active Problems:   Hypertension   Hyperlipidemia   Paranoid schizophrenia (HCC)   Coronary atherosclerosis of native coronary artery   Cardiomyopathy, ischemic   Chronic systolic heart failure (HCC)   Acute deep vein thrombosis (DVT) of femoral vein of right lower extremity (HCC)   Obesity  Resolved Problems:   * No resolved hospital problems. [facility] Course: The patient is a 67 year old chronically ill-appearing African-American male with a past medical history significant for but limited to hypertension, diabetes mellitus type 2, hyperlipidemia, CAD, history of chronic systolic CHF and ischemic cardiomyopathy, history of severe mitral regurgitation, schizophrenia, iron deficiency anemia, CKD stage IV as well as other comorbidities who presented to [facility] facility with a 2-week history of fatigue, anorexia and intermittent diarrhea.  He was seen in the ED and saturations were noted to be 99%.  Chest x-ray was done within normal limits.  Lab work was showing a bicarbonate level of 13, BUN of 114 and creatinine of 9.77.  Mag level is low and he is scheduled to [name] for further evaluation management of AKI on CKD stage IV.    Nephrology was consulted and patient was hydrated.  Renal function continues to improve daily.  Intemittently appears somonlent and drowsy.  PT OT was recommending home health but Sister says he does not have 24/7 care that PT is recommending. PT/OT now recommending SNF. Patient does not want to go to SNF however he has  changed his mind and agreeable to Rehab. Awaiting placement and appears medically stable for Discharge.   Assessment and Plan:  AKI on CKD stage IV versus chronic progressive kidney disease, improving and stabilizing  -Likely secondary to prerenal azotemia in the setting of recent diarrhea, concomitant SGLT2 use, spironolactone, furosemide.   -Patient noted to have  started recently on Jardiance, approximately a month prior to admission.   -Patient admitted with acute AKI/ARF on chronic CKD stage IV with a creatinine noted on admission of 9.35. -Last creatinine noted on 07/16/2022 of 3.40. -BUN/Cr Trend: Recent Labs  Lab 09/02/22 0349 09/03/22 0143 09/04/22 0155 09/05/22 0447 09/06/22 0359 09/07/22 0244 09/08/22 0045  BUN 91* 76* 65* 53* 52* 52* 56*  CREATININE 6.11* 5.07* 4.32* 3.78* 3.71* 3.53* 3.72*  -Urinalysis done with large hemoglobin, 100 of protein, 11-20 WBCs. -Renal ultrasound done negative for hydronephrosis. - Intake/Output Summary (Last 24 hours) at 09/08/2022 1310 Last data filed at 09/08/2022 0800 Gross per 24 hour  Intake 890 ml  Output --  Net 890 ml   -Renal function improving daily but Nephrology probably suspects that we will continue to see improvement but as a baseline is fairly advanced already -Bicarb drip discontinued per nephrology yesterday. -Monitor urine output follow renal function. -Avoid Nephrotoxic Medications, Contrast Dyes, Hypotension and Dehydration to Ensure Adequate Renal Perfusion and will need to Renally Adjust Meds -Continue to Monitor and Trend Renal Function carefully and repeat CMP in the AM  -Nephrology following and appreciate input and recommendations and they are recommending to follow-up in the outpatient setting  in 1 to 2 weeks and continue to hold spironolactone, Jardiance and Lasix and resume Lasix if he has edema. -PT/OT initially recommending home health but he needs 24/7 per PT/OT and sister says he does not have that so PT/OT  re-evaluated and Recommending SNF and patient is now agreeable and awaiting placement    Metabolic Acidosis, improved  -Likely secondary to problem #1. -Patient with no signs or symptoms of infection. -Acidosis improved on bicarb drip but slightly dropped.  Now CO2 is 22, anion gap is 15, chloride level is 103  -Continue with sodium bicarb at 1300 mg p.o. 3 times daily -Bicarb drip discontinued per nephrology yesterday.   -Nephrology following appreciate further evaluation and assistance.     Diarrhea -Resolved.  Lethargy and Somnolence, improved -Received Ambien last night -Arousable and answers questions appropriately but falls asleep easily -Ammonia Level was elevated a few days ago but improved and went from 60 -> 33 -If persist may consider obtaining ABG and TSH, RPR, B12   Ischemic Cardiomyopathy/CAD -EF 40 to 45% with severe MR. -Patient noted to be dehydrated on admission and placed on IV fluids with gentle hydration.   -IV fluids discontinued -Continue to hold diuretics of spironolactone and Lasix.   -Continue Aspirin 81 mg po daily and Atorvastatin 80 mg po Daily -C/w Toprol-XL at a reduced dose of 25 mg daily.   -CXR done and showed "There is increased density in right parahilar region suggesting possible pneumonia. Less likely possibility would be asymmetric pulmonary edema. Increased markings are seen in the left lower lung field which may suggest crowding of bronchovascular structures due to elevation of left hemidiaphragm or atelectasis/pneumonia." -Will provide Flutter Valve and Incentive Spirometry   History of Paranoid Schizophrenia -Stable on home regimen of Olanzapine 5 mg po Daily and 15 mg po qHS, Risperidone 1 mg po qHS, and Sertraline 100 mg po Daily    History of Right Lower Extremity DVT -Continue Anticoagulation with Apixaban .    Hypertension -Continue with Amlodipine 10 mg po Daily and Metoprolol Succinate 25 mg po daily -Continue to Monitor BP per  Protocol -Last BP Reading was 151/90   Hyperlipidemia -Continue Atorvastatin 80 mg po Daily  Hypophosphatemia -Phos Level Trend: Recent Labs  Lab 09/02/22 0349 09/03/22 0143 09/04/22 0155 09/05/22 0447 09/06/22 0359 09/07/22 0244 09/08/22 0045  PHOS 3.7 3.1 2.5 2.0* 1.7* 2.8 2.4*  -Continue to Monitor and Replete as Necessary -Repeat Phos Level in the AM  Left Knee Pain, improving  -Start Prednisone Taper and improving;p will go to 50 mg po daily -Concern for Gout but Right anterior knee is tender to palpate and swollen -Obtained DG X-Ray of the the Knee and showed "No acute findings are seen in the bony structures of left knee.There is no significant effusion. There is soft tissue swelling anterior to the proximal tibia at the attachment of infrapatellar tendon suggesting bursitis or tendinosis. Arteriosclerosis." -Check Uric Acid Level in the AM -Order Ice  -Will consider Ortho Evaluation if not improving but can bee seen in the outpatient setting    Hypomagnesemia -Patient's Mag Level Trend: Recent Labs  Lab 09/04/22 0155 09/05/22 0447 09/06/22 0359 09/07/22 0244 09/08/22 0045  MG 1.1* 1.8 2.1 1.8 2.0  -Continue to Monitor and Replete as Necessary -Repeat Mag in the AM    Normocytic Anemia/Anemia of Chronic Kidney Disease Thrombocytopenia -Hgb/Hct and Platelet Count Trend: Recent Labs  Lab 09/02/22 0349 09/03/22 0143 09/04/22 0155 09/05/22 0447 09/06/22 0359 09/07/22 0244 09/08/22 0045  HGB 10.9* 10.6* 10.6* 10.4* 10.5* 10.1* 9.9*  HCT 32.7* 32.2* 32.9* 32.0* 32.2* 31.6* 31.3*  MCV 84.9 86.8 87.7 86.5 89.7 88.5 88.9  PLT 88* 82* 72* 70* 69* 74* 80*  -Checked Anemia Panel showed an iron level of 36, UIBC of 166, TIBC of 202, saturation ratios of 18%, ferritin low 281, folate of 5.3, and vitamin B12 417 -Will start folic acid supplement and iron supplements -Continue to Monitor for S/Sx of Bleeding; No overt bleeding noted -Repeat CBC in the  AM  Hypokalemia -Patient's K+ Level Trend: Recent Labs  Lab 09/02/22 0349 09/03/22 0143 09/04/22 0155 09/05/22 0447 09/06/22 0359 09/07/22 0244 09/08/22 0045  K 3.3* 3.4* 3.7 3.9 3.9 3.9 3.4*  -Replete with po Kcl 40 mEQ x1 and po K Phos Neutral 500 mg x1 -Continue to Monitor and Replete as Necessary -Repeat CMP in the AM   Hypoalbuminemia -Patient's Albumin Trend: Recent Labs  Lab 09/03/22 0143 09/03/22 1223 09/04/22 0155 09/05/22 0447 09/06/22 0359 09/07/22 0244 09/08/22 0045  ALBUMIN 2.8* 3.0* 2.8* 2.8* 2.7* 2.7* 2.8*  -Continue to Monitor and Trend and repeat CMP in the AM  Consultants: Nephrology Procedures performed: As delineated as above  Disposition: Skilled nursing facility Diet recommendation:  Renal diet DISCHARGE MEDICATION: Allergies as of 09/08/2022       Reactions   Nifedipine Diarrhea   Aciphex [rabeprazole Sodium] Diarrhea   Benazepril Other (See Comments)   Unknown   Haloperidol Anxiety   Causes severe anxiety   Prilosec [omeprazole] Diarrhea        Medication List     STOP taking these medications    empagliflozin 10 MG Tabs tablet Commonly known as: Jardiance   hydrALAZINE 50 MG tablet Commonly known as: APRESOLINE   isosorbide mononitrate 30 MG 24 hr tablet Commonly known as: IMDUR   metoprolol tartrate 100 MG tablet Commonly known as: LOPRESSOR   potassium chloride SA 20 MEQ tablet Commonly known as: KLOR-CON M       TAKE these medications    acetaminophen 325 MG tablet Commonly known as: TYLENOL Take 2 tablets (650 mg total) by mouth every 4 (four) hours as needed for fever, headache or mild pain.   amLODipine 10 MG tablet Commonly known as: NORVASC Take 1 tablet by mouth daily.   aspirin EC 81 MG tablet Take 81 mg by mouth daily. Swallow whole.   atorvastatin 80 MG tablet Commonly known as: LIPITOR Take 1 tablet (80 mg total) by mouth daily.   brimonidine 0.2 % ophthalmic solution Commonly known as:  ALPHAGAN Place 1 drop into both eyes daily.   calcitRIOL 0.25 MCG capsule Commonly known as: ROCALTROL Take 0.25 mcg by mouth every Monday, Wednesday, and Friday.   colchicine 0.6 MG tablet Take 0.5 tablets (0.3 mg total) by mouth daily.   Daridorexant HCl 25 MG Tabs Take 25 mg by mouth at bedtime.   DOCUSATE SODIUM PO Take 1 capsule by mouth daily.   Eliquis 5 MG Tabs tablet Generic drug: apixaban Take 2.5 mg by mouth 2 (two) times daily.   esomeprazole 20 MG capsule Commonly known as: NexIUM Take 1 capsule (20 mg total) by mouth daily before breakfast.   folic acid 1 MG tablet Commonly known as: FOLVITE Take 1 tablet (1 mg total) by mouth daily. Start taking on: September 09, 2022   furosemide 40 MG tablet Commonly known as: LASIX Take 1 tablet (40 mg total) by mouth daily as needed for edema or fluid. What changed:  when to take this reasons to take this   iron polysaccharides 150 MG capsule Commonly known as: NIFEREX Take 1 capsule (150 mg total) by mouth daily. Start taking on: September 09, 2022   latanoprost 0.005 % ophthalmic solution Commonly known as: XALATAN Place 1 drop into both eyes at bedtime.   melatonin 3 MG Tabs tablet Take 3 mg by mouth at bedtime.   metoprolol succinate 25 MG 24 hr tablet Commonly known as: TOPROL-XL Take 1 tablet (25 mg total) by mouth daily. Start taking on: September 09, 2022 What changed:  medication strength how much to take additional instructions   nitroGLYCERIN 0.4 MG SL tablet Commonly known as: Nitrostat Dissolve 1 tablet under the tongue every 5 minutes as needed for chest pain. Max of 3 doses, then 911.   OLANZapine 10 MG tablet Commonly known as: ZYPREXA Take 5-15 mg by mouth See admin instructions. Take 5 mg in the morning and 15 mg at night   predniSONE 10 MG (21) Tbpk tablet Commonly known as: STERAPRED UNI-PAK 21 TAB Take 6 tablets on day 1, 5 tablets on day 2, 4 tablets on day 3, 3 tablets on day 4, 2 tablets  on day 5, 1 tablet on day 6 and then stop   risperiDONE 1 MG tablet Commonly known as: RISPERDAL Take 1 tablet (1 mg total) by mouth at bedtime.   sertraline 100 MG tablet Commonly known as: ZOLOFT Take 100 mg by mouth daily.   sodium bicarbonate 650 MG tablet Take 650 mg by mouth 2 (two) times daily.        Follow-up Information     [facility] Follow up.   Why: Someone will call you to schedule first home visit. If you have not received a call after two days of discharging home, call their number listed. If no one comes to assess, call Case Manager at [phone]. Contact information: [address] [place] [address]  [phone]               Discharge Exam: Ceasar Mons Weights   09/04/22 0500 09/06/22 0905 09/08/22 0500  Weight: 81.4 kg 82.4 kg 85.1 kg   Vitals:   09/08/22 0539 09/08/22 0821  BP: (!) 160/82 (!) 151/90  Pulse: 91   Resp: 12   Temp: 98 F (36.7 C) 98.2 F (36.8 C)  SpO2: 99% 96%   Examination: Physical Exam:  Constitutional: WN/WD overweight AAM in NAD Respiratory: Diminished to auscultation bilaterally, no wheezing, rales, rhonchi or crackles. Normal respiratory effort and patient is not tachypenic. No accessory muscle use. Unlabored breathing  Cardiovascular: RRR, no murmurs / rubs / gallops. S1 and S2 auscultated. Mild LE edema Abdomen: Soft, non-tender, Distended 2/2 body habitus. Bowel sounds positive.  GU: Deferred. Musculoskeletal: No clubbing / cyanosis of digits/nails. No joint deformity upper and lower extremities.   Skin: No rashes, lesions, ulcers on a limited skin evaluation. No induration; Warm and dry.  Neurologic: CN 2-12 grossly intact with no focal deficits. Romberg sign and cerebellar reflexes not assessed.  Psychiatric: Normal judgment and insight. Alert and oriented x 3. Normal mood and appropriate affect.   Condition at discharge: stable  The results of significant diagnostics from this  hospitalization (including imaging, microbiology, ancillary and laboratory) are listed below for reference.   Imaging Studies: DG Knee 1-2 Views Left  Result Date: 09/05/2022 CLINICAL DATA:  Pain EXAM: LEFT KNEE - 1-2 VIEW COMPARISON:  None Available. FINDINGS: No recent fracture or dislocation  is seen. There is no effusion in suprapatellar bursa. Sclerotic densities are seen in distal shaft of femur around the proximal shaft of tibia, possibly benign process such as old bone infarcts. There is soft tissue swelling anterior to the proximal tibia at the attachment of infrapatellar tendon. Scattered arterial calcifications are seen in the soft tissues. IMPRESSION: No acute findings are seen in the bony structures of left knee. There is no significant effusion. There is soft tissue swelling anterior to the proximal tibia at the attachment of infrapatellar tendon suggesting bursitis or tendinosis. Arteriosclerosis. Electronically Signed   By: Ernie Avena M.D.   On: 09/05/2022 11:54   DG CHEST PORT 1 VIEW  Result Date: 09/04/2022 CLINICAL DATA:  Shortness of breath EXAM: PORTABLE CHEST 1 VIEW COMPARISON:  Previous studies including the examination of 08/30/2022 FINDINGS: Transverse diameter of heart is increased. There is marked elevation of left hemidiaphragm. Increased density is seen in right parahilar region. There is crowding of markings in left lower lung field. There is no significant pleural effusion or pneumothorax. IMPRESSION: There is increased density in right parahilar region suggesting possible pneumonia. Less likely possibility would be asymmetric pulmonary edema. Increased markings are seen in the left lower lung field which may suggest crowding of bronchovascular structures due to elevation of left hemidiaphragm or atelectasis/pneumonia. Electronically Signed   By: Ernie Avena M.D.   On: 09/04/2022 07:57   US RENAL  Result Date: 08/30/2022 CLINICAL DATA:  Renal dysfunction  EXAM: RENAL / URINARY TRACT ULTRASOUND COMPLETE COMPARISON:  None Available. FINDINGS: Right Kidney: Renal measurements: 9.5 x 5.1 x 4.2 cm = volume: 107.9 mL. There is no hydronephrosis. There is increased cortical echogenicity. There are multiple cysts of varying sizes in the right kidney. Largest of the cysts measures 2.3 cm in maximum diameter. Left Kidney: Renal measurements: 10.4 x 6.4 x 5.3 cm = volume: 155.4 mL. There is no hydronephrosis. There is increased cortical echogenicity. Multiple cysts are seen in left kidney largest measuring 4.7 cm in maximum diameter. Bladder: Urinary bladder is empty and not evaluated. Other: None. IMPRESSION: There is no hydronephrosis. There is increased cortical echogenicity suggesting medical renal disease. Multiple cysts of varying sizes are noted in both kidneys. Electronically Signed   By: Ernie Avena M.D.   On: 08/30/2022 19:02    Microbiology: Results for orders placed or performed during the hospital encounter of 07/16/22  SARS Coronavirus 2 by RT PCR (hospital order, performed in [facility] hospital lab) *cepheid single result test* Anterior Nasal Swab     Status: None   Collection Time: 07/16/22  8:51 AM   Specimen: Anterior Nasal Swab  Result Value Ref Range Status   SARS Coronavirus 2 by RT PCR NEGATIVE NEGATIVE Final    Comment: Performed at [facility] Lab, [address], [place], [place] [address]  Surgical pcr screen     Status: None   Collection Time: 07/16/22  8:58 AM   Specimen: Nasal Mucosa; Nasal Swab  Result Value Ref Range Status   MRSA, PCR NEGATIVE NEGATIVE Final   Staphylococcus aureus NEGATIVE NEGATIVE Final    Comment: (NOTE) The Xpert SA Assay (FDA approved for NASAL specimens in patients 8 years of age and older), is one component of a comprehensive surveillance program. It is not intended to diagnose infection nor to guide or monitor treatment. Performed at [facility], [address], [place],  [place] [address]    Labs: CBC: Recent Labs  Lab 09/04/22 0155  09/05/22 0447 09/06/22 0359 09/07/22 0244 09/08/22 0045  WBC 5.5 6.7 9.3 10.2 10.3  NEUTROABS 3.1 4.3 8.0* 8.9* 9.1*  HGB 10.6* 10.4* 10.5* 10.1* 9.9*  HCT 32.9* 32.0* 32.2* 31.6* 31.3*  MCV 87.7 86.5 89.7 88.5 88.9  PLT 72* 70* 69* 74* 80*   Basic Metabolic Panel: Recent Labs  Lab 09/04/22 0155 09/05/22 0447 09/06/22 0359 09/07/22 0244 09/08/22 0045  NA 139 140 139 138 140  K 3.7 3.9 3.9 3.9 3.4*  CL 110 108 109 108 103  CO2 21* 23 22 22 22   GLUCOSE 85 86 170* 111* 136*  BUN 65* 53* 52* 52* 56*  CREATININE 4.32* 3.78* 3.71* 3.53* 3.72*  CALCIUM 7.9* 8.4* 8.5* 8.6* 8.3*  MG 1.1* 1.8 2.1 1.8 2.0  PHOS 2.5 2.0* 1.7* 2.8 2.4*   Liver Function Tests: Recent Labs  Lab 09/04/22 0155 09/05/22 0447 09/06/22 0359 09/07/22 0244 09/08/22 0045  AST 21 20 18 20  35  ALT 17 17 16 16 29   ALKPHOS 101 101 96 86 82  BILITOT 0.5 0.6 0.5 0.5 0.2*  PROT 5.6* 5.7* 5.7* 5.6* 5.6*  ALBUMIN 2.8* 2.8* 2.7* 2.7* 2.8*   CBG: No results for input(s): "GLUCAP" in the last 168 hours.  Discharge time spent: greater than 30 minutes.  Signed: Marguerita Merles, DO [facility] 09/08/2022

## 2022-09-08 NOTE — Plan of Care (Signed)

## 2022-09-11 DIAGNOSIS — I255 Ischemic cardiomyopathy: Secondary | ICD-10-CM | POA: Diagnosis not present

## 2022-09-11 DIAGNOSIS — I129 Hypertensive chronic kidney disease with stage 1 through stage 4 chronic kidney disease, or unspecified chronic kidney disease: Secondary | ICD-10-CM | POA: Diagnosis not present

## 2022-09-11 DIAGNOSIS — Z7901 Long term (current) use of anticoagulants: Secondary | ICD-10-CM | POA: Diagnosis not present

## 2022-09-11 DIAGNOSIS — Z299 Encounter for prophylactic measures, unspecified: Secondary | ICD-10-CM | POA: Diagnosis not present

## 2022-09-11 DIAGNOSIS — Z09 Encounter for follow-up examination after completed treatment for conditions other than malignant neoplasm: Secondary | ICD-10-CM | POA: Diagnosis not present

## 2022-09-11 DIAGNOSIS — I10 Essential (primary) hypertension: Secondary | ICD-10-CM | POA: Diagnosis not present

## 2022-09-11 DIAGNOSIS — N184 Chronic kidney disease, stage 4 (severe): Secondary | ICD-10-CM | POA: Diagnosis not present

## 2022-09-11 DIAGNOSIS — F200 Paranoid schizophrenia: Secondary | ICD-10-CM | POA: Diagnosis not present

## 2022-09-11 DIAGNOSIS — I739 Peripheral vascular disease, unspecified: Secondary | ICD-10-CM | POA: Diagnosis not present

## 2022-09-11 DIAGNOSIS — E1122 Type 2 diabetes mellitus with diabetic chronic kidney disease: Secondary | ICD-10-CM | POA: Diagnosis not present

## 2022-09-11 DIAGNOSIS — D509 Iron deficiency anemia, unspecified: Secondary | ICD-10-CM | POA: Diagnosis not present

## 2022-09-11 DIAGNOSIS — I5022 Chronic systolic (congestive) heart failure: Secondary | ICD-10-CM | POA: Diagnosis not present

## 2022-09-11 DIAGNOSIS — I340 Nonrheumatic mitral (valve) insufficiency: Secondary | ICD-10-CM | POA: Diagnosis not present

## 2022-09-11 DIAGNOSIS — N179 Acute kidney failure, unspecified: Secondary | ICD-10-CM | POA: Diagnosis not present

## 2022-09-11 DIAGNOSIS — M6281 Muscle weakness (generalized): Secondary | ICD-10-CM | POA: Diagnosis not present

## 2022-09-15 ENCOUNTER — Telehealth: Payer: Self-pay | Admitting: *Deleted

## 2022-09-15 NOTE — Progress Notes (Signed)
  Care Coordination Note  09/15/2022 Name: Glenn Hawkins MRN: [id number] DOB: Sep 08, 1955  Glenn Hawkins is a 67 y.o. year old male who is a primary care patient of Doreen Beam B, MD and is actively engaged with the care management team. I reached out to [name] by phone today to assist with re-scheduling a follow up visit with the RN Case Manager  Follow up plan: Telephone appointment with care management team member scheduled for:10/08/22  [facility] Coordination Care Guide  Direct Dial: [phone]

## 2022-09-16 ENCOUNTER — Ambulatory Visit: Payer: Medicare Other | Attending: Cardiovascular Disease | Admitting: Cardiovascular Disease

## 2022-09-16 ENCOUNTER — Encounter: Payer: Self-pay | Admitting: Cardiovascular Disease

## 2022-09-16 VITALS — BP 138/88 | HR 58 | Ht 68.0 in | Wt 184.0 lb

## 2022-09-16 DIAGNOSIS — I5022 Chronic systolic (congestive) heart failure: Secondary | ICD-10-CM

## 2022-09-16 DIAGNOSIS — I739 Peripheral vascular disease, unspecified: Secondary | ICD-10-CM

## 2022-09-16 DIAGNOSIS — I10 Essential (primary) hypertension: Secondary | ICD-10-CM | POA: Diagnosis not present

## 2022-09-16 DIAGNOSIS — E785 Hyperlipidemia, unspecified: Secondary | ICD-10-CM

## 2022-09-16 DIAGNOSIS — I2510 Atherosclerotic heart disease of native coronary artery without angina pectoris: Secondary | ICD-10-CM | POA: Diagnosis not present

## 2022-09-16 MED ORDER — METOPROLOL SUCCINATE ER 100 MG PO TB24: 100.0000 mg | ORAL_TABLET | Freq: Every day | ORAL | 1 refills | Status: DC

## 2022-09-16 MED ORDER — HYDRALAZINE HCL 25 MG PO TABS: 25.0000 mg | ORAL_TABLET | Freq: Three times a day (TID) | ORAL | 5 refills | Status: DC

## 2022-09-16 NOTE — Progress Notes (Signed)
Cardiology Office Note   Date:  09/16/2022   ID:  Glenn Hawkins, DOB 12/24/1955, MRN [id number]  PCP:  [name], MD  Cardiologist:   [name], MD   No chief complaint on file.      History of Present Illness: Glenn Hawkins is a 67 y.o. male who presents for a followup visit regarding coronary artery disease, chronic systolic heart failure and mitral regurgitation.  He has known history of coronary artery disease status post myocardial infarction with late presentation in 2013. He had a nuclear stress test in January of 2013 which showed mostly a large transmural infarct in the left circumflex distribution without significant ischemia. EF was 40-45% with moderate mitral regurgitation.  He had extensive right leg DVT in August 2016 and has been on anticoagulation since then. He also has  bilateral calf claudication due to suspected inflow disease.  He was hospitalized in March of this year with heart failure and improved with diuresis.  Echocardiogram showed an EF of 35 to 40% with severe mitral regurgitation.  He underwent a right and left cardiac catheterization in May which showed subtotal occlusion in the mid left circumflex and mid right coronary artery with collaterals from the LAD.  He presented for mitral valve clip and make but TEE showed that his mitral regurgitation was only mild at that time and thus the case was canceled.  Hospitalized recently at [facility] after he presented with fatigue, anorexia and diarrhea.  He was noted to have acute on chronic kidney disease with creatinine of 9.7 and BUN of 114.  Fortunately, his renal function improved with hydration.  Jardiance and spironolactone were discontinued.  In addition, his furosemide was suspended but subsequently resumed as an outpatient.  He is feeling better overall and denies chest pain or worsening dyspnea.  He resumed Lasix at 40 mg twice daily.  He has been taking Toprol 100 mg twice.  Instead of once  daily.  He ran out of hydralazine.  Past Medical History:  Diagnosis Date   Aortic insufficiency    a. 08/2019 Echo: mild to mod AI.   C. 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 02/2011 Late presentation MI-->Myoview w/ large area of transmural infarct in LCX distribution, no ischemia.   Depressive disorder, not elsewhere classified    Dyspnea    Esophageal reflux    Gout, unspecified    Heart murmur    Mitral valve   Hemorrhoids    a. 01/2016 s/p hemorrhoidectomy.   HFrEF (heart failure with reduced ejection fraction) (HCC)    a. 04/2012 Echo: EF 40%, inflat AK, Gr1 DD, mild AI/MR, triv TR; b. 11/2017 EchoP EF 30-35%, inflat HK; c. 08/2019 Echo: EF 30-35%, gr1 DD, inflat AK.   History of DVT (deep vein thrombosis)    a. Chronic Eliquis.   Hyperlipidemia LDL goal <70    Hypertension    Hypotension, unspecified    Ischemic cardiomyopathy    a. 04/2012 Echo: EF 40%, inflat AK, Gr1 DD, mild AI/MR, triv TR; b. 11/2017 EchoP EF 30-35%, inflat HK; c. 08/2019 Echo: EF 30-35%, gr1 DD, inflat AK. Nl RV size/fxn. Mild MR. Mild to mod AI.   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 04/2012 Echo: EF 40%,  mild MR.   Myocardial infarction (lateral wall) (HCC) 2013   a. 02/2011 - late presentation. Managed conservatively 2/2 CKD;  b. 02/2011 Myoview: Large transmural infarct in the LCX distribution, no ischemia.   PAD (peripheral artery disease) (HCC)    a. 08/2015 ABI/duplex: R: 0.86, L 0.96. Duplex w/ bilateral heterogeneous plaque in mid fem arteries, no significant stenoses; b. 08/2019 ABI/Duplex: prob bilat inflow dzs w/ stable ABIs (R 0.85, L 0.93).   Personal history of tobacco use, presenting hazards to health    Torn ligament    Unspecified schizophrenia, unspecified condition     Past  Surgical History:  Procedure Laterality Date   BIOPSY  12/17/2020   Procedure: BIOPSY;  Surgeon: Lanelle Bal, DO;  [facility];  Service: Endoscopy;;   COLONOSCOPY     COLONOSCOPY N/A 11/10/2014   Procedure: COLONOSCOPY;  Surgeon: Ruffin Frederick, MD;  Location: [facility] ENDOSCOPY;  Service: Gastroenterology;  Laterality: N/A;   COLONOSCOPY WITH PROPOFOL N/A 05/08/2015   Surgeon: West Bali, MD; nonthrombosed external hemorrhoids, one 8 mm tubular adenoma, one 4 mm tubular adenoma.  Repeat colonoscopy in 5-10 years.   COLONOSCOPY WITH PROPOFOL N/A 12/17/2020   Procedure: COLONOSCOPY WITH PROPOFOL;  Surgeon: Lanelle Bal, DO;  Location: [facility];  Service: Endoscopy;  Laterality: N/A;  8:30am   ESOPHAGOGASTRODUODENOSCOPY (EGD) WITH PROPOFOL N/A 05/08/2015   Surgeon: West Bali, MD; LA grade a reflux esophagitis, normal stomach and duodenum.   ESOPHAGOGASTRODUODENOSCOPY (EGD) WITH PROPOFOL N/A 12/17/2020   Procedure: ESOPHAGOGASTRODUODENOSCOPY (EGD) WITH PROPOFOL;  Surgeon: Lanelle Bal, DO;  [facility];  Service: Endoscopy;  Laterality: N/A;   EYE SURGERY Right    removal of foreign body   HEMORRHOID SURGERY N/A 02/20/2016   Procedure: EXTENSIVE HEMORRHOIDECTOMY;  Surgeon: Franky Macho, MD;  Location: [facility] ORS;  Service: General;  Laterality: N/A;   MITRAL VALVE REPAIR N/A 07/16/2022   Procedure: MITRAL VALVE REPAIR;  Surgeon: Tonny Bollman, MD;  Location: [facility] INVASIVE CV LAB;  Service: Cardiovascular;  Laterality: N/A;   None     POLYPECTOMY  05/08/2015   Procedure: POLYPECTOMY;  Surgeon: West Bali, MD;  Location: [facility];  Service: Endoscopy;;  transverse colon polyp   RIGHT/LEFT HEART CATH AND CORONARY ANGIOGRAPHY N/A 07/03/2022   Procedure: RIGHT/LEFT HEART CATH AND CORONARY ANGIOGRAPHY;  Surgeon: Tonny Bollman, MD;  Location: [facility] INVASIVE CV LAB;  Service: Cardiovascular;  Laterality: N/A;   TEE WITHOUT CARDIOVERSION N/A  05/26/2022   Procedure: TRANSESOPHAGEAL ECHOCARDIOGRAM (TEE);  Surgeon: Wendall Stade, MD;  Location: [facility] ENDOSCOPY;  Service: Cardiovascular;  Laterality: N/A;   TEE WITHOUT CARDIOVERSION N/A 07/16/2022   Procedure: TRANSESOPHAGEAL ECHOCARDIOGRAM;  Surgeon: Tonny Bollman, MD;  Location: [facility] INVASIVE CV LAB;  Service: Cardiovascular;  Laterality: N/A;     Current Outpatient Medications  Medication Sig Dispense Refill   acetaminophen (TYLENOL) 325 MG tablet Take 2 tablets (650 mg total) by mouth every 4 (four) hours as needed for fever, headache or mild pain. 20 tablet 0   apixaban (ELIQUIS) 5 MG TABS tablet Take 2.5 mg by mouth 2 (two) times daily.     atorvastatin (LIPITOR) 80 MG tablet Take 1 tablet (80 mg total) by mouth daily. 30 tablet 3   brimonidine (ALPHAGAN) 0.2 % ophthalmic solution Place 1 drop into both eyes daily.     calcitRIOL (ROCALTROL) 0.25 MCG capsule Take 0.25 mcg by mouth every Monday, Wednesday, and Friday.     colchicine  0.6 MG tablet Take 0.5 tablets (0.3 mg total) by mouth daily. 30 tablet 3   Daridorexant HCl 25 MG TABS Take 25 mg by mouth at bedtime.     DOCUSATE SODIUM PO Take 1 capsule by mouth daily.     esomeprazole (NEXIUM) 20 MG capsule Take 1 capsule (20 mg total) by mouth daily before breakfast. 30 capsule 3   furosemide (LASIX) 40 MG tablet Take 1 tablet (40 mg total) by mouth daily as needed for edema or fluid. (Patient taking differently: Take 40 mg by mouth 2 (two) times daily.)     hydrALAZINE (APRESOLINE) 25 MG tablet Take 1 tablet (25 mg total) by mouth 3 (three) times daily. 90 tablet 5   iron polysaccharides (NIFEREX) 150 MG capsule Take 1 capsule (150 mg total) by mouth daily.     isosorbide mononitrate (IMDUR) 30 MG 24 hr tablet Take 30 mg by mouth daily.     latanoprost (XALATAN) 0.005 % ophthalmic solution Place 1 drop into both eyes at bedtime.     melatonin 3 MG TABS tablet Take 3 mg by mouth at bedtime.     nitroGLYCERIN (NITROSTAT) 0.4 MG SL  tablet Dissolve 1 tablet under the tongue every 5 minutes as needed for chest pain. Max of 3 doses, then 911. 25 tablet 6   OLANZapine (ZYPREXA) 10 MG tablet Take 5-15 mg by mouth See admin instructions. Take 5 mg in the morning and 15 mg at night     potassium chloride SA (KLOR-CON M) 20 MEQ tablet Take 20 mEq by mouth 2 (two) times daily.     predniSONE (STERAPRED UNI-PAK 21 TAB) 10 MG (21) TBPK tablet Take 6 tablets on day 1, 5 tablets on day 2, 4 tablets on day 3, 3 tablets on day 4, 2 tablets on day 5, 1 tablet on day 6 and then stop 21 tablet 0   sertraline (ZOLOFT) 100 MG tablet Take 100 mg by mouth daily.     sodium bicarbonate 650 MG tablet Take 650 mg by mouth 2 (two) times daily.     folic acid (FOLVITE) 1 MG tablet Take 1 tablet (1 mg total) by mouth daily. (Patient not taking: Reported on 09/16/2022)     metoprolol succinate (TOPROL-XL) 100 MG 24 hr tablet Take 1 tablet (100 mg total) by mouth daily. Take with or immediately following a meal. 90 tablet 1   risperiDONE (RISPERDAL) 1 MG tablet Take 1 tablet (1 mg total) by mouth at bedtime. (Patient not taking: Reported on 09/16/2022) 30 tablet 0   No current facility-administered medications for this visit.    Allergies:   Nifedipine, Aciphex [rabeprazole sodium], Benazepril, Haloperidol, and Prilosec [omeprazole]    Social History:  The patient  reports that he quit smoking about 26 years ago. His smoking use included cigarettes. He started smoking about 49 years ago. He has a 23 pack-year smoking history. He has never been exposed to tobacco smoke. He has never used smokeless tobacco. He reports that he does not currently use drugs after having used the following drugs: Cocaine. He reports that he does not drink alcohol.   Family History:  The patient's family history includes Crohn's disease in his sister.    ROS:  Please see the history of present illness.   Otherwise, review of systems are positive for none.   All other systems  are reviewed and negative.    PHYSICAL EXAM: VS:  BP 138/88 (BP Location: Left Arm, Patient Position: Sitting, Cuff  Size: Normal)   Pulse (!) 58   Ht 5\' 8"  (1.727 m)   Wt 184 lb (83.5 kg)   SpO2 96%   BMI 27.98 kg/m  , BMI Body mass index is 27.98 kg/m. GEN: Well nourished, well developed, in no acute distress  HEENT: normal  Neck: no JVD, carotid bruits, or masses Cardiac: RRR; no murmurs, rubs, or gallops,no edema  Respiratory:  clear to auscultation bilaterally, normal work of breathing GI: soft, nontender, nondistended, + BS MS: no deformity or atrophy  Skin: warm and dry, no rash Neuro:  Strength and sensation are intact Psych: euthymic mood, full affect Vascular: Femoral pulses barely palpable on the right and +1 on the left.  Distal pulses are diminished.   EKG:  EKG is ordered today. The ekg ordered today demonstrates: Sinus bradycardia Left ventricular hypertrophy with repolarization abnormality ( Sokolow-Lyon ) When compared with ECG of 31-Aug-2022 09:34, No significant change was found    Recent Labs: 05/06/2022: TSH 1.413 07/14/2022: NT-Pro BNP 11,718 07/29/2022: B Natriuretic Peptide 505.1 09/08/2022: ALT 29; BUN 56; Creatinine, Ser 3.72; Hemoglobin 9.9; Magnesium 2.0; Platelets 80; Potassium 3.4; Sodium 140    Lipid Panel    Component Value Date/Time   CHOL 109 07/04/2022 1007   CHOL 191 08/02/2019 1515   TRIG 62 07/04/2022 1007   HDL 42 07/04/2022 1007   HDL 37 (L) 08/02/2019 1515   CHOLHDL 2.6 07/04/2022 1007   VLDL 12 07/04/2022 1007   LDLCALC 55 07/04/2022 1007   LDLCALC 123 (H) 08/02/2019 1515   LDLDIRECT 125 (H) 08/02/2019 1515      Wt Readings from Last 3 Encounters:  09/16/22 184 lb (83.5 kg)  09/08/22 187 lb 9.8 oz (85.1 kg)  07/29/22 193 lb 6.4 oz (87.7 kg)       ASSESSMENT AND PLAN:  1.  Coronary artery disease involving native coronary arteries without angina: He continues to be clinically stable with no new symptoms.  Continue  medical therapy.  Both right coronary artery and left circumflex are occluded with collaterals.  No obstructive disease affecting the LAD.  Given that he is on Eliquis, I discontinued aspirin today.  2. Chronic systolic heart failure: Most recent ejection fraction was 40 to 45%.  I asked him to decrease Toprol back to 100 mg once daily.  Given recent acute on chronic kidney disease, I recommend avoiding nephrotoxic medications.  Plans to resume spironolactone and should likely avoid an SGLT2 inhibitor.  Continue Imdur and I elected to resume hydralazine 25 mg 3 times daily.    3. Essential hypertension: Blood pressure is reasonably controlled on current medications.  4. History of right leg DVT: Currently on anticoagulation with Eliquis.  5. Hyperlipidemia: Continue atorvastatin.  Most recent lipid profile showed an LDL of 55.  6. Peripheral arterial disease: The patient likely has severe aortoiliac disease but he reports stable mild bilateral calf claudication which is currently not lifestyle limiting.  Chelle therapy for now.  7.  Mitral regurgitation: This was mild on transesophageal echocardiogram.   Disposition:   FU with me in 6 months  Signed,  [name], MD  09/16/2022 5:12 PM    [facility]

## 2022-09-16 NOTE — Patient Instructions (Signed)
Medication Instructions:  STOP the Aspirin  DECREASE the Metoprolol Succinate 100 mg once daily  START Hydralazine 25 mg three times daily  *If you need a refill on your cardiac medications before your next appointment, please call your pharmacy*   Lab Work: None ordered If you have labs (blood work) drawn today and your tests are completely normal, you will receive your results only by: MyChart Message (if you have MyChart) OR A paper copy in the mail If you have any lab test that is abnormal or we need to change your treatment, we will call you to review the results.   Testing/Procedures: None ordered   Follow-Up: At [facility], you and your health needs are our priority.  As part of our continuing mission to provide you with exceptional heart care, we have created designated Provider Care Teams.  These Care Teams include your primary Cardiologist (physician) and Advanced Practice Providers (APPs -  Physician Assistants and Nurse Practitioners) who all work together to provide you with the care you need, when you need it.  We recommend signing up for the patient portal called "MyChart".  Sign up information is provided on this After Visit Summary.  MyChart is used to connect with patients for Virtual Visits (Telemedicine).  Patients are able to view lab/test results, encounter notes, upcoming appointments, etc.  Non-urgent messages can be sent to your provider as well.   To learn more about what you can do with MyChart, go to [email or web address].    Your next appointment:   6 month(s)  Provider:   [name], MD

## 2022-09-17 DIAGNOSIS — N184 Chronic kidney disease, stage 4 (severe): Secondary | ICD-10-CM | POA: Diagnosis not present

## 2022-09-17 DIAGNOSIS — I129 Hypertensive chronic kidney disease with stage 1 through stage 4 chronic kidney disease, or unspecified chronic kidney disease: Secondary | ICD-10-CM | POA: Diagnosis not present

## 2022-09-17 DIAGNOSIS — I340 Nonrheumatic mitral (valve) insufficiency: Secondary | ICD-10-CM | POA: Diagnosis not present

## 2022-09-17 DIAGNOSIS — N179 Acute kidney failure, unspecified: Secondary | ICD-10-CM | POA: Diagnosis not present

## 2022-09-17 DIAGNOSIS — E1122 Type 2 diabetes mellitus with diabetic chronic kidney disease: Secondary | ICD-10-CM | POA: Diagnosis not present

## 2022-09-17 DIAGNOSIS — I255 Ischemic cardiomyopathy: Secondary | ICD-10-CM | POA: Diagnosis not present

## 2022-09-22 DIAGNOSIS — I255 Ischemic cardiomyopathy: Secondary | ICD-10-CM | POA: Diagnosis not present

## 2022-09-22 DIAGNOSIS — I340 Nonrheumatic mitral (valve) insufficiency: Secondary | ICD-10-CM | POA: Diagnosis not present

## 2022-09-22 DIAGNOSIS — N184 Chronic kidney disease, stage 4 (severe): Secondary | ICD-10-CM | POA: Diagnosis not present

## 2022-09-22 DIAGNOSIS — I129 Hypertensive chronic kidney disease with stage 1 through stage 4 chronic kidney disease, or unspecified chronic kidney disease: Secondary | ICD-10-CM | POA: Diagnosis not present

## 2022-09-22 DIAGNOSIS — E1122 Type 2 diabetes mellitus with diabetic chronic kidney disease: Secondary | ICD-10-CM | POA: Diagnosis not present

## 2022-09-22 DIAGNOSIS — N179 Acute kidney failure, unspecified: Secondary | ICD-10-CM | POA: Diagnosis not present

## 2022-09-23 DIAGNOSIS — N2581 Secondary hyperparathyroidism of renal origin: Secondary | ICD-10-CM | POA: Diagnosis not present

## 2022-09-23 DIAGNOSIS — N189 Chronic kidney disease, unspecified: Secondary | ICD-10-CM | POA: Diagnosis not present

## 2022-09-23 DIAGNOSIS — E876 Hypokalemia: Secondary | ICD-10-CM | POA: Diagnosis not present

## 2022-09-23 DIAGNOSIS — N184 Chronic kidney disease, stage 4 (severe): Secondary | ICD-10-CM | POA: Diagnosis not present

## 2022-09-23 DIAGNOSIS — E559 Vitamin D deficiency, unspecified: Secondary | ICD-10-CM | POA: Diagnosis not present

## 2022-09-23 DIAGNOSIS — E611 Iron deficiency: Secondary | ICD-10-CM | POA: Diagnosis not present

## 2022-09-23 DIAGNOSIS — I5042 Chronic combined systolic (congestive) and diastolic (congestive) heart failure: Secondary | ICD-10-CM | POA: Diagnosis not present

## 2022-09-23 DIAGNOSIS — I129 Hypertensive chronic kidney disease with stage 1 through stage 4 chronic kidney disease, or unspecified chronic kidney disease: Secondary | ICD-10-CM | POA: Diagnosis not present

## 2022-09-24 DIAGNOSIS — I129 Hypertensive chronic kidney disease with stage 1 through stage 4 chronic kidney disease, or unspecified chronic kidney disease: Secondary | ICD-10-CM | POA: Diagnosis not present

## 2022-09-24 DIAGNOSIS — N179 Acute kidney failure, unspecified: Secondary | ICD-10-CM | POA: Diagnosis not present

## 2022-09-24 DIAGNOSIS — I340 Nonrheumatic mitral (valve) insufficiency: Secondary | ICD-10-CM | POA: Diagnosis not present

## 2022-09-24 DIAGNOSIS — E1122 Type 2 diabetes mellitus with diabetic chronic kidney disease: Secondary | ICD-10-CM | POA: Diagnosis not present

## 2022-09-24 DIAGNOSIS — I5022 Chronic systolic (congestive) heart failure: Secondary | ICD-10-CM | POA: Diagnosis not present

## 2022-09-24 DIAGNOSIS — I10 Essential (primary) hypertension: Secondary | ICD-10-CM | POA: Diagnosis not present

## 2022-09-24 DIAGNOSIS — Z299 Encounter for prophylactic measures, unspecified: Secondary | ICD-10-CM | POA: Diagnosis not present

## 2022-09-24 DIAGNOSIS — N184 Chronic kidney disease, stage 4 (severe): Secondary | ICD-10-CM | POA: Diagnosis not present

## 2022-09-24 DIAGNOSIS — I255 Ischemic cardiomyopathy: Secondary | ICD-10-CM | POA: Diagnosis not present

## 2022-09-29 DIAGNOSIS — N184 Chronic kidney disease, stage 4 (severe): Secondary | ICD-10-CM | POA: Diagnosis not present

## 2022-09-29 DIAGNOSIS — I5042 Chronic combined systolic (congestive) and diastolic (congestive) heart failure: Secondary | ICD-10-CM | POA: Diagnosis not present

## 2022-09-29 DIAGNOSIS — R809 Proteinuria, unspecified: Secondary | ICD-10-CM | POA: Diagnosis not present

## 2022-09-29 DIAGNOSIS — E8722 Chronic metabolic acidosis: Secondary | ICD-10-CM | POA: Diagnosis not present

## 2022-09-30 DIAGNOSIS — I255 Ischemic cardiomyopathy: Secondary | ICD-10-CM | POA: Diagnosis not present

## 2022-09-30 DIAGNOSIS — N179 Acute kidney failure, unspecified: Secondary | ICD-10-CM | POA: Diagnosis not present

## 2022-09-30 DIAGNOSIS — I129 Hypertensive chronic kidney disease with stage 1 through stage 4 chronic kidney disease, or unspecified chronic kidney disease: Secondary | ICD-10-CM | POA: Diagnosis not present

## 2022-09-30 DIAGNOSIS — E1122 Type 2 diabetes mellitus with diabetic chronic kidney disease: Secondary | ICD-10-CM | POA: Diagnosis not present

## 2022-09-30 DIAGNOSIS — I340 Nonrheumatic mitral (valve) insufficiency: Secondary | ICD-10-CM | POA: Diagnosis not present

## 2022-09-30 DIAGNOSIS — N184 Chronic kidney disease, stage 4 (severe): Secondary | ICD-10-CM | POA: Diagnosis not present

## 2022-10-02 DIAGNOSIS — I5022 Chronic systolic (congestive) heart failure: Secondary | ICD-10-CM | POA: Diagnosis not present

## 2022-10-02 DIAGNOSIS — I739 Peripheral vascular disease, unspecified: Secondary | ICD-10-CM | POA: Diagnosis not present

## 2022-10-02 DIAGNOSIS — I10 Essential (primary) hypertension: Secondary | ICD-10-CM | POA: Diagnosis not present

## 2022-10-02 DIAGNOSIS — I429 Cardiomyopathy, unspecified: Secondary | ICD-10-CM | POA: Diagnosis not present

## 2022-10-02 DIAGNOSIS — Z299 Encounter for prophylactic measures, unspecified: Secondary | ICD-10-CM | POA: Diagnosis not present

## 2022-10-02 DIAGNOSIS — N184 Chronic kidney disease, stage 4 (severe): Secondary | ICD-10-CM | POA: Diagnosis not present

## 2022-10-06 DIAGNOSIS — L110 Acquired keratosis follicularis: Secondary | ICD-10-CM | POA: Diagnosis not present

## 2022-10-06 DIAGNOSIS — E1122 Type 2 diabetes mellitus with diabetic chronic kidney disease: Secondary | ICD-10-CM | POA: Diagnosis not present

## 2022-10-06 DIAGNOSIS — M79672 Pain in left foot: Secondary | ICD-10-CM | POA: Diagnosis not present

## 2022-10-06 DIAGNOSIS — L609 Nail disorder, unspecified: Secondary | ICD-10-CM | POA: Diagnosis not present

## 2022-10-06 DIAGNOSIS — M79674 Pain in right toe(s): Secondary | ICD-10-CM | POA: Diagnosis not present

## 2022-10-06 DIAGNOSIS — N179 Acute kidney failure, unspecified: Secondary | ICD-10-CM | POA: Diagnosis not present

## 2022-10-06 DIAGNOSIS — M79675 Pain in left toe(s): Secondary | ICD-10-CM | POA: Diagnosis not present

## 2022-10-06 DIAGNOSIS — I129 Hypertensive chronic kidney disease with stage 1 through stage 4 chronic kidney disease, or unspecified chronic kidney disease: Secondary | ICD-10-CM | POA: Diagnosis not present

## 2022-10-06 DIAGNOSIS — I739 Peripheral vascular disease, unspecified: Secondary | ICD-10-CM | POA: Diagnosis not present

## 2022-10-06 DIAGNOSIS — M79671 Pain in right foot: Secondary | ICD-10-CM | POA: Diagnosis not present

## 2022-10-06 DIAGNOSIS — N184 Chronic kidney disease, stage 4 (severe): Secondary | ICD-10-CM | POA: Diagnosis not present

## 2022-10-07 DIAGNOSIS — I340 Nonrheumatic mitral (valve) insufficiency: Secondary | ICD-10-CM | POA: Diagnosis not present

## 2022-10-07 DIAGNOSIS — E1122 Type 2 diabetes mellitus with diabetic chronic kidney disease: Secondary | ICD-10-CM | POA: Diagnosis not present

## 2022-10-07 DIAGNOSIS — I129 Hypertensive chronic kidney disease with stage 1 through stage 4 chronic kidney disease, or unspecified chronic kidney disease: Secondary | ICD-10-CM | POA: Diagnosis not present

## 2022-10-07 DIAGNOSIS — N179 Acute kidney failure, unspecified: Secondary | ICD-10-CM | POA: Diagnosis not present

## 2022-10-07 DIAGNOSIS — I255 Ischemic cardiomyopathy: Secondary | ICD-10-CM | POA: Diagnosis not present

## 2022-10-07 DIAGNOSIS — N184 Chronic kidney disease, stage 4 (severe): Secondary | ICD-10-CM | POA: Diagnosis not present

## 2022-10-08 ENCOUNTER — Encounter: Payer: Self-pay | Admitting: *Deleted

## 2022-10-08 ENCOUNTER — Ambulatory Visit: Payer: Self-pay | Admitting: *Deleted

## 2022-10-08 DIAGNOSIS — I255 Ischemic cardiomyopathy: Secondary | ICD-10-CM | POA: Diagnosis not present

## 2022-10-08 DIAGNOSIS — N184 Chronic kidney disease, stage 4 (severe): Secondary | ICD-10-CM | POA: Diagnosis not present

## 2022-10-08 DIAGNOSIS — I340 Nonrheumatic mitral (valve) insufficiency: Secondary | ICD-10-CM | POA: Diagnosis not present

## 2022-10-08 DIAGNOSIS — N179 Acute kidney failure, unspecified: Secondary | ICD-10-CM | POA: Diagnosis not present

## 2022-10-08 DIAGNOSIS — E1122 Type 2 diabetes mellitus with diabetic chronic kidney disease: Secondary | ICD-10-CM | POA: Diagnosis not present

## 2022-10-08 DIAGNOSIS — I129 Hypertensive chronic kidney disease with stage 1 through stage 4 chronic kidney disease, or unspecified chronic kidney disease: Secondary | ICD-10-CM | POA: Diagnosis not present

## 2022-10-08 NOTE — Patient Outreach (Signed)
  Care Coordination   Follow Up Visit Note   10/08/2022 Name: Glenn Hawkins MRN: [id number] DOB: 1955/04/08  Glenn Hawkins is a 67 y.o. year old male who sees Vyas, Dhruv B, MD for primary care. I spoke with  Glenn Hawkins by phone today.  What matters to the patients health and wellness today?  Managing blood pressure, improving balance    Goals Addressed             This Visit's Progress    Increasing Independence s/p Hosp Discharge       Care Coordination Goals: Patient will take medication as prescribed Receives meds through [place] at no cost Sister is managing his medications. He was managing them independently prior to hospital admission Talk with PCP about outpatient physical therapy Has had hosp follow-up visit. Did not discuss physical therapy.  RNCC to send another secure fax via [facility] to PCP requesting PT services RNCC to call PCP office to see if physical therapy can be ordered Move carefully and change positions slowly to avoid falls Use cane for ambulation as needed [facility] Call RN Care Coordinator [phone] with any resource or care coordination needs     Manage CHF and Monitor for Edema   On track    Care Coordination Goals: Patient will take medications as directed Patient will keep all medical appointments Patient will notify provider of any new or worsening symptoms Patient will check and record blood pressure daily and reach out to provider with any readings outside of recommended range Patient will check and record weight each morning after urinating and will call cardiologist with weight gain of >2 lbs overnight of >5 lbs in one week Patient will follow a low sodium heart healthy diet Patient will monitor for any swelling in lower extremities, hands, & abdomen Patient will call RN Care Coordinator [phone] with any resource or care coordination needs         SDOH assessments and interventions completed:  Yes  SDOH  Interventions Today    Flowsheet Row Most Recent Value  SDOH Interventions   Transportation Interventions Patient Resources (Friends/Family)        Care Coordination Interventions:  Yes, provided   Follow up plan: Follow up call scheduled for 11/13/22    Encounter Outcome:  Pt. Visit Completed   Demetrios Loll, BSN, RN-BC RN Care Coordinator [facility]  [facility] Direct Dial: [phone] Main #: [phone]

## 2022-10-09 ENCOUNTER — Encounter: Payer: Self-pay | Admitting: *Deleted

## 2022-10-09 ENCOUNTER — Encounter: Payer: Medicare Other | Admitting: *Deleted

## 2022-10-09 NOTE — Patient Outreach (Signed)
Erroneous encounter. Please disregard.

## 2022-10-11 DIAGNOSIS — I129 Hypertensive chronic kidney disease with stage 1 through stage 4 chronic kidney disease, or unspecified chronic kidney disease: Secondary | ICD-10-CM | POA: Diagnosis not present

## 2022-10-11 DIAGNOSIS — M6281 Muscle weakness (generalized): Secondary | ICD-10-CM | POA: Diagnosis not present

## 2022-10-11 DIAGNOSIS — N179 Acute kidney failure, unspecified: Secondary | ICD-10-CM | POA: Diagnosis not present

## 2022-10-11 DIAGNOSIS — N184 Chronic kidney disease, stage 4 (severe): Secondary | ICD-10-CM | POA: Diagnosis not present

## 2022-10-11 DIAGNOSIS — I340 Nonrheumatic mitral (valve) insufficiency: Secondary | ICD-10-CM | POA: Diagnosis not present

## 2022-10-11 DIAGNOSIS — I255 Ischemic cardiomyopathy: Secondary | ICD-10-CM | POA: Diagnosis not present

## 2022-10-11 DIAGNOSIS — Z7901 Long term (current) use of anticoagulants: Secondary | ICD-10-CM | POA: Diagnosis not present

## 2022-10-11 DIAGNOSIS — D509 Iron deficiency anemia, unspecified: Secondary | ICD-10-CM | POA: Diagnosis not present

## 2022-10-11 DIAGNOSIS — F200 Paranoid schizophrenia: Secondary | ICD-10-CM | POA: Diagnosis not present

## 2022-10-11 DIAGNOSIS — E1122 Type 2 diabetes mellitus with diabetic chronic kidney disease: Secondary | ICD-10-CM | POA: Diagnosis not present

## 2022-10-13 ENCOUNTER — Telehealth: Payer: Self-pay | Admitting: Cardiovascular Disease

## 2022-10-13 NOTE — Telephone Encounter (Signed)
Fax number: [phone]

## 2022-10-13 NOTE — Telephone Encounter (Signed)
Pt c/o medication issue:  1. Name of Medication:  isosorbide mononitrate (IMDUR) 30 MG 24 hr tablet    2. How are you currently taking this medication (dosage and times per day)? Take 30 mg by mouth daily.   3. Are you having a reaction (difficulty breathing--STAT)? No  4. What is your medication issue? Patient is calling because he needs a paper stating Dr. Kirke Corin prescribes this medication and he takes it daily for the [place]. Patient requested we fax the paper to the [facility], [address], [place], [place] [address].

## 2022-10-14 ENCOUNTER — Telehealth: Payer: Self-pay | Admitting: Cardiovascular Disease

## 2022-10-14 DIAGNOSIS — I255 Ischemic cardiomyopathy: Secondary | ICD-10-CM | POA: Diagnosis not present

## 2022-10-14 DIAGNOSIS — N184 Chronic kidney disease, stage 4 (severe): Secondary | ICD-10-CM | POA: Diagnosis not present

## 2022-10-14 DIAGNOSIS — E1122 Type 2 diabetes mellitus with diabetic chronic kidney disease: Secondary | ICD-10-CM | POA: Diagnosis not present

## 2022-10-14 DIAGNOSIS — I340 Nonrheumatic mitral (valve) insufficiency: Secondary | ICD-10-CM | POA: Diagnosis not present

## 2022-10-14 DIAGNOSIS — I129 Hypertensive chronic kidney disease with stage 1 through stage 4 chronic kidney disease, or unspecified chronic kidney disease: Secondary | ICD-10-CM | POA: Diagnosis not present

## 2022-10-14 DIAGNOSIS — N179 Acute kidney failure, unspecified: Secondary | ICD-10-CM | POA: Diagnosis not present

## 2022-10-14 NOTE — Telephone Encounter (Signed)
New Message:      Carleta, the nurse from the [place] needs to talk to a nurse about patient's medicine. She is with he patient at this time.

## 2022-10-14 NOTE — Telephone Encounter (Signed)
Spoke with the RN case manager and did a medication reconciliation for the patient according to last office visit 09/16/22

## 2022-10-16 DIAGNOSIS — I255 Ischemic cardiomyopathy: Secondary | ICD-10-CM | POA: Diagnosis not present

## 2022-10-16 DIAGNOSIS — I340 Nonrheumatic mitral (valve) insufficiency: Secondary | ICD-10-CM | POA: Diagnosis not present

## 2022-10-16 DIAGNOSIS — I129 Hypertensive chronic kidney disease with stage 1 through stage 4 chronic kidney disease, or unspecified chronic kidney disease: Secondary | ICD-10-CM | POA: Diagnosis not present

## 2022-10-16 DIAGNOSIS — N179 Acute kidney failure, unspecified: Secondary | ICD-10-CM | POA: Diagnosis not present

## 2022-10-16 DIAGNOSIS — N184 Chronic kidney disease, stage 4 (severe): Secondary | ICD-10-CM | POA: Diagnosis not present

## 2022-10-16 DIAGNOSIS — E1122 Type 2 diabetes mellitus with diabetic chronic kidney disease: Secondary | ICD-10-CM | POA: Diagnosis not present

## 2022-10-20 ENCOUNTER — Ambulatory Visit: Payer: Self-pay | Admitting: *Deleted

## 2022-10-20 ENCOUNTER — Encounter: Payer: Self-pay | Admitting: *Deleted

## 2022-10-20 NOTE — Patient Outreach (Signed)
Care Coordination   Follow Up Visit Note   10/20/2022 Name: JAQUORI HUNSINGER MRN: [id number] DOB: 11-30-1955  VERNARD RYMER is a 67 y.o. year old male who sees Vyas, Dhruv B, MD for primary care. I spoke with  [name] by phone today. Patient has started home health physical and occupational services and has nursing services since my last telephone appointment with him and outreach to PCP requesting these services. Patient had an ED visit on 10/14/12. He left AMA but followed up with [facility] on 10/17/22.  What matters to the patients health and wellness today?  Continuing to work with HHPT and HHOT to improve strength and balance.    Goals Addressed             This Visit's Progress    Increasing Independence s/p Hosp Discharge   On track    Care Coordination Goals: Continue to work with Home Health Physical Therapy and Occupational Therapy twice a week for balance and strength training Move carefully and change positions slowly to avoid falls Use cane for ambulation as needed Follow-up with [facility] providers as planned Call RN Care Coordinator [phone] with any resource or care coordination needs     Manage CHF and Monitor for Edema   Not on track    Care Coordination Goals: Patient will notify provider of any new or worsening symptoms Patient will check and record blood pressure daily and reach out to provider with any readings outside of recommended range Patient will check and record weight each morning after urinating and will call cardiologist with weight gain of >2 lbs overnight of >5 lbs in one week Patient will have daily weights and daily blood pressure readings available to discuss with RN at next scheduled telephone appointment Patient will monitor for any swelling in lower extremities, hands, & abdomen Patient will call RN Care Coordinator [phone] with any resource or care coordination needs         SDOH assessments and interventions  completed:  Yes SDOH Interventions Today    Flowsheet Row Most Recent Value  SDOH Interventions   Transportation Interventions Patient Resources (Friends/Family)  Physical Activity Interventions Other (Comments)  [working with Home Health physical and occupational therapy]       Care Coordination Interventions:  Yes, provided  Interventions Today    Flowsheet Row Most Recent Value  Chronic Disease   Chronic disease during today's visit Hypertension (HTN), Congestive Heart Failure (CHF), Other  [Problems with balance]  General Interventions   General Interventions Discussed/Reviewed General Interventions Discussed, General Interventions Reviewed, Durable Medical Equipment (DME), Doctor Visits  Doctor Visits Discussed/Reviewed Doctor Visits Discussed, Doctor Visits Reviewed, PCP, Specialist  [reviewed and discussed ED visit on 10/15/22 where patient went in for potential fluid volume overload and left AMA. Reviewed and discussed visit at [facility] on 10/17/22 for heart failure and edema management.]  Durable Medical Equipment (DME) BP Cuff, Other  [cane. Scales. Did not have blood pressure or weights available. Reports checking daily weights and says they are stable but did not check this morning.]  PCP/Specialist Visits Compliance with follow-up visit  [[facility] [place] appointments on 10/28/22 and 10/29/22]  Exercise Interventions   Exercise Discussed/Reviewed Physical Activity, Exercise Discussed, Exercise Reviewed  Physical Activity Discussed/Reviewed Physical Activity Discussed, Physical Activity Reviewed  [Working with HHPT and HHOT twice per week]  Education Interventions   Education Provided Provided Education  Provided Verbal Education On Nutrition, When to see the doctor, Medication  [  blood pressure monitoring and importance of daily weights. Reach out to provider with any new or worsening symptoms.]  Mental Health Interventions   Mental Health  Discussed/Reviewed Mental Health Discussed, Mental Health Reviewed  Nutrition Interventions   Nutrition Discussed/Reviewed Nutrition Discussed, Nutrition Reviewed, Fluid intake, Decreasing salt  [Follow a low sodium/DASH diet with less than 2300 mg of sodium per day]  Pharmacy Interventions   Pharmacy Dicussed/Reviewed Pharmacy Topics Discussed, Pharmacy Topics Reviewed, Medications and their functions  [patient is taking medications regularly without any concerns]  Safety Interventions   Safety Discussed/Reviewed Safety Discussed, Safety Reviewed, Fall Risk, Home Safety  [continue to work with home health physical and occupational therapy]  Home Safety Assistive Devices       Follow up plan: Follow up call scheduled for 11/14/22. Rescheduled from 11/13/22 because patient had a personal conflict.    Encounter Outcome:  Pt. Visit Completed   Demetrios Loll, BSN, RN-BC RN Care Coordinator [facility]  [facility] Direct Dial: [phone] Main #: [phone]

## 2022-10-21 DIAGNOSIS — I129 Hypertensive chronic kidney disease with stage 1 through stage 4 chronic kidney disease, or unspecified chronic kidney disease: Secondary | ICD-10-CM | POA: Diagnosis not present

## 2022-10-21 DIAGNOSIS — N179 Acute kidney failure, unspecified: Secondary | ICD-10-CM | POA: Diagnosis not present

## 2022-10-21 DIAGNOSIS — E1122 Type 2 diabetes mellitus with diabetic chronic kidney disease: Secondary | ICD-10-CM | POA: Diagnosis not present

## 2022-10-21 DIAGNOSIS — I255 Ischemic cardiomyopathy: Secondary | ICD-10-CM | POA: Diagnosis not present

## 2022-10-21 DIAGNOSIS — I340 Nonrheumatic mitral (valve) insufficiency: Secondary | ICD-10-CM | POA: Diagnosis not present

## 2022-10-21 DIAGNOSIS — N184 Chronic kidney disease, stage 4 (severe): Secondary | ICD-10-CM | POA: Diagnosis not present

## 2022-10-22 DIAGNOSIS — R638 Other symptoms and signs concerning food and fluid intake: Secondary | ICD-10-CM | POA: Diagnosis not present

## 2022-10-22 DIAGNOSIS — I509 Heart failure, unspecified: Secondary | ICD-10-CM | POA: Diagnosis not present

## 2022-10-22 DIAGNOSIS — R0789 Other chest pain: Secondary | ICD-10-CM | POA: Diagnosis not present

## 2022-10-22 DIAGNOSIS — F200 Paranoid schizophrenia: Secondary | ICD-10-CM | POA: Diagnosis not present

## 2022-10-22 DIAGNOSIS — I110 Hypertensive heart disease with heart failure: Secondary | ICD-10-CM | POA: Diagnosis not present

## 2022-10-23 ENCOUNTER — Telehealth: Payer: Self-pay | Admitting: Cardiovascular Disease

## 2022-10-23 NOTE — Telephone Encounter (Signed)
Pt c/o medication issue:  1. Name of Medication:   furosemide (LASIX) 40 MG tablet    apixaban (ELIQUIS) 5 MG TABS tablet    2. How are you currently taking this medication (dosage and times per day)?       3. Are you having a reaction (difficulty breathing--STAT)? No  4. What is your medication issue? Pt's nurse needing clarification on the above medication because the hospital discharge is different from the bottles. Please advise

## 2022-10-23 NOTE — Telephone Encounter (Signed)
Returned call to pt. LVM to return call to the office or he can

## 2022-10-24 NOTE — Telephone Encounter (Signed)
Left voicemail to return call to office.

## 2022-10-24 NOTE — Telephone Encounter (Signed)
Attempt 2 to reach pt in regards to his needing clarification. LVM to call the office or can can send a message via MyChart as well.

## 2022-11-03 DIAGNOSIS — I10 Essential (primary) hypertension: Secondary | ICD-10-CM | POA: Diagnosis not present

## 2022-11-03 DIAGNOSIS — Z299 Encounter for prophylactic measures, unspecified: Secondary | ICD-10-CM | POA: Diagnosis not present

## 2022-11-03 DIAGNOSIS — I5022 Chronic systolic (congestive) heart failure: Secondary | ICD-10-CM | POA: Diagnosis not present

## 2022-11-03 DIAGNOSIS — D696 Thrombocytopenia, unspecified: Secondary | ICD-10-CM | POA: Diagnosis not present

## 2022-11-03 DIAGNOSIS — I340 Nonrheumatic mitral (valve) insufficiency: Secondary | ICD-10-CM | POA: Diagnosis not present

## 2022-11-03 DIAGNOSIS — N179 Acute kidney failure, unspecified: Secondary | ICD-10-CM | POA: Diagnosis not present

## 2022-11-03 DIAGNOSIS — E1122 Type 2 diabetes mellitus with diabetic chronic kidney disease: Secondary | ICD-10-CM | POA: Diagnosis not present

## 2022-11-03 DIAGNOSIS — I129 Hypertensive chronic kidney disease with stage 1 through stage 4 chronic kidney disease, or unspecified chronic kidney disease: Secondary | ICD-10-CM | POA: Diagnosis not present

## 2022-11-03 DIAGNOSIS — N184 Chronic kidney disease, stage 4 (severe): Secondary | ICD-10-CM | POA: Diagnosis not present

## 2022-11-03 DIAGNOSIS — I255 Ischemic cardiomyopathy: Secondary | ICD-10-CM | POA: Diagnosis not present

## 2022-11-04 DIAGNOSIS — N179 Acute kidney failure, unspecified: Secondary | ICD-10-CM | POA: Diagnosis not present

## 2022-11-04 DIAGNOSIS — I129 Hypertensive chronic kidney disease with stage 1 through stage 4 chronic kidney disease, or unspecified chronic kidney disease: Secondary | ICD-10-CM | POA: Diagnosis not present

## 2022-11-04 DIAGNOSIS — I255 Ischemic cardiomyopathy: Secondary | ICD-10-CM | POA: Diagnosis not present

## 2022-11-04 DIAGNOSIS — E1122 Type 2 diabetes mellitus with diabetic chronic kidney disease: Secondary | ICD-10-CM | POA: Diagnosis not present

## 2022-11-04 DIAGNOSIS — I340 Nonrheumatic mitral (valve) insufficiency: Secondary | ICD-10-CM | POA: Diagnosis not present

## 2022-11-04 DIAGNOSIS — N184 Chronic kidney disease, stage 4 (severe): Secondary | ICD-10-CM | POA: Diagnosis not present

## 2022-11-11 ENCOUNTER — Encounter (HOSPITAL_COMMUNITY): Payer: Medicare Other | Admitting: Cardiology

## 2022-11-12 ENCOUNTER — Other Ambulatory Visit: Payer: Self-pay

## 2022-11-13 ENCOUNTER — Encounter: Payer: Medicare Other | Admitting: *Deleted

## 2022-11-14 ENCOUNTER — Ambulatory Visit: Payer: Self-pay | Admitting: *Deleted

## 2022-11-14 ENCOUNTER — Encounter: Payer: Self-pay | Admitting: *Deleted

## 2022-11-14 NOTE — Patient Outreach (Signed)
Care Coordination   Follow Up Visit Note   11/14/2022 Name: Glenn Hawkins MRN: [id number] DOB: 16-Nov-1955  Glenn Hawkins is a 67 y.o. year old male who sees Vyas, Dhruv B, MD for primary care. I spoke with  Glenn Hawkins by phone today.  What matters to the patients health and wellness today?  Managing heart failure and edema    Goals Addressed             This Visit's Progress    Increasing Independence s/p Hosp Discharge   On track    Care Coordination Goals: Continue to work with Home Health Physical Therapy and Occupational Therapy twice a week for balance and strength training Move carefully and change positions slowly to avoid falls Use cane for ambulation as needed Follow-up with [facility] providers as planned Call RN Care Coordinator [phone] with any resource or care coordination needs     Manage CHF and Monitor for Edema   Not on track    Care Coordination Goals: Patient will notify provider of any new or worsening symptoms Patient will keep appointment with PCP on 11/17/22 Patient will follow-up with cardiologist in January as planned or sooner if needed Patient will check and record blood pressure daily and reach out to provider with any readings outside of recommended range Patient will check and record weight each morning after urinating and will call cardiologist with weight gain of >2 lbs overnight of >5 lbs in one week Patient will have daily weights and daily blood pressure readings available to discuss with RN at next scheduled telephone appointment Patient will monitor for any swelling in lower extremities, hands, & abdomen Patient will call RN Care Coordinator [phone] with any resource or care coordination needs         SDOH assessments and interventions completed:  Yes  SDOH Interventions Today    Flowsheet Row Most Recent Value  SDOH Interventions   Transportation Interventions Patient Resources (Friends/Family)  Financial  Strain Interventions Intervention Not Indicated        Care Coordination Interventions:  Yes, provided  Interventions Today    Flowsheet Row Most Recent Value  Chronic Disease   Chronic disease during today's visit Congestive Heart Failure (CHF)  General Interventions   General Interventions Discussed/Reviewed General Interventions Reviewed, General Interventions Discussed, Doctor Visits, Labs, Durable Medical Equipment (DME)  Labs Kidney Function  Doctor Visits Discussed/Reviewed Doctor Visits Discussed, Doctor Visits Reviewed, PCP, Specialist  [reviewed recent ED visit on 10/23/22 at [facility] for edema and dietary indiscretion and [facility]  Durable Medical Equipment (DME) BP Cuff, Other  [Sclaes. Patient reports checking weight and blood pressure periodically but not daily. he does not have any readings available to discuss.]  PCP/Specialist Visits Compliance with follow-up visit  [scheduled for hospital f/u with Dr Sherril Croon on 11/17/22]  Exercise Interventions   Exercise Discussed/Reviewed Physical Activity  Physical Activity Discussed/Reviewed Physical Activity Discussed, Physical Activity Reviewed  [ambulatory with cane for assistance. Working with HHPT.]  Education Interventions   Education Provided Provided Education  Provided Engineer, petroleum On Nutrition, Labs, When to see the doctor, Medication, Exercise, Other  [importance of daily weights each morning after urinating, instructions reviewed. Importance of daily blood pressure checks.]  Labs Reviewed Kidney Function  [10/23/22 BNP 9,228.0  pg/ml, eGFR 26, Creatinine 2.59, potassium 3.2]  Nutrition Interventions   Nutrition Discussed/Reviewed Nutrition Discussed, Nutrition Reviewed, Fluid intake, Decreasing salt  [advised of need to follow a low sodium diet less  than 2 grams per day and fluid restriciton of less than 64 oz per day]  Pharmacy Interventions   Pharmacy Dicussed/Reviewed Pharmacy Topics  Discussed, Pharmacy Topics Reviewed, Medications and their functions  [Given IV furosemide 60mg  at [facility] on 10/23/22 and prescribed 60mg  BID for 10 days. Now taking furosemide 40mg  by mouth BID.]  Safety Interventions   Safety Discussed/Reviewed Safety Discussed, Safety Reviewed  [risk of fluid volume overload and importance of managing sodium intake and monitoring daily weights and blood pressure as well as monitoring for swellign in legs, feet, and abdomen]       Follow up plan: Follow up call scheduled for 12/03/22    Encounter Outcome:  Patient Visit Completed   Demetrios Loll, RN, BSN Care Management Coordinator [facility]  [facility] Direct Dial: [phone] Main #: [phone]

## 2022-11-17 DIAGNOSIS — I5022 Chronic systolic (congestive) heart failure: Secondary | ICD-10-CM | POA: Diagnosis not present

## 2022-11-17 DIAGNOSIS — N179 Acute kidney failure, unspecified: Secondary | ICD-10-CM | POA: Diagnosis not present

## 2022-11-17 DIAGNOSIS — Z299 Encounter for prophylactic measures, unspecified: Secondary | ICD-10-CM | POA: Diagnosis not present

## 2022-11-17 DIAGNOSIS — R109 Unspecified abdominal pain: Secondary | ICD-10-CM | POA: Diagnosis not present

## 2022-11-17 DIAGNOSIS — R0602 Shortness of breath: Secondary | ICD-10-CM | POA: Diagnosis not present

## 2022-11-17 DIAGNOSIS — I509 Heart failure, unspecified: Secondary | ICD-10-CM | POA: Diagnosis not present

## 2022-11-17 DIAGNOSIS — I10 Essential (primary) hypertension: Secondary | ICD-10-CM | POA: Diagnosis not present

## 2022-11-17 DIAGNOSIS — R9431 Abnormal electrocardiogram [ECG] [EKG]: Secondary | ICD-10-CM | POA: Diagnosis not present

## 2022-11-17 DIAGNOSIS — R9389 Abnormal findings on diagnostic imaging of other specified body structures: Secondary | ICD-10-CM | POA: Diagnosis not present

## 2022-11-17 DIAGNOSIS — R531 Weakness: Secondary | ICD-10-CM | POA: Diagnosis not present

## 2022-11-17 DIAGNOSIS — I252 Old myocardial infarction: Secondary | ICD-10-CM | POA: Diagnosis not present

## 2022-11-17 DIAGNOSIS — I110 Hypertensive heart disease with heart failure: Secondary | ICD-10-CM | POA: Diagnosis not present

## 2022-11-17 DIAGNOSIS — E878 Other disorders of electrolyte and fluid balance, not elsewhere classified: Secondary | ICD-10-CM | POA: Diagnosis not present

## 2022-11-17 DIAGNOSIS — R7989 Other specified abnormal findings of blood chemistry: Secondary | ICD-10-CM | POA: Diagnosis not present

## 2022-11-17 DIAGNOSIS — Z20822 Contact with and (suspected) exposure to covid-19: Secondary | ICD-10-CM | POA: Diagnosis not present

## 2022-11-18 DIAGNOSIS — I5022 Chronic systolic (congestive) heart failure: Secondary | ICD-10-CM | POA: Diagnosis not present

## 2022-11-18 DIAGNOSIS — E872 Acidosis, unspecified: Secondary | ICD-10-CM | POA: Diagnosis not present

## 2022-11-18 DIAGNOSIS — R0989 Other specified symptoms and signs involving the circulatory and respiratory systems: Secondary | ICD-10-CM | POA: Diagnosis not present

## 2022-11-18 DIAGNOSIS — Z86718 Personal history of other venous thrombosis and embolism: Secondary | ICD-10-CM | POA: Diagnosis not present

## 2022-11-18 DIAGNOSIS — Z6834 Body mass index (BMI) 34.0-34.9, adult: Secondary | ICD-10-CM | POA: Diagnosis not present

## 2022-11-18 DIAGNOSIS — N281 Cyst of kidney, acquired: Secondary | ICD-10-CM | POA: Diagnosis not present

## 2022-11-18 DIAGNOSIS — E1122 Type 2 diabetes mellitus with diabetic chronic kidney disease: Secondary | ICD-10-CM | POA: Diagnosis not present

## 2022-11-18 DIAGNOSIS — N184 Chronic kidney disease, stage 4 (severe): Secondary | ICD-10-CM | POA: Diagnosis not present

## 2022-11-18 DIAGNOSIS — Z7901 Long term (current) use of anticoagulants: Secondary | ICD-10-CM | POA: Diagnosis not present

## 2022-11-18 DIAGNOSIS — E440 Moderate protein-calorie malnutrition: Secondary | ICD-10-CM | POA: Diagnosis not present

## 2022-11-18 DIAGNOSIS — R531 Weakness: Secondary | ICD-10-CM | POA: Diagnosis not present

## 2022-11-18 DIAGNOSIS — Z79899 Other long term (current) drug therapy: Secondary | ICD-10-CM | POA: Diagnosis not present

## 2022-11-18 DIAGNOSIS — E860 Dehydration: Secondary | ICD-10-CM | POA: Diagnosis not present

## 2022-11-18 DIAGNOSIS — I2510 Atherosclerotic heart disease of native coronary artery without angina pectoris: Secondary | ICD-10-CM | POA: Diagnosis not present

## 2022-11-18 DIAGNOSIS — E870 Hyperosmolality and hypernatremia: Secondary | ICD-10-CM | POA: Diagnosis not present

## 2022-11-18 DIAGNOSIS — E876 Hypokalemia: Secondary | ICD-10-CM | POA: Diagnosis not present

## 2022-11-18 DIAGNOSIS — N179 Acute kidney failure, unspecified: Secondary | ICD-10-CM | POA: Diagnosis not present

## 2022-11-18 DIAGNOSIS — F209 Schizophrenia, unspecified: Secondary | ICD-10-CM | POA: Diagnosis not present

## 2022-11-18 DIAGNOSIS — Z20822 Contact with and (suspected) exposure to covid-19: Secondary | ICD-10-CM | POA: Diagnosis not present

## 2022-11-18 DIAGNOSIS — D61818 Other pancytopenia: Secondary | ICD-10-CM | POA: Diagnosis not present

## 2022-11-18 DIAGNOSIS — N170 Acute kidney failure with tubular necrosis: Secondary | ICD-10-CM | POA: Diagnosis not present

## 2022-11-18 DIAGNOSIS — I130 Hypertensive heart and chronic kidney disease with heart failure and stage 1 through stage 4 chronic kidney disease, or unspecified chronic kidney disease: Secondary | ICD-10-CM | POA: Diagnosis not present

## 2022-11-18 DIAGNOSIS — M109 Gout, unspecified: Secondary | ICD-10-CM | POA: Diagnosis not present

## 2022-11-18 DIAGNOSIS — Z87891 Personal history of nicotine dependence: Secondary | ICD-10-CM | POA: Diagnosis not present

## 2022-11-18 DIAGNOSIS — K5900 Constipation, unspecified: Secondary | ICD-10-CM | POA: Diagnosis not present

## 2022-11-18 DIAGNOSIS — R140 Abdominal distension (gaseous): Secondary | ICD-10-CM | POA: Diagnosis not present

## 2022-11-25 ENCOUNTER — Other Ambulatory Visit: Payer: Self-pay

## 2022-11-25 DIAGNOSIS — N184 Chronic kidney disease, stage 4 (severe): Secondary | ICD-10-CM | POA: Diagnosis not present

## 2022-11-25 DIAGNOSIS — I5022 Chronic systolic (congestive) heart failure: Secondary | ICD-10-CM | POA: Diagnosis not present

## 2022-11-25 DIAGNOSIS — Z09 Encounter for follow-up examination after completed treatment for conditions other than malignant neoplasm: Secondary | ICD-10-CM | POA: Diagnosis not present

## 2022-11-25 DIAGNOSIS — I10 Essential (primary) hypertension: Secondary | ICD-10-CM | POA: Diagnosis not present

## 2022-11-25 DIAGNOSIS — Z299 Encounter for prophylactic measures, unspecified: Secondary | ICD-10-CM | POA: Diagnosis not present

## 2022-11-25 DIAGNOSIS — M109 Gout, unspecified: Secondary | ICD-10-CM | POA: Diagnosis not present

## 2022-11-25 MED ORDER — FUROSEMIDE 40 MG PO TABS: 40.0000 mg | ORAL_TABLET | Freq: Every day | ORAL | 2 refills | Status: DC | PRN

## 2022-12-02 DIAGNOSIS — F209 Schizophrenia, unspecified: Secondary | ICD-10-CM | POA: Diagnosis not present

## 2022-12-02 DIAGNOSIS — N184 Chronic kidney disease, stage 4 (severe): Secondary | ICD-10-CM | POA: Diagnosis not present

## 2022-12-02 DIAGNOSIS — E876 Hypokalemia: Secondary | ICD-10-CM | POA: Diagnosis not present

## 2022-12-02 DIAGNOSIS — D61818 Other pancytopenia: Secondary | ICD-10-CM | POA: Diagnosis not present

## 2022-12-02 DIAGNOSIS — N179 Acute kidney failure, unspecified: Secondary | ICD-10-CM | POA: Diagnosis not present

## 2022-12-02 DIAGNOSIS — D696 Thrombocytopenia, unspecified: Secondary | ICD-10-CM | POA: Diagnosis not present

## 2022-12-02 DIAGNOSIS — I5022 Chronic systolic (congestive) heart failure: Secondary | ICD-10-CM | POA: Diagnosis not present

## 2022-12-02 DIAGNOSIS — Z7901 Long term (current) use of anticoagulants: Secondary | ICD-10-CM | POA: Diagnosis not present

## 2022-12-02 DIAGNOSIS — E1122 Type 2 diabetes mellitus with diabetic chronic kidney disease: Secondary | ICD-10-CM | POA: Diagnosis not present

## 2022-12-02 DIAGNOSIS — D631 Anemia in chronic kidney disease: Secondary | ICD-10-CM | POA: Diagnosis not present

## 2022-12-02 DIAGNOSIS — E440 Moderate protein-calorie malnutrition: Secondary | ICD-10-CM | POA: Diagnosis not present

## 2022-12-02 DIAGNOSIS — Z86718 Personal history of other venous thrombosis and embolism: Secondary | ICD-10-CM | POA: Diagnosis not present

## 2022-12-03 ENCOUNTER — Encounter: Payer: Self-pay | Admitting: *Deleted

## 2022-12-03 ENCOUNTER — Telehealth: Payer: Self-pay | Admitting: *Deleted

## 2022-12-03 DIAGNOSIS — N189 Chronic kidney disease, unspecified: Secondary | ICD-10-CM | POA: Diagnosis not present

## 2022-12-03 DIAGNOSIS — D631 Anemia in chronic kidney disease: Secondary | ICD-10-CM | POA: Diagnosis not present

## 2022-12-03 DIAGNOSIS — R809 Proteinuria, unspecified: Secondary | ICD-10-CM | POA: Diagnosis not present

## 2022-12-03 NOTE — Progress Notes (Signed)
Care Coordination Note  12/03/2022 Name: LANCER PERCLE MRN: [id number] DOB: October 14, 1955  BASHAR OLEARY is a 67 y.o. year old male who is a primary care patient of Doreen Beam B, MD and is actively engaged with the care management team. I reached out to [name] by phone today to assist with re-scheduling a follow up visit with the RN Case Manager  Follow up plan: Unsuccessful telephone outreach attempt made. A HIPAA compliant phone message was left for the patient providing contact information and requesting a return call.   Cassandria Anger  Care Coordination Care Guide  Direct Dial: [phone]

## 2022-12-09 DIAGNOSIS — D631 Anemia in chronic kidney disease: Secondary | ICD-10-CM | POA: Diagnosis not present

## 2022-12-09 DIAGNOSIS — I5042 Chronic combined systolic (congestive) and diastolic (congestive) heart failure: Secondary | ICD-10-CM | POA: Diagnosis not present

## 2022-12-09 DIAGNOSIS — N184 Chronic kidney disease, stage 4 (severe): Secondary | ICD-10-CM | POA: Diagnosis not present

## 2022-12-09 DIAGNOSIS — Z5181 Encounter for therapeutic drug level monitoring: Secondary | ICD-10-CM | POA: Diagnosis not present

## 2022-12-10 NOTE — Progress Notes (Signed)
Care Coordination Note  12/10/2022 Name: [name] MRN: [id number] DOB: Jul 14, 1955  Glenn Hawkins is a 67 y.o. year old male who is a primary care patient of Vyas, Dhruv B, MD and is actively engaged with the care management team. I reached out to [name] by phone today to assist with re-scheduling a follow up visit with the RN Case Manager  Follow up plan: Telephone appointment with care management team member scheduled for:12/26/22  [facility] Coordination Care Guide  Direct Dial: [phone]

## 2022-12-15 DIAGNOSIS — M79674 Pain in right toe(s): Secondary | ICD-10-CM | POA: Diagnosis not present

## 2022-12-15 DIAGNOSIS — M2041 Other hammer toe(s) (acquired), right foot: Secondary | ICD-10-CM | POA: Diagnosis not present

## 2022-12-15 DIAGNOSIS — L609 Nail disorder, unspecified: Secondary | ICD-10-CM | POA: Diagnosis not present

## 2022-12-15 DIAGNOSIS — M79671 Pain in right foot: Secondary | ICD-10-CM | POA: Diagnosis not present

## 2022-12-15 DIAGNOSIS — M79675 Pain in left toe(s): Secondary | ICD-10-CM | POA: Diagnosis not present

## 2022-12-15 DIAGNOSIS — M79672 Pain in left foot: Secondary | ICD-10-CM | POA: Diagnosis not present

## 2022-12-15 DIAGNOSIS — I739 Peripheral vascular disease, unspecified: Secondary | ICD-10-CM | POA: Diagnosis not present

## 2022-12-15 DIAGNOSIS — L110 Acquired keratosis follicularis: Secondary | ICD-10-CM | POA: Diagnosis not present

## 2022-12-16 DIAGNOSIS — N184 Chronic kidney disease, stage 4 (severe): Secondary | ICD-10-CM | POA: Diagnosis not present

## 2022-12-16 DIAGNOSIS — I5022 Chronic systolic (congestive) heart failure: Secondary | ICD-10-CM | POA: Diagnosis not present

## 2022-12-16 DIAGNOSIS — N179 Acute kidney failure, unspecified: Secondary | ICD-10-CM | POA: Diagnosis not present

## 2022-12-16 DIAGNOSIS — F209 Schizophrenia, unspecified: Secondary | ICD-10-CM | POA: Diagnosis not present

## 2022-12-16 DIAGNOSIS — E1122 Type 2 diabetes mellitus with diabetic chronic kidney disease: Secondary | ICD-10-CM | POA: Diagnosis not present

## 2022-12-16 DIAGNOSIS — D631 Anemia in chronic kidney disease: Secondary | ICD-10-CM | POA: Diagnosis not present

## 2022-12-17 DIAGNOSIS — F209 Schizophrenia, unspecified: Secondary | ICD-10-CM | POA: Diagnosis not present

## 2022-12-17 DIAGNOSIS — N179 Acute kidney failure, unspecified: Secondary | ICD-10-CM | POA: Diagnosis not present

## 2022-12-17 DIAGNOSIS — N184 Chronic kidney disease, stage 4 (severe): Secondary | ICD-10-CM | POA: Diagnosis not present

## 2022-12-17 DIAGNOSIS — E1122 Type 2 diabetes mellitus with diabetic chronic kidney disease: Secondary | ICD-10-CM | POA: Diagnosis not present

## 2022-12-17 DIAGNOSIS — I5022 Chronic systolic (congestive) heart failure: Secondary | ICD-10-CM | POA: Diagnosis not present

## 2022-12-17 DIAGNOSIS — D631 Anemia in chronic kidney disease: Secondary | ICD-10-CM | POA: Diagnosis not present

## 2022-12-19 DIAGNOSIS — E1122 Type 2 diabetes mellitus with diabetic chronic kidney disease: Secondary | ICD-10-CM | POA: Diagnosis not present

## 2022-12-19 DIAGNOSIS — N184 Chronic kidney disease, stage 4 (severe): Secondary | ICD-10-CM | POA: Diagnosis not present

## 2022-12-19 DIAGNOSIS — N179 Acute kidney failure, unspecified: Secondary | ICD-10-CM | POA: Diagnosis not present

## 2022-12-19 DIAGNOSIS — D631 Anemia in chronic kidney disease: Secondary | ICD-10-CM | POA: Diagnosis not present

## 2022-12-26 ENCOUNTER — Ambulatory Visit: Payer: Self-pay | Admitting: *Deleted

## 2022-12-26 ENCOUNTER — Encounter: Payer: Self-pay | Admitting: *Deleted

## 2022-12-26 NOTE — Patient Outreach (Signed)
Care Coordination   Follow Up Visit Note   12/26/2022 Name: LARKIN MORELOS MRN: [id number] DOB: Mar 28, 1955  PAUBLO WARSHAWSKY is a 67 y.o. year old male who sees Vyas, Dhruv B, MD for primary care. I spoke with  [name] by phone today.  What matters to the patients health and wellness today?  Patient did not endorse any specific concerns.    Goals Addressed             This Visit's Progress    COMPLETED: Increasing Independence s/p Hosp Discharge       Care Coordination Goals: Continue to work with Home Health Physical Therapy and Occupational Therapy twice a week for balance and strength training Move carefully and change positions slowly to avoid falls Use cane for ambulation as needed Follow-up with [facility] providers as planned Call RN Care Coordinator [phone] with any resource or care coordination needs     Manage CHF and Monitor for Edema   Not on track    Care Coordination Goals: Patient will notify provider of any new or worsening symptoms Patient will keep appointment with PCP in December Patient will follow-up with cardiologist in January as planned or sooner if needed Patient will check and record blood pressure daily and reach out to provider with any readings outside of recommended range Patient will check and record weight each morning after urinating and will call cardiologist with weight gain of >2 lbs overnight of >5 lbs in one week Patient will have daily weights and daily blood pressure readings available to discuss with RN at next scheduled telephone appointment Patient will monitor for any swelling in lower extremities, hands, & abdomen Patient will call RN Care Coordinator [phone] with any resource or care coordination needs         SDOH assessments and interventions completed:  Yes  SDOH Interventions Today    Flowsheet Row Most Recent Value  SDOH Interventions   Housing Interventions Intervention Not Indicated   Transportation Interventions Patient Resources (Friends/Family)  Financial Strain Interventions Intervention Not Indicated  Physical Activity Interventions Patient Declined        Care Coordination Interventions:  Yes, provided  Interventions Today    Flowsheet Row Most Recent Value  Chronic Disease   Chronic disease during today's visit Congestive Heart Failure (CHF)  General Interventions   General Interventions Discussed/Reviewed General Interventions Discussed, General Interventions Reviewed, Labs, Durable Medical Equipment (DME), Doctor Visits, Vaccines  Labs Kidney Function  Vaccines COVID-19, Flu, Pneumonia  Doctor Visits Discussed/Reviewed Doctor Visits Discussed, Doctor Visits Reviewed, Annual Wellness Visits, PCP, Specialist  Durable Medical Equipment (DME) BP Cuff, Other  [Scales. Patient does not have any blood pressure readings or daily weights to review today.]  PCP/Specialist Visits Compliance with follow-up visit  [Follow-up with PCP as scheduled in December or sooner if needed and with [facility] as scheduled]  Exercise Interventions   Exercise Discussed/Reviewed Exercise Discussed, Exercise Reviewed, Physical Activity  Physical Activity Discussed/Reviewed Physical Activity Discussed, Physical Activity Reviewed  [Patient is ambulatory and uses a cane if walking long distances. Has a handicap parking placard. Does not exercise. Encouraged to increase activity level with an ultimate goal of 150 minutes per week.]  Education Interventions   Education Provided Provided Education  Provided Verbal Education On Nutrition, Medication, When to see the doctor, Exercise, Other  [Importance of blood pressure monitoring and daily weights. Weigh each morning after urinating. Call cardiologist with any weight gain over 2 lbs in one day or  over 5 lbs in one week.]  Nutrition Interventions   Nutrition Discussed/Reviewed Nutrition Discussed, Nutrition Reviewed, Decreasing salt,  Adding fruits and vegetables, Fluid intake  [Heart healthy/low sodium. Limit sodium to 2 grams per day]  Pharmacy Interventions   Pharmacy Dicussed/Reviewed Pharmacy Topics Discussed, Pharmacy Topics Reviewed, Medications and their functions  [taking medication as prescribed. No questions or concerns at this time.]  Safety Interventions   Safety Discussed/Reviewed Safety Discussed, Home Safety, Fall Risk, Safety Reviewed  Home Safety Assistive Devices  Advanced Directive Interventions   Advanced Directives Discussed/Reviewed Advanced Directives Discussed, Advanced Care Planning  [encouraged to consider]       Follow up plan: Follow up call scheduled for 02/04/23    Encounter Outcome:  Patient Visit Completed   Demetrios Loll, RN, BSN Care Management Coordinator [facility]  [facility] Direct Dial: [phone] Main #: [phone]

## 2022-12-31 DIAGNOSIS — I10 Essential (primary) hypertension: Secondary | ICD-10-CM | POA: Diagnosis not present

## 2022-12-31 DIAGNOSIS — Z299 Encounter for prophylactic measures, unspecified: Secondary | ICD-10-CM | POA: Diagnosis not present

## 2022-12-31 DIAGNOSIS — M10072 Idiopathic gout, left ankle and foot: Secondary | ICD-10-CM | POA: Diagnosis not present

## 2022-12-31 DIAGNOSIS — N185 Chronic kidney disease, stage 5: Secondary | ICD-10-CM | POA: Diagnosis not present

## 2023-01-06 ENCOUNTER — Other Ambulatory Visit: Payer: Self-pay | Admitting: Cardiovascular Disease

## 2023-01-06 NOTE — Telephone Encounter (Signed)
Refill Request.  

## 2023-01-08 ENCOUNTER — Inpatient Hospital Stay (HOSPITAL_COMMUNITY): Payer: Medicare Other

## 2023-01-08 ENCOUNTER — Encounter (HOSPITAL_COMMUNITY): Payer: Self-pay

## 2023-01-08 ENCOUNTER — Inpatient Hospital Stay (HOSPITAL_COMMUNITY)
Admission: EM | Admit: 2023-01-08 | Discharge: 2023-01-12 | DRG: 291 | Disposition: A | Discharge status: Home / Self Care | Payer: Medicare Other | Source: Other Acute Inpatient Hospital | Attending: Internal Medicine | Admitting: Internal Medicine

## 2023-01-08 ENCOUNTER — Other Ambulatory Visit: Payer: Self-pay | Admitting: Pulmonary Disease

## 2023-01-08 DIAGNOSIS — Z8379 Family history of other diseases of the digestive system: Secondary | ICD-10-CM | POA: Diagnosis not present

## 2023-01-08 DIAGNOSIS — I10 Essential (primary) hypertension: Secondary | ICD-10-CM | POA: Diagnosis not present

## 2023-01-08 DIAGNOSIS — D696 Thrombocytopenia, unspecified: Secondary | ICD-10-CM | POA: Diagnosis present

## 2023-01-08 DIAGNOSIS — J811 Chronic pulmonary edema: Secondary | ICD-10-CM | POA: Diagnosis not present

## 2023-01-08 DIAGNOSIS — Z79899 Other long term (current) drug therapy: Secondary | ICD-10-CM

## 2023-01-08 DIAGNOSIS — Z888 Allergy status to other drugs, medicaments and biological substances status: Secondary | ICD-10-CM | POA: Diagnosis not present

## 2023-01-08 DIAGNOSIS — I517 Cardiomegaly: Secondary | ICD-10-CM | POA: Diagnosis not present

## 2023-01-08 DIAGNOSIS — I252 Old myocardial infarction: Secondary | ICD-10-CM

## 2023-01-08 DIAGNOSIS — I255 Ischemic cardiomyopathy: Secondary | ICD-10-CM | POA: Diagnosis not present

## 2023-01-08 DIAGNOSIS — Z87891 Personal history of nicotine dependence: Secondary | ICD-10-CM

## 2023-01-08 DIAGNOSIS — I509 Heart failure, unspecified: Secondary | ICD-10-CM | POA: Diagnosis not present

## 2023-01-08 DIAGNOSIS — K219 Gastro-esophageal reflux disease without esophagitis: Secondary | ICD-10-CM | POA: Diagnosis present

## 2023-01-08 DIAGNOSIS — E1122 Type 2 diabetes mellitus with diabetic chronic kidney disease: Secondary | ICD-10-CM | POA: Diagnosis present

## 2023-01-08 DIAGNOSIS — I5023 Acute on chronic systolic (congestive) heart failure: Secondary | ICD-10-CM | POA: Diagnosis present

## 2023-01-08 DIAGNOSIS — I340 Nonrheumatic mitral (valve) insufficiency: Secondary | ICD-10-CM | POA: Diagnosis not present

## 2023-01-08 DIAGNOSIS — I130 Hypertensive heart and chronic kidney disease with heart failure and stage 1 through stage 4 chronic kidney disease, or unspecified chronic kidney disease: Principal | ICD-10-CM | POA: Diagnosis present

## 2023-01-08 DIAGNOSIS — Z20822 Contact with and (suspected) exposure to covid-19: Secondary | ICD-10-CM | POA: Diagnosis not present

## 2023-01-08 DIAGNOSIS — D638 Anemia in other chronic diseases classified elsewhere: Secondary | ICD-10-CM | POA: Diagnosis not present

## 2023-01-08 DIAGNOSIS — I2510 Atherosclerotic heart disease of native coronary artery without angina pectoris: Secondary | ICD-10-CM | POA: Diagnosis present

## 2023-01-08 DIAGNOSIS — E785 Hyperlipidemia, unspecified: Secondary | ICD-10-CM | POA: Diagnosis present

## 2023-01-08 DIAGNOSIS — R0989 Other specified symptoms and signs involving the circulatory and respiratory systems: Secondary | ICD-10-CM | POA: Diagnosis not present

## 2023-01-08 DIAGNOSIS — N184 Chronic kidney disease, stage 4 (severe): Secondary | ICD-10-CM | POA: Diagnosis present

## 2023-01-08 DIAGNOSIS — R0602 Shortness of breath: Secondary | ICD-10-CM | POA: Diagnosis not present

## 2023-01-08 DIAGNOSIS — Z7901 Long term (current) use of anticoagulants: Secondary | ICD-10-CM | POA: Diagnosis not present

## 2023-01-08 DIAGNOSIS — R918 Other nonspecific abnormal finding of lung field: Secondary | ICD-10-CM | POA: Diagnosis not present

## 2023-01-08 DIAGNOSIS — D631 Anemia in chronic kidney disease: Secondary | ICD-10-CM | POA: Diagnosis present

## 2023-01-08 DIAGNOSIS — J810 Acute pulmonary edema: Secondary | ICD-10-CM | POA: Diagnosis not present

## 2023-01-08 DIAGNOSIS — E1151 Type 2 diabetes mellitus with diabetic peripheral angiopathy without gangrene: Secondary | ICD-10-CM | POA: Diagnosis present

## 2023-01-08 DIAGNOSIS — R0600 Dyspnea, unspecified: Secondary | ICD-10-CM | POA: Diagnosis not present

## 2023-01-08 DIAGNOSIS — F200 Paranoid schizophrenia: Secondary | ICD-10-CM | POA: Diagnosis present

## 2023-01-08 DIAGNOSIS — Z86718 Personal history of other venous thrombosis and embolism: Secondary | ICD-10-CM

## 2023-01-08 LAB — COMPREHENSIVE METABOLIC PANEL
ALT: 14 U/L (ref 0–44)
AST: 18 U/L (ref 15–41)
Albumin: 3.2 g/dL — ABNORMAL LOW (ref 3.5–5.0)
Alkaline Phosphatase: 93 U/L (ref 38–126)
Anion gap: 12 (ref 5–15)
BUN: 31 mg/dL — ABNORMAL HIGH (ref 8–23)
CO2: 23 mmol/L (ref 22–32)
Calcium: 8.8 mg/dL — ABNORMAL LOW (ref 8.9–10.3)
Chloride: 109 mmol/L (ref 98–111)
Creatinine, Ser: 3.28 mg/dL — ABNORMAL HIGH (ref 0.61–1.24)
GFR, Estimated: 20 mL/min — ABNORMAL LOW (ref >60)
Glucose, Bld: 86 mg/dL (ref 70–99)
Potassium: 4.5 mmol/L (ref 3.5–5.1)
Sodium: 144 mmol/L (ref 135–145)
Total Bilirubin: 0.8 mg/dL (ref <1.2)
Total Protein: 5.9 g/dL — ABNORMAL LOW (ref 6.5–8.1)

## 2023-01-08 LAB — BRAIN NATRIURETIC PEPTIDE: B Natriuretic Peptide: 769.3 pg/mL — ABNORMAL HIGH (ref 0.0–100.0)

## 2023-01-08 LAB — HEMOGLOBIN A1C
Hgb A1c MFr Bld: 5.7 % — ABNORMAL HIGH (ref 4.8–5.6)
Mean Plasma Glucose: 116.89 mg/dL

## 2023-01-08 LAB — CBC
HCT: 34.3 % — ABNORMAL LOW (ref 39.0–52.0)
Hemoglobin: 10.6 g/dL — ABNORMAL LOW (ref 13.0–17.0)
MCH: 30.9 pg (ref 26.0–34.0)
MCHC: 30.9 g/dL (ref 30.0–36.0)
MCV: 100 fL (ref 80.0–100.0)
Platelets: 102 10*3/uL — ABNORMAL LOW (ref 150–400)
RBC: 3.43 MIL/uL — ABNORMAL LOW (ref 4.22–5.81)
RDW: 13.2 % (ref 11.5–15.5)
WBC: 7.7 10*3/uL (ref 4.0–10.5)
nRBC: 0 % (ref 0.0–0.2)

## 2023-01-08 LAB — GLUCOSE, CAPILLARY
Glucose-Capillary: 102 mg/dL — ABNORMAL HIGH (ref 70–99)
Glucose-Capillary: 137 mg/dL — ABNORMAL HIGH (ref 70–99)
Glucose-Capillary: 74 mg/dL (ref 70–99)
Glucose-Capillary: 85 mg/dL (ref 70–99)

## 2023-01-08 LAB — MAGNESIUM: Magnesium: 1.9 mg/dL (ref 1.7–2.4)

## 2023-01-08 LAB — MRSA NEXT GEN BY PCR, NASAL: MRSA by PCR Next Gen: NOT DETECTED

## 2023-01-08 LAB — TROPONIN I (HIGH SENSITIVITY): Troponin I (High Sensitivity): 34 ng/L — ABNORMAL HIGH (ref <18)

## 2023-01-08 MED ORDER — OLANZAPINE 5 MG PO TABS: 5.0000 mg | ORAL_TABLET | ORAL | Status: DC

## 2023-01-08 MED ORDER — INSULIN ASPART 100 UNIT/ML IJ SOLN
0.0000 [IU] | INTRAMUSCULAR | Status: DC
  Administered 2023-01-08: 1 [IU] via SUBCUTANEOUS

## 2023-01-08 MED ORDER — ATORVASTATIN CALCIUM 80 MG PO TABS
80.0000 mg | ORAL_TABLET | Freq: Every day | ORAL | Status: DC
  Administered 2023-01-08 – 2023-01-12 (×5): 80 mg via ORAL
  Filled 2023-01-08 (×5): qty 1

## 2023-01-08 MED ORDER — OLANZAPINE 5 MG PO TABS
15.0000 mg | ORAL_TABLET | Freq: Every day | ORAL | Status: DC
  Administered 2023-01-08 – 2023-01-11 (×4): 15 mg via ORAL
  Filled 2023-01-08 (×5): qty 1

## 2023-01-08 MED ORDER — NITROGLYCERIN IN D5W 200-5 MCG/ML-% IV SOLN
0.0000 ug/min | INTRAVENOUS | Status: DC
  Administered 2023-01-08: 20 ug/min via INTRAVENOUS
  Filled 2023-01-08 (×2): qty 250

## 2023-01-08 MED ORDER — ISOSORBIDE MONONITRATE ER 30 MG PO TB24
30.0000 mg | ORAL_TABLET | Freq: Every day | ORAL | Status: DC
  Administered 2023-01-08 – 2023-01-11 (×4): 30 mg via ORAL
  Filled 2023-01-08 (×4): qty 1

## 2023-01-08 MED ORDER — ACETAMINOPHEN 325 MG PO TABS
650.0000 mg | ORAL_TABLET | ORAL | Status: DC | PRN
  Administered 2023-01-11 – 2023-01-12 (×3): 650 mg via ORAL
  Filled 2023-01-08 (×3): qty 2

## 2023-01-08 MED ORDER — DOCUSATE SODIUM 100 MG PO CAPS: 100.0000 mg | ORAL_CAPSULE | Freq: Two times a day (BID) | ORAL | Status: DC | PRN

## 2023-01-08 MED ORDER — HYDRALAZINE HCL 25 MG PO TABS: 25.0000 mg | ORAL_TABLET | Freq: Three times a day (TID) | ORAL | Status: DC

## 2023-01-08 MED ORDER — ACETAMINOPHEN 500 MG PO TABS
1000.0000 mg | ORAL_TABLET | Freq: Four times a day (QID) | ORAL | Status: AC
  Administered 2023-01-08 – 2023-01-10 (×5): 1000 mg via ORAL
  Filled 2023-01-08 (×5): qty 2

## 2023-01-08 MED ORDER — FUROSEMIDE 10 MG/ML IJ SOLN
60.0000 mg | Freq: Once | INTRAMUSCULAR | Status: AC
  Administered 2023-01-08: 60 mg via INTRAVENOUS
  Filled 2023-01-08: qty 6

## 2023-01-08 MED ORDER — ORAL CARE MOUTH RINSE: 15.0000 mL | OROMUCOSAL | Status: DC | PRN

## 2023-01-08 MED ORDER — METOPROLOL SUCCINATE ER 100 MG PO TB24
100.0000 mg | ORAL_TABLET | Freq: Every day | ORAL | Status: DC
  Administered 2023-01-08 – 2023-01-11 (×4): 100 mg via ORAL
  Filled 2023-01-08 (×4): qty 1

## 2023-01-08 MED ORDER — SERTRALINE HCL 100 MG PO TABS
100.0000 mg | ORAL_TABLET | Freq: Every day | ORAL | Status: DC
  Administered 2023-01-08 – 2023-01-12 (×5): 100 mg via ORAL
  Filled 2023-01-08 (×5): qty 1

## 2023-01-08 MED ORDER — HEPARIN SODIUM (PORCINE) 5000 UNIT/ML IJ SOLN
5000.0000 [IU] | Freq: Three times a day (TID) | INTRAMUSCULAR | Status: DC
  Filled 2023-01-08: qty 1

## 2023-01-08 MED ORDER — POLYETHYLENE GLYCOL 3350 17 G PO PACK
17.0000 g | PACK | Freq: Every day | ORAL | Status: DC | PRN
  Administered 2023-01-10: 17 g via ORAL
  Filled 2023-01-08: qty 1

## 2023-01-08 MED ORDER — OLANZAPINE 5 MG PO TABS
5.0000 mg | ORAL_TABLET | Freq: Every day | ORAL | Status: DC
  Administered 2023-01-08 – 2023-01-12 (×5): 5 mg via ORAL
  Filled 2023-01-08 (×5): qty 1

## 2023-01-08 MED ORDER — PANTOPRAZOLE SODIUM 40 MG PO TBEC
40.0000 mg | DELAYED_RELEASE_TABLET | Freq: Every day | ORAL | Status: DC
  Administered 2023-01-08 – 2023-01-12 (×5): 40 mg via ORAL
  Filled 2023-01-08 (×5): qty 1

## 2023-01-08 MED ORDER — APIXABAN 2.5 MG PO TABS
2.5000 mg | ORAL_TABLET | Freq: Two times a day (BID) | ORAL | Status: DC
  Administered 2023-01-08 – 2023-01-12 (×9): 2.5 mg via ORAL
  Filled 2023-01-08 (×9): qty 1

## 2023-01-08 NOTE — Plan of Care (Signed)
  Problem: Education: Goal: Knowledge of General Education information will improve Description: Including pain rating scale, medication(s)/side effects and non-pharmacologic comfort measures Outcome: Progressing   Problem: Health Behavior/Discharge Planning: Goal: Ability to manage health-related needs will improve Outcome: Progressing   Problem: Clinical Measurements: Goal: Respiratory complications will improve Outcome: Progressing   

## 2023-01-08 NOTE — Progress Notes (Signed)
New admit from [facility]. Arrived on BIPAP. Placed on 2L [place]. No distress noted. No WOB.CCM aware

## 2023-01-08 NOTE — H&P (Signed)
NAME:  Glenn Hawkins, MRN:  [id number], DOB:  Aug 27, 1955, LOS: 0 ADMISSION DATE:  01/08/2023, CONSULTATION DATE:  01/08/2023 REFERRING MD:  Lanier Prude, CHIEF COMPLAINT:  pulmonary edema   History of Present Illness:  67 year old male with past medical history of hypertension, type 2 diabetes, hyperlipidemia, CAD, systolic CHF, ischemic cardiomyopathy, severe mitral regurgitation, IDA, schizophrenia, CKD IV who presents as transfer from [facility] emergency department for pulmonary edema. Per EDP note, presented via EMS for shortness of breath. He had lab work up including trop 41, Na 146, K 3.9, BUN 32, Cr 3.28, wbc 9.4, hgb 11.3, pro-BNP 10,800, covid/flu/rsv negative. CXR with pulmonary edema. Given 60 IV lasix, started on nitroglycerin gtt. Patient requested admission to [facility] where he receives care.   On admission to [facility], patient is awake, alert, oriented. On 3LNC. Talking in complete sentences. He states symptoms began last night. States he felt "agitated" last night before bed. Took his medications. This morning he was woken up by his shortness of breath. No chest pain. Endorses taking his medications as prescribed without missed doses. No recent changes to his medications.   Of note, recent admission 11/23/22 to [facility] for AKI. Found to have Cr of 6, fatigued. Given fluid resuscitation with improvement. Admitted 07/16/22 for severe mitral regurgitation. Admitted 05/06/22-05/09/22 for CHF exacerbation. Given IV lasix and discharged with cardiology follow-up.   Pertinent  Medical History  hypertension, type 2 diabetes, hyperlipidemia, CAD, systolic CHF, ischemic cardiomyopathy, severe mitral regurgitation, IDA, schizophrenia, CKD IV  Significant Hospital Events: Including procedures, antibiotic start and stop dates in addition to other pertinent events   11/14: admit from [facility]   Interim History / Subjective:  Feels okay. Mild shortness of breath. No distress.   Objective    Blood pressure (!) 183/142, temperature 98.4 F (36.9 C), temperature source Oral, resp. rate 13, weight 91.4 kg, SpO2 97%.       No intake or output data in the 24 hours ending 01/08/23 1337 Filed Weights   01/08/23 1300  Weight: 91.4 kg    Examination: General: older appearing male, sitting in bed on [place], no acute distress HENT: mucous membranes moist, disconjugate gaze, PERRLA, anicteric sclera Lungs: crackles in lower lung fields, no wheezing, 3LNC  Cardiovascular: s1/s2. No appreciable murmur. RRR. No rub, gallop.  Abdomen: protuberant. Ventral hernia. +BS. Non-tender Extremities: no pitting edema. Neuro: alert, oriented, non focal exam  GU: defer   Resolved Hospital Problem list     Assessment & Plan:  Acute on chronic congestive heart failure exacerbation; TEE 07/16/22 EF 40-45%, LV RWMA, severe hypokinesis of LV, MVR. At [facility] ED, pro-BNP 10,800. Troponin 28>41. CXR with edema. Required BiPAP, nitroglycerin gtt, 60 lasix.  - consult heart failure team, appreciate recs - f/u EKG, CXR  - trend trop, likely demand  - con't nitroglycerin gtt, BP goal <160  - + Lasix IV 60mg  now  - start home metoprolol, imdur, hydralazine - BiPAP PRN, appears comfortable on 3LNC at this time - titrate O2 to maintain sat >92%  CKD IV; Baseline Cr 2.8-3.5; followed by Dr. [name]  - Cr @ [facility] 3.28  - trend bmp, mag, phos - replete elytes - strict I&O - Avoid nephrotoxic agents, renally dose medications - ensure adequate renal perfusion   Hypertension  - start home metop, imdur, hydralazine as above  - wean off nitroglycerin gtt   CAD  Mitral valve regurg  - consult advanced heart failure for input, appreciate recs   Type 2  Diabetes  - f/u A1c - SSI  - cbg q4h   History of DVT  - con't home Eliquis   Schizophrenia  - con't home Zyprexa   Hyperlipidemia  - con't home atorvastatin 80mg  daily   GERD  - con't home ppi   Best Practice (right click and "Reselect all  SmartList Selections" daily)   Diet/type: NPO DVT prophylaxis: DOAC GI prophylaxis: PPI Lines: N/A Foley:  N/A Code Status:  full code Last date of multidisciplinary goals of care discussion []   Labs   CBC: No results for input(s): "WBC", "NEUTROABS", "HGB", "HCT", "MCV", "PLT" in the last 168 hours.  Basic Metabolic Panel: No results for input(s): "NA", "K", "CL", "CO2", "GLUCOSE", "BUN", "CREATININE", "CALCIUM", "MG", "PHOS" in the last 168 hours. GFR: CrCl cannot be calculated (Patient's most recent lab result is older than the maximum 21 days allowed.). No results for input(s): "PROCALCITON", "WBC", "LATICACIDVEN" in the last 168 hours.  Liver Function Tests: No results for input(s): "AST", "ALT", "ALKPHOS", "BILITOT", "PROT", "ALBUMIN" in the last 168 hours. No results for input(s): "LIPASE", "AMYLASE" in the last 168 hours. No results for input(s): "AMMONIA" in the last 168 hours.  ABG    Component Value Date/Time   PHART 7.342 (L) 07/03/2022 1541   PCO2ART 39.9 07/03/2022 1541   PO2ART 81 (L) 07/03/2022 1541   HCO3 21.6 07/03/2022 1541   TCO2 25 07/16/2022 0944   ACIDBASEDEF 4.0 (H) 07/03/2022 1541   O2SAT 95 07/03/2022 1541     Coagulation Profile: No results for input(s): "INR", "PROTIME" in the last 168 hours.  Cardiac Enzymes: No results for input(s): "CKTOTAL", "CKMB", "CKMBINDEX", "TROPONINI" in the last 168 hours.  HbA1C: Hgb A1c MFr Bld  Date/Time Value Ref Range Status  11/08/2014 08:47 PM 6.0 (H) 4.8 - 5.6 % Final    Comment:    (NOTE)         Pre-diabetes: 5.7 - 6.4         Diabetes: >6.4         Glycemic control for adults with diabetes: <7.0     CBG: Recent Labs  Lab 01/08/23 1313  GLUCAP 74    Review of Systems:   As above  Past Medical History:  He,  has a past medical history of Aortic insufficiency, C. difficile diarrhea (07/2020), CKD (chronic kidney disease) stage 3, GFR 30-59 ml/min (HCC), Combined systolic and diastolic  congestive heart failure (HCC) (05/06/2022), Coronary atherosclerosis of native coronary artery, Depressive disorder, not elsewhere classified, Dyspnea, Esophageal reflux, Gout, unspecified, Heart murmur, Hemorrhoids, HFrEF (heart failure with reduced ejection fraction) (HCC), History of DVT (deep vein thrombosis), Hyperlipidemia LDL goal <70, Hypertension, Hypotension, unspecified, Ischemic cardiomyopathy, Mitral valve disorders(424.0), Myocardial infarction (lateral wall) (HCC) (2013), PAD (peripheral artery disease) (HCC), Personal history of tobacco use, presenting hazards to health, Torn ligament, and Unspecified schizophrenia, unspecified condition.   Surgical History:   Past Surgical History:  Procedure Laterality Date   BIOPSY  12/17/2020   Procedure: BIOPSY;  Surgeon: Lanelle Bal, DO;  [facility];  Service: Endoscopy;;   COLONOSCOPY     COLONOSCOPY N/A 11/10/2014   Procedure: COLONOSCOPY;  Surgeon: Ruffin Frederick, MD;  Location: [facility] ENDOSCOPY;  Service: Gastroenterology;  Laterality: N/A;   COLONOSCOPY WITH PROPOFOL N/A 05/08/2015   Surgeon: West Bali, MD; nonthrombosed external hemorrhoids, one 8 mm tubular adenoma, one 4 mm tubular adenoma.  Repeat colonoscopy in 5-10 years.   COLONOSCOPY WITH PROPOFOL N/A 12/17/2020   Procedure:  COLONOSCOPY WITH PROPOFOL;  Surgeon: Lanelle Bal, DO;  [facility];  Service: Endoscopy;  Laterality: N/A;  8:30am   ESOPHAGOGASTRODUODENOSCOPY (EGD) WITH PROPOFOL N/A 05/08/2015   Surgeon: West Bali, MD; LA grade a reflux esophagitis, normal stomach and duodenum.   ESOPHAGOGASTRODUODENOSCOPY (EGD) WITH PROPOFOL N/A 12/17/2020   Procedure: ESOPHAGOGASTRODUODENOSCOPY (EGD) WITH PROPOFOL;  Surgeon: Lanelle Bal, DO;  [facility];  Service: Endoscopy;  Laterality: N/A;   EYE SURGERY Right    removal of foreign body   HEMORRHOID SURGERY N/A 02/20/2016   Procedure: EXTENSIVE HEMORRHOIDECTOMY;   Surgeon: Franky Macho, MD;  Location: [facility] ORS;  Service: General;  Laterality: N/A;   None     POLYPECTOMY  05/08/2015   Procedure: POLYPECTOMY;  Surgeon: West Bali, MD;  [facility];  Service: Endoscopy;;  transverse colon polyp   RIGHT/LEFT HEART CATH AND CORONARY ANGIOGRAPHY N/A 07/03/2022   Procedure: RIGHT/LEFT HEART CATH AND CORONARY ANGIOGRAPHY;  Surgeon: Tonny Bollman, MD;  Location: [facility] INVASIVE CV LAB;  Service: Cardiovascular;  Laterality: N/A;   TEE WITHOUT CARDIOVERSION N/A 05/26/2022   Procedure: TRANSESOPHAGEAL ECHOCARDIOGRAM (TEE);  Surgeon: Wendall Stade, MD;  Location: [facility] ENDOSCOPY;  Service: Cardiovascular;  Laterality: N/A;   TEE WITHOUT CARDIOVERSION N/A 07/16/2022   Procedure: TRANSESOPHAGEAL ECHOCARDIOGRAM;  Surgeon: Tonny Bollman, MD;  Location: [facility] INVASIVE CV LAB;  Service: Cardiovascular;  Laterality: N/A;   TRANSCATHETER MITRAL EDGE TO EDGE REPAIR N/A 07/16/2022   Procedure: MITRAL VALVE REPAIR;  Surgeon: Tonny Bollman, MD;  Location: [facility] INVASIVE CV LAB;  Service: Cardiovascular;  Laterality: N/A;     Social History:   reports that he quit smoking about 26 years ago. His smoking use included cigarettes. He started smoking about 49 years ago. He has a 23 pack-year smoking history. He has never been exposed to tobacco smoke. He has never used smokeless tobacco. He reports that he does not currently use drugs after having used the following drugs: Cocaine. He reports that he does not drink alcohol.   Family History:  His family history includes Crohn's disease in his sister. There is no history of Colon cancer.   Allergies Allergies  Allergen Reactions   Nifedipine Diarrhea   Aciphex [Rabeprazole Sodium] Diarrhea   Benazepril Other (See Comments)    Unknown   Haloperidol Anxiety    Causes severe anxiety   Prilosec [Omeprazole] Diarrhea     Home Medications  Prior to Admission medications   Medication Sig Start Date End Date Taking?  Authorizing Provider  acetaminophen (TYLENOL) 325 MG tablet Take 2 tablets (650 mg total) by mouth every 4 (four) hours as needed for fever, headache or mild pain. 09/08/22   Marguerita Merles Latif, DO  apixaban (ELIQUIS) 5 MG TABS tablet Take 2.5 mg by mouth 2 (two) times daily.    [provider]  atorvastatin (LIPITOR) 80 MG tablet Take 1 tablet (80 mg total) by mouth daily. 07/11/22   Laurey Morale, MD  brimonidine (ALPHAGAN) 0.2 % ophthalmic solution Place 1 drop into both eyes daily. 04/18/22   [provider]  calcitRIOL (ROCALTROL) 0.25 MCG capsule Take 0.25 mcg by mouth every Monday, Wednesday, and Friday. 06/13/22 06/13/23  [provider]  colchicine 0.6 MG tablet Take 0.5 tablets (0.3 mg total) by mouth daily. 07/11/22   Laurey Morale, MD  Daridorexant HCl 25 MG TABS Take 25 mg by mouth at bedtime.    [provider]  DOCUSATE SODIUM PO Take  1 capsule by mouth daily.    [provider]  esomeprazole (NEXIUM) 20 MG capsule Take 1 capsule (20 mg total) by mouth daily before breakfast. 11/08/20   Letta Median, PA-C  folic acid (FOLVITE) 1 MG tablet Take 1 tablet (1 mg total) by mouth daily. Patient not taking: Reported on 09/16/2022 09/09/22   Marguerita Merles Latif, DO  furosemide (LASIX) 40 MG tablet Take 1 tablet (40 mg total) by mouth daily as needed for edema or fluid. 11/25/22   Iran Ouch, MD  hydrALAZINE (APRESOLINE) 25 MG tablet Take 1 tablet (25 mg total) by mouth 3 (three) times daily. 09/16/22   Iran Ouch, MD  iron polysaccharides (NIFEREX) 150 MG capsule Take 1 capsule (150 mg total) by mouth daily. 09/09/22   Marguerita Merles Latif, DO  isosorbide mononitrate (IMDUR) 30 MG 24 hr tablet Take 30 mg by mouth daily.    [provider]  latanoprost (XALATAN) 0.005 % ophthalmic solution Place 1 drop into both eyes at bedtime. 10/11/20   [provider]  melatonin 3 MG TABS tablet Take 3 mg by mouth at bedtime.  04/16/22   [provider]  metoprolol succinate (TOPROL-XL) 100 MG 24 hr tablet TAKE 1 TABLET(100 MG) BY MOUTH DAILY WITH OR IMMEDIATELY FOLLOWING A MEAL 01/07/23   Iran Ouch, MD  nitroGLYCERIN (NITROSTAT) 0.4 MG SL tablet Dissolve 1 tablet under the tongue every 5 minutes as needed for chest pain. Max of 3 doses, then 911. 06/05/22   Tonny Bollman, MD  OLANZapine (ZYPREXA) 10 MG tablet Take 5-15 mg by mouth See admin instructions. Take 5 mg in the morning and 15 mg at night 03/24/22   [provider]  potassium chloride SA (KLOR-CON M) 20 MEQ tablet Take 20 mEq by mouth 2 (two) times daily. 09/10/22   [provider]  predniSONE (STERAPRED UNI-PAK 21 TAB) 10 MG (21) TBPK tablet Take 6 tablets on day 1, 5 tablets on day 2, 4 tablets on day 3, 3 tablets on day 4, 2 tablets on day 5, 1 tablet on day 6 and then stop 09/08/22   Marguerita Merles Latif, DO  risperiDONE (RISPERDAL) 1 MG tablet Take 1 tablet (1 mg total) by mouth at bedtime. Patient not taking: Reported on 09/16/2022 05/09/22   Zannie Cove, MD  sertraline (ZOLOFT) 100 MG tablet Take 100 mg by mouth daily.    [provider]  sodium bicarbonate 650 MG tablet Take 650 mg by mouth 2 (two) times daily.    [provider]     Critical care time: 5    Lenard Galloway [facility] 01/08/23 2:44 PM  Please see [email or web address] for pager details.  From 7A-7P if no response, please call [phone] After hours, please call ELink [phone]

## 2023-01-09 DIAGNOSIS — J810 Acute pulmonary edema: Secondary | ICD-10-CM | POA: Diagnosis not present

## 2023-01-09 LAB — GLUCOSE, CAPILLARY
Glucose-Capillary: 78 mg/dL (ref 70–99)
Glucose-Capillary: 81 mg/dL (ref 70–99)
Glucose-Capillary: 93 mg/dL (ref 70–99)

## 2023-01-09 LAB — CBC
HCT: 33.8 % — ABNORMAL LOW (ref 39.0–52.0)
Hemoglobin: 10.4 g/dL — ABNORMAL LOW (ref 13.0–17.0)
MCH: 30.2 pg (ref 26.0–34.0)
MCHC: 30.8 g/dL (ref 30.0–36.0)
MCV: 98.3 fL (ref 80.0–100.0)
Platelets: 102 10*3/uL — ABNORMAL LOW (ref 150–400)
RBC: 3.44 MIL/uL — ABNORMAL LOW (ref 4.22–5.81)
RDW: 13.2 % (ref 11.5–15.5)
WBC: 6.5 10*3/uL (ref 4.0–10.5)
nRBC: 0 % (ref 0.0–0.2)

## 2023-01-09 LAB — BASIC METABOLIC PANEL
Anion gap: 9 (ref 5–15)
BUN: 39 mg/dL — ABNORMAL HIGH (ref 8–23)
CO2: 27 mmol/L (ref 22–32)
Calcium: 8.6 mg/dL — ABNORMAL LOW (ref 8.9–10.3)
Chloride: 106 mmol/L (ref 98–111)
Creatinine, Ser: 3.72 mg/dL — ABNORMAL HIGH (ref 0.61–1.24)
GFR, Estimated: 17 mL/min — ABNORMAL LOW (ref >60)
Glucose, Bld: 86 mg/dL (ref 70–99)
Potassium: 4.5 mmol/L (ref 3.5–5.1)
Sodium: 142 mmol/L (ref 135–145)

## 2023-01-09 MED ORDER — BRIMONIDINE TARTRATE 0.2 % OP SOLN
1.0000 [drp] | Freq: Every day | OPHTHALMIC | Status: DC
  Administered 2023-01-09 – 2023-01-12 (×4): 1 [drp] via OPHTHALMIC
  Filled 2023-01-09: qty 5

## 2023-01-09 MED ORDER — CHLORHEXIDINE GLUCONATE CLOTH 2 % EX PADS
6.0000 | MEDICATED_PAD | Freq: Every day | CUTANEOUS | Status: DC
  Administered 2023-01-09 – 2023-01-10 (×2): 6 via TOPICAL

## 2023-01-09 NOTE — Progress Notes (Signed)
Heart Failure Navigator Progress Note  Assessed for Heart & Vascular TOC clinic readiness.  Patient does not meet criteria due to Advanced Heart Failure Team patient of Dr. McLean.   Navigator will sign off at this time.   Mahreen Schewe, BSN, RN Heart Failure Nurse Navigator Secure Chat Only   

## 2023-01-09 NOTE — Plan of Care (Signed)
  Problem: Education: Goal: Knowledge of General Education information will improve Description Including pain rating scale, medication(s)/side effects and non-pharmacologic comfort measures Outcome: Progressing   

## 2023-01-09 NOTE — Progress Notes (Signed)
NAME:  Glenn Hawkins, MRN:  [id number], DOB:  28-Aug-1955, LOS: 1 ADMISSION DATE:  01/08/2023, CONSULTATION DATE:  01/08/2023 REFERRING MD:  Lanier Prude, CHIEF COMPLAINT:  pulmonary edema   History of Present Illness:  67 year old male with past medical history of hypertension, type 2 diabetes, hyperlipidemia, CAD, systolic CHF, ischemic cardiomyopathy, severe mitral regurgitation, IDA, schizophrenia, CKD IV who presents as transfer from [facility] emergency department for pulmonary edema. Per EDP note, presented via EMS for shortness of breath. He had lab work up including trop 41, Na 146, K 3.9, BUN 32, Cr 3.28, wbc 9.4, hgb 11.3, pro-BNP 10,800, covid/flu/rsv negative. CXR with pulmonary edema. Given 60 IV lasix, started on nitroglycerin gtt. Patient requested admission to [facility] where he receives care.   On admission to [facility], patient is awake, alert, oriented. On 3LNC. Talking in complete sentences. He states symptoms began last night. States he felt "agitated" last night before bed. Took his medications. This morning he was woken up by his shortness of breath. No chest pain. Endorses taking his medications as prescribed without missed doses. No recent changes to his medications.   Of note, recent admission 11/23/22 to [facility] for AKI. Found to have Cr of 6, fatigued. Given fluid resuscitation with improvement. Admitted 07/16/22 for severe mitral regurgitation. Admitted 05/06/22-05/09/22 for CHF exacerbation. Given IV lasix and discharged with cardiology follow-up.   Pertinent  Medical History  hypertension, type 2 diabetes, hyperlipidemia, CAD, systolic CHF, ischemic cardiomyopathy, severe mitral regurgitation, IDA, schizophrenia, CKD IV  Significant Hospital Events: Including procedures, antibiotic start and stop dates in addition to other pertinent events   11/14: admit from [facility]  11/15-weaned off nitroglycerin  Interim History / Subjective:  Feels a little better Shortness of  breath is better  Objective   Blood pressure 139/87, pulse 70, temperature 98.6 F (37 C), temperature source Oral, resp. rate 15, weight 92.1 kg, SpO2 94%.        Intake/Output Summary (Last 24 hours) at 01/09/2023 1237 Last data filed at 01/09/2023 1200 Gross per 24 hour  Intake 98.25 ml  Output 1200 ml  Net -1101.75 ml   Filed Weights   01/08/23 1300 01/09/23 0346  Weight: 91.4 kg 92.1 kg    Examination: General: Elderly, does not appear to be in distress HENT: Moist oral mucosa, pupils reacting Lungs: Does have rales at the bases bilaterally S1-S2 Cardiovascular: She dated with no murmur Abdomen: Protuberant, bowel sounds appreciated Extremities: no pitting edema. Neuro: alert, oriented, non focal exam  GU: defer   Resolved Hospital Problem list     Assessment & Plan:   Acute on chronic congestive heart failure with exacerbation Acute pulmonary edema -Last TEE with ejection fraction of 40-45% with severe hypokinesis -Off nitroglycerin -Remains on diuretics -Titrate oxygen to off  Chronic kidney disease stage IV -Maintain renal perfusion -Monitor I's and O's -Avoid nephrotoxic medications -Renal dose medications  Hypertension -Home metoprolol, Imdur resumed  Coronary artery disease Mitral valve regurg -Continue monitoring  Type 2 diabetes -SSI -History of DVT -Continue Eliquis  History of schizophrenia -On home Zyprexa  Hyperlipidemia -Continue atorvastatin  Will transition to telemetry if he continues to remain stable Best Practice (right click and "Reselect all SmartList Selections" daily)   Diet/type: Regular consistency (see orders) DVT prophylaxis: DOAC GI prophylaxis: PPI Lines: N/A Foley:  N/A Code Status:  full code Last date of multidisciplinary goals of care discussion []   Labs   CBC: Recent Labs  Lab 01/08/23 1600 01/09/23 0757  WBC 7.7 6.5  HGB 10.6* 10.4*  HCT 34.3* 33.8*  MCV 100.0 98.3  PLT 102* 102*    Basic  Metabolic Panel: Recent Labs  Lab 01/08/23 1600 01/09/23 0757  NA 144 142  K 4.5 4.5  CL 109 106  CO2 23 27  GLUCOSE 86 86  BUN 31* 39*  CREATININE 3.28* 3.72*  CALCIUM 8.8* 8.6*  MG 1.9  --    GFR: Estimated Creatinine Clearance: 21.5 mL/min (A) (by C-G formula based on SCr of 3.72 mg/dL (H)). Recent Labs  Lab 01/08/23 1600 01/09/23 0757  WBC 7.7 6.5    Liver Function Tests: Recent Labs  Lab 01/08/23 1600  AST 18  ALT 14  ALKPHOS 93  BILITOT 0.8  PROT 5.9*  ALBUMIN 3.2*   No results for input(s): "LIPASE", "AMYLASE" in the last 168 hours. No results for input(s): "AMMONIA" in the last 168 hours.  ABG    Component Value Date/Time   PHART 7.342 (L) 07/03/2022 1541   PCO2ART 39.9 07/03/2022 1541   PO2ART 81 (L) 07/03/2022 1541   HCO3 21.6 07/03/2022 1541   TCO2 25 07/16/2022 0944   ACIDBASEDEF 4.0 (H) 07/03/2022 1541   O2SAT 95 07/03/2022 1541     Coagulation Profile: No results for input(s): "INR", "PROTIME" in the last 168 hours.  Cardiac Enzymes: No results for input(s): "CKTOTAL", "CKMB", "CKMBINDEX", "TROPONINI" in the last 168 hours.  HbA1C: Hgb A1c MFr Bld  Date/Time Value Ref Range Status  01/08/2023 04:00 PM 5.7 (H) 4.8 - 5.6 % Final    Comment:    (NOTE) Pre diabetes:          5.7%-6.4%  Diabetes:              >6.4%  Glycemic control for   <7.0% adults with diabetes   11/08/2014 08:47 PM 6.0 (H) 4.8 - 5.6 % Final    Comment:    (NOTE)         Pre-diabetes: 5.7 - 6.4         Diabetes: >6.4         Glycemic control for adults with diabetes: <7.0     CBG: Recent Labs  Lab 01/08/23 1953 01/08/23 2353 01/09/23 0349 01/09/23 0813 01/09/23 1137  GLUCAP 137* 102* 78 81 93    Review of Systems:   As above  Past Medical History:  He,  has a past medical history of Aortic insufficiency, C. difficile diarrhea (07/2020), CKD (chronic kidney disease) stage 3, GFR 30-59 ml/min (HCC), Combined systolic and diastolic congestive heart  failure (HCC) (05/06/2022), Coronary atherosclerosis of native coronary artery, Depressive disorder, not elsewhere classified, Dyspnea, Esophageal reflux, Gout, unspecified, Heart murmur, Hemorrhoids, HFrEF (heart failure with reduced ejection fraction) (HCC), History of DVT (deep vein thrombosis), Hyperlipidemia LDL goal <70, Hypertension, Hypotension, unspecified, Ischemic cardiomyopathy, Mitral valve disorders(424.0), Myocardial infarction (lateral wall) (HCC) (2013), PAD (peripheral artery disease) (HCC), Personal history of tobacco use, presenting hazards to health, Torn ligament, and Unspecified schizophrenia, unspecified condition.   Surgical History:   Past Surgical History:  Procedure Laterality Date   BIOPSY  12/17/2020   Procedure: BIOPSY;  Surgeon: Lanelle Bal, DO;  [facility];  Service: Endoscopy;;   COLONOSCOPY     COLONOSCOPY N/A 11/10/2014   Procedure: COLONOSCOPY;  Surgeon: Ruffin Frederick, MD;  Location[facility] ENDOSCOPY;  Service: Gastroenterology;  Laterality: N/A;   COLONOSCOPY WITH PROPOFOL N/A 05/08/2015   Surgeon: West Bali, MD; nonthrombosed external hemorrhoids, one 8  mm tubular adenoma, one 4 mm tubular adenoma.  Repeat colonoscopy in 5-10 years.   COLONOSCOPY WITH PROPOFOL N/A 12/17/2020   Procedure: COLONOSCOPY WITH PROPOFOL;  Surgeon: Lanelle Bal, DO;  [facility];  Service: Endoscopy;  Laterality: N/A;  8:30am   ESOPHAGOGASTRODUODENOSCOPY (EGD) WITH PROPOFOL N/A 05/08/2015   Surgeon: West Bali, MD; LA grade a reflux esophagitis, normal stomach and duodenum.   ESOPHAGOGASTRODUODENOSCOPY (EGD) WITH PROPOFOL N/A 12/17/2020   Procedure: ESOPHAGOGASTRODUODENOSCOPY (EGD) WITH PROPOFOL;  Surgeon: Lanelle Bal, DO;  [facility];  Service: Endoscopy;  Laterality: N/A;   EYE SURGERY Right    removal of foreign body   HEMORRHOID SURGERY N/A 02/20/2016   Procedure: EXTENSIVE HEMORRHOIDECTOMY;  Surgeon: Franky Macho, MD;  Location: [facility] ORS;  Service: General;  Laterality: N/A;   None     POLYPECTOMY  05/08/2015   Procedure: POLYPECTOMY;  Surgeon: West Bali, MD;  [facility];  Service: Endoscopy;;  transverse colon polyp   RIGHT/LEFT HEART CATH AND CORONARY ANGIOGRAPHY N/A 07/03/2022   Procedure: RIGHT/LEFT HEART CATH AND CORONARY ANGIOGRAPHY;  Surgeon: Tonny Bollman, MD;  Location: [facility] INVASIVE CV LAB;  Service: Cardiovascular;  Laterality: N/A;   TEE WITHOUT CARDIOVERSION N/A 05/26/2022   Procedure: TRANSESOPHAGEAL ECHOCARDIOGRAM (TEE);  Surgeon: Wendall Stade, MD;  Location: [facility] ENDOSCOPY;  Service: Cardiovascular;  Laterality: N/A;   TEE WITHOUT CARDIOVERSION N/A 07/16/2022   Procedure: TRANSESOPHAGEAL ECHOCARDIOGRAM;  Surgeon: Tonny Bollman, MD;  Location: [facility] INVASIVE CV LAB;  Service: Cardiovascular;  Laterality: N/A;   TRANSCATHETER MITRAL EDGE TO EDGE REPAIR N/A 07/16/2022   Procedure: MITRAL VALVE REPAIR;  Surgeon: Tonny Bollman, MD;  Location: [facility] INVASIVE CV LAB;  Service: Cardiovascular;  Laterality: N/A;     Social History:   reports that he quit smoking about 26 years ago. His smoking use included cigarettes. He started smoking about 49 years ago. He has a 23 pack-year smoking history. He has never been exposed to tobacco smoke. He has never used smokeless tobacco. He reports that he does not currently use drugs after having used the following drugs: Cocaine. He reports that he does not drink alcohol.   Family History:  His family history includes Crohn's disease in his sister. There is no history of Colon cancer.   Allergies Allergies  Allergen Reactions   Nifedipine Diarrhea   Aciphex [Rabeprazole Sodium] Diarrhea   Benazepril Other (See Comments)    Unknown   Haloperidol Anxiety    Causes severe anxiety   Prilosec [Omeprazole] Diarrhea     The patient is critically ill with multiple organ systems failure and requires high complexity decision making for  assessment and support, frequent evaluation and titration of therapies, application of advanced monitoring technologies and extensive interpretation of multiple databases. Critical Care Time devoted to patient care services described in this note independent of APP/resident time (if applicable)  is 33 minutes.   Virl Diamond MD [facility] Personal pager: See Amion If unanswered, please page CCM On-call: [phone]

## 2023-01-10 DIAGNOSIS — I509 Heart failure, unspecified: Secondary | ICD-10-CM

## 2023-01-10 DIAGNOSIS — N184 Chronic kidney disease, stage 4 (severe): Secondary | ICD-10-CM

## 2023-01-10 DIAGNOSIS — D638 Anemia in other chronic diseases classified elsewhere: Secondary | ICD-10-CM

## 2023-01-10 MED ORDER — BISACODYL 5 MG PO TBEC
10.0000 mg | DELAYED_RELEASE_TABLET | Freq: Once | ORAL | Status: AC
  Administered 2023-01-10: 10 mg via ORAL
  Filled 2023-01-10: qty 2

## 2023-01-10 MED ORDER — FUROSEMIDE 40 MG PO TABS
40.0000 mg | ORAL_TABLET | Freq: Every day | ORAL | Status: DC
  Administered 2023-01-10 – 2023-01-12 (×3): 40 mg via ORAL
  Filled 2023-01-10 (×3): qty 1

## 2023-01-10 NOTE — Plan of Care (Signed)
  Problem: Education: Goal: Knowledge of General Education information will improve Description: Including pain rating scale, medication(s)/side effects and non-pharmacologic comfort measures Outcome: Progressing   Problem: Clinical Measurements: Goal: Ability to maintain clinical measurements within normal limits will improve Outcome: Progressing   Problem: Clinical Measurements: Goal: Will remain free from infection Outcome: Progressing   Problem: Clinical Measurements: Goal: Respiratory complications will improve Outcome: Progressing   Problem: Clinical Measurements: Goal: Cardiovascular complication will be avoided Outcome: Progressing

## 2023-01-10 NOTE — Progress Notes (Signed)
[facility]                                                                               Nicklas Heintz, is a 67 y.o. male, DOB - 1955/05/27, [id number] Admit date - 01/08/2023    Outpatient Primary MD for the patient is Vyas, Dhruv B, MD  LOS - 2  days    Brief summary    67 year old male with past medical history of hypertension, type 2 diabetes, hyperlipidemia, CAD, systolic CHF, ischemic cardiomyopathy, severe mitral regurgitation, IDA, schizophrenia, CKD IV who presents as transfer from [facility] emergency department for pulmonary edema.   Of note, recent admission 11/23/22 to [facility] for AKI. Found to have Cr of 6, fatigued. Given fluid resuscitation with improvement. Admitted 07/16/22 for severe mitral regurgitation. Admitted 05/06/22-05/09/22 for CHF exacerbation. Given IV lasix and discharged with cardiology follow-up.    Assessment & Plan    Assessment and Plan:   Acute on chronic congestive heart failure  Acute pulm edema Continue with lasix 40 mg daily.  Weaned off oxygen.    Stage 4 CKD Creatinine appears to be at baseline.     Hypertension BP Parameters are optimal.    CAD Pt denies any chest pain.    Anemia of chronic disease  Hemoglobin around 10.    Thrombocytopenia.  Platelets around 102000.      Estimated body mass index is 30.74 kg/m as calculated from the following:   Height as of this encounter: 5\' 8"  (1.727 m).   Weight as of this encounter: 91.7 kg.  Code Status: full code.  DVT Prophylaxis:  apixaban (ELIQUIS) tablet 2.5 mg Start: 01/08/23 1545 SCDs Start: 01/08/23 1315 apixaban (ELIQUIS) tablet 2.5 mg   Level of Care: Level of care: Telemetry Cardiac Family Communication: none at bedside.   Disposition Plan:     Remains inpatient appropriate:  pending. Possibly home in am.   Procedures:  None.   Consultants:   Pccm.   Antimicrobials:   Anti-infectives (From admission, onward)    None         Medications  Scheduled Meds:  acetaminophen  1,000 mg Oral Q6H   apixaban  2.5 mg Oral BID   atorvastatin  80 mg Oral Daily   bisacodyl  10 mg Oral Once   brimonidine  1 drop Both Eyes Daily   Chlorhexidine Gluconate Cloth  6 each Topical Daily   isosorbide mononitrate  30 mg Oral Daily   metoprolol succinate  100 mg Oral Daily   OLANZapine  5 mg Oral Daily   And   OLANZapine  15 mg Oral QHS   pantoprazole  40 mg Oral Daily   sertraline  100 mg Oral Daily   Continuous Infusions: PRN Meds:.acetaminophen, docusate sodium, mouth rinse, polyethylene glycol    Subjective:   Thomos Stasiak was seen and examined today.  Not feeling great.   Objective:   Vitals:   01/10/23 0027 01/10/23 0300 01/10/23 0409 01/10/23 0840  BP: (!) 143/86  (!) 154/87 (!) 157/81  Pulse: 86  86 87  Resp: 18  20 18   Temp: 99.5 F (37.5 C)  98.9 F (37.2 C) 99.8 F (  37.7 C)  TempSrc: Oral Oral Oral Oral  SpO2: 94%  93% 93%  Weight:   91.7 kg   Height:        Intake/Output Summary (Last 24 hours) at 01/10/2023 0937 Last data filed at 01/09/2023 2000 Gross per 24 hour  Intake 240 ml  Output 550 ml  Net -310 ml   Filed Weights   01/09/23 0346 01/09/23 1549 01/10/23 0409  Weight: 92.1 kg 91.4 kg 91.7 kg     Exam General exam: Appears calm and comfortable  Respiratory system: Clear to auscultation. Respiratory effort normal. Cardiovascular system: S1 & S2 heard, RRR. Gastrointestinal system: Abdomen is nondistended, soft and nontender.  Central nervous system: Alert and oriented.  Extremities: Symmetric 5 x 5 power. Skin: No rashes,  Psychiatry:  Mood & affect appropriate.     Data Reviewed:  I have personally reviewed following labs and imaging studies   CBC Lab Results  Component Value Date   WBC 6.5 01/09/2023   RBC 3.44 (L) 01/09/2023   HGB 10.4 (L) 01/09/2023   HCT 33.8 (L) 01/09/2023   MCV 98.3 01/09/2023   MCH 30.2 01/09/2023   PLT 102 (L) 01/09/2023   MCHC  30.8 01/09/2023   RDW 13.2 01/09/2023   LYMPHSABS 0.7 09/08/2022   MONOABS 0.6 09/08/2022   EOSABS 0.0 09/08/2022   BASOSABS 0.0 09/08/2022     Last metabolic panel Lab Results  Component Value Date   NA 142 01/09/2023   K 4.5 01/09/2023   CL 106 01/09/2023   CO2 27 01/09/2023   BUN 39 (H) 01/09/2023   CREATININE 3.72 (H) 01/09/2023   GLUCOSE 86 01/09/2023   GFRNONAA 17 (L) 01/09/2023   GFRAA 25 (L) 08/02/2019   CALCIUM 8.6 (L) 01/09/2023   PHOS 2.4 (L) 09/08/2022   PROT 5.9 (L) 01/08/2023   ALBUMIN 3.2 (L) 01/08/2023   LABGLOB 2.5 07/14/2022   AGRATIO 1.5 07/14/2022   BILITOT 0.8 01/08/2023   ALKPHOS 93 01/08/2023   AST 18 01/08/2023   ALT 14 01/08/2023   ANIONGAP 9 01/09/2023    CBG (last 3)  Recent Labs    01/09/23 0349 01/09/23 0813 01/09/23 1137  GLUCAP 78 81 93      Coagulation Profile: No results for input(s): "INR", "PROTIME" in the last 168 hours.   Radiology Studies: DG Chest Port 1 View  Result Date: 01/08/2023 CLINICAL DATA:  Heart failure. EXAM: PORTABLE CHEST 1 VIEW COMPARISON:  Same day. FINDINGS: Stable cardiomegaly with central pulmonary vascular congestion. No consolidative process. The visualized skeletal structures are unremarkable. IMPRESSION: Stable cardiomegaly with central pulmonary vascular congestion. Electronically Signed   By: Lupita Raider M.D.   On: 01/08/2023 17:04       Kathlen Mody M.D. [facility] 01/10/2023, 9:37 AM  Available via Epic secure chat 7am-7pm After 7 pm, please refer to night coverage provider listed on amion.

## 2023-01-11 ENCOUNTER — Other Ambulatory Visit: Payer: Self-pay | Admitting: Internal Medicine

## 2023-01-11 DIAGNOSIS — I509 Heart failure, unspecified: Secondary | ICD-10-CM | POA: Diagnosis not present

## 2023-01-11 DIAGNOSIS — N184 Chronic kidney disease, stage 4 (severe): Secondary | ICD-10-CM | POA: Diagnosis not present

## 2023-01-11 DIAGNOSIS — D638 Anemia in other chronic diseases classified elsewhere: Secondary | ICD-10-CM | POA: Diagnosis not present

## 2023-01-11 LAB — BASIC METABOLIC PANEL
Anion gap: 8 (ref 5–15)
BUN: 48 mg/dL — ABNORMAL HIGH (ref 8–23)
CO2: 25 mmol/L (ref 22–32)
Calcium: 9.2 mg/dL (ref 8.9–10.3)
Chloride: 109 mmol/L (ref 98–111)
Creatinine, Ser: 3.32 mg/dL — ABNORMAL HIGH (ref 0.61–1.24)
GFR, Estimated: 20 mL/min — ABNORMAL LOW (ref >60)
Glucose, Bld: 175 mg/dL — ABNORMAL HIGH (ref 70–99)
Potassium: 4 mmol/L (ref 3.5–5.1)
Sodium: 142 mmol/L (ref 135–145)

## 2023-01-11 MED ORDER — AMLODIPINE BESYLATE 10 MG PO TABS
10.0000 mg | ORAL_TABLET | Freq: Every day | ORAL | Status: DC
  Administered 2023-01-11 – 2023-01-12 (×2): 10 mg via ORAL
  Filled 2023-01-11 (×2): qty 1

## 2023-01-11 MED ORDER — ISOSORBIDE MONONITRATE ER 60 MG PO TB24
60.0000 mg | ORAL_TABLET | Freq: Every day | ORAL | Status: DC
  Administered 2023-01-12: 60 mg via ORAL
  Filled 2023-01-11: qty 1

## 2023-01-11 MED ORDER — ISOSORBIDE MONONITRATE ER 30 MG PO TB24
30.0000 mg | ORAL_TABLET | Freq: Once | ORAL | Status: AC
  Administered 2023-01-11: 30 mg via ORAL
  Filled 2023-01-11: qty 1

## 2023-01-11 NOTE — Progress Notes (Signed)
[facility]                                                                               Glenn Hawkins, is a 67 y.o. male, DOB - 01/01/1956, [id number] Admit date - 01/08/2023    Outpatient Primary MD for the patient is Vyas, Dhruv B, MD  LOS - 3  days    Brief summary    67 year old male with past medical history of hypertension, type 2 diabetes, hyperlipidemia, CAD, systolic CHF, ischemic cardiomyopathy, severe mitral regurgitation, IDA, schizophrenia, CKD IV who presents as transfer from [facility] emergency department for pulmonary edema.   Of note, recent admission 11/23/22 to [facility] for AKI. Found to have Cr of 6, fatigued. Given fluid resuscitation with improvement. Admitted 07/16/22 for severe mitral regurgitation. Admitted 05/06/22-05/09/22 for CHF exacerbation. Given IV lasix and discharged with cardiology follow-up.    Assessment & Plan    Assessment and Plan:   Acute on chronic congestive heart failure  Acute pulm edema Last echocardiogram showed LV EF of 40 to 45% with regional wall motion abn.  Continue with lasix 40 mg daily.  Weaned off oxygen.    Stage 4 CKD Creatinine appears to be at baseline.     Hypertension BP Parameters still elevated.  Added norvasc 10 mg daily, imdur 60 mg daily, metoprolol 100 mg daily.  CAD Pt denies any chest pain.    Anemia of chronic disease  Hemoglobin around 10.    Thrombocytopenia.  Platelets around 102000.      Estimated body mass index is 30.44 kg/m as calculated from the following:   Height as of this encounter: 5\' 8"  (1.727 m).   Weight as of this encounter: 90.8 kg.  Code Status: full code.  DVT Prophylaxis:  apixaban (ELIQUIS) tablet 2.5 mg Start: 01/08/23 1545 SCDs Start: 01/08/23 1315 apixaban (ELIQUIS) tablet 2.5 mg   Level of Care: Level of care: Telemetry Cardiac Family Communication: none at bedside.   Disposition Plan:     Remains inpatient appropriate:  possible discharge  home in am if bp improves.   Procedures:  None.   Consultants:   Pccm.   Antimicrobials:   Anti-infectives (From admission, onward)    None        Medications  Scheduled Meds:  amLODipine  10 mg Oral Daily   apixaban  2.5 mg Oral BID   atorvastatin  80 mg Oral Daily   brimonidine  1 drop Both Eyes Daily   Chlorhexidine Gluconate Cloth  6 each Topical Daily   furosemide  40 mg Oral Daily   [START ON 01/12/2023] isosorbide mononitrate  60 mg Oral Daily   metoprolol succinate  100 mg Oral Daily   OLANZapine  5 mg Oral Daily   And   OLANZapine  15 mg Oral QHS   pantoprazole  40 mg Oral Daily   sertraline  100 mg Oral Daily   Continuous Infusions: PRN Meds:.acetaminophen, docusate sodium, mouth rinse, polyethylene glycol    Subjective:   Glenn Hawkins was seen and examined today.  Still some sob on ambulation. Not feeling great.   Objective:   Vitals:   01/11/23 0500 01/11/23 0750  01/11/23 0930 01/11/23 1204  BP: (!) 170/93 (!) 169/101 (!) 172/93 (!) 162/89  Pulse:  95 88   Resp: 17 18  18   Temp: 98.3 F (36.8 C) 97.8 F (36.6 C)  97.9 F (36.6 C)  TempSrc: Oral Oral  Oral  SpO2: 95% 97% 94%   Weight: 90.8 kg     Height:        Intake/Output Summary (Last 24 hours) at 01/11/2023 1615 Last data filed at 01/11/2023 0906 Gross per 24 hour  Intake 120 ml  Output 350 ml  Net -230 ml   Filed Weights   01/09/23 1549 01/10/23 0409 01/11/23 0500  Weight: 91.4 kg 91.7 kg 90.8 kg     Exam General exam: Appears calm and comfortable  Respiratory system: Clear to auscultation. Respiratory effort normal. Cardiovascular system: S1 & S2 heard, RRR. No JVD,  Gastrointestinal system: Abdomen is nondistended, soft and nontender.  Central nervous system: Alert and oriented. No focal neurological deficits. Extremities: Symmetric 5 x 5 power. Skin: No rashes, Psychiatry:  Mood & affect appropriate.     Data Reviewed:  I have personally reviewed following labs  and imaging studies   CBC Lab Results  Component Value Date   WBC 6.5 01/09/2023   RBC 3.44 (L) 01/09/2023   HGB 10.4 (L) 01/09/2023   HCT 33.8 (L) 01/09/2023   MCV 98.3 01/09/2023   MCH 30.2 01/09/2023   PLT 102 (L) 01/09/2023   MCHC 30.8 01/09/2023   RDW 13.2 01/09/2023   LYMPHSABS 0.7 09/08/2022   MONOABS 0.6 09/08/2022   EOSABS 0.0 09/08/2022   BASOSABS 0.0 09/08/2022     Last metabolic panel Lab Results  Component Value Date   NA 142 01/11/2023   K 4.0 01/11/2023   CL 109 01/11/2023   CO2 25 01/11/2023   BUN 48 (H) 01/11/2023   CREATININE 3.32 (H) 01/11/2023   GLUCOSE 175 (H) 01/11/2023   GFRNONAA 20 (L) 01/11/2023   GFRAA 25 (L) 08/02/2019   CALCIUM 9.2 01/11/2023   PHOS 2.4 (L) 09/08/2022   PROT 5.9 (L) 01/08/2023   ALBUMIN 3.2 (L) 01/08/2023   LABGLOB 2.5 07/14/2022   AGRATIO 1.5 07/14/2022   BILITOT 0.8 01/08/2023   ALKPHOS 93 01/08/2023   AST 18 01/08/2023   ALT 14 01/08/2023   ANIONGAP 8 01/11/2023    CBG (last 3)  Recent Labs    01/09/23 0349 01/09/23 0813 01/09/23 1137  GLUCAP 78 81 93      Coagulation Profile: No results for input(s): "INR", "PROTIME" in the last 168 hours.   Radiology Studies: No results found.     Kathlen Mody M.D. [facility] 01/11/2023, 4:15 PM  Available via Epic secure chat 7am-7pm After 7 pm, please refer to night coverage provider listed on amion.

## 2023-01-11 NOTE — Progress Notes (Signed)
Mobility Specialist Progress Note:   01/11/23 1025  Mobility  Activity Ambulated with assistance in hallway  Level of Assistance Contact guard assist, steadying assist  Assistive Device None  Distance Ambulated (ft) 250 ft  Activity Response Tolerated well  Mobility Referral Yes  $Mobility charge 1 Mobility  Mobility Specialist Start Time (ACUTE ONLY) 1025  Mobility Specialist Stop Time (ACUTE ONLY) 1035  Mobility Specialist Time Calculation (min) (ACUTE ONLY) 10 min   Pt asleep upon arrival, somewhat difficult to arouse. Agreeable to mobility session. Pt able to ambulate with minG assist d/t minor unsteadiness throughout ambulation. SpO2 WFL on RA, pt back in bed with all needs met.   Addison Lank Mobility Specialist Please contact via SecureChat or  Rehab office at [phone]

## 2023-01-12 DIAGNOSIS — N184 Chronic kidney disease, stage 4 (severe): Secondary | ICD-10-CM | POA: Diagnosis not present

## 2023-01-12 DIAGNOSIS — I10 Essential (primary) hypertension: Secondary | ICD-10-CM | POA: Diagnosis not present

## 2023-01-12 DIAGNOSIS — I509 Heart failure, unspecified: Secondary | ICD-10-CM | POA: Diagnosis not present

## 2023-01-12 LAB — CREATININE, SERUM
Creatinine, Ser: 3.33 mg/dL — ABNORMAL HIGH (ref 0.61–1.24)
GFR, Estimated: 20 mL/min — ABNORMAL LOW (ref >60)

## 2023-01-12 MED ORDER — ISOSORBIDE MONONITRATE ER 60 MG PO TB24: 60.0000 mg | ORAL_TABLET | Freq: Every day | ORAL | 1 refills | Status: DC

## 2023-01-12 MED ORDER — CARVEDILOL 25 MG PO TABS
25.0000 mg | ORAL_TABLET | Freq: Two times a day (BID) | ORAL | Status: DC
  Administered 2023-01-12: 25 mg via ORAL
  Filled 2023-01-12: qty 1

## 2023-01-12 MED ORDER — COLCHICINE 0.3 MG HALF TABLET
0.3000 mg | ORAL_TABLET | Freq: Every day | ORAL | Status: DC
  Administered 2023-01-12: 0.3 mg via ORAL
  Filled 2023-01-12: qty 1

## 2023-01-12 MED ORDER — FOLIC ACID 1 MG PO TABS
1.0000 mg | ORAL_TABLET | Freq: Every day | ORAL | Status: DC
  Administered 2023-01-12: 1 mg via ORAL
  Filled 2023-01-12: qty 1

## 2023-01-12 MED ORDER — CARVEDILOL 12.5 MG PO TABS: 12.5000 mg | ORAL_TABLET | Freq: Two times a day (BID) | ORAL | 11 refills | Status: DC

## 2023-01-12 MED ORDER — SODIUM BICARBONATE 650 MG PO TABS
650.0000 mg | ORAL_TABLET | Freq: Two times a day (BID) | ORAL | Status: DC
  Administered 2023-01-12: 650 mg via ORAL
  Filled 2023-01-12: qty 1

## 2023-01-12 MED ORDER — POTASSIUM CHLORIDE CRYS ER 20 MEQ PO TBCR: 10.0000 meq | EXTENDED_RELEASE_TABLET | Freq: Every day | ORAL | 0 refills | Status: DC

## 2023-01-12 NOTE — TOC Initial Note (Addendum)
Transition of Care [facility]) - Initial/Assessment Note    Patient Details  Name: Glenn Hawkins MRN: [id number] Date of Birth: 02-Jan-1956  [facility] CM/SW Contact:    Leone Haven, RN Phone Number: 01/12/2023, 1:12 PM  Clinical Narrative:                 From home alone, has PCP and insurance on file, states has no HH services in place at this time , has a walker at home.  States family member (BIL)  will transport him home at [employer] and his two sisters , Drinda Butts and Eunice Blase  is support system, states gets medications from  [facility] in [place].  Pta self ambulatory .  He states he has home care thru the [place] that comes and sets up his pill box for him.    Expected Discharge Plan: Home/Self Care Barriers to Discharge: No Barriers Identified   Patient Goals and CMS Choice Patient states their goals for this hospitalization and ongoing recovery are:: return home   Choice offered to / list presented to : NA      Expected Discharge Plan and Services   Discharge Planning Services: CM Consult Post Acute Care Choice: NA Living arrangements for the past 2 months: Single Family Home Expected Discharge Date: 01/12/23               DME Arranged: N/A DME Agency: NA       HH Arranged: NA          Prior Living Arrangements/Services Living arrangements for the past 2 months: Single Family Home Lives with:: Self Patient language and need for interpreter reviewed:: Yes Do you feel safe going back to the place where you live?: Yes      Need for Family Participation in Patient Care: No (Comment) Care giver support system in place?: Yes (comment) Current home services: DME (walker) Criminal Activity/Legal Involvement Pertinent to Current Situation/Hospitalization: No - Comment as needed  Activities of Daily Living      Permission Sought/Granted Permission sought to share information with : Case Manager Permission granted to share information with : Yes, Verbal  Permission Granted              Emotional Assessment Appearance:: Appears stated age Attitude/Demeanor/Rapport: Engaged Affect (typically observed): Appropriate Orientation: : Oriented to Self, Oriented to Place, Oriented to  Time, Oriented to Situation Alcohol / Substance Use: Not Applicable Psych Involvement: No (comment)  Admission diagnosis:  Congestive heart failure (CHF) (HCC) [I50.9] Patient Active Problem List   Diagnosis Date Noted   Congestive heart failure (CHF) (HCC) 01/08/2023   AKI (acute kidney injury) (HCC) 08/30/2022   Severe mitral regurgitation 07/16/2022   Acute on chronic systolic heart failure (HCC) 07/03/2022   DOE (dyspnea on exertion) 05/16/2022   Combined systolic and diastolic congestive heart failure (HCC) 05/06/2022   Acute respiratory failure with hypoxia (HCC) 05/06/2022   Elevated troponin 05/06/2022   Iron deficiency anemia 05/06/2022   History of DVT (deep vein thrombosis) 05/06/2022   Chronic anticoagulation 05/06/2022   Thrombocytopenia (HCC) 05/06/2022   Paranoid schizophrenia (HCC) 05/06/2022   Unintentional weight loss 11/08/2020   Pancreatic lesion 11/08/2020   Increased ammonia level 11/08/2020   Abdominal pain 11/08/2020   Diarrhea 11/08/2020   Indigestion 11/08/2020   Abnormality of abdominal aorta 11/08/2020   Hyperlipidemia LDL goal <70    Hemorrhoids 04/18/2015   Colon polyps 04/18/2015   Rectal bleed 11/08/2014   CKD (chronic kidney disease) stage  3, GFR 30-59 ml/min (HCC) 11/08/2014   Hyperglycemia 11/08/2014   GERD without esophagitis 11/08/2014   Obesity 11/08/2014   Rectal bleeding 11/08/2014   Acute deep vein thrombosis (DVT) of femoral vein of right lower extremity (HCC) 10/18/2014   Chronic systolic heart failure (HCC) 12/09/2013   Cardiomyopathy, ischemic 06/07/2013   Mitral valve disorder    Coronary atherosclerosis of native coronary artery    Hypertension    Hyperlipidemia    PCP:  [name],  MD Pharmacy:   [facility] [phone] - [place], [place] - [address] AT [facility] [address] [place] [address] Phone: [phone] Fax: [phone]  [facility] - [place], [place] - [address] [place] [address] Phone: [phone] Fax: [phone]  [facility] - [place], [place] - [address] [place] [address] Phone: [phone] Fax: [phone]  [name] [address] [place] [address] Phone: [phone] Fax: [phone]     Social Determinants of Health (SDOH) Social History: SDOH Screenings   Food Insecurity: No Food Insecurity (08/30/2022)  Housing: Low Risk  (12/26/2022)  Transportation Needs: No Transportation Needs (12/26/2022)  Utilities: Not At Risk (08/30/2022)  Financial Resource Strain: Low Risk  (12/26/2022)  Physical Activity: Inactive (12/26/2022)  Social Connections: Unknown (10/11/2021)   [facility] from [facility]  Tobacco Use: Low Risk  (01/08/2023)   Received from [facility]  Recent Concern: Tobacco Use - Medium Risk (12/26/2022)   SDOH Interventions:     Readmission Risk Interventions    09/02/2022    3:42 PM  Readmission Risk Prevention Plan  Transportation Screening Complete  PCP or Specialist Appt within 3-5 Days Complete  HRI or Home Care Consult Complete  Social Work Consult for Recovery Care Planning/Counseling Complete  Palliative Care Screening Not Applicable  Medication Review Oceanographer) Referral to Pharmacy

## 2023-01-12 NOTE — Care Management Important Message (Signed)
Important Message  Patient Details  Name: Glenn Hawkins MRN: [id number] Date of Birth: 01/12/1956   Important Message Given:  Yes - Medicare IM  Patient discharge copy of the IM mailed to the patient home address.   [name] 01/12/2023, 2:57 PM

## 2023-01-12 NOTE — Discharge Summary (Signed)
Physician Discharge Summary   Patient: Glenn Hawkins MRN: [id number] DOB: 05-27-1955  Admit date:     01/08/2023  Discharge date: 01/12/2023  Discharge Physician: Kathlen Mody   PCP: [name], MD   Recommendations at discharge:  Please follow up with PCp in one week.  Please follow up with Nephrology in one week.  Please check cbc and bmp by the end of the week.  Please follow up with cardiology in 2 weeks.   Discharge Diagnoses: Principal Problem:   Congestive heart failure (CHF) [facility])    Hospital Course:  67 year old male with past medical history of hypertension, type 2 diabetes, hyperlipidemia, CAD, systolic CHF, ischemic cardiomyopathy, severe mitral regurgitation, IDA, schizophrenia, CKD IV who presents as transfer from [facility] emergency department for pulmonary edema.    Of note, recent admission 11/23/22 to [facility] for AKI. Found to have Cr of 6, fatigued. Given fluid resuscitation with improvement. Admitted 07/16/22 for severe mitral regurgitation. Admitted 05/06/22-05/09/22 for CHF exacerbation. Given IV lasix and discharged with cardiology follow-up.    Assessment and Plan:      Acute on chronic congestive heart failure  Acute pulm edema Last echocardiogram showed LV EF of 40 to 45% with regional wall motion abn.  Continue with lasix 40 mg daily.  Weaned off oxygen.      Stage 4 CKD Creatinine appears to be at baseline.        Hypertension BP Parameters still elevated.  Added norvasc 10 mg daily, imdur 60 mg daily,  changed metoprolol 100 mg daily to coreg 12.5 mg BID.    CAD Pt denies any chest pain.      Anemia of chronic disease  Hemoglobin around 10.      Thrombocytopenia.  Platelets around 102000.      Estimated body mass index is 30.44 kg/m as calculated from the following:   Height as of this encounter: 5\' 8"  (1.727 m).   Weight as of this encounter: 90.8 kg.        Consultants: pccm Procedures performed: none.    Disposition: Home Diet recommendation:  Discharge Diet Orders (From admission, onward)     Start     Ordered   01/12/23 0000  Diet - low sodium heart healthy        01/12/23 1254           Cardiac diet DISCHARGE MEDICATION: Allergies as of 01/12/2023       Reactions   Nifedipine Diarrhea   Aciphex [rabeprazole Sodium] Diarrhea   Benazepril Other (See Comments)   Unknown   Haloperidol Anxiety   Causes severe anxiety   Prilosec [omeprazole] Diarrhea        Medication List     STOP taking these medications    aspirin EC 81 MG tablet   metoprolol succinate 100 MG 24 hr tablet Commonly known as: TOPROL-XL   nitroGLYCERIN 0.4 MG SL tablet Commonly known as: Nitrostat   predniSONE 10 MG (21) Tbpk tablet Commonly known as: STERAPRED UNI-PAK 21 TAB       TAKE these medications    acetaminophen 325 MG tablet Commonly known as: TYLENOL Take 2 tablets (650 mg total) by mouth every 4 (four) hours as needed for fever, headache or mild pain.   amLODipine 10 MG tablet Commonly known as: NORVASC Take 10 mg by mouth at bedtime.   atorvastatin 80 MG tablet Commonly known as: LIPITOR Take 1 tablet (80 mg total) by mouth daily. What changed: when to take  this   brimonidine 0.2 % ophthalmic solution Commonly known as: ALPHAGAN Place 1 drop into both eyes daily.   calcitRIOL 0.25 MCG capsule Commonly known as: ROCALTROL Take 0.25 mcg by mouth every Monday, Wednesday, and Friday.   carvedilol 12.5 MG tablet Commonly known as: Coreg Take 1 tablet (12.5 mg total) by mouth 2 (two) times daily.   colchicine 0.6 MG tablet Take 0.5 tablets (0.3 mg total) by mouth daily. What changed: when to take this   Daridorexant HCl 25 MG Tabs Take 25 mg by mouth at bedtime.   DOCUSATE SODIUM PO Take 1 capsule by mouth daily.   Eliquis 5 MG Tabs tablet Generic drug: apixaban Take 2.5 mg by mouth 2 (two) times daily.   esomeprazole 20 MG capsule Commonly known as:  NexIUM Take 1 capsule (20 mg total) by mouth daily before breakfast.   folic acid 1 MG tablet Commonly known as: FOLVITE Take 1 tablet (1 mg total) by mouth daily. What changed: when to take this   furosemide 40 MG tablet Commonly known as: LASIX Take 1 tablet (40 mg total) by mouth daily as needed for edema or fluid. What changed: when to take this   isosorbide mononitrate 60 MG 24 hr tablet Commonly known as: IMDUR Take 1 tablet (60 mg total) by mouth daily. Start taking on: January 13, 2023 What changed:  medication strength how much to take   melatonin 3 MG Tabs tablet Take 3 mg by mouth at bedtime.   OLANZapine 10 MG tablet Commonly known as: ZYPREXA Take 5-15 mg by mouth See admin instructions. Take 5 mg in the morning and 15 mg at night   potassium chloride SA 20 MEQ tablet Commonly known as: KLOR-CON M Take 0.5 tablets (10 mEq total) by mouth daily. What changed:  how much to take when to take this   sertraline 100 MG tablet Commonly known as: ZOLOFT Take 100 mg by mouth daily.   sodium bicarbonate 650 MG tablet Take 650 mg by mouth 2 (two) times daily.   spironolactone 50 MG tablet Commonly known as: ALDACTONE Take 50 mg by mouth daily.        Follow-up Information     [name], MD. Go on 01/15/2023.   Specialty: Internal Medicine Why: @11 :00am Contact information: [address] [place] [address] [phone]                Discharge Exam: Filed Weights   01/10/23 0409 01/11/23 0500 01/12/23 0506  Weight: 91.7 kg 90.8 kg 90.1 kg   General exam: Appears calm and comfortable  Respiratory system: Clear to auscultation. Respiratory effort normal. Cardiovascular system: S1 & S2 heard, RRR. Gastrointestinal system: Abdomen is nondistended, soft and nontender.  Central nervous system: Alert and oriented. No focal neurological deficits. Extremities: Symmetric 5 x 5 power. Skin: No rashes, lesions or ulcers Psychiatry: Mood & affect  appropriate.    Condition at discharge: fair  The results of significant diagnostics from this hospitalization (including imaging, microbiology, ancillary and laboratory) are listed below for reference.   Imaging Studies: DG Chest Port 1 View  Result Date: 01/08/2023 CLINICAL DATA:  Heart failure. EXAM: PORTABLE CHEST 1 VIEW COMPARISON:  Same day. FINDINGS: Stable cardiomegaly with central pulmonary vascular congestion. No consolidative process. The visualized skeletal structures are unremarkable. IMPRESSION: Stable cardiomegaly with central pulmonary vascular congestion. Electronically Signed   By: Lupita Raider M.D.   On: 01/08/2023 17:04    Microbiology: Results for orders  placed or performed during the hospital encounter of 01/08/23  MRSA Next Gen by PCR, Nasal     Status: None   Collection Time: 01/08/23  1:05 PM   Specimen: Nasal Mucosa; Nasal Swab  Result Value Ref Range Status   MRSA by PCR Next Gen NOT DETECTED NOT DETECTED Final    Comment: (NOTE) The GeneXpert MRSA Assay (FDA approved for NASAL specimens only), is one component of a comprehensive MRSA colonization surveillance program. It is not intended to diagnose MRSA infection nor to guide or monitor treatment for MRSA infections. Test performance is not FDA approved in patients less than 64 years old. Performed at [facility], [address], [place], [place] [address]     Labs: CBC: Recent Labs  Lab 01/08/23 1600 01/09/23 0757  WBC 7.7 6.5  HGB 10.6* 10.4*  HCT 34.3* 33.8*  MCV 100.0 98.3  PLT 102* 102*   Basic Metabolic Panel: Recent Labs  Lab 01/08/23 1600 01/09/23 0757 01/11/23 0936 01/12/23 1103  NA 144 142 142  --   K 4.5 4.5 4.0  --   CL 109 106 109  --   CO2 23 27 25   --   GLUCOSE 86 86 175*  --   BUN 31* 39* 48*  --   CREATININE 3.28* 3.72* 3.32* 3.33*  CALCIUM 8.8* 8.6* 9.2  --   MG 1.9  --   --   --    Liver Function Tests: Recent Labs  Lab 01/08/23 1600  AST 18  ALT  14  ALKPHOS 93  BILITOT 0.8  PROT 5.9*  ALBUMIN 3.2*   CBG: Recent Labs  Lab 01/08/23 1953 01/08/23 2353 01/09/23 0349 01/09/23 0813 01/09/23 1137  GLUCAP 137* 102* 78 81 93    Discharge time spent: 39 minutes.   Signed: Kathlen Mody, MD [facility] 01/12/2023

## 2023-01-12 NOTE — Progress Notes (Signed)
Patient has been given discharge instructions to include medications and follow up appointments. Patient verbalizes understanding of instructions. Awaiting transport to discharge lounge.

## 2023-01-12 NOTE — TOC Transition Note (Signed)
Transition of Care [facility]) - CM/SW Discharge Note   Patient Details  Name: Glenn Hawkins MRN: [id number] Date of Birth: Oct 02, 1955  Transition of Care [facility]) CM/SW Contact:  Leone Haven, RN Phone Number: 01/12/2023, 1:14 PM   Clinical Narrative:    For dc today, ambulated with mobility specialist, has no needs.  He states his BIL will transport him home.   Final next level of care: Home/Self Care Barriers to Discharge: No Barriers Identified   Patient Goals and CMS Choice   Choice offered to / list presented to : NA  Discharge Placement                         Discharge Plan and Services Additional resources added to the After Visit Summary for     Discharge Planning Services: CM Consult Post Acute Care Choice: NA          DME Arranged: N/A DME Agency: NA       HH Arranged: NA          Social Determinants of Health (SDOH) Interventions SDOH Screenings   Food Insecurity: No Food Insecurity (08/30/2022)  Housing: Low Risk  (12/26/2022)  Transportation Needs: No Transportation Needs (12/26/2022)  Utilities: Not At Risk (08/30/2022)  Financial Resource Strain: Low Risk  (12/26/2022)  Physical Activity: Inactive (12/26/2022)  Social Connections: Unknown (10/11/2021)   Received from [facility]  Tobacco Use: Low Risk  (01/08/2023)   Received from [facility]  Recent Concern: Tobacco Use - Medium Risk (12/26/2022)     Readmission Risk Interventions    09/02/2022    3:42 PM  Readmission Risk Prevention Plan  Transportation Screening Complete  PCP or Specialist Appt within 3-5 Days Complete  HRI or Home Care Consult Complete  Social Work Consult for Recovery Care Planning/Counseling Complete  Palliative Care Screening Not Applicable  Medication Review Oceanographer) Referral to Pharmacy

## 2023-01-12 NOTE — Progress Notes (Signed)
Mobility Specialist Progress Note:    01/12/23 1106  Mobility  Activity Ambulated with assistance in hallway  Level of Assistance Contact guard assist, steadying assist  Assistive Device None  Distance Ambulated (ft) 300 ft  Activity Response Tolerated well  Mobility Referral Yes  $Mobility charge 1 Mobility  Mobility Specialist Start Time (ACUTE ONLY) 0933  Mobility Specialist Stop Time (ACUTE ONLY) 0946  Mobility Specialist Time Calculation (min) (ACUTE ONLY) 13 min   Received pt in bed having no complaints and agreeable to mobility. C/o mild pain in his left hadn, otherwise no c/o. Returned to room w/o fault. Left in bed w/ call bell in reach and all needs met.   Thompson Grayer Mobility Specialist  Please contact vis Secure Chat or  Rehab Office [phone]

## 2023-01-14 DIAGNOSIS — E611 Iron deficiency: Secondary | ICD-10-CM | POA: Diagnosis not present

## 2023-01-14 DIAGNOSIS — R809 Proteinuria, unspecified: Secondary | ICD-10-CM | POA: Diagnosis not present

## 2023-01-14 DIAGNOSIS — N189 Chronic kidney disease, unspecified: Secondary | ICD-10-CM | POA: Diagnosis not present

## 2023-01-14 DIAGNOSIS — D631 Anemia in chronic kidney disease: Secondary | ICD-10-CM | POA: Diagnosis not present

## 2023-01-14 DIAGNOSIS — E211 Secondary hyperparathyroidism, not elsewhere classified: Secondary | ICD-10-CM | POA: Diagnosis not present

## 2023-01-15 DIAGNOSIS — I5022 Chronic systolic (congestive) heart failure: Secondary | ICD-10-CM | POA: Diagnosis not present

## 2023-01-15 DIAGNOSIS — Z299 Encounter for prophylactic measures, unspecified: Secondary | ICD-10-CM | POA: Diagnosis not present

## 2023-01-15 DIAGNOSIS — E261 Secondary hyperaldosteronism: Secondary | ICD-10-CM | POA: Diagnosis not present

## 2023-01-15 DIAGNOSIS — Z09 Encounter for follow-up examination after completed treatment for conditions other than malignant neoplasm: Secondary | ICD-10-CM | POA: Diagnosis not present

## 2023-01-15 DIAGNOSIS — I10 Essential (primary) hypertension: Secondary | ICD-10-CM | POA: Diagnosis not present

## 2023-01-19 DIAGNOSIS — D631 Anemia in chronic kidney disease: Secondary | ICD-10-CM | POA: Diagnosis not present

## 2023-01-19 DIAGNOSIS — N184 Chronic kidney disease, stage 4 (severe): Secondary | ICD-10-CM | POA: Diagnosis not present

## 2023-01-19 DIAGNOSIS — D531 Other megaloblastic anemias, not elsewhere classified: Secondary | ICD-10-CM | POA: Diagnosis not present

## 2023-01-19 DIAGNOSIS — I129 Hypertensive chronic kidney disease with stage 1 through stage 4 chronic kidney disease, or unspecified chronic kidney disease: Secondary | ICD-10-CM | POA: Diagnosis not present

## 2023-01-19 DIAGNOSIS — I5042 Chronic combined systolic (congestive) and diastolic (congestive) heart failure: Secondary | ICD-10-CM | POA: Diagnosis not present

## 2023-01-27 ENCOUNTER — Inpatient Hospital Stay: Payer: Medicare Other | Attending: Oncology | Admitting: Oncology

## 2023-01-27 ENCOUNTER — Inpatient Hospital Stay: Payer: Medicare Other

## 2023-01-27 ENCOUNTER — Encounter: Payer: Self-pay | Admitting: Oncology

## 2023-01-27 VITALS — BP 149/78 | HR 78 | Temp 96.5°F | Resp 18 | Ht 68.0 in | Wt 200.0 lb

## 2023-01-27 DIAGNOSIS — I129 Hypertensive chronic kidney disease with stage 1 through stage 4 chronic kidney disease, or unspecified chronic kidney disease: Secondary | ICD-10-CM | POA: Diagnosis not present

## 2023-01-27 DIAGNOSIS — N184 Chronic kidney disease, stage 4 (severe): Secondary | ICD-10-CM | POA: Diagnosis not present

## 2023-01-27 DIAGNOSIS — B188 Other chronic viral hepatitis: Secondary | ICD-10-CM

## 2023-01-27 DIAGNOSIS — D631 Anemia in chronic kidney disease: Secondary | ICD-10-CM

## 2023-01-27 DIAGNOSIS — N189 Chronic kidney disease, unspecified: Secondary | ICD-10-CM | POA: Diagnosis not present

## 2023-01-27 DIAGNOSIS — D696 Thrombocytopenia, unspecified: Secondary | ICD-10-CM | POA: Insufficient documentation

## 2023-01-27 LAB — IRON AND TIBC
Iron: 64 ug/dL (ref 45–182)
Saturation Ratios: 19 % (ref 17.9–39.5)
TIBC: 334 ug/dL (ref 250–450)
UIBC: 270 ug/dL

## 2023-01-27 LAB — RETICULOCYTES
Immature Retic Fract: 11.3 % (ref 2.3–15.9)
RBC.: 3.85 MIL/uL — ABNORMAL LOW (ref 4.22–5.81)
Retic Count, Absolute: 49.7 10*3/uL (ref 19.0–186.0)
Retic Ct Pct: 1.3 % (ref 0.4–3.1)

## 2023-01-27 LAB — COMPREHENSIVE METABOLIC PANEL
ALT: 14 U/L (ref 0–44)
AST: 14 U/L — ABNORMAL LOW (ref 15–41)
Albumin: 3.7 g/dL (ref 3.5–5.0)
Alkaline Phosphatase: 95 U/L (ref 38–126)
Anion gap: 12 (ref 5–15)
BUN: 65 mg/dL — ABNORMAL HIGH (ref 8–23)
CO2: 20 mmol/L — ABNORMAL LOW (ref 22–32)
Calcium: 9.1 mg/dL (ref 8.9–10.3)
Chloride: 106 mmol/L (ref 98–111)
Creatinine, Ser: 3.97 mg/dL — ABNORMAL HIGH (ref 0.61–1.24)
GFR, Estimated: 16 mL/min — ABNORMAL LOW (ref >60)
Glucose, Bld: 126 mg/dL — ABNORMAL HIGH (ref 70–99)
Potassium: 4.5 mmol/L (ref 3.5–5.1)
Sodium: 138 mmol/L (ref 135–145)
Total Bilirubin: 0.5 mg/dL (ref <1.2)
Total Protein: 6.9 g/dL (ref 6.5–8.1)

## 2023-01-27 LAB — CBC WITH DIFFERENTIAL/PLATELET
Abs Immature Granulocytes: 0.02 10*3/uL (ref 0.00–0.07)
Basophils Absolute: 0 10*3/uL (ref 0.0–0.1)
Basophils Relative: 0 %
Eosinophils Absolute: 0.3 10*3/uL (ref 0.0–0.5)
Eosinophils Relative: 6 %
HCT: 37.2 % — ABNORMAL LOW (ref 39.0–52.0)
Hemoglobin: 11.7 g/dL — ABNORMAL LOW (ref 13.0–17.0)
Immature Granulocytes: 0 %
Lymphocytes Relative: 26 %
Lymphs Abs: 1.5 10*3/uL (ref 0.7–4.0)
MCH: 30.2 pg (ref 26.0–34.0)
MCHC: 31.5 g/dL (ref 30.0–36.0)
MCV: 96.1 fL (ref 80.0–100.0)
Monocytes Absolute: 0.5 10*3/uL (ref 0.1–1.0)
Monocytes Relative: 9 %
Neutro Abs: 3.3 10*3/uL (ref 1.7–7.7)
Neutrophils Relative %: 59 %
Platelets: 119 10*3/uL — ABNORMAL LOW (ref 150–400)
RBC: 3.87 MIL/uL — ABNORMAL LOW (ref 4.22–5.81)
RDW: 12.8 % (ref 11.5–15.5)
WBC: 5.7 10*3/uL (ref 4.0–10.5)
nRBC: 0 % (ref 0.0–0.2)

## 2023-01-27 LAB — HEPATITIS PANEL, ACUTE
HCV Ab: NONREACTIVE
Hep A IgM: NONREACTIVE
Hep B C IgM: NONREACTIVE
Hepatitis B Surface Ag: NONREACTIVE

## 2023-01-27 LAB — FOLATE: Folate: 38.2 ng/mL (ref >5.9)

## 2023-01-27 LAB — VITAMIN B12: Vitamin B-12: 448 pg/mL (ref 180–914)

## 2023-01-27 LAB — FERRITIN: Ferritin: 124 ng/mL (ref 24–336)

## 2023-01-27 NOTE — Patient Instructions (Signed)
VISIT SUMMARY:  You were referred by Dr. [name] for erythropoietin injections due to your history of anemia. Your hemoglobin levels had improved, so you stopped the injections two months ago. Recently, you have been feeling sluggish, which you believe is due to low hemoglobin levels. Additionally, you have a history of low platelet counts, the cause of which is still unknown. You have been seeing Dr. [name] for fluctuating pre-acne levels but have no other complaints at this time.  YOUR PLAN:  -ANEMIA: Anemia is a condition where you don't have enough healthy red blood cells to carry adequate oxygen to your body's tissues. Your hemoglobin level was stable at 10.4 two weeks ago. We will order labs in two weeks to reassess your hemoglobin levels. If your hemoglobin is less than 10, you will receive an injection. If it is more than 11, we will withhold the injection.  -THROMBOCYTOPENIA: Thrombocytopenia is a condition characterized by low platelet counts, which can affect blood clotting. The cause of your low platelet count is still unknown. We will order blood work today, including liver viral tests, to investigate potential causes.  -GENERAL HEALTH MAINTENANCE: We will schedule you for injections and labs every two weeks and have a follow-up appointment in six weeks. If your hemoglobin remains over 10 for six months, we will change to monthly visits and injections.  INSTRUCTIONS:  Please follow up with the lab work in two weeks to reassess your hemoglobin levels. We will also conduct blood work today to investigate the cause of your low platelet count. Your next follow-up appointment is in six weeks. If your hemoglobin remains over 10 for six months, we will change to monthly visits and injections.

## 2023-01-27 NOTE — Assessment & Plan Note (Signed)
Chronic low platelet count, cause unknown. No known liver disease or alcohol use. Normal liver and spleen on previous MRI and CT abdomen. HIV negative. 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  -Order blood work today including hepatitis panel to investigate potential causes of thrombocytopenia. -No indication for transfusion or treatment at this time

## 2023-01-27 NOTE — Assessment & Plan Note (Signed)
Hemoglobin stable at 10.4 two weeks ago. Patient previously received EPO injections for anemia but stopped due to improvement in hemoglobin levels. -Order labs in today and every two weeks to reassess hemoglobin levels. -If hemoglobin is less than 10, will administer EPO.  -If Hb is stable and continues to stay above 10, we increase the lab checks to Q monthly and give EPO if needed.  RTC in 2 months

## 2023-01-27 NOTE — Progress Notes (Signed)
[facility] HEMATOLOGY NEW VISIT  [name], MD  REASON FOR REFERRAL: Anemia of chronic kidney disease  HISTORY OF PRESENT ILLNESS: Glenn Hawkins 67 y.o. male referred for anemia of chronic kidney disease. He was referred by Dr. [name] for erythropoietin injections. He was previously receiving these injections in [place], but stopped when his hemoglobin levels improved. The last injection was approximately two months ago. He reports feeling sluggish, which he attributes to his low hemoglobin levels. He has no other complaints.  The patient also has a history of consistently low platelet counts, the cause of which is unknown. He denies any liver problems or significant alcohol consumption. Platelet levels have been stable recently. No ecchymosis or bleeding reported.  I have reviewed the past medical history, past surgical history, social history and family history with the patient   ALLERGIES:  is allergic to nifedipine, aciphex [rabeprazole sodium], benazepril, haloperidol, and prilosec [omeprazole].  MEDICATIONS:  Current Outpatient Medications  Medication Sig Dispense Refill   acetaminophen (TYLENOL) 325 MG tablet Take 2 tablets (650 mg total) by mouth every 4 (four) hours as needed for fever, headache or mild pain. 20 tablet 0   amLODipine (NORVASC) 10 MG tablet Take 10 mg by mouth at bedtime.     apixaban (ELIQUIS) 5 MG TABS tablet Take 2.5 mg by mouth 2 (two) times daily.     atorvastatin (LIPITOR) 80 MG tablet Take 1 tablet (80 mg total) by mouth daily. (Patient taking differently: Take 80 mg by mouth in the morning.) 30 tablet 3   brimonidine (ALPHAGAN) 0.2 % ophthalmic solution Place 1 drop into both eyes daily.     calcitRIOL (ROCALTROL) 0.25 MCG capsule Take 0.25 mcg by mouth every Monday, Wednesday, and Friday.     carvedilol (COREG) 12.5 MG tablet Take 1 tablet (12.5 mg total) by mouth 2 (two) times daily. 60 tablet 11   colchicine 0.6  MG tablet Take 0.5 tablets (0.3 mg total) by mouth daily. (Patient taking differently: Take 0.3 mg by mouth 2 (two) times daily.) 30 tablet 3   Daridorexant HCl 25 MG TABS Take 25 mg by mouth at bedtime.     DOCUSATE SODIUM PO Take 1 capsule by mouth daily.     esomeprazole (NEXIUM) 20 MG capsule Take 1 capsule (20 mg total) by mouth daily before breakfast. 30 capsule 3   folic acid (FOLVITE) 1 MG tablet Take 1 tablet (1 mg total) by mouth daily. (Patient taking differently: Take 1 mg by mouth at bedtime.)     furosemide (LASIX) 40 MG tablet Take 1 tablet (40 mg total) by mouth daily as needed for edema or fluid. (Patient taking differently: Take 40 mg by mouth daily.) 90 tablet 2   isosorbide mononitrate (IMDUR) 60 MG 24 hr tablet Take 1 tablet (60 mg total) by mouth daily. 60 tablet 1   JARDIANCE 10 MG TABS tablet Take 10 mg by mouth daily.     melatonin 3 MG TABS tablet Take 3 mg by mouth at bedtime.     metoprolol succinate (TOPROL-XL) 100 MG 24 hr tablet Take 100 mg by mouth daily.     OLANZapine (ZYPREXA) 10 MG tablet Take 5-15 mg by mouth See admin instructions. Take 5 mg in the morning and 15 mg at night     potassium chloride SA (KLOR-CON M) 20 MEQ tablet Take 0.5 tablets (10 mEq total) by mouth daily. 30 tablet 0   sertraline (ZOLOFT) 100 MG  tablet Take 100 mg by mouth daily.     sodium bicarbonate 650 MG tablet Take 650 mg by mouth 2 (two) times daily.     spironolactone (ALDACTONE) 50 MG tablet Take 50 mg by mouth daily.     No current facility-administered medications for this visit.     REVIEW OF SYSTEMS:   Constitutional: Denies fevers, chills or night sweats Eyes: Denies blurriness of vision Ears, nose, mouth, throat, and face: Denies mucositis or sore throat Respiratory: Denies cough, dyspnea or wheezes Cardiovascular: Denies palpitation, chest discomfort or lower extremity swelling Gastrointestinal:  Denies nausea, heartburn or change in bowel habits Skin: Denies abnormal  skin rashes Lymphatics: Denies new lymphadenopathy or easy bruising Neurological:Denies numbness, tingling or new weaknesses Behavioral/Psych: Mood is stable, no new changes  All other systems were reviewed with the patient and are negative.  PHYSICAL EXAMINATION:   Vitals:   01/27/23 0944  BP: (!) 149/78  Pulse: 78  Resp: 18  Temp: (!) 96.5 F (35.8 C)  SpO2: 100%    GENERAL:alert, no distress and comfortable SKIN: skin color, texture, turgor are normal, no rashes or significant lesions LUNGS: clear to auscultation and percussion with normal breathing effort HEART: regular rate & rhythm and no murmurs and no lower extremity edema ABDOMEN:abdomen soft, non-tender and normal bowel sounds Musculoskeletal:no cyanosis of digits and no clubbing  NEURO: alert & oriented x 3 with fluent speech  LABORATORY DATA:  I have reviewed the data as listed and from his nephrologist under media  Labs from 12/03/2022 CMP: Creatinine: 2.97, GFR: 22, rest: WNL CBC: WBC: 7, hemoglobin: 10.2, MCV: 97.9, hematocrit: 32.3, platelets: 106  Lab Results  Component Value Date   WBC 5.7 01/27/2023   NEUTROABS 3.3 01/27/2023   HGB 11.7 (L) 01/27/2023   HCT 37.2 (L) 01/27/2023   MCV 96.1 01/27/2023   PLT 119 (L) 01/27/2023      Component Value Date/Time   NA 138 01/27/2023 1015   NA 145 (H) 07/14/2022 0915   K 4.5 01/27/2023 1015   CL 106 01/27/2023 1015   CO2 20 (L) 01/27/2023 1015   GLUCOSE 126 (H) 01/27/2023 1015   BUN 65 (H) 01/27/2023 1015   BUN 49 (H) 07/14/2022 0915   CREATININE 3.97 (H) 01/27/2023 1015   CREATININE 4.47 (H) 11/22/2020 1348   CALCIUM 9.1 01/27/2023 1015   PROT 6.9 01/27/2023 1015   PROT 6.3 07/14/2022 0915   ALBUMIN 3.7 01/27/2023 1015   ALBUMIN 3.8 (L) 07/14/2022 0915   AST 14 (L) 01/27/2023 1015   ALT 14 01/27/2023 1015   ALKPHOS 95 01/27/2023 1015   BILITOT 0.5 01/27/2023 1015   BILITOT 0.4 07/14/2022 0915   GFRNONAA 16 (L) 01/27/2023 1015   GFRAA 25 (L)  08/02/2019 1515       Chemistry      Component Value Date/Time   NA 138 01/27/2023 1015   NA 145 (H) 07/14/2022 0915   K 4.5 01/27/2023 1015   CL 106 01/27/2023 1015   CO2 20 (L) 01/27/2023 1015   BUN 65 (H) 01/27/2023 1015   BUN 49 (H) 07/14/2022 0915   CREATININE 3.97 (H) 01/27/2023 1015   CREATININE 4.47 (H) 11/22/2020 1348      Component Value Date/Time   CALCIUM 9.1 01/27/2023 1015   ALKPHOS 95 01/27/2023 1015   AST 14 (L) 01/27/2023 1015   ALT 14 01/27/2023 1015   BILITOT 0.5 01/27/2023 1015   BILITOT 0.4 07/14/2022 0915      ASSESSMENT &  PLAN:  Patient is a 67yo M with PMH of CKD and thrombocytopenia referred for anemia of chronic kidney disease and possible need for EPO shots.  Anemia in chronic kidney disease Hemoglobin stable at 10.4 two weeks ago. Patient previously received EPO injections for anemia but stopped due to improvement in hemoglobin levels. -Order labs in today and every two weeks to reassess hemoglobin levels. -If hemoglobin is less than 10, will administer EPO.  -If Hb is stable and continues to stay above 10, we increase the lab checks to Q monthly and give EPO if needed.  RTC in 2 months  Thrombocytopenia (HCC) Chronic low platelet count, cause unknown. No known liver disease or alcohol use. Normal liver and spleen on previous MRI and CT abdomen. HIV negative. 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  -Order blood work today including hepatitis panel to investigate potential causes of thrombocytopenia. -No indication for transfusion or treatment at this time   Orders Placed This Encounter  Procedures   CBC with Differential/Platelet    Standing Status:   Future    Number of Occurrences:   1    Standing Expiration Date:   01/27/2024   Comprehensive metabolic panel    Standing Status:   Future    Number of Occurrences:   1    Standing Expiration Date:   01/27/2024   Ferritin    Standing Status:   Future     Number of Occurrences:   1    Standing Expiration Date:   01/27/2024   Folate    Standing Status:   Future    Number of Occurrences:   1    Standing Expiration Date:   01/27/2024   Vitamin B12    Standing Status:   Future    Number of Occurrences:   1    Standing Expiration Date:   01/27/2024   Reticulocytes    Standing Status:   Future    Number of Occurrences:   1    Standing Expiration Date:   01/27/2024   Hepatitis panel, acute    Standing Status:   Future    Number of Occurrences:   1    Standing Expiration Date:   01/27/2024    Order Specific Question:   Release to patient    Answer:   Immediate [1]   Iron and TIBC    Standing Status:   Future    Number of Occurrences:   1    Standing Expiration Date:   01/27/2024    Order Specific Question:   Release to patient    Answer:   Immediate [1]    The total time spent in the appointment was 45 minutes encounter with patients including review of chart and various tests results, discussions about plan of care and coordination of care plan   All questions were answered. The patient knows to call the clinic with any problems, questions or concerns. No barriers to learning was detected.   Cindie Crumbly, MD 12/3/202410:12 PM

## 2023-02-04 ENCOUNTER — Ambulatory Visit: Payer: Self-pay | Admitting: *Deleted

## 2023-02-04 ENCOUNTER — Encounter: Payer: Self-pay | Admitting: *Deleted

## 2023-02-04 NOTE — Patient Outreach (Signed)
Care Coordination   Follow Up Visit Note   02/04/2023 Name: Glenn Hawkins MRN: [id number] DOB: 04/18/1955  Glenn Hawkins is a 67 y.o. year old male who sees Vyas, Dhruv B, MD for primary care. I spoke with  Glenn Hawkins by phone today.  What matters to the patients health and wellness today?  Managing anemia and CHF.    Goals Addressed             This Visit's Progress    Manage CHF and Monitor for Edema       Care Coordination Goals: Patient will notify provider of any new or worsening symptoms Patient will keep appointment with cardiologist on 02/24/23 Patient will follow-up with nephrologist in 2 months as planned Patient will take medications as prescribed Patient will check and record blood pressure daily and reach out to provider with any readings outside of recommended range Patient will check and record weight each morning after urinating and will call cardiologist with weight gain of >2 lbs overnight of >5 lbs in one week Patient will have daily weights and daily blood pressure readings available to discuss with RN at next scheduled telephone appointment Patient will monitor for any swelling in lower extremities, hands, & abdomen and call provider if any are noted Patient will call RN Care Coordinator [phone] with any resource or care coordination needs         SDOH assessments and interventions completed:  Yes  SDOH Interventions Today    Flowsheet Row Most Recent Value  SDOH Interventions   Transportation Interventions Patient Resources (Friends/Family)  Financial Strain Interventions Intervention Not Indicated        Care Coordination Interventions:  Yes, provided  Interventions Today    Flowsheet Row Most Recent Value  Chronic Disease   Chronic disease during today's visit Congestive Heart Failure (CHF)  General Interventions   General Interventions Discussed/Reviewed General Interventions Discussed, General Interventions Reviewed, Labs,  Durable Medical Equipment (DME), Communication with, Doctor Visits  Labs Kidney Function  [Liver functions, CBC]  Doctor Visits Discussed/Reviewed Doctor Visits Discussed, Doctor Visits Reviewed, Annual Wellness Visits, PCP, Specialist  [reviewed and discussed hospitalization in 12/2022 for CHF. Discussed visit with Dr Sherril Croon, Dr [name], and hematologist since hospital discharge 3 weeks ago.]  Durable Medical Equipment (DME) BP Cuff, Other  [Scales. Reports weight is stable at 191 lbs. Did not have home blood pressure reading available. Reviewed BP from last office visit with hematologist.]  PCP/Specialist Visits Compliance with follow-up visit  [follow-up with nephrologist every 2 months as planned. Keep appointment with cardiologist on 02/24/23. Schedule F/U cardiology appointment before leaving the office on the 31st. erythropoietin injections and labs scheduled at [facility] on 02/10/23.]  Communication with PCP/Specialists  [conference call with patient and ardiology office,Dr Kirke Corin [phone] to schedule a hospital F/U Appt. He was on the recall list for January 2025 but did not have a scheduled appointment. Now scheduled for 02/24/23 at [facility] office.]  Exercise Interventions   Exercise Discussed/Reviewed Physical Activity  Physical Activity Discussed/Reviewed Physical Activity Discussed, Physical Activity Reviewed  [able to perform ADLs. Encouraged to increase physical activity as tolerated with a goal of 150 minutes per week.]  Education Interventions   Education Provided Provided Education  Provided Verbal Education On Nutrition, Labs, Medication, Exercise, When to see the doctor  Labs Reviewed Kidney Function  [reviewed CBC and CMP with patient. Hgb and platelets have improved some. Liver and kidney functions are stable.]  Nutrition Interventions   Nutrition  Discussed/Reviewed Nutrition Reviewed, Nutrition Discussed, Fluid intake, Decreasing salt  Pharmacy Interventions   Pharmacy  Dicussed/Reviewed Pharmacy Topics Discussed, Pharmacy Topics Reviewed, Medications and their functions  [Taking medications as prescribed. Medication changes at hospital discharge: Added norvasc 10 mg daily, imdur 60 mg daily,  changed metoprolol 100 mg daily to coreg 12.5 mg BID.]  Safety Interventions   Safety Discussed/Reviewed Safety Discussed, Safety Reviewed       Follow up plan: Follow up call scheduled for 03/04/23    Encounter Outcome:  Patient Visit Completed   Demetrios Loll, RN, BSN Care Manager [place]  [facility]  Population Health  Direct Dial: [phone] Main #: [phone]

## 2023-02-09 ENCOUNTER — Other Ambulatory Visit: Payer: Self-pay

## 2023-02-09 DIAGNOSIS — D631 Anemia in chronic kidney disease: Secondary | ICD-10-CM

## 2023-02-10 ENCOUNTER — Inpatient Hospital Stay: Payer: Medicare Other

## 2023-02-10 DIAGNOSIS — Z1331 Encounter for screening for depression: Secondary | ICD-10-CM | POA: Diagnosis not present

## 2023-02-10 DIAGNOSIS — Z79899 Other long term (current) drug therapy: Secondary | ICD-10-CM | POA: Diagnosis not present

## 2023-02-10 DIAGNOSIS — D631 Anemia in chronic kidney disease: Secondary | ICD-10-CM

## 2023-02-10 DIAGNOSIS — Z299 Encounter for prophylactic measures, unspecified: Secondary | ICD-10-CM | POA: Diagnosis not present

## 2023-02-10 DIAGNOSIS — Z125 Encounter for screening for malignant neoplasm of prostate: Secondary | ICD-10-CM | POA: Diagnosis not present

## 2023-02-10 DIAGNOSIS — D696 Thrombocytopenia, unspecified: Secondary | ICD-10-CM | POA: Diagnosis not present

## 2023-02-10 DIAGNOSIS — Z0000 Encounter for general adult medical examination without abnormal findings: Secondary | ICD-10-CM | POA: Diagnosis not present

## 2023-02-10 DIAGNOSIS — N184 Chronic kidney disease, stage 4 (severe): Secondary | ICD-10-CM | POA: Diagnosis not present

## 2023-02-10 DIAGNOSIS — I10 Essential (primary) hypertension: Secondary | ICD-10-CM | POA: Diagnosis not present

## 2023-02-10 DIAGNOSIS — E7800 Pure hypercholesterolemia, unspecified: Secondary | ICD-10-CM | POA: Diagnosis not present

## 2023-02-10 DIAGNOSIS — I129 Hypertensive chronic kidney disease with stage 1 through stage 4 chronic kidney disease, or unspecified chronic kidney disease: Secondary | ICD-10-CM | POA: Diagnosis not present

## 2023-02-10 DIAGNOSIS — R5383 Other fatigue: Secondary | ICD-10-CM | POA: Diagnosis not present

## 2023-02-10 DIAGNOSIS — Z7189 Other specified counseling: Secondary | ICD-10-CM | POA: Diagnosis not present

## 2023-02-10 DIAGNOSIS — Z1339 Encounter for screening examination for other mental health and behavioral disorders: Secondary | ICD-10-CM | POA: Diagnosis not present

## 2023-02-10 LAB — CBC WITH DIFFERENTIAL/PLATELET
Abs Immature Granulocytes: 0.04 10*3/uL (ref 0.00–0.07)
Basophils Absolute: 0 10*3/uL (ref 0.0–0.1)
Basophils Relative: 0 %
Eosinophils Absolute: 0.3 10*3/uL (ref 0.0–0.5)
Eosinophils Relative: 4 %
HCT: 34.6 % — ABNORMAL LOW (ref 39.0–52.0)
Hemoglobin: 10.7 g/dL — ABNORMAL LOW (ref 13.0–17.0)
Immature Granulocytes: 1 %
Lymphocytes Relative: 17 %
Lymphs Abs: 1.2 10*3/uL (ref 0.7–4.0)
MCH: 30.2 pg (ref 26.0–34.0)
MCHC: 30.9 g/dL (ref 30.0–36.0)
MCV: 97.7 fL (ref 80.0–100.0)
Monocytes Absolute: 0.5 10*3/uL (ref 0.1–1.0)
Monocytes Relative: 7 %
Neutro Abs: 5.1 10*3/uL (ref 1.7–7.7)
Neutrophils Relative %: 71 %
Platelets: 91 10*3/uL — ABNORMAL LOW (ref 150–400)
RBC: 3.54 MIL/uL — ABNORMAL LOW (ref 4.22–5.81)
RDW: 13.2 % (ref 11.5–15.5)
WBC: 7.2 10*3/uL (ref 4.0–10.5)
nRBC: 0 % (ref 0.0–0.2)

## 2023-02-10 NOTE — Progress Notes (Signed)
Patient's hemoglobin noted to be 10.7 today. No injection needed today per provider's order. Patient made aware and verbalized understanding.  Discharged from clinic ambulatory with cane in stable condition. Alert and oriented x 3. F/U with [facility] as scheduled.

## 2023-02-12 DIAGNOSIS — I739 Peripheral vascular disease, unspecified: Secondary | ICD-10-CM | POA: Diagnosis not present

## 2023-02-12 DIAGNOSIS — M79675 Pain in left toe(s): Secondary | ICD-10-CM | POA: Diagnosis not present

## 2023-02-12 DIAGNOSIS — M79672 Pain in left foot: Secondary | ICD-10-CM | POA: Diagnosis not present

## 2023-02-12 DIAGNOSIS — M79674 Pain in right toe(s): Secondary | ICD-10-CM | POA: Diagnosis not present

## 2023-02-12 DIAGNOSIS — L110 Acquired keratosis follicularis: Secondary | ICD-10-CM | POA: Diagnosis not present

## 2023-02-12 DIAGNOSIS — L609 Nail disorder, unspecified: Secondary | ICD-10-CM | POA: Diagnosis not present

## 2023-02-12 DIAGNOSIS — M79671 Pain in right foot: Secondary | ICD-10-CM | POA: Diagnosis not present

## 2023-02-15 ENCOUNTER — Other Ambulatory Visit (HOSPITAL_COMMUNITY): Payer: Self-pay | Admitting: Family Medicine

## 2023-02-19 ENCOUNTER — Telehealth: Payer: Self-pay | Admitting: Cardiovascular Disease

## 2023-02-19 NOTE — Telephone Encounter (Signed)
RN Forde Radon is requesting a call back from our nurse in regards to setting the patient up with a weight management program. Please advise.

## 2023-02-20 ENCOUNTER — Other Ambulatory Visit: Payer: Self-pay

## 2023-02-20 DIAGNOSIS — N1832 Chronic kidney disease, stage 3b: Secondary | ICD-10-CM

## 2023-02-20 DIAGNOSIS — I252 Old myocardial infarction: Secondary | ICD-10-CM | POA: Diagnosis not present

## 2023-02-20 DIAGNOSIS — I509 Heart failure, unspecified: Secondary | ICD-10-CM | POA: Diagnosis not present

## 2023-02-20 DIAGNOSIS — Z79899 Other long term (current) drug therapy: Secondary | ICD-10-CM | POA: Diagnosis not present

## 2023-02-20 DIAGNOSIS — R109 Unspecified abdominal pain: Secondary | ICD-10-CM | POA: Diagnosis not present

## 2023-02-20 DIAGNOSIS — Z888 Allergy status to other drugs, medicaments and biological substances status: Secondary | ICD-10-CM | POA: Diagnosis not present

## 2023-02-20 DIAGNOSIS — I130 Hypertensive heart and chronic kidney disease with heart failure and stage 1 through stage 4 chronic kidney disease, or unspecified chronic kidney disease: Secondary | ICD-10-CM | POA: Diagnosis not present

## 2023-02-20 DIAGNOSIS — K6389 Other specified diseases of intestine: Secondary | ICD-10-CM | POA: Diagnosis not present

## 2023-02-20 DIAGNOSIS — Z7982 Long term (current) use of aspirin: Secondary | ICD-10-CM | POA: Diagnosis not present

## 2023-02-20 DIAGNOSIS — Z7901 Long term (current) use of anticoagulants: Secondary | ICD-10-CM | POA: Diagnosis not present

## 2023-02-20 DIAGNOSIS — R188 Other ascites: Secondary | ICD-10-CM | POA: Diagnosis not present

## 2023-02-20 DIAGNOSIS — N200 Calculus of kidney: Secondary | ICD-10-CM | POA: Diagnosis not present

## 2023-02-20 DIAGNOSIS — K469 Unspecified abdominal hernia without obstruction or gangrene: Secondary | ICD-10-CM | POA: Diagnosis not present

## 2023-02-20 DIAGNOSIS — I77811 Abdominal aortic ectasia: Secondary | ICD-10-CM | POA: Diagnosis not present

## 2023-02-20 DIAGNOSIS — N1330 Unspecified hydronephrosis: Secondary | ICD-10-CM | POA: Diagnosis not present

## 2023-02-20 DIAGNOSIS — F200 Paranoid schizophrenia: Secondary | ICD-10-CM | POA: Diagnosis not present

## 2023-02-20 DIAGNOSIS — R7989 Other specified abnormal findings of blood chemistry: Secondary | ICD-10-CM | POA: Diagnosis not present

## 2023-02-20 DIAGNOSIS — I700 Atherosclerosis of aorta: Secondary | ICD-10-CM | POA: Diagnosis not present

## 2023-02-20 DIAGNOSIS — K4090 Unilateral inguinal hernia, without obstruction or gangrene, not specified as recurrent: Secondary | ICD-10-CM | POA: Diagnosis not present

## 2023-02-20 DIAGNOSIS — N189 Chronic kidney disease, unspecified: Secondary | ICD-10-CM | POA: Diagnosis not present

## 2023-02-20 NOTE — Telephone Encounter (Signed)
Called and spoke to [place] states:   -she is patients MH nurse   -patient has been hospitalized a lot over the past 6 months   -hospitalization is related to fluid/CHF   -would like to know if office offers TH program to monitor CHF/have patient check weight Informed Carletta:  -at this time program not offered   -patient might need home visiting nurse/home health nurse referral   -would outreach PCP office for assistance  Carletta agrees with plan, no questions or concerns at this time

## 2023-02-20 NOTE — Progress Notes (Signed)
CBC ordered for injection appointment per Cindie Crumbly, MD orders.

## 2023-02-20 NOTE — Telephone Encounter (Signed)
Forde Radon, RN returning call

## 2023-02-20 NOTE — Telephone Encounter (Signed)
Left message for nurse to return the call.

## 2023-02-23 ENCOUNTER — Inpatient Hospital Stay: Payer: Medicare Other

## 2023-02-23 ENCOUNTER — Inpatient Hospital Stay: Payer: Medicare Other | Admitting: Oncology

## 2023-02-23 DIAGNOSIS — D631 Anemia in chronic kidney disease: Secondary | ICD-10-CM | POA: Diagnosis not present

## 2023-02-23 DIAGNOSIS — I129 Hypertensive chronic kidney disease with stage 1 through stage 4 chronic kidney disease, or unspecified chronic kidney disease: Secondary | ICD-10-CM | POA: Diagnosis not present

## 2023-02-23 DIAGNOSIS — N184 Chronic kidney disease, stage 4 (severe): Secondary | ICD-10-CM | POA: Diagnosis not present

## 2023-02-23 DIAGNOSIS — N1832 Chronic kidney disease, stage 3b: Secondary | ICD-10-CM

## 2023-02-23 DIAGNOSIS — D696 Thrombocytopenia, unspecified: Secondary | ICD-10-CM | POA: Diagnosis not present

## 2023-02-23 LAB — CBC
HCT: 35 % — ABNORMAL LOW (ref 39.0–52.0)
Hemoglobin: 11.5 g/dL — ABNORMAL LOW (ref 13.0–17.0)
MCH: 31.4 pg (ref 26.0–34.0)
MCHC: 32.9 g/dL (ref 30.0–36.0)
MCV: 95.6 fL (ref 80.0–100.0)
Platelets: 96 10*3/uL — ABNORMAL LOW (ref 150–400)
RBC: 3.66 MIL/uL — ABNORMAL LOW (ref 4.22–5.81)
RDW: 13.3 % (ref 11.5–15.5)
WBC: 7.1 10*3/uL (ref 4.0–10.5)
nRBC: 0 % (ref 0.0–0.2)

## 2023-02-23 NOTE — Progress Notes (Signed)
HGB 11.5. No EPO administered per Dr. Marene Lenz orders.

## 2023-02-23 NOTE — Progress Notes (Unsigned)
HGB 11.5. Per Dr. Marene Lenz orders patient will not receive EPO unless HGB <10.

## 2023-02-24 ENCOUNTER — Ambulatory Visit: Payer: Medicare Other | Admitting: Physician Assistant

## 2023-02-24 ENCOUNTER — Inpatient Hospital Stay: Payer: Medicare Other

## 2023-02-27 ENCOUNTER — Ambulatory Visit: Payer: Medicare Other | Admitting: Physician Assistant

## 2023-03-02 DIAGNOSIS — Z299 Encounter for prophylactic measures, unspecified: Secondary | ICD-10-CM | POA: Diagnosis not present

## 2023-03-02 DIAGNOSIS — F200 Paranoid schizophrenia: Secondary | ICD-10-CM | POA: Diagnosis not present

## 2023-03-02 DIAGNOSIS — I5022 Chronic systolic (congestive) heart failure: Secondary | ICD-10-CM | POA: Diagnosis not present

## 2023-03-02 DIAGNOSIS — I739 Peripheral vascular disease, unspecified: Secondary | ICD-10-CM | POA: Diagnosis not present

## 2023-03-02 DIAGNOSIS — N184 Chronic kidney disease, stage 4 (severe): Secondary | ICD-10-CM | POA: Diagnosis not present

## 2023-03-02 DIAGNOSIS — I10 Essential (primary) hypertension: Secondary | ICD-10-CM | POA: Diagnosis not present

## 2023-03-02 DIAGNOSIS — R109 Unspecified abdominal pain: Secondary | ICD-10-CM | POA: Diagnosis not present

## 2023-03-04 ENCOUNTER — Ambulatory Visit: Payer: Self-pay | Admitting: *Deleted

## 2023-03-04 NOTE — Patient Outreach (Signed)
  Care Coordination   03/04/2023 Name: Glenn Hawkins MRN: [id number] DOB: 03-Oct-1955   Care Coordination Outreach Attempts:  An unsuccessful outreach was attempted for an appointment today.  Follow Up Plan:  Additional outreach attempts will be made to offer the patient complex care management information and services.   Encounter Outcome:  No Answer. Left HIPAA compliant VM.   Care Coordination Interventions:  No, not indicated    Josette Pellet, RN, BSN Care Manager [place]  [facility]  Population Health  Direct Dial: [phone] Main #: [phone]

## 2023-03-10 DIAGNOSIS — D631 Anemia in chronic kidney disease: Secondary | ICD-10-CM | POA: Diagnosis not present

## 2023-03-10 DIAGNOSIS — R809 Proteinuria, unspecified: Secondary | ICD-10-CM | POA: Diagnosis not present

## 2023-03-10 DIAGNOSIS — N189 Chronic kidney disease, unspecified: Secondary | ICD-10-CM | POA: Diagnosis not present

## 2023-03-12 ENCOUNTER — Inpatient Hospital Stay: Payer: Medicare Other | Admitting: Oncology

## 2023-03-12 ENCOUNTER — Inpatient Hospital Stay: Payer: Medicare Other

## 2023-03-16 ENCOUNTER — Inpatient Hospital Stay: Payer: Medicare Other | Attending: Hematology

## 2023-03-16 ENCOUNTER — Inpatient Hospital Stay: Payer: Medicare Other

## 2023-03-16 ENCOUNTER — Inpatient Hospital Stay: Payer: Medicare Other | Admitting: Oncology

## 2023-03-16 DIAGNOSIS — I129 Hypertensive chronic kidney disease with stage 1 through stage 4 chronic kidney disease, or unspecified chronic kidney disease: Secondary | ICD-10-CM | POA: Insufficient documentation

## 2023-03-16 DIAGNOSIS — D631 Anemia in chronic kidney disease: Secondary | ICD-10-CM | POA: Diagnosis not present

## 2023-03-16 DIAGNOSIS — N184 Chronic kidney disease, stage 4 (severe): Secondary | ICD-10-CM | POA: Insufficient documentation

## 2023-03-16 LAB — COMPREHENSIVE METABOLIC PANEL
ALT: 11 U/L (ref 0–44)
AST: 15 U/L (ref 15–41)
Albumin: 3.8 g/dL (ref 3.5–5.0)
Alkaline Phosphatase: 100 U/L (ref 38–126)
Anion gap: 11 (ref 5–15)
BUN: 54 mg/dL — ABNORMAL HIGH (ref 8–23)
CO2: 20 mmol/L — ABNORMAL LOW (ref 22–32)
Calcium: 9 mg/dL (ref 8.9–10.3)
Chloride: 106 mmol/L (ref 98–111)
Creatinine, Ser: 3.89 mg/dL — ABNORMAL HIGH (ref 0.61–1.24)
GFR, Estimated: 16 mL/min — ABNORMAL LOW (ref >60)
Glucose, Bld: 157 mg/dL — ABNORMAL HIGH (ref 70–99)
Potassium: 4.3 mmol/L (ref 3.5–5.1)
Sodium: 137 mmol/L (ref 135–145)
Total Bilirubin: 0.6 mg/dL (ref 0.0–1.2)
Total Protein: 6.9 g/dL (ref 6.5–8.1)

## 2023-03-16 LAB — CBC WITH DIFFERENTIAL/PLATELET
Abs Immature Granulocytes: 0.02 10*3/uL (ref 0.00–0.07)
Basophils Absolute: 0 10*3/uL (ref 0.0–0.1)
Basophils Relative: 1 %
Eosinophils Absolute: 0.4 10*3/uL (ref 0.0–0.5)
Eosinophils Relative: 7 %
HCT: 35.3 % — ABNORMAL LOW (ref 39.0–52.0)
Hemoglobin: 11.2 g/dL — ABNORMAL LOW (ref 13.0–17.0)
Immature Granulocytes: 0 %
Lymphocytes Relative: 20 %
Lymphs Abs: 1 10*3/uL (ref 0.7–4.0)
MCH: 30 pg (ref 26.0–34.0)
MCHC: 31.7 g/dL (ref 30.0–36.0)
MCV: 94.6 fL (ref 80.0–100.0)
Monocytes Absolute: 0.4 10*3/uL (ref 0.1–1.0)
Monocytes Relative: 7 %
Neutro Abs: 3.5 10*3/uL (ref 1.7–7.7)
Neutrophils Relative %: 65 %
Platelets: 112 10*3/uL — ABNORMAL LOW (ref 150–400)
RBC: 3.73 MIL/uL — ABNORMAL LOW (ref 4.22–5.81)
RDW: 12.8 % (ref 11.5–15.5)
WBC: 5.3 10*3/uL (ref 4.0–10.5)
nRBC: 0 % (ref 0.0–0.2)

## 2023-03-16 LAB — IRON AND TIBC
Iron: 60 ug/dL (ref 45–182)
Saturation Ratios: 20 % (ref 17.9–39.5)
TIBC: 302 ug/dL (ref 250–450)
UIBC: 242 ug/dL

## 2023-03-16 LAB — FERRITIN: Ferritin: 177 ng/mL (ref 24–336)

## 2023-03-16 NOTE — Progress Notes (Signed)
Patient had a Hgb of 11.2 today. No Retacrit per order parameters.

## 2023-03-17 DIAGNOSIS — I129 Hypertensive chronic kidney disease with stage 1 through stage 4 chronic kidney disease, or unspecified chronic kidney disease: Secondary | ICD-10-CM | POA: Diagnosis not present

## 2023-03-17 DIAGNOSIS — N2581 Secondary hyperparathyroidism of renal origin: Secondary | ICD-10-CM | POA: Diagnosis not present

## 2023-03-17 DIAGNOSIS — R809 Proteinuria, unspecified: Secondary | ICD-10-CM | POA: Diagnosis not present

## 2023-03-17 DIAGNOSIS — I5042 Chronic combined systolic (congestive) and diastolic (congestive) heart failure: Secondary | ICD-10-CM | POA: Diagnosis not present

## 2023-03-18 NOTE — Progress Notes (Signed)
Cardiology Office Note    Date:  03/20/2023  ID:  Glenn Hawkins, DOB [birth year], MRN [id number] PCP:  [name], MD  Cardiologist:  [name], MD  Electrophysiologist:  None   Chief Complaint: Follow up for CHF   History of Present Illness: .    Glenn Hawkins is a 68 y.o. male with visit-pertinent history of CAD, stage IV CKD, history of DVT in August 2016, PAD, chronic combined systolic and diastolic heart failure, paranoid schizophrenia, hypertension and hyperlipidemia.  In January 2013 he had a late presenting MI, he opted for cardiac catheterization due to late presentation and CKD with creatinine 2.5 at the time.  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  He had extensive right lower extremity DVT in August 2016 and has been on anticoagulation since.  Echo in July 2021 showed EF 30 to 35% with akinesis of the inferior lateral wall, mild MR and mild to moderate AI.  CT of the abdomen at the [facility] showed incidental finding of significant distal abdominal aortic narrowing extending into bilateral common iliac artery.  His renal function was followed by Dr. [name].  In 04/2022 he was hospitalized at [facility] due to multifocal pneumonia.  He was close to needing to be intubated however improved with CPAP therapy and antibiotic.  He did not require oxygen on discharge.  In 04/2022 he presented to [name], ED for worsening orthopnea.  On arrival creatinine was 3.25P was 1699.  Echocardiogram indicated assistance systolic congestive heart failure, LVEF at 35 to 40% with regional wall motion abnormalities, there was mild RV dysfunction, severe LAE, mild to moderate AI and a small localized pericardial effusion.  There was severe MR with posterior leaflet tethering.  TEE in 4/24 indicated EF 35 to 40%, RV mildly reduced, severe ischemic MR on 07/03/2022 he  underwent right and left heart catheterization which indicated patent left main stem and LAD without significant stenosis, subtotal occlusion of the mid circumflex and the mid RCA, RCA collateralized from a septal perforating cascade of the LAD he had severely elevated LVEDP of 29 mmHg.  Patient was admitted for IV diuresis, his hospital course was complicated by AKI with peak creatinine of 4.1.  Patient was discharged on metoprolol, hydralazine and Imdur as he was unable to afford BiDil.  On 07/16/2022 he was planned to undergo TEE ER with Dr. [name], at start of case however TEE showed no more than 1+ mitral regurgitation, LVEF had improved to 40 to 45%.  As such his procedure was canceled and he was discharged later that day.  He was seen in advanced heart failure clinic on 07/29/2022 he reported overall that he was feeling well.  He noted mild shortness of breath with weed eating and had shortness of breath when walking further distances on flat ground.  Patient was then started on Jardiance.  It is recommended that he have follow-up with heart failure clinic APP in 1 month and with Dr. [name] in 3 months.  In 08/2022 he was hospitalized at [facility] after presenting with fatigue, anorexia and diarrhea.  He was noted to have acute on chronic kidney disease with creatinine of 9.7 and BUN of 114.  Jardiance and spironolactone were discontinued.  He resumed Lasix 40 mg twice daily.  He was seen by Dr. Kirke Corin in follow-up on 09/16/2022 he reported that he was  doing well overall is recommended that patient avoid nephrotoxic medications.  He was resumed on spironolactone, instructed to avoid an SGLT2 inhibitor.  He resumed hydralazine 25 mg 3 times daily.  On chart review in 10/2022 patient was admitted to [facility] for AKI.  Found to have creatinine of 6 with increased fatigue.  He was given fluid resuscitation with improvement in symptoms.  In November patient presented to [facility] for increased  shortness of breath, patient was transferred to [facility] for pulmonary edema.  Patient was given IV diuresis weaned off of oxygen.  Today patient presents for follow-up.  He reports that he is doing well. He denies any chest pain or shortness of breath, current lower extremity edema.  He reports that he has been closely followed by his nephrologist, he notes that he has had medication changes however unfortunately he does not have an updated list.  He reports that he has been followed by hematology/oncology for his anemia.  He now has a home health nurse that comes out a few times a week to assist him with his medications. He denies any cardiac concerns or complaints today.  Labwork independently reviewed: 03/16/2023: Sodium 137, potassium 4.3, creatinine 3.89, EGFR 16, hemoglobin 11.2, hematocrit 35.3 ROS: .   Today he denies chest pain, shortness of breath, lower extremity edema, fatigue, palpitations, melena, hematuria, hemoptysis, diaphoresis, weakness, presyncope, syncope, orthopnea, and PND.  All other systems are reviewed and otherwise negative. Studies Reviewed: [name]    EKG:  EKG is not used today.   CV Studies:  Cardiac Studies & Procedures   CARDIAC CATHETERIZATION  CARDIAC CATHETERIZATION 07/16/2022  Narrative Cancelled TEER with MitraClip due to marked reduction in baseline MR to 1+ on TEE imaging at the time of today's scheduled procedure.   CARDIAC CATHETERIZATION  CARDIAC CATHETERIZATION 07/03/2022  Narrative 1.  Patent left main stem and LAD without significant stenosis 2.  Subtotal occlusion of the mid circumflex and the mid RCA, RCA collateralized from a septal perforating cascade of the LAD 3.  Severely elevated LVEDP of 29 mmHg 4.  Elevated right heart pressures: RA mean 20 mmHg PA 74/39 mean 50 mmHg Wedge pressure mean 30, V wave 40 mmHg Cardiac output 6.57 L/min, cardiac index 3.21 Transpulmonic gradient 20 mmHg, PVR 3 Wood units  Recommend: Hospital admission  for IV diuresis, heart failure consultation, consider transcatheter edge-to-edge repair of the mitral valve following optimization of medical therapy. Medical therapy for CAD.  Findings Coronary Findings Diagnostic  Dominance: Right  Left Main The vessel exhibits minimal luminal irregularities. The left main is patent with no significant stenosis  Left Anterior Descending There is mild diffuse disease throughout the vessel. Patent vessel to the apex with mild nonobstructive plaquing  Left Circumflex Prox Cx to Mid Cx lesion is 95% stenosed. The lesion is calcified. Small to moderate caliber vessel with subtotal mid vessel occlusion, late filling of 2 marginal branches noted.  Right Coronary Artery Mid RCA lesion is 99% stenosed. Dominant vessel, subtotal occlusion with late filling of the distal RCA, also collateralized from left to right collaterals  Right Posterior Descending Artery Collaterals RPDA filled by collaterals from 1st Sept.  Collaterals RPDA filled by collaterals from 2nd Sept.  Intervention  No interventions have been documented.    ECHOCARDIOGRAM  ECHOCARDIOGRAM COMPLETE 05/07/2022  Narrative ECHOCARDIOGRAM REPORT    Patient Name:   NIHAAL FRIESEN Date of Exam: 05/07/2022 Medical Rec #:  [id number]      Height:  68.0 in Accession #:    [id number]     Weight:       176.8 lb Date of Birth:  15-Dec-1955       BSA:          1.939 m Patient Age:    66 years       BP:           154/89 mmHg Patient Gender: M              HR:           89 bpm. Exam Location:  Inpatient  Procedure: 2D Echo, Color Doppler and Cardiac Doppler  Indications:    I50.21 Acute systolic (congestive) heart failure  History:        Patient has prior history of Echocardiogram examinations, most recent 09/15/2019. CHF, PAD; Risk Factors:Hypertension and Dyslipidemia.  Sonographer:    Irving Burton Senior RDCS Referring Phys: [phone] RONDELL A SMITH  IMPRESSIONS   1. Left ventricular  ejection fraction, by estimation, is 35 to 40%. The left ventricle has moderately decreased function. The left ventricle demonstrates regional wall motion abnormalities (see scoring diagram/findings for description). The left ventricular internal cavity size was moderately to severely dilated. Left ventricular diastolic parameters are indeterminate. 2. Right ventricular systolic function is mildly reduced. The right ventricular size is normal. There is mildly elevated pulmonary artery systolic pressure. The estimated right ventricular systolic pressure is 41.2 mmHg. 3. Left atrial size was severely dilated. 4. A small pericardial effusion is present. The pericardial effusion is localized near the right atrium. 5. 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 There is splay artifact. The mitral valve is grossly normal. Severe mitral valve regurgitation. No evidence of mitral stenosis. 6. The aortic valve is tricuspid. There is mild calcification of the aortic valve. Aortic valve regurgitation is mild to moderate. No aortic stenosis is present. 7. The inferior vena cava is dilated in size with >50% respiratory variability, suggesting right atrial pressure of 8 mmHg.  FINDINGS Left Ventricle: Left ventricular ejection fraction, by estimation, is 35 to 40%. The left ventricle has moderately decreased function. The left ventricle demonstrates regional wall motion abnormalities. The left ventricular internal cavity size was moderately to severely dilated. There is no left ventricular hypertrophy. Left ventricular diastolic function could not be evaluated due to mitral regurgitation (moderate or greater). Left ventricular diastolic parameters are indeterminate.   LV Wall Scoring: The mid and distal lateral wall and mid anterolateral segment are akinetic. The entire anterior wall, basal anterolateral segment, and mid  inferior segment are hypokinetic.  Right Ventricle: The right ventricular size is normal. No increase in right ventricular wall thickness. Right ventricular systolic function is mildly reduced. There is mildly elevated pulmonary artery systolic pressure. The tricuspid regurgitant velocity is 2.88 m/s, and with an assumed right atrial pressure of 8 mmHg, the estimated right ventricular systolic pressure is 41.2 mmHg.  Left Atrium: Left atrial size was severely dilated.  Right Atrium: Right atrial size was normal in size.  Pericardium: A small pericardial effusion is present. The pericardial effusion is localized near the right atrium.  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 There is splay artifact. The mitral valve is grossly normal. Severe mitral valve regurgitation. No evidence of mitral valve stenosis.  Tricuspid Valve: The tricuspid valve is normal in structure. Tricuspid valve regurgitation  is mild . No evidence of tricuspid stenosis.  Aortic Valve: The aortic valve is tricuspid. There is mild calcification of the aortic valve. Aortic valve regurgitation is mild to moderate. No aortic stenosis is present.  Pulmonic Valve: The pulmonic valve was normal in structure. Pulmonic valve regurgitation is mild. No evidence of pulmonic stenosis.  Aorta: The aortic root is normal in size and structure.  Venous: The inferior vena cava is dilated in size with greater than 50% respiratory variability, suggesting right atrial pressure of 8 mmHg.  IAS/Shunts: No atrial level shunt detected by color flow Doppler.   LEFT VENTRICLE PLAX 2D LVIDd:         6.90 cm      Diastology LVIDs:         5.50 cm      LV e' medial:    5.55 cm/s LV PW:         1.00 cm      LV E/e' medial:  22.3 LV IVS:        0.80 cm      LV e' lateral:   9.36 cm/s LVOT diam:     2.40 cm      LV E/e' lateral: 13.2 LV  SV:         106 LV SV Index:   55 LVOT Area:     4.52 cm  LV Volumes (MOD) LV vol d, MOD A2C: 200.0 ml LV vol d, MOD A4C: 180.0 ml LV vol s, MOD A2C: 127.0 ml LV vol s, MOD A4C: 115.0 ml LV SV MOD A2C:     73.0 ml LV SV MOD A4C:     180.0 ml LV SV MOD BP:      64.7 ml  RIGHT VENTRICLE RV S prime:     14.60 cm/s TAPSE (M-mode): 2.1 cm  LEFT ATRIUM              Index        RIGHT ATRIUM           Index LA diam:        5.00 cm  2.58 cm/m   RA Area:     17.80 cm LA Vol (A2C):   94.1 ml  48.52 ml/m  RA Volume:   50.40 ml  25.99 ml/m LA Vol (A4C):   101.0 ml 52.08 ml/m LA Biplane Vol: 97.6 ml  50.32 ml/m AORTIC VALVE LVOT Vmax:   112.00 cm/s LVOT Vmean:  77.700 cm/s LVOT VTI:    0.235 m  AORTA Ao Root diam: 3.40 cm Ao Asc diam:  3.50 cm  MITRAL VALVE                  TRICUSPID VALVE MV Area (PHT): 3.34 cm       TR Peak grad:   33.2 mmHg MV Decel Time: 227 msec       TR Vmax:        288.00 cm/s MR Peak grad:    141.4 mmHg MR Mean grad:    102.5 mmHg   SHUNTS MR Vmax:         594.50 cm/s  Systemic VTI:  0.24 m MR Vmean:        485.5 cm/s   Systemic Diam: 2.40 cm MR PISA:         1.90 cm MR PISA Eff ROA: 12 mm MR PISA Radius:  0.55 cm MV E velocity: 124.00 cm/s MV A velocity: 71.70 cm/s MV E/A ratio:  1.73  Weston Brass MD Electronically signed by Weston Brass MD Signature Date/Time: 05/07/2022/11:05:55 AM    Final  TEE  ECHO TEE 07/16/2022  Narrative TRANSESOPHOGEAL ECHO REPORT    Patient Name:   JANICE SEALES Date of Exam: 07/16/2022 Medical Rec #:  [id number]      Height:       69.0 in Accession #:    [id number]     Weight:       185.0 lb Date of Birth:  February 08, 1956       BSA:          1.999 m Patient Age:    66 years       BP:           154/91 mmHg Patient Gender: M              HR:           69 bpm. Exam Location:  Outpatient  Procedure: Transesophageal Echo, Cardiac Doppler and Pediatric Echo  Indications:     Miral  Regurgitation  History:         Patient has prior history of Echocardiogram examinations, most recent 05/26/2022. Previous Myocardial Infarction, Mitral Valve Disease; Risk Factors:Hypertension.  Sonographer:     Darlys Gales Referring Phys:  [phone] MICHAEL COOPER Diagnosing Phys: Thurmon Fair MD  PROCEDURE: The transesophogeal probe was passed without difficulty through the esophogus of the patient. Sedation performed by different physician. The patient developed no complications during the procedure.  IMPRESSIONS   1. Left ventricular ejection fraction, by estimation, is 40 to 45%. The left ventricle has mildly decreased function. The left ventricle demonstrates regional wall motion abnormalities (see scoring diagram/findings for description). The left ventricular internal cavity size was mildly dilated. There is moderate concentric left ventricular hypertrophy. There is severe hypokinesis of the left ventricular, entire inferolateral wall. There is mild hypokinesis of the left ventricular, basal lateral wall and inferior wall. 2. Right ventricular systolic function is normal. The right ventricular size is normal. Tricuspid regurgitation signal is inadequate for assessing PA pressure. 3. Left atrial size was severely dilated. No left atrial/left atrial appendage thrombus was detected. 4. Right atrial size was mildly dilated. 5. During the first few minutes of the study there is moderate mitral insufficiency, that then improved to mild mitral insufficiency, even though the systolic BP was raised to > 150 mm Hg. The change appears to be related to a reduction in the degree of tethering of the posterior leaflet, allowing improved coaptation. The mitral valve is normal in structure. Mild mitral valve regurgitation. 6. The aortic valve is tricuspid. There is mild calcification of the aortic valve. There is mild thickening of the aortic valve. Aortic valve regurgitation is mild. Aortic valve  sclerosis/calcification is present, without any evidence of aortic stenosis. 7. There is mild (Grade II) protruding plaque involving the descending aorta and ascending aorta.  Comparison(s): Compared to the previous TEE there is marked reduction in the severity of mitral insufficiency. The mitral insufficiency appears to be quite dynamic in real-time, suggesting a mechanism of acute ischemia.  FINDINGS Left Ventricle: Left ventricular ejection fraction, by estimation, is 40 to 45%. The left ventricle has mildly decreased function. The left ventricle demonstrates regional wall motion abnormalities. Severe hypokinesis of the left ventricular, entire inferolateral wall. Mild hypokinesis of the left ventricular, basal lateral wall and inferior wall. The left ventricular internal cavity size was mildly dilated. There is moderate concentric left ventricular hypertrophy.   LV Wall Scoring:  The posterior wall, basal anterolateral segment, and basal inferior segment are hypokinetic. The entire anterior wall, entire septum, entire apex, mid and distal inferior wall, and mid anterolateral segment are normal.  Right Ventricle: The right ventricular size is normal. No increase in right ventricular wall thickness. Right ventricular systolic function is normal. Tricuspid regurgitation signal is inadequate for assessing PA pressure.  Left Atrium: Left atrial size was severely dilated. No left atrial/left atrial appendage thrombus was detected.  Right Atrium: Right atrial size was mildly dilated.  Pericardium: Trivial pericardial effusion is present.  Mitral Valve: During the first few minutes of the study there is moderate mitral insufficiency, that then improved to mild mitral insufficiency, even though the systolic BP was raised to > 150 mm Hg. The change appears to be related to a reduction in the degree of tethering of the posterior leaflet, allowing improved coaptation. The mitral valve is normal in  structure. Mild mitral valve regurgitation.  Tricuspid Valve: The tricuspid valve is normal in structure. Tricuspid valve regurgitation is not demonstrated.  Aortic Valve: The aortic valve is tricuspid. There is mild calcification of the aortic valve. There is mild thickening of the aortic valve. Aortic valve regurgitation is mild. Aortic valve sclerosis/calcification is present, without any evidence of aortic stenosis. Aortic valve mean gradient measures 2.5 mmHg. Aortic valve peak gradient measures 4.5 mmHg.  Pulmonic Valve: The pulmonic valve was grossly normal. Pulmonic valve regurgitation is not visualized.  Aorta: The aortic root, ascending aorta, aortic arch and descending aorta are all structurally normal, with no evidence of dilitation or obstruction. There is mild (Grade II) protruding plaque involving the descending aorta and ascending aorta.  Venous: A normal flow pattern is recorded from the left upper pulmonary vein.  IAS/Shunts: No atrial level shunt detected by color flow Doppler.   AORTIC VALVE AV Vmax:           105.83 cm/s AV Vmean:          72.120 cm/s AV VTI:            0.206 m AV Peak Grad:      4.5 mmHg AV Mean Grad:      2.5 mmHg LVOT Vmax:         65.88 cm/s LVOT Vmean:        45.741 cm/s LVOT VTI:          0.129 m LVOT/AV VTI ratio: 0.63   SHUNTS Systemic VTI: 0.13 m  Rachelle Hora Croitoru MD Electronically signed by Thurmon Fair MD Signature Date/Time: 07/16/2022/1:28:31 PM    Final              Current Reported Medications:.    Current Meds  Medication Sig   acetaminophen (TYLENOL) 325 MG tablet Take 2 tablets (650 mg total) by mouth every 4 (four) hours as needed for fever, headache or mild pain.   amLODipine (NORVASC) 10 MG tablet Take 10 mg by mouth at bedtime.   apixaban (ELIQUIS) 5 MG TABS tablet Take 2.5 mg by mouth 2 (two) times daily.   atorvastatin (LIPITOR) 80 MG tablet Take 1 tablet (80 mg total) by mouth daily. (Patient taking  differently: Take 80 mg by mouth in the morning.)   brimonidine (ALPHAGAN) 0.2 % ophthalmic solution Place 1 drop into both eyes daily.   calcitRIOL (ROCALTROL) 0.25 MCG capsule Take 0.25 mcg by mouth every Monday, Wednesday, and Friday.   carvedilol (COREG) 12.5 MG tablet Take 1 tablet (12.5 mg total) by mouth 2 (two)  times daily.   colchicine 0.6 MG tablet Take 0.5 tablets (0.3 mg total) by mouth daily. (Patient taking differently: Take 0.3 mg by mouth 2 (two) times daily.)   Daridorexant HCl 25 MG TABS Take 25 mg by mouth at bedtime.   DOCUSATE SODIUM PO Take 1 capsule by mouth daily.   esomeprazole (NEXIUM) 20 MG capsule Take 1 capsule (20 mg total) by mouth daily before breakfast.   folic acid (FOLVITE) 1 MG tablet Take 1 tablet (1 mg total) by mouth daily. (Patient taking differently: Take 1 mg by mouth at bedtime.)   furosemide (LASIX) 40 MG tablet Take 1 tablet (40 mg total) by mouth daily as needed for edema or fluid. (Patient taking differently: Take 40 mg by mouth daily.)   isosorbide mononitrate (IMDUR) 60 MG 24 hr tablet Take 1 tablet (60 mg total) by mouth daily.   melatonin 3 MG TABS tablet Take 3 mg by mouth at bedtime.   metoprolol succinate (TOPROL-XL) 100 MG 24 hr tablet Take 100 mg by mouth daily.   OLANZapine (ZYPREXA) 10 MG tablet Take 5-15 mg by mouth See admin instructions. Take 5 mg in the morning and 15 mg at night   potassium chloride SA (KLOR-CON M) 20 MEQ tablet TAKE 4 TABLETS BY MOUTH TODAY THEN TAKE 2 TABLETS BY MOUTH EVERY DAY   sertraline (ZOLOFT) 100 MG tablet Take 100 mg by mouth daily.   sodium bicarbonate 650 MG tablet Take 650 mg by mouth 2 (two) times daily.   [DISCONTINUED] JARDIANCE 10 MG TABS tablet Take 10 mg by mouth daily.   [DISCONTINUED] spironolactone (ALDACTONE) 50 MG tablet Take 50 mg by mouth daily.   Physical Exam:    VS:  BP 130/70   Pulse 75   Ht 5\' 8"  (1.727 m)   Wt 199 lb 12.8 oz (90.6 kg)   SpO2 95%   BMI 30.38 kg/m    Wt Readings  from Last 3 Encounters:  03/19/23 199 lb 12.8 oz (90.6 kg)  01/27/23 200 lb (90.7 kg)  01/12/23 198 lb 10.2 oz (90.1 kg)    GEN: Well nourished, well developed in no acute distress NECK: No JVD; No carotid bruits CARDIAC: RRR, no murmurs, rubs, gallops RESPIRATORY:  Clear to auscultation without rales, wheezing or rhonchi  ABDOMEN: Soft, non-tender, non-distended EXTREMITIES:  No edema; No acute deformity  Asessement and Plan:.    Chronic systolic CHF: TEE in 07/16/2022 indicated LVEF of 40 to 45%, moderate LVH.  Following repeat hospitalizations in 09/13/2022 and 11/14/2022 patient was instructed to discontinue Jardiance however it is still on his AVS today.  Patient also had previously been instructed to discontinue spironolactone.  Unfortunately he is unsure of what medications he is currently taking, he notes that he is closely followed by his nephrologist given CKD stage IV.  Today he reports that he is doing very well, he denies shortness of breath, lower extremity edema, orthopnea or PND.  He reports that his weights are stable at home.  Discussed that he should notify the office of increased lower extremity edema not improved with Lasix, shortness of breath, weight gain of 2 to 3 pounds overnight or 5 pounds in a week.  Asked patient to have his home health nurse called the office to confirm his current medication regimen is given his CKD he should not be on spironolactone or Jardiance, patient believes he is no longer on these medications.   Severe MR: Patient was initially planned to undergo TEER for severe  mitral digitation however TEE at start of procedure indicated that he had mild regurgitation.  CAD: LHC on 07/03/2022 indicated patent left main stem and LAD without significant stenosis, subtotal occlusion of the mid circumflex and the mid RCA, RCA collateralized from a septal perforating cascade of the LAD. Stable with no anginal symptoms. No indication for ischemic evaluation.    CKD  stage IV: Creatinine 3.89 on 03/16/2023.  Followed by nephrology.  Hx of DVT: On Eliquis.  Denies bleeding problems.  Hyperlipidemia: On atorvastatin.  Peripheral arterial disease:Per Dr. 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  Today he reports stable mild bilateral calf claudication, he notes that he feels this is overall improved as he is trying to lose weight.    Disposition: F/u with Dr. Kirke Corin in 3-4 months (pt request) or sooner if needed.   Signed, Rip Harbour, NP

## 2023-03-19 ENCOUNTER — Encounter: Payer: Self-pay | Admitting: Cardiology

## 2023-03-19 ENCOUNTER — Ambulatory Visit: Payer: Medicare Other | Attending: Cardiology | Admitting: Cardiology

## 2023-03-19 VITALS — BP 130/70 | HR 75 | Ht 68.0 in | Wt 199.8 lb

## 2023-03-19 DIAGNOSIS — I739 Peripheral vascular disease, unspecified: Secondary | ICD-10-CM | POA: Diagnosis not present

## 2023-03-19 DIAGNOSIS — I5022 Chronic systolic (congestive) heart failure: Secondary | ICD-10-CM

## 2023-03-19 DIAGNOSIS — I340 Nonrheumatic mitral (valve) insufficiency: Secondary | ICD-10-CM | POA: Diagnosis not present

## 2023-03-19 DIAGNOSIS — I10 Essential (primary) hypertension: Secondary | ICD-10-CM

## 2023-03-19 DIAGNOSIS — E785 Hyperlipidemia, unspecified: Secondary | ICD-10-CM

## 2023-03-19 DIAGNOSIS — I2510 Atherosclerotic heart disease of native coronary artery without angina pectoris: Secondary | ICD-10-CM

## 2023-03-19 NOTE — Patient Instructions (Signed)
Medication Instructions:  Stop Jardiance Stop Spirolactone *If you need a refill on your cardiac medications before your next appointment, please call your pharmacy*  Lab Work: No labs  Testing/Procedures: No testing   Follow-Up: At [facility], you and your health needs are our priority.  As part of our continuing mission to provide you with exceptional heart care, we have created designated Provider Care Teams.  These Care Teams include your primary Cardiologist (physician) and Advanced Practice Providers (APPs -  Physician Assistants and Nurse Practitioners) who all work together to provide you with the care you need, when you need it.  We recommend signing up for the patient portal called "MyChart".  Sign up information is provided on this After Visit Summary.  MyChart is used to connect with patients for Virtual Visits (Telemedicine).  Patients are able to view lab/test results, encounter notes, upcoming appointments, etc.  Non-urgent messages can be sent to your provider as well.   To learn more about what you can do with MyChart, go to [email or web address].    Your next appointment:   3 month(s)  Provider:   [name], MD    Other Instructions Please have home health nurse check and confirm what medications you are taking and please have them call our office.

## 2023-03-20 ENCOUNTER — Telehealth: Payer: Self-pay | Admitting: Cardiovascular Disease

## 2023-03-20 ENCOUNTER — Encounter: Payer: Self-pay | Admitting: Cardiology

## 2023-03-20 DIAGNOSIS — I10 Essential (primary) hypertension: Secondary | ICD-10-CM | POA: Diagnosis not present

## 2023-03-20 DIAGNOSIS — N184 Chronic kidney disease, stage 4 (severe): Secondary | ICD-10-CM | POA: Diagnosis not present

## 2023-03-20 DIAGNOSIS — I429 Cardiomyopathy, unspecified: Secondary | ICD-10-CM | POA: Diagnosis not present

## 2023-03-20 DIAGNOSIS — M109 Gout, unspecified: Secondary | ICD-10-CM | POA: Diagnosis not present

## 2023-03-20 DIAGNOSIS — D6869 Other thrombophilia: Secondary | ICD-10-CM | POA: Diagnosis not present

## 2023-03-20 DIAGNOSIS — Z299 Encounter for prophylactic measures, unspecified: Secondary | ICD-10-CM | POA: Diagnosis not present

## 2023-03-20 NOTE — Telephone Encounter (Signed)
I spoke with the patient, who stated that he was advised yesterday to call back with an updated medication list. He mentioned that his home health nurse will not be coming until Tuesday and noted that he will call back on Tuesday with the updated list

## 2023-03-20 NOTE — Telephone Encounter (Signed)
Patient states he was told to call to give updated medication list today once the home health nurse arrived. He states that the nurse is now coming on Tuesday of next week now and will not be able to give that information until then.

## 2023-03-24 NOTE — Telephone Encounter (Signed)
Updated med list

## 2023-03-24 NOTE — Telephone Encounter (Signed)
Caller (Nurse Carleta) returned NP [employer] call regarding patient's medication.

## 2023-03-26 ENCOUNTER — Encounter: Payer: Self-pay | Admitting: *Deleted

## 2023-03-27 ENCOUNTER — Other Ambulatory Visit: Payer: Self-pay

## 2023-03-27 DIAGNOSIS — D631 Anemia in chronic kidney disease: Secondary | ICD-10-CM

## 2023-03-27 DIAGNOSIS — D696 Thrombocytopenia, unspecified: Secondary | ICD-10-CM

## 2023-03-30 ENCOUNTER — Inpatient Hospital Stay (HOSPITAL_BASED_OUTPATIENT_CLINIC_OR_DEPARTMENT_OTHER): Payer: Medicare Other | Admitting: Oncology

## 2023-03-30 ENCOUNTER — Inpatient Hospital Stay: Payer: Medicare Other | Attending: Hematology

## 2023-03-30 ENCOUNTER — Inpatient Hospital Stay: Payer: Medicare Other

## 2023-03-30 VITALS — BP 126/80 | HR 70 | Temp 96.9°F | Resp 20

## 2023-03-30 DIAGNOSIS — N183 Chronic kidney disease, stage 3 unspecified: Secondary | ICD-10-CM

## 2023-03-30 DIAGNOSIS — N189 Chronic kidney disease, unspecified: Secondary | ICD-10-CM | POA: Insufficient documentation

## 2023-03-30 DIAGNOSIS — D631 Anemia in chronic kidney disease: Secondary | ICD-10-CM | POA: Insufficient documentation

## 2023-03-30 DIAGNOSIS — D509 Iron deficiency anemia, unspecified: Secondary | ICD-10-CM | POA: Diagnosis not present

## 2023-03-30 DIAGNOSIS — N1832 Chronic kidney disease, stage 3b: Secondary | ICD-10-CM

## 2023-03-30 DIAGNOSIS — D696 Thrombocytopenia, unspecified: Secondary | ICD-10-CM | POA: Diagnosis not present

## 2023-03-30 LAB — CBC WITH DIFFERENTIAL/PLATELET
Abs Immature Granulocytes: 0.04 10*3/uL (ref 0.00–0.07)
Basophils Absolute: 0 10*3/uL (ref 0.0–0.1)
Basophils Relative: 1 %
Eosinophils Absolute: 0.4 10*3/uL (ref 0.0–0.5)
Eosinophils Relative: 7 %
HCT: 35.8 % — ABNORMAL LOW (ref 39.0–52.0)
Hemoglobin: 11.6 g/dL — ABNORMAL LOW (ref 13.0–17.0)
Immature Granulocytes: 1 %
Lymphocytes Relative: 21 %
Lymphs Abs: 1.3 10*3/uL (ref 0.7–4.0)
MCH: 30.8 pg (ref 26.0–34.0)
MCHC: 32.4 g/dL (ref 30.0–36.0)
MCV: 95 fL (ref 80.0–100.0)
Monocytes Absolute: 0.6 10*3/uL (ref 0.1–1.0)
Monocytes Relative: 9 %
Neutro Abs: 3.7 10*3/uL (ref 1.7–7.7)
Neutrophils Relative %: 61 %
Platelets: 120 10*3/uL — ABNORMAL LOW (ref 150–400)
RBC: 3.77 MIL/uL — ABNORMAL LOW (ref 4.22–5.81)
RDW: 13.1 % (ref 11.5–15.5)
WBC: 6 10*3/uL (ref 4.0–10.5)
nRBC: 0 % (ref 0.0–0.2)

## 2023-03-30 MED ORDER — FERROUS SULFATE 325 (65 FE) MG PO TBEC: 325.0000 mg | DELAYED_RELEASE_TABLET | ORAL | 3 refills | Status: AC

## 2023-03-30 NOTE — Patient Instructions (Signed)
VISIT SUMMARY:  You came in today for a follow-up visit regarding your anemia and kidney disease. You reported feeling well with no new complaints and have been following your dietary recommendations. Your hemoglobin levels have improved, and your creatinine levels remain stable.  YOUR PLAN:  -ANEMIA: Anemia is a condition where you don't have enough healthy red blood cells to carry adequate oxygen to your body's tissues. Your hemoglobin has improved to 11.6, but your iron levels are slightly low. We will start you on iron supplements to be taken every other day. The prescription will be sent to [facility] in [place]. Please continue with your current dietary habits.  -CHRONIC KIDNEY DISEASE: Chronic kidney disease means your kidneys are damaged and can't filter blood as well as they should. Your last creatinine level was stable at 3.89, so no changes to your current management plan are needed.  -GENERAL HEALTH MAINTENANCE: We will transition you to monthly shot appointments and continue to monitor your labs to determine the need for these shots. Please return for a follow-up visit in two months.  INSTRUCTIONS:  Please follow up in two months for your next appointment. Continue to monitor your labs as discussed, and start taking the prescribed iron supplements every other day. Your prescription will be available at [facility] in [place].

## 2023-03-30 NOTE — Assessment & Plan Note (Signed)
Patient with a history of anemia chronic kidney disease.  Patient previously received EPO injections for anemia but stopped due to improvement in hemoglobin levels.  Hemoglobin 11.6 today. -No indication for EPO at this time -Will space out CBC check and EPO shots to monthly.  Will administer EPO shot if hemoglobin is less than 10.  RTC in 2 months

## 2023-03-30 NOTE — Assessment & Plan Note (Signed)
Chronic low platelet count, cause unknown. No known liver disease or alcohol use. Normal liver and spleen on previous MRI and CT abdomen. HIV and hepatitis panel negative. 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  -Improved from prior -No indication for transfusion or treatment at this time

## 2023-03-30 NOTE — Progress Notes (Signed)
No injection today, hemoglobin is 11.6

## 2023-03-30 NOTE — Telephone Encounter (Signed)
Given history of heart failure, multiple hospitalizations for fluid overload and CKD stage IV, being followed by nephrology. Would recommend that he follow up with advanced heart failure clinic for ongoing medication management.

## 2023-03-30 NOTE — Assessment & Plan Note (Addendum)
Very mild iron deficiency at this time. Tsat of 20. -Start oral ferrous sulfate every other day

## 2023-03-30 NOTE — Progress Notes (Signed)
[facility] at [facility] HEMATOLOGY FOLLOW-UP VISIT  [name], MD  REASON FOR FOLLOW-UP: Anemia of chronic kidney disease  ASSESSMENT & PLAN:  Anemia in chronic kidney disease Patient with a history of anemia chronic kidney disease.  Patient previously received EPO injections for anemia but stopped due to improvement in hemoglobin levels.  Hemoglobin 11.6 today. -No indication for EPO at this time -Will space out CBC check and EPO shots to monthly.  Will administer EPO shot if hemoglobin is less than 10.  RTC in 2 months  Thrombocytopenia (HCC) Chronic low platelet count, cause unknown. No known liver disease or alcohol use. Normal liver and spleen on previous MRI and CT abdomen. HIV and hepatitis panel negative. 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  -Improved from prior -No indication for transfusion or treatment at this time  Iron deficiency anemia Very mild iron deficiency at this time. Tsat of 20. -Start oral ferrous sulfate every other day   Orders Placed This Encounter  Procedures   CBC with Differential/Platelet    Standing Status:   Future    Expected Date:   05/26/2023    Expiration Date:   03/29/2024   Comprehensive metabolic panel    Standing Status:   Future    Expected Date:   05/26/2023    Expiration Date:   03/29/2024   Ferritin    Standing Status:   Future    Expected Date:   05/26/2023    Expiration Date:   03/29/2024   Iron and TIBC    Standing Status:   Future    Expected Date:   05/26/2023    Expiration Date:   03/29/2024    The total time spent in the appointment was 15 minutes encounter with patients including review of chart and various tests results, discussions about plan of care and coordination of care plan   All questions were answered. The patient knows to call the clinic with any problems, questions or concerns. No barriers to learning was detected.  Cindie Crumbly, MD 2/3/20252:25  PM   INTERVAL HISTORY: Glenn Hawkins 68 y.o. male following for anemia of chronic kidney disease.  Patient also has thrombocytopenia of unknown cause for several years and has been stable so far. He reports feeling well and has no new complaints.  Denies rashes, petechiae, bleeding.  I have reviewed the past medical history, past surgical history, social history and family history with the patient   ALLERGIES:  is allergic to nifedipine, aciphex [rabeprazole sodium], benazepril, haloperidol, and prilosec [omeprazole].  MEDICATIONS:  Current Outpatient Medications  Medication Sig Dispense Refill   acetaminophen (TYLENOL) 325 MG tablet Take 2 tablets (650 mg total) by mouth every 4 (four) hours as needed for fever, headache or mild pain. 20 tablet 0   amLODipine (NORVASC) 10 MG tablet Take 10 mg by mouth at bedtime.     apixaban (ELIQUIS) 5 MG TABS tablet Take 2.5 mg by mouth 2 (two) times daily.     atorvastatin (LIPITOR) 80 MG tablet Take 1 tablet (80 mg total) by mouth daily. (Patient taking differently: Take 80 mg by mouth in the morning.) 30 tablet 3   brimonidine (ALPHAGAN) 0.2 % ophthalmic solution Place 1 drop into both eyes daily.     calcitRIOL (ROCALTROL) 0.25 MCG capsule Take 0.25 mcg by mouth every Monday, Wednesday, and Friday.     carvedilol (COREG) 12.5 MG tablet Take 1 tablet (12.5 mg total) by  mouth 2 (two) times daily. 60 tablet 11   colchicine 0.6 MG tablet Take 0.5 tablets (0.3 mg total) by mouth daily. (Patient taking differently: Take 0.3 mg by mouth 2 (two) times daily.) 30 tablet 3   Daridorexant HCl 25 MG TABS Take 25 mg by mouth at bedtime.     DOCUSATE SODIUM PO Take 1 capsule by mouth daily.     esomeprazole (NEXIUM) 20 MG capsule Take 1 capsule (20 mg total) by mouth daily before breakfast. 30 capsule 3   ferrous sulfate 325 (65 FE) MG EC tablet Take 1 tablet (325 mg total) by mouth every other day. 45 tablet 3   folic acid (FOLVITE) 1 MG tablet Take 1 tablet (1  mg total) by mouth daily. (Patient taking differently: Take 1 mg by mouth at bedtime.)     furosemide (LASIX) 40 MG tablet Take 1 tablet (40 mg total) by mouth daily as needed for edema or fluid. (Patient taking differently: Take 40 mg by mouth daily.) 90 tablet 2   isosorbide mononitrate (IMDUR) 60 MG 24 hr tablet Take 1 tablet (60 mg total) by mouth daily. 60 tablet 1   melatonin 3 MG TABS tablet Take 3 mg by mouth at bedtime.     metoprolol succinate (TOPROL-XL) 100 MG 24 hr tablet Take 100 mg by mouth daily.     OLANZapine (ZYPREXA) 10 MG tablet Take 5-15 mg by mouth See admin instructions. Take 5 mg in the morning and 15 mg at night     potassium chloride (MICRO-K) 10 MEQ CR capsule Take 10 mEq by mouth 2 (two) times daily.     potassium chloride SA (KLOR-CON M) 20 MEQ tablet TAKE 4 TABLETS BY MOUTH TODAY THEN TAKE 2 TABLETS BY MOUTH EVERY DAY 100 tablet 0   sertraline (ZOLOFT) 100 MG tablet Take 100 mg by mouth daily.     sodium bicarbonate 650 MG tablet Take 650 mg by mouth 2 (two) times daily.     No current facility-administered medications for this visit.     REVIEW OF SYSTEMS:   Constitutional: Denies fevers, chills or night sweats Eyes: Denies blurriness of vision Ears, nose, mouth, throat, and face: Denies mucositis or sore throat Respiratory: Denies cough, dyspnea or wheezes Cardiovascular: Denies palpitation, chest discomfort or lower extremity swelling Gastrointestinal:  Denies nausea, heartburn or change in bowel habits Skin: Denies abnormal skin rashes Lymphatics: Denies new lymphadenopathy or easy bruising Neurological:Denies numbness, tingling or new weaknesses Behavioral/Psych: Mood is stable, no new changes  All other systems were reviewed with the patient and are negative.  PHYSICAL EXAMINATION:   Vitals:   03/30/23 1346 03/30/23 1351  BP: (!) 153/84 126/80  Pulse: 70   Resp: 20   Temp: (!) 96.9 F (36.1 C)   SpO2: 100%     GENERAL:alert, no distress and  comfortable SKIN: skin color, texture, turgor are normal, no rashes or significant lesions LUNGS: clear to auscultation and percussion with normal breathing effort HEART: regular rate & rhythm and no murmurs and no lower extremity edema ABDOMEN:abdomen soft, non-tender and normal bowel sounds Musculoskeletal:no cyanosis of digits and no clubbing  NEURO: alert & oriented x 3 with fluent speech  LABORATORY DATA:  I have reviewed the data as listed  Lab Results  Component Value Date   WBC 6.0 03/30/2023   NEUTROABS 3.7 03/30/2023   HGB 11.6 (L) 03/30/2023   HCT 35.8 (L) 03/30/2023   MCV 95.0 03/30/2023   PLT 120 (L) 03/30/2023  Chemistry      Component Value Date/Time   NA 137 03/16/2023 1310   NA 145 (H) 07/14/2022 0915   K 4.3 03/16/2023 1310   CL 106 03/16/2023 1310   CO2 20 (L) 03/16/2023 1310   BUN 54 (H) 03/16/2023 1310   BUN 49 (H) 07/14/2022 0915   CREATININE 3.89 (H) 03/16/2023 1310   CREATININE 4.47 (H) 11/22/2020 1348      Component Value Date/Time   CALCIUM 9.0 03/16/2023 1310   ALKPHOS 100 03/16/2023 1310   AST 15 03/16/2023 1310   ALT 11 03/16/2023 1310   BILITOT 0.6 03/16/2023 1310   BILITOT 0.4 07/14/2022 0915      Latest Reference Range & Units 05/06/22 05:47 05/08/22 23:41 09/04/22 01:55 01/27/23 10:14 01/27/23 10:15 03/16/23 13:10  Iron 45 - 182 ug/dL 74  36 (L) 64  60  UIBC ug/dL 161  096 045  409  TIBC 250 - 450 ug/dL 811  914 (L) 782  956  Saturation Ratios 17.9 - 39.5 % 21  18 19  20   Ferritin 24 - 336 ng/mL 73  281 124  177  Folate >5.9 ng/mL  6.2 5.3 (L)  38.2   (L): Data is abnormally low

## 2023-04-02 ENCOUNTER — Ambulatory Visit: Payer: Self-pay | Admitting: *Deleted

## 2023-04-02 ENCOUNTER — Encounter: Payer: Self-pay | Admitting: *Deleted

## 2023-04-02 DIAGNOSIS — M79675 Pain in left toe(s): Secondary | ICD-10-CM | POA: Diagnosis not present

## 2023-04-02 DIAGNOSIS — M79671 Pain in right foot: Secondary | ICD-10-CM | POA: Diagnosis not present

## 2023-04-02 DIAGNOSIS — M79674 Pain in right toe(s): Secondary | ICD-10-CM | POA: Diagnosis not present

## 2023-04-02 DIAGNOSIS — M79672 Pain in left foot: Secondary | ICD-10-CM | POA: Diagnosis not present

## 2023-04-02 DIAGNOSIS — I739 Peripheral vascular disease, unspecified: Secondary | ICD-10-CM | POA: Diagnosis not present

## 2023-04-02 DIAGNOSIS — L609 Nail disorder, unspecified: Secondary | ICD-10-CM | POA: Diagnosis not present

## 2023-04-02 DIAGNOSIS — L110 Acquired keratosis follicularis: Secondary | ICD-10-CM | POA: Diagnosis not present

## 2023-04-02 NOTE — Patient Outreach (Signed)
  Care Coordination   Follow Up Visit Note   04/02/2023 Name: Glenn Hawkins MRN: [id number] DOB: [birth year]  EDAHI Hawkins is a 68 y.o. year old male who sees Vyas, Dhruv B, MD for primary care. I spoke with  Glenn Hawkins by phone today.  What matters to the patients health and wellness today?  Patient did not have any specific questions or concerns today.     Goals Addressed             This Visit's Progress    Manage CHF and Monitor for Edema   On track    Care Coordination Goals: Patient will notify provider of any new or worsening symptoms Patient will keep appointment with cardiologist on 02/24/23 Patient will follow-up with nephrologist in 2 months as planned Patient will take medications as prescribed Patient will check and record blood pressure daily and reach out to provider with any readings outside of recommended range Patient will check and record weight each morning after urinating and will call cardiologist with weight gain of >2 lbs overnight of >5 lbs in one week Patient will have daily weights and daily blood pressure readings available to discuss with RN at next scheduled telephone appointment Patient will monitor for any swelling in lower extremities, hands, & abdomen and call provider if any are noted Patient will call RN Care Coordinator [phone] with any resource or care coordination needs         SDOH assessments and interventions completed:  No     Care Coordination Interventions:  Yes, provided   Follow up plan: Follow up call scheduled for 04/30/23    Encounter Outcome:  Patient Visit Completed   Glenn Pellet, RN, BSN [place]  [facility], [facility] RN Care Manager Direct Dial: [phone]

## 2023-04-09 DIAGNOSIS — E211 Secondary hyperparathyroidism, not elsewhere classified: Secondary | ICD-10-CM | POA: Diagnosis not present

## 2023-04-09 DIAGNOSIS — R809 Proteinuria, unspecified: Secondary | ICD-10-CM | POA: Diagnosis not present

## 2023-04-09 DIAGNOSIS — D631 Anemia in chronic kidney disease: Secondary | ICD-10-CM | POA: Diagnosis not present

## 2023-04-09 DIAGNOSIS — N185 Chronic kidney disease, stage 5: Secondary | ICD-10-CM | POA: Diagnosis not present

## 2023-04-09 DIAGNOSIS — N189 Chronic kidney disease, unspecified: Secondary | ICD-10-CM | POA: Diagnosis not present

## 2023-04-13 DIAGNOSIS — R059 Cough, unspecified: Secondary | ICD-10-CM | POA: Diagnosis not present

## 2023-04-13 DIAGNOSIS — I10 Essential (primary) hypertension: Secondary | ICD-10-CM | POA: Diagnosis not present

## 2023-04-13 DIAGNOSIS — R5383 Other fatigue: Secondary | ICD-10-CM | POA: Diagnosis not present

## 2023-04-13 DIAGNOSIS — Z299 Encounter for prophylactic measures, unspecified: Secondary | ICD-10-CM | POA: Diagnosis not present

## 2023-04-13 DIAGNOSIS — J45909 Unspecified asthma, uncomplicated: Secondary | ICD-10-CM | POA: Diagnosis not present

## 2023-04-23 DIAGNOSIS — N184 Chronic kidney disease, stage 4 (severe): Secondary | ICD-10-CM | POA: Diagnosis not present

## 2023-04-23 DIAGNOSIS — I5042 Chronic combined systolic (congestive) and diastolic (congestive) heart failure: Secondary | ICD-10-CM | POA: Diagnosis not present

## 2023-04-23 DIAGNOSIS — I129 Hypertensive chronic kidney disease with stage 1 through stage 4 chronic kidney disease, or unspecified chronic kidney disease: Secondary | ICD-10-CM | POA: Diagnosis not present

## 2023-04-23 DIAGNOSIS — D631 Anemia in chronic kidney disease: Secondary | ICD-10-CM | POA: Diagnosis not present

## 2023-04-27 ENCOUNTER — Inpatient Hospital Stay: Payer: Medicare Other

## 2023-04-28 ENCOUNTER — Inpatient Hospital Stay

## 2023-04-28 ENCOUNTER — Inpatient Hospital Stay: Attending: Hematology

## 2023-04-28 DIAGNOSIS — N189 Chronic kidney disease, unspecified: Secondary | ICD-10-CM | POA: Diagnosis not present

## 2023-04-28 DIAGNOSIS — D631 Anemia in chronic kidney disease: Secondary | ICD-10-CM | POA: Insufficient documentation

## 2023-04-28 DIAGNOSIS — D509 Iron deficiency anemia, unspecified: Secondary | ICD-10-CM | POA: Insufficient documentation

## 2023-04-28 DIAGNOSIS — N183 Chronic kidney disease, stage 3 unspecified: Secondary | ICD-10-CM

## 2023-04-28 LAB — COMPREHENSIVE METABOLIC PANEL
ALT: 14 U/L (ref 0–44)
AST: 18 U/L (ref 15–41)
Albumin: 3.8 g/dL (ref 3.5–5.0)
Alkaline Phosphatase: 89 U/L (ref 38–126)
Anion gap: 10 (ref 5–15)
BUN: 29 mg/dL — ABNORMAL HIGH (ref 8–23)
CO2: 18 mmol/L — ABNORMAL LOW (ref 22–32)
Calcium: 8.7 mg/dL — ABNORMAL LOW (ref 8.9–10.3)
Chloride: 111 mmol/L (ref 98–111)
Creatinine, Ser: 2.85 mg/dL — ABNORMAL HIGH (ref 0.61–1.24)
GFR, Estimated: 24 mL/min — ABNORMAL LOW (ref >60)
Glucose, Bld: 130 mg/dL — ABNORMAL HIGH (ref 70–99)
Potassium: 3.7 mmol/L (ref 3.5–5.1)
Sodium: 139 mmol/L (ref 135–145)
Total Bilirubin: 0.4 mg/dL (ref 0.0–1.2)
Total Protein: 6.7 g/dL (ref 6.5–8.1)

## 2023-04-28 LAB — CBC WITH DIFFERENTIAL/PLATELET
Abs Immature Granulocytes: 0.05 10*3/uL (ref 0.00–0.07)
Basophils Absolute: 0 10*3/uL (ref 0.0–0.1)
Basophils Relative: 0 %
Eosinophils Absolute: 0 10*3/uL (ref 0.0–0.5)
Eosinophils Relative: 0 %
HCT: 35.7 % — ABNORMAL LOW (ref 39.0–52.0)
Hemoglobin: 11.2 g/dL — ABNORMAL LOW (ref 13.0–17.0)
Immature Granulocytes: 1 %
Lymphocytes Relative: 6 %
Lymphs Abs: 0.5 10*3/uL — ABNORMAL LOW (ref 0.7–4.0)
MCH: 30.2 pg (ref 26.0–34.0)
MCHC: 31.4 g/dL (ref 30.0–36.0)
MCV: 96.2 fL (ref 80.0–100.0)
Monocytes Absolute: 0.4 10*3/uL (ref 0.1–1.0)
Monocytes Relative: 4 %
Neutro Abs: 8.5 10*3/uL — ABNORMAL HIGH (ref 1.7–7.7)
Neutrophils Relative %: 89 %
Platelets: 79 10*3/uL — ABNORMAL LOW (ref 150–400)
RBC: 3.71 MIL/uL — ABNORMAL LOW (ref 4.22–5.81)
RDW: 14.3 % (ref 11.5–15.5)
Smear Review: DECREASED
WBC: 9.4 10*3/uL (ref 4.0–10.5)
nRBC: 0 % (ref 0.0–0.2)

## 2023-04-28 LAB — IRON AND TIBC
Iron: 50 ug/dL (ref 45–182)
Saturation Ratios: 19 % (ref 17.9–39.5)
TIBC: 264 ug/dL (ref 250–450)
UIBC: 214 ug/dL

## 2023-04-28 LAB — FERRITIN: Ferritin: 141 ng/mL (ref 24–336)

## 2023-04-28 NOTE — Progress Notes (Signed)
 No injection needed today per parameters. Patient's hemoglobin noted to be 11.2 today. Patient made aware and verbalized understanding.

## 2023-04-30 ENCOUNTER — Encounter: Payer: Self-pay | Admitting: *Deleted

## 2023-04-30 ENCOUNTER — Ambulatory Visit: Payer: Self-pay | Admitting: *Deleted

## 2023-05-01 DIAGNOSIS — K4090 Unilateral inguinal hernia, without obstruction or gangrene, not specified as recurrent: Secondary | ICD-10-CM | POA: Diagnosis not present

## 2023-05-01 DIAGNOSIS — Z79899 Other long term (current) drug therapy: Secondary | ICD-10-CM | POA: Diagnosis not present

## 2023-05-01 DIAGNOSIS — I110 Hypertensive heart disease with heart failure: Secondary | ICD-10-CM | POA: Diagnosis not present

## 2023-05-01 DIAGNOSIS — N200 Calculus of kidney: Secondary | ICD-10-CM | POA: Diagnosis not present

## 2023-05-01 DIAGNOSIS — Z7982 Long term (current) use of aspirin: Secondary | ICD-10-CM | POA: Diagnosis not present

## 2023-05-01 DIAGNOSIS — R1010 Upper abdominal pain, unspecified: Secondary | ICD-10-CM | POA: Diagnosis not present

## 2023-05-01 DIAGNOSIS — Z87891 Personal history of nicotine dependence: Secondary | ICD-10-CM | POA: Diagnosis not present

## 2023-05-01 DIAGNOSIS — K429 Umbilical hernia without obstruction or gangrene: Secondary | ICD-10-CM | POA: Diagnosis not present

## 2023-05-01 DIAGNOSIS — N281 Cyst of kidney, acquired: Secondary | ICD-10-CM | POA: Diagnosis not present

## 2023-05-01 DIAGNOSIS — R112 Nausea with vomiting, unspecified: Secondary | ICD-10-CM | POA: Diagnosis not present

## 2023-05-01 DIAGNOSIS — R1011 Right upper quadrant pain: Secondary | ICD-10-CM | POA: Diagnosis not present

## 2023-05-01 DIAGNOSIS — I509 Heart failure, unspecified: Secondary | ICD-10-CM | POA: Diagnosis not present

## 2023-05-11 DIAGNOSIS — E876 Hypokalemia: Secondary | ICD-10-CM | POA: Diagnosis not present

## 2023-05-11 DIAGNOSIS — I10 Essential (primary) hypertension: Secondary | ICD-10-CM | POA: Diagnosis not present

## 2023-05-11 DIAGNOSIS — F410 Panic disorder [episodic paroxysmal anxiety] without agoraphobia: Secondary | ICD-10-CM | POA: Diagnosis not present

## 2023-05-11 DIAGNOSIS — I5022 Chronic systolic (congestive) heart failure: Secondary | ICD-10-CM | POA: Diagnosis not present

## 2023-05-11 DIAGNOSIS — I25118 Atherosclerotic heart disease of native coronary artery with other forms of angina pectoris: Secondary | ICD-10-CM | POA: Diagnosis not present

## 2023-05-11 DIAGNOSIS — Z299 Encounter for prophylactic measures, unspecified: Secondary | ICD-10-CM | POA: Diagnosis not present

## 2023-05-12 DIAGNOSIS — M79675 Pain in left toe(s): Secondary | ICD-10-CM | POA: Diagnosis not present

## 2023-05-12 DIAGNOSIS — M79672 Pain in left foot: Secondary | ICD-10-CM | POA: Diagnosis not present

## 2023-05-12 DIAGNOSIS — I739 Peripheral vascular disease, unspecified: Secondary | ICD-10-CM | POA: Diagnosis not present

## 2023-05-12 DIAGNOSIS — M79674 Pain in right toe(s): Secondary | ICD-10-CM | POA: Diagnosis not present

## 2023-05-12 DIAGNOSIS — L609 Nail disorder, unspecified: Secondary | ICD-10-CM | POA: Diagnosis not present

## 2023-05-12 DIAGNOSIS — L110 Acquired keratosis follicularis: Secondary | ICD-10-CM | POA: Diagnosis not present

## 2023-05-12 DIAGNOSIS — M79671 Pain in right foot: Secondary | ICD-10-CM | POA: Diagnosis not present

## 2023-05-19 ENCOUNTER — Other Ambulatory Visit: Payer: Self-pay | Admitting: *Deleted

## 2023-05-19 DIAGNOSIS — I509 Heart failure, unspecified: Secondary | ICD-10-CM | POA: Diagnosis not present

## 2023-05-19 DIAGNOSIS — Z7901 Long term (current) use of anticoagulants: Secondary | ICD-10-CM | POA: Diagnosis not present

## 2023-05-19 DIAGNOSIS — Z888 Allergy status to other drugs, medicaments and biological substances status: Secondary | ICD-10-CM | POA: Diagnosis not present

## 2023-05-19 DIAGNOSIS — I252 Old myocardial infarction: Secondary | ICD-10-CM | POA: Diagnosis not present

## 2023-05-19 DIAGNOSIS — F200 Paranoid schizophrenia: Secondary | ICD-10-CM | POA: Diagnosis not present

## 2023-05-19 DIAGNOSIS — N189 Chronic kidney disease, unspecified: Secondary | ICD-10-CM | POA: Diagnosis not present

## 2023-05-19 DIAGNOSIS — N183 Chronic kidney disease, stage 3 unspecified: Secondary | ICD-10-CM

## 2023-05-19 DIAGNOSIS — I130 Hypertensive heart and chronic kidney disease with heart failure and stage 1 through stage 4 chronic kidney disease, or unspecified chronic kidney disease: Secondary | ICD-10-CM | POA: Diagnosis not present

## 2023-05-19 DIAGNOSIS — Z79899 Other long term (current) drug therapy: Secondary | ICD-10-CM | POA: Diagnosis not present

## 2023-05-22 ENCOUNTER — Other Ambulatory Visit: Payer: Self-pay | Admitting: Oncology

## 2023-05-25 ENCOUNTER — Inpatient Hospital Stay: Payer: Medicare Other

## 2023-05-25 ENCOUNTER — Inpatient Hospital Stay: Payer: Medicare Other | Admitting: Oncology

## 2023-05-25 NOTE — Progress Notes (Deleted)
 [facility] at [facility]  HEMATOLOGY FOLLOW-UP VISIT  [name], MD  REASON FOR FOLLOW-UP: Anemia of chronic kidney disease  ASSESSMENT & PLAN:   No problem-specific Assessment & Plan notes found for this encounter.    No orders of the defined types were placed in this encounter.   The total time spent in the appointment was 15 minutes encounter with patients including review of chart and various tests results, discussions about plan of care and coordination of care plan   All questions were answered. The patient knows to call the clinic with any problems, questions or concerns. No barriers to learning was detected.  Cindie Crumbly, MD 3/31/202510:20 AM   INTERVAL HISTORY: Glenn Hawkins 68 y.o. male following for anemia of chronic kidney disease.  Patient also has thrombocytopenia of unknown cause for several years and has been stable so far. He reports feeling well and has no new complaints.  Denies rashes, petechiae, bleeding.  I have reviewed the past medical history, past surgical history, social history and family history with the patient   ALLERGIES:  is allergic to nifedipine, aciphex [rabeprazole sodium], benazepril, haloperidol, and prilosec [omeprazole].  MEDICATIONS:  Current Outpatient Medications  Medication Sig Dispense Refill   acetaminophen (TYLENOL) 325 MG tablet Take 2 tablets (650 mg total) by mouth every 4 (four) hours as needed for fever, headache or mild pain. 20 tablet 0   amLODipine (NORVASC) 10 MG tablet Take 10 mg by mouth at bedtime.     apixaban (ELIQUIS) 5 MG TABS tablet Take 2.5 mg by mouth 2 (two) times daily.     atorvastatin (LIPITOR) 80 MG tablet Take 1 tablet (80 mg total) by mouth daily. (Patient taking differently: Take 80 mg by mouth in the morning.) 30 tablet 3   brimonidine (ALPHAGAN) 0.2 % ophthalmic solution Place 1 drop into both eyes daily.     calcitRIOL (ROCALTROL) 0.25 MCG capsule Take 0.25 mcg by  mouth every Monday, Wednesday, and Friday.     carvedilol (COREG) 12.5 MG tablet Take 1 tablet (12.5 mg total) by mouth 2 (two) times daily. 60 tablet 11   colchicine 0.6 MG tablet Take 0.5 tablets (0.3 mg total) by mouth daily. (Patient taking differently: Take 0.3 mg by mouth 2 (two) times daily.) 30 tablet 3   Daridorexant HCl 25 MG TABS Take 25 mg by mouth at bedtime.     DOCUSATE SODIUM PO Take 1 capsule by mouth daily.     esomeprazole (NEXIUM) 20 MG capsule Take 1 capsule (20 mg total) by mouth daily before breakfast. 30 capsule 3   ferrous sulfate 325 (65 FE) MG EC tablet Take 1 tablet (325 mg total) by mouth every other day. 45 tablet 3   folic acid (FOLVITE) 1 MG tablet Take 1 tablet (1 mg total) by mouth daily. (Patient taking differently: Take 1 mg by mouth at bedtime.)     furosemide (LASIX) 40 MG tablet Take 1 tablet (40 mg total) by mouth daily as needed for edema or fluid. (Patient taking differently: Take 40 mg by mouth daily.) 90 tablet 2   isosorbide mononitrate (IMDUR) 60 MG 24 hr tablet Take 1 tablet (60 mg total) by mouth daily. 60 tablet 1   melatonin 3 MG TABS tablet Take 3 mg by mouth at bedtime.     metoprolol succinate (TOPROL-XL) 100 MG 24 hr tablet Take 100 mg by mouth daily.     OLANZapine (ZYPREXA) 10 MG tablet Take 5-15  mg by mouth See admin instructions. Take 5 mg in the morning and 15 mg at night     potassium chloride (MICRO-K) 10 MEQ CR capsule Take 10 mEq by mouth 2 (two) times daily.     potassium chloride SA (KLOR-CON M) 20 MEQ tablet TAKE 4 TABLETS BY MOUTH TODAY THEN TAKE 2 TABLETS BY MOUTH EVERY DAY 100 tablet 0   sertraline (ZOLOFT) 100 MG tablet Take 100 mg by mouth daily.     sodium bicarbonate 650 MG tablet Take 650 mg by mouth 2 (two) times daily.     No current facility-administered medications for this visit.     REVIEW OF SYSTEMS:   Constitutional: Denies fevers, chills or night sweats Eyes: Denies blurriness of vision Ears, nose, mouth,  throat, and face: Denies mucositis or sore throat Respiratory: Denies cough, dyspnea or wheezes Cardiovascular: Denies palpitation, chest discomfort or lower extremity swelling Gastrointestinal:  Denies nausea, heartburn or change in bowel habits Skin: Denies abnormal skin rashes Lymphatics: Denies new lymphadenopathy or easy bruising Neurological:Denies numbness, tingling or new weaknesses Behavioral/Psych: Mood is stable, no new changes  All other systems were reviewed with the patient and are negative.  PHYSICAL EXAMINATION:   There were no vitals filed for this visit.   GENERAL:alert, no distress and comfortable SKIN: skin color, texture, turgor are normal, no rashes or significant lesions LUNGS: clear to auscultation and percussion with normal breathing effort HEART: regular rate & rhythm and no murmurs and no lower extremity edema ABDOMEN:abdomen soft, non-tender and normal bowel sounds Musculoskeletal:no cyanosis of digits and no clubbing  NEURO: alert & oriented x 3 with fluent speech  LABORATORY DATA:  I have reviewed the data as listed  Lab Results  Component Value Date   WBC 9.4 04/28/2023   NEUTROABS 8.5 (H) 04/28/2023   HGB 11.2 (L) 04/28/2023   HCT 35.7 (L) 04/28/2023   MCV 96.2 04/28/2023   PLT 79 (L) 04/28/2023       Chemistry      Component Value Date/Time   NA 139 04/28/2023 1354   NA 145 (H) 07/14/2022 0915   K 3.7 04/28/2023 1354   CL 111 04/28/2023 1354   CO2 18 (L) 04/28/2023 1354   BUN 29 (H) 04/28/2023 1354   BUN 49 (H) 07/14/2022 0915   CREATININE 2.85 (H) 04/28/2023 1354   CREATININE 4.47 (H) 11/22/2020 1348      Component Value Date/Time   CALCIUM 8.7 (L) 04/28/2023 1354   ALKPHOS 89 04/28/2023 1354   AST 18 04/28/2023 1354   ALT 14 04/28/2023 1354   BILITOT 0.4 04/28/2023 1354   BILITOT 0.4 07/14/2022 0915      Latest Reference Range & Units 04/28/23 13:53  Iron 45 - 182 ug/dL 50  UIBC ug/dL 161  TIBC 096 - 045 ug/dL 409   Saturation Ratios 17.9 - 39.5 % 19  Ferritin 24 - 336 ng/mL 141

## 2023-05-27 DIAGNOSIS — I5022 Chronic systolic (congestive) heart failure: Secondary | ICD-10-CM | POA: Diagnosis not present

## 2023-05-27 DIAGNOSIS — I10 Essential (primary) hypertension: Secondary | ICD-10-CM | POA: Diagnosis not present

## 2023-05-27 DIAGNOSIS — Z299 Encounter for prophylactic measures, unspecified: Secondary | ICD-10-CM | POA: Diagnosis not present

## 2023-05-28 ENCOUNTER — Inpatient Hospital Stay

## 2023-05-28 ENCOUNTER — Inpatient Hospital Stay (HOSPITAL_BASED_OUTPATIENT_CLINIC_OR_DEPARTMENT_OTHER): Admitting: Oncology

## 2023-05-28 ENCOUNTER — Inpatient Hospital Stay: Attending: Hematology

## 2023-05-28 VITALS — BP 151/81 | HR 74 | Temp 98.0°F | Resp 18 | Wt 211.2 lb

## 2023-05-28 DIAGNOSIS — D509 Iron deficiency anemia, unspecified: Secondary | ICD-10-CM | POA: Diagnosis not present

## 2023-05-28 DIAGNOSIS — D631 Anemia in chronic kidney disease: Secondary | ICD-10-CM | POA: Diagnosis not present

## 2023-05-28 DIAGNOSIS — N1832 Chronic kidney disease, stage 3b: Secondary | ICD-10-CM

## 2023-05-28 DIAGNOSIS — D696 Thrombocytopenia, unspecified: Secondary | ICD-10-CM | POA: Insufficient documentation

## 2023-05-28 DIAGNOSIS — N189 Chronic kidney disease, unspecified: Secondary | ICD-10-CM | POA: Insufficient documentation

## 2023-05-28 LAB — CBC
HCT: 33.6 % — ABNORMAL LOW (ref 39.0–52.0)
Hemoglobin: 10.5 g/dL — ABNORMAL LOW (ref 13.0–17.0)
MCH: 30.7 pg (ref 26.0–34.0)
MCHC: 31.3 g/dL (ref 30.0–36.0)
MCV: 98.2 fL (ref 80.0–100.0)
Platelets: 98 10*3/uL — ABNORMAL LOW (ref 150–400)
RBC: 3.42 MIL/uL — ABNORMAL LOW (ref 4.22–5.81)
RDW: 12.9 % (ref 11.5–15.5)
WBC: 5.6 10*3/uL (ref 4.0–10.5)
nRBC: 0 % (ref 0.0–0.2)

## 2023-05-28 NOTE — Assessment & Plan Note (Signed)
 Very mild iron deficiency at this time. Tsat of 19. -Continue oral ferrous sulfate every other day

## 2023-05-28 NOTE — Assessment & Plan Note (Signed)
 Chronic low platelet count, cause unknown. No known liver disease or alcohol use. Normal liver and spleen on previous MRI and CT abdomen. HIV and hepatitis panel negative. 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  -Improved from prior -No indication for transfusion or treatment at this time

## 2023-05-28 NOTE — Progress Notes (Signed)
 [facility] at [facility]  HEMATOLOGY FOLLOW-UP VISIT  Glenn Specking, MD  REASON FOR FOLLOW-UP: Anemia of chronic kidney disease  ASSESSMENT & PLAN:   Anemia in chronic kidney disease Patient with a history of anemia chronic kidney disease.  Patient previously received EPO injections for anemia but stopped due to improvement in hemoglobin levels.  Hemoglobin 10.5 today.  Patient has not received erythropoietin shots since 12/24 -No indication for EPO at this time -Will recheck CBC in 2 months.  Will administer EPO shot if hemoglobin is less than 10.  RTC in 2 months  Thrombocytopenia (HCC) Chronic low platelet count, cause unknown. No known liver disease or alcohol use. Normal liver and spleen on previous MRI and CT abdomen. HIV and hepatitis panel negative. 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  -Improved from prior -No indication for transfusion or treatment at this time  Iron deficiency anemia Very mild iron deficiency at this time. Tsat of 19. -Continue oral ferrous sulfate every other day    Orders Placed This Encounter  Procedures   Ferritin    Standing Status:   Future    Expected Date:   07/27/2023    Expiration Date:   05/27/2024   Folate    Standing Status:   Future    Expected Date:   07/27/2023    Expiration Date:   05/27/2024   Vitamin B12    Standing Status:   Future    Expected Date:   07/27/2023    Expiration Date:   05/27/2024   CBC with Differential/Platelet    Standing Status:   Future    Expected Date:   07/27/2023    Expiration Date:   05/27/2024   Comprehensive metabolic panel with GFR    Standing Status:   Future    Expected Date:   07/27/2023    Expiration Date:   05/27/2024   Iron and TIBC    Standing Status:   Future    Expected Date:   07/27/2023    Expiration Date:   05/27/2024    The total time spent in the appointment was 15 minutes encounter with patients including review of chart and  various tests results, discussions about plan of care and coordination of care plan  All questions were answered. The patient knows to call the clinic with any problems, questions or concerns. No barriers to learning was detected.  Cindie Crumbly, MD 4/3/20253:07 PM   INTERVAL HISTORY: Glenn Hawkins 68 y.o. male following for anemia of chronic kidney disease.  Patient also has thrombocytopenia of unknown cause for several years and has been stable so far. He reports feeling well and has no new complaints.  Denies rashes, petechiae, bleeding.  I have reviewed the past medical history, past surgical history, social history and family history with the patient   ALLERGIES:  is allergic to nifedipine, aciphex [rabeprazole sodium], benazepril, haloperidol, and prilosec [omeprazole].  MEDICATIONS:  Current Outpatient Medications  Medication Sig Dispense Refill   acetaminophen (TYLENOL) 325 MG tablet Take 2 tablets (650 mg total) by mouth every 4 (four) hours as needed for fever, headache or mild pain. 20 tablet 0   albuterol (VENTOLIN HFA) 108 (90 Base) MCG/ACT inhaler SMARTSIG:2 Puff(s) By Mouth Every 4 Hours PRN     amLODipine (NORVASC) 10 MG tablet Take 10 mg by mouth at bedtime.     apixaban (ELIQUIS) 5 MG TABS tablet Take 2.5 mg by mouth 2 (two)  times daily.     atorvastatin (LIPITOR) 80 MG tablet Take 1 tablet (80 mg total) by mouth daily. (Patient taking differently: Take 80 mg by mouth in the morning.) 30 tablet 3   brimonidine (ALPHAGAN) 0.2 % ophthalmic solution Place 1 drop into both eyes daily.     calcitRIOL (ROCALTROL) 0.25 MCG capsule Take 0.25 mcg by mouth every Monday, Wednesday, and Friday.     carvedilol (COREG) 12.5 MG tablet Take 1 tablet (12.5 mg total) by mouth 2 (two) times daily. 60 tablet 11   colchicine 0.6 MG tablet Take 0.5 tablets (0.3 mg total) by mouth daily. (Patient taking differently: Take 0.3 mg by mouth 2 (two) times daily.) 30 tablet 3   Daridorexant HCl 25  MG TABS Take 25 mg by mouth at bedtime.     DOCUSATE SODIUM PO Take 1 capsule by mouth daily.     esomeprazole (NEXIUM) 20 MG capsule Take 1 capsule (20 mg total) by mouth daily before breakfast. 30 capsule 3   ferrous sulfate 325 (65 FE) MG EC tablet Take 1 tablet (325 mg total) by mouth every other day. 45 tablet 3   folic acid (FOLVITE) 1 MG tablet Take 1 tablet (1 mg total) by mouth daily. (Patient taking differently: Take 1 mg by mouth at bedtime.)     furosemide (LASIX) 40 MG tablet Take 1 tablet (40 mg total) by mouth daily as needed for edema or fluid. (Patient taking differently: Take 40 mg by mouth daily.) 90 tablet 2   isosorbide mononitrate (IMDUR) 60 MG 24 hr tablet Take 1 tablet (60 mg total) by mouth daily. 60 tablet 1   melatonin 3 MG TABS tablet Take 3 mg by mouth at bedtime.     metoprolol succinate (TOPROL-XL) 100 MG 24 hr tablet Take 100 mg by mouth daily.     OLANZapine (ZYPREXA) 10 MG tablet Take 5-15 mg by mouth See admin instructions. Take 5 mg in the morning and 15 mg at night     potassium chloride (MICRO-K) 10 MEQ CR capsule Take 10 mEq by mouth 2 (two) times daily.     potassium chloride SA (KLOR-CON M) 20 MEQ tablet TAKE 4 TABLETS BY MOUTH TODAY THEN TAKE 2 TABLETS BY MOUTH EVERY DAY 100 tablet 0   sertraline (ZOLOFT) 100 MG tablet Take 100 mg by mouth daily.     sodium bicarbonate 650 MG tablet Take 650 mg by mouth 2 (two) times daily.     spironolactone (ALDACTONE) 50 MG tablet Take 50 mg by mouth daily.     No current facility-administered medications for this visit.     REVIEW OF SYSTEMS:   Constitutional: Denies fevers, chills or night sweats Eyes: Denies blurriness of vision Ears, nose, mouth, throat, and face: Denies mucositis or sore throat Respiratory: Denies cough, dyspnea or wheezes Cardiovascular: Denies palpitation, chest discomfort or lower extremity swelling Gastrointestinal:  Denies nausea, heartburn or change in bowel habits Skin: Denies  abnormal skin rashes Lymphatics: Denies new lymphadenopathy or easy bruising Neurological:Denies numbness, tingling or new weaknesses Behavioral/Psych: Mood is stable, no new changes  All other systems were reviewed with the patient and are negative.  PHYSICAL EXAMINATION:   Vitals:   05/28/23 1251  BP: (!) 151/81  Pulse: 74  Resp: 18  Temp: 98 F (36.7 C)  SpO2: 95%   GENERAL:alert, no distress and comfortable SKIN: skin color, texture, turgor are normal, no rashes or significant lesions LUNGS: clear to auscultation and percussion with normal breathing  effort HEART: regular rate & rhythm and no murmurs and no lower extremity edema ABDOMEN:abdomen soft, non-tender and normal bowel sounds Musculoskeletal:no cyanosis of digits and no clubbing  NEURO: alert & oriented x 3 with fluent speech  LABORATORY DATA:  I have reviewed the data as listed  Lab Results  Component Value Date   WBC 5.6 05/28/2023   NEUTROABS 8.5 (H) 04/28/2023   HGB 10.5 (L) 05/28/2023   HCT 33.6 (L) 05/28/2023   MCV 98.2 05/28/2023   PLT 98 (L) 05/28/2023       Chemistry      Component Value Date/Time   NA 139 04/28/2023 1354   NA 145 (H) 07/14/2022 0915   K 3.7 04/28/2023 1354   CL 111 04/28/2023 1354   CO2 18 (L) 04/28/2023 1354   BUN 29 (H) 04/28/2023 1354   BUN 49 (H) 07/14/2022 0915   CREATININE 2.85 (H) 04/28/2023 1354   CREATININE 4.47 (H) 11/22/2020 1348      Component Value Date/Time   CALCIUM 8.7 (L) 04/28/2023 1354   ALKPHOS 89 04/28/2023 1354   AST 18 04/28/2023 1354   ALT 14 04/28/2023 1354   BILITOT 0.4 04/28/2023 1354   BILITOT 0.4 07/14/2022 0915      Latest Reference Range & Units 04/28/23 13:53  Iron 45 - 182 ug/dL 50  UIBC ug/dL 865  TIBC 784 - 696 ug/dL 295  Saturation Ratios 17.9 - 39.5 % 19  Ferritin 24 - 336 ng/mL 141

## 2023-05-28 NOTE — Progress Notes (Signed)
 Pt.'s Hgb 10.5. Per MD to hold Retacrit today.Pt updated and all questions answered.   Glenn Hawkins [employer]

## 2023-05-28 NOTE — Assessment & Plan Note (Addendum)
 Patient with a history of anemia chronic kidney disease.  Patient previously received EPO injections for anemia but stopped due to improvement in hemoglobin levels.  Hemoglobin 10.5 today.  Patient has not received erythropoietin shots since 12/24 -No indication for EPO at this time -Will recheck CBC in 2 months.  Will administer EPO shot if hemoglobin is less than 10.  RTC in 2 months

## 2023-06-06 DIAGNOSIS — I252 Old myocardial infarction: Secondary | ICD-10-CM | POA: Diagnosis not present

## 2023-06-06 DIAGNOSIS — R600 Localized edema: Secondary | ICD-10-CM | POA: Diagnosis not present

## 2023-06-06 DIAGNOSIS — Z87891 Personal history of nicotine dependence: Secondary | ICD-10-CM | POA: Diagnosis not present

## 2023-06-06 DIAGNOSIS — I130 Hypertensive heart and chronic kidney disease with heart failure and stage 1 through stage 4 chronic kidney disease, or unspecified chronic kidney disease: Secondary | ICD-10-CM | POA: Diagnosis not present

## 2023-06-06 DIAGNOSIS — N189 Chronic kidney disease, unspecified: Secondary | ICD-10-CM | POA: Diagnosis not present

## 2023-06-06 DIAGNOSIS — I509 Heart failure, unspecified: Secondary | ICD-10-CM | POA: Diagnosis not present

## 2023-06-08 DIAGNOSIS — R809 Proteinuria, unspecified: Secondary | ICD-10-CM | POA: Diagnosis not present

## 2023-06-08 DIAGNOSIS — D631 Anemia in chronic kidney disease: Secondary | ICD-10-CM | POA: Diagnosis not present

## 2023-06-08 DIAGNOSIS — N189 Chronic kidney disease, unspecified: Secondary | ICD-10-CM | POA: Diagnosis not present

## 2023-06-09 DIAGNOSIS — I130 Hypertensive heart and chronic kidney disease with heart failure and stage 1 through stage 4 chronic kidney disease, or unspecified chronic kidney disease: Secondary | ICD-10-CM | POA: Diagnosis not present

## 2023-06-09 DIAGNOSIS — N184 Chronic kidney disease, stage 4 (severe): Secondary | ICD-10-CM | POA: Diagnosis not present

## 2023-06-09 DIAGNOSIS — D649 Anemia, unspecified: Secondary | ICD-10-CM | POA: Diagnosis not present

## 2023-06-09 DIAGNOSIS — R9431 Abnormal electrocardiogram [ECG] [EKG]: Secondary | ICD-10-CM | POA: Diagnosis not present

## 2023-06-09 DIAGNOSIS — R7989 Other specified abnormal findings of blood chemistry: Secondary | ICD-10-CM | POA: Diagnosis not present

## 2023-06-09 DIAGNOSIS — Z7982 Long term (current) use of aspirin: Secondary | ICD-10-CM | POA: Diagnosis not present

## 2023-06-09 DIAGNOSIS — Z7901 Long term (current) use of anticoagulants: Secondary | ICD-10-CM | POA: Diagnosis not present

## 2023-06-09 DIAGNOSIS — M7989 Other specified soft tissue disorders: Secondary | ICD-10-CM | POA: Diagnosis not present

## 2023-06-09 DIAGNOSIS — I252 Old myocardial infarction: Secondary | ICD-10-CM | POA: Diagnosis not present

## 2023-06-09 DIAGNOSIS — Z79899 Other long term (current) drug therapy: Secondary | ICD-10-CM | POA: Diagnosis not present

## 2023-06-09 DIAGNOSIS — E8770 Fluid overload, unspecified: Secondary | ICD-10-CM | POA: Diagnosis not present

## 2023-06-09 DIAGNOSIS — I509 Heart failure, unspecified: Secondary | ICD-10-CM | POA: Diagnosis not present

## 2023-06-09 DIAGNOSIS — R600 Localized edema: Secondary | ICD-10-CM | POA: Diagnosis not present

## 2023-06-09 DIAGNOSIS — R9389 Abnormal findings on diagnostic imaging of other specified body structures: Secondary | ICD-10-CM | POA: Diagnosis not present

## 2023-06-09 DIAGNOSIS — R140 Abdominal distension (gaseous): Secondary | ICD-10-CM | POA: Diagnosis not present

## 2023-06-09 DIAGNOSIS — R0602 Shortness of breath: Secondary | ICD-10-CM | POA: Diagnosis not present

## 2023-06-10 ENCOUNTER — Encounter: Payer: Medicare Other | Admitting: Internal Medicine

## 2023-06-10 DIAGNOSIS — I129 Hypertensive chronic kidney disease with stage 1 through stage 4 chronic kidney disease, or unspecified chronic kidney disease: Secondary | ICD-10-CM | POA: Diagnosis not present

## 2023-06-10 DIAGNOSIS — D631 Anemia in chronic kidney disease: Secondary | ICD-10-CM | POA: Diagnosis not present

## 2023-06-10 DIAGNOSIS — N2581 Secondary hyperparathyroidism of renal origin: Secondary | ICD-10-CM | POA: Diagnosis not present

## 2023-06-10 DIAGNOSIS — N184 Chronic kidney disease, stage 4 (severe): Secondary | ICD-10-CM | POA: Diagnosis not present

## 2023-06-30 ENCOUNTER — Ambulatory Visit: Payer: Medicare Other | Admitting: Cardiovascular Disease

## 2023-07-22 ENCOUNTER — Inpatient Hospital Stay: Attending: Hematology

## 2023-07-22 DIAGNOSIS — D631 Anemia in chronic kidney disease: Secondary | ICD-10-CM | POA: Insufficient documentation

## 2023-07-22 DIAGNOSIS — N183 Chronic kidney disease, stage 3 unspecified: Secondary | ICD-10-CM | POA: Insufficient documentation

## 2023-07-22 DIAGNOSIS — D509 Iron deficiency anemia, unspecified: Secondary | ICD-10-CM | POA: Diagnosis present

## 2023-07-22 DIAGNOSIS — D696 Thrombocytopenia, unspecified: Secondary | ICD-10-CM | POA: Diagnosis not present

## 2023-07-22 LAB — CBC WITH DIFFERENTIAL/PLATELET
Abs Immature Granulocytes: 0.04 10*3/uL (ref 0.00–0.07)
Basophils Absolute: 0 10*3/uL (ref 0.0–0.1)
Basophils Relative: 0 %
Eosinophils Absolute: 0.3 10*3/uL (ref 0.0–0.5)
Eosinophils Relative: 6 %
HCT: 37.7 % — ABNORMAL LOW (ref 39.0–52.0)
Hemoglobin: 11.8 g/dL — ABNORMAL LOW (ref 13.0–17.0)
Immature Granulocytes: 1 %
Lymphocytes Relative: 22 %
Lymphs Abs: 1.3 10*3/uL (ref 0.7–4.0)
MCH: 30.4 pg (ref 26.0–34.0)
MCHC: 31.3 g/dL (ref 30.0–36.0)
MCV: 97.2 fL (ref 80.0–100.0)
Monocytes Absolute: 0.4 10*3/uL (ref 0.1–1.0)
Monocytes Relative: 7 %
Neutro Abs: 3.8 10*3/uL (ref 1.7–7.7)
Neutrophils Relative %: 64 %
Platelets: 105 10*3/uL — ABNORMAL LOW (ref 150–400)
RBC: 3.88 MIL/uL — ABNORMAL LOW (ref 4.22–5.81)
RDW: 13.2 % (ref 11.5–15.5)
WBC: 5.9 10*3/uL (ref 4.0–10.5)
nRBC: 0 % (ref 0.0–0.2)

## 2023-07-22 LAB — COMPREHENSIVE METABOLIC PANEL WITH GFR
ALT: 11 U/L (ref 0–44)
AST: 12 U/L — ABNORMAL LOW (ref 15–41)
Albumin: 3.7 g/dL (ref 3.5–5.0)
Alkaline Phosphatase: 102 U/L (ref 38–126)
Anion gap: 9 (ref 5–15)
BUN: 61 mg/dL — ABNORMAL HIGH (ref 8–23)
CO2: 17 mmol/L — ABNORMAL LOW (ref 22–32)
Calcium: 8.8 mg/dL — ABNORMAL LOW (ref 8.9–10.3)
Chloride: 114 mmol/L — ABNORMAL HIGH (ref 98–111)
Creatinine, Ser: 4.58 mg/dL — ABNORMAL HIGH (ref 0.61–1.24)
GFR, Estimated: 13 mL/min — ABNORMAL LOW (ref >60)
Glucose, Bld: 125 mg/dL — ABNORMAL HIGH (ref 70–99)
Potassium: 4.3 mmol/L (ref 3.5–5.1)
Sodium: 140 mmol/L (ref 135–145)
Total Bilirubin: 0.4 mg/dL (ref 0.0–1.2)
Total Protein: 6.8 g/dL (ref 6.5–8.1)

## 2023-07-22 LAB — IRON AND TIBC
Iron: 97 ug/dL (ref 45–182)
Saturation Ratios: 34 % (ref 17.9–39.5)
TIBC: 285 ug/dL (ref 250–450)
UIBC: 188 ug/dL

## 2023-07-22 LAB — FERRITIN: Ferritin: 83 ng/mL (ref 24–336)

## 2023-07-22 LAB — VITAMIN B12: Vitamin B-12: 398 pg/mL (ref 180–914)

## 2023-07-22 LAB — FOLATE: Folate: >40 ng/mL (ref >5.9)

## 2023-07-23 DIAGNOSIS — D631 Anemia in chronic kidney disease: Secondary | ICD-10-CM | POA: Diagnosis not present

## 2023-07-23 DIAGNOSIS — E211 Secondary hyperparathyroidism, not elsewhere classified: Secondary | ICD-10-CM | POA: Diagnosis not present

## 2023-07-23 DIAGNOSIS — R809 Proteinuria, unspecified: Secondary | ICD-10-CM | POA: Diagnosis not present

## 2023-07-23 DIAGNOSIS — N189 Chronic kidney disease, unspecified: Secondary | ICD-10-CM | POA: Diagnosis not present

## 2023-07-28 ENCOUNTER — Ambulatory Visit: Attending: Cardiovascular Disease | Admitting: Cardiovascular Disease

## 2023-07-28 ENCOUNTER — Encounter: Payer: Self-pay | Admitting: Cardiovascular Disease

## 2023-07-28 VITALS — BP 136/78 | HR 61 | Ht 68.0 in | Wt 204.6 lb

## 2023-07-28 DIAGNOSIS — I739 Peripheral vascular disease, unspecified: Secondary | ICD-10-CM

## 2023-07-28 DIAGNOSIS — I10 Essential (primary) hypertension: Secondary | ICD-10-CM | POA: Diagnosis not present

## 2023-07-28 DIAGNOSIS — E785 Hyperlipidemia, unspecified: Secondary | ICD-10-CM

## 2023-07-28 DIAGNOSIS — I2510 Atherosclerotic heart disease of native coronary artery without angina pectoris: Secondary | ICD-10-CM

## 2023-07-28 DIAGNOSIS — Z86718 Personal history of other venous thrombosis and embolism: Secondary | ICD-10-CM

## 2023-07-28 DIAGNOSIS — I5022 Chronic systolic (congestive) heart failure: Secondary | ICD-10-CM

## 2023-07-28 MED ORDER — CARVEDILOL 25 MG PO TABS: 25.0000 mg | ORAL_TABLET | Freq: Two times a day (BID) | ORAL | 1 refills | Status: DC

## 2023-07-28 NOTE — Progress Notes (Signed)
 Cardiology Office Note   Date:  07/28/2023   ID:  Glenn Hawkins, DOB 1955/07/31, MRN [id number]  PCP:  Glenn Bitters, MD  Cardiologist:   Glenn Kirks, MD   No chief complaint on file.      History of Present Illness: Glenn Hawkins is a 68 y.o. male who presents for a followup visit regarding coronary artery disease, chronic systolic heart failure and mitral regurgitation.  He has known history of coronary artery disease status post myocardial infarction with late presentation in 2013. He had a nuclear stress test in January of 2013 which showed mostly a large transmural infarct in the left circumflex distribution without significant ischemia. EF was 40-45% with moderate mitral regurgitation.  He had extensive right leg DVT in August 2016 and has been on anticoagulation since then. He also has  bilateral calf claudication due to suspected inflow disease.  He was hospitalized in March of 2024 with heart failure and improved with diuresis.  Echocardiogram showed an EF of 35 to 40% with severe mitral regurgitation.  He underwent a right and left cardiac catheterization in May which showed subtotal occlusion in the mid left circumflex and mid right coronary artery with collaterals from the LAD.  He presented for mitral valve clip  but TEE showed that his mitral regurgitation was only mild at that time and thus the case was canceled.  He was hospitalized in 2024 with acute on chronic kidney disease in the setting of anorexia and diarrhea.  Fortunately, his renal function improved with hydration.  Jardiance  and spironolactone  were discontinued.  In addition, his furosemide  was suspended but subsequently resumed as an outpatient.    He has been doing reasonably well with no chest pain or worsening dyspnea.  No orthopnea or lower extremity edema.  There has been some progression of his chronic kidney disease with most recent GFR of 13.     Past Medical History:  Diagnosis Date   Aortic  insufficiency    a. 08/2019 Echo: mild to mod AI.   C. 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 02/2011 Late presentation MI-->Myoview  w/ large area of transmural infarct in LCX distribution, no ischemia.   Depressive disorder, not elsewhere classified    Dyspnea    Esophageal reflux    Gout, unspecified    Heart murmur    Mitral valve   Hemorrhoids    a. 01/2016 s/p hemorrhoidectomy.   HFrEF (heart failure with reduced ejection fraction) (HCC)    a. 04/2012 Echo: EF 40%, inflat AK, Gr1 DD, mild AI/MR, triv TR; b. 11/2017 EchoP EF 30-35%, inflat HK; c. 08/2019 Echo: EF 30-35%, gr1 DD, inflat AK.   History of DVT (deep vein thrombosis)    a. Chronic Eliquis .   Hyperlipidemia LDL goal <70    Hypertension    Hypotension, unspecified    Ischemic cardiomyopathy    a. 04/2012 Echo: EF 40%, inflat AK, Gr1 DD, mild AI/MR, triv TR; b. 11/2017 EchoP EF 30-35%, inflat HK; c. 08/2019 Echo: EF 30-35%, gr1 DD, inflat AK. Nl RV size/fxn. Mild MR. Mild to mod AI.   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 04/2012 Echo: EF 40%, mild MR.   Myocardial infarction (lateral wall) (HCC) 2013   a. 02/2011 -  late presentation. Managed conservatively 2/2 CKD;  b. 02/2011 Myoview : Large transmural infarct in the LCX distribution, no ischemia.   PAD (peripheral artery disease) (HCC)    a. 08/2015 ABI/duplex: R: 0.86, L 0.96. Duplex w/ bilateral heterogeneous plaque in mid fem arteries, no significant stenoses; b. 08/2019 ABI/Duplex: prob bilat inflow dzs w/ stable ABIs (R 0.85, L 0.93).   Personal history of tobacco use, presenting hazards to health    Torn ligament    Unspecified schizophrenia, unspecified condition     Past Surgical History:  Procedure Laterality Date   BIOPSY  12/17/2020   Procedure: BIOPSY;   Surgeon: Vinetta Greening, DO;  Location: [facility];  Service: Endoscopy;;   COLONOSCOPY     COLONOSCOPY N/A 11/10/2014   Procedure: COLONOSCOPY;  Surgeon: Danette Duos, MD;  Location: [facility] ENDOSCOPY;  Service: Gastroenterology;  Laterality: N/A;   COLONOSCOPY WITH PROPOFOL  N/A 05/08/2015   Surgeon: Alyce Jubilee, MD; nonthrombosed external hemorrhoids, one 8 mm tubular adenoma, one 4 mm tubular adenoma.  Repeat colonoscopy in 5-10 years.   COLONOSCOPY WITH PROPOFOL  N/A 12/17/2020   Procedure: COLONOSCOPY WITH PROPOFOL ;  Surgeon: Vinetta Greening, DO;  Location: [facility];  Service: Endoscopy;  Laterality: N/A;  8:30am   ESOPHAGOGASTRODUODENOSCOPY (EGD) WITH PROPOFOL  N/A 05/08/2015   Surgeon: Alyce Jubilee, MD; LA grade a reflux esophagitis, normal stomach and duodenum.   ESOPHAGOGASTRODUODENOSCOPY (EGD) WITH PROPOFOL  N/A 12/17/2020   Procedure: ESOPHAGOGASTRODUODENOSCOPY (EGD) WITH PROPOFOL ;  Surgeon: Vinetta Greening, DO;  Location: [facility];  Service: Endoscopy;  Laterality: N/A;   EYE SURGERY Right    removal of foreign body   HEMORRHOID SURGERY N/A 02/20/2016   Procedure: EXTENSIVE HEMORRHOIDECTOMY;  Surgeon: Alanda Allegra, MD;  Location: [facility] ORS;  Service: General;  Laterality: N/A;   None     POLYPECTOMY  05/08/2015   Procedure: POLYPECTOMY;  Surgeon: Alyce Jubilee, MD;  Location: [facility];  Service: Endoscopy;;  transverse colon polyp   RIGHT/LEFT HEART CATH AND CORONARY ANGIOGRAPHY N/A 07/03/2022   Procedure: RIGHT/LEFT HEART CATH AND CORONARY ANGIOGRAPHY;  Surgeon: Arnoldo Lapping, MD;  Location: [facility] INVASIVE CV LAB;  Service: Cardiovascular;  Laterality: N/A;   TEE WITHOUT CARDIOVERSION N/A 05/26/2022   Procedure: TRANSESOPHAGEAL ECHOCARDIOGRAM (TEE);  Surgeon: Loyde Rule, MD;  Location: [facility] ENDOSCOPY;  Service: Cardiovascular;  Laterality: N/A;   TEE WITHOUT CARDIOVERSION N/A 07/16/2022   Procedure: TRANSESOPHAGEAL ECHOCARDIOGRAM;  Surgeon: Arnoldo Lapping, MD;  Location: [facility] INVASIVE CV LAB;  Service: Cardiovascular;  Laterality: N/A;   TRANSCATHETER MITRAL EDGE TO EDGE REPAIR N/A 07/16/2022   Procedure: MITRAL VALVE REPAIR;  Surgeon: Arnoldo Lapping, MD;  Location: [facility] INVASIVE CV LAB;  Service: Cardiovascular;  Laterality: N/A;     Current Outpatient Medications  Medication Sig Dispense Refill   acetaminophen  (TYLENOL ) 325 MG tablet Take 2 tablets (650 mg total) by mouth every 4 (four) hours as needed for fever, headache or mild pain. 20 tablet 0   albuterol  (VENTOLIN  HFA) 108 (90 Base) MCG/ACT inhaler SMARTSIG:2 Puff(s) By Mouth Every 4 Hours PRN     amLODipine  (NORVASC ) 10 MG tablet Take 10 mg by mouth at bedtime.     apixaban  (ELIQUIS ) 5 MG TABS tablet Take 2.5 mg by mouth 2 (two) times daily.     atorvastatin  (LIPITOR ) 80 MG tablet Take 1 tablet (80 mg total) by mouth daily. (Patient taking differently: Take 80 mg by mouth in the morning.) 30 tablet 3   brimonidine  (ALPHAGAN )  0.2 % ophthalmic solution Place 1 drop into both eyes daily.     carvedilol  (COREG ) 12.5 MG tablet Take 1 tablet (12.5 mg total) by mouth 2 (two) times daily. 60 tablet 11   colchicine  0.6 MG tablet Take 0.5 tablets (0.3 mg total) by mouth daily. (Patient taking differently: Take 0.3 mg by mouth 2 (two) times daily.) 30 tablet 3   Daridorexant  HCl 25 MG TABS Take 25 mg by mouth at bedtime.     DOCUSATE SODIUM  PO Take 1 capsule by mouth daily.     esomeprazole  (NEXIUM ) 20 MG capsule Take 1 capsule (20 mg total) by mouth daily before breakfast. 30 capsule 3   ferrous sulfate  325 (65 FE) MG EC tablet Take 1 tablet (325 mg total) by mouth every other day. 45 tablet 3   folic acid  (FOLVITE ) 1 MG tablet Take 1 tablet (1 mg total) by mouth daily. (Patient taking differently: Take 1 mg by mouth at bedtime.)     furosemide  (LASIX ) 40 MG tablet Take 1 tablet (40 mg total) by mouth daily as needed for edema or fluid. (Patient taking differently: Take 40 mg by mouth daily.)  90 tablet 2   isosorbide  mononitrate (IMDUR ) 60 MG 24 hr tablet Take 1 tablet (60 mg total) by mouth daily. 60 tablet 1   melatonin 3 MG TABS tablet Take 3 mg by mouth at bedtime.     metoprolol  succinate (TOPROL -XL) 100 MG 24 hr tablet Take 100 mg by mouth daily.     OLANZapine  (ZYPREXA ) 10 MG tablet Take 5-15 mg by mouth See admin instructions. Take 5 mg in the morning and 15 mg at night     potassium chloride  (MICRO-K ) 10 MEQ CR capsule Take 10 mEq by mouth 2 (two) times daily.     potassium chloride  SA (KLOR-CON  M) 20 MEQ tablet TAKE 4 TABLETS BY MOUTH TODAY THEN TAKE 2 TABLETS BY MOUTH EVERY DAY 100 tablet 0   sertraline  (ZOLOFT ) 100 MG tablet Take 100 mg by mouth daily.     sodium bicarbonate  650 MG tablet Take 650 mg by mouth 2 (two) times daily.     spironolactone  (ALDACTONE ) 50 MG tablet Take 50 mg by mouth daily.     No current facility-administered medications for this visit.    Allergies:   Nifedipine, Aciphex [rabeprazole sodium], Benazepril, Haloperidol, and Prilosec [omeprazole]    Social History:  The patient  reports that he quit smoking about 27 years ago. His smoking use included cigarettes. He started smoking about 50 years ago. He has a 23 pack-year smoking history. He has never been exposed to tobacco smoke. He has never used smokeless tobacco. He reports that he does not currently use drugs after having used the following drugs: Cocaine. He reports that he does not drink alcohol .   Family History:  The patient's family history includes Crohn's disease in his sister.    ROS:  Please see the history of present illness.   Otherwise, review of systems are positive for none.   All other systems are reviewed and negative.    PHYSICAL EXAM: VS:  BP 136/78 (BP Location: Left Arm, Patient Position: Sitting)   Pulse 61   Ht 5\' 8"  (1.727 m)   Wt 204 lb 9.6 oz (92.8 kg)   SpO2 99%   BMI 31.11 kg/m  , BMI Body mass index is 31.11 kg/m. GEN: Well nourished, well developed,  in no acute distress  HEENT: normal  Neck: no JVD, carotid bruits,  or masses Cardiac: RRR; no murmurs, rubs, or gallops,no edema  Respiratory:  clear to auscultation bilaterally, normal work of breathing GI: soft, nontender, nondistended, + BS MS: no deformity or atrophy  Skin: warm and dry, no rash Neuro:  Strength and sensation are intact Psych: euthymic mood, full affect    EKG:  EKG is ordered today. The ekg ordered today demonstrates: Normal sinus rhythm Minimal voltage criteria for LVH, may be normal variant ( Sokolow-Lyon ) T wave abnormality, consider lateral ischemia Prolonged QT When compared with ECG of 08-Jan-2023 13:26, Nonspecific T wave abnormality now evident in Inferior leads   Recent Labs: 01/08/2023: B Natriuretic Peptide 769.3; Magnesium  1.9 07/22/2023: ALT 11; BUN 61; Creatinine, Ser 4.58; Hemoglobin 11.8; Platelets 105; Potassium 4.3; Sodium 140    Lipid Panel    Component Value Date/Time   CHOL 109 07/04/2022 1007   CHOL 191 08/02/2019 1515   TRIG 62 07/04/2022 1007   HDL 42 07/04/2022 1007   HDL 37 (L) 08/02/2019 1515   CHOLHDL 2.6 07/04/2022 1007   VLDL 12 07/04/2022 1007   LDLCALC 55 07/04/2022 1007   LDLCALC 123 (H) 08/02/2019 1515   LDLDIRECT 125 (H) 08/02/2019 1515      Wt Readings from Last 3 Encounters:  07/28/23 204 lb 9.6 oz (92.8 kg)  05/28/23 211 lb 3.2 oz (95.8 kg)  03/19/23 199 lb 12.8 oz (90.6 kg)       ASSESSMENT AND PLAN:  1.  Coronary artery disease involving native coronary arteries without angina: He continues to be clinically stable with no new symptoms.  Continue medical therapy.  Both right coronary artery and left circumflex are occluded with collaterals.  No obstructive disease affecting the LAD.  No aspirin  given that he is on Eliquis .  2. Chronic systolic heart failure: Most recent ejection fraction was 40 to 45%.  For some unclear reasons, he is taking both Toprol  and carvedilol .  I elected to stop Toprol  and  increase carvedilol  to 25 mg twice daily. He is also on spironolactone  but that is now contraindicated in the setting of GFR of 13.  I discontinued spironolactone  today. Continue Imdur  and amlodipine .  Consider adding hydralazine  for additional blood pressure control if needed.  3. Essential hypertension: Blood pressure is reasonably controlled on current medications.  I made changes as outlined above.  4. History of right leg DVT: Currently on anticoagulation with low-dose Eliquis .  5. Hyperlipidemia: Continue atorvastatin .  Most recent lipid profile showed an LDL of 55.  6. Peripheral arterial disease: The patient likely has severe aortoiliac disease but he reports stable mild bilateral calf claudication which is currently not lifestyle limiting.  Continue therapy for now.  7.  Mitral regurgitation: This was mild on transesophageal echocardiogram.  No murmurs by exam today.   Disposition:   FU with APP in 4 months.  Signed,  Glenn Kirks, MD  07/28/2023 10:44 AM    [facility]

## 2023-07-28 NOTE — Patient Instructions (Signed)
 Medication Instructions:  Your physician recommends the following medication changes.  STOP TAKING: Metoprolol  Spironolactone    INCREASE: Carvedilol  to 25 mg twice daily  *If you need a refill on your cardiac medications before your next appointment, please call your pharmacy*  Lab Work: None ordered If you have labs (blood work) drawn today and your tests are completely normal, you will receive your results only by: MyChart Message (if you have MyChart) OR A paper copy in the mail If you have any lab test that is abnormal or we need to change your treatment, we will call you to review the results.  Testing/Procedures: None ordered  Follow-Up: At [facility], you and your health needs are our priority.  As part of our continuing mission to provide you with exceptional heart care, our providers are all part of one team.  This team includes your primary Cardiologist (physician) and Advanced Practice Providers or APPs (Physician Assistants and Nurse Practitioners) who all work together to provide you with the care you need, when you need it.  Your next appointment:   4 month(s)  Provider:   Antionette Kirks, MD    We recommend signing up for the patient portal called "MyChart".  Sign up information is provided on this After Visit Summary.  MyChart is used to connect with patients for Virtual Visits (Telemedicine).  Patients are able to view lab/test results, encounter notes, upcoming appointments, etc.  Non-urgent messages can be sent to your provider as well.   To learn more about what you can do with MyChart, go to [email or web address].

## 2023-07-29 ENCOUNTER — Inpatient Hospital Stay: Admitting: Oncology

## 2023-07-30 ENCOUNTER — Inpatient Hospital Stay: Attending: Oncology | Admitting: Oncology

## 2023-07-30 VITALS — BP 143/75 | HR 74 | Temp 98.2°F | Resp 18 | Wt 206.6 lb

## 2023-07-30 DIAGNOSIS — D696 Thrombocytopenia, unspecified: Secondary | ICD-10-CM | POA: Insufficient documentation

## 2023-07-30 DIAGNOSIS — D509 Iron deficiency anemia, unspecified: Secondary | ICD-10-CM | POA: Diagnosis not present

## 2023-07-30 DIAGNOSIS — D631 Anemia in chronic kidney disease: Secondary | ICD-10-CM | POA: Insufficient documentation

## 2023-07-30 DIAGNOSIS — E538 Deficiency of other specified B group vitamins: Secondary | ICD-10-CM | POA: Insufficient documentation

## 2023-07-30 DIAGNOSIS — N189 Chronic kidney disease, unspecified: Secondary | ICD-10-CM | POA: Diagnosis not present

## 2023-07-30 DIAGNOSIS — N184 Chronic kidney disease, stage 4 (severe): Secondary | ICD-10-CM | POA: Diagnosis not present

## 2023-07-30 MED ORDER — VITAMIN B-12 1000 MCG PO TABS: 1000.0000 ug | ORAL_TABLET | Freq: Every day | ORAL | 2 refills | Status: AC

## 2023-07-30 NOTE — Assessment & Plan Note (Signed)
 Very mild iron  deficiency previously. Tsat within normal limits at this time.  -Continue oral ferrous sulfate  every other day

## 2023-07-30 NOTE — Assessment & Plan Note (Signed)
 Chronic low platelet count, cause unknown. No known liver disease or alcohol use. Normal liver and spleen on previous MRI and CT abdomen. HIV and hepatitis panel negative. 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  -Improved from prior -No indication for transfusion or treatment at this time

## 2023-07-30 NOTE — Assessment & Plan Note (Signed)
 Patient with a history of anemia chronic kidney disease.  Patient previously received EPO injections for anemia but stopped due to improvement in hemoglobin levels.  Hemoglobin 11.8 recently.  Patient has not received erythropoietin shots since 12/24  -No indication for EPO at this time -Will recheck CBC in 3 months.  Will administer EPO shot if hemoglobin is less than 10.  RTC in 3 months

## 2023-07-30 NOTE — Progress Notes (Signed)
  [facility]  HEMATOLOGY FOLLOW-UP VISIT  [name], MD  REASON FOR FOLLOW-UP: Anemia of chronic kidney disease  ASSESSMENT & PLAN:   Anemia in chronic kidney disease Patient with a history of anemia chronic kidney disease.  Patient previously received EPO injections for anemia but stopped due to improvement in hemoglobin levels.  Hemoglobin 11.8 recently.  Patient has not received erythropoietin shots since 12/24  -No indication for EPO at this time -Will recheck CBC in 3 months.  Will administer EPO shot if hemoglobin is less than 10.  RTC in 3 months  Thrombocytopenia (HCC) Chronic low platelet count, cause unknown. No known liver disease or alcohol  use. Normal liver and spleen on previous MRI and CT abdomen. HIV and hepatitis panel negative. 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   -Improved from prior -No indication for transfusion or treatment at this time  Iron  deficiency anemia Very mild iron  deficiency previously. Tsat within normal limits at this time.  -Continue oral ferrous sulfate  every other day  Vitamin B12 deficiency Patient has mild vitamin B12 deficiency with levels less than 400  - Start oral vitamin B12 1000 mcg daily    Orders Placed This Encounter  Procedures   Ferritin    Standing Status:   Future    Expected Date:   10/26/2023    Expiration Date:   07/29/2024   Folate    Standing Status:   Future    Expected Date:   10/26/2023    Expiration Date:   07/29/2024   Vitamin B12    Standing Status:   Future    Expected Date:   10/26/2023    Expiration Date:   07/29/2024   CBC with Differential/Platelet    Standing Status:   Future    Expected Date:   10/26/2023    Expiration Date:   07/29/2024   Comprehensive metabolic panel with GFR    Standing Status:   Future    Expected Date:   10/26/2023    Expiration Date:   07/29/2024   Iron  and TIBC    Standing Status:   Future     Expected Date:   10/26/2023    Expiration Date:   07/29/2024    The total time spent in the appointment was 15 minutes encounter with patients including review of chart and various tests results, discussions about plan of care and coordination of care plan  All questions were answered. The patient knows to call the clinic with any problems, questions or concerns. No barriers to learning was detected.  Eduardo Grade, MD 6/5/20253:43 PM   INTERVAL HISTORY: Glenn Hawkins DAILY 68 y.o. male following for anemia of chronic kidney disease.  Patient also has thrombocytopenia of unknown cause for several years and has been stable so far. He reports feeling well and has no new complaints.  Denies rashes, petechiae, bleeding.  I have reviewed the past medical history, past surgical history, social history and family history with the patient   ALLERGIES:  is allergic to nifedipine, aciphex [rabeprazole sodium], benazepril, haloperidol, and prilosec [omeprazole].  MEDICATIONS:  Current Outpatient Medications  Medication Sig Dispense Refill   acetaminophen  (TYLENOL ) 325 MG tablet Take 2 tablets (650 mg total) by mouth every 4 (four) hours as needed for fever, headache or mild pain. 20 tablet 0   albuterol  (VENTOLIN  HFA) 108 (90 Base) MCG/ACT inhaler SMARTSIG:2 Puff(s) By Mouth Every 4 Hours PRN  amLODipine  (NORVASC ) 10 MG tablet Take 10 mg by mouth at bedtime.     apixaban  (ELIQUIS ) 5 MG TABS tablet Take 2.5 mg by mouth 2 (two) times daily.     atorvastatin  (LIPITOR ) 80 MG tablet Take 1 tablet (80 mg total) by mouth daily. (Patient taking differently: Take 80 mg by mouth in the morning.) 30 tablet 3   brimonidine  (ALPHAGAN ) 0.2 % ophthalmic solution Place 1 drop into both eyes daily.     carvedilol  (COREG ) 25 MG tablet Take 1 tablet (25 mg total) by mouth 2 (two) times daily. 180 tablet 1   colchicine  0.6 MG tablet Take 0.5 tablets (0.3 mg total) by mouth daily. (Patient taking differently: Take 0.3 mg by  mouth 2 (two) times daily.) 30 tablet 3   cyanocobalamin  (VITAMIN B12) 1000 MCG tablet Take 1 tablet (1,000 mcg total) by mouth daily. 90 tablet 2   Daridorexant  HCl 25 MG TABS Take 25 mg by mouth at bedtime.     DOCUSATE SODIUM  PO Take 1 capsule by mouth daily.     esomeprazole  (NEXIUM ) 20 MG capsule Take 1 capsule (20 mg total) by mouth daily before breakfast. 30 capsule 3   ferrous sulfate  325 (65 FE) MG EC tablet Take 1 tablet (325 mg total) by mouth every other day. 45 tablet 3   folic acid  (FOLVITE ) 1 MG tablet Take 1 tablet (1 mg total) by mouth daily. (Patient taking differently: Take 1 mg by mouth at bedtime.)     furosemide  (LASIX ) 40 MG tablet Take 1 tablet (40 mg total) by mouth daily as needed for edema or fluid. (Patient taking differently: Take 40 mg by mouth daily.) 90 tablet 2   isosorbide  mononitrate (IMDUR ) 60 MG 24 hr tablet Take 1 tablet (60 mg total) by mouth daily. 60 tablet 1   melatonin 3 MG TABS tablet Take 3 mg by mouth at bedtime.     OLANZapine  (ZYPREXA ) 10 MG tablet Take 5-15 mg by mouth See admin instructions. Take 5 mg in the morning and 15 mg at night     potassium chloride  (MICRO-K ) 10 MEQ CR capsule Take 10 mEq by mouth 2 (two) times daily.     potassium chloride  SA (KLOR-CON  M) 20 MEQ tablet TAKE 4 TABLETS BY MOUTH TODAY THEN TAKE 2 TABLETS BY MOUTH EVERY DAY 100 tablet 0   sertraline  (ZOLOFT ) 100 MG tablet Take 100 mg by mouth daily.     sodium bicarbonate  650 MG tablet Take 650 mg by mouth 2 (two) times daily.     No current facility-administered medications for this visit.     REVIEW OF SYSTEMS:   Constitutional: Denies fevers, chills or night sweats Eyes: Denies blurriness of vision Ears, nose, mouth, throat, and face: Denies mucositis or sore throat Respiratory: Denies cough, dyspnea or wheezes Cardiovascular: Denies palpitation, chest discomfort or lower extremity swelling Gastrointestinal:  Denies nausea, heartburn or change in bowel habits Skin:  Denies abnormal skin rashes Lymphatics: Denies new lymphadenopathy or easy bruising Neurological:Denies numbness, tingling or new weaknesses Behavioral/Psych: Mood is stable, no new changes  All other systems were reviewed with the patient and are negative.  PHYSICAL EXAMINATION:   Vitals:   07/30/23 1435  BP: (!) 143/75  Pulse: 74  Resp: 18  Temp: 98.2 F (36.8 C)  SpO2: 99%   GENERAL:alert, no distress and comfortable SKIN: skin color, texture, turgor are normal, no rashes or significant lesions LUNGS: clear to auscultation and percussion with normal breathing effort HEART: regular  rate & rhythm and no murmurs and no lower extremity edema ABDOMEN:abdomen soft, non-tender and normal bowel sounds Musculoskeletal:no cyanosis of digits and no clubbing  NEURO: alert & oriented x 3 with fluent speech  LABORATORY DATA:  I have reviewed the data as listed  Lab Results  Component Value Date   WBC 5.9 07/22/2023   NEUTROABS 3.8 07/22/2023   HGB 11.8 (L) 07/22/2023   HCT 37.7 (L) 07/22/2023   MCV 97.2 07/22/2023   PLT 105 (L) 07/22/2023       Chemistry      Component Value Date/Time   NA 140 07/22/2023 1336   NA 145 (H) 07/14/2022 0915   K 4.3 07/22/2023 1336   CL 114 (H) 07/22/2023 1336   CO2 17 (L) 07/22/2023 1336   BUN 61 (H) 07/22/2023 1336   BUN 49 (H) 07/14/2022 0915   CREATININE 4.58 (H) 07/22/2023 1336   CREATININE 4.47 (H) 11/22/2020 1348      Component Value Date/Time   CALCIUM  8.8 (L) 07/22/2023 1336   ALKPHOS 102 07/22/2023 1336   AST 12 (L) 07/22/2023 1336   ALT 11 07/22/2023 1336   BILITOT 0.4 07/22/2023 1336   BILITOT 0.4 07/14/2022 0915      Latest Reference Range & Units 07/22/23 13:35  Iron  45 - 182 ug/dL 97  UIBC ug/dL 782  TIBC 956 - 213 ug/dL 086  Saturation Ratios 17.9 - 39.5 % 34  Ferritin 24 - 336 ng/mL 83  Folate >5.9 ng/mL >40.0  Vitamin B12 180 - 914 pg/mL 398

## 2023-07-30 NOTE — Assessment & Plan Note (Signed)
 Patient has mild vitamin B12 deficiency with levels less than 400  - Start oral vitamin B12 1000 mcg daily

## 2023-08-07 DIAGNOSIS — N184 Chronic kidney disease, stage 4 (severe): Secondary | ICD-10-CM | POA: Diagnosis not present

## 2023-08-07 DIAGNOSIS — N179 Acute kidney failure, unspecified: Secondary | ICD-10-CM | POA: Diagnosis not present

## 2023-08-07 DIAGNOSIS — I5042 Chronic combined systolic (congestive) and diastolic (congestive) heart failure: Secondary | ICD-10-CM | POA: Diagnosis not present

## 2023-08-07 DIAGNOSIS — R809 Proteinuria, unspecified: Secondary | ICD-10-CM | POA: Diagnosis not present

## 2023-08-11 DIAGNOSIS — I429 Cardiomyopathy, unspecified: Secondary | ICD-10-CM | POA: Diagnosis not present

## 2023-08-11 DIAGNOSIS — I10 Essential (primary) hypertension: Secondary | ICD-10-CM | POA: Diagnosis not present

## 2023-08-11 DIAGNOSIS — I5022 Chronic systolic (congestive) heart failure: Secondary | ICD-10-CM | POA: Diagnosis not present

## 2023-08-11 DIAGNOSIS — M25562 Pain in left knee: Secondary | ICD-10-CM | POA: Diagnosis not present

## 2023-08-11 DIAGNOSIS — Z299 Encounter for prophylactic measures, unspecified: Secondary | ICD-10-CM | POA: Diagnosis not present

## 2023-08-17 DIAGNOSIS — R079 Chest pain, unspecified: Secondary | ICD-10-CM | POA: Diagnosis not present

## 2023-08-17 DIAGNOSIS — S39012A Strain of muscle, fascia and tendon of lower back, initial encounter: Secondary | ICD-10-CM | POA: Diagnosis not present

## 2023-08-17 DIAGNOSIS — I130 Hypertensive heart and chronic kidney disease with heart failure and stage 1 through stage 4 chronic kidney disease, or unspecified chronic kidney disease: Secondary | ICD-10-CM | POA: Diagnosis not present

## 2023-08-17 DIAGNOSIS — X58XXXA Exposure to other specified factors, initial encounter: Secondary | ICD-10-CM | POA: Diagnosis not present

## 2023-08-17 DIAGNOSIS — N189 Chronic kidney disease, unspecified: Secondary | ICD-10-CM | POA: Diagnosis not present

## 2023-08-17 DIAGNOSIS — R9389 Abnormal findings on diagnostic imaging of other specified body structures: Secondary | ICD-10-CM | POA: Diagnosis not present

## 2023-08-17 DIAGNOSIS — R110 Nausea: Secondary | ICD-10-CM | POA: Diagnosis not present

## 2023-08-17 DIAGNOSIS — Z1152 Encounter for screening for COVID-19: Secondary | ICD-10-CM | POA: Diagnosis not present

## 2023-08-17 DIAGNOSIS — I509 Heart failure, unspecified: Secondary | ICD-10-CM | POA: Diagnosis not present

## 2023-08-18 ENCOUNTER — Other Ambulatory Visit: Payer: Self-pay | Admitting: Cardiovascular Disease

## 2023-08-20 ENCOUNTER — Telehealth: Payer: Self-pay | Admitting: Cardiovascular Disease

## 2023-08-20 MED ORDER — ISOSORBIDE MONONITRATE ER 60 MG PO TB24: 60.0000 mg | ORAL_TABLET | Freq: Every day | ORAL | 3 refills | Status: DC

## 2023-08-20 NOTE — Telephone Encounter (Signed)
*  STAT* If patient is at the pharmacy, call can be transferred to refill team.   1. Which medications need to be refilled? (please list name of each medication and dose if known)   isosorbide  mononitrate (IMDUR ) 60 MG 24 hr tablet   2. Would you like to learn more about the convenience, safety, & potential cost savings by using the [facility]?   3. Are you open to using the [facility] (Type [facility]. ).  4. Which pharmacy/location (including street and city if local pharmacy) is medication to be sent to?  [facility] [phone] - [place], [place] - [address] AT [facility] [address]   5. Do they need a 30 day or 90 day supply?   90 day  Patient stated he is almost out of this medication.

## 2023-08-20 NOTE — Telephone Encounter (Signed)
 Pt's medication was sent to pt's pharmacy as requested. Confirmation received.

## 2023-08-25 ENCOUNTER — Telehealth: Payer: Self-pay | Admitting: Cardiovascular Disease

## 2023-08-25 NOTE — Telephone Encounter (Signed)
*  STAT* If patient is at the pharmacy, call can be transferred to refill team.   1. Which medications need to be refilled? (please list name of each medication and dose if known)   isosorbide  mononitrate (IMDUR ) 60 MG 24 hr tablet   2. Would you like to learn more about the convenience, safety, & potential cost savings by using the [facility]?   3. Are you open to using the [facility] (Type [facility]. ).  4. Which pharmacy/location (including street and city if local pharmacy) is medication to be sent to?  [facility] [phone] - [place], [place] - [address] AT [facility] OF [address]   5. Do they need a 30 day or 90 day supply?   90 day  Patient stated he is completely out of this medication.  Patient stated his pharmacy has not received this prescription and wants the prescription re-sent.

## 2023-09-14 DIAGNOSIS — D631 Anemia in chronic kidney disease: Secondary | ICD-10-CM | POA: Diagnosis not present

## 2023-09-14 DIAGNOSIS — E211 Secondary hyperparathyroidism, not elsewhere classified: Secondary | ICD-10-CM | POA: Diagnosis not present

## 2023-09-14 DIAGNOSIS — N185 Chronic kidney disease, stage 5: Secondary | ICD-10-CM | POA: Diagnosis not present

## 2023-09-14 DIAGNOSIS — R809 Proteinuria, unspecified: Secondary | ICD-10-CM | POA: Diagnosis not present

## 2023-09-14 DIAGNOSIS — N189 Chronic kidney disease, unspecified: Secondary | ICD-10-CM | POA: Diagnosis not present

## 2023-09-18 DIAGNOSIS — D631 Anemia in chronic kidney disease: Secondary | ICD-10-CM | POA: Diagnosis not present

## 2023-09-18 DIAGNOSIS — I5042 Chronic combined systolic (congestive) and diastolic (congestive) heart failure: Secondary | ICD-10-CM | POA: Diagnosis not present

## 2023-09-18 DIAGNOSIS — I129 Hypertensive chronic kidney disease with stage 1 through stage 4 chronic kidney disease, or unspecified chronic kidney disease: Secondary | ICD-10-CM | POA: Diagnosis not present

## 2023-09-18 DIAGNOSIS — N184 Chronic kidney disease, stage 4 (severe): Secondary | ICD-10-CM | POA: Diagnosis not present

## 2023-09-22 DIAGNOSIS — R262 Difficulty in walking, not elsewhere classified: Secondary | ICD-10-CM | POA: Diagnosis not present

## 2023-09-22 DIAGNOSIS — M25562 Pain in left knee: Secondary | ICD-10-CM | POA: Diagnosis not present

## 2023-09-23 ENCOUNTER — Other Ambulatory Visit: Payer: Self-pay | Admitting: *Deleted

## 2023-09-23 DIAGNOSIS — N186 End stage renal disease: Secondary | ICD-10-CM

## 2023-09-28 DIAGNOSIS — M25562 Pain in left knee: Secondary | ICD-10-CM | POA: Diagnosis not present

## 2023-09-28 DIAGNOSIS — R262 Difficulty in walking, not elsewhere classified: Secondary | ICD-10-CM | POA: Diagnosis not present

## 2023-10-05 DIAGNOSIS — M25562 Pain in left knee: Secondary | ICD-10-CM | POA: Diagnosis not present

## 2023-10-05 DIAGNOSIS — R262 Difficulty in walking, not elsewhere classified: Secondary | ICD-10-CM | POA: Diagnosis not present

## 2023-10-11 ENCOUNTER — Other Ambulatory Visit: Payer: Self-pay | Admitting: Cardiovascular Disease

## 2023-10-12 DIAGNOSIS — M25562 Pain in left knee: Secondary | ICD-10-CM | POA: Diagnosis not present

## 2023-10-12 DIAGNOSIS — R262 Difficulty in walking, not elsewhere classified: Secondary | ICD-10-CM | POA: Diagnosis not present

## 2023-10-13 ENCOUNTER — Other Ambulatory Visit

## 2023-10-13 ENCOUNTER — Encounter: Admitting: Vascular Surgery

## 2023-10-13 ENCOUNTER — Encounter

## 2023-10-13 DIAGNOSIS — R52 Pain, unspecified: Secondary | ICD-10-CM | POA: Diagnosis not present

## 2023-10-13 DIAGNOSIS — Z299 Encounter for prophylactic measures, unspecified: Secondary | ICD-10-CM | POA: Diagnosis not present

## 2023-10-13 DIAGNOSIS — I4891 Unspecified atrial fibrillation: Secondary | ICD-10-CM | POA: Diagnosis not present

## 2023-10-13 DIAGNOSIS — I5022 Chronic systolic (congestive) heart failure: Secondary | ICD-10-CM | POA: Diagnosis not present

## 2023-10-13 DIAGNOSIS — I10 Essential (primary) hypertension: Secondary | ICD-10-CM | POA: Diagnosis not present

## 2023-10-13 DIAGNOSIS — M109 Gout, unspecified: Secondary | ICD-10-CM | POA: Diagnosis not present

## 2023-10-13 DIAGNOSIS — N184 Chronic kidney disease, stage 4 (severe): Secondary | ICD-10-CM | POA: Diagnosis not present

## 2023-10-14 ENCOUNTER — Other Ambulatory Visit (HOSPITAL_COMMUNITY): Payer: Self-pay | Admitting: *Deleted

## 2023-10-14 DIAGNOSIS — M25531 Pain in right wrist: Secondary | ICD-10-CM

## 2023-10-18 DIAGNOSIS — R1084 Generalized abdominal pain: Secondary | ICD-10-CM | POA: Diagnosis not present

## 2023-10-18 DIAGNOSIS — I252 Old myocardial infarction: Secondary | ICD-10-CM | POA: Diagnosis not present

## 2023-10-18 DIAGNOSIS — R9389 Abnormal findings on diagnostic imaging of other specified body structures: Secondary | ICD-10-CM | POA: Diagnosis not present

## 2023-10-18 DIAGNOSIS — I509 Heart failure, unspecified: Secondary | ICD-10-CM | POA: Diagnosis not present

## 2023-10-18 DIAGNOSIS — R0602 Shortness of breath: Secondary | ICD-10-CM | POA: Diagnosis not present

## 2023-10-18 DIAGNOSIS — F200 Paranoid schizophrenia: Secondary | ICD-10-CM | POA: Diagnosis not present

## 2023-10-18 DIAGNOSIS — I130 Hypertensive heart and chronic kidney disease with heart failure and stage 1 through stage 4 chronic kidney disease, or unspecified chronic kidney disease: Secondary | ICD-10-CM | POA: Diagnosis not present

## 2023-10-18 DIAGNOSIS — N189 Chronic kidney disease, unspecified: Secondary | ICD-10-CM | POA: Diagnosis not present

## 2023-10-20 DIAGNOSIS — I5022 Chronic systolic (congestive) heart failure: Secondary | ICD-10-CM | POA: Diagnosis not present

## 2023-10-20 DIAGNOSIS — I10 Essential (primary) hypertension: Secondary | ICD-10-CM | POA: Diagnosis not present

## 2023-10-20 DIAGNOSIS — Z299 Encounter for prophylactic measures, unspecified: Secondary | ICD-10-CM | POA: Diagnosis not present

## 2023-10-20 DIAGNOSIS — N185 Chronic kidney disease, stage 5: Secondary | ICD-10-CM | POA: Diagnosis not present

## 2023-10-27 ENCOUNTER — Inpatient Hospital Stay

## 2023-10-29 ENCOUNTER — Ambulatory Visit (HOSPITAL_COMMUNITY)
Admission: RE | Admit: 2023-10-29 | Discharge: 2023-10-29 | Disposition: A | Discharge status: Home / Self Care | Source: Ambulatory Visit | Attending: *Deleted | Admitting: *Deleted

## 2023-10-29 DIAGNOSIS — M25531 Pain in right wrist: Secondary | ICD-10-CM | POA: Insufficient documentation

## 2023-10-29 DIAGNOSIS — M25562 Pain in left knee: Secondary | ICD-10-CM | POA: Diagnosis not present

## 2023-10-29 DIAGNOSIS — M65941 Unspecified synovitis and tenosynovitis, right hand: Secondary | ICD-10-CM | POA: Diagnosis not present

## 2023-10-29 DIAGNOSIS — R262 Difficulty in walking, not elsewhere classified: Secondary | ICD-10-CM | POA: Diagnosis not present

## 2023-10-29 DIAGNOSIS — R609 Edema, unspecified: Secondary | ICD-10-CM | POA: Diagnosis not present

## 2023-11-03 ENCOUNTER — Ambulatory Visit: Admitting: Physician Assistant

## 2023-11-03 DIAGNOSIS — R262 Difficulty in walking, not elsewhere classified: Secondary | ICD-10-CM | POA: Diagnosis not present

## 2023-11-03 DIAGNOSIS — M25562 Pain in left knee: Secondary | ICD-10-CM | POA: Diagnosis not present

## 2023-11-05 ENCOUNTER — Encounter: Admitting: Internal Medicine

## 2023-11-05 ENCOUNTER — Inpatient Hospital Stay: Admitting: Physician Assistant

## 2023-11-05 NOTE — Progress Notes (Deleted)
 Office Visit Note  Patient: Glenn Hawkins             Date of Birth: 20-Jun-1955           MRN: [id number]             PCP: Rosamond Leta NOVAK, MD Referring: Rosamond Leta NOVAK, MD Visit Date: 11/05/2023 Occupation: Data Unavailable  Subjective:  No chief complaint on file.   History of Present Illness: Glenn Hawkins is a 68 y.o. male ***     Activities of Daily Living:  Patient reports morning stiffness for *** {minute/hour:19697}.   Patient {ACTIONS;DENIES/REPORTS:21021675::Denies} nocturnal pain.  Difficulty dressing/grooming: {ACTIONS;DENIES/REPORTS:21021675::Denies} Difficulty climbing stairs: {ACTIONS;DENIES/REPORTS:21021675::Denies} Difficulty getting out of chair: {ACTIONS;DENIES/REPORTS:21021675::Denies} Difficulty using hands for taps, buttons, cutlery, and/or writing: {ACTIONS;DENIES/REPORTS:21021675::Denies}  No Rheumatology ROS completed.   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   Acute respiratory failure with hypoxia (HCC) 05/06/2022   Elevated troponin 05/06/2022   Iron  deficiency anemia 05/06/2022   History of DVT (deep vein thrombosis) 05/06/2022   Chronic anticoagulation 05/06/2022   Thrombocytopenia (HCC) 05/06/2022   Paranoid schizophrenia (HCC) 05/06/2022   Unintentional weight loss 11/08/2020   Pancreatic lesion 11/08/2020   Increased ammonia level 11/08/2020   Abdominal pain 11/08/2020   Diarrhea 11/08/2020   Indigestion 11/08/2020   Abnormality of abdominal aorta 11/08/2020   Hyperlipidemia LDL goal <70    Hemorrhoids  04/18/2015   Colon polyps 04/18/2015   Rectal bleed 11/08/2014   CKD (chronic kidney disease) stage 3, GFR 30-59 ml/min (HCC) 11/08/2014   Hyperglycemia 11/08/2014   GERD without esophagitis 11/08/2014   Obesity 11/08/2014   Rectal bleeding 11/08/2014   Acute deep vein thrombosis (DVT) of femoral vein of right lower extremity (HCC) 10/18/2014   Chronic systolic heart failure (HCC) 12/09/2013   Cardiomyopathy, ischemic 06/07/2013   Mitral valve disorder    Coronary atherosclerosis of native coronary artery    Hypertension    Hyperlipidemia     Past Medical History:  Diagnosis Date   Aortic insufficiency    a. 08/2019 Echo: mild to mod AI.   C. 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 02/2011 Late presentation MI-->Myoview  w/ large area of transmural infarct in LCX distribution, no ischemia.   Depressive disorder, not elsewhere classified    Dyspnea    Esophageal reflux    Gout, unspecified    Heart murmur    Mitral valve   Hemorrhoids    a. 01/2016 s/p hemorrhoidectomy.   HFrEF (heart failure with reduced ejection fraction) (HCC)    a. 04/2012 Echo: EF 40%, inflat AK, Gr1 DD, mild AI/MR, triv TR; b. 11/2017 EchoP EF 30-35%, inflat HK; c. 08/2019 Echo: EF 30-35%, gr1 DD, inflat AK.   History of DVT (deep vein thrombosis)    a. Chronic Eliquis .   Hyperlipidemia LDL goal <70    Hypertension  Hypotension, unspecified    Ischemic cardiomyopathy    a. 04/2012 Echo: EF 40%, inflat AK, Gr1 DD, mild AI/MR, triv TR; b. 11/2017 EchoP EF 30-35%, inflat HK; c. 08/2019 Echo: EF 30-35%, gr1 DD, inflat AK. Nl RV size/fxn. Mild MR. Mild to mod AI.   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 04/2012 Echo: EF 40%, mild MR.   Myocardial infarction (lateral wall) (HCC) 2013   a. 02/2011 - late  presentation. Managed conservatively 2/2 CKD;  b. 02/2011 Myoview : Large transmural infarct in the LCX distribution, no ischemia.   PAD (peripheral artery disease) (HCC)    a. 08/2015 ABI/duplex: R: 0.86, L 0.96. Duplex w/ bilateral heterogeneous plaque in mid fem arteries, no significant stenoses; b. 08/2019 ABI/Duplex: prob bilat inflow dzs w/ stable ABIs (R 0.85, L 0.93).   Personal history of tobacco use, presenting hazards to health    Torn ligament    Unspecified schizophrenia, unspecified condition     Family History  Problem Relation Age of Onset   Crohn's disease Sister    Colon cancer Neg Hx    Past Surgical History:  Procedure Laterality Date   BIOPSY  12/17/2020   Procedure: BIOPSY;  Surgeon: Cindie Carlin POUR, DO;  Location: [facility];  Service: Endoscopy;;   COLONOSCOPY     COLONOSCOPY N/A 11/10/2014   Procedure: COLONOSCOPY;  Surgeon: Elspeth Deward Naval, MD;  Location: [facility];  Service: Gastroenterology;  Laterality: N/A;   COLONOSCOPY WITH PROPOFOL  N/A 05/08/2015   Surgeon: Margo LITTIE Haddock, MD; nonthrombosed external hemorrhoids, one 8 mm tubular adenoma, one 4 mm tubular adenoma.  Repeat colonoscopy in 5-10 years.   COLONOSCOPY WITH PROPOFOL  N/A 12/17/2020   Procedure: COLONOSCOPY WITH PROPOFOL ;  Surgeon: Cindie Carlin POUR, DO;  Location: [facility];  Service: Endoscopy;  Laterality: N/A;  8:30am   ESOPHAGOGASTRODUODENOSCOPY (EGD) WITH PROPOFOL  N/A 05/08/2015   Surgeon: Margo LITTIE Haddock, MD; LA grade a reflux esophagitis, normal stomach and duodenum.   ESOPHAGOGASTRODUODENOSCOPY (EGD) WITH PROPOFOL  N/A 12/17/2020   Procedure: ESOPHAGOGASTRODUODENOSCOPY (EGD) WITH PROPOFOL ;  Surgeon: Cindie Carlin POUR, DO;  Location: [facility];  Service: Endoscopy;  Laterality: N/A;   EYE SURGERY Right    removal of foreign body   HEMORRHOID SURGERY N/A 02/20/2016   Procedure: EXTENSIVE HEMORRHOIDECTOMY;  Surgeon: Oneil Budge, MD;  Location: [facility];  Service: General;   Laterality: N/A;   None     POLYPECTOMY  05/08/2015   Procedure: POLYPECTOMY;  Surgeon: Margo LITTIE Haddock, MD;  Location: [facility];  Service: Endoscopy;;  transverse colon polyp   RIGHT/LEFT HEART CATH AND CORONARY ANGIOGRAPHY N/A 07/03/2022   Procedure: RIGHT/LEFT HEART CATH AND CORONARY ANGIOGRAPHY;  Surgeon: Wonda Sharper, MD;  [facility]: [facility] INVASIVE CV LAB;  Service: Cardiovascular;  Laterality: N/A;   TEE WITHOUT CARDIOVERSION N/A 05/26/2022   Procedure: TRANSESOPHAGEAL ECHOCARDIOGRAM (TEE);  Surgeon: Delford Maude BROCKS, MD;  Location: [facility] ENDOSCOPY;  Service: Cardiovascular;  Laterality: N/A;   TEE WITHOUT CARDIOVERSION N/A 07/16/2022   Procedure: TRANSESOPHAGEAL ECHOCARDIOGRAM;  Surgeon: Wonda Sharper, MD;  Location: [facility] INVASIVE CV LAB;  Service: Cardiovascular;  Laterality: N/A;   TRANSCATHETER MITRAL EDGE TO EDGE REPAIR N/A 07/16/2022   Procedure: MITRAL VALVE REPAIR;  Surgeon: Wonda Sharper, MD;  Location: [facility] INVASIVE CV LAB;  Service: Cardiovascular;  Laterality: N/A;   Social History   Tobacco Use   Smoking status: Former    Current packs/day: 0.00    Average packs/day: 1 pack/day for 23.0 years (23.0 ttl pk-yrs)  Types: Cigarettes    Start date: 02/24/1973    Quit date: 02/25/1996    Years since quitting: 27.7    Passive exposure: Never   Smokeless tobacco: Never   Tobacco comments:    + 15 years of smoking  Vaping Use   Vaping status: Never Used  Substance Use Topics   Alcohol  use: No    Comment: Daily alcohol  use 20+ years ago   Drug use: Not Currently    Types: Cocaine    Comment: History of intranasal cocaine and speed 20+ years ago.   Social History   Social History Narrative   Not on file     Immunization History  Administered Date(s) Administered   INFLUENZA, HIGH DOSE SEASONAL PF 12/08/2022   Influenza,inj,Quad PF,6+ Mos 11/29/2015, 12/05/2016, 12/02/2019   Influenza-Unspecified 02/26/1999, 02/05/2000, 02/08/2002, 12/17/2002, 12/26/2002, 12/31/2007,  12/26/2011, 11/24/2013, 10/26/2014, 01/06/2019, 10/25/2020   PNEUMOCOCCAL CONJUGATE-20 05/09/2022     Objective: Vital Signs: There were no vitals taken for this visit.   Physical Exam   Musculoskeletal Exam: ***  CDAI Exam: CDAI Score: -- Patient Global: --; Provider Global: -- Swollen: --; Tender: -- Joint Exam 11/05/2023   No joint exam has been documented for this visit   There is currently no information documented on the homunculus. Go to the Rheumatology activity and complete the homunculus joint exam.  Investigation: No additional findings.  Imaging: No results found.  Recent Labs: Lab Results  Component Value Date   WBC 5.9 07/22/2023   HGB 11.8 (L) 07/22/2023   PLT 105 (L) 07/22/2023   NA 140 07/22/2023   K 4.3 07/22/2023   CL 114 (H) 07/22/2023   CO2 17 (L) 07/22/2023   GLUCOSE 125 (H) 07/22/2023   BUN 61 (H) 07/22/2023   CREATININE 4.58 (H) 07/22/2023   BILITOT 0.4 07/22/2023   ALKPHOS 102 07/22/2023   AST 12 (L) 07/22/2023   ALT 11 07/22/2023   PROT 6.8 07/22/2023   ALBUMIN  3.7 07/22/2023   CALCIUM  8.8 (L) 07/22/2023   GFRAA 25 (L) 08/02/2019    Speciality Comments: No specialty comments available.  Procedures:  No procedures performed Allergies: Nifedipine, Aciphex [rabeprazole sodium], Benazepril, Haloperidol, and Prilosec [omeprazole]   Assessment / Plan:     Visit Diagnoses: No diagnosis found.  Orders: No orders of the defined types were placed in this encounter.  No orders of the defined types were placed in this encounter.   Face-to-face time spent with patient was *** minutes. Greater than 50% of time was spent in counseling and coordination of care.  Follow-Up Instructions: No follow-ups on file.   Lonni LELON Ester, MD  Note - This record has been created using [employer].  Chart creation errors have been sought, but may not always  have been located. Such creation errors do not reflect on  the standard of medical  care.

## 2023-11-09 DIAGNOSIS — M79672 Pain in left foot: Secondary | ICD-10-CM | POA: Diagnosis not present

## 2023-11-09 DIAGNOSIS — L609 Nail disorder, unspecified: Secondary | ICD-10-CM | POA: Diagnosis not present

## 2023-11-09 DIAGNOSIS — M79675 Pain in left toe(s): Secondary | ICD-10-CM | POA: Diagnosis not present

## 2023-11-09 DIAGNOSIS — R262 Difficulty in walking, not elsewhere classified: Secondary | ICD-10-CM | POA: Diagnosis not present

## 2023-11-09 DIAGNOSIS — M79674 Pain in right toe(s): Secondary | ICD-10-CM | POA: Diagnosis not present

## 2023-11-09 DIAGNOSIS — M79671 Pain in right foot: Secondary | ICD-10-CM | POA: Diagnosis not present

## 2023-11-09 DIAGNOSIS — M25562 Pain in left knee: Secondary | ICD-10-CM | POA: Diagnosis not present

## 2023-11-09 DIAGNOSIS — L110 Acquired keratosis follicularis: Secondary | ICD-10-CM | POA: Diagnosis not present

## 2023-11-09 DIAGNOSIS — I739 Peripheral vascular disease, unspecified: Secondary | ICD-10-CM | POA: Diagnosis not present

## 2023-11-16 DIAGNOSIS — R262 Difficulty in walking, not elsewhere classified: Secondary | ICD-10-CM | POA: Diagnosis not present

## 2023-11-16 DIAGNOSIS — M25562 Pain in left knee: Secondary | ICD-10-CM | POA: Diagnosis not present

## 2023-11-20 DIAGNOSIS — R52 Pain, unspecified: Secondary | ICD-10-CM | POA: Diagnosis not present

## 2023-11-20 DIAGNOSIS — I10 Essential (primary) hypertension: Secondary | ICD-10-CM | POA: Diagnosis not present

## 2023-11-20 DIAGNOSIS — F200 Paranoid schizophrenia: Secondary | ICD-10-CM | POA: Diagnosis not present

## 2023-11-20 DIAGNOSIS — I5022 Chronic systolic (congestive) heart failure: Secondary | ICD-10-CM | POA: Diagnosis not present

## 2023-11-20 DIAGNOSIS — M10031 Idiopathic gout, right wrist: Secondary | ICD-10-CM | POA: Diagnosis not present

## 2023-11-20 DIAGNOSIS — Z299 Encounter for prophylactic measures, unspecified: Secondary | ICD-10-CM | POA: Diagnosis not present

## 2023-11-23 DIAGNOSIS — Z299 Encounter for prophylactic measures, unspecified: Secondary | ICD-10-CM | POA: Diagnosis not present

## 2023-11-23 DIAGNOSIS — I4891 Unspecified atrial fibrillation: Secondary | ICD-10-CM | POA: Diagnosis not present

## 2023-11-23 DIAGNOSIS — R1900 Intra-abdominal and pelvic swelling, mass and lump, unspecified site: Secondary | ICD-10-CM | POA: Diagnosis not present

## 2023-11-23 DIAGNOSIS — I5022 Chronic systolic (congestive) heart failure: Secondary | ICD-10-CM | POA: Diagnosis not present

## 2023-11-23 DIAGNOSIS — I10 Essential (primary) hypertension: Secondary | ICD-10-CM | POA: Diagnosis not present

## 2023-11-23 DIAGNOSIS — N185 Chronic kidney disease, stage 5: Secondary | ICD-10-CM | POA: Diagnosis not present

## 2023-11-25 DIAGNOSIS — R809 Proteinuria, unspecified: Secondary | ICD-10-CM | POA: Diagnosis not present

## 2023-11-25 DIAGNOSIS — N186 End stage renal disease: Secondary | ICD-10-CM | POA: Diagnosis not present

## 2023-11-25 DIAGNOSIS — I130 Hypertensive heart and chronic kidney disease with heart failure and stage 1 through stage 4 chronic kidney disease, or unspecified chronic kidney disease: Secondary | ICD-10-CM | POA: Diagnosis not present

## 2023-11-25 DIAGNOSIS — N185 Chronic kidney disease, stage 5: Secondary | ICD-10-CM | POA: Diagnosis not present

## 2023-11-25 DIAGNOSIS — Z992 Dependence on renal dialysis: Secondary | ICD-10-CM | POA: Diagnosis not present

## 2023-11-25 DIAGNOSIS — N179 Acute kidney failure, unspecified: Secondary | ICD-10-CM | POA: Diagnosis not present

## 2023-11-26 DIAGNOSIS — R262 Difficulty in walking, not elsewhere classified: Secondary | ICD-10-CM | POA: Diagnosis not present

## 2023-11-26 DIAGNOSIS — M25562 Pain in left knee: Secondary | ICD-10-CM | POA: Diagnosis not present

## 2023-11-30 ENCOUNTER — Other Ambulatory Visit (HOSPITAL_COMMUNITY)
Admission: RE | Admit: 2023-11-30 | Discharge: 2023-11-30 | Disposition: A | Discharge status: Home / Self Care | Source: Ambulatory Visit | Attending: Nurse Practitioner | Admitting: Nurse Practitioner

## 2023-11-30 DIAGNOSIS — N1330 Unspecified hydronephrosis: Secondary | ICD-10-CM | POA: Diagnosis not present

## 2023-11-30 DIAGNOSIS — N184 Chronic kidney disease, stage 4 (severe): Secondary | ICD-10-CM | POA: Diagnosis not present

## 2023-11-30 LAB — RENAL FUNCTION PANEL
Albumin: 3.8 g/dL (ref 3.5–5.0)
Anion gap: 10 (ref 5–15)
BUN: 38 mg/dL — ABNORMAL HIGH (ref 8–23)
CO2: 26 mmol/L (ref 22–32)
Calcium: 8.8 mg/dL — ABNORMAL LOW (ref 8.9–10.3)
Chloride: 108 mmol/L (ref 98–111)
Creatinine, Ser: 4.19 mg/dL — ABNORMAL HIGH (ref 0.61–1.24)
GFR, Estimated: 15 mL/min — ABNORMAL LOW (ref >60)
Glucose, Bld: 124 mg/dL — ABNORMAL HIGH (ref 70–99)
Phosphorus: 2.9 mg/dL (ref 2.5–4.6)
Potassium: 4 mmol/L (ref 3.5–5.1)
Sodium: 144 mmol/L (ref 135–145)

## 2023-12-03 DIAGNOSIS — R0600 Dyspnea, unspecified: Secondary | ICD-10-CM | POA: Diagnosis not present

## 2023-12-03 DIAGNOSIS — R609 Edema, unspecified: Secondary | ICD-10-CM | POA: Diagnosis not present

## 2023-12-03 DIAGNOSIS — I509 Heart failure, unspecified: Secondary | ICD-10-CM | POA: Diagnosis not present

## 2023-12-03 DIAGNOSIS — F200 Paranoid schizophrenia: Secondary | ICD-10-CM | POA: Diagnosis not present

## 2023-12-03 DIAGNOSIS — I110 Hypertensive heart disease with heart failure: Secondary | ICD-10-CM | POA: Diagnosis not present

## 2023-12-03 DIAGNOSIS — Z7982 Long term (current) use of aspirin: Secondary | ICD-10-CM | POA: Diagnosis not present

## 2023-12-03 DIAGNOSIS — Z79899 Other long term (current) drug therapy: Secondary | ICD-10-CM | POA: Diagnosis not present

## 2023-12-03 DIAGNOSIS — I252 Old myocardial infarction: Secondary | ICD-10-CM | POA: Diagnosis not present

## 2023-12-04 DIAGNOSIS — I429 Cardiomyopathy, unspecified: Secondary | ICD-10-CM | POA: Diagnosis not present

## 2023-12-04 DIAGNOSIS — I5022 Chronic systolic (congestive) heart failure: Secondary | ICD-10-CM | POA: Diagnosis not present

## 2023-12-04 DIAGNOSIS — Z299 Encounter for prophylactic measures, unspecified: Secondary | ICD-10-CM | POA: Diagnosis not present

## 2023-12-04 DIAGNOSIS — N185 Chronic kidney disease, stage 5: Secondary | ICD-10-CM | POA: Diagnosis not present

## 2023-12-04 DIAGNOSIS — I10 Essential (primary) hypertension: Secondary | ICD-10-CM | POA: Diagnosis not present

## 2023-12-09 DIAGNOSIS — R262 Difficulty in walking, not elsewhere classified: Secondary | ICD-10-CM | POA: Diagnosis not present

## 2023-12-09 DIAGNOSIS — M25562 Pain in left knee: Secondary | ICD-10-CM | POA: Diagnosis not present

## 2023-12-10 DIAGNOSIS — N281 Cyst of kidney, acquired: Secondary | ICD-10-CM | POA: Diagnosis not present

## 2023-12-10 DIAGNOSIS — R1084 Generalized abdominal pain: Secondary | ICD-10-CM | POA: Diagnosis not present

## 2023-12-13 DIAGNOSIS — D649 Anemia, unspecified: Secondary | ICD-10-CM | POA: Diagnosis not present

## 2023-12-13 DIAGNOSIS — I120 Hypertensive chronic kidney disease with stage 5 chronic kidney disease or end stage renal disease: Secondary | ICD-10-CM | POA: Diagnosis not present

## 2023-12-13 DIAGNOSIS — Z79899 Other long term (current) drug therapy: Secondary | ICD-10-CM | POA: Diagnosis not present

## 2023-12-13 DIAGNOSIS — R5383 Other fatigue: Secondary | ICD-10-CM | POA: Diagnosis not present

## 2023-12-13 DIAGNOSIS — Z7901 Long term (current) use of anticoagulants: Secondary | ICD-10-CM | POA: Diagnosis not present

## 2023-12-13 DIAGNOSIS — N179 Acute kidney failure, unspecified: Secondary | ICD-10-CM | POA: Diagnosis not present

## 2023-12-13 DIAGNOSIS — N189 Chronic kidney disease, unspecified: Secondary | ICD-10-CM | POA: Diagnosis not present

## 2023-12-13 DIAGNOSIS — R350 Frequency of micturition: Secondary | ICD-10-CM | POA: Diagnosis not present

## 2023-12-13 DIAGNOSIS — Z7982 Long term (current) use of aspirin: Secondary | ICD-10-CM | POA: Diagnosis not present

## 2023-12-15 DIAGNOSIS — I4891 Unspecified atrial fibrillation: Secondary | ICD-10-CM | POA: Diagnosis not present

## 2023-12-15 DIAGNOSIS — E211 Secondary hyperparathyroidism, not elsewhere classified: Secondary | ICD-10-CM | POA: Diagnosis not present

## 2023-12-15 DIAGNOSIS — I25118 Atherosclerotic heart disease of native coronary artery with other forms of angina pectoris: Secondary | ICD-10-CM | POA: Diagnosis not present

## 2023-12-15 DIAGNOSIS — N189 Chronic kidney disease, unspecified: Secondary | ICD-10-CM | POA: Diagnosis not present

## 2023-12-15 DIAGNOSIS — M109 Gout, unspecified: Secondary | ICD-10-CM | POA: Diagnosis not present

## 2023-12-15 DIAGNOSIS — Z299 Encounter for prophylactic measures, unspecified: Secondary | ICD-10-CM | POA: Diagnosis not present

## 2023-12-15 DIAGNOSIS — I10 Essential (primary) hypertension: Secondary | ICD-10-CM | POA: Diagnosis not present

## 2023-12-15 DIAGNOSIS — E611 Iron deficiency: Secondary | ICD-10-CM | POA: Diagnosis not present

## 2023-12-15 DIAGNOSIS — I429 Cardiomyopathy, unspecified: Secondary | ICD-10-CM | POA: Diagnosis not present

## 2023-12-15 DIAGNOSIS — R809 Proteinuria, unspecified: Secondary | ICD-10-CM | POA: Diagnosis not present

## 2023-12-15 DIAGNOSIS — N185 Chronic kidney disease, stage 5: Secondary | ICD-10-CM | POA: Diagnosis not present

## 2023-12-15 DIAGNOSIS — D631 Anemia in chronic kidney disease: Secondary | ICD-10-CM | POA: Diagnosis not present

## 2023-12-17 ENCOUNTER — Other Ambulatory Visit (HOSPITAL_COMMUNITY): Payer: Self-pay | Admitting: Nurse Practitioner

## 2023-12-17 DIAGNOSIS — N184 Chronic kidney disease, stage 4 (severe): Secondary | ICD-10-CM

## 2023-12-17 DIAGNOSIS — N132 Hydronephrosis with renal and ureteral calculous obstruction: Secondary | ICD-10-CM

## 2023-12-18 ENCOUNTER — Ambulatory Visit (HOSPITAL_COMMUNITY)
Admission: RE | Admit: 2023-12-18 | Discharge: 2023-12-18 | Disposition: A | Discharge status: Home / Self Care | Source: Ambulatory Visit | Attending: Nurse Practitioner | Admitting: Nurse Practitioner

## 2023-12-18 DIAGNOSIS — N184 Chronic kidney disease, stage 4 (severe): Secondary | ICD-10-CM | POA: Insufficient documentation

## 2023-12-18 DIAGNOSIS — N132 Hydronephrosis with renal and ureteral calculous obstruction: Secondary | ICD-10-CM | POA: Insufficient documentation

## 2023-12-21 ENCOUNTER — Other Ambulatory Visit (HOSPITAL_COMMUNITY)
Admission: RE | Admit: 2023-12-21 | Discharge: 2023-12-21 | Disposition: A | Discharge status: Home / Self Care | Source: Ambulatory Visit | Attending: Nephrology | Admitting: Nephrology

## 2023-12-21 DIAGNOSIS — R809 Proteinuria, unspecified: Secondary | ICD-10-CM | POA: Diagnosis not present

## 2023-12-21 DIAGNOSIS — N185 Chronic kidney disease, stage 5: Secondary | ICD-10-CM | POA: Insufficient documentation

## 2023-12-21 DIAGNOSIS — N179 Acute kidney failure, unspecified: Secondary | ICD-10-CM | POA: Diagnosis not present

## 2023-12-21 DIAGNOSIS — I130 Hypertensive heart and chronic kidney disease with heart failure and stage 1 through stage 4 chronic kidney disease, or unspecified chronic kidney disease: Secondary | ICD-10-CM | POA: Diagnosis not present

## 2023-12-21 LAB — HEPATITIS B SURFACE ANTIBODY,QUALITATIVE: Hep B S Ab: NONREACTIVE

## 2023-12-21 LAB — HEPATITIS B SURFACE ANTIGEN: Hepatitis B Surface Ag: NONREACTIVE

## 2023-12-21 LAB — HEPATITIS C ANTIBODY: HCV Ab: NONREACTIVE

## 2023-12-22 ENCOUNTER — Encounter (HOSPITAL_COMMUNITY): Payer: Self-pay

## 2023-12-22 LAB — HEPATITIS B CORE ANTIBODY, TOTAL: HEP B CORE AB: NEGATIVE

## 2023-12-22 LAB — HCV INTERPRETATION

## 2023-12-22 LAB — HCV AB W REFLEX TO QUANT PCR: HCV Ab: NONREACTIVE

## 2023-12-23 ENCOUNTER — Ambulatory Visit (HOSPITAL_COMMUNITY)
Admission: RE | Admit: 2023-12-23 | Discharge: 2023-12-23 | Disposition: A | Discharge status: Home / Self Care | Attending: Vascular Surgery | Admitting: Vascular Surgery

## 2023-12-23 ENCOUNTER — Encounter (HOSPITAL_COMMUNITY): Payer: Self-pay | Admitting: Vascular Surgery

## 2023-12-23 ENCOUNTER — Other Ambulatory Visit: Payer: Self-pay

## 2023-12-23 ENCOUNTER — Encounter (HOSPITAL_COMMUNITY): Admission: RE | Disposition: A | Payer: Self-pay | Source: Home / Self Care | Attending: Vascular Surgery

## 2023-12-23 DIAGNOSIS — N186 End stage renal disease: Secondary | ICD-10-CM | POA: Insufficient documentation

## 2023-12-23 DIAGNOSIS — I132 Hypertensive heart and chronic kidney disease with heart failure and with stage 5 chronic kidney disease, or end stage renal disease: Secondary | ICD-10-CM | POA: Diagnosis not present

## 2023-12-23 DIAGNOSIS — Z87891 Personal history of nicotine dependence: Secondary | ICD-10-CM | POA: Diagnosis not present

## 2023-12-23 DIAGNOSIS — Z992 Dependence on renal dialysis: Secondary | ICD-10-CM | POA: Diagnosis not present

## 2023-12-23 DIAGNOSIS — N183 Chronic kidney disease, stage 3 unspecified: Secondary | ICD-10-CM

## 2023-12-23 DIAGNOSIS — I5042 Chronic combined systolic (congestive) and diastolic (congestive) heart failure: Secondary | ICD-10-CM | POA: Insufficient documentation

## 2023-12-23 MED ORDER — HEPARIN SODIUM (PORCINE) 1000 UNIT/ML IJ SOLN
INTRAMUSCULAR | Status: AC
  Filled 2023-12-23: qty 10

## 2023-12-23 MED ORDER — FENTANYL CITRATE (PF) 100 MCG/2ML IJ SOLN
INTRAMUSCULAR | Status: DC | PRN
  Administered 2023-12-23: 50 ug via INTRAVENOUS

## 2023-12-23 MED ORDER — FENTANYL CITRATE (PF) 100 MCG/2ML IJ SOLN
INTRAMUSCULAR | Status: AC
  Filled 2023-12-23: qty 2

## 2023-12-23 MED ORDER — HEPARIN SODIUM (PORCINE) 1000 UNIT/ML IJ SOLN
INTRAMUSCULAR | Status: DC | PRN
  Administered 2023-12-23 (×2): 1600 [IU] via INTRAVENOUS

## 2023-12-23 MED ORDER — LIDOCAINE HCL (PF) 1 % IJ SOLN
INTRAMUSCULAR | Status: DC | PRN
  Administered 2023-12-23: 15 mL via SUBCUTANEOUS

## 2023-12-23 MED ORDER — HEPARIN (PORCINE) IN NACL 1000-0.9 UT/500ML-% IV SOLN
INTRAVENOUS | Status: DC | PRN
  Administered 2023-12-23: 500 mL

## 2023-12-23 MED ORDER — MIDAZOLAM HCL (PF) 2 MG/2ML IJ SOLN
INTRAMUSCULAR | Status: DC | PRN
  Administered 2023-12-23: 1 mg via INTRAVENOUS

## 2023-12-23 MED ORDER — MIDAZOLAM HCL 2 MG/2ML IJ SOLN
INTRAMUSCULAR | Status: AC
  Filled 2023-12-23: qty 2

## 2023-12-23 NOTE — Op Note (Signed)
    Patient name: Glenn Hawkins MRN: [id number] DOB: [birth year] Sex: male  12/23/2023 Pre-operative Diagnosis: ESRD Post-operative diagnosis:  Same Surgeon:  Norman GORMAN Serve, MD Procedure Performed:  Ultrasound and fluoroscopic guided right internal jugular tunneled dialysis catheter placement 13 minutes of moderate sedation with fentanyl  and Versed   Indications: Mr. [name] is a 68 year old male with ESRD who is presented to the HD access center for tunneled dialysis catheter placement.  He is starting dialysis tomorrow.  He denied any previous lines or ports.  He is unsure if he he has been referred to our office for upper extremity access.  We reviewed the risks and benefits of tunneled dialysis catheter placement and he agreed to proceed.  I explained that I would also send a message to get him an appointment with our office for vein mapping and discussion of upper extremity access.  Findings:  Compressible and patent right internal jugular Successful TDC placement at atriocaval junction   Procedure:   The patient was brought to the operating room positioned supine on operating room table.  The neck was prepped and draped in the usual sterile fashion.  Using ultrasound guidance the right internal jugular vein was accessed with micropuncture technique.  Fentanyl  and Versed  were then given.  Through the micropuncture sheath, the guidewire was advanced into the superior vena cava.  A small incision was made around the skin access point.  The access point was serially dilated under direct fluoroscopic guidance.  A peel-away sheath was introduced into the superior vena cava under fluoroscopic guidance.  A counterincision was made in the chest under the clavicle.  A 19 cm tunnel dialysis catheter was then tunneled under the skin, over the clavicle into the incision in the neck.  The tunneling device was removed and the catheter fed through the peel-away sheath into the superior vena cava.  The  peel-away sheath was removed and the catheter gently pulled back.  Adequate position was confirmed with x-ray.  The catheter was tested and found to aspirate and flush with ease.    The catheter was sutured to the skin and the neck incision was closed with 4-0 Monocryl.  The catheter was then capped and heparin  locked.   Contrast: None Sedation: 13 minutes  Impression: Successful right IJ TDC placement Will have follow-up in our office with vein mapping to discuss upper extremity access   Norman GORMAN Serve MD [facility] Office: [phone]

## 2023-12-23 NOTE — H&P (Signed)
 HD ACCESS CENTER H&P   Patient ID: Glenn Hawkins, male   DOB: 1955/06/10, 68 y.o.   MRN: [id number]  Subjective:     HPI Glenn Hawkins is a 68 y.o. male with ESRD presenting to the HD access center for intervention.  Past Medical History:  Diagnosis Date   Aortic insufficiency    a. 08/2019 Echo: mild to mod AI.   C. 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 02/2011 Late presentation MI-->Myoview  w/ large area of transmural infarct in LCX distribution, no ischemia.   Depressive disorder, not elsewhere classified    Dyspnea    Esophageal reflux    Gout, unspecified    Heart murmur    Mitral valve   Hemorrhoids    a. 01/2016 s/p hemorrhoidectomy.   HFrEF (heart failure with reduced ejection fraction) (HCC)    a. 04/2012 Echo: EF 40%, inflat AK, Gr1 DD, mild AI/MR, triv TR; b. 11/2017 EchoP EF 30-35%, inflat HK; c. 08/2019 Echo: EF 30-35%, gr1 DD, inflat AK.   History of DVT (deep vein thrombosis)    a. Chronic Eliquis .   Hyperlipidemia LDL goal <70    Hypertension    Hypotension, unspecified    Ischemic cardiomyopathy    a. 04/2012 Echo: EF 40%, inflat AK, Gr1 DD, mild AI/MR, triv TR; b. 11/2017 EchoP EF 30-35%, inflat HK; c. 08/2019 Echo: EF 30-35%, gr1 DD, inflat AK. Nl RV size/fxn. Mild MR. Mild to mod AI.   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 04/2012 Echo: EF 40%, mild MR.   Myocardial infarction (lateral wall) (HCC) 2013   a. 02/2011 - late presentation. Managed conservatively 2/2 CKD;  b. 02/2011 Myoview : Large transmural infarct in the LCX distribution, no ischemia.   PAD (peripheral artery disease)    a. 08/2015 ABI/duplex: R: 0.86, L 0.96. Duplex w/ bilateral heterogeneous plaque in mid fem arteries, no significant stenoses; b. 08/2019 ABI/Duplex: prob bilat inflow dzs w/  stable ABIs (R 0.85, L 0.93).   Personal history of tobacco use, presenting hazards to health    Torn ligament    Unspecified schizophrenia, unspecified condition    Family History  Problem Relation Age of Onset   Crohn's disease Sister    Colon cancer Neg Hx    Past Surgical History:  Procedure Laterality Date   BIOPSY  12/17/2020   Procedure: BIOPSY;  Surgeon: Cindie Carlin POUR, DO;  Location: [facility];  Service: Endoscopy;;   COLONOSCOPY     COLONOSCOPY N/A 11/10/2014   Procedure: COLONOSCOPY;  Surgeon: Elspeth Deward Naval, MD;  Location: [facility] ENDOSCOPY;  Service: Gastroenterology;  Laterality: N/A;   COLONOSCOPY WITH PROPOFOL  N/A 05/08/2015   Surgeon: Margo LITTIE Haddock, MD; nonthrombosed external hemorrhoids, one 8 mm tubular adenoma, one 4 mm tubular adenoma.  Repeat colonoscopy in 5-10 years.   COLONOSCOPY WITH PROPOFOL  N/A 12/17/2020   Procedure: COLONOSCOPY WITH PROPOFOL ;  Surgeon: Cindie Carlin POUR, DO;  Location: [facility];  Service: Endoscopy;  Laterality: N/A;  8:30am   ESOPHAGOGASTRODUODENOSCOPY (EGD) WITH PROPOFOL  N/A 05/08/2015   Surgeon: Margo LITTIE Haddock, MD; LA grade a reflux esophagitis, normal stomach and duodenum.   ESOPHAGOGASTRODUODENOSCOPY (EGD) WITH PROPOFOL  N/A 12/17/2020   Procedure: ESOPHAGOGASTRODUODENOSCOPY (EGD) WITH PROPOFOL ;  Surgeon: Cindie Carlin POUR, DO;  Location:  [facility];  Service: Endoscopy;  Laterality: N/A;   EYE SURGERY Right    removal of foreign body   HEMORRHOID SURGERY N/A 02/20/2016   Procedure: EXTENSIVE HEMORRHOIDECTOMY;  Surgeon: Oneil Budge, MD;  Location: [facility] ORS;  Service: General;  Laterality: N/A;   None     POLYPECTOMY  05/08/2015   Procedure: POLYPECTOMY;  Surgeon: Margo LITTIE Haddock, MD;  Location: [facility];  Service: Endoscopy;;  transverse colon polyp   RIGHT/LEFT HEART CATH AND CORONARY ANGIOGRAPHY N/A 07/03/2022   Procedure: RIGHT/LEFT HEART CATH AND CORONARY ANGIOGRAPHY;  Surgeon: Wonda Sharper, MD;  Location:  [facility] INVASIVE CV LAB;  Service: Cardiovascular;  Laterality: N/A;   TEE WITHOUT CARDIOVERSION N/A 05/26/2022   Procedure: TRANSESOPHAGEAL ECHOCARDIOGRAM (TEE);  Surgeon: Delford Maude BROCKS, MD;  Location: [facility] ENDOSCOPY;  Service: Cardiovascular;  Laterality: N/A;   TEE WITHOUT CARDIOVERSION N/A 07/16/2022   Procedure: TRANSESOPHAGEAL ECHOCARDIOGRAM;  Surgeon: Wonda Sharper, MD;  Location: [facility] INVASIVE CV LAB;  Service: Cardiovascular;  Laterality: N/A;   TRANSCATHETER MITRAL EDGE TO EDGE REPAIR N/A 07/16/2022   Procedure: MITRAL VALVE REPAIR;  Surgeon: Wonda Sharper, MD;  Location: [facility] INVASIVE CV LAB;  Service: Cardiovascular;  Laterality: N/A;    Short Social History:  Social History   Tobacco Use   Smoking status: Former    Current packs/day: 0.00    Average packs/day: 1 pack/day for 23.0 years (23.0 ttl pk-yrs)    Types: Cigarettes    Start date: 02/24/1973    Quit date: 02/25/1996    Years since quitting: 27.8    Passive exposure: Never   Smokeless tobacco: Never   Tobacco comments:    + 15 years of smoking  Substance Use Topics   Alcohol  use: No    Comment: 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    REVIEW OF SYSTEMS All other systems were reviewed and are negative     Objective:   Objective   Vitals:   12/23/23 0806  BP: (!) 146/98  Pulse: 76  Resp: 12  Temp: 98.5 F (36.9 C)  TempSrc: Oral  SpO2: 94%  Weight: 93 kg  Height: 5' 8 (1.727 m)   Body mass index is 31.17 kg/m.  Physical Exam General: no acute distress Cardiac: hemodynamically stable     Assessment/Plan:   Glenn Hawkins is a 68 y.o. male with ESRD presenting for tunneled dialysis catheter.  He denies any  previous lines reports.  He reports starting dialysis tomorrow.  He is unsure if he has been referred to our office for upper extremity access.  Reviewed risks and benefits of tunneled dialysis catheter placement and patient agreed to proceed.   Norman Serve, MD [facility]

## 2023-12-24 DIAGNOSIS — E039 Hypothyroidism, unspecified: Secondary | ICD-10-CM | POA: Diagnosis not present

## 2023-12-24 DIAGNOSIS — Z992 Dependence on renal dialysis: Secondary | ICD-10-CM | POA: Diagnosis not present

## 2023-12-24 DIAGNOSIS — N186 End stage renal disease: Secondary | ICD-10-CM | POA: Diagnosis not present

## 2023-12-24 DIAGNOSIS — N2581 Secondary hyperparathyroidism of renal origin: Secondary | ICD-10-CM | POA: Diagnosis not present

## 2023-12-24 LAB — QUANTIFERON-TB GOLD PLUS (RQFGPL)
QuantiFERON Mitogen Value: 2.14 [IU]/mL
QuantiFERON Nil Value: 0.04 [IU]/mL
QuantiFERON TB1 Ag Value: 0.04 [IU]/mL
QuantiFERON TB2 Ag Value: 0.04 [IU]/mL

## 2023-12-24 LAB — QUANTIFERON-TB GOLD PLUS: QuantiFERON-TB Gold Plus: NEGATIVE

## 2023-12-25 ENCOUNTER — Other Ambulatory Visit: Payer: Self-pay | Admitting: *Deleted

## 2023-12-25 DIAGNOSIS — N186 End stage renal disease: Secondary | ICD-10-CM

## 2023-12-26 DIAGNOSIS — Z992 Dependence on renal dialysis: Secondary | ICD-10-CM | POA: Diagnosis not present

## 2023-12-26 DIAGNOSIS — Z23 Encounter for immunization: Secondary | ICD-10-CM | POA: Diagnosis not present

## 2023-12-26 DIAGNOSIS — R52 Pain, unspecified: Secondary | ICD-10-CM | POA: Diagnosis not present

## 2023-12-26 DIAGNOSIS — N186 End stage renal disease: Secondary | ICD-10-CM | POA: Diagnosis not present

## 2023-12-26 DIAGNOSIS — D509 Iron deficiency anemia, unspecified: Secondary | ICD-10-CM | POA: Diagnosis not present

## 2023-12-26 DIAGNOSIS — N2581 Secondary hyperparathyroidism of renal origin: Secondary | ICD-10-CM | POA: Diagnosis not present

## 2023-12-29 DIAGNOSIS — N186 End stage renal disease: Secondary | ICD-10-CM | POA: Diagnosis not present

## 2023-12-29 DIAGNOSIS — Z992 Dependence on renal dialysis: Secondary | ICD-10-CM | POA: Diagnosis not present

## 2023-12-29 DIAGNOSIS — N2581 Secondary hyperparathyroidism of renal origin: Secondary | ICD-10-CM | POA: Diagnosis not present

## 2023-12-29 DIAGNOSIS — R52 Pain, unspecified: Secondary | ICD-10-CM | POA: Diagnosis not present

## 2023-12-29 DIAGNOSIS — Z23 Encounter for immunization: Secondary | ICD-10-CM | POA: Diagnosis not present

## 2023-12-29 DIAGNOSIS — D509 Iron deficiency anemia, unspecified: Secondary | ICD-10-CM | POA: Diagnosis not present

## 2023-12-31 DIAGNOSIS — D509 Iron deficiency anemia, unspecified: Secondary | ICD-10-CM | POA: Diagnosis not present

## 2023-12-31 DIAGNOSIS — Z23 Encounter for immunization: Secondary | ICD-10-CM | POA: Diagnosis not present

## 2023-12-31 DIAGNOSIS — Z992 Dependence on renal dialysis: Secondary | ICD-10-CM | POA: Diagnosis not present

## 2023-12-31 DIAGNOSIS — N2581 Secondary hyperparathyroidism of renal origin: Secondary | ICD-10-CM | POA: Diagnosis not present

## 2023-12-31 DIAGNOSIS — R52 Pain, unspecified: Secondary | ICD-10-CM | POA: Diagnosis not present

## 2023-12-31 DIAGNOSIS — N186 End stage renal disease: Secondary | ICD-10-CM | POA: Diagnosis not present

## 2024-01-02 DIAGNOSIS — N2581 Secondary hyperparathyroidism of renal origin: Secondary | ICD-10-CM | POA: Diagnosis not present

## 2024-01-02 DIAGNOSIS — Z23 Encounter for immunization: Secondary | ICD-10-CM | POA: Diagnosis not present

## 2024-01-02 DIAGNOSIS — R52 Pain, unspecified: Secondary | ICD-10-CM | POA: Diagnosis not present

## 2024-01-02 DIAGNOSIS — N186 End stage renal disease: Secondary | ICD-10-CM | POA: Diagnosis not present

## 2024-01-02 DIAGNOSIS — D509 Iron deficiency anemia, unspecified: Secondary | ICD-10-CM | POA: Diagnosis not present

## 2024-01-02 DIAGNOSIS — Z992 Dependence on renal dialysis: Secondary | ICD-10-CM | POA: Diagnosis not present

## 2024-01-04 ENCOUNTER — Other Ambulatory Visit: Payer: Self-pay | Admitting: Cardiovascular Disease

## 2024-01-05 DIAGNOSIS — Z23 Encounter for immunization: Secondary | ICD-10-CM | POA: Diagnosis not present

## 2024-01-05 DIAGNOSIS — Z992 Dependence on renal dialysis: Secondary | ICD-10-CM | POA: Diagnosis not present

## 2024-01-05 DIAGNOSIS — N2581 Secondary hyperparathyroidism of renal origin: Secondary | ICD-10-CM | POA: Diagnosis not present

## 2024-01-05 DIAGNOSIS — R52 Pain, unspecified: Secondary | ICD-10-CM | POA: Diagnosis not present

## 2024-01-05 DIAGNOSIS — D509 Iron deficiency anemia, unspecified: Secondary | ICD-10-CM | POA: Diagnosis not present

## 2024-01-05 DIAGNOSIS — N186 End stage renal disease: Secondary | ICD-10-CM | POA: Diagnosis not present

## 2024-01-06 MED ORDER — FUROSEMIDE 40 MG PO TABS: 40.0000 mg | ORAL_TABLET | Freq: Every day | ORAL | 2 refills | Status: AC | PRN

## 2024-01-06 MED ORDER — ISOSORBIDE MONONITRATE ER 60 MG PO TB24: 60.0000 mg | ORAL_TABLET | Freq: Every day | ORAL | 2 refills | Status: AC

## 2024-01-07 DIAGNOSIS — N2581 Secondary hyperparathyroidism of renal origin: Secondary | ICD-10-CM | POA: Diagnosis not present

## 2024-01-07 DIAGNOSIS — R52 Pain, unspecified: Secondary | ICD-10-CM | POA: Diagnosis not present

## 2024-01-07 DIAGNOSIS — D509 Iron deficiency anemia, unspecified: Secondary | ICD-10-CM | POA: Diagnosis not present

## 2024-01-07 DIAGNOSIS — Z23 Encounter for immunization: Secondary | ICD-10-CM | POA: Diagnosis not present

## 2024-01-07 DIAGNOSIS — N186 End stage renal disease: Secondary | ICD-10-CM | POA: Diagnosis not present

## 2024-01-07 DIAGNOSIS — Z992 Dependence on renal dialysis: Secondary | ICD-10-CM | POA: Diagnosis not present

## 2024-01-09 DIAGNOSIS — R52 Pain, unspecified: Secondary | ICD-10-CM | POA: Diagnosis not present

## 2024-01-09 DIAGNOSIS — Z23 Encounter for immunization: Secondary | ICD-10-CM | POA: Diagnosis not present

## 2024-01-09 DIAGNOSIS — D509 Iron deficiency anemia, unspecified: Secondary | ICD-10-CM | POA: Diagnosis not present

## 2024-01-09 DIAGNOSIS — N186 End stage renal disease: Secondary | ICD-10-CM | POA: Diagnosis not present

## 2024-01-09 DIAGNOSIS — Z992 Dependence on renal dialysis: Secondary | ICD-10-CM | POA: Diagnosis not present

## 2024-01-09 DIAGNOSIS — N2581 Secondary hyperparathyroidism of renal origin: Secondary | ICD-10-CM | POA: Diagnosis not present

## 2024-01-12 DIAGNOSIS — Z992 Dependence on renal dialysis: Secondary | ICD-10-CM | POA: Diagnosis not present

## 2024-01-12 DIAGNOSIS — N2581 Secondary hyperparathyroidism of renal origin: Secondary | ICD-10-CM | POA: Diagnosis not present

## 2024-01-12 DIAGNOSIS — D509 Iron deficiency anemia, unspecified: Secondary | ICD-10-CM | POA: Diagnosis not present

## 2024-01-12 DIAGNOSIS — Z23 Encounter for immunization: Secondary | ICD-10-CM | POA: Diagnosis not present

## 2024-01-12 DIAGNOSIS — R52 Pain, unspecified: Secondary | ICD-10-CM | POA: Diagnosis not present

## 2024-01-12 DIAGNOSIS — N186 End stage renal disease: Secondary | ICD-10-CM | POA: Diagnosis not present

## 2024-01-14 DIAGNOSIS — Z23 Encounter for immunization: Secondary | ICD-10-CM | POA: Diagnosis not present

## 2024-01-14 DIAGNOSIS — N186 End stage renal disease: Secondary | ICD-10-CM | POA: Diagnosis not present

## 2024-01-14 DIAGNOSIS — N2581 Secondary hyperparathyroidism of renal origin: Secondary | ICD-10-CM | POA: Diagnosis not present

## 2024-01-14 DIAGNOSIS — D509 Iron deficiency anemia, unspecified: Secondary | ICD-10-CM | POA: Diagnosis not present

## 2024-01-14 DIAGNOSIS — Z992 Dependence on renal dialysis: Secondary | ICD-10-CM | POA: Diagnosis not present

## 2024-01-14 DIAGNOSIS — R52 Pain, unspecified: Secondary | ICD-10-CM | POA: Diagnosis not present

## 2024-01-15 DIAGNOSIS — M109 Gout, unspecified: Secondary | ICD-10-CM | POA: Diagnosis not present

## 2024-01-15 DIAGNOSIS — I4891 Unspecified atrial fibrillation: Secondary | ICD-10-CM | POA: Diagnosis not present

## 2024-01-15 DIAGNOSIS — M1711 Unilateral primary osteoarthritis, right knee: Secondary | ICD-10-CM | POA: Diagnosis not present

## 2024-01-15 DIAGNOSIS — I10 Essential (primary) hypertension: Secondary | ICD-10-CM | POA: Diagnosis not present

## 2024-01-15 DIAGNOSIS — Z299 Encounter for prophylactic measures, unspecified: Secondary | ICD-10-CM | POA: Diagnosis not present

## 2024-01-15 DIAGNOSIS — R52 Pain, unspecified: Secondary | ICD-10-CM | POA: Diagnosis not present

## 2024-01-15 DIAGNOSIS — M79604 Pain in right leg: Secondary | ICD-10-CM | POA: Diagnosis not present

## 2024-01-16 DIAGNOSIS — Z992 Dependence on renal dialysis: Secondary | ICD-10-CM | POA: Diagnosis not present

## 2024-01-16 DIAGNOSIS — R52 Pain, unspecified: Secondary | ICD-10-CM | POA: Diagnosis not present

## 2024-01-16 DIAGNOSIS — Z23 Encounter for immunization: Secondary | ICD-10-CM | POA: Diagnosis not present

## 2024-01-16 DIAGNOSIS — N2581 Secondary hyperparathyroidism of renal origin: Secondary | ICD-10-CM | POA: Diagnosis not present

## 2024-01-16 DIAGNOSIS — D509 Iron deficiency anemia, unspecified: Secondary | ICD-10-CM | POA: Diagnosis not present

## 2024-01-16 DIAGNOSIS — N186 End stage renal disease: Secondary | ICD-10-CM | POA: Diagnosis not present

## 2024-01-18 DIAGNOSIS — Z23 Encounter for immunization: Secondary | ICD-10-CM | POA: Diagnosis not present

## 2024-01-18 DIAGNOSIS — Z992 Dependence on renal dialysis: Secondary | ICD-10-CM | POA: Diagnosis not present

## 2024-01-18 DIAGNOSIS — N2581 Secondary hyperparathyroidism of renal origin: Secondary | ICD-10-CM | POA: Diagnosis not present

## 2024-01-18 DIAGNOSIS — R52 Pain, unspecified: Secondary | ICD-10-CM | POA: Diagnosis not present

## 2024-01-18 DIAGNOSIS — D509 Iron deficiency anemia, unspecified: Secondary | ICD-10-CM | POA: Diagnosis not present

## 2024-01-18 DIAGNOSIS — N186 End stage renal disease: Secondary | ICD-10-CM | POA: Diagnosis not present

## 2024-01-20 ENCOUNTER — Ambulatory Visit (HOSPITAL_COMMUNITY)

## 2024-01-20 ENCOUNTER — Ambulatory Visit: Admitting: Vascular Surgery

## 2024-01-20 DIAGNOSIS — Z23 Encounter for immunization: Secondary | ICD-10-CM | POA: Diagnosis not present

## 2024-01-20 DIAGNOSIS — N186 End stage renal disease: Secondary | ICD-10-CM | POA: Diagnosis not present

## 2024-01-20 DIAGNOSIS — R52 Pain, unspecified: Secondary | ICD-10-CM | POA: Diagnosis not present

## 2024-01-20 DIAGNOSIS — N2581 Secondary hyperparathyroidism of renal origin: Secondary | ICD-10-CM | POA: Diagnosis not present

## 2024-01-20 DIAGNOSIS — Z992 Dependence on renal dialysis: Secondary | ICD-10-CM | POA: Diagnosis not present

## 2024-01-20 DIAGNOSIS — D509 Iron deficiency anemia, unspecified: Secondary | ICD-10-CM | POA: Diagnosis not present

## 2024-01-23 DIAGNOSIS — N186 End stage renal disease: Secondary | ICD-10-CM | POA: Diagnosis not present

## 2024-01-23 DIAGNOSIS — R52 Pain, unspecified: Secondary | ICD-10-CM | POA: Diagnosis not present

## 2024-01-23 DIAGNOSIS — N2581 Secondary hyperparathyroidism of renal origin: Secondary | ICD-10-CM | POA: Diagnosis not present

## 2024-01-23 DIAGNOSIS — Z23 Encounter for immunization: Secondary | ICD-10-CM | POA: Diagnosis not present

## 2024-01-23 DIAGNOSIS — D509 Iron deficiency anemia, unspecified: Secondary | ICD-10-CM | POA: Diagnosis not present

## 2024-01-23 DIAGNOSIS — Z992 Dependence on renal dialysis: Secondary | ICD-10-CM | POA: Diagnosis not present

## 2024-01-24 ENCOUNTER — Encounter (HOSPITAL_COMMUNITY): Payer: Self-pay

## 2024-01-24 ENCOUNTER — Emergency Department (HOSPITAL_COMMUNITY)
Admission: EM | Admit: 2024-01-24 | Discharge: 2024-01-24 | Disposition: A | Discharge status: Home / Self Care | Attending: Emergency Medicine | Admitting: Emergency Medicine

## 2024-01-24 ENCOUNTER — Emergency Department (HOSPITAL_COMMUNITY)

## 2024-01-24 ENCOUNTER — Other Ambulatory Visit: Payer: Self-pay

## 2024-01-24 DIAGNOSIS — R9431 Abnormal electrocardiogram [ECG] [EKG]: Secondary | ICD-10-CM | POA: Diagnosis not present

## 2024-01-24 DIAGNOSIS — D72829 Elevated white blood cell count, unspecified: Secondary | ICD-10-CM | POA: Diagnosis not present

## 2024-01-24 DIAGNOSIS — N186 End stage renal disease: Secondary | ICD-10-CM | POA: Diagnosis not present

## 2024-01-24 DIAGNOSIS — R197 Diarrhea, unspecified: Secondary | ICD-10-CM | POA: Insufficient documentation

## 2024-01-24 DIAGNOSIS — E876 Hypokalemia: Secondary | ICD-10-CM | POA: Insufficient documentation

## 2024-01-24 DIAGNOSIS — R112 Nausea with vomiting, unspecified: Secondary | ICD-10-CM | POA: Diagnosis not present

## 2024-01-24 DIAGNOSIS — Z7901 Long term (current) use of anticoagulants: Secondary | ICD-10-CM | POA: Diagnosis not present

## 2024-01-24 DIAGNOSIS — Z992 Dependence on renal dialysis: Secondary | ICD-10-CM | POA: Diagnosis not present

## 2024-01-24 DIAGNOSIS — R1110 Vomiting, unspecified: Secondary | ICD-10-CM | POA: Diagnosis not present

## 2024-01-24 DIAGNOSIS — R1084 Generalized abdominal pain: Secondary | ICD-10-CM | POA: Diagnosis not present

## 2024-01-24 LAB — CBC
HCT: 37.2 % — ABNORMAL LOW (ref 39.0–52.0)
Hemoglobin: 11.6 g/dL — ABNORMAL LOW (ref 13.0–17.0)
MCH: 30.9 pg (ref 26.0–34.0)
MCHC: 31.2 g/dL (ref 30.0–36.0)
MCV: 99.2 fL (ref 80.0–100.0)
Platelets: 127 K/uL — ABNORMAL LOW (ref 150–400)
RBC: 3.75 MIL/uL — ABNORMAL LOW (ref 4.22–5.81)
RDW: 14.8 % (ref 11.5–15.5)
WBC: 11.6 K/uL — ABNORMAL HIGH (ref 4.0–10.5)
nRBC: 0 % (ref 0.0–0.2)

## 2024-01-24 LAB — COMPREHENSIVE METABOLIC PANEL WITH GFR
ALT: 20 U/L (ref 0–44)
AST: 17 U/L (ref 15–41)
Albumin: 3.3 g/dL — ABNORMAL LOW (ref 3.5–5.0)
Alkaline Phosphatase: 81 U/L (ref 38–126)
Anion gap: 13 (ref 5–15)
BUN: 29 mg/dL — ABNORMAL HIGH (ref 8–23)
CO2: 26 mmol/L (ref 22–32)
Calcium: 8.3 mg/dL — ABNORMAL LOW (ref 8.9–10.3)
Chloride: 101 mmol/L (ref 98–111)
Creatinine, Ser: 5.28 mg/dL — ABNORMAL HIGH (ref 0.61–1.24)
GFR, Estimated: 11 mL/min — ABNORMAL LOW (ref >60)
Glucose, Bld: 121 mg/dL — ABNORMAL HIGH (ref 70–99)
Potassium: 3.4 mmol/L — ABNORMAL LOW (ref 3.5–5.1)
Sodium: 140 mmol/L (ref 135–145)
Total Bilirubin: 0.6 mg/dL (ref 0.0–1.2)
Total Protein: 6.7 g/dL (ref 6.5–8.1)

## 2024-01-24 LAB — LIPASE, BLOOD: Lipase: 51 U/L (ref 11–51)

## 2024-01-24 MED ORDER — ONDANSETRON 4 MG PO TBDP: 4.0000 mg | ORAL_TABLET | Freq: Three times a day (TID) | ORAL | 0 refills | Status: AC | PRN

## 2024-01-24 NOTE — ED Triage Notes (Signed)
 Pt c.o generalized abd pain with vomiting that started yesterday after eating at a steakhouse. Pt also c.o slight SOB. Pt receives dialysis normally on T/TH/Sat but his thanksgiving schedule this past week was M/W/Sat, last dialysis was yesterday.

## 2024-01-24 NOTE — ED Provider Notes (Signed)
 [place] EMERGENCY DEPARTMENT AT [facility] Provider Note   CSN: [id number] Arrival date & time: 01/24/24  1430     Patient presents with: Emesis and Abdominal Pain   Glenn Hawkins is a 68 y.o. male.   Patient with history of ESRD, on dialysis for approximately 1-1/2 months --presents to the emergency department with family member today for evaluation of nausea, vomiting, and diarrhea.  Symptoms started around 3 AM, abruptly.  After several episodes of vomiting, symptoms improved.  Patient had some mild generalized abdominal pain which has resolved.  Patient had some grits this morning and did not vomit.  No known sick contacts.  No fevers.  He denies history of abdominal surgeries.  Last had dialysis yesterday, was a normal session.  No shortness of breath currently.  No lower extremity swelling.  Patient does make small amounts of urine.  No treatments prior to arrival.       Prior to Admission medications   Medication Sig Start Date End Date Taking? Authorizing Provider  acetaminophen  (TYLENOL ) 325 MG tablet Take 2 tablets (650 mg total) by mouth every 4 (four) hours as needed for fever, headache or mild pain. 09/08/22   Sherrill Cable Latif, DO  albuterol  (VENTOLIN  HFA) 108 [id number] Base) MCG/ACT inhaler SMARTSIG:2 Puff(s) By Mouth Every 4 Hours PRN 04/28/23   [provider]  amLODipine  (NORVASC ) 10 MG tablet Take 10 mg by mouth at bedtime. 11/12/22   [provider]  apixaban  (ELIQUIS ) 5 MG TABS tablet Take 2.5 mg by mouth 2 (two) times daily.    [provider]  atorvastatin  (LIPITOR ) 80 MG tablet Take 1 tablet (80 mg total) by mouth daily. Patient taking differently: Take 80 mg by mouth in the morning. 07/11/22   Rolan Ezra RAMAN, MD  brimonidine  (ALPHAGAN ) 0.2 % ophthalmic solution Place 1 drop into both eyes daily. 04/18/22   [provider]  carvedilol  (COREG ) 25 MG tablet Take 1 tablet (25 mg total) by mouth 2 (two) times daily. 07/28/23  07/27/24  Darron Deatrice LABOR, MD  colchicine  0.6 MG tablet Take 0.5 tablets (0.3 mg total) by mouth daily. Patient taking differently: Take 0.3 mg by mouth 2 (two) times daily. 07/11/22   Rolan Ezra RAMAN, MD  cyanocobalamin  (VITAMIN B12) 1000 MCG tablet Take 1 tablet (1,000 mcg total) by mouth daily. 07/30/23   Kandala, Hyndavi, MD  Daridorexant  HCl 25 MG TABS Take 25 mg by mouth at bedtime.    [provider]  DOCUSATE SODIUM  PO Take 1 capsule by mouth daily.    [provider]  esomeprazole  (NEXIUM ) 20 MG capsule Take 1 capsule (20 mg total) by mouth daily before breakfast. 11/08/20   Rudy Josette RAMAN, PA-C  ferrous sulfate  325 (65 FE) MG EC tablet Take 1 tablet (325 mg total) by mouth every other day. 03/30/23   Kandala, Hyndavi, MD  folic acid  (FOLVITE ) 1 MG tablet Take 1 tablet (1 mg total) by mouth daily. Patient taking differently: Take 1 mg by mouth at bedtime. 09/09/22   Sherrill Cable Latif, DO  furosemide  (LASIX ) 40 MG tablet Take 1 tablet (40 mg total) by mouth daily as needed for edema or fluid. 01/06/24   Darron Deatrice LABOR, MD  isosorbide  mononitrate (IMDUR ) 60 MG 24 hr tablet Take 1 tablet (60 mg total) by mouth daily. 01/06/24   Darron Deatrice LABOR, MD  melatonin 3 MG TABS tablet Take 3 mg by mouth at bedtime. 04/16/22   [provider]  OLANZapine  (ZYPREXA ) 10 MG tablet Take 5-15 mg by mouth See admin instructions. Take 5 mg in the morning and 15 mg at night 03/24/22   [provider]  potassium chloride  (MICRO-K ) 10 MEQ CR capsule Take 10 mEq by mouth 2 (two) times daily.    [provider]  potassium chloride  SA (KLOR-CON  M) 20 MEQ tablet TAKE 4 TABLETS BY MOUTH TODAY THEN TAKE 2 TABLETS BY MOUTH EVERY DAY 02/16/23   Milford, Harlene HERO, FNP  sertraline  (ZOLOFT ) 100 MG tablet Take 100 mg by mouth daily.    [provider]  sodium bicarbonate  650 MG tablet Take 650 mg by mouth 2 (two) times daily.    [provider]    Allergies:  Nifedipine, Aciphex [rabeprazole sodium], Benazepril, Haloperidol, and Prilosec [omeprazole]    Review of Systems  Updated Vital Signs BP 116/78 (BP Location: Right Arm)   Pulse 76   Temp 98.2 F (36.8 C) (Oral)   Resp 18   Ht 5' 8 (1.727 m)   Wt 90.7 kg   SpO2 100%   BMI 30.41 kg/m   Physical Exam Vitals and nursing note reviewed.  Constitutional:      General: He is not in acute distress.    Appearance: He is well-developed.  HENT:     Head: Normocephalic and atraumatic.  Eyes:     General:        Right eye: No discharge.        Left eye: No discharge.     Conjunctiva/sclera: Conjunctivae normal.  Cardiovascular:     Rate and Rhythm: Normal rate and regular rhythm.     Heart sounds: Normal heart sounds.  Pulmonary:     Effort: Pulmonary effort is normal.     Breath sounds: Normal breath sounds.  Abdominal:     Palpations: Abdomen is soft.     Tenderness: There is no abdominal tenderness.  Musculoskeletal:     Cervical back: Normal range of motion and neck supple.  Skin:    General: Skin is warm and dry.  Neurological:     Mental Status: He is alert.     (all labs ordered are listed, but only abnormal results are displayed) Labs Reviewed  COMPREHENSIVE METABOLIC PANEL WITH GFR - Abnormal; Notable for the following components:      Result Value   Potassium 3.4 (*)    Glucose, Bld 121 (*)    BUN 29 (*)    Creatinine, Ser 5.28 (*)    Calcium  8.3 (*)    Albumin  3.3 (*)    GFR, Estimated 11 (*)    All other components within normal limits  CBC - Abnormal; Notable for the following components:   WBC 11.6 (*)    RBC 3.75 (*)    Hemoglobin 11.6 (*)    HCT 37.2 (*)    Platelets 127 (*)    All other components within normal limits  LIPASE, BLOOD  URINALYSIS, ROUTINE W REFLEX MICROSCOPIC    EKG: EKG Interpretation Date/Time:  Sunday January 24 2024 14:50:25 EST Ventricular Rate:  80 PR Interval:  136 QRS Duration:  102 QT Interval:  408 QTC  Calculation: 470 R Axis:   37  Text Interpretation: Normal sinus rhythm ST & T wave abnormality, consider lateral ischemia Prolonged QT Abnormal ECG When compared with ECG of 28-Jul-2023 10:38, No significant change since last tracing Confirmed by Francesca Fallow ([id number]) on 01/24/2024 4:22:34 PM  Radiology: DG Chest 2 View Result Date: 01/24/2024 CLINICAL  DATA:  Generalized abdominal pain and vomiting beginning yesterday. Slight shortness of breath. EXAM: CHEST - 2 VIEW COMPARISON:  01/08/2023 and older studies. FINDINGS: Cardiac silhouette is normal in size. No mediastinal or hilar masses or evidence of adenopathy. Tunneled dual lumen right anterior chest wall internal jugular central venous catheter, tip projecting in the mid superior vena cava, new since the prior exam. Clear lungs.  No pleural effusion or pneumothorax. Skeletal structures are intact IMPRESSION: No active cardiopulmonary disease. Electronically Signed   By: Alm Parkins M.D.   On: 01/24/2024 15:35     Procedures   Medications Ordered in the ED - No data to display  ED Course  Patient seen and examined. History obtained directly from patient and family member at bedside. Work-up including labs, imaging, EKG ordered in triage, if performed, were reviewed.    Labs/EKG: Independently reviewed and interpreted.  This included: CBC with elevated white blood cell count at 11.6, hemoglobin slightly low at 11.6, platelets low at 127; CMP with mild hypokalemia at 3.4, creatinine and BUN are elevated consistent with ESRD status, glucose 121; lipase normal.  Patient with small amount of urine in urinalysis container.  Imaging: None ordered  Medications/Fluids: None ordered  Most recent vital signs reviewed and are as follows: BP 116/78 (BP Location: Right Arm)   Pulse 76   Temp 98.2 F (36.8 C) (Oral)   Resp 18   Ht 5' 8 (1.727 m)   Wt 90.7 kg   SpO2 100%   BMI 30.41 kg/m   Initial impression: Patient with nausea,  vomiting, and diarrhea this morning.  Some abdominal pain which has resolved.  Patient been able to eat and drink since vomiting, more than 12 hours after assessment.  Patient discussed with and seen by Dr. Francesca.   Given well appearance, improvement in symptoms, overall reassuring lab work --comfortable with discharge to home.  Patient tolerating p.o.'s.    Plan: Discharge to home.   Prescriptions written for: Zofran   Other home care instructions discussed: Benjamine diet, slow advancement  ED return instructions discussed: The patient was urged to return to the Emergency Department immediately with worsening of current symptoms, worsening abdominal pain, persistent vomiting, blood noted in stools, fever, or any other concerns. The patient verbalized understanding.   Follow-up instructions discussed: Patient encouraged to follow-up with their PCP in 2 days if not improving.                                   Medical Decision Making Amount and/or Complexity of Data Reviewed Labs: ordered. Radiology: ordered.   For this patient's complaint of abdominal pain, the following conditions were considered on the differential diagnosis: gastritis/PUD, enteritis/duodenitis, appendicitis, cholelithiasis/cholecystitis, cholangitis, pancreatitis, ruptured viscus, colitis, diverticulitis, small/large bowel obstruction, proctitis, cystitis, pyelonephritis, ureteral colic, aortic dissection, aortic aneurysm. Atypical chest etiologies were also considered including ACS, PE, and pneumonia.  Patient with reassuring exam, no focal or significant tenderness on exam.  He had short-lived bouts of nausea, vomiting, diarrhea --now clinically improving over the past 12 hours.  ESRD status, no hyperkalemia, does not appear to be fluid overloaded.  Do not feel that patient requires imaging given clinical improvement and reassuring exam.  Possible gastroenteritis but will need to monitor closely for worsening and to  ensure symptoms continue to improve as would be expected.  The patient's vital signs, pertinent lab work and imaging were reviewed and interpreted as discussed  in the ED course. Hospitalization was considered for further testing, treatments, or serial exams/observation. However as patient is well-appearing, has a stable exam, and reassuring studies today, I do not feel that they warrant admission at this time. This plan was discussed with the patient who verbalizes agreement and comfort with this plan and seems reliable and able to return to the Emergency Department with worsening or changing symptoms.       Final diagnoses:  Nausea vomiting and diarrhea    ED Discharge Orders          Ordered    ondansetron  (ZOFRAN -ODT) 4 MG disintegrating tablet  Every 8 hours PRN        01/24/24 1700               Desiderio Chew, PA-C 01/24/24 1708    Francesca Elsie CROME, MD 01/24/24 2006

## 2024-01-24 NOTE — ED Triage Notes (Signed)
 First Nurse Note: Pt with a hx of ESRD and on HD presents with abd pain, N/V/D that started this morning.

## 2024-01-24 NOTE — Discharge Instructions (Addendum)
 Please read and follow all provided instructions.  Your diagnoses today include:  1. Nausea vomiting and diarrhea     Tests performed today include: Complete blood cell count: Stress cells were little bit high Complete metabolic panel: Potassium was slightly low Lipase (pancreas function test): Was normal Vital signs. See below for your results today.   Medications prescribed:  Zofran  (ondansetron ) - for nausea and vomiting  Take any prescribed medications only as directed.  Home care instructions:  Follow any educational materials contained in this packet.  You should rest for the next several days. Keep drinking plenty of fluids and use the medicine for nausea as directed.   Drink clear liquids for the next 24 hours and introduce solid foods slowly after 24 hours using the b.r.a.t. diet (Bananas, Rice, Applesauce, Toast, Yogurt).    Follow-up instructions: Please follow-up with your primary care provider in the next 2 days for further evaluation of your symptoms. If you are not feeling better in 48 hours you may have a condition that is more serious and you need re-evaluation.   Return instructions:  SEEK IMMEDIATE MEDICAL ATTENTION IF: If you have pain that does not go away or becomes severe  A temperature above 101F develops  Repeated vomiting occurs (multiple episodes)  If you have pain that becomes localized to portions of the abdomen. The right side could possibly be appendicitis. In an adult, the left lower portion of the abdomen could be colitis or diverticulitis.  Blood is being passed in stools or vomit (bright red or black tarry stools)  You develop chest pain, difficulty breathing, dizziness or fainting, or become confused, poorly responsive, or inconsolable (young children) If you have any other emergent concerns regarding your health  Additional Information: Abdominal (belly) pain can be caused by many things. Your caregiver performed an examination and possibly  ordered blood/urine tests and imaging (CT scan, x-rays, ultrasound). Many cases can be observed and treated at home after initial evaluation in the emergency department. Even though you are being discharged home, abdominal pain can be unpredictable. Therefore, you need a repeated exam if your pain does not resolve, returns, or worsens. Most patients with abdominal pain don't have to be admitted to the hospital or have surgery, but serious problems like appendicitis and gallbladder attacks can start out as nonspecific pain. Many abdominal conditions cannot be diagnosed in one visit, so follow-up evaluations are very important.  Your vital signs today were: BP 116/78 (BP Location: Right Arm)   Pulse 76   Temp 98.2 F (36.8 C) (Oral)   Resp 18   Ht 5' 8 (1.727 m)   Wt 90.7 kg   SpO2 100%   BMI 30.41 kg/m  If your blood pressure (bp) was elevated above 135/85 this visit, please have this repeated by your doctor within one month. --------------

## 2024-01-29 ENCOUNTER — Other Ambulatory Visit: Payer: Self-pay | Admitting: Cardiovascular Disease

## 2024-02-08 ENCOUNTER — Telehealth: Payer: Self-pay | Admitting: Cardiovascular Disease

## 2024-02-08 ENCOUNTER — Encounter: Payer: Self-pay | Admitting: Cardiovascular Disease

## 2024-02-08 NOTE — Telephone Encounter (Signed)
 We have attempted to contact patient 3 times to schedule follow up appointment with Dr Darron. Recall expungement letter has been sent via MyChart and recall has been deleted per standard of work

## 2024-02-24 ENCOUNTER — Ambulatory Visit: Admitting: Vascular Surgery

## 2024-02-24 ENCOUNTER — Ambulatory Visit (HOSPITAL_COMMUNITY)

## 2024-02-29 ENCOUNTER — Encounter: Payer: Self-pay | Admitting: Oncology

## 2024-03-23 ENCOUNTER — Encounter: Payer: Self-pay | Admitting: Oncology

## 2024-03-30 ENCOUNTER — Ambulatory Visit: Admitting: Vascular Surgery

## 2024-03-30 ENCOUNTER — Ambulatory Visit (HOSPITAL_COMMUNITY)
Admission: RE | Admit: 2024-03-30 | Discharge: 2024-03-30 | Disposition: A | Source: Ambulatory Visit | Attending: Vascular Surgery

## 2024-03-30 ENCOUNTER — Encounter: Payer: Self-pay | Admitting: Oncology

## 2024-03-30 ENCOUNTER — Encounter: Payer: Self-pay | Admitting: Vascular Surgery

## 2024-03-30 VITALS — BP 89/61 | HR 86 | Temp 98.1°F | Ht 68.0 in | Wt 206.0 lb

## 2024-03-30 DIAGNOSIS — N186 End stage renal disease: Secondary | ICD-10-CM | POA: Diagnosis not present

## 2024-03-30 NOTE — Progress Notes (Signed)
 "  Patient ID: Glenn Hawkins, male   DOB: 1955-08-21, 69 y.o.   MRN: [id number]  Reason for Consult: New Patient (Initial Visit)   Referred by Rosamond Leta NOVAK, MD  Subjective:     HPI:  Glenn Hawkins is a 69 y.o. male currently on dialysis via right IJ tunneled dialysis catheter for the past 5 months.  He currently dialyzes Tuesdays Thursdays and Saturdays and is on Eliquis  for history of a DVT.  He is right-hand dominant has never had any left upper extremity, chest or breast surgery.  Past Medical History:  Diagnosis Date   Aortic insufficiency    a. 08/2019 Echo: mild to mod AI.   C. 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 02/2011 Late presentation MI-->Myoview  w/ large area of transmural infarct in LCX distribution, no ischemia.   Depressive disorder, not elsewhere classified    Dyspnea    Esophageal reflux    Gout, unspecified    Heart murmur    Mitral valve   Hemorrhoids    a. 01/2016 s/p hemorrhoidectomy.   HFrEF (heart failure with reduced ejection fraction) (HCC)    a. 04/2012 Echo: EF 40%, inflat AK, Gr1 DD, mild AI/MR, triv TR; b. 11/2017 EchoP EF 30-35%, inflat HK; c. 08/2019 Echo: EF 30-35%, gr1 DD, inflat AK.   History of DVT (deep vein thrombosis)    a. Chronic Eliquis .   Hyperlipidemia LDL goal <70    Hypertension    Hypotension, unspecified    Ischemic cardiomyopathy    a. 04/2012 Echo: EF 40%, inflat AK, Gr1 DD, mild AI/MR, triv TR; b. 11/2017 EchoP EF 30-35%, inflat HK; c. 08/2019 Echo: EF 30-35%, gr1 DD, inflat AK. Nl RV size/fxn. Mild MR. Mild to mod AI.   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 04/2012 Echo: EF 40%, mild MR.   Myocardial infarction (lateral wall) (HCC) 2013   a. 02/2011 - late presentation. Managed conservatively 2/2 CKD;  b. 02/2011 Myoview :  Large transmural infarct in the LCX distribution, no ischemia.   PAD (peripheral artery disease)    a. 08/2015 ABI/duplex: R: 0.86, L 0.96. Duplex w/ bilateral heterogeneous plaque in mid fem arteries, no significant stenoses; b. 08/2019 ABI/Duplex: prob bilat inflow dzs w/ stable ABIs (R 0.85, L 0.93).   Personal history of tobacco use, presenting hazards to health    Torn ligament    Unspecified schizophrenia, unspecified condition    Family History  Problem Relation Age of Onset   Crohn's disease Sister    Colon cancer Neg Hx    Past Surgical History:  Procedure Laterality Date   BIOPSY  12/17/2020   Procedure: BIOPSY;  Surgeon: Cindie Carlin POUR, DO;  Location: [facility];  Service: Endoscopy;;   COLONOSCOPY     COLONOSCOPY N/A 11/10/2014   Procedure: COLONOSCOPY;  Surgeon: Elspeth Deward Naval, MD;  Location: [facility] ENDOSCOPY;  Service: Gastroenterology;  Laterality: N/A;   COLONOSCOPY WITH PROPOFOL  N/A 05/08/2015   Surgeon: Margo LITTIE Haddock, MD; nonthrombosed external hemorrhoids, one 8 mm tubular adenoma, one 4 mm tubular adenoma.  Repeat colonoscopy in 5-10 years.   COLONOSCOPY WITH PROPOFOL  N/A 12/17/2020   Procedure: COLONOSCOPY WITH PROPOFOL ;  Surgeon: Cindie Carlin POUR, DO;  Location: [facility];  Service: Endoscopy;  Laterality:  N/A;  8:30am   DIALYSIS/PERMA CATHETER INSERTION N/A 12/23/2023   Procedure: DIALYSIS/PERMA CATHETER INSERTION;  Surgeon: Pearline Norman RAMAN, MD;  Location: [facility];  Service: Cardiovascular;  Laterality: N/A;   ESOPHAGOGASTRODUODENOSCOPY (EGD) WITH PROPOFOL  N/A 05/08/2015   Surgeon: Margo LITTIE Haddock, MD; LA grade a reflux esophagitis, normal stomach and duodenum.   ESOPHAGOGASTRODUODENOSCOPY (EGD) WITH PROPOFOL  N/A 12/17/2020   Procedure: ESOPHAGOGASTRODUODENOSCOPY (EGD) WITH PROPOFOL ;  Surgeon: Cindie Carlin POUR, DO;  Location: [facility];  Service: Endoscopy;  Laterality: N/A;   EYE SURGERY Right    removal of foreign body   HEMORRHOID SURGERY  N/A 02/20/2016   Procedure: EXTENSIVE HEMORRHOIDECTOMY;  Surgeon: Oneil Budge, MD;  Location: [facility];  Service: General;  Laterality: N/A;   None     POLYPECTOMY  05/08/2015   Procedure: POLYPECTOMY;  Surgeon: Margo LITTIE Haddock, MD;  Location: [facility];  Service: Endoscopy;;  transverse colon polyp   RIGHT/LEFT HEART CATH AND CORONARY ANGIOGRAPHY N/A 07/03/2022   Procedure: RIGHT/LEFT HEART CATH AND CORONARY ANGIOGRAPHY;  Surgeon: Wonda Sharper, MD;  Location: [facility] INVASIVE CV LAB;  Service: Cardiovascular;  Laterality: N/A;   TEE WITHOUT CARDIOVERSION N/A 05/26/2022   Procedure: TRANSESOPHAGEAL ECHOCARDIOGRAM (TEE);  Surgeon: Delford Maude BROCKS, MD;  Location: [facility] ENDOSCOPY;  Service: Cardiovascular;  Laterality: N/A;   TEE WITHOUT CARDIOVERSION N/A 07/16/2022   Procedure: TRANSESOPHAGEAL ECHOCARDIOGRAM;  Surgeon: Wonda Sharper, MD;  Location: [facility] INVASIVE CV LAB;  Service: Cardiovascular;  Laterality: N/A;   TRANSCATHETER MITRAL EDGE TO EDGE REPAIR N/A 07/16/2022   Procedure: MITRAL VALVE REPAIR;  Surgeon: Wonda Sharper, MD;  Location: [facility] INVASIVE CV LAB;  Service: Cardiovascular;  Laterality: N/A;    Short Social History:  Social History   Tobacco Use   Smoking status: Former    Current packs/day: 0.00    Average packs/day: 1 pack/day for 23.0 years (23.0 ttl pk-yrs)    Types: Cigarettes    Start date: 02/24/1973    Quit date: 02/25/1996    Years since quitting: 28.1    Passive exposure: Never   Smokeless tobacco: Never   Tobacco comments:    + 15 years of smoking  Substance Use Topics   Alcohol  use: No    Comment: Daily alcohol  use 20+ years ago    Allergies[1]  Current Outpatient Medications  Medication Sig Dispense Refill   acetaminophen  (TYLENOL ) 325 MG tablet Take 2 tablets (650 mg total) by mouth every 4 (four) hours as needed for fever, headache or mild pain. 20 tablet 0   albuterol  (VENTOLIN  HFA) 108 (90 Base) MCG/ACT inhaler SMARTSIG:2 Puff(s) By Mouth Every 4 Hours PRN      amLODipine  (NORVASC ) 10 MG tablet Take 10 mg by mouth at bedtime.     apixaban  (ELIQUIS ) 5 MG TABS tablet Take 2.5 mg by mouth 2 (two) times daily.     atorvastatin  (LIPITOR ) 80 MG tablet Take 1 tablet (80 mg total) by mouth daily. (Patient taking differently: Take 80 mg by mouth in the morning.) 30 tablet 3   brimonidine  (ALPHAGAN ) 0.2 % ophthalmic solution Place 1 drop into both eyes daily.     carvedilol  (COREG ) 25 MG tablet TAKE 1 TABLET(25 MG) BY MOUTH TWICE DAILY 180 tablet 0   colchicine  0.6 MG tablet Take 0.5 tablets (0.3 mg total) by mouth daily. (Patient taking differently: Take 0.3 mg by mouth 2 (two) times daily.) 30 tablet 3   cyanocobalamin  (VITAMIN B12) 1000 MCG tablet Take 1 tablet (1,000 mcg total) by mouth  daily. 90 tablet 2   Daridorexant  HCl 25 MG TABS Take 25 mg by mouth at bedtime.     DOCUSATE SODIUM  PO Take 1 capsule by mouth daily.     esomeprazole  (NEXIUM ) 20 MG capsule Take 1 capsule (20 mg total) by mouth daily before breakfast. 30 capsule 3   ferrous sulfate  325 (65 FE) MG EC tablet Take 1 tablet (325 mg total) by mouth every other day. 45 tablet 3   folic acid  (FOLVITE ) 1 MG tablet Take 1 tablet (1 mg total) by mouth daily. (Patient taking differently: Take 1 mg by mouth at bedtime.)     furosemide  (LASIX ) 40 MG tablet Take 1 tablet (40 mg total) by mouth daily as needed for edema or fluid. 90 tablet 2   isosorbide  mononitrate (IMDUR ) 60 MG 24 hr tablet Take 1 tablet (60 mg total) by mouth daily. 90 tablet 2   melatonin 3 MG TABS tablet Take 3 mg by mouth at bedtime.     OLANZapine  (ZYPREXA ) 10 MG tablet Take 5-15 mg by mouth See admin instructions. Take 5 mg in the morning and 15 mg at night     ondansetron  (ZOFRAN -ODT) 4 MG disintegrating tablet Take 1 tablet (4 mg total) by mouth every 8 (eight) hours as needed for nausea or vomiting. 10 tablet 0   potassium chloride  (MICRO-K ) 10 MEQ CR capsule Take 10 mEq by mouth 2 (two) times daily.     potassium chloride  SA  (KLOR-CON  M) 20 MEQ tablet TAKE 4 TABLETS BY MOUTH TODAY THEN TAKE 2 TABLETS BY MOUTH EVERY DAY 100 tablet 0   sertraline  (ZOLOFT ) 100 MG tablet Take 100 mg by mouth daily.     sodium bicarbonate  650 MG tablet Take 650 mg by mouth 2 (two) times daily.     No current facility-administered medications for this visit.    Review of Systems  Constitutional:  Constitutional negative. HENT: HENT negative.  Eyes: Eyes negative.  Respiratory: Respiratory negative.  Cardiovascular: Cardiovascular negative.  GI: Gastrointestinal negative.  Musculoskeletal: Musculoskeletal negative.  Neurological: Neurological negative. Hematologic: Hematologic/lymphatic negative.  Psychiatric: Psychiatric negative.        Objective:  Objective   Vitals:   03/30/24 1421  BP: (!) 89/61  Pulse: 86  Temp: 98.1 F (36.7 C)  SpO2: 97%  Weight: 206 lb (93.4 kg)  Height: 5' 8 (1.727 m)   Body mass index is 31.32 kg/m.  Physical Exam HENT:     Head: Normocephalic.     Nose: Nose normal.  Eyes:     Pupils: Pupils are equal, round, and reactive to light.  Cardiovascular:     Rate and Rhythm: Normal rate.     Pulses:          Radial pulses are 2+ on the right side and 2+ on the left side.  Abdominal:     General: Abdomen is flat.  Musculoskeletal:        General: Normal range of motion.     Right lower leg: No edema.     Left lower leg: No edema.  Skin:    General: Skin is warm.     Capillary Refill: Capillary refill takes less than 2 seconds.  Neurological:     General: No focal deficit present.     Mental Status: He is alert.  Psychiatric:        Mood and Affect: Mood normal.     Data: UPPER EXTREMITY VEIN MAPPING  Patient Name:  ANITA MCADORY  Date of Exam:   03/30/2024 Medical Rec #: [id number]       Accession #:    [id number] Date of Birth: 01-15-1956        Patient Gender: M Patient Age:   25 years Exam Location:  [address] Procedure:      VAS US  UPPER EXT VEIN MAPPING  (PRE-OP AVF) Referring Phys: PENNE Kania Regnier   --------------------------------------------------------------------------- -----   Indications: Pre-access.  Performing Technologist: Geni Lodge RVS, RCS    Examination Guidelines: A complete evaluation includes B-mode imaging, spectral Doppler, color Doppler, and power Doppler as needed of all accessible portions of each vessel. Bilateral testing is considered an integral part of a complete examination. Limited examinations for reoccurring indications may be performed as noted.  +-----------------+-------------+----------+-----------------------------+ Right Cephalic   Diameter (cm)Depth (cm)          Findings            +-----------------+-------------+----------+-----------------------------+ Shoulder             0.28                                             +-----------------+-------------+----------+-----------------------------+ Prox upper arm       0.24                                             +-----------------+-------------+----------+-----------------------------+ Mid upper arm        0.37                                             +-----------------+-------------+----------+-----------------------------+ Dist upper arm       0.30                                             +-----------------+-------------+----------+-----------------------------+ Antecubital fossa    0.49               minimal amt of focal thrombus +-----------------+-------------+----------+-----------------------------+ Prox forearm         0.23                                             +-----------------+-------------+----------+-----------------------------+ Mid forearm          0.24                                             +-----------------+-------------+----------+-----------------------------+ Dist forearm         0.23                                              +-----------------+-------------+----------+-----------------------------+  +-----------------+-------------+----------+--------+ Right Basilic    Diameter (cm)Depth (cm)Findings +-----------------+-------------+----------+--------+ Prox upper arm  0.42                        +-----------------+-------------+----------+--------+ Mid upper arm        0.54                        +-----------------+-------------+----------+--------+ Dist upper arm       0.45                        +-----------------+-------------+----------+--------+ Antecubital fossa    0.39                        +-----------------+-------------+----------+--------+  +-----------------+-------------+----------+--------+ Left Cephalic    Diameter (cm)Depth (cm)Findings +-----------------+-------------+----------+--------+ Prox upper arm       0.35                        +-----------------+-------------+----------+--------+ Mid upper arm        0.35                        +-----------------+-------------+----------+--------+ Dist upper arm       0.46                        +-----------------+-------------+----------+--------+ Antecubital fossa    0.50                        +-----------------+-------------+----------+--------+ Prox forearm         0.28                        +-----------------+-------------+----------+--------+ Mid forearm          0.27                        +-----------------+-------------+----------+--------+ Dist forearm         0.32                        +-----------------+-------------+----------+--------+  +-----------------+-------------+----------+---------+ Left Basilic     Diameter (cm)Depth (cm)Findings  +-----------------+-------------+----------+---------+ Mid upper arm        0.41                         +-----------------+-------------+----------+---------+ Dist upper arm       0.51                          +-----------------+-------------+----------+---------+ Antecubital fossa    0.35               branching +-----------------+-------------+----------+---------+ Prox forearm         0.25                         +-----------------+-------------+----------+---------+  Summary: Right: Patent and compressible cephalic and basilic veins.        Trivial amount of focal chronic thrombus in the cephalic vein        at the antecubital fossa. Left: Patent and compressible cephalic and basilic veins.       Assessment/Plan:    69 year old male with history of end-stage renal disease currently dialyzing via tunneled dialysis catheter.  He has suitable vein in the nondominant left upper extremity.  We  have discussed proceeding with fistula versus graft.  All risk benefits alternatives were discussed patient demonstrates good understanding we will schedule on a nondialysis day in the near future and he will need to hold Eliquis  72 hours prior to procedure.     Penne Lonni Colorado MD [facility]      [1]  Allergies Allergen Reactions   Nifedipine Diarrhea   Aciphex [Rabeprazole Sodium] Diarrhea   Benazepril Other (See Comments)    Unknown   Haloperidol Anxiety    Causes severe anxiety   Prilosec [Omeprazole] Diarrhea   "

## 2024-03-31 ENCOUNTER — Telehealth: Payer: Self-pay

## 2024-03-31 ENCOUNTER — Other Ambulatory Visit: Payer: Self-pay

## 2024-03-31 DIAGNOSIS — N186 End stage renal disease: Secondary | ICD-10-CM

## 2024-03-31 NOTE — Telephone Encounter (Signed)
 Attempted to call for surgery scheduling. LVM

## 2024-04-20 ENCOUNTER — Ambulatory Visit: Admitting: Cardiovascular Disease

## 2024-04-29 ENCOUNTER — Ambulatory Visit (HOSPITAL_COMMUNITY): Admit: 2024-04-29 | Admitting: Vascular Surgery
